# Patient Record
Sex: Female | Born: 1940
Health system: Southern US, Community
[De-identification: ages and names within clinical notes are randomized; demographics above are authoritative.]

## PROBLEM LIST (undated history)

## (undated) DIAGNOSIS — E785 Hyperlipidemia, unspecified: Secondary | ICD-10-CM

## (undated) DIAGNOSIS — I5189 Other ill-defined heart diseases: Secondary | ICD-10-CM

## (undated) DIAGNOSIS — I480 Paroxysmal atrial fibrillation: Secondary | ICD-10-CM

## (undated) DIAGNOSIS — K922 Gastrointestinal hemorrhage, unspecified: Secondary | ICD-10-CM

## (undated) DIAGNOSIS — D649 Anemia, unspecified: Secondary | ICD-10-CM

## (undated) DIAGNOSIS — I43 Cardiomyopathy in diseases classified elsewhere: Secondary | ICD-10-CM

## (undated) DIAGNOSIS — I809 Phlebitis and thrombophlebitis of unspecified site: Secondary | ICD-10-CM

## (undated) DIAGNOSIS — I350 Nonrheumatic aortic (valve) stenosis: Secondary | ICD-10-CM

## (undated) DIAGNOSIS — I639 Cerebral infarction, unspecified: Secondary | ICD-10-CM

## (undated) DIAGNOSIS — M159 Polyosteoarthritis, unspecified: Secondary | ICD-10-CM

## (undated) DIAGNOSIS — K219 Gastro-esophageal reflux disease without esophagitis: Secondary | ICD-10-CM

## (undated) DIAGNOSIS — H269 Unspecified cataract: Secondary | ICD-10-CM

## (undated) DIAGNOSIS — I10 Essential (primary) hypertension: Secondary | ICD-10-CM

## (undated) DIAGNOSIS — E039 Hypothyroidism, unspecified: Secondary | ICD-10-CM

## (undated) DIAGNOSIS — Z952 Presence of prosthetic heart valve: Secondary | ICD-10-CM

## (undated) DIAGNOSIS — I447 Left bundle-branch block, unspecified: Secondary | ICD-10-CM

## (undated) HISTORY — DX: Paroxysmal atrial fibrillation: I48.0

## (undated) HISTORY — PX: ABDOMINAL HYSTERECTOMY: SHX81

## (undated) HISTORY — PX: EYE SURGERY: SHX253

## (undated) HISTORY — DX: Other ill-defined heart diseases: I51.89

## (undated) HISTORY — PX: BREAST BIOPSY: SHX20

## (undated) HISTORY — DX: Nonrheumatic aortic (valve) stenosis: I35.0

## (undated) HISTORY — PX: COLONOSCOPY: SHX174

---
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
## 1999-03-16 ENCOUNTER — Ambulatory Visit (HOSPITAL_COMMUNITY): Admission: RE | Admit: 1999-03-16 | Discharge: 1999-03-16 | Payer: Self-pay | Admitting: Family Medicine

## 1999-04-12 ENCOUNTER — Ambulatory Visit (HOSPITAL_COMMUNITY): Admission: RE | Admit: 1999-04-12 | Discharge: 1999-04-12 | Payer: Self-pay | Admitting: Family Medicine

## 1999-04-12 ENCOUNTER — Encounter: Payer: Self-pay | Admitting: Family Medicine

## 2000-08-14 ENCOUNTER — Encounter: Payer: Self-pay | Admitting: Family Medicine

## 2000-08-14 ENCOUNTER — Ambulatory Visit (HOSPITAL_COMMUNITY): Admission: RE | Admit: 2000-08-14 | Discharge: 2000-08-14 | Payer: Self-pay | Admitting: Family Medicine

## 2002-01-05 ENCOUNTER — Other Ambulatory Visit: Admission: RE | Admit: 2002-01-05 | Discharge: 2002-01-05 | Payer: Self-pay | Admitting: Family Medicine

## 2002-01-15 ENCOUNTER — Ambulatory Visit (HOSPITAL_COMMUNITY): Admission: RE | Admit: 2002-01-15 | Discharge: 2002-01-15 | Payer: Self-pay | Admitting: Family Medicine

## 2002-01-15 ENCOUNTER — Encounter: Payer: Self-pay | Admitting: Family Medicine

## 2003-01-25 ENCOUNTER — Encounter: Payer: Self-pay | Admitting: Family Medicine

## 2003-01-25 ENCOUNTER — Ambulatory Visit (HOSPITAL_COMMUNITY): Admission: RE | Admit: 2003-01-25 | Discharge: 2003-01-25 | Payer: Self-pay | Admitting: Family Medicine

## 2006-10-08 DIAGNOSIS — I639 Cerebral infarction, unspecified: Secondary | ICD-10-CM

## 2006-10-08 HISTORY — DX: Cerebral infarction, unspecified: I63.9

## 2007-01-22 ENCOUNTER — Inpatient Hospital Stay (HOSPITAL_COMMUNITY): Admission: EM | Admit: 2007-01-22 | Discharge: 2007-01-27 | Payer: Self-pay | Admitting: Emergency Medicine

## 2007-01-23 ENCOUNTER — Encounter: Payer: Self-pay | Admitting: Cardiology

## 2007-02-13 ENCOUNTER — Other Ambulatory Visit: Admission: RE | Admit: 2007-02-13 | Discharge: 2007-02-13 | Payer: Self-pay | Admitting: Family Medicine

## 2007-02-19 ENCOUNTER — Ambulatory Visit (HOSPITAL_COMMUNITY): Admission: RE | Admit: 2007-02-19 | Discharge: 2007-02-19 | Payer: Self-pay | Admitting: Family Medicine

## 2007-04-27 ENCOUNTER — Emergency Department (HOSPITAL_COMMUNITY): Admission: EM | Admit: 2007-04-27 | Discharge: 2007-04-27 | Payer: Self-pay | Admitting: *Deleted

## 2007-09-09 ENCOUNTER — Emergency Department (HOSPITAL_COMMUNITY): Admission: EM | Admit: 2007-09-09 | Discharge: 2007-09-09 | Payer: Self-pay | Admitting: Emergency Medicine

## 2009-02-10 ENCOUNTER — Encounter (INDEPENDENT_AMBULATORY_CARE_PROVIDER_SITE_OTHER): Payer: Self-pay | Admitting: Cardiology

## 2009-02-10 ENCOUNTER — Ambulatory Visit (HOSPITAL_COMMUNITY): Admission: RE | Admit: 2009-02-10 | Discharge: 2009-02-10 | Payer: Self-pay | Admitting: Cardiology

## 2009-02-15 ENCOUNTER — Ambulatory Visit: Payer: Self-pay | Admitting: Surgery

## 2009-02-23 ENCOUNTER — Ambulatory Visit (HOSPITAL_COMMUNITY): Admission: RE | Admit: 2009-02-23 | Discharge: 2009-02-23 | Payer: Self-pay | Admitting: Surgery

## 2009-03-08 ENCOUNTER — Ambulatory Visit: Payer: Self-pay | Admitting: Surgery

## 2009-04-19 ENCOUNTER — Ambulatory Visit: Payer: Self-pay | Admitting: Surgery

## 2009-10-29 ENCOUNTER — Inpatient Hospital Stay (HOSPITAL_COMMUNITY): Admission: EM | Admit: 2009-10-29 | Discharge: 2009-10-31 | Payer: Self-pay | Admitting: Emergency Medicine

## 2009-10-30 ENCOUNTER — Encounter (INDEPENDENT_AMBULATORY_CARE_PROVIDER_SITE_OTHER): Payer: Self-pay | Admitting: Interventional Cardiology

## 2010-02-20 ENCOUNTER — Ambulatory Visit (HOSPITAL_COMMUNITY): Admission: RE | Admit: 2010-02-20 | Discharge: 2010-02-20 | Payer: Self-pay | Admitting: Family Medicine

## 2010-04-15 ENCOUNTER — Emergency Department (HOSPITAL_COMMUNITY): Admission: EM | Admit: 2010-04-15 | Discharge: 2010-04-16 | Payer: Self-pay | Admitting: Emergency Medicine

## 2010-10-29 ENCOUNTER — Encounter: Payer: Self-pay | Admitting: Neurology

## 2010-12-24 LAB — CBC
HCT: 36.7 % (ref 36.0–46.0)
HCT: 36.2 % (ref 36.0–46.0)
HCT: 36.2 % (ref 36.0–46.0)
Hemoglobin: 10.7 g/dL — ABNORMAL LOW (ref 12.0–15.0)
Hemoglobin: 12 g/dL (ref 12.0–15.0)
MCH: 25.7 pg — ABNORMAL LOW (ref 26.0–34.0)
MCHC: 33 g/dL (ref 30.0–36.0)
MCV: 77.8 fL — ABNORMAL LOW (ref 78.0–100.0)
Platelets: 310 10*3/uL (ref 150–400)
Platelets: 272 10*3/uL (ref 150–400)
Platelets: 276 10*3/uL (ref 150–400)
RBC: 4.59 MIL/uL (ref 3.87–5.11)
RBC: 4.66 MIL/uL (ref 3.87–5.11)
RDW: 15.9 % — ABNORMAL HIGH (ref 11.5–15.5)
RDW: 16 % — ABNORMAL HIGH (ref 11.5–15.5)
WBC: 9.6 10*3/uL (ref 4.0–10.5)
WBC: 6.8 10*3/uL (ref 4.0–10.5)
WBC: 9.4 10*3/uL (ref 4.0–10.5)

## 2010-12-24 LAB — POCT I-STAT 3, ART BLOOD GAS (G3+)
Acid-Base Excess: 3 mmol/L — ABNORMAL HIGH (ref 0.0–2.0)
Bicarbonate: 28.2 mEq/L — ABNORMAL HIGH (ref 20.0–24.0)
O2 Saturation: 94 %
TCO2: 30 mmol/L (ref 0–100)
pCO2 arterial: 45.3 mmHg — ABNORMAL HIGH (ref 35.0–45.0)
pH, Arterial: 7.402 — ABNORMAL HIGH (ref 7.350–7.400)
pO2, Arterial: 74 mmHg — ABNORMAL LOW (ref 80.0–100.0)

## 2010-12-24 LAB — CK TOTAL AND CKMB (NOT AT ARMC)
CK, MB: 2.9 ng/mL (ref 0.3–4.0)
CK, MB: 6.2 ng/mL (ref 0.3–4.0)
Relative Index: 5.9 — ABNORMAL HIGH (ref 0.0–2.5)
Total CK: 105 U/L (ref 7–177)
Total CK: 130 U/L (ref 7–177)
Total CK: 71 U/L (ref 7–177)

## 2010-12-24 LAB — LIPID PANEL
Cholesterol: 156 mg/dL (ref 0–200)
HDL: 50 mg/dL (ref >39)
LDL Cholesterol: 99 mg/dL (ref 0–99)
Total CHOL/HDL Ratio: 3.1 RATIO
Triglycerides: 37 mg/dL (ref <150)
VLDL: 7 mg/dL (ref 0–40)

## 2010-12-24 LAB — C-REACTIVE PROTEIN: CRP: 4.5 mg/dL — ABNORMAL HIGH (ref <0.6)

## 2010-12-24 LAB — POCT I-STAT, CHEM 8
BUN: 14 mg/dL (ref 6–23)
Calcium, Ion: 1.3 mmol/L (ref 1.12–1.32)
Sodium: 140 mEq/L (ref 135–145)
TCO2: 34 mmol/L (ref 0–100)

## 2010-12-24 LAB — DIFFERENTIAL
Basophils Absolute: 0 10*3/uL (ref 0.0–0.1)
Basophils Relative: 0 % (ref 0–1)
Basophils Relative: 0 % (ref 0–1)
Eosinophils Absolute: 0.2 10*3/uL (ref 0.0–0.7)
Eosinophils Absolute: 0.1 10*3/uL (ref 0.0–0.7)
Eosinophils Relative: 2 % (ref 0–5)
Lymphocytes Relative: 29 % (ref 12–46)
Monocytes Absolute: 0.4 10*3/uL (ref 0.1–1.0)
Monocytes Relative: 5 % (ref 3–12)
Neutrophils Relative %: 64 % (ref 43–77)

## 2010-12-24 LAB — URINALYSIS, ROUTINE W REFLEX MICROSCOPIC
Bilirubin Urine: NEGATIVE
Glucose, UA: NEGATIVE mg/dL
Glucose, UA: NEGATIVE mg/dL
Hgb urine dipstick: NEGATIVE
Hgb urine dipstick: NEGATIVE
Ketones, ur: NEGATIVE mg/dL
Nitrite: POSITIVE — AB
Protein, ur: NEGATIVE mg/dL
Protein, ur: NEGATIVE mg/dL
Specific Gravity, Urine: 1.016 (ref 1.005–1.030)
Urobilinogen, UA: 0.2 mg/dL (ref 0.0–1.0)
pH: 6 (ref 5.0–8.0)

## 2010-12-24 LAB — HEPARIN LEVEL (UNFRACTIONATED)
Heparin Unfractionated: 0.28 IU/mL — ABNORMAL LOW (ref 0.30–0.70)
Heparin Unfractionated: 0.37 IU/mL (ref 0.30–0.70)

## 2010-12-24 LAB — BASIC METABOLIC PANEL
CO2: 32 mEq/L (ref 19–32)
GFR calc Af Amer: >60 mL/min (ref >60)
GFR calc non Af Amer: >60 mL/min (ref >60)
Glucose, Bld: 112 mg/dL — ABNORMAL HIGH (ref 70–99)
Sodium: 141 mEq/L (ref 135–145)

## 2010-12-24 LAB — RPR: RPR Ser Ql: NONREACTIVE

## 2010-12-24 LAB — COMPREHENSIVE METABOLIC PANEL
Albumin: 3.5 g/dL (ref 3.5–5.2)
BUN: 7 mg/dL (ref 6–23)
Creatinine, Ser: 0.63 mg/dL (ref 0.4–1.2)
GFR calc Af Amer: >60 mL/min (ref >60)
Potassium: 3.3 mEq/L — ABNORMAL LOW (ref 3.5–5.1)
Sodium: 140 mEq/L (ref 135–145)
Total Bilirubin: 0.9 mg/dL (ref 0.3–1.2)

## 2010-12-24 LAB — TSH: TSH: 1.063 u[IU]/mL (ref 0.350–4.500)

## 2010-12-24 LAB — POCT I-STAT 3, VENOUS BLOOD GAS (G3P V)
Acid-Base Excess: 3 mmol/L — ABNORMAL HIGH (ref 0.0–2.0)
Bicarbonate: 27 mEq/L — ABNORMAL HIGH (ref 20.0–24.0)
O2 Saturation: 66 %
TCO2: 28 mmol/L (ref 0–100)
TCO2: 31 mmol/L (ref 0–100)

## 2010-12-24 LAB — APTT: aPTT: 137 seconds — ABNORMAL HIGH (ref 24–37)

## 2010-12-24 LAB — URINE MICROSCOPIC-ADD ON

## 2010-12-24 LAB — POCT CARDIAC MARKERS

## 2010-12-24 LAB — TROPONIN I: Troponin I: 1.2 ng/mL (ref 0.00–0.06)

## 2010-12-24 LAB — SEDIMENTATION RATE: Sed Rate: 52 mm/hr — ABNORMAL HIGH (ref 0–22)

## 2010-12-24 LAB — PROTIME-INR
INR: 1.2 (ref 0.00–1.49)
Prothrombin Time: 15.1 seconds (ref 11.6–15.2)

## 2010-12-24 LAB — D-DIMER, QUANTITATIVE: D-Dimer, Quant: 0.27 ug/mL-FEU (ref 0.00–0.48)

## 2011-02-20 NOTE — Consult Note (Signed)
NEW PATIENT CONSULTATION   Janet Williamson, Janet Williamson  DOB:  Nov 08, 1940                                        Feb 15, 2009  CHART #:  [id number]   REFERRING PHYSICIAN:  Veverly Fells. Skains, MD   REASON FOR CONSULTATION:  Mitral valve mass with history of stroke.   CLINICAL HISTORY:  I was asked by Dr. Anne Fu to evaluate the patient for  a mass that appeared to be on the ventricular surface of the anterior  mitral valve leaflets.  She is a 70 year old woman with a known history  of aortic stenosis and heart murmur, who suffered a stroke in 2008.  MRI  at that time showed an acute left thalamic stroke.  This manifested as  numbness and weakness in her right arm and leg.  He said that the  symptoms improved, but she still has some numbness in her right hand and  occasionally drags her right foot.  Workup at that time included a  transthoracic echocardiogram as well as a TEE.  There was no embolic  source found for her stroke.  She said that she recently presented to  her primary physician Dr. Maurice Small and was noted to have a change  in her heart murmur and was referred to Dr. Anne Fu for further workup.  She underwent a transthoracic echocardiogram on January 20, 2009, which  showed a calcified mass on the ventricular surface of the anterior  mitral leaflet and annulus adjacent to the left ventricular outflow  tract.  This measured about 1.5 x 1.5 cm and appeared to be fixed  without motion.  Ejection fraction was 65%-70%.  There were some mild  aortic insufficiency and mild-to-moderate aortic stenosis with a peak  gradient of 2.86 meters per second and a mean gradient of 90 mmHg.  Valve area was calculated as 1.2 cm2.  There is mild mitral  regurgitation.  She subsequently underwent a transesophageal  echocardiogram on Feb 10, 2009.  This again showed mild-to-moderate  aortic insufficiency with a mild-to-moderate aortic stenosis.  There is  a 1.3 x 1.3 cm calcified mass on  the ventricular surface of the anterior  mitral leaflet and mitral annulus encroaching on the left ventricular  outflow tract.  This appeared adjacent to the fibrous portion of the  heart between the mitral and aortic annulus.  The mitral valve leaflets  overall appeared of good motion and normal thickness.  There is mild  mitral regurgitation.   REVIEW OF SYSTEMS:  GENERAL:  She denies any fever or chills.  She has  had no weight loss.  She reports normal appetite.  She does report some  fatigue.  EYES:  Negative.  ENT:  Negative.  ENDOCRINE:  She denies diabetes and hypothyroidism.  CARDIOVASCULAR:  She denies any chest pain or pressure.  She does report  some palpitations.  She has had some orthopnea.  RESPIRATORY:  She  denies cough and sputum production.  GI:  She has had no nausea or vomiting.  She denies melena and bright  red blood per rectum.  GU:  She denies dysuria and hematuria.  MUSCULOSKELETAL:  She does have a history of arthritis.  NEUROLOGIC:  She denies any focal weakness, although she reports  dragging of her right leg at times.  She still has some numbness in her  right hand.  She denies any headaches or visual changes.  She has had no  dizziness or syncope.  VASCULAR:  She denies history of claudication or phlebitis.  HEMATOLOGIC:  Negative.  PSYCHIATRIC:  Negative.   ALLERGIES:  None.   MEDICATIONS:  1. Plavix 75 mg daily.  2. Aspirin 81 mg daily.  3. Lopressor 50 mg daily.  4. Hydrochlorothiazide 25 mg daily.  5. Altace 5 mg daily.  6. Zocor 40 mg daily.  7. Tylenol p.r.n.  8. Vitamin D 1000 units daily.  9. Calcium 600 mg daily.  10.Iron daily.   PAST MEDICAL HISTORY:  Significant for history of hypercholesterolemia.  She has history of stroke in 2008.  She has history of hypertension.  She has history of known aortic stenosis.  She is status post excision  of a left breast nodule in 1996.  She is status post excision of a  thyroglossal duct cyst  in 1996.   SOCIAL HISTORY:  She works as a Conservation officer, nature at [employer].  She does not smoke  and denies alcohol use.   FAMILY HISTORY:  Positive for stroke in her mother who also had diabetes  and hypertension.   PHYSICAL EXAMINATION:  VITAL SIGNS:  Blood pressure is 176/83, pulse is  62 and regular.  Respiratory rate is 18 and unlabored.  Oxygen  saturation on room air is 98%.  GENERAL:  She is a well-developed black female in no distress.  HEENT:  Normocephalic and atraumatic.  Pupils are equal and reactive to  light and accommodation.  Extraocular muscles are intact.  Throat is  clear.  NECK:  Normal carotid pulses bilaterally.  There are no bruits.  There  is no adenopathy or thyromegaly.  CARDIAC:  Regular rate and rhythm with a grade 3/6 systolic murmur heard  throughout the precordium.  There is no diastolic murmur.  LUNGS:  Clear.  ABDOMEN:  Active bowel sounds.  Abdomen is soft and nontender.  There  are no palpable masses or organomegaly.  EXTREMITIES:  No peripheral edema.  Pedal pulses are palpable  bilaterally.  SKIN:  Warm and dry.  NEUROLOGIC:  Alert and oriented x3.  Motor and sensory exams grossly  normal.   IMPRESSION:  The patient has a 1.3 x 1.3 cm ovoid calcified mass located  adjacent to the anterior mitral annulus and aortic annulus with  encroachment on the left ventricular outflow tract.  She has mild-to-  moderate aortic stenosis and insufficiency and mild mitral  regurgitation.  I have reviewed her transesophageal echocardiogram from  2008, and there were certainly no masses at that time.  There was slight  calcification of the same area, which is located in the fiber skeleton  between the mitral and aortic valves.  The mass is in an unusual  location for a myxoma.  It certainly could be a papilloma or fibroma.  Also, think it is unusual that a mass would develop to this size over  the past 2 years with calcification.  I recommended that we obtain a  cardiac  MRI to try to better define the relationship of this mass to the  surrounding structures.  This is a very difficult area to excise a  cardiac mass due to its proximity to the mitral annulus and anterior  mitral leaflet as well as the aortic annulus.  She has mild-to-moderate  aortic insufficiency, so that any surgical resection of this mass would  require aortic valve replacement.  Given the size of this mass, I think  it is likely that her mitral valve would also have to be replaced.  I  think most of the anterior mitral leaflet would have to be resected with  the mass.  I am not sure that this mass is the cause of her stroke in  2008, although I guess this is possible she had had a small tumor at  that time that did not show up on echocardiogram or may have had some  thrombus form over an area of irregularity in this region.  I do not  think there is any urgency to resect this mass and we are certainly like  to have as much information as possible before making that decision.  She will have a cardiac MRI scheduled next week or two, and I will see  her back in the office afterwards to discuss the results with her and  make further recommendations at that time.   Evelene Croon, M.D.  Electronically Signed   BB/MEDQ  D:  02/15/2009  T:  02/16/2009  Job:  [id number]   cc:   Jake Bathe, MD  Gretta Arab Valentina Lucks, M.D.

## 2011-02-20 NOTE — Assessment & Plan Note (Signed)
OFFICE VISIT   Janet Williamson, Janet Williamson  DOB:  [birth year]                                        April 19, 2009  CHART #:  [id number]   The patient returns today for followup of a calcified mass adjacent to  the anterior mitral leaflet and mitral annulus anteriorly seen on  previous echocardiogram from Feb 10, 2009.  Since I last saw her on March 08, 2009, she has not been feeling well.  She has no chest pain or  shortness of breath and no other complaints.   PHYSICAL EXAMINATION:  VITAL SIGNS:  Blood pressure 159/80, her pulse is  68 and regular.  Respiratory rate is 16 and unlabored.  Saturation is  97% on room air.  GENERAL:  She looks well.  CARDIAC:  Regular rate and rhythm with a grade 3/6 systolic murmur,  loudest over the aorta but heard throughout the precordium.  There is no  diastolic murmur.   IMPRESSION:  I have reviewed the patient's echocardiogram from Feb 10, 2009, as well as her cardiac MR.  I have reviewed these with some of my  partners and discussed it with them.  I think it was the consensus that  this may be a benign calcified mass that is not neoplastic.  Occasionally, we do see collections of calcified soft debris around the  mitral annulus.  The etiology of this is unclear, but we occasionally  see it when we are performing a mitral valve replacement or repair.  I  think this could also be a calcified benign neoplasm, although I think  would be unusual to see a calcified neoplasm develop for this size in a  2-year period since it was not visible in 2008.  Review of her previous  echocardiograms from 2008 did show slight amount of calcification in the  mitral annulus in the same location.  Since she is doing well clinically  and has no other indication for surgery at this time, I think it would  be best to continue following this for awhile to look for any changes.  I have recommended that we repeat TEE in about 6 months to follow up on  this abnormality.  I will discuss this further with Dr. Anne Fu and we  will plan on  doing a TEE 6 months after her initial scan.  I discussed this with the  patient and she is in full agreement.   Evelene Croon, M.D.  Electronically Signed   BB/MEDQ  D:  04/19/2009  T:  04/20/2009  Job:  [id number]

## 2011-02-20 NOTE — Assessment & Plan Note (Signed)
OFFICE VISIT   CHARLETTE, HENNINGS  DOB:  [birth year]                                        March 08, 2009  CHART #:  [id number]   The patient returns today for followup having undergone cardiac MR to  better characterize a mass adjacent to her mitral valve seen by  echocardiogram.  She has had no change in her physical conditions since  I last saw her on Feb 15, 2009.   PHYSICAL EXAMINATION:  VITAL SIGNS:  Her blood pressure is 130/68 and  her pulse is 68 and regular.  Respiratory rate is 18 and unlabored.  Oxygen saturation is 98% on room air.  GENERAL:  She looks well.  CARDIAC:  Regular rate and rhythm with a grade 3/6 systolic murmur heard  throughout the precordium.  There is no diastolic murmur.  LUNGS:  Clear.   Review of her cardiac MR shows a 1.6 x 1.2 x 1.7 cm nonenhancing mass  along the ventricular border of the anterior leaflet of mitral valve.  This does not appear to be an invasive mass.  Etiology is indeterminate.  There was some mild mitral valve regurgitation.  Ejection fraction was  69.5%.   IMPRESSION:  The patient has a calcified mass along the ventricular  border of the anterior leaflet of the mitral valve adjacent to the  aortic valve.  She has mild-to-moderate aortic insufficiency and mild-to-  moderate aortic stenosis by echocardiogram.  She is currently  asymptomatic.  The etiology of this calcified mass is unclear, but was  not present by echocardiogram in 2008.  This could be a calcified  fibroelastoma or papilloma.  It is an unusual location for a myxoma.  It  is also possible this could represent an organized vegetation or  thrombus.  Since she is asymptomatic and this is a very difficult area  to resect a mass, I recommended that we follow this for awhile.  I will  plan to see her back in 6 weeks to see how she is doing and at that time  we may decide to do a repeat transesophageal echocardiogram to  look for any increase in  size of this mass.  I will review her  echocardiogram with my colleagues to get any further opinions about  surgical treatment of this.   Evelene Croon, M.D.  Electronically Signed   BB/MEDQ  D:  03/08/2009  T:  03/09/2009  Job:  [id number]   cc:   Jake Bathe, MD  Gretta Arab Valentina Lucks, M.D.

## 2011-02-23 NOTE — Discharge Summary (Signed)
Janet Williamson, Janet Williamson             ACCOUNT NO.:  [id number]   MEDICAL RECORD NO.:  [id number]          PATIENT TYPE:  INP   LOCATION:  3030                         FACILITY:  [facility]   PHYSICIAN:  Pramod P. Pearlean Brownie, MD    DATE OF BIRTH:  1941/04/21   DATE OF ADMISSION:  01/23/2007  DATE OF DISCHARGE:  01/27/2007                               DISCHARGE SUMMARY   DIAGNOSIS AT TIME OF DISCHARGE:  1. Bicerebral infarct secondary to small vessel disease.  2. Hypertension.  3. Mild atrial stenosis with heart murmur.  4. Remote hysterectomy.  5. Dyslipidemia.   MEDICINE AT TIME OF DISCHARGE:  1. Hydrochlorothiazide 25 mg a day.  2. Aspirin 81 mg a day for 2 weeks then stop.  3. Aggrenox one q.h.s. x2 weeks then increase to b.i.d.  4. Zocor 20 mg at bedtime.  5. Tylenol two tablets 30 minutes before Aggrenox dose x1 week only.   STUDIES PERFORMED:  1. CT of the brain on admission showed no acute abnormality, air-fluid      level in the right sphenoid sinus.  2. MRI of the brain shows 1 cm area of acute ischemia involving the      lateral aspect of the left thalamus.  Locational nonspecific      subcortical white matter disease supratentorially probably related      to small vessel disease due to hypertension and her diabetes.      Moderate sinus disease involving the ethmoid air cells and frontal      sinuses.  3. MRA of the head showed significant signal drop off of the left      internal carotid artery __________  cavernous junction associated      with an irregular outpouching projecting medially.  Question small      focal aneurysm versus a dissection with associated small      pseudoaneurysm.  Significant signal drop off of the proximal      basilar artery suggestive of high-grade hemodynamically significant      stenosis.  4. A 2-D echocardiogram shows EF of 60-65% with no left ventricular      regional wall motion abnormalities.  Aortic valve thickness was      moderately  increased with moderate calcification.  There was      reduced leaflet excursion and findings consistent with mild aortic      valve stenosis and mild aortic valvular regurgitation.  There was a      mobile structure on the atrial side of the mitral valve noted in      the apical views only.  This is likely a reverberation from a      leaflet calcium but a vegetation cannot be excluded.  5. Transesophageal echocardiogram.  Transesophageal echocardiogram      shows no clot or PFO, no mitral valve abnormality, performed by Dr.      Corliss Marcus.  6. Carotid Doppler.  Carotid Doppler results are pending at time of      this dictation.   LABORATORY STUDIES:  CBC with MCV low 76.6, hemoglobin 12.0.  Chemistry  with  glucose 108, otherwise normal.  Coagulation studies normal.  Liver  function tests normal.  Albumin 3.1.  Cardiac enzymes negative.  Cholesterol 181, triglycerides 68, HDL 38 and LDL 129, calcium 9.3.  Urinalysis normal.  Homocystine 6.5.  Hemoglobin A1c 6.8.   HISTORY OF PRESENT ILLNESS:  Janet Williamson is a 70 year old right-handed  black female with a history of untreated hypertension.  She works at [employer] and was getting off work at  [employer].  She went to pick up some objects  and check out and noticed that she kept dropping things out of her right  hand and felt funny.  She also notes that her right face felt numb and  that her speech was slurred.  She kept dropping things in the right hand  and felt clumsy and was stumbling.  As she came out of the store, her  husband who was picking her up noticed she was stumbling and could walk  right and drove her directly over to the [facility] Emergency Room  where she was evaluated by Dr. Marcelino Freestone with a negative CT.  She was given full-dose intravenous t-PA and admitted to the ICU.   HOSPITAL COURSE:  The patient was admitted to the ICU at [facility]  then after 24 hours post t-PA when CT was stable and bleeding risk was   low was transferred to [facility].  As the patient had  bicerebral infarct, it was felt she may potentially have embolic source.  A 2-D echocardiogram was performed which showed a possible mass.  Transesophageal echocardiogram was performed that ruled out this mass  and no embolic source.  She was found to have hypertension and lipids  that were not treated and she was put on hydrochlorothiazide and Zocor  for these.  She was also placed on Aggrenox for secondary stroke  prevention.  She was evaluated by PT and OT and felt to have minimal  deficits but would benefit from short-term home health followup.  At  this point the patient is ready for discharge.   CONDITION ON DISCHARGE:  The patient alert and oriented x3.  No aphasia  or dysarthria.  Eye movements are full.  Face is symmetric.  She has no  drift.  She has symmetric strength and no focal deficit.   DISCHARGE/PLAN:  1. Discharge home with family.  2. Home health PT and OT with tub bench.  3. Aggrenox for secondary stroke prevention.  4. New Zocor for lipids.  Will need followup in 4-6 weeks.  Follow up      blood pressure since on new antihypertensive.  5. Follow up with Dr. Maurice Small within 1 month.  6. Follow up with Dr. Delia Heady in 2-3 months.      Janet Williamson, N.P.    ______________________________  Janet Williamson. Pearlean Brownie, MD    SB/MEDQ  D:  01/27/2007  T:  01/27/2007  Job:  [id number]   cc:   Gretta Arab. Valentina Lucks, M.D.

## 2011-02-23 NOTE — H&P (Signed)
NAMEMARKESIA, CRILLY             ACCOUNT NO.:  [id number]   MEDICAL RECORD NO.:  [id number]          PATIENT TYPE:  INP   LOCATION:  1222                         FACILITY:  [facility]   PHYSICIAN:  Gustavus Messing. Orlin Hilding, M.D.DATE OF BIRTH:  27-Nov-1940   DATE OF ADMISSION:  01/22/2007  DATE OF DISCHARGE:                              HISTORY & PHYSICAL   This was a code stroke called at 1540.  She was last seen normal at  1435.   CHIEF COMPLAINT:  Stroke, right-sided weakness.   HISTORY OF PRESENT ILLNESS:  Ms. Joffe is a 70 year old, right-  handed, black woman with a history of untreated hypertension.  She works  at [employer] and was getting off work at [employer].  She went to pick up some  objects and check out and noticed that she kept dropping things out of  her right hand and felt funny.  She also noticed that her right face  felt numb and that her speech was slurred.  She kept dropping things in  the right hand and felt clumsy, and was stumbling.  As she came out of  the store, her husband, who was picking her up, noticed that she was  stumbling and that she was not right and drove her directly over to the  [facility] Emergency Room.   REVIEW OF SYSTEMS:  Negative for chest pain, shortness of breath,  headache, dizziness, vision changes, GI or GU bleeding or pain.   PAST MEDICAL HISTORY:  1. Hypertension.  2. Heart murmur.  3. Remote hysterectomy.   She denies diabetes, hyperlipidemia, heart disease or previous stroke.   MEDICATIONS:  None at all currently.   ALLERGIES:  No known drug allergies.   SOCIAL HISTORY:  She is married.  She has eight living children, one  deceased.  No cigarette or alcohol use.  She works at [employer] as a  Conservation officer, nature.   FAMILY HISTORY:  Noncontributory.   PHYSICAL EXAMINATION:  VITAL SIGNS:  Temperature 98.2.  Respirations 16.  Heart rate 86.  Blood pressure has been varying in the 180 to 237 range  in the systolic and 78 to 131 diastolic.  She  has not had any sustained  elevated pressures above 185.  HEAD:  Normocephalic, atraumatic.  NECK:  Supple without bruits.  HEART:  Regular rate and rhythm with a murmur.  LUNGS:  Clear to auscultation.  EXTREMITIES:  Without edema.   The NIH Stroke Scale score is 4.  She gets one point for facial  numbness, one point for facial droop, one point for right lower  extremity drift, and one point for dysarthria.  Otherwise, she is awake,  alert, and appropriate with normal language and cognition.  Cranial  nerves- her pupils are equal and reactive.  Visual fields are full to  confrontation.  Extraocular movements are intact.  Facial sensation  shows some decreased sensation on the right.  Facial motor activity  shows she has a central right facial droop.  Hearing is intact.  Palate  is symmetric and tongue is midline.  On motor exam, she has a very  slight drift  of the right lower extremity only.  She clearly has some  clumsiness, however, of the right upper extremity and lower extremity,  although strength appears to be approximately 5/5.  No fasciculations,  atrophy, or tremor. Reflex exam:  Deep tendon reflexes diminished  diffusely.  She has downgoing toes to plantar stimulation.  Coordination:  Finger-to-nose and heel-to-shin are well performed.  She  is a little slower on the right and perhaps slightly more clumsy but no  true dysmetria is noted.  Sensory exam in the extremities is normal  without any asymmetry.  She does have the facial numbness.   CT of the brain was negative for acute abnormalities or indeed any other  clear stroke.  She has a small air fluid level in the right sphenoid  sinus.  Glucose is 107.  PT is 14.7 with an INR of 1.1.  Platelets of  316.   IMPRESSION:  Suspect left brain stroke, probably small vessel.  [employer] [facility] Stroke Score is minimal at 4 but it does affect her  speech with a dysarthria and her dominant right side.  Blood pressure  is  borderline for tPA with systolics ranging from 178 to 237 but it has not  been sustained.   PLAN:  Given the above, as she is in the time window, I will initiate  labetalol and then give her a full dose intravenous tPA.  We will admit  her to [facility] stroke service for workup to include MRI, carotid Doppler,  and 2D echo.      Catherine A. Orlin Hilding, M.D.  Electronically Signed     CAW/MEDQ  D:  01/22/2007  T:  01/22/2007  Job:  [id number]

## 2011-02-23 NOTE — Consult Note (Signed)
Janet Williamson, Janet Williamson             ACCOUNT NO.:  [id number]   MEDICAL RECORD NO.:  [id number]          PATIENT TYPE:  INP   LOCATION:  3030                         FACILITY:  [facility]   PHYSICIAN:  Francisca December, M.D.  DATE OF BIRTH:  1941-06-03   DATE OF CONSULTATION:  01/25/2007  DATE OF DISCHARGE:                                 CONSULTATION   CARDIAC CONSULTATION:   REASON FOR CONSULTATION:  Abnormal mitral valve, recent CVA.   FINDINGS:  1. Left brain CVA/TPA on presentation.  2. Abnormal mitral valve mobile lesion, atrial side posterior leaflet,      ? calcification with reverberation versus mass.  3. History of untreated hypertension.  4. History of heart murmur, mild AS by examination.   RECOMMENDATIONS:  1. We will arrange for TEE on April 21.  2. N.P.O. post midnight, April 20.  3. Usual post CVA treatment by stroke team.   HISTORY OF PRESENT ILLNESS:  Janet Williamson is a 70 year old right-  handed African American woman with a history of untreated hypertension.  On January 22, 2007 she had the spontaneous onset of right-hand clumsiness  and facial numbness and tingling.  Her speech was somewhat slurred.  She  was dropping things.  She was in the store with her husband, was driven  directly to the emergency room.  There, she was evaluated by the stroke  team and received TPA.  The initial CT scan of the brain was negative  for any acute abnormalities or clear stroke.  Because the stroke was  affecting her dominant right side and she had dysarthria, Dr. Orlin Hilding  elected to initiate TPA.   Since admission she has rapidly progressed and at the time of my  evaluation has little or nothing in the way of residual effect.  Her  2-D echocardiogram was as noted above, and I requested to evaluate for  considered TEE.  She has no recent fevers, chills or constitutional  symptoms suggestive of endocarditis.   PAST MEDICAL HISTORY:  1. Hypertension.  2. Heart murmur.  3. Remote hysterectomy.  4. She denies any history of diabetes, other heart disease,      hyperlipidemia or previous stroke.   MEDICATIONS:  None.   DRUG ALLERGIES:  NONE.   SOCIAL HISTORY:  She is married.  She has 8 living children, one of whom  is deceased.  She has no alcohol or tobacco use.  She works at [employer]  as a Conservation officer, nature.   FAMILY HISTORY:  Not significant for early coronary disease.   REVIEW OF SYSTEMS:  Negative except as mentioned above.   PHYSICAL EXAMINATION:  VITAL SIGNS:  Blood pressure is 170/78, pulse is  70 and regular, respiratory rate 18, temperature 98.4.  GENERAL:  In general this is a well-appearing 70 year old African  American woman in no distress.  HEENT:  Unremarkable.  NECK:  Her neck is supple without thyromegaly or masses.  The carotid  upstrokes are normal.  There is no bruit.  There is no JVD.  Carotid  impulses are full and non-delayed.  CHEST:  The chest is clear with  adequate excursion.  HEART:  The heart has regular rhythm.  Normal S1, soft S2.  There is a  loud grade 3/6 ejection systolic murmur mid to late peaking heard  throughout, loudest at the aortic area.  ABDOMEN:  Soft and nontender.  No midline pulsatile mass.  GENITALIA:  External genitalia atrophic.  RECTAL:  Not performed.  EXTREMITIES:  Show full range of motion.  No edema.  Intact distal  pulses.  NEUROLOGICAL:  I can detect no residual deficit.  SKIN:  Warm, dry and clear.   LABORATORY DATA:  Electrocardiogram:  Sinus rhythm.  Left atrial  abnormality.  LVH of old nonspecific T-wave abnormality.   Echocardiogram:  This showed overall intact LV systolic function with an  EF of 65%.  There was mild to moderate thickening of the left  ventricular walls in a concentric fashion.  The aortic valve was  trileaflet, thickened and calcified.  Reduced excursion.  Calculated  aortic valve area 1.5 cm2.  The mitral valve shows a mobile structure on  the atrial side of the mitral  valve noted in the apical views only.  Could not exclude vegetation/mass.   COMMENTS:  Certainly Janet Williamson needs initiation of appropriate  antihypertensive therapy.  I agree with your initiation of Aggrenox and  antilipemic therapy with simvastatin.  Hydrochlorothiazide has been  administered.  Based on her pressures so far in the hospital, it is  clear that she will likely require an additional medication.   I will arrange for a transesophageal echocardiogram as noted above, but  I suspect we will find no significant mitral valve lesion that may have  led to her CVA.   Thank you very much for allowing me to assist in the care of Mrs. Janet  Williamson.  It has been a pleasure to do so.  I will discuss her further  care with you.      Francisca December, M.D.  Electronically Signed    JHE/MEDQ  D:  01/25/2007  T:  01/26/2007  Job:  [id number]   cc:   Gretta Arab. Valentina Lucks, M.D.  Catherine A. Orlin Hilding, M.D.

## 2011-03-27 ENCOUNTER — Other Ambulatory Visit (HOSPITAL_COMMUNITY): Payer: Self-pay | Admitting: Family Medicine

## 2011-03-27 DIAGNOSIS — Z1231 Encounter for screening mammogram for malignant neoplasm of breast: Secondary | ICD-10-CM

## 2011-04-06 ENCOUNTER — Ambulatory Visit (HOSPITAL_COMMUNITY)
Admission: RE | Admit: 2011-04-06 | Discharge: 2011-04-06 | Disposition: A | Discharge status: Home / Self Care | Payer: PRIVATE HEALTH INSURANCE | Source: Ambulatory Visit | Attending: Family Medicine | Admitting: Family Medicine

## 2011-04-06 DIAGNOSIS — Z1231 Encounter for screening mammogram for malignant neoplasm of breast: Secondary | ICD-10-CM | POA: Insufficient documentation

## 2011-07-16 LAB — DIFFERENTIAL
Basophils Relative: 0
Eosinophils Absolute: 0.2
Eosinophils Relative: 2
Lymphs Abs: 2.8
Monocytes Absolute: 0.5
Monocytes Relative: 4

## 2011-07-16 LAB — CBC
HCT: 37.5
Hemoglobin: 12.3
MCHC: 32.7
MCV: 77.2 — ABNORMAL LOW
RBC: 4.85
WBC: 11.3 — ABNORMAL HIGH

## 2011-07-16 LAB — BASIC METABOLIC PANEL
BUN: 11
CO2: 31
Chloride: 99
Creatinine, Ser: 0.7
Glucose, Bld: 119 — ABNORMAL HIGH

## 2011-07-23 LAB — I-STAT 8, (EC8 V) (CONVERTED LAB)
Acid-Base Excess: 4 — ABNORMAL HIGH
Chloride: 103
HCT: 44
Operator id: 133351
Potassium: 3.1 — ABNORMAL LOW
TCO2: 29
pH, Ven: 7.469 — ABNORMAL HIGH

## 2011-07-23 LAB — POCT I-STAT CREATININE
Creatinine, Ser: 0.8
Operator id: 133351

## 2012-04-23 ENCOUNTER — Other Ambulatory Visit: Payer: Self-pay | Admitting: Endocrinology

## 2012-04-23 DIAGNOSIS — E049 Nontoxic goiter, unspecified: Secondary | ICD-10-CM

## 2012-05-05 ENCOUNTER — Other Ambulatory Visit: Payer: PRIVATE HEALTH INSURANCE

## 2012-06-03 ENCOUNTER — Ambulatory Visit
Admission: RE | Admit: 2012-06-03 | Discharge: 2012-06-03 | Disposition: A | Discharge status: Home / Self Care | Payer: Medicare Other | Source: Ambulatory Visit | Attending: Endocrinology | Admitting: Endocrinology

## 2012-06-03 DIAGNOSIS — E049 Nontoxic goiter, unspecified: Secondary | ICD-10-CM

## 2012-06-29 ENCOUNTER — Encounter (HOSPITAL_COMMUNITY): Payer: Self-pay | Admitting: *Deleted

## 2012-06-29 ENCOUNTER — Emergency Department (HOSPITAL_COMMUNITY)
Admission: EM | Admit: 2012-06-29 | Discharge: 2012-06-29 | Disposition: A | Discharge status: Home / Self Care | Payer: Medicare Other | Attending: Emergency Medicine | Admitting: Emergency Medicine

## 2012-06-29 DIAGNOSIS — M159 Polyosteoarthritis, unspecified: Secondary | ICD-10-CM | POA: Insufficient documentation

## 2012-06-29 DIAGNOSIS — S139XXA Sprain of joints and ligaments of unspecified parts of neck, initial encounter: Secondary | ICD-10-CM | POA: Insufficient documentation

## 2012-06-29 DIAGNOSIS — S161XXA Strain of muscle, fascia and tendon at neck level, initial encounter: Secondary | ICD-10-CM

## 2012-06-29 DIAGNOSIS — E119 Type 2 diabetes mellitus without complications: Secondary | ICD-10-CM | POA: Insufficient documentation

## 2012-06-29 DIAGNOSIS — I059 Rheumatic mitral valve disease, unspecified: Secondary | ICD-10-CM | POA: Insufficient documentation

## 2012-06-29 DIAGNOSIS — I10 Essential (primary) hypertension: Secondary | ICD-10-CM | POA: Insufficient documentation

## 2012-06-29 DIAGNOSIS — R51 Headache: Secondary | ICD-10-CM

## 2012-06-29 DIAGNOSIS — E7800 Pure hypercholesterolemia, unspecified: Secondary | ICD-10-CM | POA: Insufficient documentation

## 2012-06-29 DIAGNOSIS — Z8673 Personal history of transient ischemic attack (TIA), and cerebral infarction without residual deficits: Secondary | ICD-10-CM | POA: Insufficient documentation

## 2012-06-29 DIAGNOSIS — X58XXXA Exposure to other specified factors, initial encounter: Secondary | ICD-10-CM | POA: Insufficient documentation

## 2012-06-29 HISTORY — DX: Unspecified cataract: H26.9

## 2012-06-29 HISTORY — DX: Cardiomyopathy in diseases classified elsewhere: I43

## 2012-06-29 HISTORY — DX: Phlebitis and thrombophlebitis of unspecified site: I80.9

## 2012-06-29 HISTORY — DX: Cerebral infarction, unspecified: I63.9

## 2012-06-29 HISTORY — DX: Polyosteoarthritis, unspecified: M15.9

## 2012-06-29 HISTORY — DX: Essential (primary) hypertension: I10

## 2012-06-29 HISTORY — DX: Anemia, unspecified: D64.9

## 2012-06-29 MED ORDER — IBUPROFEN 200 MG PO TABS
400.0000 mg | ORAL_TABLET | Freq: Once | ORAL | Status: AC
  Administered 2012-06-29: 400 mg via ORAL
  Filled 2012-06-29: qty 2

## 2012-06-29 NOTE — ED Provider Notes (Signed)
History     CSN: [id number]  Arrival date & time 06/29/12  1022   First MD Initiated Contact with Patient 06/29/12 1049      Chief Complaint  Patient presents with  . Headache    (Consider location/radiation/quality/duration/timing/severity/associated sxs/prior treatment) HPI Comments: Pt with h/o prior stroke on right side s/p TPA,. Ischemic stroke a few years ago with resolution of symptoms, reports developed pain along posterior left side of neck and into back of head last night.  Woke severeal times over night, denies confusion, slurred speech, numbness or weakness.  Hurts to palpate on that side and also to rotate neck to left.  No N/V/D.  No changes in vision.  Denies fever, rash, head injury, neck injury.  Has not taken anything for meds.  Due to h/o stroke, she was concerned.    The history is provided by the patient and a relative.    Past Medical History  Diagnosis Date  . Diabetes mellitus   . Hypertension   . Elevated cholesterol   . Cardiomyopathy in other disease   . Mitral valve disorder   . Heart murmur   . Generalized osteoarthritis   . Anemia   . Phlebitis   . Thrombophlebitis   . Stroke   . Cataracts, bilateral     Past Surgical History  Procedure Date  . Abdominal hysterectomy   . Eye surgery     cataract removal    History reviewed. No pertinent family history.  History  Substance Use Topics  . Smoking status: Never Smoker   . Smokeless tobacco: Not on file  . Alcohol Use: No    OB History    Grav Para Term Preterm Abortions TAB SAB Ect Mult Living                  Review of Systems  Constitutional: Negative for fever and chills.  HENT: Positive for neck pain. Negative for congestion and neck stiffness.   Respiratory: Negative for shortness of breath.   Cardiovascular: Negative for chest pain.  Gastrointestinal: Negative for nausea and vomiting.  Musculoskeletal: Negative for back pain.  Skin: Negative for rash.  Neurological:  Positive for headaches. Negative for dizziness, syncope, speech difficulty, weakness, light-headedness and numbness.  All other systems reviewed and are negative.    Allergies  Review of patient's allergies indicates no known allergies.  Home Medications   Current Outpatient Rx  Name Route Sig Dispense Refill  . AMLODIPINE BESYLATE 5 MG PO TABS Oral Take 5 mg by mouth daily.    . ASPIRIN EC 81 MG PO TBEC Oral Take 81 mg by mouth daily.    Marland Kitchen CALCIUM 600 PO Oral Take 1 tablet by mouth daily.    Marland Kitchen VITAMIN D 1000 UNITS PO TABS Oral Take 1,000 Units by mouth daily.    Marland Kitchen CLOPIDOGREL BISULFATE 75 MG PO TABS Oral Take 75 mg by mouth daily.    Marland Kitchen HYDROCHLOROTHIAZIDE 25 MG PO TABS Oral Take 25 mg by mouth daily.    Marland Kitchen METOPROLOL SUCCINATE ER 50 MG PO TB24 Oral Take 50 mg by mouth daily. Take with or immediately following a meal.    . RAMIPRIL 10 MG PO TABS Oral Take 10 mg by mouth daily.    Marland Kitchen SIMVASTATIN 20 MG PO TABS Oral Take 20 mg by mouth every evening.      BP 145/66  Pulse 76  Temp 98.7 F (37.1 C) (Oral)  Resp 16  SpO2 98%  Physical  Exam  Nursing note and vitals reviewed. Constitutional: She is oriented to person, place, and time. She appears well-developed and well-nourished.  HENT:  Head: Normocephalic and atraumatic.  Eyes: Conjunctivae normal and EOM are normal. Pupils are equal, round, and reactive to light. Right eye exhibits no discharge. Left eye exhibits no discharge. No scleral icterus.  Neck: Normal range of motion, full passive range of motion without pain and phonation normal. Neck supple. No rigidity. No thyromegaly present.    Cardiovascular: Normal rate and regular rhythm.   Pulmonary/Chest: Effort normal.  Abdominal: Soft.  Musculoskeletal: She exhibits no edema.  Neurological: She is alert and oriented to person, place, and time. She has normal strength. No cranial nerve deficit. She exhibits normal muscle tone. Coordination and gait normal.  Skin: Skin is  warm and dry. No rash noted.  Psychiatric: She has a normal mood and affect.    ED Course  Procedures (including critical care time)  Labs Reviewed - No data to display No results found.   1. Headache   2. Neck strain       MDM  Pt with no focal deficits, no visual changes, no slurred speech.  No pain over temporal artery, no subj changes to vision, speech.  Good coordination here.  No stiff neck or meningieal signs.  Discussed at length with pt.  Old records shows she had MRI and CT's a few years ago.  Unlikely to be mass effect or new tumor . Pt offered head CT which I think would be low yield given reproducibility of muscular pain along posteior lateral neck.  Pt declines, feels comfortable taking NSAIDs, heat, and follow up with PCP.  instructions given to return if new symptoms or new concerns develop.        Gavin Pound. Oletta Lamas, MD 07/01/12 1921

## 2012-06-29 NOTE — ED Notes (Signed)
Pt had CVA in 2008 and was given TPA at [facility].  Pt with very slight weakness to right side.  No other deficits.

## 2012-06-29 NOTE — ED Notes (Signed)
Cerebrovascular disease - unknown origin.

## 2012-06-29 NOTE — ED Notes (Signed)
Lt. Sided h/a rad. Down to neck. Took nothing for the pain.

## 2012-06-29 NOTE — Discharge Instructions (Signed)

## 2013-03-25 ENCOUNTER — Other Ambulatory Visit (HOSPITAL_COMMUNITY): Payer: Self-pay | Admitting: Neurology

## 2013-03-25 ENCOUNTER — Ambulatory Visit (HOSPITAL_COMMUNITY)
Admission: RE | Admit: 2013-03-25 | Discharge: 2013-03-25 | Disposition: A | Discharge status: Home / Self Care | Payer: PRIVATE HEALTH INSURANCE | Source: Ambulatory Visit | Attending: Neurology | Admitting: Neurology

## 2013-03-25 DIAGNOSIS — I6529 Occlusion and stenosis of unspecified carotid artery: Secondary | ICD-10-CM | POA: Insufficient documentation

## 2013-03-25 DIAGNOSIS — Z8673 Personal history of transient ischemic attack (TIA), and cerebral infarction without residual deficits: Secondary | ICD-10-CM

## 2013-03-25 NOTE — Progress Notes (Signed)
*  PRELIMINARY RESULTS* Vascular Ultrasound Carotid Duplex (Doppler) has been completed.  Preliminary findings: Bilateral:  Less than 39% ICA stenosis.  Vertebral artery flow is antegrade.   Tortuous mid to distal ICA bilaterally with increased velocities, but no plaque morphology to suggest stenosis.   Farrel Demark, RDMS, RVT  03/25/2013, 10:02 AM

## 2013-03-26 ENCOUNTER — Other Ambulatory Visit: Payer: Self-pay | Admitting: Orthopedic Surgery

## 2013-03-27 ENCOUNTER — Other Ambulatory Visit: Payer: Self-pay | Admitting: Orthopedic Surgery

## 2013-03-27 DIAGNOSIS — M1710 Unilateral primary osteoarthritis, unspecified knee: Secondary | ICD-10-CM

## 2013-03-31 ENCOUNTER — Encounter (HOSPITAL_COMMUNITY): Payer: Self-pay | Admitting: Pharmacy Technician

## 2013-04-01 ENCOUNTER — Other Ambulatory Visit: Payer: Self-pay | Admitting: Radiology

## 2013-04-01 ENCOUNTER — Telehealth (HOSPITAL_COMMUNITY): Payer: Self-pay | Admitting: Orthopedic Surgery

## 2013-04-01 NOTE — Telephone Encounter (Signed)
Called pt to schedule IVC filter placement. Left VM for her to call me to set up. JMichaux

## 2013-04-02 ENCOUNTER — Encounter (HOSPITAL_COMMUNITY): Payer: Self-pay

## 2013-04-02 ENCOUNTER — Ambulatory Visit (HOSPITAL_COMMUNITY)
Admission: RE | Admit: 2013-04-02 | Discharge: 2013-04-02 | Disposition: A | Discharge status: Home / Self Care | Payer: PRIVATE HEALTH INSURANCE | Source: Ambulatory Visit | Attending: Orthopedic Surgery | Admitting: Orthopedic Surgery

## 2013-04-02 DIAGNOSIS — I10 Essential (primary) hypertension: Secondary | ICD-10-CM | POA: Insufficient documentation

## 2013-04-02 DIAGNOSIS — Z7982 Long term (current) use of aspirin: Secondary | ICD-10-CM | POA: Insufficient documentation

## 2013-04-02 DIAGNOSIS — I809 Phlebitis and thrombophlebitis of unspecified site: Secondary | ICD-10-CM | POA: Insufficient documentation

## 2013-04-02 DIAGNOSIS — E785 Hyperlipidemia, unspecified: Secondary | ICD-10-CM | POA: Insufficient documentation

## 2013-04-02 DIAGNOSIS — I428 Other cardiomyopathies: Secondary | ICD-10-CM | POA: Insufficient documentation

## 2013-04-02 DIAGNOSIS — M159 Polyosteoarthritis, unspecified: Secondary | ICD-10-CM | POA: Insufficient documentation

## 2013-04-02 DIAGNOSIS — Z7902 Long term (current) use of antithrombotics/antiplatelets: Secondary | ICD-10-CM | POA: Insufficient documentation

## 2013-04-02 DIAGNOSIS — M1710 Unilateral primary osteoarthritis, unspecified knee: Secondary | ICD-10-CM

## 2013-04-02 DIAGNOSIS — I059 Rheumatic mitral valve disease, unspecified: Secondary | ICD-10-CM | POA: Insufficient documentation

## 2013-04-02 DIAGNOSIS — D649 Anemia, unspecified: Secondary | ICD-10-CM | POA: Insufficient documentation

## 2013-04-02 DIAGNOSIS — Z8673 Personal history of transient ischemic attack (TIA), and cerebral infarction without residual deficits: Secondary | ICD-10-CM | POA: Insufficient documentation

## 2013-04-02 DIAGNOSIS — Z86718 Personal history of other venous thrombosis and embolism: Secondary | ICD-10-CM | POA: Insufficient documentation

## 2013-04-02 DIAGNOSIS — E119 Type 2 diabetes mellitus without complications: Secondary | ICD-10-CM | POA: Insufficient documentation

## 2013-04-02 DIAGNOSIS — IMO0002 Reserved for concepts with insufficient information to code with codable children: Secondary | ICD-10-CM | POA: Insufficient documentation

## 2013-04-02 LAB — CBC
HCT: 34 % — ABNORMAL LOW (ref 36.0–46.0)
MCV: 75.6 fL — ABNORMAL LOW (ref 78.0–100.0)
RBC: 4.5 MIL/uL (ref 3.87–5.11)
RDW: 16 % — ABNORMAL HIGH (ref 11.5–15.5)
WBC: 8.3 10*3/uL (ref 4.0–10.5)

## 2013-04-02 LAB — BASIC METABOLIC PANEL
CO2: 29 mEq/L (ref 19–32)
Chloride: 102 mEq/L (ref 96–112)
Creatinine, Ser: 0.76 mg/dL (ref 0.50–1.10)

## 2013-04-02 LAB — APTT: aPTT: 35 seconds (ref 24–37)

## 2013-04-02 MED ORDER — MIDAZOLAM HCL 2 MG/2ML IJ SOLN
INTRAMUSCULAR | Status: AC
  Filled 2013-04-02: qty 4

## 2013-04-02 MED ORDER — FENTANYL CITRATE 0.05 MG/ML IJ SOLN
INTRAMUSCULAR | Status: AC
  Filled 2013-04-02: qty 4

## 2013-04-02 MED ORDER — MIDAZOLAM HCL 2 MG/2ML IJ SOLN
INTRAMUSCULAR | Status: AC | PRN
  Administered 2013-04-02 (×2): 1 mg via INTRAVENOUS

## 2013-04-02 MED ORDER — FENTANYL CITRATE 0.05 MG/ML IJ SOLN
INTRAMUSCULAR | Status: AC | PRN
  Administered 2013-04-02: 50 ug via INTRAVENOUS

## 2013-04-02 MED ORDER — SODIUM CHLORIDE 0.9 % IV SOLN: INTRAVENOUS | Status: DC

## 2013-04-02 MED ORDER — IOHEXOL 300 MG/ML  SOLN
150.0000 mL | Freq: Once | INTRAMUSCULAR | Status: AC | PRN
  Administered 2013-04-02: 1 mL via INTRAVENOUS

## 2013-04-02 NOTE — Pre-Procedure Instructions (Signed)
Janet Williamson  04/02/2013   Your procedure is scheduled on:  Tuesday, July 8th  Report to [facility] at 5:30 AM.  Call this number if you have problems the morning of surgery: [phone]   Remember:   Do not eat food or drink liquids after midnight Monday.   Take these medicines the morning of surgery with A SIP OF WATER: Norvasc, Metoprolol   Do not wear jewelry, make-up or nail polish.  Do not wear lotions, powders, or perfumes. You may NOT wear deodorant.  Do not shave underarms & legs 48 hours prior to surgery.    Do not bring valuables to the hospital.  [facility] is not responsible for any belongings or valuables.  Contacts, dentures or bridgework may not be worn into surgery.   Leave suitcase in the car. After surgery it may be brought to your room.  For patients admitted to the hospital, checkout time is 11:00 AM the day of discharge.   Name and phone number of your driver:    Special Instructions: Shower using CHG 2 nights before surgery and the night before surgery.  If you shower the day of surgery use CHG.  Use special wash - you have one bottle of CHG for all showers.  You should use approximately 1/3 of the bottle for each shower.   Please read over the following fact sheets that you were given: Pain Booklet, Coughing and Deep Breathing, Blood Transfusion Information and Surgical Site Infection Prevention

## 2013-04-02 NOTE — H&P (Signed)
Janet Williamson is an 73 y.o. female.   Chief Complaint: pt has remote hx lower extremity DVT 2008 Was treated with ASA daily per her MD Suffered CVA since then and is now on Plavix daily She is scheduled for Rt knee replacement with Dr August Saucer 04/14/2013 Recovery may be extensive; possibly immobile for some time No plans for anticoagulation post surgery per pt Scheduled for retrievable Inferior Vena Cava filter placement HPI: DM; HTN; HLD; cardiomyopathy; Mitral Valve disorder/Murmur; CVA; phlebitis  Past Medical History  Diagnosis Date  . Diabetes mellitus   . Hypertension   . Elevated cholesterol   . Cardiomyopathy in other disease   . Mitral valve disorder   . Heart murmur   . Generalized osteoarthritis   . Anemia   . Phlebitis   . Thrombophlebitis   . Stroke   . Cataracts, bilateral     Past Surgical History  Procedure Laterality Date  . Abdominal hysterectomy    . Eye surgery      cataract removal    No family history on file. Social History:  reports that she has never smoked. She does not have any smokeless tobacco history on file. She reports that she does not drink alcohol or use illicit drugs.  Allergies: No Known Allergies   (Not in a hospital admission)  No results found for this or any previous visit (from the past 48 hour(s)). No results found.  Review of Systems  Constitutional: Negative for fever.  Respiratory: Negative for shortness of breath.   Cardiovascular: Negative for chest pain.  Gastrointestinal: Negative for nausea, vomiting and abdominal pain.  Musculoskeletal: Positive for joint pain.  Neurological: Negative for weakness and headaches.    There were no vitals taken for this visit. Physical Exam  Constitutional: She is oriented to person, place, and time.  Cardiovascular: Normal rate and regular rhythm.   Murmur heard. Respiratory: Effort normal and breath sounds normal. No respiratory distress.  GI: Soft. Bowel sounds are normal.  There is no tenderness.  Musculoskeletal: Normal range of motion.  Neurological: She is alert and oriented to person, place, and time.  Skin: Skin is warm and dry.  Psychiatric: She has a normal mood and affect. Her behavior is normal. Judgment and thought content normal.     Assessment/Plan Remote hx lower extr dvt 2008 ASA daily per MD On Plavix now post CVA- off now for surgery Scheduled for R total knee replacement 04/14/2013- Dr August Saucer Now for retrievable IVC filter Pt and dtr aware of procedure benefits and risks and agreeable to proceed Consent signed and in chart  Raney Koeppen A 04/02/2013, 10:47 AM

## 2013-04-02 NOTE — Procedures (Addendum)
Procedure:  IVC filter placement Access:  Right IJ vein IVC widely patent.  Bard Peerless IVC filter placed in infrarenal IVC.

## 2013-04-02 NOTE — H&P (Signed)
Agree.  OK for outpatient filter.

## 2013-04-03 ENCOUNTER — Ambulatory Visit (HOSPITAL_COMMUNITY)
Admission: RE | Admit: 2013-04-03 | Discharge: 2013-04-03 | Disposition: A | Discharge status: Home / Self Care | Payer: PRIVATE HEALTH INSURANCE | Source: Ambulatory Visit | Attending: Orthopedic Surgery | Admitting: Orthopedic Surgery

## 2013-04-03 ENCOUNTER — Encounter (HOSPITAL_COMMUNITY): Payer: Self-pay

## 2013-04-03 ENCOUNTER — Encounter (HOSPITAL_COMMUNITY)
Admission: RE | Admit: 2013-04-03 | Discharge: 2013-04-03 | Disposition: A | Discharge status: Home / Self Care | Payer: PRIVATE HEALTH INSURANCE | Source: Ambulatory Visit | Attending: Orthopedic Surgery | Admitting: Orthopedic Surgery

## 2013-04-03 DIAGNOSIS — Z01812 Encounter for preprocedural laboratory examination: Secondary | ICD-10-CM | POA: Insufficient documentation

## 2013-04-03 DIAGNOSIS — Z01818 Encounter for other preprocedural examination: Secondary | ICD-10-CM | POA: Insufficient documentation

## 2013-04-03 DIAGNOSIS — I10 Essential (primary) hypertension: Secondary | ICD-10-CM | POA: Insufficient documentation

## 2013-04-03 HISTORY — DX: Gastro-esophageal reflux disease without esophagitis: K21.9

## 2013-04-03 LAB — BASIC METABOLIC PANEL
CO2: 32 mEq/L (ref 19–32)
Chloride: 101 mEq/L (ref 96–112)
Glucose, Bld: 108 mg/dL — ABNORMAL HIGH (ref 70–99)
Potassium: 3.9 mEq/L (ref 3.5–5.1)
Sodium: 140 mEq/L (ref 135–145)

## 2013-04-03 LAB — URINALYSIS, ROUTINE W REFLEX MICROSCOPIC
Ketones, ur: NEGATIVE mg/dL
Nitrite: POSITIVE — AB
Protein, ur: NEGATIVE mg/dL
pH: 6.5 (ref 5.0–8.0)

## 2013-04-03 LAB — CBC
HCT: 36 % (ref 36.0–46.0)
Hemoglobin: 11.4 g/dL — ABNORMAL LOW (ref 12.0–15.0)
MCH: 24.1 pg — ABNORMAL LOW (ref 26.0–34.0)
MCV: 75.9 fL — ABNORMAL LOW (ref 78.0–100.0)
RBC: 4.74 MIL/uL (ref 3.87–5.11)

## 2013-04-03 LAB — URINE MICROSCOPIC-ADD ON

## 2013-04-03 LAB — SURGICAL PCR SCREEN: MRSA, PCR: NEGATIVE

## 2013-04-03 MED ORDER — CHLORHEXIDINE GLUCONATE 4 % EX LIQD: 60.0000 mL | Freq: Once | CUTANEOUS | Status: DC

## 2013-04-03 NOTE — Progress Notes (Signed)
Lab called, patient is positive antibodies , will need type and screen DOS. Stated they would notified MD to see how many units are needed

## 2013-04-05 LAB — URINE CULTURE

## 2013-04-06 ENCOUNTER — Encounter (HOSPITAL_COMMUNITY): Payer: Self-pay

## 2013-04-06 NOTE — Progress Notes (Signed)
Anesthesia chart review:  Patient is a 72 year old female scheduled for right TKA on 04/14/13 by Dr. August Saucer.  History includes obesity, LE DVT '08, CVA, non-smoker, HTN, DM2, hypercholesterolemia, anemia, cataracts, GERD, hysterectomy.  Cardiomyopathy is listed in her history; however more extensive review of her records indicate she had moderate AS by echo in 10/2012.  She also had an evaluation by CT surgeon Dr. Laneta Simmers in 2010 for calcified mass adjacent to the anterior mitral leaflet and mitral annulus. Cardiac MRI and TEE were done, and the etiology was indeterminate.  At that time, he recommended observation/follow-up echocardiograms since she was asymptomatic and finding was felt likely non-malignant and there was otherwise no indication for MVR or AVR.  She continues to have serial echos that are followed by her cardiologist Dr. Donato Schultz.  She had no flow limiting CAD by cath in 2011.  She is s/p a retrievable IVC filter on 04/02/13 in anticipation for surgery.  Per Dr. Anne Fu, "I'm comfortable allowing her to proceed with surgery, right knee replacement, with low to moderate cardiac risk given her prior history of myocardial infarction, stroke, moderate aortic stenosis.  I do not believe that she is high 2 high risk for the surgery.  I would like her to continue her aspirin, however stopping the Plavis 5 days prior to surgery is very reasonable from a cardiovascular perspective."   Carotid duplex on 03/25/13 showed < 39% ICA stenosis bilaterally.   EKG on 04/03/13 showed SB @ 59 bpm.  Cardiac cath on 10/31/09 showed no flow limiting CAD, normal right-sided heart pressures with no evidence of pulmonary HTN, normal cardiac output, calcified AV as well as mildly calcified MV annulus.   Echo on 10/15/2012 showed mild to moderate concentric LVH, EF 55-60%, no regional wall motion abnormalities, moderately thickened and calcified anterior mitral valve leaflet (no significant change from prior study), mild MR,  mild TR, mildly elevated RVSP, moderate calcification and thickening of AV with moderate AS.  Peak velocity 3.1 m/S, mean gradient 24 mmHg, mild AR, Doppler findings suggestive of grade 1a diastolic dysfunction with elevated La pressure.  Compared to prior study in 10/2011, AS has progressed.   CXR on 04/03/13 showed no acute cardiopulmonary abnormality seen.  Preoperative labs from 04/02/13 and 04/03/13 noted.  Urine culture showed > 100,000 E. Coli--result called to Selena Batten at Dr. Diamantina Providence office.  Defer treatment recommendations to Dr. August Saucer.  Patient has been cleared by her cardiologist as described above.  She has known moderate AS and indeterminate calcified mass along the ventricular border of the anterior MV leaflet that is felt stable since 2010.  She is s/p IVC filter last week due to her history of DVT with upcoming surgery.  She will be further evaluated by her assigned anesthesiologist on the day of surgery to discuss the definitive anesthesia plan.  Anesthesiologist Dr. Chaney Malling updated on history.  Velna Ochs [facility]/Anesthesiology Phone [phone] 04/06/2013 4:52 PM

## 2013-04-13 MED ORDER — CEFAZOLIN SODIUM-DEXTROSE 2-3 GM-% IV SOLR
2.0000 g | INTRAVENOUS | Status: AC
  Administered 2013-04-14: 2 g via INTRAVENOUS
  Filled 2013-04-13: qty 50

## 2013-04-13 MED ORDER — CHLORHEXIDINE GLUCONATE 4 % EX LIQD: 60.0000 mL | Freq: Once | CUTANEOUS | Status: DC

## 2013-04-14 ENCOUNTER — Inpatient Hospital Stay (HOSPITAL_COMMUNITY)
Admission: RE | Admit: 2013-04-14 | Discharge: 2013-04-17 | DRG: 470 | Disposition: A | Discharge status: Skilled Nursing Facility | Payer: PRIVATE HEALTH INSURANCE | Source: Ambulatory Visit | Attending: Orthopedic Surgery | Admitting: Orthopedic Surgery

## 2013-04-14 ENCOUNTER — Encounter (HOSPITAL_COMMUNITY)
Admission: RE | Disposition: A | Discharge status: Skilled Nursing Facility | Payer: Self-pay | Source: Ambulatory Visit | Attending: Orthopedic Surgery

## 2013-04-14 ENCOUNTER — Inpatient Hospital Stay (HOSPITAL_COMMUNITY): Payer: PRIVATE HEALTH INSURANCE | Admitting: Certified Registered"

## 2013-04-14 ENCOUNTER — Encounter (HOSPITAL_COMMUNITY): Payer: Self-pay | Admitting: Vascular Surgery

## 2013-04-14 ENCOUNTER — Encounter (HOSPITAL_COMMUNITY): Payer: Self-pay | Admitting: *Deleted

## 2013-04-14 DIAGNOSIS — Z7902 Long term (current) use of antithrombotics/antiplatelets: Secondary | ICD-10-CM

## 2013-04-14 DIAGNOSIS — Z79899 Other long term (current) drug therapy: Secondary | ICD-10-CM

## 2013-04-14 DIAGNOSIS — I428 Other cardiomyopathies: Secondary | ICD-10-CM | POA: Diagnosis present

## 2013-04-14 DIAGNOSIS — M1711 Unilateral primary osteoarthritis, right knee: Secondary | ICD-10-CM

## 2013-04-14 DIAGNOSIS — I10 Essential (primary) hypertension: Secondary | ICD-10-CM | POA: Diagnosis present

## 2013-04-14 DIAGNOSIS — I359 Nonrheumatic aortic valve disorder, unspecified: Secondary | ICD-10-CM | POA: Diagnosis present

## 2013-04-14 DIAGNOSIS — Z7901 Long term (current) use of anticoagulants: Secondary | ICD-10-CM

## 2013-04-14 DIAGNOSIS — M1710 Unilateral primary osteoarthritis, unspecified knee: Principal | ICD-10-CM | POA: Diagnosis present

## 2013-04-14 DIAGNOSIS — Z8673 Personal history of transient ischemic attack (TIA), and cerebral infarction without residual deficits: Secondary | ICD-10-CM

## 2013-04-14 DIAGNOSIS — Z86718 Personal history of other venous thrombosis and embolism: Secondary | ICD-10-CM

## 2013-04-14 DIAGNOSIS — E7800 Pure hypercholesterolemia, unspecified: Secondary | ICD-10-CM | POA: Diagnosis present

## 2013-04-14 DIAGNOSIS — K219 Gastro-esophageal reflux disease without esophagitis: Secondary | ICD-10-CM | POA: Diagnosis present

## 2013-04-14 DIAGNOSIS — E119 Type 2 diabetes mellitus without complications: Secondary | ICD-10-CM | POA: Diagnosis present

## 2013-04-14 HISTORY — PX: TOTAL KNEE ARTHROPLASTY: SHX125

## 2013-04-14 LAB — GLUCOSE, CAPILLARY: Glucose-Capillary: 125 mg/dL — ABNORMAL HIGH (ref 70–99)

## 2013-04-14 LAB — TYPE AND SCREEN: Antibody Screen: POSITIVE

## 2013-04-14 SURGERY — ARTHROPLASTY, KNEE, TOTAL
Anesthesia: General | Site: Knee | Laterality: Right | Wound class: Clean

## 2013-04-14 MED ORDER — ARTIFICIAL TEARS OP OINT
TOPICAL_OINTMENT | OPHTHALMIC | Status: DC | PRN
  Administered 2013-04-14: 1 via OPHTHALMIC

## 2013-04-14 MED ORDER — VITAMIN D3 25 MCG (1000 UNIT) PO TABS
1000.0000 [IU] | ORAL_TABLET | Freq: Every day | ORAL | Status: DC
  Administered 2013-04-14 – 2013-04-17 (×4): 1000 [IU] via ORAL
  Filled 2013-04-14 (×4): qty 1

## 2013-04-14 MED ORDER — SIMVASTATIN 20 MG PO TABS
20.0000 mg | ORAL_TABLET | Freq: Every evening | ORAL | Status: DC
  Administered 2013-04-14 – 2013-04-16 (×3): 20 mg via ORAL
  Filled 2013-04-14 (×4): qty 1

## 2013-04-14 MED ORDER — ONDANSETRON HCL 4 MG/2ML IJ SOLN: 4.0000 mg | Freq: Four times a day (QID) | INTRAMUSCULAR | Status: DC | PRN

## 2013-04-14 MED ORDER — FENTANYL CITRATE 0.05 MG/ML IJ SOLN
INTRAMUSCULAR | Status: DC | PRN
  Administered 2013-04-14: 50 ug via INTRAVENOUS
  Administered 2013-04-14: 100 ug via INTRAVENOUS
  Administered 2013-04-14 (×2): 50 ug via INTRAVENOUS

## 2013-04-14 MED ORDER — NEOSTIGMINE METHYLSULFATE 1 MG/ML IJ SOLN
INTRAMUSCULAR | Status: DC | PRN
  Administered 2013-04-14: 4 mg via INTRAVENOUS

## 2013-04-14 MED ORDER — ONDANSETRON HCL 4 MG PO TABS: 4.0000 mg | ORAL_TABLET | Freq: Four times a day (QID) | ORAL | Status: DC | PRN

## 2013-04-14 MED ORDER — MORPHINE SULFATE 2 MG/ML IJ SOLN
2.0000 mg | INTRAMUSCULAR | Status: DC | PRN
  Administered 2013-04-15: 2 mg via INTRAVENOUS
  Filled 2013-04-14: qty 1

## 2013-04-14 MED ORDER — METOPROLOL SUCCINATE ER 50 MG PO TB24
50.0000 mg | ORAL_TABLET | Freq: Once | ORAL | Status: AC
  Administered 2013-04-14: 50 mg via ORAL
  Filled 2013-04-14: qty 1

## 2013-04-14 MED ORDER — DIPHENHYDRAMINE HCL 12.5 MG/5ML PO ELIX
12.5000 mg | ORAL_SOLUTION | Freq: Four times a day (QID) | ORAL | Status: DC | PRN
  Administered 2013-04-14: 12.5 mg via ORAL
  Filled 2013-04-14: qty 10

## 2013-04-14 MED ORDER — OXYCODONE HCL 5 MG PO TABS
5.0000 mg | ORAL_TABLET | ORAL | Status: DC | PRN
  Administered 2013-04-15 – 2013-04-16 (×3): 10 mg via ORAL
  Administered 2013-04-17 (×2): 5 mg via ORAL
  Filled 2013-04-14: qty 2
  Filled 2013-04-14: qty 1
  Filled 2013-04-14 (×3): qty 2
  Filled 2013-04-14: qty 1

## 2013-04-14 MED ORDER — MENTHOL 3 MG MT LOZG: 1.0000 | LOZENGE | OROMUCOSAL | Status: DC | PRN

## 2013-04-14 MED ORDER — MORPHINE SULFATE (PF) 1 MG/ML IV SOLN
INTRAVENOUS | Status: DC
  Administered 2013-04-14: 4 mg via INTRAVENOUS
  Administered 2013-04-14: 1 mg via INTRAVENOUS
  Administered 2013-04-15: 8 mg via INTRAVENOUS
  Administered 2013-04-15: 9 mg via INTRAVENOUS
  Administered 2013-04-15: 01:00:00 via INTRAVENOUS
  Filled 2013-04-14: qty 25

## 2013-04-14 MED ORDER — WARFARIN - PHARMACIST DOSING INPATIENT: Freq: Every day | Status: DC

## 2013-04-14 MED ORDER — DIPHENHYDRAMINE HCL 50 MG/ML IJ SOLN: 12.5000 mg | Freq: Four times a day (QID) | INTRAMUSCULAR | Status: DC | PRN

## 2013-04-14 MED ORDER — ACETAMINOPHEN 10 MG/ML IV SOLN
INTRAVENOUS | Status: AC
  Filled 2013-04-14: qty 100

## 2013-04-14 MED ORDER — HYDROCHLOROTHIAZIDE 25 MG PO TABS
25.0000 mg | ORAL_TABLET | Freq: Every day | ORAL | Status: DC
  Administered 2013-04-14 – 2013-04-17 (×4): 25 mg via ORAL
  Filled 2013-04-14 (×4): qty 1

## 2013-04-14 MED ORDER — ASPIRIN EC 81 MG PO TBEC
81.0000 mg | DELAYED_RELEASE_TABLET | Freq: Every day | ORAL | Status: DC
  Administered 2013-04-15 – 2013-04-17 (×3): 81 mg via ORAL
  Filled 2013-04-14 (×3): qty 1

## 2013-04-14 MED ORDER — METOCLOPRAMIDE HCL 10 MG PO TABS: 5.0000 mg | ORAL_TABLET | Freq: Three times a day (TID) | ORAL | Status: DC | PRN

## 2013-04-14 MED ORDER — SODIUM CHLORIDE 0.9 % IJ SOLN
INTRAMUSCULAR | Status: DC | PRN
  Administered 2013-04-14: 09:00:00

## 2013-04-14 MED ORDER — PHENOL 1.4 % MT LIQD: 1.0000 | OROMUCOSAL | Status: DC | PRN

## 2013-04-14 MED ORDER — POTASSIUM CHLORIDE IN NACL 20-0.9 MEQ/L-% IV SOLN
INTRAVENOUS | Status: DC
  Administered 2013-04-14 – 2013-04-15 (×2): via INTRAVENOUS
  Filled 2013-04-14 (×2): qty 1000

## 2013-04-14 MED ORDER — METOPROLOL TARTRATE 1 MG/ML IV SOLN
INTRAVENOUS | Status: DC | PRN
  Administered 2013-04-14 (×2): 2.5 mg via INTRAVENOUS

## 2013-04-14 MED ORDER — MORPHINE SULFATE 4 MG/ML IJ SOLN
INTRAMUSCULAR | Status: AC
  Filled 2013-04-14: qty 2

## 2013-04-14 MED ORDER — HYDROMORPHONE HCL PF 1 MG/ML IJ SOLN: 0.2500 mg | INTRAMUSCULAR | Status: DC | PRN

## 2013-04-14 MED ORDER — SODIUM CHLORIDE 0.9 % IJ SOLN: 9.0000 mL | INTRAMUSCULAR | Status: DC | PRN

## 2013-04-14 MED ORDER — METFORMIN HCL ER 500 MG PO TB24
500.0000 mg | ORAL_TABLET | Freq: Every day | ORAL | Status: DC
  Administered 2013-04-15 – 2013-04-17 (×3): 500 mg via ORAL
  Filled 2013-04-14 (×4): qty 1

## 2013-04-14 MED ORDER — WARFARIN SODIUM 5 MG PO TABS
5.0000 mg | ORAL_TABLET | Freq: Once | ORAL | Status: AC
  Administered 2013-04-14: 5 mg via ORAL
  Filled 2013-04-14: qty 1

## 2013-04-14 MED ORDER — CALCIUM CARBONATE 600 MG PO TABS
1800.0000 mg | ORAL_TABLET | Freq: Every day | ORAL | Status: DC
  Filled 2013-04-14: qty 3

## 2013-04-14 MED ORDER — PHENYLEPHRINE HCL 10 MG/ML IJ SOLN
INTRAMUSCULAR | Status: DC | PRN
  Administered 2013-04-14 (×3): 40 ug via INTRAVENOUS

## 2013-04-14 MED ORDER — RAMIPRIL 10 MG PO TABS: 10.0000 mg | ORAL_TABLET | Freq: Every day | ORAL | Status: DC

## 2013-04-14 MED ORDER — PHENYLEPHRINE HCL 10 MG/ML IJ SOLN: INTRAMUSCULAR | Status: DC | PRN

## 2013-04-14 MED ORDER — LIDOCAINE HCL (CARDIAC) 20 MG/ML IV SOLN
INTRAVENOUS | Status: DC | PRN
  Administered 2013-04-14: 60 mg via INTRAVENOUS

## 2013-04-14 MED ORDER — GLYCOPYRROLATE 0.2 MG/ML IJ SOLN
INTRAMUSCULAR | Status: DC | PRN
  Administered 2013-04-14: 0.6 mg via INTRAVENOUS

## 2013-04-14 MED ORDER — ONDANSETRON HCL 4 MG/2ML IJ SOLN: 4.0000 mg | Freq: Once | INTRAMUSCULAR | Status: DC | PRN

## 2013-04-14 MED ORDER — COUMADIN BOOK
Freq: Once | Status: DC
  Filled 2013-04-14: qty 1

## 2013-04-14 MED ORDER — ACETAMINOPHEN 325 MG PO TABS
650.0000 mg | ORAL_TABLET | Freq: Four times a day (QID) | ORAL | Status: DC | PRN
  Administered 2013-04-15 – 2013-04-17 (×3): 650 mg via ORAL
  Filled 2013-04-14 (×3): qty 2

## 2013-04-14 MED ORDER — BUPIVACAINE HCL (PF) 0.25 % IJ SOLN
INTRAMUSCULAR | Status: AC
  Filled 2013-04-14: qty 30

## 2013-04-14 MED ORDER — CALCIUM CARBONATE 1250 (500 CA) MG PO TABS
3.0000 | ORAL_TABLET | Freq: Every day | ORAL | Status: DC
  Administered 2013-04-15 – 2013-04-17 (×3): 1500 mg via ORAL
  Filled 2013-04-14 (×4): qty 3

## 2013-04-14 MED ORDER — METHOCARBAMOL 500 MG PO TABS
500.0000 mg | ORAL_TABLET | Freq: Four times a day (QID) | ORAL | Status: DC | PRN
  Administered 2013-04-16 – 2013-04-17 (×2): 500 mg via ORAL
  Filled 2013-04-14 (×2): qty 1

## 2013-04-14 MED ORDER — METOPROLOL SUCCINATE ER 50 MG PO TB24
50.0000 mg | ORAL_TABLET | Freq: Every day | ORAL | Status: DC
  Administered 2013-04-14 – 2013-04-17 (×4): 50 mg via ORAL
  Filled 2013-04-14 (×4): qty 1

## 2013-04-14 MED ORDER — METHOCARBAMOL 100 MG/ML IJ SOLN
500.0000 mg | Freq: Four times a day (QID) | INTRAVENOUS | Status: DC | PRN
  Filled 2013-04-14: qty 5

## 2013-04-14 MED ORDER — DOCUSATE SODIUM 100 MG PO CAPS
100.0000 mg | ORAL_CAPSULE | Freq: Two times a day (BID) | ORAL | Status: DC
  Administered 2013-04-14 – 2013-04-17 (×7): 100 mg via ORAL
  Filled 2013-04-14 (×7): qty 1

## 2013-04-14 MED ORDER — CLOPIDOGREL BISULFATE 75 MG PO TABS
75.0000 mg | ORAL_TABLET | Freq: Every day | ORAL | Status: DC
  Administered 2013-04-15 – 2013-04-17 (×3): 75 mg via ORAL
  Filled 2013-04-14 (×3): qty 1

## 2013-04-14 MED ORDER — ONDANSETRON HCL 4 MG/2ML IJ SOLN
INTRAMUSCULAR | Status: DC | PRN
  Administered 2013-04-14: 4 mg via INTRAVENOUS

## 2013-04-14 MED ORDER — PROPOFOL 10 MG/ML IV BOLUS
INTRAVENOUS | Status: DC | PRN
  Administered 2013-04-14: 10 mg via INTRAVENOUS
  Administered 2013-04-14: 120 mg via INTRAVENOUS

## 2013-04-14 MED ORDER — CLOPIDOGREL BISULFATE 75 MG PO TABS
75.0000 mg | ORAL_TABLET | Freq: Every day | ORAL | Status: DC
  Filled 2013-04-14: qty 1

## 2013-04-14 MED ORDER — ACETAMINOPHEN 10 MG/ML IV SOLN
1000.0000 mg | Freq: Once | INTRAVENOUS | Status: AC | PRN
  Administered 2013-04-14: 1000 mg via INTRAVENOUS

## 2013-04-14 MED ORDER — MORPHINE SULFATE (PF) 1 MG/ML IV SOLN
INTRAVENOUS | Status: AC
  Filled 2013-04-14: qty 25

## 2013-04-14 MED ORDER — WARFARIN VIDEO: Freq: Once | Status: DC

## 2013-04-14 MED ORDER — ACETAMINOPHEN 650 MG RE SUPP: 650.0000 mg | Freq: Four times a day (QID) | RECTAL | Status: DC | PRN

## 2013-04-14 MED ORDER — METOCLOPRAMIDE HCL 5 MG/ML IJ SOLN: 5.0000 mg | Freq: Three times a day (TID) | INTRAMUSCULAR | Status: DC | PRN

## 2013-04-14 MED ORDER — NALOXONE HCL 0.4 MG/ML IJ SOLN: 0.4000 mg | INTRAMUSCULAR | Status: DC | PRN

## 2013-04-14 MED ORDER — LACTATED RINGERS IV SOLN
INTRAVENOUS | Status: DC | PRN
  Administered 2013-04-14 (×2): via INTRAVENOUS

## 2013-04-14 MED ORDER — RAMIPRIL 10 MG PO CAPS
10.0000 mg | ORAL_CAPSULE | Freq: Every day | ORAL | Status: DC
  Administered 2013-04-14 – 2013-04-17 (×4): 10 mg via ORAL
  Filled 2013-04-14 (×4): qty 1

## 2013-04-14 MED ORDER — 0.9 % SODIUM CHLORIDE (POUR BTL) OPTIME
TOPICAL | Status: DC | PRN
  Administered 2013-04-14: 2000 mL
  Administered 2013-04-14: 3000 mL
  Administered 2013-04-14: 1000 mL

## 2013-04-14 MED ORDER — MIDAZOLAM HCL 5 MG/5ML IJ SOLN
INTRAMUSCULAR | Status: DC | PRN
  Administered 2013-04-14 (×2): 1 mg via INTRAVENOUS

## 2013-04-14 MED ORDER — BUPIVACAINE LIPOSOME 1.3 % IJ SUSP
20.0000 mL | INTRAMUSCULAR | Status: DC
  Filled 2013-04-14: qty 20

## 2013-04-14 MED ORDER — INSULIN ASPART 100 UNIT/ML ~~LOC~~ SOLN
0.0000 [IU] | Freq: Three times a day (TID) | SUBCUTANEOUS | Status: DC
  Administered 2013-04-14 – 2013-04-17 (×7): 2 [IU] via SUBCUTANEOUS

## 2013-04-14 MED ORDER — AMLODIPINE BESYLATE 5 MG PO TABS
5.0000 mg | ORAL_TABLET | Freq: Every day | ORAL | Status: DC
  Administered 2013-04-15 – 2013-04-17 (×3): 5 mg via ORAL
  Filled 2013-04-14 (×3): qty 1

## 2013-04-14 MED ORDER — BUPIVACAINE-EPINEPHRINE (PF) 0.5% -1:200000 IJ SOLN
INTRAMUSCULAR | Status: AC
  Filled 2013-04-14: qty 10

## 2013-04-14 MED ORDER — CEFAZOLIN SODIUM-DEXTROSE 2-3 GM-% IV SOLR
2.0000 g | Freq: Four times a day (QID) | INTRAVENOUS | Status: AC
  Administered 2013-04-14 (×2): 2 g via INTRAVENOUS
  Filled 2013-04-14 (×2): qty 50

## 2013-04-14 MED ORDER — ROCURONIUM BROMIDE 100 MG/10ML IV SOLN
INTRAVENOUS | Status: DC | PRN
  Administered 2013-04-14: 10 mg via INTRAVENOUS
  Administered 2013-04-14: 40 mg via INTRAVENOUS

## 2013-04-14 SURGICAL SUPPLY — 79 items
BANDAGE ELASTIC 4 VELCRO ST LF (GAUZE/BANDAGES/DRESSINGS) ×2 IMPLANT
BANDAGE ELASTIC 6 VELCRO ST LF (GAUZE/BANDAGES/DRESSINGS) ×2 IMPLANT
BANDAGE ESMARK 6X9 LF (GAUZE/BANDAGES/DRESSINGS) ×1 IMPLANT
BLADE SAG 18X100X1.27 (BLADE) ×1 IMPLANT
BLADE SAW SGTL 13.0X1.19X90.0M (BLADE) ×2 IMPLANT
BNDG CMPR 9X6 STRL LF SNTH (GAUZE/BANDAGES/DRESSINGS) ×1
BNDG CMPR MED 10X6 ELC LF (GAUZE/BANDAGES/DRESSINGS) ×3
BNDG COHESIVE 6X5 TAN STRL LF (GAUZE/BANDAGES/DRESSINGS) ×2 IMPLANT
BNDG ELASTIC 6X10 VLCR STRL LF (GAUZE/BANDAGES/DRESSINGS) ×6 IMPLANT
BNDG ESMARK 6X9 LF (GAUZE/BANDAGES/DRESSINGS) ×2
BOWL SMART MIX CTS (DISPOSABLE) ×2 IMPLANT
CEMENT BONE SIMPLEX SPEEDSET (Cement) ×2 IMPLANT
CLOTH BEACON ORANGE TIMEOUT ST (SAFETY) ×2 IMPLANT
COVER SURGICAL LIGHT HANDLE (MISCELLANEOUS) ×2 IMPLANT
CUFF TOURNIQUET SINGLE 34IN LL (TOURNIQUET CUFF) ×1 IMPLANT
CUFF TOURNIQUET SINGLE 44IN (TOURNIQUET CUFF) IMPLANT
DRAPE INCISE IOBAN 66X45 STRL (DRAPES) ×1 IMPLANT
DRAPE ORTHO SPLIT 77X108 STRL (DRAPES) ×6
DRAPE SURG ORHT 6 SPLT 77X108 (DRAPES) ×3 IMPLANT
DRAPE U-SHAPE 47X51 STRL (DRAPES) ×2 IMPLANT
DRAPE X-RAY CASS 24X20 (DRAPES) IMPLANT
DRSG PAD ABDOMINAL 8X10 ST (GAUZE/BANDAGES/DRESSINGS) ×4 IMPLANT
DURAPREP 26ML APPLICATOR (WOUND CARE) ×2 IMPLANT
ELECT REM PT RETURN 9FT ADLT (ELECTROSURGICAL) ×2
ELECTRODE REM PT RTRN 9FT ADLT (ELECTROSURGICAL) ×1 IMPLANT
EVACUATOR 1/8 PVC DRAIN (DRAIN) ×2 IMPLANT
FACESHIELD LNG OPTICON STERILE (SAFETY) ×2 IMPLANT
GAUZE XEROFORM 5X9 LF (GAUZE/BANDAGES/DRESSINGS) ×2 IMPLANT
GLOVE BIOGEL PI IND STRL 7.5 (GLOVE) ×1 IMPLANT
GLOVE BIOGEL PI IND STRL 8 (GLOVE) ×1 IMPLANT
GLOVE BIOGEL PI INDICATOR 7.5 (GLOVE) ×1
GLOVE BIOGEL PI INDICATOR 8 (GLOVE) ×1
GLOVE ECLIPSE 7.0 STRL STRAW (GLOVE) ×4 IMPLANT
GLOVE SURG ORTHO 8.0 STRL STRW (GLOVE) ×2 IMPLANT
GOWN PREVENTION PLUS LG XLONG (DISPOSABLE) ×2 IMPLANT
GOWN PREVENTION PLUS XLARGE (GOWN DISPOSABLE) ×2 IMPLANT
GOWN STRL NON-REIN LRG LVL3 (GOWN DISPOSABLE) ×4 IMPLANT
HANDPIECE INTERPULSE COAX TIP (DISPOSABLE) ×2
HOOD PEEL AWAY FACE SHEILD DIS (HOOD) ×6 IMPLANT
IMMOBILIZER KNEE 20 (SOFTGOODS)
IMMOBILIZER KNEE 20 THIGH 36 (SOFTGOODS) IMPLANT
IMMOBILIZER KNEE 22 UNIV (SOFTGOODS) IMPLANT
IMMOBILIZER KNEE 24 THIGH 36 (MISCELLANEOUS) IMPLANT
IMMOBILIZER KNEE 24 UNIV (MISCELLANEOUS)
KIT BASIN OR (CUSTOM PROCEDURE TRAY) ×2 IMPLANT
KIT ROOM TURNOVER OR (KITS) ×2 IMPLANT
KNEE LEVEL 1 ×1 IMPLANT
MANIFOLD NEPTUNE II (INSTRUMENTS) ×2 IMPLANT
MARKER SPHERE PSV REFLC THRD 5 (MARKER) IMPLANT
NDL 18GX1X1/2 (RX/OR ONLY) (NEEDLE) ×1 IMPLANT
NDL SPNL 18GX3.5 QUINCKE PK (NEEDLE) ×1 IMPLANT
NEEDLE 18GX1X1/2 (RX/OR ONLY) (NEEDLE) ×2 IMPLANT
NEEDLE SPNL 18GX3.5 QUINCKE PK (NEEDLE) ×2 IMPLANT
NS IRRIG 1000ML POUR BTL (IV SOLUTION) ×2 IMPLANT
PACK TOTAL JOINT (CUSTOM PROCEDURE TRAY) ×2 IMPLANT
PAD ARMBOARD 7.5X6 YLW CONV (MISCELLANEOUS) ×4 IMPLANT
PAD CAST 4YDX4 CTTN HI CHSV (CAST SUPPLIES) ×1 IMPLANT
PADDING CAST COTTON 4X4 STRL (CAST SUPPLIES) ×2
PADDING CAST COTTON 6X4 STRL (CAST SUPPLIES) ×4 IMPLANT
PIN SCHANZ 4MM 130MM (PIN) IMPLANT
RUBBERBAND STERILE (MISCELLANEOUS) ×2 IMPLANT
SET HNDPC FAN SPRY TIP SCT (DISPOSABLE) ×1 IMPLANT
SPONGE GAUZE 4X4 12PLY (GAUZE/BANDAGES/DRESSINGS) ×2 IMPLANT
SPONGE LAP 18X18 X RAY DECT (DISPOSABLE) IMPLANT
STAPLER VISISTAT 35W (STAPLE) ×2 IMPLANT
SUCTION FRAZIER TIP 10 FR DISP (SUCTIONS) ×2 IMPLANT
SUT ETHILON 3 0 PS 1 (SUTURE) ×4 IMPLANT
SUT VIC AB 0 CTB1 27 (SUTURE) ×6 IMPLANT
SUT VIC AB 1 CT1 27 (SUTURE) ×10
SUT VIC AB 1 CT1 27XBRD ANBCTR (SUTURE) ×5 IMPLANT
SUT VIC AB 2-0 CT1 27 (SUTURE) ×4
SUT VIC AB 2-0 CT1 TAPERPNT 27 (SUTURE) ×2 IMPLANT
SYR 30ML SLIP (SYRINGE) ×2 IMPLANT
SYR 50ML LL SCALE MARK (SYRINGE) ×1 IMPLANT
SYR TB 1ML LUER SLIP (SYRINGE) ×2 IMPLANT
TOWEL OR 17X24 6PK STRL BLUE (TOWEL DISPOSABLE) ×2 IMPLANT
TOWEL OR 17X26 10 PK STRL BLUE (TOWEL DISPOSABLE) ×4 IMPLANT
TRAY FOLEY CATH 14FR (SET/KITS/TRAYS/PACK) ×2 IMPLANT
WATER STERILE IRR 1000ML POUR (IV SOLUTION) ×3 IMPLANT

## 2013-04-14 NOTE — Progress Notes (Signed)
Orthopedic Tech Progress Note Patient Details:  Janet Williamson 17-Mar-1941 [id number] Applied CPM to RLE.  Applied OHF with trapeze to bed. CPM Right Knee Right Knee Flexion (Degrees): 40 Right Knee Extension (Degrees): 0   [name] 04/14/2013, 11:41 AM

## 2013-04-14 NOTE — Preoperative (Signed)
Beta Blockers   Reason not to administer Beta Blockers:Not Applicable 

## 2013-04-14 NOTE — Anesthesia Postprocedure Evaluation (Signed)
  Anesthesia Post-op Note  Patient: Janet Williamson  Procedure(s) Performed: Procedure(s): TOTAL KNEE ARTHROPLASTY (Right)  Patient Location: PACU  Anesthesia Type:General  Level of Consciousness: awake, alert  and oriented  Airway and Oxygen Therapy: Patient Spontanous Breathing and Patient connected to nasal cannula oxygen  Post-op Pain: mild  Post-op Assessment: Post-op Vital signs reviewed, Patient's Cardiovascular Status Stable, Patent Airway and Pain level controlled  Post-op Vital Signs: stable  Complications: No apparent anesthesia complications

## 2013-04-14 NOTE — Anesthesia Procedure Notes (Signed)
Procedure Name: Intubation Date/Time: 04/14/2013 7:42 AM Performed by: Ellin Goodie Pre-anesthesia Checklist: Patient identified, Emergency Drugs available, Suction available, Patient being monitored and Timeout performed Patient Re-evaluated:Patient Re-evaluated prior to inductionOxygen Delivery Method: Circle system utilized Preoxygenation: Pre-oxygenation with 100% oxygen Intubation Type: IV induction Ventilation: Mask ventilation without difficulty and Oral airway inserted - appropriate to patient size Laryngoscope Size: Mac and 4 Grade View: Grade I Tube type: Oral Tube size: 7.5 mm Number of attempts: 1 Airway Equipment and Method: Stylet and Oral airway Placement Confirmation: ETT inserted through vocal cords under direct vision,  positive ETCO2 and breath sounds checked- equal and bilateral Secured at: 23 cm Tube secured with: Tape Dental Injury: Teeth and Oropharynx as per pre-operative assessment  Comments: Easy atraumatic induction and intubation.  Dr. Noreene Larsson verified placement of ETT.  Carlynn Herald, CRNA

## 2013-04-14 NOTE — Brief Op Note (Signed)
04/14/2013  10:27 AM  PATIENT:  Iran Ouch  72 y.o. female  PRE-OPERATIVE DIAGNOSIS:  Right Knee Osteoarthritis  POST-OPERATIVE DIAGNOSIS:  Right Knee Osteoarthritis  PROCEDURE:  Procedure(s): TOTAL KNEE ARTHROPLASTY  SURGEON:  Surgeon(s): Cammy Copa, MD  ASSISTANT: April Hall rnfa  ANESTHESIA:   general  EBL: 100 ml    Total I/O In: 1300 [I.V.:1300] Out: 500 [Urine:500]  BLOOD ADMINISTERED: none  DRAINS: none   LOCAL MEDICATIONS USED:  none  SPECIMEN:  No Specimen  COUNTS:  YES  TOURNIQUET:   Total Tourniquet Time Documented: Thigh (Right) - 96 minutes Total: Thigh (Right) - 96 minutes   DICTATION: .Other Dictation: Dictation Number [id number]  PLAN OF CARE: Admit to inpatient   PATIENT DISPOSITION:  PACU - hemodynamically stable

## 2013-04-14 NOTE — Anesthesia Preprocedure Evaluation (Addendum)
Anesthesia Evaluation  Patient identified by MRN, date of birth, ID band Patient awake    Reviewed: Allergy & Precautions, H&P , NPO status , Patient's Chart, lab work & pertinent test results, reviewed documented beta blocker date and time   Airway Mallampati: I TM Distance: >3 FB     Dental  (+) Edentulous Upper, Edentulous Lower and Dental Advisory Given   Pulmonary  breath sounds clear to auscultation        Cardiovascular hypertension, Pt. on medications and Pt. on home beta blockers + Valvular Problems/Murmurs AS and MR Rhythm:Regular Rate:Normal     Neuro/Psych CVA, No Residual Symptoms    GI/Hepatic GERD-  Medicated and Controlled,  Endo/Other  diabetes, Well Controlled, Oral Hypoglycemic Agents  Renal/GU      Musculoskeletal   Abdominal (+)  Abdomen: soft. Bowel sounds: normal.  Peds  Hematology   Anesthesia Other Findings   Reproductive/Obstetrics                        Anesthesia Physical Anesthesia Plan  ASA: III  Anesthesia Plan: General   Post-op Pain Management:    Induction: Intravenous  Airway Management Planned: Oral ETT  Additional Equipment:   Intra-op Plan:   Post-operative Plan: Extubation in OR  Informed Consent:   Dental advisory given  Plan Discussed with: CRNA and Anesthesiologist  Anesthesia Plan Comments: (Htn Type 2 DM glucose 118 DJD R. Knee Obesity)       Anesthesia Quick Evaluation

## 2013-04-14 NOTE — H&P (Signed)
TOTAL KNEE ADMISSION H&P  Patient is being admitted for right total knee arthroplasty.  Subjective:  Chief Complaint:right knee pain.  HPI: Janet Williamson, 72 y.o. female, has a history of pain and functional disability in the right knee due to arthritis and has failed non-surgical conservative treatments for greater than 12 weeks to includeNSAID's and/or analgesics, corticosteriod injections, flexibility and strengthening excercises, use of assistive devices and activity modification.  Onset of symptoms was gradual, starting >10 years ago with gradually worsening course since that time. The patient noted no past surgery on the right knee(s).  Patient currently rates pain in the right knee(s) at 9 out of 10 with activity. Patient has night pain, worsening of pain with activity and weight bearing, pain that interferes with activities of daily living, pain with passive range of motion, crepitus and joint swelling.  Patient has evidence of subchondral cysts, subchondral sclerosis, periarticular osteophytes and joint space narrowing by imaging studies. This patient has had significant history of worsening pain which is interfering with quality of life to the point she is willing to undergo the significant risks of surgery in her case for the potential of relief. There is no active infection.  There are no active problems to display for this patient.  Past Medical History  Diagnosis Date  . Diabetes mellitus   . Hypertension   . Elevated cholesterol   . Cardiomyopathy in other disease   . Mitral valve disorder   . Heart murmur   . Generalized osteoarthritis   . Anemia   . Phlebitis   . Thrombophlebitis   . Stroke   . Cataracts, bilateral   . GERD (gastroesophageal reflux disease)   . Aortic stenosis     moderate AS 10/2012 echo (Dr. Anne Fu)    Past Surgical History  Procedure Laterality Date  . Abdominal hysterectomy    . Eye surgery      cataract removal    Prescriptions prior to  admission  Medication Sig Dispense Refill  . amLODipine (NORVASC) 5 MG tablet Take 5 mg by mouth daily.      Marland Kitchen aspirin EC 81 MG tablet Take 81 mg by mouth daily.      . Calcium Carbonate (CALCIUM 600 PO) Take 1 tablet by mouth daily.      . cholecalciferol (VITAMIN D) 1000 UNITS tablet Take 1,000 Units by mouth daily.      . clopidogrel (PLAVIX) 75 MG tablet Take 75 mg by mouth daily.      . hydrochlorothiazide (HYDRODIURIL) 25 MG tablet Take 25 mg by mouth daily.      . metFORMIN (GLUCOPHAGE-XR) 500 MG 24 hr tablet Take 500 mg by mouth daily with breakfast.      . metoprolol succinate (TOPROL-XL) 50 MG 24 hr tablet Take 50 mg by mouth daily. Take with or immediately following a meal.      . ramipril (ALTACE) 10 MG tablet Take 10 mg by mouth daily.      . simvastatin (ZOCOR) 20 MG tablet Take 20 mg by mouth every evening.       No Known Allergies  History  Substance Use Topics  . Smoking status: Never Smoker   . Smokeless tobacco: Never Used  . Alcohol Use: No    History reviewed. No pertinent family history.   Review of Systems  Constitutional: Negative.   HENT: Negative.   Eyes: Negative.   Respiratory: Negative.   Cardiovascular: Negative.   Gastrointestinal: Negative.   Genitourinary: Negative.   Musculoskeletal:  Positive for joint pain.  Skin: Negative.   Neurological: Negative.   Endo/Heme/Allergies: Negative.   Psychiatric/Behavioral: Negative.     Objective:  Physical Exam  Constitutional: She appears well-developed.  HENT:  Head: Normocephalic.  Eyes: Pupils are equal, round, and reactive to light.  Neck: Normal range of motion.  Cardiovascular: Normal rate.   Respiratory: Effort normal.  GI: Soft.  Neurological: She is alert.  Skin: Skin is warm.  Psychiatric: She has a normal mood and affect.  right knee varus alignment - dp 1/4 - rom 5 - 100 - stable collateral and cruciate ligaments  Vital signs in last 24 hours: Temp:  [97.6 F (36.4 C)] 97.6 F  (36.4 C) (07/08 0554) Pulse Rate:  [64] 64 (07/08 0554) Resp:  [18] 18 (07/08 0554) BP: (142)/(76) 142/76 mmHg (07/08 0554) SpO2:  [98 %] 98 % (07/08 0554)  Labs:   There is no weight on file to calculate BMI.   Imaging Review Plain radiographs demonstrate severe degenerative joint disease of the right knee(s). The overall alignment ismild varus. The bone quality appears to be good for age and reported activity level.  Assessment/Plan:  End stage arthritis, right knee   The patient history, physical examination, clinical judgment of the provider and imaging studies are consistent with end stage degenerative joint disease of the right knee(s) and total knee arthroplasty is deemed medically necessary. The treatment options including medical management, injection therapy arthroscopy and arthroplasty were discussed at length. The risks and benefits of total knee arthroplasty were presented and reviewed. The risks due to aseptic loosening, infection, stiffness, patella tracking problems, thromboembolic complications and other imponderables were discussed. The patient acknowledged the explanation, agreed to proceed with the plan and consent was signed. Patient is being admitted for inpatient treatment for surgery, pain control, PT, OT, prophylactic antibiotics, VTE prophylaxis, progressive ambulation and ADL's and discharge planning. The patient is planning to be discharged to skilled nursing facility Filter placed for history of dvt in the right leg

## 2013-04-14 NOTE — Plan of Care (Signed)
Problem: Consults Goal: Diagnosis- Total Joint Replacement Primary Total Knee Right     

## 2013-04-14 NOTE — Op Note (Signed)
[name], SCINTO NO.:  [id number]  MEDICAL RECORD NO.:  [id number]  LOCATION:  5N08C                        FACILITY:  [facility]  PHYSICIAN:  Burnard Bunting, M.D.    DATE OF BIRTH:  1940/11/13  DATE OF PROCEDURE:  04/14/2013 DATE OF DISCHARGE:                              OPERATIVE REPORT   PREOPERATIVE DIAGNOSIS:  Right knee arthritis.  POSTOPERATIVE DIAGNOSIS:  Right knee arthritis.  PROCEDURE:  Right total knee replacement.  SURGEON:  Burnard Bunting, M.D.  ASSISTANT:  Vickii Penna, RNFA.  INDICATIONS:  Janet Williamson is a patient with right knee arthritis refractory to nonoperative management, presents for total knee arthroplasty after explanation of risks and benefits.  She is at moderate-to-high risk because of her other underlying medical problems.  PROCEDURE IN DETAIL:  The patient was brought to the operating room where general endotracheal anesthesia was induced.  Preoperative antibiotics administered.  Time-out was called.  Right leg was prescrubbed with alcohol and Betadine, which was allowed to air dry. Prepped with DuraPrep solution including foot and draped in a sterile manner.  Collier Flowers was used cover the operative field.  Leg was elevated, exsanguinated.  Esmarch tourniquet inflated to 300 mmHg at 96 minutes. Anterior approach to knee was made.  Skin and subcutaneous tissues were sharply divided.  Median parapatellar approach was made, marked with #1 Vicryl suture.  Soft tissue removed anterior distal femur.  Large loose body removed from the anterior compartment.  ACL and PCL released. Subperiosteal dissection performed on the medial aspect of the tibia in this varus knee.  Fat pad partially excised.  Lateral patellofemoral ligament released.  At this time, intramedullary alignment was then used on the tibia and cut perpendicular mechanical axis was made taking 9 off the tibia.  In a similar fashion, 10 mm cut was made in 5 degrees of valgus  off the distal femur.  Sizing guide was placed.  Chamfer and box cuts were made.  Stryker scorpion size 2 femur was used along with 3 tibial base plate, 11 mm polyethylene insert with 29 mm offset patella. Posterior cruciate stabilizing components were utilized.  Tibia was then keel punched.  Patella was prepared freehand from 24 to 14 and with 9 button replacing.  Trial components in position.  The patient had excellent stability, full extension, good stability.  Varus-valgus stress at 0, 30, and 90 degrees.  Trial components were removed.  3 L of irrigating solution utilized.  True components cemented into position with full extension.  Excellent flexion, achieved with excellent patellar tracking using no thumbs technique.  Cement was allowed to harden.  Excess cement removed.  Tourniquet was released.  Bleeding points were controlled with electrocautery.  Incision was then closed using interrupted inverted #1 Vicryl suture, 2-0 Vicryl suture, and skin staples.  The patient tolerated the procedure well without immediate complications.  It should be noted that Exparel was utilized after the cuts were made on the femur in terms of pain relief. Bulky dressing and knee immobilizer placed.     Burnard Bunting, M.D.     GSD/MEDQ  D:  04/14/2013  T:  04/14/2013  Job:  [id number]

## 2013-04-14 NOTE — Evaluation (Signed)
Physical Therapy Evaluation Patient Details Name: Janet Williamson MRN: [id number] DOB: 1940/12/08 Today's Date: 04/14/2013 Time: 4098-1191 PT Time Calculation (min): 18 min  PT Assessment / Plan / Recommendation History of Present Illness     Clinical Impression  Patient is s/p R TKR surgery resulting in functional limitations due to the deficits listed below (see PT Problem List).  Patient will benefit from skilled PT to increase their independence and safety with mobility to allow discharge to the venue listed below.  Pt declined standing and ambulation today due to fatigue and requested to start tomorrow.     PT Assessment  Patient needs continued PT services    Follow Up Recommendations  SNF    Does the patient have the potential to tolerate intense rehabilitation      Barriers to Discharge        Equipment Recommendations  Rolling walker with 5" wheels    Recommendations for Other Services     Frequency 7X/week    Precautions / Restrictions Precautions Precautions: Knee Required Braces or Orthoses: Knee Immobilizer - Right Restrictions Weight Bearing Restrictions: Yes Other Position/Activity Restrictions: R LE WBAT   Pertinent Vitals/Pain Minimal R knee pain at rest which increased with mobility, repositioned, PCA encouraged      Mobility  Bed Mobility Bed Mobility: Supine to Sit;Sit to Supine;Scooting to HOB Supine to Sit: 4: Min assist;With rails Sit to Supine: 4: Min assist;With rail Scooting to HOB: 3: Mod assist;With trapeze Details for Bed Mobility Assistance: verbal cues for technique, assist for R LE Transfers Transfers: Not assessed (pt declined today)    Exercises     PT Diagnosis: Acute pain;Difficulty walking  PT Problem List: Decreased strength;Decreased range of motion;Decreased mobility;Decreased knowledge of precautions;Decreased knowledge of use of DME;Pain PT Treatment Interventions: Functional mobility training;Gait training;DME  instruction;Patient/family education;Therapeutic exercise;Therapeutic activities     PT Goals(Current goals can be found in the care plan section) Acute Rehab PT Goals PT Goal Formulation: With patient Time For Goal Achievement: 04/21/13 Potential to Achieve Goals: Good  Visit Information  Last PT Received On: 04/14/13 Assistance Needed: +2       Prior Functioning  Home Living Family/patient expects to be discharged to:: Skilled nursing facility Prior Function Level of Independence: Independent Communication Communication: No difficulties    Cognition  Cognition Arousal/Alertness: Awake/alert Behavior During Therapy: WFL for tasks assessed/performed Overall Cognitive Status: Within Functional Limits for tasks assessed    Extremity/Trunk Assessment Lower Extremity Assessment Lower Extremity Assessment: RLE deficits/detail RLE Deficits / Details: good bilateral ankle pumps, poor quad contraction, assist required for bed mobility, maintained KI   Balance Balance Balance Assessed: Yes Static Sitting Balance Static Sitting - Balance Support: Bilateral upper extremity supported;Feet supported Static Sitting - Level of Assistance: 5: Stand by assistance Static Sitting - Comment/# of Minutes: 5 min  End of Session PT - End of Session Equipment Utilized During Treatment: Right knee immobilizer Activity Tolerance: Patient limited by pain;Patient limited by fatigue Patient left: in bed;with call bell/phone within reach;with family/visitor present CPM Right Knee CPM Right Knee: Off (removed by PT at 1540) Right Knee Flexion (Degrees): 40 Right Knee Extension (Degrees): 0  GP     Jaxxen Voong,KATHrine E 04/14/2013, 4:14 PM Zenovia Jarred, PT, DPT 04/14/2013 Pager: [phone]

## 2013-04-14 NOTE — Progress Notes (Signed)
UR COMPLETED  

## 2013-04-14 NOTE — Progress Notes (Signed)
ANTICOAGULATION CONSULT NOTE - Initial Consult  Pharmacy Consult for coumadin Indication: VTE prophylaxis  No Known Allergies  Patient Measurements:  Ht: 64" Wt: 88.1 kg  Vital Signs: Temp: 97.8 F (36.6 C) (07/08 1240) Temp src: Oral (07/08 0554) BP: 143/65 mmHg (07/08 1240) Pulse Rate: 50 (07/08 1240)  Labs: No results found for this basename: HGB, HCT, PLT, APTT, LABPROT, INR, HEPARINUNFRC, CREATININE, CKTOTAL, CKMB, TROPONINI,  in the last 72 hours  CrCl is unknown because there is no height on file for the current visit.   Medical History: Past Medical History  Diagnosis Date  . Diabetes mellitus   . Hypertension   . Elevated cholesterol   . Cardiomyopathy in other disease   . Mitral valve disorder   . Heart murmur   . Generalized osteoarthritis   . Anemia   . Phlebitis   . Thrombophlebitis   . Stroke   . Cataracts, bilateral   . GERD (gastroesophageal reflux disease)   . Aortic stenosis     moderate AS 10/2012 echo (Dr. Anne Fu)    Medications:  Prescriptions prior to admission  Medication Sig Dispense Refill  . amLODipine (NORVASC) 5 MG tablet Take 5 mg by mouth daily.      Marland Kitchen aspirin EC 81 MG tablet Take 81 mg by mouth daily.      . Calcium Carbonate (CALCIUM 600 PO) Take 1 tablet by mouth daily.      . cholecalciferol (VITAMIN D) 1000 UNITS tablet Take 1,000 Units by mouth daily.      . clopidogrel (PLAVIX) 75 MG tablet Take 75 mg by mouth daily.      . hydrochlorothiazide (HYDRODIURIL) 25 MG tablet Take 25 mg by mouth daily.      . metFORMIN (GLUCOPHAGE-XR) 500 MG 24 hr tablet Take 500 mg by mouth daily with breakfast.      . metoprolol succinate (TOPROL-XL) 50 MG 24 hr tablet Take 50 mg by mouth daily. Take with or immediately following a meal.      . ramipril (ALTACE) 10 MG tablet Take 10 mg by mouth daily.      . simvastatin (ZOCOR) 20 MG tablet Take 20 mg by mouth every evening.        Assessment: 72 yo F s/p R-TKA.  To start coumadin for VTE  prophylaxis.  Most recent INR 1.13 as of 6/26.  No coumadin PTA.  Goal of Therapy:  INR 2-3 Monitor platelets by anticoagulation protocol: Yes   Plan:  - Coumadin 5 mg po x 1 - Daily INR - Initiate coumadin education with book/video   Jill Side L. Illene Bolus, PharmD, BCPS Clinical Pharmacist Pager: [phone] Pharmacy: [phone] 04/14/2013 1:24 PM

## 2013-04-14 NOTE — Transfer of Care (Signed)
Immediate Anesthesia Transfer of Care Note  Patient: Janet Williamson  Procedure(s) Performed: Procedure(s): TOTAL KNEE ARTHROPLASTY (Right)  Patient Location: PACU  Anesthesia Type:General  Level of Consciousness: awake, alert  and sedated  Airway & Oxygen Therapy: Patient connected to face mask  Post-op Assessment: Report given to PACU RN  Post vital signs: stable  Complications: No apparent anesthesia complications

## 2013-04-15 ENCOUNTER — Encounter (HOSPITAL_COMMUNITY): Payer: Self-pay | Admitting: General Practice

## 2013-04-15 LAB — CBC
HCT: 29.6 % — ABNORMAL LOW (ref 36.0–46.0)
MCH: 23.9 pg — ABNORMAL LOW (ref 26.0–34.0)
MCHC: 31.8 g/dL (ref 30.0–36.0)
MCV: 75.1 fL — ABNORMAL LOW (ref 78.0–100.0)
RDW: 16.2 % — ABNORMAL HIGH (ref 11.5–15.5)

## 2013-04-15 LAB — BASIC METABOLIC PANEL
BUN: 8 mg/dL (ref 6–23)
Calcium: 8.9 mg/dL (ref 8.4–10.5)
Creatinine, Ser: 0.63 mg/dL (ref 0.50–1.10)
GFR calc Af Amer: >90 mL/min (ref >90)
GFR calc non Af Amer: 87 mL/min — ABNORMAL LOW (ref >90)
Glucose, Bld: 142 mg/dL — ABNORMAL HIGH (ref 70–99)
Potassium: 3.8 mEq/L (ref 3.5–5.1)

## 2013-04-15 LAB — PROTIME-INR: INR: 1.24 (ref 0.00–1.49)

## 2013-04-15 MED ORDER — WARFARIN SODIUM 5 MG PO TABS
5.0000 mg | ORAL_TABLET | Freq: Once | ORAL | Status: AC
  Administered 2013-04-15: 5 mg via ORAL
  Filled 2013-04-15 (×2): qty 1

## 2013-04-15 NOTE — Clinical Social Work Placement (Signed)
Clinical Social Work Department  CLINICAL SOCIAL WORK PLACEMENT NOTE  04/15/2013  Patient: Janet Williamson  Account Number: [id number] Admit date: 04/14/13 Clinical Social Worker: Sabino Niemann MSW Date/time: 04/15/2013 2:00 PM  Clinical Social Work is seeking post-discharge placement for this patient at the following level of care: SKILLED NURSING (*CSW will update this form in Epic as items are completed)  04/15/2013 Patient/family provided with [facility] Department of Clinical Social Work's list of facilities offering this level of care within the geographic area requested by the patient (or if unable, by the patient's family).  04/15/2013  Patient/family informed of their freedom to choose among providers that offer the needed level of care, that participate in Medicare, Medicaid or managed care program needed by the patient, have an available bed and are willing to accept the patient.  04/15/2013  Patient/family informed of [facility]' ownership interest in [facility], as well as of the fact that they are under no obligation to receive care at this facility.  PASARR submitted to EDS on  PASARR number received from EDS on  FL2 transmitted to all facilities in geographic area requested by pt/family on 04/15/2013  FL2 transmitted to all facilities within larger geographic area on  Patient informed that his/her managed care company has contracts with or will negotiate with certain facilities, including the following:  Patient/family informed of bed offers received:  Patient chooses bed at  Physician recommends and patient chooses bed at  Patient to be transferred to on  Patient to be transferred to facility by  The following physician request were entered in Epic:  Additional Comments:

## 2013-04-15 NOTE — Evaluation (Signed)
Occupational Therapy Evaluation Patient Details Name: Janet Williamson MRN: [id number] DOB: 1940/10/12 Today's Date: 04/15/2013 Time: 4259-5638 OT Time Calculation (min): 18 min  OT Assessment / Plan / Recommendation History of present illness  s/p R TKR    Clinical Impression   Pt s/p R TKR thus affecting PLOF. Pt will benefit from continued acute OT services to address below problem list including decreased strength and decreased activity tolerance. Recommending SNF at this time to further progress rehab before eventual return home.    OT Assessment  Patient needs continued OT Services    Follow Up Recommendations  SNF    Barriers to Discharge      Equipment Recommendations  3 in 1 bedside comode    Recommendations for Other Services    Frequency  Min 2X/week    Precautions / Restrictions Precautions Precautions: Knee Required Braces or Orthoses: Knee Immobilizer - Right Restrictions Weight Bearing Restrictions: Yes RLE Weight Bearing: Weight bearing as tolerated Other Position/Activity Restrictions: R LE WBAT   Pertinent Vitals/Pain See vitals    ADL  Eating/Feeding: Performed;Independent Where Assessed - Eating/Feeding: Chair Upper Body Bathing: Simulated;Set up Where Assessed - Upper Body Bathing: Unsupported sitting Lower Body Bathing: Simulated;Moderate assistance Where Assessed - Lower Body Bathing: Supported sit to stand Upper Body Dressing: Simulated;Set up Where Assessed - Upper Body Dressing: Unsupported sitting Lower Body Dressing: Performed;Maximal assistance Where Assessed - Lower Body Dressing: Supported sit to stand Toilet Transfer: Simulated;Minimal assistance Toilet Transfer Method: Sit to Barista:  (chair) Equipment Used: Gait belt;Rolling walker Transfers/Ambulation Related to ADLs: Min assist for sit<>stand from chair x2 trials.  Pt declining to ambulate due to fatigue from previous PT session. ADL Comments: limited by  pain/fatigue    OT Diagnosis: Generalized weakness;Acute pain  OT Problem List: Decreased strength;Decreased activity tolerance;Impaired balance (sitting and/or standing);Decreased knowledge of use of DME or AE;Pain OT Treatment Interventions: Self-care/ADL training;DME and/or AE instruction;Therapeutic activities;Patient/family education;Balance training   OT Goals(Current goals can be found in the care plan section) Acute Rehab OT Goals OT Goal Formulation: With patient Time For Goal Achievement: 04/22/13 Potential to Achieve Goals: Good  Visit Information  Last OT Received On: 04/15/13 Assistance Needed: +2 (for ambulating)       Prior Functioning     Home Living Family/patient expects to be discharged to:: Skilled nursing facility Prior Function Level of Independence: Independent         Vision/Perception     Cognition  Cognition Arousal/Alertness: Awake/alert Behavior During Therapy: WFL for tasks assessed/performed Overall Cognitive Status: Within Functional Limits for tasks assessed    Extremity/Trunk Assessment Upper Extremity Assessment Upper Extremity Assessment: Overall WFL for tasks assessed     Mobility Bed Mobility Bed Mobility: Not assessed Sitting - Scoot to Edge of Bed: 4: Min assist Sit to Supine: 4: Min assist;With rail;HOB flat Details for Bed Mobility Assistance: pt up in chair Transfers Transfers: Sit to Stand;Stand to Sit Sit to Stand: 4: Min assist;From chair/3-in-1;With upper extremity assist;With armrests Stand to Sit: 4: Min assist;To chair/3-in-1;With armrests;With upper extremity assist Details for Transfer Assistance: Assist for power up from chair. VCs for safe hand placement and sequencing.     Exercise     Balance     End of Session OT - End of Session Equipment Utilized During Treatment: Gait belt;Rolling walker;Right knee immobilizer Activity Tolerance: Patient limited by fatigue;Patient limited by pain Patient left: in  chair;with call bell/phone within reach CPM Right Knee CPM Right Knee: Off  GO   04/15/2013 Cipriano Mile OTR/L Pager [phone] Office [phone]   Cipriano Mile 04/15/2013, 3:48 PM

## 2013-04-15 NOTE — Progress Notes (Signed)
Orthopedic Tech Progress Note Patient Details:  Janet Williamson 29-Dec-1940 [id number] Put cpm on at 5:30 pm RLE 0-35 Patient ID: Janet Williamson, female   DOB: Apr 22, 1941, 72 y.o.   MRN: [id number]   Janet Williamson 04/15/2013, 5:24 PM

## 2013-04-15 NOTE — Clinical Social Work Psychosocial (Signed)
Clinical Social Work Department  BRIEF PSYCHOSOCIAL ASSESSMENT  Patient: Janet Williamson  Account Number: [id number]  Admit date: 04/14/13 Clinical Social Worker Sabino Niemann, MSW Date/Time: 04/15/2013 11:00 AM Referred by: Physician Date Referred: 04/15/2013 Referred for   SNF Placement   Other Referral:  Interview type: Patient and patient's family Other interview type: PSYCHOSOCIAL DATA  Living Status: Family Admitted from facility:  Level of care:  Primary support name: Alicia Amel Primary support relationship to patient: daughter Degree of support available:  Strong and vested  CURRENT CONCERNS  Current Concerns   Post-Acute Placement   Other Concerns:  SOCIAL WORK ASSESSMENT / PLAN  CSW met with pt re: PT recommendation for SNF.   Pt lives with family  CSW explained placement process and answered questions.   Pt reports [facility] as her preference    CSW completed FL2 and initiated SNF search.     Assessment/plan status: Information/Referral to [facility]  Other assessment/ plan:  Information/referral to community resources:  SNF   PTAR  PATIENT'S/FAMILY'S RESPONSE TO PLAN OF CARE:  Pt  reports she is agreeable to [facility] SN[facility] in order to increase strength and independence with mobility prior to returning home  Pt verbalized understanding of placement process and appreciation for CSW assist.   Sabino Niemann, MSW [phone]

## 2013-04-15 NOTE — Progress Notes (Signed)
ANTICOAGULATION CONSULT NOTE - Follow Up Consult  Pharmacy Consult for coumadin Indication: VTE prophylaxis  No Known Allergies  Patient Measurements:  Ht: 64" Wt: 88.1 kg  Vital Signs: Temp: 98.6 F (37 C) (07/09 0609) BP: 145/57 mmHg (07/09 0609) Pulse Rate: 67 (07/09 0609)  Labs:  Recent Labs  04/15/13 0545  HGB 9.4*  HCT 29.6*  PLT 276  LABPROT 15.3*  INR 1.24  CREATININE 0.63    CrCl is unknown because there is no height on file for the current visit.   Medical History: Past Medical History  Diagnosis Date  . Diabetes mellitus   . Hypertension   . Elevated cholesterol   . Cardiomyopathy in other disease   . Mitral valve disorder   . Heart murmur   . Generalized osteoarthritis   . Anemia   . Phlebitis   . Thrombophlebitis   . Stroke   . Cataracts, bilateral   . GERD (gastroesophageal reflux disease)   . Aortic stenosis     moderate AS 10/2012 echo (Dr. Anne Fu)    Medications:  Prescriptions prior to admission  Medication Sig Dispense Refill  . amLODipine (NORVASC) 5 MG tablet Take 5 mg by mouth daily.      Marland Kitchen aspirin EC 81 MG tablet Take 81 mg by mouth daily.      . Calcium Carbonate (CALCIUM 600 PO) Take 1 tablet by mouth daily.      . cholecalciferol (VITAMIN D) 1000 UNITS tablet Take 1,000 Units by mouth daily.      . clopidogrel (PLAVIX) 75 MG tablet Take 75 mg by mouth daily.      . hydrochlorothiazide (HYDRODIURIL) 25 MG tablet Take 25 mg by mouth daily.      . metFORMIN (GLUCOPHAGE-XR) 500 MG 24 hr tablet Take 500 mg by mouth daily with breakfast.      . metoprolol succinate (TOPROL-XL) 50 MG 24 hr tablet Take 50 mg by mouth daily. Take with or immediately following a meal.      . ramipril (ALTACE) 10 MG tablet Take 10 mg by mouth daily.      . simvastatin (ZOCOR) 20 MG tablet Take 20 mg by mouth every evening.        Assessment: 72 yo F s/p R-TKA.  To start coumadin for VTE prophylaxis.   No coumadin PTA.  INR today 1.24  Goal of  Therapy:  INR 2-3 Monitor platelets by anticoagulation protocol: Yes   Plan:  - Coumadin 5 mg po x 1 - Daily INR - f/u coumadin education   Talbert Cage, PharmD Clinical Pharmacist Pager: [phone] Pharmacy: [phone] 04/15/2013 11:30 AM

## 2013-04-15 NOTE — Progress Notes (Signed)
Physical medicine rehabilitation consult requested. Patient with elective right total knee replacement 04/14/2013. At this time patient does not meet medical necessity for inpatient rehabilitation services. Physical therapy evaluation completed 04/14/2013 with recommendations for skilled nursing facility

## 2013-04-15 NOTE — Progress Notes (Addendum)
Physical Therapy Treatment Patient Details Name: Janet Williamson MRN: [id number] DOB: 1940/12/20 Today's Date: 04/15/2013 Time: 4098-1191 PT Time Calculation (min): 36 min  PT Assessment / Plan / Recommendation  PT Comments   Pt is making progress toward PT goals since last treatment. Pt is willing to work, but is limited by fatigue & pain. Patient requires multimodal cueing in order to ambulate with proper sequencing and posture. Pt is very apprehensive about bearing weight in her RLE. She will benefit from ST-SNF at d/c in order to improve her strength, mobility, and functional independence.  Follow Up Recommendations  SNF     Does the patient have the potential to tolerate intense rehabilitation     Barriers to Discharge        Equipment Recommendations  Rolling walker with 5" wheels    Recommendations for Other Services    Frequency 7X/week   Progress towards PT Goals Progress towards PT goals: Progressing toward goals  Plan      Precautions / Restrictions Precautions Precautions: Knee Required Braces or Orthoses: Knee Immobilizer - Right Restrictions Weight Bearing Restrictions: Yes RLE Weight Bearing: Weight bearing as tolerated Other Position/Activity Restrictions: R LE WBAT   Pertinent Vitals/Pain Pt reported her R knee pain as 0/10 at rest but c/o knee pain with ambulation-- did not rate. Nursing had given her pain medication prior to Tx.    Mobility  Bed Mobility Bed Mobility: Supine to Sit;Sitting - Scoot to Edge of Bed Supine to Sit: 4: Min assist;With rails;HOB elevated Sitting - Scoot to [place] of Bed: 4: Min assist Details for Bed Mobility Assistance: min assist to bring right hip to EOB. VC for hand placement and sequencing. Transfers Transfers: Sit to Stand;Stand to Sit Sit to Stand: 4: Min assist;With upper extremity assist;With armrests;From chair/3-in-1 Stand to Sit: 4: Min assist;With upper extremity assist;With armrests;To chair/3-in-1 Details for  Transfer Assistance: min assist and VC to achieve an upright position when standing. Pt required min assist to slide Rt foot out with stand>sit Ambulation/Gait Ambulation/Gait Assistance: 4: Min assist Ambulation Distance (Feet): 9 Feet Assistive device: Rolling walker Ambulation/Gait Assistance Details: cues to stand upright, use of UE to help offload RLE, and weight shift to the left when advancing the RLE.  Min assist to lateral weight shift & advance LE's.   Gait Pattern: Step-to pattern;Decreased stride length;Antalgic;Trunk flexed Gait velocity: slow Stairs: No Wheelchair Mobility Wheelchair Mobility: No    Exercises General Exercises - Lower Extremity Ankle Circles/Pumps: AROM;Both;10 reps;Supine Quad Sets: AROM;Right;10 reps;Supine Heel Slides: AAROM;Right;10 reps;Seated Straight Leg Raises: AAROM;Right;10 reps;Supine      PT Goals (current goals can now be found in the care plan section) Acute Rehab PT Goals Time For Goal Achievement: 04/21/13 Potential to Achieve Goals: Good  Visit Information  Last PT Received On: 04/15/13 Assistance Needed: +2 (for safety)    Subjective Data      Cognition  Cognition Arousal/Alertness: Awake/alert Behavior During Therapy: [facility] for tasks assessed/performed Overall Cognitive Status: Within Functional Limits for tasks assessed    Balance     End of Session PT - End of Session Equipment Utilized During Treatment: Gait belt;Right knee immobilizer Activity Tolerance: Patient limited by pain;Patient limited by fatigue Patient left: in chair;with call bell/phone within reach;with family/visitor present   GP     Jolyn Nap, SPTA 04/15/2013, 9:48 AM  Verdell Face, PTA [phone] 04/15/2013

## 2013-04-15 NOTE — Progress Notes (Signed)
Physical Therapy Treatment Patient Details Name: Janet Williamson MRN: [id number] DOB: 12/09/1940 Today's Date: 04/15/2013 Time: 4098-1191 PT Time Calculation (min): 32 min  PT Assessment / Plan / Recommendation  PT Comments   Pt seen sitting EOB with family members present. Pt stated that she was in more pain this afternoon than during the morning Tx session. Pt was able to ambulate slightly further than the previous Tx. Pt continues to be very unsteady when standing. Pt consistently shifts weight in a posterior direction despite repeated cues. At this time, she will best benefit from SNF at d/c in order to increase her strength, balance, mobility, and functional independence.  Follow Up Recommendations  SNF     Does the patient have the potential to tolerate intense rehabilitation     Barriers to Discharge        Equipment Recommendations  Rolling walker with 5" wheels    Recommendations for Other Services    Frequency 7X/week   Progress towards PT Goals Progress towards PT goals: Progressing toward goals  Plan      Precautions / Restrictions Precautions Precautions: Knee Required Braces or Orthoses: Knee Immobilizer - Right Restrictions Weight Bearing Restrictions: Yes RLE Weight Bearing: Weight bearing as tolerated Other Position/Activity Restrictions: R LE WBAT   Pertinent Vitals/Pain Pt rated her R knee pain as 9/10. Nursing gave her some pain medication ~1min prior to PT seeing her.    Mobility  Bed Mobility Bed Mobility: Sitting - Scoot to Edge of Bed;Sit to Supine Sitting - Scoot to Edge of Bed: 4: Min assist Sit to Supine: 4: Min assist;With rail;HOB flat Details for Bed Mobility Assistance: VC for hand placement and sequencing. Min assist to bring both LE's onto bed. Transfers Transfers: Sit to Stand;Stand to Sit Sit to Stand: 4: Min assist;With upper extremity assist;With armrests;From chair/3-in-1 Stand to Sit: 4: Min assist;With upper extremity assist;With  armrests;To chair/3-in-1 Details for Transfer Assistance: min assist and VC to achieve an upright position when standing. Pt required min assist to slide Rt foot out with stand>sit Ambulation/Gait Ambulation/Gait Assistance: 4: Min assist Ambulation Distance (Feet): 15 Feet Assistive device: Rolling walker Ambulation/Gait Assistance Details: cues for posture, weight shifting to advance LE, and sequencing with RW.  Assist to lateral weight shift & advance LE's.   Pt c/o pain is worse this afternoon.  Pt remains with flexed posture & has difficulty tolerating weightbearing through RLE.   Gait Pattern: Step-to pattern;Decreased stride length;Antalgic;Trunk flexed Gait velocity: slow Stairs: No Wheelchair Mobility Wheelchair Mobility: No    Exercises      PT Goals (current goals can now be found in the care plan section) Acute Rehab PT Goals Time For Goal Achievement: 04/21/13 Potential to Achieve Goals: Good  Visit Information  Last PT Received On: 04/15/13 Assistance Needed: +2    Subjective Data      Cognition  Cognition Arousal/Alertness: Awake/alert Behavior During Therapy: WFL for tasks assessed/performed Overall Cognitive Status: Within Functional Limits for tasks assessed    Balance     End of Session PT - End of Session Equipment Utilized During Treatment: Gait belt;Right knee immobilizer Activity Tolerance: Patient limited by pain;Patient limited by fatigue Patient left: in chair;with call bell/phone within reach;with family/visitor present Nurse Communication: Mobility status CPM Right Knee CPM Right Knee: Off   GP     Jolyn Nap, SPTA 04/15/2013, 2:37 PM    Verdell Face, [place] [phone] 04/16/2013

## 2013-04-15 NOTE — Progress Notes (Signed)
Subjective: Pt stable - pain controlled - back on plavix   Objective: Vital signs in last 24 hours: Temp:  [97.5 F (36.4 C)-98.6 F (37 C)] 98.6 F (37 C) (07/09 0609) Pulse Rate:  [50-70] 67 (07/09 0609) Resp:  [8-16] 15 (07/09 0609) BP: (133-164)/(57-75) 145/57 mmHg (07/09 0609) SpO2:  [96 %-100 %] 98 % (07/09 0609)  Intake/Output from previous day: 07/08 0701 - 07/09 0700 In: 1855 [I.V.:1855] Out: 1825 [Urine:1825] Intake/Output this shift:    Exam:  Neurovascular intact Sensation intact distally Intact pulses distally Dorsiflexion/Plantar flexion intact  Labs:  Recent Labs  04/15/13 0545  HGB 9.4*    Recent Labs  04/15/13 0545  WBC 9.5  RBC 3.94  HCT 29.6*  PLT 276    Recent Labs  04/15/13 0545  NA 134*  K 3.8  CL 98  CO2 31  BUN 8  CREATININE 0.63  GLUCOSE 142*  CALCIUM 8.9    Recent Labs  04/15/13 0545  INR 1.24    Assessment/Plan: PT and CPM - dc pca - oral pain meds - oob to chair   DEAN,GREGORY SCOTT 04/15/2013, 7:20 AM

## 2013-04-16 ENCOUNTER — Encounter (HOSPITAL_COMMUNITY): Payer: Self-pay | Admitting: Orthopedic Surgery

## 2013-04-16 LAB — GLUCOSE, CAPILLARY
Glucose-Capillary: 148 mg/dL — ABNORMAL HIGH (ref 70–99)
Glucose-Capillary: 140 mg/dL — ABNORMAL HIGH (ref 70–99)
Glucose-Capillary: 111 mg/dL — ABNORMAL HIGH (ref 70–99)

## 2013-04-16 LAB — PROTIME-INR: INR: 1.84 — ABNORMAL HIGH (ref 0.00–1.49)

## 2013-04-16 LAB — CBC
HCT: 26.5 % — ABNORMAL LOW (ref 36.0–46.0)
Hemoglobin: 8.6 g/dL — ABNORMAL LOW (ref 12.0–15.0)
MCV: 74.6 fL — ABNORMAL LOW (ref 78.0–100.0)
RBC: 3.55 MIL/uL — ABNORMAL LOW (ref 3.87–5.11)
WBC: 13.3 10*3/uL — ABNORMAL HIGH (ref 4.0–10.5)

## 2013-04-16 MED ORDER — WARFARIN SODIUM 2.5 MG PO TABS
2.5000 mg | ORAL_TABLET | Freq: Once | ORAL | Status: AC
  Administered 2013-04-16: 2.5 mg via ORAL
  Filled 2013-04-16: qty 1

## 2013-04-16 NOTE — Progress Notes (Signed)
ANTICOAGULATION CONSULT NOTE - Follow Up Consult  Pharmacy Consult for coumadin Indication: VTE prophylaxis  No Known Allergies  Patient Measurements:  Ht: 64" Wt: 88.1 kg  Vital Signs: Temp: 98.9 F (37.2 C) (07/10 0632) BP: 106/47 mmHg (07/10 0632) Pulse Rate: 78 (07/10 0632)  Labs:  Recent Labs  04/15/13 0545 04/16/13 0520  HGB 9.4* 8.6*  HCT 29.6* 26.5*  PLT 276 248  LABPROT 15.3* 20.7*  INR 1.24 1.84*  CREATININE 0.63  --     CrCl is unknown because there is no height on file for the current visit.   Medical History: Past Medical History  Diagnosis Date  . Diabetes mellitus   . Hypertension   . Elevated cholesterol   . Cardiomyopathy in other disease   . Mitral valve disorder   . Heart murmur   . Generalized osteoarthritis   . Anemia   . Phlebitis   . Thrombophlebitis   . Stroke   . Cataracts, bilateral   . GERD (gastroesophageal reflux disease)   . Aortic stenosis     moderate AS 10/2012 echo (Dr. Anne Fu)    Medications:  Prescriptions prior to admission  Medication Sig Dispense Refill  . amLODipine (NORVASC) 5 MG tablet Take 5 mg by mouth daily.      Marland Kitchen aspirin EC 81 MG tablet Take 81 mg by mouth daily.      . Calcium Carbonate (CALCIUM 600 PO) Take 1 tablet by mouth daily.      . cholecalciferol (VITAMIN D) 1000 UNITS tablet Take 1,000 Units by mouth daily.      . clopidogrel (PLAVIX) 75 MG tablet Take 75 mg by mouth daily.      . hydrochlorothiazide (HYDRODIURIL) 25 MG tablet Take 25 mg by mouth daily.      . metFORMIN (GLUCOPHAGE-XR) 500 MG 24 hr tablet Take 500 mg by mouth daily with breakfast.      . metoprolol succinate (TOPROL-XL) 50 MG 24 hr tablet Take 50 mg by mouth daily. Take with or immediately following a meal.      . ramipril (ALTACE) 10 MG tablet Take 10 mg by mouth daily.      . simvastatin (ZOCOR) 20 MG tablet Take 20 mg by mouth every evening.        Assessment: 72 yo F s/p R-TKA.  On coumadin for VTE prophylaxis.   No  coumadin PTA.  INR today 1.84. Progressing towards goal INR, with large increase overnight. No complications noted.   Goal of Therapy:  INR 2-3 Monitor platelets by anticoagulation protocol: Yes   Plan:  - Coumadin 2.5 mg po x 1 - Daily INR - f/u coumadin education   Twania Bujak C. Eitan Doubleday, PharmD Clinical Pharmacist-Resident Pager: [phone] Pharmacy: [phone] 04/16/2013 1:29 PM

## 2013-04-16 NOTE — Progress Notes (Signed)
Subjective: Pt stable - pain controlled   Objective: Vital signs in last 24 hours: Temp:  [98.9 F (37.2 C)-101.2 F (38.4 C)] 98.9 F (37.2 C) (07/10 0981) Pulse Rate:  [77-89] 78 (07/10 0632) Resp:  [12-17] 16 (07/10 1914) BP: (106-165)/(47-56) 106/47 mmHg (07/10 0632) SpO2:  [94 %-97 %] 96 % (07/10 7829)  Intake/Output from previous day: 07/09 0701 - 07/10 0700 In: 840 [P.O.:840] Out: -  Intake/Output this shift:    Exam:  Neurovascular intact Sensation intact distally Intact pulses distally Dorsiflexion/Plantar flexion intact  Labs:  Recent Labs  04/15/13 0545 04/16/13 0520  HGB 9.4* 8.6*    Recent Labs  04/15/13 0545 04/16/13 0520  WBC 9.5 13.3*  RBC 3.94 3.55*  HCT 29.6* 26.5*  PLT 276 248    Recent Labs  04/15/13 0545  NA 134*  K 3.8  CL 98  CO2 31  BUN 8  CREATININE 0.63  GLUCOSE 142*  CALCIUM 8.9    Recent Labs  04/15/13 0545 04/16/13 0520  INR 1.24 1.84*    Assessment/Plan: Pt stable - mobilizing but needs more rom on cpm - plan dc to snf friday   Janet Williamson 04/16/2013, 8:07 AM

## 2013-04-16 NOTE — Progress Notes (Signed)
Physical Therapy Treatment Patient Details Name: Janet Williamson MRN: [id number] DOB: [birth year] Today's Date: 04/16/2013 Time: 8413-2440 PT Time Calculation (min): 24 min  PT Assessment / Plan / Recommendation  PT Comments   Pt progressing toward PT goals with increased ambulation distance and quality. Pt is requiring fewer cues for proper gait pattern with RW. Pt continues to be unsteady when sit-->stand. She stated that she is very nervous about using her RLE. Pt will continue to follow.  Follow Up Recommendations  SNF     Does the patient have the potential to tolerate intense rehabilitation     Barriers to Discharge        Equipment Recommendations  Rolling walker with 5" wheels    Recommendations for Other Services    Frequency 7X/week   Progress towards PT Goals Progress towards PT goals: Progressing toward goals  Plan      Precautions / Restrictions Precautions Precautions: Knee Required Braces or Orthoses: Knee Immobilizer - Right Restrictions Weight Bearing Restrictions: Yes RLE Weight Bearing: Weight bearing as tolerated Other Position/Activity Restrictions: R LE WBAT   Pertinent Vitals/Pain Pt reported that she was not feeling any pain this morning.   Mobility  Bed Mobility Bed Mobility: Supine to Sit;Sitting - Scoot to Edge of Bed Supine to Sit: 4: Min assist;With rails;HOB elevated Sitting - Scoot to Edge of Bed: 4: Min assist Details for Bed Mobility Assistance: increased time and effort. VC for handplacement and min assist to move RLE. Transfers Transfers: Sit to Stand;Stand to Sit Sit to Stand: 4: Min assist;With upper extremity assist;From bed Stand to Sit: 4: Min assist;With armrests;To chair/3-in-1 Details for Transfer Assistance: pt continues to be unsteady when standing. She is very apprehensive about initial weight bearing through RLE and stays flexed in the trunk. Ambulation/Gait Ambulation/Gait Assistance: 4: Min assist Ambulation Distance  (Feet): 25 Feet Assistive device: Rolling walker Ambulation/Gait Assistance Details: Pt needs occasional cues for proper sequencing with RW.  Pt needs encouragement to take weight through her RLE. Gait Pattern: Step-to pattern;Decreased stride length;Antalgic;Trunk flexed Gait velocity: slow Stairs: No Wheelchair Mobility Wheelchair Mobility: No    Exercises        PT Goals (current goals can now be found in the care plan section) Acute Rehab PT Goals Time For Goal Achievement: 04/21/13 Potential to Achieve Goals: Good  Visit Information  Last PT Received On: 04/16/13 Assistance Needed: +2 (for ambulation)    Subjective Data      Cognition  Cognition Arousal/Alertness: Awake/alert Behavior During Therapy: WFL for tasks assessed/performed Overall Cognitive Status: Within Functional Limits for tasks assessed    Balance     End of Session PT - End of Session Equipment Utilized During Treatment: Gait belt;Right knee immobilizer Activity Tolerance: Patient limited by fatigue Patient left: in chair;with call bell/phone within reach;with family/visitor present Nurse Communication: Mobility status   GP     Jolyn Nap, SPTA 04/16/2013, 8:11 AM \   Verdell Face, PTA [phone] 04/16/2013

## 2013-04-16 NOTE — Progress Notes (Signed)
Physical Therapy Treatment Patient Details Name: Janet Williamson MRN: [id number] DOB: [birth year] Today's Date: 04/16/2013 Time: 4098-1191 PT Time Calculation (min): 26 min  PT Assessment / Plan / Recommendation  PT Comments   Pt making better progress today with increased ambulation distance and quality of movement. She is requiring less cueing during ambulation and exhibiting less fear about bearing weight through her RLE. Pt continues to be unsteady during sit to stand, but is needing fewer cues.  Increased time due to assisting pt to bathroom.  PT will continue to follow.  Follow Up Recommendations  SNF     Does the patient have the potential to tolerate intense rehabilitation     Barriers to Discharge        Equipment Recommendations  Rolling walker with 5" wheels    Recommendations for Other Services    Frequency 7X/week   Progress towards PT Goals Progress towards PT goals: Progressing toward goals  Plan      Precautions / Restrictions Precautions Precautions: Knee Required Braces or Orthoses: Knee Immobilizer - Right Restrictions Weight Bearing Restrictions: Yes RLE Weight Bearing: Weight bearing as tolerated Other Position/Activity Restrictions: R LE WBAT   Pertinent Vitals/Pain Patient reported no pain in her RLE.    Mobility  Bed Mobility Bed Mobility: Supine to Sit;Sitting - Scoot to Edge of Bed Supine to Sit: 4: Min assist Sitting - Scoot to Edge of Bed: 4: Min assist Details for Bed Mobility Assistance: improving time and effort level. Still need min assist to bring RLE to EOB. Transfers Transfers: Sit to Stand;Stand to Sit Sit to Stand: 4: Min assist;With upper extremity assist;From bed Stand to Sit: 4: Min assist;With armrests;To chair/3-in-1 Details for Transfer Assistance: cues for hand placement and to bring RLE under her body. Ambulation/Gait Ambulation/Gait Assistance: 4: Min assist Ambulation Distance (Feet): 40 Feet Assistive device: Rolling  walker Ambulation/Gait Assistance Details: pt less apprehensive about bearing weight through her LE. Posture has also improved, pt now standing upright with improved balance. min cueing needed for walker/gait sequencing. Gait Pattern: Step-to pattern;Decreased stride length Gait velocity: slow Stairs: No Wheelchair Mobility Wheelchair Mobility: No    Exercises        PT Goals (current goals can now be found in the care plan section) Acute Rehab PT Goals Time For Goal Achievement: 04/21/13 Potential to Achieve Goals: Good  Visit Information  Last PT Received On: 04/16/13 Assistance Needed: +1    Subjective Data      Cognition  Cognition Arousal/Alertness: Awake/alert Behavior During Therapy: WFL for tasks assessed/performed Overall Cognitive Status: Within Functional Limits for tasks assessed    Balance     End of Session PT - End of Session Equipment Utilized During Treatment: Gait belt;Right knee immobilizer Activity Tolerance: Patient limited by fatigue Patient left: in chair;with call bell/phone within reach;with family/visitor present Nurse Communication: Mobility status CPM Right Knee CPM Right Knee: On Right Knee Flexion (Degrees): 40 Right Knee Extension (Degrees): 0   GP     Jolyn Nap, SPTA 04/16/2013, 11:54 AM   Verdell Face, PTA [phone] 04/16/2013

## 2013-04-17 LAB — CBC
HCT: 25.6 % — ABNORMAL LOW (ref 36.0–46.0)
MCH: 23.8 pg — ABNORMAL LOW (ref 26.0–34.0)
MCHC: 32 g/dL (ref 30.0–36.0)
RDW: 15.9 % — ABNORMAL HIGH (ref 11.5–15.5)

## 2013-04-17 LAB — PROTIME-INR
INR: 1.85 — ABNORMAL HIGH (ref 0.00–1.49)
Prothrombin Time: 20.8 seconds — ABNORMAL HIGH (ref 11.6–15.2)

## 2013-04-17 LAB — GLUCOSE, CAPILLARY: Glucose-Capillary: 126 mg/dL — ABNORMAL HIGH (ref 70–99)

## 2013-04-17 MED ORDER — WARFARIN SODIUM 5 MG PO TABS: 5.0000 mg | ORAL_TABLET | Freq: Every day | ORAL | Status: DC

## 2013-04-17 MED ORDER — OXYCODONE HCL 5 MG PO TABS: 5.0000 mg | ORAL_TABLET | ORAL | Status: DC | PRN

## 2013-04-17 MED ORDER — DSS 100 MG PO CAPS: 100.0000 mg | ORAL_CAPSULE | Freq: Two times a day (BID) | ORAL | Status: DC

## 2013-04-17 MED ORDER — METHOCARBAMOL 500 MG PO TABS: 500.0000 mg | ORAL_TABLET | Freq: Four times a day (QID) | ORAL | Status: DC | PRN

## 2013-04-17 NOTE — Progress Notes (Signed)
Physical Therapy Treatment Patient Details Name: Janet Williamson MRN: [id number] DOB: Jan 11, 1941 Today's Date: 04/17/2013 Time: 4098-1191 PT Time Calculation (min): 29 min  PT Assessment / Plan / Recommendation  PT Comments   Patient making progress with ambulation this morning. States she feels as if she is getting some of her energy back. Patient planning to DC to [facility] later today  Follow Up Recommendations  SNF     Does the patient have the potential to tolerate intense rehabilitation     Barriers to Discharge        Equipment Recommendations  Rolling walker with 5" wheels    Recommendations for Other Services    Frequency 7X/week   Progress towards PT Goals Progress towards PT goals: Progressing toward goals  Plan Current plan remains appropriate    Precautions / Restrictions Precautions Precautions: Knee Required Braces or Orthoses: Knee Immobilizer - Right Restrictions Weight Bearing Restrictions: Yes RLE Weight Bearing: Weight bearing as tolerated   Pertinent Vitals/Pain Denied pain. Premedicated    Mobility  Bed Mobility Supine to Sit: 4: Min assist Details for Bed Mobility Assistance: A for LE out of bed Transfers Sit to Stand: 4: Min guard;With upper extremity assist;From chair/3-in-1;From bed Stand to Sit: 4: Min guard;With upper extremity assist;To chair/3-in-1 Details for Transfer Assistance: cues for hand placement and to bring RLE under her body to correct forward lean  Ambulation/Gait Ambulation/Gait Assistance: 4: Min guard Ambulation Distance (Feet): 80 Feet Assistive device: Rolling walker Ambulation/Gait Assistance Details: Cues to increase step length and positoning of RW Gait Pattern: Step-to pattern;Decreased step length - right;Decreased step length - left Gait velocity: decreased    Exercises Total Joint Exercises Quad Sets: AROM;Right;10 reps Short Arc QuadBarbaraann Boys;Right;10 reps Heel Slides: AAROM;Right;10 reps Hip ABduction/ADduction:  AAROM;Right;10 reps Straight Leg Raises: AAROM;Right;10 reps   PT Diagnosis:    PT Problem List:   PT Treatment Interventions:     PT Goals (current goals can now be found in the care plan section)    Visit Information  Last PT Received On: 04/17/13 Assistance Needed: +1    Subjective Data      Cognition  Cognition Arousal/Alertness: Awake/alert Behavior During Therapy: WFL for tasks assessed/performed Overall Cognitive Status: Within Functional Limits for tasks assessed    Balance     End of Session PT - End of Session Equipment Utilized During Treatment: Gait belt;Right knee immobilizer Activity Tolerance: Patient tolerated treatment well Patient left: in chair;with call bell/phone within reach Nurse Communication: Mobility status CPM Right Knee CPM Right Knee: On   GP     Fredrich Birks 04/17/2013, 9:31 AM  04/17/2013 Fredrich Birks PTA [phone] pager [phone] office

## 2013-04-17 NOTE — Discharge Summary (Signed)
Physician Discharge Summary  Patient ID: Janet Williamson MRN: [id number] DOB/AGE: 10/14/1940 72 y.o.  Admit date: 04/14/2013 Discharge date: 04/17/2013  Admission Diagnoses:  Knee arthritis  Discharge Diagnoses:  Same  Surgeries: Procedure(s): TOTAL KNEE ARTHROPLASTY on 04/14/2013   Consultants:    Discharged Condition: Stable  Hospital Course: Janet Williamson is an 72 y.o. female who was admitted 04/14/2013 with a chief complaint of right knee pain, and found to have a diagnosis of knee arthritis.  They were brought to the operating room on 04/14/2013 and underwent the above named procedures. She began to mobilize with PT after surgery. Plavix resumed and coumadin started for dvt prophylaxis, filter also in place.   Antibiotics given:  Anti-infectives   Start     Dose/Rate Route Frequency Ordered Stop   04/14/13 1400  ceFAZolin (ANCEF) IVPB 2 g/50 mL premix     2 g 100 mL/hr over 30 Minutes Intravenous Every 6 hours 04/14/13 1253 04/14/13 2129   04/14/13 0600  ceFAZolin (ANCEF) IVPB 2 g/50 mL premix     2 g 100 mL/hr over 30 Minutes Intravenous On call to O.R. 04/13/13 1444 04/14/13 0747    .  Recent vital signs:  Filed Vitals:   04/17/13 0456  BP: 126/54  Pulse: 84  Temp: 98.4 F (36.9 C)  Resp: 14    Recent laboratory studies:  Results for orders placed during the hospital encounter of 04/14/13  GLUCOSE, CAPILLARY      Result Value Range   Glucose-Capillary 118 (*) 70 - 99 mg/dL  GLUCOSE, CAPILLARY      Result Value Range   Glucose-Capillary 132 (*) 70 - 99 mg/dL   Comment 1 Notify RN    GLUCOSE, CAPILLARY      Result Value Range   Glucose-Capillary 138 (*) 70 - 99 mg/dL   Comment 1 Notify RN     Comment 2 Documented in Chart    GLUCOSE, CAPILLARY      Result Value Range   Glucose-Capillary 125 (*) 70 - 99 mg/dL   Comment 1 Notify RN     Comment 2 Documented in Chart    PROTIME-INR      Result Value Range   Prothrombin Time 15.3 (*) 11.6 - 15.2 seconds   INR 1.24  0.00 - 1.49  CBC      Result Value Range   WBC 9.5  4.0 - 10.5 K/uL   RBC 3.94  3.87 - 5.11 MIL/uL   Hemoglobin 9.4 (*) 12.0 - 15.0 g/dL   HCT 13.0 (*) 86.5 - 78.4 %   MCV 75.1 (*) 78.0 - 100.0 fL   MCH 23.9 (*) 26.0 - 34.0 pg   MCHC 31.8  30.0 - 36.0 g/dL   RDW 69.6 (*) 29.5 - 28.4 %   Platelets 276  150 - 400 K/uL  BASIC METABOLIC PANEL      Result Value Range   Sodium 134 (*) 135 - 145 mEq/L   Potassium 3.8  3.5 - 5.1 mEq/L   Chloride 98  96 - 112 mEq/L   CO2 31  19 - 32 mEq/L   Glucose, Bld 142 (*) 70 - 99 mg/dL   BUN 8  6 - 23 mg/dL   Creatinine, Ser 1.32  0.50 - 1.10 mg/dL   Calcium 8.9  8.4 - 44.0 mg/dL   GFR calc non Af Amer 87 (*) >90 mL/min   GFR calc Af Amer >90  >90 mL/min  GLUCOSE, CAPILLARY  Result Value Range   Glucose-Capillary 122 (*) 70 - 99 mg/dL  GLUCOSE, CAPILLARY      Result Value Range   Glucose-Capillary 140 (*) 70 - 99 mg/dL  GLUCOSE, CAPILLARY      Result Value Range   Glucose-Capillary 137 (*) 70 - 99 mg/dL  GLUCOSE, CAPILLARY      Result Value Range   Glucose-Capillary 129 (*) 70 - 99 mg/dL  PROTIME-INR      Result Value Range   Prothrombin Time 20.7 (*) 11.6 - 15.2 seconds   INR 1.84 (*) 0.00 - 1.49  CBC      Result Value Range   WBC 13.3 (*) 4.0 - 10.5 K/uL   RBC 3.55 (*) 3.87 - 5.11 MIL/uL   Hemoglobin 8.6 (*) 12.0 - 15.0 g/dL   HCT 16.1 (*) 09.6 - 04.5 %   MCV 74.6 (*) 78.0 - 100.0 fL   MCH 24.2 (*) 26.0 - 34.0 pg   MCHC 32.5  30.0 - 36.0 g/dL   RDW 40.9 (*) 81.1 - 91.4 %   Platelets 248  150 - 400 K/uL  GLUCOSE, CAPILLARY      Result Value Range   Glucose-Capillary 120 (*) 70 - 99 mg/dL  GLUCOSE, CAPILLARY      Result Value Range   Glucose-Capillary 129 (*) 70 - 99 mg/dL  GLUCOSE, CAPILLARY      Result Value Range   Glucose-Capillary 134 (*) 70 - 99 mg/dL   Comment 1 Documented in Chart     Comment 2 Notify RN    GLUCOSE, CAPILLARY      Result Value Range   Glucose-Capillary 148 (*) 70 - 99 mg/dL  GLUCOSE,  CAPILLARY      Result Value Range   Glucose-Capillary 140 (*) 70 - 99 mg/dL   Comment 1 Documented in Chart     Comment 2 Notify RN    PROTIME-INR      Result Value Range   Prothrombin Time 20.8 (*) 11.6 - 15.2 seconds   INR 1.85 (*) 0.00 - 1.49  CBC      Result Value Range   WBC 12.7 (*) 4.0 - 10.5 K/uL   RBC 3.45 (*) 3.87 - 5.11 MIL/uL   Hemoglobin 8.2 (*) 12.0 - 15.0 g/dL   HCT 78.2 (*) 95.6 - 21.3 %   MCV 74.2 (*) 78.0 - 100.0 fL   MCH 23.8 (*) 26.0 - 34.0 pg   MCHC 32.0  30.0 - 36.0 g/dL   RDW 08.6 (*) 57.8 - 46.9 %   Platelets 243  150 - 400 K/uL  GLUCOSE, CAPILLARY      Result Value Range   Glucose-Capillary 111 (*) 70 - 99 mg/dL  GLUCOSE, CAPILLARY      Result Value Range   Glucose-Capillary 126 (*) 70 - 99 mg/dL  TYPE AND SCREEN      Result Value Range   ABO/RH(D) O POS     Antibody Screen POS     Sample Expiration 04/17/2013      Discharge Medications:     Medication List    STOP taking these medications       aspirin EC 81 MG tablet      TAKE these medications       amLODipine 5 MG tablet  Commonly known as:  NORVASC  Take 5 mg by mouth daily.     CALCIUM 600 PO  Take 1 tablet by mouth daily.     cholecalciferol 1000 UNITS tablet  Commonly known as:  VITAMIN D  Take 1,000 Units by mouth daily.     clopidogrel 75 MG tablet  Commonly known as:  PLAVIX  Take 75 mg by mouth daily.     DSS 100 MG Caps  Take 100 mg by mouth 2 (two) times daily.     hydrochlorothiazide 25 MG tablet  Commonly known as:  HYDRODIURIL  Take 25 mg by mouth daily.     metFORMIN 500 MG 24 hr tablet  Commonly known as:  GLUCOPHAGE-XR  Take 500 mg by mouth daily with breakfast.     methocarbamol 500 MG tablet  Commonly known as:  ROBAXIN  Take 1 tablet (500 mg total) by mouth every 6 (six) hours as needed.     metoprolol succinate 50 MG 24 hr tablet  Commonly known as:  TOPROL-XL  Take 50 mg by mouth daily. Take with or immediately following a meal.      oxyCODONE 5 MG immediate release tablet  Commonly known as:  Oxy IR/ROXICODONE  Take 1-2 tablets (5-10 mg total) by mouth every 3 (three) hours as needed.     ramipril 10 MG tablet  Commonly known as:  ALTACE  Take 10 mg by mouth daily.     simvastatin 20 MG tablet  Commonly known as:  ZOCOR  Take 20 mg by mouth every evening.     warfarin 5 MG tablet  Commonly known as:  COUMADIN  Take 1 tablet (5 mg total) by mouth daily.        Diagnostic Studies: Dg Chest 2 View  04/03/2013   *RADIOLOGY REPORT*  Clinical Data: Hypertension  CHEST - 2 VIEW  Comparison: April 15, 2010.  Findings: Cardiomediastinal silhouette appears normal.  No acute pulmonary disease is noted.  No pleural effusion or pneumothorax is noted.  Degenerative changes of lower thoracic spine are noted. Bony thorax is otherwise intact.  IMPRESSION: No acute cardiopulmonary abnormality seen.   Original Report Authenticated By: Lupita Raider.,  M.D.   Ir Ivc Filter Plmt / S&i /img Guid/mod Sed  04/02/2013   *RADIOLOGY REPORT*  Clinical Data:  History of right lower extremity DVT in 2008.  The patient is scheduled for elective right knee arthroplasty on 04/14/2013 and requires placement of a prophylactic IVC filter due to potential prolonged recovery and plans to not administer anticoagulation following the procedure.  1.  ULTRASOUND GUIDANCE FOR VASCULAR ACCESS OF THE RIGHT INTERNAL JUGULAR VEIN. 2.  IVC VENOGRAM 3.  PERCUTANEOUS IVC FILTER PLACEMENT  Sedation:  2.0 mg IV Versed; 75 mcg IV Fentanyl.  Total Moderate Sedation Time:  14 minutes.  Contrast:  34 ml Omnipaque-300  Fluoroscopy Time: 1 minute, 36 seconds.  Procedure:  The procedure, risks, benefits, and alternatives were explained to the patient.  Questions regarding the procedure were encouraged and answered.  The patient understands and consents to the procedure.  The right neck was prepped with Betadine in a sterile fashion, and a sterile drape was applied covering the  operative field.  A sterile gown and sterile gloves were used for the procedure.  Local anesthesia was provided with 1% Lidocaine.  Under direct ultrasound guidance, a 21 gauge needle was advanced into the right internal jugular vein with ultrasound image documentation performed.  After securing access with a micropuncture dilator, a guidewire was advanced into the inferior vena cava.  A deployment sheath was advanced over the guidewire. This was utilized to perform IVC venography.  The deployment sheath was further positioned  in an appropriate location for filter deployment.  A Bard Denali IVC filter was then advanced in the sheath.  This was then fully deployed in the infrarenal IVC.  Final filter position was confirmed with a fluoroscopic spot image.  Contrast injection was also performed through the sheath under fluoroscopy to confirm patency of the IVC at the level of the filter.  After the procedure the sheath was removed and hemostasis obtained with manual compression.  Complications: None  Findings:  IVC venography demonstrates a normal caliber IVC with no evidence of thrombus.  Renal veins are identified bilaterally.  The IVC filter was successfully positioned below the level of the renal veins and is appropriately oriented.  This IVC filter has both permanent and retrievable indications.  IMPRESSION: Placement of percutaneous IVC filter in infrarenal IVC.  IVC venogram shows no evidence of IVC thrombus and normal caliber of the inferior vena cava.  This filter does have both permanent and retrievable indications.   Original Report Authenticated By: Irish Lack, M.D.    Disposition: 01-Home or Self Care      Discharge Orders   Future Appointments Provider Department Dept Phone   06/09/2013 9:30 AM [facility] [place] [phone]   Future Orders Complete By Expires     Call MD / Call 911  As directed     Comments:      If you experience chest pain or shortness of  breath, CALL 911 and be transported to the hospital emergency room.  If you develope a fever above 101 F, pus (white drainage) or increased drainage or redness at the wound, or calf pain, call your surgeon's office.    Constipation Prevention  As directed     Comments:      Drink plenty of fluids.  Prune juice may be helpful.  You may use a stool softener, such as Colace (over the counter) 100 mg twice a day.  Use MiraLax (over the counter) for constipation as needed.    Diet - low sodium heart healthy  As directed     Discharge instructions  As directed     Comments:      Weight bearing as tolerated right leg CPM 6 hours per day Keep incision dry    Increase activity slowly as tolerated  As directed           Signed: DEAN,GREGORY SCOTT 04/17/2013, 7:54 AM

## 2013-04-17 NOTE — Progress Notes (Signed)
Called report to Wes at [facility].  All questions answered.

## 2013-04-17 NOTE — Progress Notes (Signed)
Subjective: Pt stable - pain controlled - incision ok dressing changed today   Objective: Vital signs in last 24 hours: Temp:  [98.4 F (36.9 C)-100.3 F (37.9 C)] 98.4 F (36.9 C) (07/11 0456) Pulse Rate:  [76-85] 84 (07/11 0456) Resp:  [14-18] 14 (07/11 0456) BP: (106-126)/(45-58) 126/54 mmHg (07/11 0456) SpO2:  [94 %-99 %] 97 % (07/11 0456)  Intake/Output from previous day: 07/10 0701 - 07/11 0700 In: 840 [P.O.:840] Out: -  Intake/Output this shift:    Exam:  Neurovascular intact Sensation intact distally Intact pulses distally Dorsiflexion/Plantar flexion intact  Labs:  Recent Labs  04/15/13 0545 04/16/13 0520 04/17/13 0545  HGB 9.4* 8.6* 8.2*    Recent Labs  04/16/13 0520 04/17/13 0545  WBC 13.3* 12.7*  RBC 3.55* 3.45*  HCT 26.5* 25.6*  PLT 248 243    Recent Labs  04/15/13 0545  NA 134*  K 3.8  CL 98  CO2 31  BUN 8  CREATININE 0.63  GLUCOSE 142*  CALCIUM 8.9    Recent Labs  04/16/13 0520 04/17/13 0545  INR 1.84* 1.85*    Assessment/Plan: Ready for dc to snf today if bed available   Rogenia Werntz SCOTT 04/17/2013, 7:48 AM

## 2013-04-18 LAB — TYPE AND SCREEN
Donor AG Type: NEGATIVE
PT AG Type: NEGATIVE
Unit division: 0

## 2013-05-06 ENCOUNTER — Inpatient Hospital Stay (HOSPITAL_COMMUNITY)
Admission: EM | Admit: 2013-05-06 | Discharge: 2013-05-07 | DRG: 916 | Disposition: A | Discharge status: Home / Self Care | Payer: PRIVATE HEALTH INSURANCE | Attending: Internal Medicine | Admitting: Internal Medicine

## 2013-05-06 ENCOUNTER — Encounter (HOSPITAL_COMMUNITY): Payer: Self-pay | Admitting: Emergency Medicine

## 2013-05-06 DIAGNOSIS — M159 Polyosteoarthritis, unspecified: Secondary | ICD-10-CM | POA: Diagnosis present

## 2013-05-06 DIAGNOSIS — Z7902 Long term (current) use of antithrombotics/antiplatelets: Secondary | ICD-10-CM

## 2013-05-06 DIAGNOSIS — I350 Nonrheumatic aortic (valve) stenosis: Secondary | ICD-10-CM | POA: Diagnosis present

## 2013-05-06 DIAGNOSIS — E789 Disorder of lipoprotein metabolism, unspecified: Secondary | ICD-10-CM

## 2013-05-06 DIAGNOSIS — T783XXA Angioneurotic edema, initial encounter: Principal | ICD-10-CM

## 2013-05-06 DIAGNOSIS — I059 Rheumatic mitral valve disease, unspecified: Secondary | ICD-10-CM | POA: Diagnosis present

## 2013-05-06 DIAGNOSIS — Z96659 Presence of unspecified artificial knee joint: Secondary | ICD-10-CM

## 2013-05-06 DIAGNOSIS — Z79899 Other long term (current) drug therapy: Secondary | ICD-10-CM

## 2013-05-06 DIAGNOSIS — E7800 Pure hypercholesterolemia, unspecified: Secondary | ICD-10-CM | POA: Diagnosis present

## 2013-05-06 DIAGNOSIS — I428 Other cardiomyopathies: Secondary | ICD-10-CM | POA: Diagnosis present

## 2013-05-06 DIAGNOSIS — E785 Hyperlipidemia, unspecified: Secondary | ICD-10-CM | POA: Diagnosis present

## 2013-05-06 DIAGNOSIS — K219 Gastro-esophageal reflux disease without esophagitis: Secondary | ICD-10-CM | POA: Diagnosis present

## 2013-05-06 DIAGNOSIS — Z8673 Personal history of transient ischemic attack (TIA), and cerebral infarction without residual deficits: Secondary | ICD-10-CM

## 2013-05-06 DIAGNOSIS — T46905A Adverse effect of unspecified agents primarily affecting the cardiovascular system, initial encounter: Secondary | ICD-10-CM | POA: Diagnosis present

## 2013-05-06 DIAGNOSIS — D649 Anemia, unspecified: Secondary | ICD-10-CM | POA: Diagnosis present

## 2013-05-06 DIAGNOSIS — E119 Type 2 diabetes mellitus without complications: Secondary | ICD-10-CM | POA: Diagnosis present

## 2013-05-06 DIAGNOSIS — I359 Nonrheumatic aortic valve disorder, unspecified: Secondary | ICD-10-CM | POA: Diagnosis present

## 2013-05-06 DIAGNOSIS — I10 Essential (primary) hypertension: Secondary | ICD-10-CM | POA: Diagnosis present

## 2013-05-06 LAB — CBC WITH DIFFERENTIAL/PLATELET
Basophils Absolute: 0 10*3/uL (ref 0.0–0.1)
Basophils Relative: 0 % (ref 0–1)
MCHC: 32.4 g/dL (ref 30.0–36.0)
Monocytes Absolute: 0.6 10*3/uL (ref 0.1–1.0)
Neutro Abs: 7.7 10*3/uL (ref 1.7–7.7)
Neutrophils Relative %: 70 % (ref 43–77)
Platelets: 551 10*3/uL — ABNORMAL HIGH (ref 150–400)
RDW: 16.6 % — ABNORMAL HIGH (ref 11.5–15.5)

## 2013-05-06 LAB — BASIC METABOLIC PANEL
Chloride: 96 mEq/L (ref 96–112)
Creatinine, Ser: 0.67 mg/dL (ref 0.50–1.10)
GFR calc Af Amer: >90 mL/min (ref >90)
Potassium: 3.7 mEq/L (ref 3.5–5.1)

## 2013-05-06 LAB — CBC
HCT: 30.5 % — ABNORMAL LOW (ref 36.0–46.0)
MCH: 24.4 pg — ABNORMAL LOW (ref 26.0–34.0)
MCHC: 32.8 g/dL (ref 30.0–36.0)
RDW: 16.4 % — ABNORMAL HIGH (ref 11.5–15.5)

## 2013-05-06 LAB — CREATININE, SERUM: GFR calc non Af Amer: 90 mL/min — ABNORMAL LOW (ref >90)

## 2013-05-06 LAB — MRSA PCR SCREENING: MRSA by PCR: NEGATIVE

## 2013-05-06 LAB — GLUCOSE, CAPILLARY: Glucose-Capillary: 139 mg/dL — ABNORMAL HIGH (ref 70–99)

## 2013-05-06 LAB — RETICULOCYTES: Retic Count, Absolute: 94.3 10*3/uL (ref 19.0–186.0)

## 2013-05-06 MED ORDER — VITAMIN D3 25 MCG (1000 UNIT) PO TABS
1000.0000 [IU] | ORAL_TABLET | Freq: Every day | ORAL | Status: DC
  Administered 2013-05-07: 1000 [IU] via ORAL
  Filled 2013-05-06: qty 1

## 2013-05-06 MED ORDER — ACETAMINOPHEN 650 MG RE SUPP: 650.0000 mg | Freq: Four times a day (QID) | RECTAL | Status: DC | PRN

## 2013-05-06 MED ORDER — OXYCODONE HCL 5 MG PO TABS
5.0000 mg | ORAL_TABLET | ORAL | Status: DC | PRN
  Administered 2013-05-07: 5 mg via ORAL
  Filled 2013-05-06: qty 1

## 2013-05-06 MED ORDER — CLOPIDOGREL BISULFATE 75 MG PO TABS
75.0000 mg | ORAL_TABLET | Freq: Every day | ORAL | Status: DC
Start: 2013-05-07 — End: 2013-05-07
  Administered 2013-05-07: 75 mg via ORAL
  Filled 2013-05-06: qty 1

## 2013-05-06 MED ORDER — AMLODIPINE BESYLATE 5 MG PO TABS
5.0000 mg | ORAL_TABLET | Freq: Every day | ORAL | Status: DC
  Administered 2013-05-07: 5 mg via ORAL
  Filled 2013-05-06: qty 1

## 2013-05-06 MED ORDER — ONDANSETRON HCL 4 MG PO TABS: 4.0000 mg | ORAL_TABLET | Freq: Four times a day (QID) | ORAL | Status: DC | PRN

## 2013-05-06 MED ORDER — ROCURONIUM BROMIDE 50 MG/5ML IV SOLN
INTRAVENOUS | Status: AC
  Filled 2013-05-06: qty 2

## 2013-05-06 MED ORDER — SODIUM CHLORIDE 0.9 % IV SOLN
INTRAVENOUS | Status: DC
  Administered 2013-05-07: 1000 mL via INTRAVENOUS

## 2013-05-06 MED ORDER — ONDANSETRON HCL 4 MG/2ML IJ SOLN: 4.0000 mg | Freq: Three times a day (TID) | INTRAMUSCULAR | Status: DC | PRN

## 2013-05-06 MED ORDER — METOPROLOL SUCCINATE ER 50 MG PO TB24
50.0000 mg | ORAL_TABLET | Freq: Every day | ORAL | Status: DC
  Administered 2013-05-07: 50 mg via ORAL
  Filled 2013-05-06: qty 1

## 2013-05-06 MED ORDER — ALBUTEROL SULFATE (5 MG/ML) 0.5% IN NEBU: 2.5000 mg | INHALATION_SOLUTION | RESPIRATORY_TRACT | Status: DC | PRN

## 2013-05-06 MED ORDER — ACETAMINOPHEN 325 MG PO TABS: 650.0000 mg | ORAL_TABLET | Freq: Four times a day (QID) | ORAL | Status: DC | PRN

## 2013-05-06 MED ORDER — HYDROCHLOROTHIAZIDE 25 MG PO TABS
25.0000 mg | ORAL_TABLET | Freq: Every day | ORAL | Status: DC
  Administered 2013-05-07: 25 mg via ORAL
  Filled 2013-05-06: qty 1

## 2013-05-06 MED ORDER — METHYLPREDNISOLONE SODIUM SUCC 125 MG IJ SOLR
INTRAMUSCULAR | Status: AC
  Filled 2013-05-06: qty 2

## 2013-05-06 MED ORDER — LIDOCAINE HCL (CARDIAC) 20 MG/ML IV SOLN
INTRAVENOUS | Status: AC
  Filled 2013-05-06: qty 5

## 2013-05-06 MED ORDER — ENOXAPARIN SODIUM 40 MG/0.4ML ~~LOC~~ SOLN
40.0000 mg | SUBCUTANEOUS | Status: DC
  Administered 2013-05-06: 40 mg via SUBCUTANEOUS
  Filled 2013-05-06 (×2): qty 0.4

## 2013-05-06 MED ORDER — INSULIN ASPART 100 UNIT/ML ~~LOC~~ SOLN: 0.0000 [IU] | Freq: Three times a day (TID) | SUBCUTANEOUS | Status: DC

## 2013-05-06 MED ORDER — DIPHENHYDRAMINE HCL 50 MG/ML IJ SOLN
25.0000 mg | Freq: Once | INTRAMUSCULAR | Status: AC
  Administered 2013-05-06: 25 mg via INTRAVENOUS
  Filled 2013-05-06: qty 1

## 2013-05-06 MED ORDER — ETOMIDATE 2 MG/ML IV SOLN
INTRAVENOUS | Status: AC
  Filled 2013-05-06: qty 20

## 2013-05-06 MED ORDER — SODIUM CHLORIDE 0.9 % IJ SOLN
3.0000 mL | Freq: Two times a day (BID) | INTRAMUSCULAR | Status: DC
  Administered 2013-05-07: 3 mL via INTRAVENOUS

## 2013-05-06 MED ORDER — METHYLPREDNISOLONE SODIUM SUCC 125 MG IJ SOLR
125.0000 mg | Freq: Once | INTRAMUSCULAR | Status: AC
  Administered 2013-05-06: 125 mg via INTRAVENOUS

## 2013-05-06 MED ORDER — SUCCINYLCHOLINE CHLORIDE 20 MG/ML IJ SOLN
INTRAMUSCULAR | Status: AC
  Filled 2013-05-06: qty 1

## 2013-05-06 MED ORDER — SIMVASTATIN 20 MG PO TABS
20.0000 mg | ORAL_TABLET | Freq: Every day | ORAL | Status: DC
  Filled 2013-05-06 (×3): qty 1

## 2013-05-06 MED ORDER — ONDANSETRON HCL 4 MG/2ML IJ SOLN: 4.0000 mg | Freq: Four times a day (QID) | INTRAMUSCULAR | Status: DC | PRN

## 2013-05-06 NOTE — ED Notes (Signed)
Pt reports worsening of symptoms ED MD made aware

## 2013-05-06 NOTE — H&P (Signed)
PATIENT DETAILS Name: Janet Williamson Age: 72 y.o. Sex: female Date of Birth: October 15, 1940 Admit Date: 05/06/2013 Janet Williamson,Janet COLLINS, MD   CHIEF COMPLAINT:  Tongue swelling-today Lower Lip swelling -yesterday  HPI: Janet Williamson is a 72 y.o. female with a Past Medical History of recent left knee replacement, HTN, DM who presents today with the above noted complaint.Per patient, yesterday, she noted mild lip swelling, which she thought would get better spontaneously. This morning she noted her tongue to be swollen as well. She had mild speech difficulty with dysarthia. She claims she is not sure when she started on Ramipril, but thinks this was within the past 2 weeks. She denies any  shortness of breath, no chest pain, no wheezing.She denies any dysphagia with medications. She was noted not to have any drooling. I was asked to admit this patient for further evaluation and treatment.   ALLERGIES:  No Known Allergies  PAST MEDICAL HISTORY: Past Medical History  Diagnosis Date  . Diabetes mellitus   . Hypertension   . Elevated cholesterol   . Cardiomyopathy in other disease   . Mitral valve disorder   . Heart murmur   . Generalized osteoarthritis   . Anemia   . Phlebitis   . Thrombophlebitis   . Stroke   . Cataracts, bilateral   . GERD (gastroesophageal reflux disease)   . Aortic stenosis     moderate AS 10/2012 echo (Dr. Anne Fu)    PAST SURGICAL HISTORY: Past Surgical History  Procedure Laterality Date  . Abdominal hysterectomy    . Eye surgery      cataract removal  . Total knee arthroplasty Right 04/14/2013    Dr August Saucer  . Total knee arthroplasty Right 04/14/2013    Procedure: TOTAL KNEE ARTHROPLASTY;  Surgeon: Cammy Copa, MD;  Location[facility] OR;  Service: Orthopedics;  Laterality: Right;    MEDICATIONS AT HOME: Prior to Admission medications   Medication Sig Start Date End Date Taking? Authorizing Provider  amLODipine (NORVASC) 5 MG tablet Take 5 mg by  mouth daily.   Yes Historical Provider, MD  Calcium Carbonate (CALCIUM 600 PO) Take 1 tablet by mouth daily.   Yes Historical Provider, MD  cephALEXin (KEFLEX) 500 MG capsule Take 500 mg by mouth 3 (three) times daily. Started 04/27/13, for 14 days, ending 05/11/13   Yes Historical Provider, MD  cholecalciferol (VITAMIN D) 1000 UNITS tablet Take 1,000 Units by mouth daily.   Yes Historical Provider, MD  clopidogrel (PLAVIX) 75 MG tablet Take 75 mg by mouth daily.   Yes Historical Provider, MD  docusate sodium 100 MG CAPS Take 100 mg by mouth 2 (two) times daily. 04/17/13  Yes Cammy Copa, MD  hydrochlorothiazide (HYDRODIURIL) 25 MG tablet Take 25 mg by mouth daily.   Yes Historical Provider, MD  metFORMIN (GLUCOPHAGE-XR) 500 MG 24 hr tablet Take 500 mg by mouth daily with breakfast.   Yes Historical Provider, MD  methocarbamol (ROBAXIN) 500 MG tablet Take 1 tablet (500 mg total) by mouth every 6 (six) hours as needed. 04/17/13  Yes Cammy Copa, MD  metoprolol succinate (TOPROL-XL) 50 MG 24 hr tablet Take 50 mg by mouth daily. Take with or immediately following a meal.   Yes Historical Provider, MD  oxyCODONE (OXY IR/ROXICODONE) 5 MG immediate release tablet Take 1-2 tablets (5-10 mg total) by mouth every 3 (three) hours as needed. 04/17/13  Yes Cammy Copa, MD  ramipril (ALTACE) 10 MG tablet Take 10 mg by mouth daily.  Yes Historical Provider, MD  rifampin (RIFADIN) 300 MG capsule Take 300 mg by mouth 2 (two) times daily. Started 04/27/13, for 10 days, ending 05/07/13.   Yes Historical Provider, MD  simvastatin (ZOCOR) 20 MG tablet Take 20 mg by mouth every evening.   Yes Historical Provider, MD    FAMILY HISTORY: No family hx of CAD  SOCIAL HISTORY:  reports that she has never smoked. She has never used smokeless tobacco. She reports that she does not drink alcohol or use illicit drugs.  REVIEW OF SYSTEMS:  Constitutional:   No  weight loss, night sweats,  Fevers, chills,  fatigue.  HEENT:    No headaches, Difficulty swallowing,Tooth/dental problems,Sore throat,  No sneezing, itching, ear ache, nasal congestion, post nasal drip,   Cardio-vascular: No chest pain,  Orthopnea, PND, swelling in lower extremities, anasarca,   dizziness, palpitations  GI:  No heartburn, indigestion, abdominal pain, nausea, vomiting, diarrhea, change in    bowel habits, loss of appetite  Resp: No shortness of breath with exertion or at rest.  No excess mucus, no productive cough, No non-productive cough,  No coughing up of blood.No change in color of mucus.No wheezing.No chest wall deformity  Skin:  no rash or lesions.  GU:  no dysuria, change in color of urine, no urgency or frequency.  No flank pain.  Musculoskeletal: No joint pain or swelling.  No decreased range of motion.  No back pain.  Psych: No change in mood or affect. No depression or anxiety.  No memory loss.   PHYSICAL EXAM: Blood pressure 134/101, pulse 97, temperature 98 F (36.7 C), temperature source Oral, resp. rate 18, SpO2 98.00%.  General appearance :Awake, alert, not in any distress. Speech slightly dysarthric. Not toxic Looking HEENT: Atraumatic and Normocephalic, pupils equally reactive to light and accomodation. Very minimal lower lip swelling, and mild tongue swelling. No stridor. Patient easily able to protrude tongue outside without major issues.Mild dysarthrua. Neck: supple, no JVD. No cervical lymphadenopathy.  Chest:Good air entry bilaterally, no added sounds  CVS: S1 S2 regular, no murmurs.  Abdomen: Bowel sounds present, Non tender and not distended with no gaurding, rigidity or rebound. Extremities: B/L Lower Ext shows no edema, both legs are warm to touch Neurology: Awake alert, and oriented X 3, CN II-XII intact, Non focal Skin:No Rash Wounds:N/A  LABS ON ADMISSION:  No results found for this basename: NA, K, CL, CO2, GLUCOSE, BUN, CREATININE, CALCIUM, MG, PHOS,  in the last 72  hours No results found for this basename: AST, ALT, ALKPHOS, BILITOT, PROT, ALBUMIN,  in the last 72 hours No results found for this basename: LIPASE, AMYLASE,  in the last 72 hours  Recent Labs  05/06/13 1726  WBC 11.0*  NEUTROABS 7.7  HGB 10.4*  HCT 32.1*  MCV 75.4*  PLT 551*   No results found for this basename: CKTOTAL, CKMB, CKMBINDEX, TROPONINI,  in the last 72 hours No results found for this basename: DDIMER,  in the last 72 hours No components found with this basename: POCBNP,    RADIOLOGIC STUDIES ON ADMISSION: No results found.   EKG: Independently reviewed. NSR  ASSESSMENT AND PLAN: Angioedema: -suspected from ACEI therapy-Bradykinin related process-not sure whether H2 Blockers and Steroids will help, patient has already received IV solumedrol and Benadryl.Monitor in SDU, currently patient feels lips swelling and tongue swelling have come down somewhat. At this time, airway does not seem to be a major issue-no stridor, tougue does appear to be significantly swollen as well.  Will hold of transfusing FFP for now, and see how she progresses  DM -hold Metformin -Start SSI  Anemia -suspect acute on chronic-mostly from perioperative blood loss from recent knee replacement -check anemia panel -monitor H/H  HTN -stop Ramipril-educated patient not to use ACEI or ARB's in the future -c/w Amlodipine and HCTZ  Dyslipidemia -c/w statins  Aortic Stenosis -outpatient monitoring  Further plan will depend as patient's clinical course evolves and further radiologic and laboratory data become available. Patient will be monitored closely.   DVT Prophylaxis: Prophylactic Lovenox   Code Status: Full Code Total time spent for admission equals 45 minutes.  [facility] Pager [phone]  If 7PM-7AM, please contact night-coverage [email or web address] Password [facility] 05/06/2013, 6:28 PM

## 2013-05-06 NOTE — ED Notes (Signed)
MD at bedside. 

## 2013-05-06 NOTE — ED Provider Notes (Signed)
I saw and evaluated the patient, reviewed the resident's note and I agree with the findings and plan.  4:15 PM 72 yo female who started an ACE-I last week now with angioedema.  Started yesterday, slowly worsening.  Has some tongue swelling, but no airway compromise at this point.  Will keep under close observation in ED.    6:56 PM Her symptoms initially seemed to be getting worse.  No distress on frequent rechecks.  Now it seems that her symptoms are starting to improve.  She will be admitted to internal medicine for close observation.    Candyce Churn, MD 05/08/13 1131

## 2013-05-06 NOTE — ED Notes (Signed)
Pt here with angioedema starting yesterday and worse today; no respiratory distress noted

## 2013-05-06 NOTE — ED Provider Notes (Signed)
CSN: [id number]     Arrival date & time 05/06/13  1537 History     First MD Initiated Contact with Patient 05/06/13 1555     Chief Complaint  Patient presents with  . Angioedema   (Consider location/radiation/quality/duration/timing/severity/associated sxs/prior Treatment) HPI Janet Williamson 72 y.o. who lives in assisted-living facility with a past medical history significant for diabetes, tension, stroke, and a heart murmur presents with swelling of her lips and tongue. This started last night and has been worsening throughout the course of today. No known exacerbating or relieving factors. She says it is localized to her lips and tongue. No dysphasia with her medications this morning. No drooling. She does note starting Ramipril one week ago for high blood pressure. She did take his medicine this morning as well. No shortness of breath, no chest pain, no wheezing.  Past Medical History  Diagnosis Date  . Diabetes mellitus   . Hypertension   . Elevated cholesterol   . Cardiomyopathy in other disease   . Mitral valve disorder   . Heart murmur   . Generalized osteoarthritis   . Anemia   . Phlebitis   . Thrombophlebitis   . Stroke   . Cataracts, bilateral   . GERD (gastroesophageal reflux disease)   . Aortic stenosis     moderate AS 10/2012 echo (Dr. Anne Fu)   Past Surgical History  Procedure Laterality Date  . Abdominal hysterectomy    . Eye surgery      cataract removal  . Total knee arthroplasty Right 04/14/2013    Dr August Saucer  . Total knee arthroplasty Right 04/14/2013    Procedure: TOTAL KNEE ARTHROPLASTY;  Surgeon: Cammy Copa, MD;  Location: [facility] OR;  Service: Orthopedics;  Laterality: Right;   History reviewed. No pertinent family history. History  Substance Use Topics  . Smoking status: Never Smoker   . Smokeless tobacco: Never Used  . Alcohol Use: No   OB History   Grav Para Term Preterm Abortions TAB SAB Ect Mult Living                 Review of Systems   Constitutional: Negative for fever, chills, diaphoresis, activity change and appetite change.  HENT: Positive for facial swelling (and tongue swelling). Negative for sore throat, rhinorrhea, sneezing, drooling and trouble swallowing.   Eyes: Negative for discharge and redness.  Respiratory: Negative for cough, chest tightness, shortness of breath, wheezing and stridor.   Cardiovascular: Negative for chest pain and leg swelling.  Gastrointestinal: Negative for nausea, vomiting, abdominal pain, diarrhea, constipation and blood in stool.  Genitourinary: Negative for difficulty urinating.  Musculoskeletal: Negative for myalgias and arthralgias.  Skin: Negative for pallor.  Neurological: Negative for dizziness, syncope, speech difficulty, weakness, light-headedness and headaches.  Hematological: Negative for adenopathy. Does not bruise/bleed easily.  Psychiatric/Behavioral: Negative for confusion and agitation.    Allergies  Review of patient's allergies indicates no known allergies.  Home Medications   No current outpatient prescriptions on file. BP 110/48  Pulse 88  Temp(Src) 99 F (37.2 C) (Oral)  Resp 22  Ht 5\' 4"  (1.626 m)  Wt 184 lb 8.4 oz (83.7 kg)  BMI 31.66 kg/m2  SpO2 100% Physical Exam  Constitutional: She is oriented to person, place, and time. She appears well-developed and well-nourished. No distress.  HENT:  Head: Normocephalic and atraumatic.  Right Ear: External ear normal.  Left Ear: External ear normal.  Upper and lower lip swelling, and tongue swelling. No appreciable drooling.  Eyes:  Conjunctivae and EOM are normal. Right eye exhibits no discharge. Left eye exhibits no discharge.  Neck: Normal range of motion. Neck supple. No JVD present.  Cardiovascular: Normal rate, regular rhythm and normal heart sounds.  Exam reveals no gallop and no friction rub.   No murmur heard. Pulmonary/Chest: Effort normal and breath sounds normal. No stridor. No respiratory  distress. She has no wheezes. She has no rales. She exhibits no tenderness.  Abdominal: Soft. Bowel sounds are normal. She exhibits no distension. There is no tenderness. There is no rebound and no guarding.  Musculoskeletal: Normal range of motion. She exhibits no edema.  Neurological: She is alert and oriented to person, place, and time.  Skin: Skin is warm. No rash noted. She is not diaphoretic.  Psychiatric: She has a normal mood and affect. Her behavior is normal.    ED Course   Procedures (including critical care time)  Labs Reviewed  CBC WITH DIFFERENTIAL - Abnormal; Notable for the following:    WBC 11.0 (*)    Hemoglobin 10.4 (*)    HCT 32.1 (*)    MCV 75.4 (*)    MCH 24.4 (*)    RDW 16.6 (*)    Platelets 551 (*)    All other components within normal limits  BASIC METABOLIC PANEL - Abnormal; Notable for the following:    Sodium 134 (*)    Glucose, Bld 114 (*)    GFR calc non Af Amer 86 (*)    All other components within normal limits  CBC - Abnormal; Notable for the following:    WBC 12.3 (*)    Hemoglobin 10.0 (*)    HCT 30.5 (*)    MCV 74.4 (*)    MCH 24.4 (*)    RDW 16.4 (*)    Platelets 498 (*)    All other components within normal limits  CREATININE, SERUM - Abnormal; Notable for the following:    GFR calc non Af Amer 90 (*)    All other components within normal limits  GLUCOSE, CAPILLARY - Abnormal; Notable for the following:    Glucose-Capillary 139 (*)    All other components within normal limits  MRSA PCR SCREENING  RETICULOCYTES  CBC  COMPREHENSIVE METABOLIC PANEL  VITAMIN B12  FOLATE  IRON AND TIBC  FERRITIN   No results found. No diagnosis found.   Date: 05/06/2013  Rate: 95  Rhythm: normal sinus rhythm  QRS Axis: normal  Intervals: normal  ST/T Wave abnormalities: nonspecific T wave changes  Conduction Disutrbances:none  Narrative Interpretation: NSR  Old EKG Reviewed: unchanged   MDM  Janet Williamson 72 y.o. presents for  angioedema. Patient started ACE inhibitor one week ago. Symptoms started yesterday and have been worsening. She is afebrile vital signs are stable. Oxygen saturation 99% on room air. No stridor or audible breath sounds. No drooling. No tracheal breath sounds. Patient was given steroids and antihistamines in the event that this was an allergic reaction.   12:27 AM swelling appeared to be worsening. ENT consulted via telephone, who indicated admission to the step down unit indicated.  Hospitalist consulted and will admit the patient.  Labs reviewed. I discussed this patient's care at my attending, Dr.Wofford.     Sena Hitch, MD 05/07/13 [phone]

## 2013-05-07 DIAGNOSIS — I359 Nonrheumatic aortic valve disorder, unspecified: Secondary | ICD-10-CM

## 2013-05-07 DIAGNOSIS — E119 Type 2 diabetes mellitus without complications: Secondary | ICD-10-CM

## 2013-05-07 LAB — GLUCOSE, CAPILLARY: Glucose-Capillary: 85 mg/dL (ref 70–99)

## 2013-05-07 LAB — COMPREHENSIVE METABOLIC PANEL
BUN: 14 mg/dL (ref 6–23)
Calcium: 9.8 mg/dL (ref 8.4–10.5)
Creatinine, Ser: 0.64 mg/dL (ref 0.50–1.10)
GFR calc Af Amer: >90 mL/min (ref >90)
Glucose, Bld: 93 mg/dL (ref 70–99)
Sodium: 137 mEq/L (ref 135–145)
Total Protein: 7 g/dL (ref 6.0–8.3)

## 2013-05-07 LAB — FOLATE: Folate: 12.3 ng/mL

## 2013-05-07 LAB — IRON AND TIBC
Iron: 17 ug/dL — ABNORMAL LOW (ref 42–135)
TIBC: 244 ug/dL — ABNORMAL LOW (ref 250–470)

## 2013-05-07 LAB — CBC
HCT: 27.7 % — ABNORMAL LOW (ref 36.0–46.0)
Hemoglobin: 8.9 g/dL — ABNORMAL LOW (ref 12.0–15.0)
MCH: 24 pg — ABNORMAL LOW (ref 26.0–34.0)
MCHC: 32.1 g/dL (ref 30.0–36.0)
MCV: 74.7 fL — ABNORMAL LOW (ref 78.0–100.0)

## 2013-05-07 LAB — VITAMIN B12: Vitamin B-12: 413 pg/mL (ref 211–911)

## 2013-05-07 NOTE — Progress Notes (Signed)
Utilization Review Completed.  

## 2013-05-07 NOTE — Progress Notes (Signed)
Nutrition Brief Note  Patient identified on the Malnutrition Screening Tool (MST) Report for recent weight loss without trying and eating poorly because of a decreased appetite.  Per readings below, patient has lost weight since June 2014; desirable given obesity.  Wt Readings from Last 10 Encounters:  05/06/13 184 lb 8.4 oz (83.7 kg)  04/03/13 194 lb 4.8 oz (88.134 kg)    Body mass index is 31.66 kg/(m^2). Patient meets criteria for Obesity Class I based on current BMI.   Current diet order is Heart Healthy (advanced 1119).  Patient NPO at time of RD visit; stated she was hungry.  Labs and medications reviewed.   No nutrition interventions warranted at this time. If nutrition issues arise, please consult RD.   Maureen Chatters, RD, LDN Pager #: [phone] After-Hours Pager #: [phone]

## 2013-05-07 NOTE — Discharge Summary (Signed)
Physician Discharge Summary  Janet Williamson AVW:[id number] DOB: 1941-03-05 DOA: 05/06/2013  PCP: Astrid Divine, MD  Admit date: 05/06/2013 Discharge date: 05/07/2013  Time spent: 45 minutes  Recommendations for Outpatient Follow-up:  1. Return to PCP in 2-3 days for BP check and further treatment of HTN since we have stopped Lisinopril  Discharge Diagnoses:  Principal Problem:   Angioedema Active Problems:   DM (diabetes mellitus)   HTN (hypertension)   Anemia   Aortic stenosis   Discharge Condition: stable  Diet recommendation: heart healthy, diabetic diet  Filed Weights   05/06/13 2000  Weight: 83.7 kg (184 lb 8.4 oz)    History of present illness:  Janet Williamson is a 72 y.o. female with a Past Medical History of recent left knee replacement, HTN, DM who presents today with the above noted complaint.Per patient, yesterday, she noted mild lip swelling, which she thought would get better spontaneously. This morning she noted her tongue to be swollen as well. She had mild speech difficulty with dysarthia. She claims she is not sure when she started on Ramipril, but thinks this was within the past 2 weeks. She denies any shortness of breath, no chest pain, no wheezing.She denies any dysphagia with medications. She was noted not to have any drooling.  I was asked to admit this patient for further evaluation and treatment.   Hospital Course:  Angioedema - due to Lisinopril- Pt given Solumedrol x 1 and Benadryl x 1 - all symptoms resolved -  tolerating a diet and ready for d/c  All other medical problems remained stable  Discharge Exam: Filed Vitals:   05/07/13 0713 05/07/13 0840 05/07/13 0943 05/07/13 1233  BP:  116/58 117/54 145/56  Pulse: 69 73 73 74  Temp:  98.2 F (36.8 C)  98.5 F (36.9 C)  TempSrc:  Oral  Oral  Resp: 12 16  17   Height:      Weight:      SpO2: 100% 99%  99%    General: AAO x 3 Cardiovascular: RRR, no murmurs Respiratory: CTA b/l    Discharge Instructions      Discharge Orders   Future Appointments Provider Department Dept Phone   06/09/2013 9:30 AM [facility] [place] [facility] [phone]   06/11/2013 9:15 AM Reather Littler, MD [place] [facility] [phone]   Future Orders Complete By Expires     Diet - low sodium heart healthy  As directed     Increase activity slowly  As directed         Medication List    STOP taking these medications       cephALEXin 500 MG capsule  Commonly known as:  KEFLEX     ramipril 10 MG tablet  Commonly known as:  ALTACE     rifampin 300 MG capsule  Commonly known as:  RIFADIN      TAKE these medications       amLODipine 5 MG tablet  Commonly known as:  NORVASC  Take 5 mg by mouth daily.     CALCIUM 600 PO  Take 1 tablet by mouth daily.     cholecalciferol 1000 UNITS tablet  Commonly known as:  VITAMIN D  Take 1,000 Units by mouth daily.     clopidogrel 75 MG tablet  Commonly known as:  PLAVIX  Take 75 mg by mouth daily.     DSS 100 MG Caps  Take 100 mg by mouth 2 (two) times daily.  hydrochlorothiazide 25 MG tablet  Commonly known as:  HYDRODIURIL  Take 25 mg by mouth daily.     metFORMIN 500 MG 24 hr tablet  Commonly known as:  GLUCOPHAGE-XR  Take 500 mg by mouth daily with breakfast.     methocarbamol 500 MG tablet  Commonly known as:  ROBAXIN  Take 1 tablet (500 mg total) by mouth every 6 (six) hours as needed.     metoprolol succinate 50 MG 24 hr tablet  Commonly known as:  TOPROL-XL  Take 50 mg by mouth daily. Take with or immediately following a meal.     oxyCODONE 5 MG immediate release tablet  Commonly known as:  Oxy IR/ROXICODONE  Take 1-2 tablets (5-10 mg total) by mouth every 3 (three) hours as needed.     simvastatin 20 MG tablet  Commonly known as:  ZOCOR  Take 20 mg by mouth every evening.       No Known Allergies    The results of significant diagnostics from this hospitalization  (including imaging, microbiology, ancillary and laboratory) are listed below for reference.    Significant Diagnostic Studies: No results found.  Microbiology: Recent Results (from the past 240 hour(s))  MRSA PCR SCREENING     Status: None   Collection Time    05/06/13  8:02 PM      Result Value Range Status   MRSA by PCR NEGATIVE  NEGATIVE Final   Comment:            The GeneXpert MRSA Assay (FDA     approved for NASAL specimens     only), is one component of a     comprehensive MRSA colonization     surveillance program. It is not     intended to diagnose MRSA     infection nor to guide or     monitor treatment for     MRSA infections.     Labs: Basic Metabolic Panel:  Recent Labs Lab 05/06/13 1726 05/06/13 2033 05/07/13 0655  NA 134*  --  137  K 3.7  --  3.6  CL 96  --  101  CO2 28  --  28  GLUCOSE 114*  --  93  BUN 16  --  14  CREATININE 0.67 0.58 0.64  CALCIUM 10.3  --  9.8   Liver Function Tests:  Recent Labs Lab 05/07/13 0655  AST 16  ALT 15  ALKPHOS 72  BILITOT 0.3  PROT 7.0  ALBUMIN 2.7*   No results found for this basename: LIPASE, AMYLASE,  in the last 168 hours No results found for this basename: AMMONIA,  in the last 168 hours CBC:  Recent Labs Lab 05/06/13 1726 05/06/13 2033 05/07/13 0655  WBC 11.0* 12.3* 9.4  NEUTROABS 7.7  --   --   HGB 10.4* 10.0* 8.9*  HCT 32.1* 30.5* 27.7*  MCV 75.4* 74.4* 74.7*  PLT 551* 498* 427*   Cardiac Enzymes: No results found for this basename: CKTOTAL, CKMB, CKMBINDEX, TROPONINI,  in the last 168 hours BNP: BNP (last 3 results) No results found for this basename: PROBNP,  in the last 8760 hours CBG:  Recent Labs Lab 05/06/13 2158 05/07/13 0803 05/07/13 1220  GLUCAP 139* 84 85       Signed:  Awa Bachicha  [facility] 05/07/2013, 4:12 PM

## 2013-05-07 NOTE — Progress Notes (Signed)
Pt d/c home via w/c.  IV and tele d/c'd.  D/c instructions given to pt, verbalizes understanding.  Salomon Mast, RN

## 2013-05-08 NOTE — ED Provider Notes (Signed)
I saw and evaluated the patient, reviewed the resident's note and I agree with the findings and plan. See my separate note.  Candyce Churn, MD 05/08/13 [phone]

## 2013-05-13 ENCOUNTER — Other Ambulatory Visit: Payer: Self-pay

## 2013-06-01 ENCOUNTER — Other Ambulatory Visit: Payer: Self-pay | Admitting: *Deleted

## 2013-06-01 DIAGNOSIS — E119 Type 2 diabetes mellitus without complications: Secondary | ICD-10-CM

## 2013-06-09 ENCOUNTER — Other Ambulatory Visit (INDEPENDENT_AMBULATORY_CARE_PROVIDER_SITE_OTHER): Payer: Medicare Other

## 2013-06-09 DIAGNOSIS — E119 Type 2 diabetes mellitus without complications: Secondary | ICD-10-CM

## 2013-06-09 LAB — URINALYSIS, ROUTINE W REFLEX MICROSCOPIC
Bilirubin Urine: NEGATIVE
Hgb urine dipstick: NEGATIVE
Nitrite: NEGATIVE
pH: 6 (ref 5.0–8.0)

## 2013-06-09 LAB — COMPREHENSIVE METABOLIC PANEL
AST: 13 U/L (ref 0–37)
Albumin: 3.4 g/dL — ABNORMAL LOW (ref 3.5–5.2)
BUN: 13 mg/dL (ref 6–23)
CO2: 29 mEq/L (ref 19–32)
Calcium: 9.7 mg/dL (ref 8.4–10.5)
Chloride: 102 mEq/L (ref 96–112)
GFR: 109.23 mL/min (ref >60.00)
Potassium: 3.1 mEq/L — ABNORMAL LOW (ref 3.5–5.1)

## 2013-06-09 LAB — MICROALBUMIN / CREATININE URINE RATIO: Creatinine,U: 213.7 mg/dL

## 2013-06-09 LAB — LIPID PANEL
HDL: 44.9 mg/dL (ref >39.00)
Total CHOL/HDL Ratio: 3
VLDL: 15.2 mg/dL (ref 0.0–40.0)

## 2013-06-10 ENCOUNTER — Other Ambulatory Visit (HOSPITAL_COMMUNITY): Payer: Self-pay | Admitting: Orthopedic Surgery

## 2013-06-10 ENCOUNTER — Ambulatory Visit (HOSPITAL_COMMUNITY)
Admission: RE | Admit: 2013-06-10 | Discharge: 2013-06-10 | Disposition: A | Discharge status: Home / Self Care | Payer: PRIVATE HEALTH INSURANCE | Source: Ambulatory Visit | Attending: Orthopedic Surgery | Admitting: Orthopedic Surgery

## 2013-06-10 DIAGNOSIS — M79609 Pain in unspecified limb: Secondary | ICD-10-CM | POA: Insufficient documentation

## 2013-06-10 DIAGNOSIS — M25561 Pain in right knee: Secondary | ICD-10-CM

## 2013-06-10 DIAGNOSIS — Z86718 Personal history of other venous thrombosis and embolism: Secondary | ICD-10-CM | POA: Insufficient documentation

## 2013-06-10 DIAGNOSIS — M25669 Stiffness of unspecified knee, not elsewhere classified: Secondary | ICD-10-CM | POA: Insufficient documentation

## 2013-06-10 DIAGNOSIS — M7989 Other specified soft tissue disorders: Secondary | ICD-10-CM

## 2013-06-10 DIAGNOSIS — Z96659 Presence of unspecified artificial knee joint: Secondary | ICD-10-CM | POA: Insufficient documentation

## 2013-06-10 NOTE — Progress Notes (Signed)
VASCULAR LAB PRELIMINARY  PRELIMINARY  PRELIMINARY  PRELIMINARY  Right lower extremity venous duplex  completed.    Preliminary report:  Right leg negative for deep and superficial vein thrombosis.  Baker's cyst noted in right popliteal fossa. Report called to Tiffany.  Siyana Erney, RVT 06/10/2013, 5:52 PM

## 2013-06-11 ENCOUNTER — Ambulatory Visit: Payer: Medicare Other | Admitting: Endocrinology

## 2013-06-18 ENCOUNTER — Encounter: Payer: Self-pay | Admitting: Endocrinology

## 2013-06-18 ENCOUNTER — Ambulatory Visit (INDEPENDENT_AMBULATORY_CARE_PROVIDER_SITE_OTHER): Payer: Medicare Other | Admitting: Endocrinology

## 2013-06-18 VITALS — BP 112/60 | HR 77 | Temp 98.1°F | Ht 63.5 in | Wt 182.4 lb

## 2013-06-18 DIAGNOSIS — E119 Type 2 diabetes mellitus without complications: Secondary | ICD-10-CM

## 2013-06-18 DIAGNOSIS — E876 Hypokalemia: Secondary | ICD-10-CM

## 2013-06-18 MED ORDER — POTASSIUM CHLORIDE CRYS ER 20 MEQ PO TBCR: 20.0000 meq | EXTENDED_RELEASE_TABLET | Freq: Every day | ORAL | Status: DC

## 2013-06-18 NOTE — Patient Instructions (Addendum)
Please check blood sugars at least half the time about 2 hours after any meal and as directed on waking up. Please bring blood sugar monitor to each visit  Reduce HCTZ to 1/2 daily  Start potassium Rx

## 2013-06-18 NOTE — Progress Notes (Signed)
Patient ID: Janet Williamson, female   DOB: [birth year], 72 y.o.   MRN: [id number]  Lilith Rosner is an 72 y.o. female.   Reason for Appointment: Diabetes follow-up   History of Present Illness   Diagnosis: Type 2 DIABETES MELITUS     She has had mild diabetes which has generally been well controlled with metformin alone. Has had no side effects from metformin is taking 1000 mg a day She also has had diabetes education and is checking her blood sugars periodically at home  Oral hypoglycemic drugs: Metformin alone        Side effects from medications: None Monitors blood glucose: Once a day.    Glucometer: One Touch.          Blood Glucose readings from meter review: readings pc breakfast: 167 157 pcl acs 82, pcl 164  Hypoglycemia frequency: Never.          Meals: 3 meals per day.  portions are smaller recently because of decreased appetite        Physical activity: exercise: walks 15 minutes, has gradually increased activity since her knee surgery in July            Her previous records are not available for baseline A1c No history of diabetic complications  Wt Readings from Last 3 Encounters:  06/18/13 182 lb 6.4 oz (82.736 kg)  05/06/13 184 lb 8.4 oz (83.7 kg)  04/03/13 194 lb 4.8 oz (88.134 kg)   Lab Results  Component Value Date   MICROALBUR 0.6 06/09/2013     No visits with results within 1 Week(s) from this visit. Latest known visit with results is:  Appointment on 06/09/2013  Component Date Value Range Status  . Hemoglobin A1C 06/09/2013 6.2  4.6 - 6.5 % Final   Glycemic Control Guidelines for People with Diabetes:Non Diabetic:  <6%Goal of Therapy: <7%Additional Action Suggested:  >8%   . Sodium 06/09/2013 135  135 - 145 mEq/L Final  . Potassium 06/09/2013 3.1* 3.5 - 5.1 mEq/L Final  . Chloride 06/09/2013 102  96 - 112 mEq/L Final  . CO2 06/09/2013 29  19 - 32 mEq/L Final  . Glucose, Bld 06/09/2013 112* 70 - 99 mg/dL Final  . BUN 78/29/5621 13  6 - 23 mg/dL Final  .  Creatinine, Ser 06/09/2013 0.7  0.4 - 1.2 mg/dL Final  . Total Bilirubin 06/09/2013 0.3  0.3 - 1.2 mg/dL Final  . Alkaline Phosphatase 06/09/2013 51  39 - 117 U/L Final  . AST 06/09/2013 13  0 - 37 U/L Final  . ALT 06/09/2013 10  0 - 35 U/L Final  . Total Protein 06/09/2013 7.8  6.0 - 8.3 g/dL Final  . Albumin 30/86/5784 3.4* 3.5 - 5.2 g/dL Final  . Calcium 69/62/9528 9.7  8.4 - 10.5 mg/dL Final  . GFR 41/32/4401 109.23  >60.00 mL/min Final  . Microalb, Ur 06/09/2013 0.6  0.0 - 1.9 mg/dL Final  . Creatinine,U 02/72/5366 213.7   Final  . Microalb Creat Ratio 06/09/2013 0.3  0.0 - 30.0 mg/g Final  . Cholesterol 06/09/2013 147  0 - 200 mg/dL Final   ATP III Classification       Desirable:  < 200 mg/dL               Borderline High:  200 - 239 mg/dL          High:  > = 440 mg/dL  . Triglycerides 06/09/2013 76.0  0.0 - 149.0 mg/dL Final  Normal:  <150 mg/dLBorderline High:  150 - 199 mg/dL  . HDL 06/09/2013 44.90  >39.00 mg/dL Final  . VLDL 16/07/9603 15.2  0.0 - 40.0 mg/dL Final  . LDL Cholesterol 06/09/2013 87  0 - 99 mg/dL Final  . Total CHOL/HDL Ratio 06/09/2013 3   Final                  Men          Women1/2 Average Risk     3.4          3.3Average Risk          5.0          4.42X Average Risk          9.6          7.13X Average Risk          15.0          11.0                      . Color, Urine 06/09/2013 LT. YELLOW  Yellow;Lt. Yellow Final  . APPearance 06/09/2013 Cloudy  Clear Final  . Specific Gravity, Urine 06/09/2013 1.025  1.000-1.030 Final  . pH 06/09/2013 6.0  5.0 - 8.0 Final  . Total Protein, Urine 06/09/2013 NEGATIVE  Negative Final  . Urine Glucose 06/09/2013 NEGATIVE  Negative Final  . Ketones, ur 06/09/2013 NEGATIVE  Negative Final  . Bilirubin Urine 06/09/2013 NEGATIVE  Negative Final  . Hgb urine dipstick 06/09/2013 NEGATIVE  Negative Final  . Urobilinogen, UA 06/09/2013 0.2  0.0 - 1.0 Final  . Leukocytes, UA 06/09/2013 SMALL  Negative Final  . Nitrite 06/09/2013  NEGATIVE  Negative Final  . WBC, UA 06/09/2013 11-20/hpf  0-2/hpf Final  . RBC / HPF 06/09/2013 none seen  0-2/hpf Final  . Squamous Epithelial / LPF 06/09/2013 Many(>10/hpf)  Rare(0-4/hpf) Final  . Bacteria, UA 06/09/2013 Many(>50/hpf)  None Final      Medication List       This list is accurate as of: 06/18/13 10:13 AM.  Always use your most recent med list.               amLODipine 5 MG tablet  Commonly known as:  NORVASC  Take 5 mg by mouth daily.     aspirin 81 MG tablet  Take 81 mg by mouth daily.     CALCIUM 600 PO  Take 1 tablet by mouth daily.     cholecalciferol 1000 UNITS tablet  Commonly known as:  VITAMIN D  Take 1,000 Units by mouth daily.     clopidogrel 75 MG tablet  Commonly known as:  PLAVIX  Take 75 mg by mouth daily.     DSS 100 MG Caps  Take 100 mg by mouth 2 (two) times daily.     hydrochlorothiazide 25 MG tablet  Commonly known as:  HYDRODIURIL  Take 25 mg by mouth daily.     metFORMIN 500 MG 24 hr tablet  Commonly known as:  GLUCOPHAGE-XR  Take 500 mg by mouth daily with breakfast.     methocarbamol 500 MG tablet  Commonly known as:  ROBAXIN  Take 1 tablet (500 mg total) by mouth every 6 (six) hours as needed.     metoprolol succinate 50 MG 24 hr tablet  Commonly known as:  TOPROL-XL  Take 50 mg by mouth daily. Take with or immediately following a meal.     oxyCODONE 5  MG immediate release tablet  Commonly known as:  Oxy IR/ROXICODONE  Take 1-2 tablets (5-10 mg total) by mouth every 3 (three) hours as needed.     simvastatin 20 MG tablet  Commonly known as:  ZOCOR  Take 20 mg by mouth every evening.        Allergies: No Known Allergies  Past Medical History  Diagnosis Date  . Diabetes mellitus   . Hypertension   . Elevated cholesterol   . Cardiomyopathy in other disease   . Mitral valve disorder   . Heart murmur   . Generalized osteoarthritis   . Anemia   . Phlebitis   . Thrombophlebitis   . Stroke   . Cataracts,  bilateral   . GERD (gastroesophageal reflux disease)   . Aortic stenosis     moderate AS 10/2012 echo (Dr. Anne Fu)    Past Surgical History  Procedure Laterality Date  . Abdominal hysterectomy    . Eye surgery      cataract removal  . Total knee arthroplasty Right 04/14/2013    Dr August Saucer  . Total knee arthroplasty Right 04/14/2013    Procedure: TOTAL KNEE ARTHROPLASTY;  Surgeon: Cammy Copa, MD;  Location: [facility] OR;  Service: Orthopedics;  Laterality: Right;    History reviewed. No pertinent family history.  Social History:  reports that she has never smoked. She has never used smokeless tobacco. She reports that she does not drink alcohol or use illicit drugs.  Review of Systems:  HYPERTENSION:  she has been managed with a 3 drug regimen, followed by PCP. No lightheadedness but her blood pressure is relatively low today She does not have a followup appointment with PCP in the next few days No pedal edema  HYPERLIPIDEMIA: The lipid abnormality consists of elevated LDL treated with simvastatin and LDL is 87.  Has history of anemia     Examination:   BP 112/60  Pulse 77  Temp(Src) 98.1 F (36.7 C) (Oral)  Ht 5' 3.5" (1.613 m)  Wt 182 lb 6.4 oz (82.736 kg)  BMI 31.8 kg/m2  SpO2 95%  Body mass index is 31.8 kg/(m^2).   ASSESSMENT/ PLAN::   Diabetes type 2   The patient's diabetes control appears to be excellent with A1c 6.2 and mostly good readings at home. Postprandial readings are upper normal but adequate for her overall medical conditions and age She will continue metformin and hopefully will be able to exercise more Discussed checking blood sugars at various times to help adjust her medication, including some fasting readings Today discussed balanced meals within a protein at each meal   HYPOKALEMIA: She has a potassium of 3.1, likely to be from HCTZ, since her blood pressure is low normal she can reduce the dose to half a tablet and followup with PCP May need  potassium supplementation if potassium persistently low, also advised her on dietary supplements   Hyperlipidemia: Well controlled with simvastatin and tolerating this well  Kindsey Eblin 06/18/2013, 10:13 AM

## 2013-07-06 ENCOUNTER — Other Ambulatory Visit (HOSPITAL_COMMUNITY): Payer: Self-pay | Admitting: Orthopedic Surgery

## 2013-07-06 DIAGNOSIS — I82409 Acute embolism and thrombosis of unspecified deep veins of unspecified lower extremity: Secondary | ICD-10-CM

## 2013-07-09 ENCOUNTER — Other Ambulatory Visit: Payer: Self-pay | Admitting: Radiology

## 2013-07-10 ENCOUNTER — Encounter (HOSPITAL_COMMUNITY): Payer: Self-pay | Admitting: Pharmacy Technician

## 2013-07-14 ENCOUNTER — Ambulatory Visit (HOSPITAL_COMMUNITY)
Admission: RE | Admit: 2013-07-14 | Discharge: 2013-07-14 | Disposition: A | Discharge status: Home / Self Care | Payer: Medicare Other | Source: Ambulatory Visit | Attending: Orthopedic Surgery | Admitting: Orthopedic Surgery

## 2013-07-14 ENCOUNTER — Encounter (HOSPITAL_COMMUNITY): Payer: Self-pay

## 2013-07-14 DIAGNOSIS — E119 Type 2 diabetes mellitus without complications: Secondary | ICD-10-CM | POA: Diagnosis not present

## 2013-07-14 DIAGNOSIS — E785 Hyperlipidemia, unspecified: Secondary | ICD-10-CM | POA: Insufficient documentation

## 2013-07-14 DIAGNOSIS — Z4689 Encounter for fitting and adjustment of other specified devices: Secondary | ICD-10-CM | POA: Insufficient documentation

## 2013-07-14 DIAGNOSIS — I10 Essential (primary) hypertension: Secondary | ICD-10-CM | POA: Insufficient documentation

## 2013-07-14 DIAGNOSIS — I82409 Acute embolism and thrombosis of unspecified deep veins of unspecified lower extremity: Secondary | ICD-10-CM

## 2013-07-14 DIAGNOSIS — Z8673 Personal history of transient ischemic attack (TIA), and cerebral infarction without residual deficits: Secondary | ICD-10-CM | POA: Diagnosis not present

## 2013-07-14 DIAGNOSIS — Z86718 Personal history of other venous thrombosis and embolism: Secondary | ICD-10-CM | POA: Diagnosis not present

## 2013-07-14 DIAGNOSIS — I359 Nonrheumatic aortic valve disorder, unspecified: Secondary | ICD-10-CM | POA: Diagnosis not present

## 2013-07-14 LAB — BASIC METABOLIC PANEL
BUN: 12 mg/dL (ref 6–23)
CO2: 28 mEq/L (ref 19–32)
Chloride: 100 mEq/L (ref 96–112)
Creatinine, Ser: 0.66 mg/dL (ref 0.50–1.10)
GFR calc non Af Amer: 86 mL/min — ABNORMAL LOW (ref >90)
Glucose, Bld: 110 mg/dL — ABNORMAL HIGH (ref 70–99)
Potassium: 3.6 mEq/L (ref 3.5–5.1)

## 2013-07-14 LAB — PROTIME-INR
INR: 1.1 (ref 0.00–1.49)
Prothrombin Time: 14 seconds (ref 11.6–15.2)

## 2013-07-14 LAB — CBC
HCT: 32.1 % — ABNORMAL LOW (ref 36.0–46.0)
Hemoglobin: 10 g/dL — ABNORMAL LOW (ref 12.0–15.0)
MCH: 22.4 pg — ABNORMAL LOW (ref 26.0–34.0)
MCHC: 31.2 g/dL (ref 30.0–36.0)
MCV: 71.8 fL — ABNORMAL LOW (ref 78.0–100.0)
Platelets: 357 10*3/uL (ref 150–400)
RDW: 17.4 % — ABNORMAL HIGH (ref 11.5–15.5)

## 2013-07-14 MED ORDER — FENTANYL CITRATE 0.05 MG/ML IJ SOLN
INTRAMUSCULAR | Status: AC
  Filled 2013-07-14: qty 4

## 2013-07-14 MED ORDER — MIDAZOLAM HCL 2 MG/2ML IJ SOLN
INTRAMUSCULAR | Status: AC | PRN
  Administered 2013-07-14 (×2): 1 mg via INTRAVENOUS

## 2013-07-14 MED ORDER — SODIUM CHLORIDE 0.9 % IV SOLN: Freq: Once | INTRAVENOUS | Status: DC

## 2013-07-14 MED ORDER — IOHEXOL 300 MG/ML  SOLN
100.0000 mL | Freq: Once | INTRAMUSCULAR | Status: AC | PRN
  Administered 2013-07-14: 30 mL via INTRAVENOUS

## 2013-07-14 MED ORDER — FENTANYL CITRATE 0.05 MG/ML IJ SOLN
INTRAMUSCULAR | Status: AC | PRN
  Administered 2013-07-14 (×4): 25 ug via INTRAVENOUS

## 2013-07-14 MED ORDER — MIDAZOLAM HCL 2 MG/2ML IJ SOLN
INTRAMUSCULAR | Status: AC
  Filled 2013-07-14: qty 4

## 2013-07-14 NOTE — H&P (Signed)
Agree 

## 2013-07-14 NOTE — H&P (Signed)
Janet Williamson is an 72 y.o. female.   Chief Complaint: Inferior vena cava filter placed 04/02/2013 Pt has had hx of RLE DVT 2008 Had Rt knee surgery 04/14/13. Due to prolonged recovery and No planned use of anticoagulation- IVC filter was placed prophylactically. [place] of lower extremities performed 06/10/13 neg for thrombus. Pt scheduled now for IVC filter retrieval HPI: DM; HTN; HLD; Ao stenosis; CVA  Past Medical History  Diagnosis Date  . Diabetes mellitus   . Hypertension   . Elevated cholesterol   . Cardiomyopathy in other disease   . Mitral valve disorder   . Heart murmur   . Generalized osteoarthritis   . Anemia   . Phlebitis   . Thrombophlebitis   . Stroke   . Cataracts, bilateral   . GERD (gastroesophageal reflux disease)   . Aortic stenosis     moderate AS 10/2012 echo (Dr. Anne Fu)    Past Surgical History  Procedure Laterality Date  . Abdominal hysterectomy    . Eye surgery      cataract removal  . Total knee arthroplasty Right 04/14/2013    Dr August Saucer  . Total knee arthroplasty Right 04/14/2013    Procedure: TOTAL KNEE ARTHROPLASTY;  Surgeon: Cammy Copa, MD;  Location: [facility] OR;  Service: Orthopedics;  Laterality: Right;    No family history on file. Social History:  reports that she has never smoked. She has never used smokeless tobacco. She reports that she does not drink alcohol or use illicit drugs.  Allergies:  Allergies  Allergen Reactions  . Ramipril Swelling     (Not in a hospital admission)  Results for orders placed during the hospital encounter of 07/14/13 (from the past 48 hour(s))  GLUCOSE, CAPILLARY     Status: Abnormal   Collection Time    07/14/13  9:10 AM      Result Value Range   Glucose-Capillary 111 (*) 70 - 99 mg/dL   No results found.  Review of Systems  Constitutional: Negative for fever and weight loss.  Respiratory: Negative for shortness of breath.   Cardiovascular: Negative for chest pain.  Gastrointestinal: Negative for  nausea, vomiting and abdominal pain.  Neurological: Negative for weakness and headaches.    Blood pressure 137/67, pulse 70, temperature 98.1 F (36.7 C), temperature source Oral, resp. rate 18, height 5\' 4"  (1.626 m), weight 182 lb (82.555 kg), SpO2 98.00%. Physical Exam  Constitutional: She is oriented to person, place, and time.  Cardiovascular: Normal rate and regular rhythm.   Murmur heard. Respiratory: Effort normal and breath sounds normal. She has no wheezes.  GI: Soft. Bowel sounds are normal. There is no tenderness.  Musculoskeletal: Normal range of motion.  Neurological: She is alert and oriented to person, place, and time. Coordination normal.  Skin: Skin is warm and dry.  Psychiatric: She has a normal mood and affect. Her behavior is normal. Judgment and thought content normal.     Assessment/Plan Pt scheduled for IVC filter removal Knee surgery 7/14; neg venous US B low extr 9/14- No longer needed per Dr August Saucer Pt aware of procedure benefits and risks and agreeable to proceed Consent signed and in chart  Cole Eastridge A 07/14/2013, 9:54 AM

## 2013-07-14 NOTE — Procedures (Signed)
Procedure:  IVC venography and filter removal Findings:  IVC normally patent.  [place] filter removed without difficulty.

## 2013-07-14 NOTE — ED Notes (Signed)
dsg to R neck, clean, dry, intact

## 2013-08-06 ENCOUNTER — Encounter: Payer: Self-pay | Admitting: Cardiology

## 2013-08-10 ENCOUNTER — Encounter: Payer: Self-pay | Admitting: Cardiology

## 2013-08-13 ENCOUNTER — Other Ambulatory Visit: Payer: Self-pay

## 2013-09-17 ENCOUNTER — Other Ambulatory Visit (HOSPITAL_COMMUNITY): Payer: Self-pay | Admitting: Family Medicine

## 2013-09-17 DIAGNOSIS — Z1231 Encounter for screening mammogram for malignant neoplasm of breast: Secondary | ICD-10-CM

## 2013-10-05 ENCOUNTER — Encounter: Payer: Self-pay | Admitting: Cardiology

## 2013-10-05 DIAGNOSIS — I43 Cardiomyopathy in diseases classified elsewhere: Secondary | ICD-10-CM | POA: Insufficient documentation

## 2013-10-05 DIAGNOSIS — E7800 Pure hypercholesterolemia, unspecified: Secondary | ICD-10-CM | POA: Insufficient documentation

## 2013-10-05 DIAGNOSIS — I059 Rheumatic mitral valve disease, unspecified: Secondary | ICD-10-CM | POA: Insufficient documentation

## 2013-10-05 DIAGNOSIS — R011 Cardiac murmur, unspecified: Secondary | ICD-10-CM | POA: Insufficient documentation

## 2013-10-05 DIAGNOSIS — I10 Essential (primary) hypertension: Secondary | ICD-10-CM | POA: Insufficient documentation

## 2013-10-09 ENCOUNTER — Ambulatory Visit (HOSPITAL_COMMUNITY)
Admission: RE | Admit: 2013-10-09 | Discharge: 2013-10-09 | Disposition: A | Discharge status: Home / Self Care | Payer: Medicare PPO | Source: Ambulatory Visit | Attending: Family Medicine | Admitting: Family Medicine

## 2013-10-09 DIAGNOSIS — Z1231 Encounter for screening mammogram for malignant neoplasm of breast: Secondary | ICD-10-CM | POA: Insufficient documentation

## 2013-10-14 ENCOUNTER — Ambulatory Visit: Payer: Medicare Other | Admitting: Cardiology

## 2013-10-15 ENCOUNTER — Other Ambulatory Visit (INDEPENDENT_AMBULATORY_CARE_PROVIDER_SITE_OTHER): Payer: Commercial Managed Care - HMO

## 2013-10-15 ENCOUNTER — Other Ambulatory Visit: Payer: Self-pay | Admitting: Family Medicine

## 2013-10-15 DIAGNOSIS — R928 Other abnormal and inconclusive findings on diagnostic imaging of breast: Secondary | ICD-10-CM

## 2013-10-15 DIAGNOSIS — E119 Type 2 diabetes mellitus without complications: Secondary | ICD-10-CM

## 2013-10-15 LAB — COMPREHENSIVE METABOLIC PANEL
ALT: 8 U/L (ref 0–35)
AST: 11 U/L (ref 0–37)
Albumin: 3.7 g/dL (ref 3.5–5.2)
Alkaline Phosphatase: 53 U/L (ref 39–117)
BILIRUBIN TOTAL: 0.3 mg/dL (ref 0.3–1.2)
BUN: 14 mg/dL (ref 6–23)
CO2: 32 mEq/L (ref 19–32)
Calcium: 9.8 mg/dL (ref 8.4–10.5)
Chloride: 102 mEq/L (ref 96–112)
Creatinine, Ser: 0.8 mg/dL (ref 0.4–1.2)
GFR: 94.54 mL/min (ref >60.00)
Glucose, Bld: 98 mg/dL (ref 70–99)
Potassium: 3.7 mEq/L (ref 3.5–5.1)
SODIUM: 140 meq/L (ref 135–145)
TOTAL PROTEIN: 7.9 g/dL (ref 6.0–8.3)

## 2013-10-15 LAB — HEMOGLOBIN A1C: HEMOGLOBIN A1C: 6.5 % (ref 4.6–6.5)

## 2013-10-16 ENCOUNTER — Ambulatory Visit (INDEPENDENT_AMBULATORY_CARE_PROVIDER_SITE_OTHER): Payer: Medicare PPO | Admitting: Cardiology

## 2013-10-16 ENCOUNTER — Encounter: Payer: Self-pay | Admitting: Cardiology

## 2013-10-16 VITALS — BP 156/82 | HR 72 | Ht 64.0 in | Wt 186.0 lb

## 2013-10-16 DIAGNOSIS — E785 Hyperlipidemia, unspecified: Secondary | ICD-10-CM

## 2013-10-16 DIAGNOSIS — E119 Type 2 diabetes mellitus without complications: Secondary | ICD-10-CM

## 2013-10-16 DIAGNOSIS — I059 Rheumatic mitral valve disease, unspecified: Secondary | ICD-10-CM

## 2013-10-16 DIAGNOSIS — I058 Other rheumatic mitral valve diseases: Secondary | ICD-10-CM

## 2013-10-16 DIAGNOSIS — I10 Essential (primary) hypertension: Secondary | ICD-10-CM

## 2013-10-16 DIAGNOSIS — I350 Nonrheumatic aortic (valve) stenosis: Secondary | ICD-10-CM

## 2013-10-16 DIAGNOSIS — I359 Nonrheumatic aortic valve disorder, unspecified: Secondary | ICD-10-CM

## 2013-10-16 NOTE — Progress Notes (Signed)
[address] [place], [place]  [address] Phone: [phone] Fax:  [phone]  Date:  10/16/2013   ID:  Janet Williamson, DOB [birth year], MRN [id number]  PCP:  Astrid Divine, MD   History of Present Illness: Janet Williamson is a 73 y.o. female with mitral annular calcification/mass of 1.3 cm prior history of embolic CVA in 2008, here for followup. Cardiac catheterization showed no flow-limiting coronary artery disease in January of 2011. We are monitoring her mass on mitral valve/anulus.  Overall, she is doing quite well without any worsening dyspnea on exertion, no chest pain, no fevers, no chills. No strokelike symptoms that are new.  Echocardiogram as below from 2014. Moderate aortic stenosis was noted. She has a loud musical murmur. She is ambulating well. Continue with her medications    Wt Readings from Last 3 Encounters:  10/16/13 186 lb (84.369 kg)  07/14/13 182 lb (82.555 kg)  06/18/13 182 lb 6.4 oz (82.736 kg)     Past Medical History  Diagnosis Date  . Diabetes mellitus   . Hypertension   . Elevated cholesterol   . Cardiomyopathy in other disease   . Mitral valve disorder   . Heart murmur   . Generalized osteoarthritis   . Anemia   . Phlebitis   . Thrombophlebitis   . Stroke   . Cataracts, bilateral   . GERD (gastroesophageal reflux disease)   . Aortic stenosis     moderate AS 10/2012 echo (Dr. Anne Fu)    Past Surgical History  Procedure Laterality Date  . Abdominal hysterectomy    . Eye surgery      cataract removal  . Total knee arthroplasty Right 04/14/2013    Dr August Saucer  . Total knee arthroplasty Right 04/14/2013    Procedure: TOTAL KNEE ARTHROPLASTY;  Surgeon: Cammy Copa, MD;  Location: [facility] OR;  Service: Orthopedics;  Laterality: Right;    Current Outpatient Prescriptions  Medication Sig Dispense Refill  . amLODipine (NORVASC) 5 MG tablet Take 5 mg by mouth daily.      Marland Kitchen aspirin 81 MG tablet Take 81 mg by mouth  daily.      . Calcium Carbonate (CALCIUM 600 PO) Take 1 tablet by mouth daily.      . carboxymethylcellulose (REFRESH PLUS) 0.5 % SOLN Apply 1 drop to eye 2 (two) times daily as needed (for dry eyes).      . cholecalciferol (VITAMIN D) 1000 UNITS tablet Take 1,000 Units by mouth daily.      . clopidogrel (PLAVIX) 75 MG tablet Take 75 mg by mouth daily.      . hydrochlorothiazide (HYDRODIURIL) 25 MG tablet Take 25 mg by mouth daily.      . metFORMIN (GLUCOPHAGE-XR) 500 MG 24 hr tablet Take 1,000 mg by mouth daily with breakfast.       . metoprolol succinate (TOPROL-XL) 50 MG 24 hr tablet Take 50 mg by mouth daily. Take with or immediately following a meal.      . simvastatin (ZOCOR) 20 MG tablet Take 20 mg by mouth every evening.       No current facility-administered medications for this visit.    Allergies:    Allergies  Allergen Reactions  . Ace Inhibitors     angioedema  . Keflex [Cephalexin]   . Ramipril Swelling  . Rifadin [Rifampin]     Social History:  The patient  reports that she has never smoked. She has never used smokeless tobacco.  She reports that she does not drink alcohol or use illicit drugs.   ROS:  Please see the history of present illness.     PHYSICAL EXAM: VS:  BP 156/82  Pulse 72  Ht 5\' 4"  (1.626 m)  Wt 186 lb (84.369 kg)  BMI 31.91 kg/m2 Well nourished, well developed, in no acute distress HEENT: normal Neck: no JVDMinimally delayed carotid upstroke Cardiac:  normal S1, S2; RRR; 3/6 harsh musical murmur heard best at right upper sternal border with radiation to the carotid arteries Lungs:  clear to auscultation bilaterally, no wheezing, rhonchi or rales Abd: soft, nontender, no hepatomegaly Ext: no edema Skin: warm and dry Neuro: no focal abnormalities noted  Labs: 12/14-LDL 82 EKG:  None today    ECHO: 10/15/12  1. There is mild to moderate concentric left ventricle hypertrophy.  2. Left ventricular ejection fraction estimated by 2D at 55-60  percent.  3. There were no regional wall motion abnormalities.  4. Moderately thickened and calcified anterior mitral valve leaflet . No significant change from prior study.  5. Mild mitral valve regurgitation.  6. There is mild tricuspid regurgitation.  7. Mildly elevated estimated right ventricular systolic pressure.  8. Moderate calcification and thickening of the aortic valve.  9. Moderate aortic valve stenosis. Peak velocity is 3.2362m/s, mean gradient 24mmHg.  10. Mild aortic valve regurgitation.  11. Analysis of mitral valve inflow, pulmonary vein Doppler and tissue Doppler suggests grade Ia diastolic dysfunction with elevated left atrial pressure.  12. Prior aortic stenosis moderate 1/13 - 2.3983m/s, 20mmHg. This has progressed.  ASSESSMENT AND PLAN:  1. Moderate aortic stenosis-we will check echocardiogram. It is been one year. Harsh musical murmur. Radiation to the carotids noted. 2. Mitral valve/annular mass. Monitoring on echocardiogram. Previously did not appear as prominent. 3. Hyperlipidemia-continue with current therapy. LDL excellent 4. Hypertension-currently mildly elevated. Continue to monitor closely. No changes made today. 5. Diabetes-per primary team. Dr. Valentina LucksGriffin. 6. We will see back in 6 months.  Signed, Donato SchultzMark Migel Hannis, MD [facility]  10/16/2013 10:48 AM

## 2013-10-16 NOTE — Patient Instructions (Signed)
Your physician recommends that you continue on your current medications as directed. Please refer to the Current Medication list given to you today.  Your physician has requested that you have an echocardiogram. Echocardiography is a painless test that uses sound waves to create images of your heart. It provides your doctor with information about the size and shape of your heart and how well your heart's chambers and valves are working. This procedure takes approximately one hour. There are no restrictions for this procedure.  Your physician wants you to follow-up in: 6 months with Dr. Skains. You will receive a reminder letter in the mail two months in advance. If you don't receive a letter, please call our office to schedule the follow-up appointment.  

## 2013-10-19 ENCOUNTER — Encounter: Payer: Self-pay | Admitting: Endocrinology

## 2013-10-19 ENCOUNTER — Ambulatory Visit (INDEPENDENT_AMBULATORY_CARE_PROVIDER_SITE_OTHER): Payer: Medicare PPO | Admitting: Endocrinology

## 2013-10-19 VITALS — BP 126/64 | HR 75 | Temp 98.3°F | Resp 14 | Ht 63.25 in | Wt 185.5 lb

## 2013-10-19 DIAGNOSIS — E042 Nontoxic multinodular goiter: Secondary | ICD-10-CM | POA: Insufficient documentation

## 2013-10-19 DIAGNOSIS — E119 Type 2 diabetes mellitus without complications: Secondary | ICD-10-CM

## 2013-10-19 DIAGNOSIS — E7800 Pure hypercholesterolemia, unspecified: Secondary | ICD-10-CM

## 2013-10-19 DIAGNOSIS — I10 Essential (primary) hypertension: Secondary | ICD-10-CM

## 2013-10-19 DIAGNOSIS — D649 Anemia, unspecified: Secondary | ICD-10-CM

## 2013-10-19 NOTE — Progress Notes (Signed)
Patient ID: Janet Williamson, female   DOB: 1941/03/02, 73 y.o.   MRN: [id number]  Janet Williamson is an 73 y.o. female.   Reason for Appointment: Diabetes follow-up   History of Present Illness   Type 2 DIABETES MELITUS, date of diagnosis 02/2012     She has had mild diabetes which has generally been well controlled with metformin ER alone. Has had no side effects from metformin is taking 1000 mg a day She also has had diabetes education in 2013 She is checking her blood sugars periodically at home but recently only in the morning Previous A1c levels have been ranging from 6.1-6.4  Oral hypoglycemic drugs: Metformin alone        Side effects from medications: None Monitors blood glucose: Once a day.    Glucometer: One Touch.          Blood Glucose readings from meter download: Fasting readings range from 104-123, median 114, no nonfasting readings  Hypoglycemia frequency: Never.          Meals: 3 meals per day.       Physical activity: exercise: walks a little, was doing better last year            Wt Readings from Last 3 Encounters:  10/19/13 185 lb 8 oz (84.142 kg)  10/16/13 186 lb (84.369 kg)  07/14/13 182 lb (82.555 kg)   No history of diabetic complications Last eye exam: 10/22/12, goes annually   Lab Results  Component Value Date   HGBA1C 6.5 10/15/2013   HGBA1C 6.2 06/09/2013   Lab Results  Component Value Date   MICROALBUR 0.6 06/09/2013   LDLCALC 87 06/09/2013   CREATININE 0.8 10/15/2013     Appointment on 10/15/2013  Component Date Value Range Status  . Hemoglobin A1C 10/15/2013 6.5  4.6 - 6.5 % Final   Glycemic Control Guidelines for People with Diabetes:Non Diabetic:  <6%Goal of Therapy: <7%Additional Action Suggested:  >8%   . Sodium 10/15/2013 140  135 - 145 mEq/L Final  . Potassium 10/15/2013 3.7  3.5 - 5.1 mEq/L Final  . Chloride 10/15/2013 102  96 - 112 mEq/L Final  . CO2 10/15/2013 32  19 - 32 mEq/L Final  . Glucose, Bld 10/15/2013 98  70 - 99 mg/dL Final  .  BUN 14/43/1540 14  6 - 23 mg/dL Final  . Creatinine, Ser 10/15/2013 0.8  0.4 - 1.2 mg/dL Final  . Total Bilirubin 10/15/2013 0.3  0.3 - 1.2 mg/dL Final  . Alkaline Phosphatase 10/15/2013 53  39 - 117 U/L Final  . AST 10/15/2013 11  0 - 37 U/L Final  . ALT 10/15/2013 8  0 - 35 U/L Final  . Total Protein 10/15/2013 7.9  6.0 - 8.3 g/dL Final  . Albumin 08/67/6195 3.7  3.5 - 5.2 g/dL Final  . Calcium 09/32/6712 9.8  8.4 - 10.5 mg/dL Final  . GFR 45/80/9983 94.54  >60.00 mL/min Final      Medication List       This list is accurate as of: 10/19/13 10:46 AM.  Always use your most recent med list.               amLODipine 5 MG tablet  Commonly known as:  NORVASC  Take 5 mg by mouth daily.     aspirin 81 MG tablet  Take 81 mg by mouth daily.     CALCIUM 600 PO  Take 1 tablet by mouth daily.     carboxymethylcellulose 0.5 %  Soln  Commonly known as:  REFRESH PLUS  Apply 1 drop to eye 2 (two) times daily as needed (for dry eyes).     cholecalciferol 1000 UNITS tablet  Commonly known as:  VITAMIN D  Take 1,000 Units by mouth daily.     clopidogrel 75 MG tablet  Commonly known as:  PLAVIX  Take 75 mg by mouth daily.     hydrochlorothiazide 25 MG tablet  Commonly known as:  HYDRODIURIL  Take 25 mg by mouth daily.     metFORMIN 500 MG 24 hr tablet  Commonly known as:  GLUCOPHAGE-XR  Take 1,000 mg by mouth daily with breakfast.     metoprolol succinate 50 MG 24 hr tablet  Commonly known as:  TOPROL-XL  Take 50 mg by mouth daily. Take with or immediately following a meal.     simvastatin 20 MG tablet  Commonly known as:  ZOCOR  Take 20 mg by mouth every evening.        Allergies:  Allergies  Allergen Reactions  . Ace Inhibitors     angioedema  . Keflex [Cephalexin]   . Ramipril Swelling  . Rifadin [Rifampin]     Past Medical History  Diagnosis Date  . Diabetes mellitus   . Hypertension   . Elevated cholesterol   . Cardiomyopathy in other disease   . Mitral  valve disorder   . Heart murmur   . Generalized osteoarthritis   . Anemia   . Phlebitis   . Thrombophlebitis   . Stroke   . Cataracts, bilateral   . GERD (gastroesophageal reflux disease)   . Aortic stenosis     moderate AS 10/2012 echo (Dr. Anne Fu)    Past Surgical History  Procedure Laterality Date  . Abdominal hysterectomy    . Eye surgery      cataract removal  . Total knee arthroplasty Right 04/14/2013    Dr August Saucer  . Total knee arthroplasty Right 04/14/2013    Procedure: TOTAL KNEE ARTHROPLASTY;  Surgeon: Cammy Copa, MD;  Location: [facility] OR;  Service: Orthopedics;  Laterality: Right;    No family history on file.  Social History:  reports that she has never smoked. She has never used smokeless tobacco. She reports that she does not drink alcohol or use illicit drugs.  Review of Systems:  HYPERTENSION:  she has been managed with a 3 drug regimen, followed by PCP. No lightheadedness but her blood pressure is relatively low today She does not have a followup appointment with PCP in the next few days No pedal edema  HYPERLIPIDEMIA: The lipid abnormality consists of elevated LDL treated with simvastatin and LDL is controlled  Lab Results  Component Value Date   CHOL 147 06/09/2013   HDL 44.90 06/09/2013   LDLCALC 87 06/09/2013   TRIG 76.0 06/09/2013   CHOLHDL 3 06/09/2013    Has history of anemia, last Hb 10: Probably iron deficient but has not taken supplements recently. Does not know if she had her hemoglobin checked with PCP last month  Complains of feeling cold more recently, not too tired.  Multinodular goiter: She has had a long-standing multinodular goiter, last ultrasound was done in 05/2012 Recently had symptoms of pressure and choking which was better with a trial of levothyroxine but she has not taken this for the last few months Does not have any local pressure symptoms. Has not had any hypothyroidism previously  No history of numbness or tingling in her feet  Examination:   BP 126/64  Pulse 75  Temp(Src) 98.3 F (36.8 C)  Resp 14  Ht 5' 3.25" (1.607 m)  Wt 185 lb 8 oz (84.142 kg)  BMI 32.58 kg/m2  SpO2 98%  Body mass index is 32.58 kg/(m^2).   She has a bilateral multinodular goiter, about 2.5 times normal, firm and smooth Biceps reflexes are normal  Foot exam done today  ASSESSMENT/ PLAN::   Diabetes type 2   The patient's diabetes control appears to be fairly good with A1c 8.5 She has however gained weight from inconsistent exercise and diet Fasting readings are upper normal but adequate Previously has had relatively higher postprandial readings She will continue low dose metformin and hopefully will be able to lose weight by the next visit Discussed checking blood sugars at various times to help adjust her medication, including some postprandial readings which he has not been doing Today discussed balanced meals with modest portions  HYPOKALEMIA: Resolved  Hypertension: Well controlled with metoprolol, Norvasc and HCTZ, followed by PCP  Multinodular goiter: Clinically appears stable. She does not have any local pressure symptoms now will not restart thyroid suppression. Will check TSH on her next visit  Cold intolerance: Likely to be from her anemia. She can start iron supplements again and followup with PCP  [facility] CampusKUMAR,Kennady Zimmerle 10/19/2013, 10:46 AM

## 2013-10-19 NOTE — Patient Instructions (Addendum)
Exercise 4 days a week  See Dr Valentina Lucks for anemia, meanwhile may start iron supplements  Please check blood sugars at least half the time about 2 hours after any meal and 2x per week on waking up. Please bring blood sugar monitor to each visit

## 2013-10-26 ENCOUNTER — Ambulatory Visit
Admission: RE | Admit: 2013-10-26 | Discharge: 2013-10-26 | Disposition: A | Discharge status: Home / Self Care | Payer: Commercial Managed Care - HMO | Source: Ambulatory Visit | Attending: Family Medicine | Admitting: Family Medicine

## 2013-10-26 DIAGNOSIS — R928 Other abnormal and inconclusive findings on diagnostic imaging of breast: Secondary | ICD-10-CM

## 2013-11-03 ENCOUNTER — Ambulatory Visit (HOSPITAL_COMMUNITY): Payer: Medicare PPO | Attending: Cardiology | Admitting: Radiology

## 2013-11-03 ENCOUNTER — Encounter: Payer: Self-pay | Admitting: Cardiology

## 2013-11-03 DIAGNOSIS — Z6831 Body mass index (BMI) 31.0-31.9, adult: Secondary | ICD-10-CM | POA: Insufficient documentation

## 2013-11-03 DIAGNOSIS — I079 Rheumatic tricuspid valve disease, unspecified: Secondary | ICD-10-CM | POA: Insufficient documentation

## 2013-11-03 DIAGNOSIS — I10 Essential (primary) hypertension: Secondary | ICD-10-CM | POA: Insufficient documentation

## 2013-11-03 DIAGNOSIS — E119 Type 2 diabetes mellitus without complications: Secondary | ICD-10-CM | POA: Insufficient documentation

## 2013-11-03 DIAGNOSIS — I359 Nonrheumatic aortic valve disorder, unspecified: Secondary | ICD-10-CM

## 2013-11-03 DIAGNOSIS — I059 Rheumatic mitral valve disease, unspecified: Secondary | ICD-10-CM | POA: Insufficient documentation

## 2013-11-03 DIAGNOSIS — I350 Nonrheumatic aortic (valve) stenosis: Secondary | ICD-10-CM

## 2013-11-03 DIAGNOSIS — E669 Obesity, unspecified: Secondary | ICD-10-CM | POA: Insufficient documentation

## 2013-11-03 DIAGNOSIS — E785 Hyperlipidemia, unspecified: Secondary | ICD-10-CM | POA: Insufficient documentation

## 2013-11-03 NOTE — Progress Notes (Signed)
Echocardiogram performed.  

## 2013-11-04 ENCOUNTER — Telehealth: Payer: Self-pay | Admitting: Cardiology

## 2013-11-04 NOTE — Telephone Encounter (Signed)
Echo results

## 2014-01-13 ENCOUNTER — Other Ambulatory Visit (INDEPENDENT_AMBULATORY_CARE_PROVIDER_SITE_OTHER): Payer: Medicare PPO

## 2014-01-13 DIAGNOSIS — E119 Type 2 diabetes mellitus without complications: Secondary | ICD-10-CM

## 2014-01-13 DIAGNOSIS — E042 Nontoxic multinodular goiter: Secondary | ICD-10-CM

## 2014-01-13 DIAGNOSIS — E7800 Pure hypercholesterolemia, unspecified: Secondary | ICD-10-CM

## 2014-01-13 DIAGNOSIS — D649 Anemia, unspecified: Secondary | ICD-10-CM

## 2014-01-13 LAB — BASIC METABOLIC PANEL
BUN: 10 mg/dL (ref 6–23)
CHLORIDE: 103 meq/L (ref 96–112)
CO2: 30 mEq/L (ref 19–32)
Calcium: 9.5 mg/dL (ref 8.4–10.5)
Creatinine, Ser: 0.7 mg/dL (ref 0.4–1.2)
GFR: 112.87 mL/min (ref >60.00)
Glucose, Bld: 114 mg/dL — ABNORMAL HIGH (ref 70–99)
POTASSIUM: 3.8 meq/L (ref 3.5–5.1)
Sodium: 139 mEq/L (ref 135–145)

## 2014-01-13 LAB — CBC
HEMATOCRIT: 35 % — AB (ref 36.0–46.0)
HEMOGLOBIN: 11.2 g/dL — AB (ref 12.0–15.0)
MCHC: 31.9 g/dL (ref 30.0–36.0)
MCV: 74.9 fl — ABNORMAL LOW (ref 78.0–100.0)
Platelets: 371 10*3/uL (ref 150.0–400.0)
RBC: 4.67 Mil/uL (ref 3.87–5.11)
RDW: 17.6 % — AB (ref 11.5–14.6)
WBC: 9.5 10*3/uL (ref 4.5–10.5)

## 2014-01-13 LAB — HEMOGLOBIN A1C: HEMOGLOBIN A1C: 6.7 % — AB (ref 4.6–6.5)

## 2014-01-13 LAB — TSH: TSH: 1.52 u[IU]/mL (ref 0.35–5.50)

## 2014-01-13 LAB — LIPID PANEL
CHOL/HDL RATIO: 3
Cholesterol: 133 mg/dL (ref 0–200)
HDL: 44.4 mg/dL (ref >39.00)
LDL CALC: 74 mg/dL (ref 0–99)
TRIGLYCERIDES: 73 mg/dL (ref 0.0–149.0)
VLDL: 14.6 mg/dL (ref 0.0–40.0)

## 2014-01-13 LAB — T4, FREE: Free T4: 0.94 ng/dL (ref 0.60–1.60)

## 2014-01-18 ENCOUNTER — Ambulatory Visit (INDEPENDENT_AMBULATORY_CARE_PROVIDER_SITE_OTHER): Payer: Commercial Managed Care - HMO | Admitting: Endocrinology

## 2014-01-18 ENCOUNTER — Encounter: Payer: Self-pay | Admitting: Endocrinology

## 2014-01-18 VITALS — BP 122/60 | HR 78 | Temp 98.3°F | Resp 16 | Ht 63.25 in | Wt 190.0 lb

## 2014-01-18 DIAGNOSIS — IMO0001 Reserved for inherently not codable concepts without codable children: Secondary | ICD-10-CM

## 2014-01-18 DIAGNOSIS — E1165 Type 2 diabetes mellitus with hyperglycemia: Principal | ICD-10-CM

## 2014-01-18 NOTE — Progress Notes (Signed)
Patient ID: Janet Williamson, female   DOB: 01/16/1941, 73 y.o.   MRN: [id number]   Reason for Appointment: Diabetes follow-up   History of Present Illness   Type 2 DIABETES MELITUS, date of diagnosis 02/2012     She has had mild diabetes which has been well controlled with metformin ER alone. Has had no side effects from metformin is taking 1000 mg a day  She also has had diabetes education in 2013 She is checking her blood sugars periodically at home and most of them are near-normal Previous A1c levels have been ranging from 6.1-6.4 A1c is relatively higher and she also has gained some weight probably from inconsistent diet and activity during the winter months  Oral hypoglycemic drugs: Metformin alone        Side effects from medications: None Monitors blood glucose: Once a day.    Glucometer: One Touch.          Blood Glucose readings from meter download: Fasting readings range from 98-102, nonfasting 96-158 Overall median 106  Hypoglycemia frequency: Never.          Meals: 3 meals per day. Dinner 5-6      Physical activity: exercise: walks a little now          Wt Readings from Last 3 Encounters:  01/18/14 190 lb (86.183 kg)  10/19/13 185 lb 8 oz (84.142 kg)  10/16/13 186 lb (84.369 kg)   No history of diabetic complications Last eye exam: 10/22/13, goes annually   Lab Results  Component Value Date   HGBA1C 6.7* 01/13/2014   HGBA1C 6.5 10/15/2013   HGBA1C 6.2 06/09/2013   Lab Results  Component Value Date   MICROALBUR 0.6 06/09/2013   LDLCALC 74 01/13/2014   CREATININE 0.7 01/13/2014     Appointment on 01/13/2014  Component Date Value Ref Range Status  . Hemoglobin A1C 01/13/2014 6.7* 4.6 - 6.5 % Final   Glycemic Control Guidelines for People with Diabetes:Non Diabetic:  <6%Goal of Therapy: <7%Additional Action Suggested:  >8%   . Sodium 01/13/2014 139  135 - 145 mEq/L Final  . Potassium 01/13/2014 3.8  3.5 - 5.1 mEq/L Final  . Chloride 01/13/2014 103  96 - 112 mEq/L Final   . CO2 01/13/2014 30  19 - 32 mEq/L Final  . Glucose, Bld 01/13/2014 114* 70 - 99 mg/dL Final  . BUN 40/98/119104/05/2014 10  6 - 23 mg/dL Final  . Creatinine, Ser 01/13/2014 0.7  0.4 - 1.2 mg/dL Final  . Calcium 47/82/956204/05/2014 9.5  8.4 - 10.5 mg/dL Final  . GFR 13/08/657804/05/2014 112.87  >60.00 mL/min Final  . Cholesterol 01/13/2014 133  0 - 200 mg/dL Final   ATP III Classification       Desirable:  < 200 mg/dL               Borderline High:  200 - 239 mg/dL          High:  > = 469240 mg/dL  . Triglycerides 01/13/2014 73.0  0.0 - 149.0 mg/dL Final   Normal:  <629<150 mg/dLBorderline High:  150 - 199 mg/dL  . HDL 01/13/2014 44.40  >39.00 mg/dL Final  . VLDL 52/84/132404/05/2014 14.6  0.0 - 40.0 mg/dL Final  . LDL Cholesterol 01/13/2014 74  0 - 99 mg/dL Final  . Total CHOL/HDL Ratio 01/13/2014 3   Final                  Men  Women1/2 Average Risk     3.4          3.3Average Risk          5.0          4.42X Average Risk          9.6          7.13X Average Risk          15.0          11.0                      . TSH 01/13/2014 1.52  0.35 - 5.50 uIU/mL Final  . Free T4 01/13/2014 0.94  0.60 - 1.60 ng/dL Final  . WBC 16/07/9603 9.5  4.5 - 10.5 K/uL Final  . RBC 01/13/2014 4.67  3.87 - 5.11 Mil/uL Final  . Platelets 01/13/2014 371.0  150.0 - 400.0 K/uL Final  . Hemoglobin 01/13/2014 11.2* 12.0 - 15.0 g/dL Final  . HCT 54/06/8118 35.0* 36.0 - 46.0 % Final  . MCV 01/13/2014 74.9* 78.0 - 100.0 fl Final  . MCHC 01/13/2014 31.9  30.0 - 36.0 g/dL Final  . RDW 14/78/2956 17.6* 11.5 - 14.6 % Final      Medication List       This list is accurate as of: 01/18/14 10:52 AM.  Always use your most recent med list.               amLODipine 5 MG tablet  Commonly known as:  NORVASC  Take 5 mg by mouth daily.     aspirin 81 MG tablet  Take 81 mg by mouth daily.     CALCIUM 600 PO  Take 1 tablet by mouth daily.     carboxymethylcellulose 0.5 % Soln  Commonly known as:  REFRESH PLUS  Apply 1 drop to eye 2 (two) times daily  as needed (for dry eyes).     cholecalciferol 1000 UNITS tablet  Commonly known as:  VITAMIN D  Take 1,000 Units by mouth daily.     clopidogrel 75 MG tablet  Commonly known as:  PLAVIX  Take 75 mg by mouth daily.     hydrochlorothiazide 25 MG tablet  Commonly known as:  HYDRODIURIL  Take 25 mg by mouth daily.     metFORMIN 500 MG 24 hr tablet  Commonly known as:  GLUCOPHAGE-XR  Take 1,000 mg by mouth daily with breakfast.     metoprolol succinate 50 MG 24 hr tablet  Commonly known as:  TOPROL-XL  Take 50 mg by mouth daily. Take with or immediately following a meal.     simvastatin 20 MG tablet  Commonly known as:  ZOCOR  Take 20 mg by mouth every evening.        Allergies:  Allergies  Allergen Reactions  . Ace Inhibitors     angioedema  . Keflex [Cephalexin]   . Ramipril Swelling  . Rifadin [Rifampin]     Past Medical History  Diagnosis Date  . Diabetes mellitus   . Hypertension   . Elevated cholesterol   . Cardiomyopathy in other disease   . Mitral valve disorder   . Heart murmur   . Generalized osteoarthritis   . Anemia   . Phlebitis   . Thrombophlebitis   . Stroke   . Cataracts, bilateral   . GERD (gastroesophageal reflux disease)   . Aortic stenosis     moderate AS 10/2012 echo (Dr. Anne Fu)  Past Surgical History  Procedure Laterality Date  . Abdominal hysterectomy    . Eye surgery      cataract removal  . Total knee arthroplasty Right 04/14/2013    Dr August Saucer  . Total knee arthroplasty Right 04/14/2013    Procedure: TOTAL KNEE ARTHROPLASTY;  Surgeon: Cammy Copa, MD;  Location: [facility] OR;  Service: Orthopedics;  Laterality: Right;    No family history on file.  Social History:  reports that she has never smoked. She has never used smokeless tobacco. She reports that she does not drink alcohol or use illicit drugs.  Review of Systems:  HYPERTENSION:  she has been managed with a 3 drug regimen, followed by PCP. No lightheadedness but her  blood pressure is relatively low today She does not have a followup appointment with PCP in the next few days No pedal edema  HYPERLIPIDEMIA: The lipid abnormality consists of elevated LDL treated with simvastatin and LDL is controlled  Lab Results  Component Value Date   CHOL 133 01/13/2014   HDL 44.40 01/13/2014   LDLCALC 74 01/13/2014   TRIG 73.0 01/13/2014   CHOLHDL 3 01/13/2014    Has history of anemia followed by PCP  Multinodular goiter: She has had a long-standing multinodular goiter, last ultrasound was done in 05/2012 Does not have any local pressure symptoms. Has not had any hypothyroidism previously  Lab Results  Component Value Date   TSH 1.52 01/13/2014    No history of numbness or tingling in her feet Foot exam done in 1/15   Examination:   BP 122/60  Pulse 78  Temp(Src) 98.3 F (36.8 C)  Resp 16  Ht 5' 3.25" (1.607 m)  Wt 190 lb (86.183 kg)  BMI 33.37 kg/m2  SpO2 98%  Body mass index is 33.37 kg/(m^2).   No edema  ASSESSMENT/ PLAN:   Diabetes type 2   The patient's diabetes control appears to be fairly good with A1c 6.7 She has however gained weight from inconsistent exercise and diet Home readings are upper normal but reasonably good including some nonfasting readings Previously has had relatively higher postprandial readings She will continue low dose metformin and hopefully will be able to lose weight by the next visit Consider increasing metformin or adding Januvia if blood sugars are higher   Hypertension: Well controlled with metoprolol, Norvasc and HCTZ, followed by PCP   Lipids: LDL is at target  Reather Littler 01/18/2014, 10:52 AM

## 2014-04-22 ENCOUNTER — Encounter: Payer: Self-pay | Admitting: Cardiology

## 2014-04-22 ENCOUNTER — Ambulatory Visit (INDEPENDENT_AMBULATORY_CARE_PROVIDER_SITE_OTHER): Payer: Commercial Managed Care - HMO | Admitting: Cardiology

## 2014-04-22 VITALS — BP 150/68 | HR 66 | Ht 61.0 in | Wt 188.0 lb

## 2014-04-22 DIAGNOSIS — I359 Nonrheumatic aortic valve disorder, unspecified: Secondary | ICD-10-CM | POA: Diagnosis not present

## 2014-04-22 DIAGNOSIS — I350 Nonrheumatic aortic (valve) stenosis: Secondary | ICD-10-CM

## 2014-04-22 NOTE — Progress Notes (Signed)
[address] [place], [place]  [address] Phone: [phone] Fax:  [phone]  Date:  04/22/2014   ID:  Janet Williamson, DOB Aug 19, 1941, MRN [id number]  PCP:  Astrid Divine, MD   History of Present Illness: Janet Williamson is a 73 y.o. female with mitral annular calcification/mass of 1.3 cm prior history of embolic CVA in 2008, here for followup. Cardiac catheterization showed no flow-limiting coronary artery disease in January of 2011. We are monitoring her mass on mitral valve/anulus.  Overall, she is doing quite well without any worsening dyspnea on exertion, no chest pain, no fevers, no chills. No strokelike symptoms that are new.  Echocardiogram as below from 2014. Moderate aortic stenosis was noted. She has a loud musical murmur. She is ambulating well. Continue with her medications    Wt Readings from Last 3 Encounters:  04/22/14 188 lb (85.276 kg)  01/18/14 190 lb (86.183 kg)  10/19/13 185 lb 8 oz (84.142 kg)     Past Medical History  Diagnosis Date  . Diabetes mellitus   . Hypertension   . Elevated cholesterol   . Cardiomyopathy in other disease   . Mitral valve disorder   . Heart murmur   . Generalized osteoarthritis   . Anemia   . Phlebitis   . Thrombophlebitis   . Stroke   . Cataracts, bilateral   . GERD (gastroesophageal reflux disease)   . Aortic stenosis     moderate AS 10/2012 echo (Dr. Anne Fu)    Past Surgical History  Procedure Laterality Date  . Abdominal hysterectomy    . Eye surgery      cataract removal  . Total knee arthroplasty Right 04/14/2013    Dr August Saucer  . Total knee arthroplasty Right 04/14/2013    Procedure: TOTAL KNEE ARTHROPLASTY;  Surgeon: Cammy Copa, MD;  Location: [facility] OR;  Service: Orthopedics;  Laterality: Right;    Current Outpatient Prescriptions  Medication Sig Dispense Refill  . amLODipine (NORVASC) 5 MG tablet Take 5 mg by mouth daily.      Marland Kitchen aspirin 81 MG tablet Take 81 mg by mouth  daily.      . Calcium Carbonate (CALCIUM 600 PO) Take 1 tablet by mouth daily.      . carboxymethylcellulose (REFRESH PLUS) 0.5 % SOLN Apply 1 drop to eye 2 (two) times daily as needed (for dry eyes).      . cholecalciferol (VITAMIN D) 1000 UNITS tablet Take 1,000 Units by mouth daily.      . clopidogrel (PLAVIX) 75 MG tablet Take 75 mg by mouth daily.      . hydrochlorothiazide (HYDRODIURIL) 25 MG tablet Take 25 mg by mouth daily.      . metFORMIN (GLUCOPHAGE-XR) 500 MG 24 hr tablet Take 1,000 mg by mouth daily with breakfast.       . metoprolol succinate (TOPROL-XL) 50 MG 24 hr tablet Take 50 mg by mouth daily. Take with or immediately following a meal.      . simvastatin (ZOCOR) 20 MG tablet Take 20 mg by mouth every evening.       No current facility-administered medications for this visit.    Allergies:    Allergies  Allergen Reactions  . Ace Inhibitors     angioedema  . Keflex [Cephalexin]   . Ramipril Swelling  . Rifadin [Rifampin]     Social History:  The patient  reports that she has never smoked. She has never used smokeless tobacco.  She reports that she does not drink alcohol or use illicit drugs.   ROS:  Please see the history of present illness.     PHYSICAL EXAM: VS:  BP 150/68  Pulse 66  Ht 5\' 1"  (1.549 m)  Wt 188 lb (85.276 kg)  BMI 35.54 kg/m2 Well nourished, well developed, in no acute distress HEENT: normal Neck: no JVDMinimally delayed carotid upstroke Cardiac:  normal S1, S2; RRR; 3/6 harsh musical murmur heard best at right upper sternal border with radiation to the carotid arteries Lungs:  clear to auscultation bilaterally, no wheezing, rhonchi or rales Abd: soft, nontender, no hepatomegaly Ext: no edema Skin: warm and dry Neuro: no focal abnormalities noted  Labs: 12/14-LDL 82 EKG:  04/22/14: NSR 66, NSSTW changes   ECHO: 11/03/13:  - Left ventricle: The cavity size was normal. There was moderate focal basal and mild concentric  hypertrophy. Systolic function was vigorous. The estimated ejection fraction was in the range of 65% to 70%. Wall motion was normal; there were no regional wall motion abnormalities. There was an increased relative contribution of atrial contraction to ventricular filling. Features are consistent with a pseudonormal left ventricular filling pattern, with concomitant abnormal relaxation and increased filling pressure (grade 2 diastolic dysfunction). - Aortic valve: Severe thickening and calcification, consistent with sclerosis. There was moderate stenosis. Mild regurgitation. Mean gradient: 33mm Hg (S). Peak gradient: 52mm Hg (S). - Mitral valve: Calcified annulus. Mildly thickened leaflets . Mild regurgitation. - Left atrium: The atrium was mildly dilated. - Atrial septum: There was increased thickness of the septum, consistent with lipomatous hypertrophy. - Pulmonary arteries: PA peak pressure: 51mm Hg (S). Impressions:  - The right ventricular systolic pressure was increased consistent with mild pulmonary hypertension.    ASSESSMENT AND PLAN:  1. Moderate aortic stenosis- Repeat yearly. Harsh musical murmur. Radiation to the carotids noted. When severe and associated with symptoms, aortic valve replacement. 2. Mitral valve/annular mass. Monitoring on echocardiogram. Previously did not appear as prominent. 3. Hyperlipidemia-continue with current therapy. LDL excellent 4. Hypertension-currently mildly elevated. Continue to monitor closely. No changes made today. 5. Diabetes-per primary team. Dr. Valentina LucksGriffin. 6. We will see back in 6 months.  Signed, Donato SchultzMark Marqueze Ramcharan, MD [facility]  04/22/2014 10:56 AM

## 2014-04-22 NOTE — Patient Instructions (Signed)
The current medical regimen is effective;  continue present plan and medications.  Your physician has requested that you have an echocardiogram in January. Echocardiography is a painless test that uses sound waves to create images of your heart. It provides your doctor with information about the size and shape of your heart and how well your heart's chambers and valves are working. This procedure takes approximately one hour. There are no restrictions for this procedure.  Follow up in 6 months with Dr Anne Fu.  You will receive a letter in the mail 2 months before you are due.  Please call us when you receive this letter to schedule your follow up appointment.

## 2014-04-27 ENCOUNTER — Other Ambulatory Visit: Payer: Self-pay | Admitting: Orthopedic Surgery

## 2014-04-27 DIAGNOSIS — M79604 Pain in right leg: Secondary | ICD-10-CM

## 2014-05-10 ENCOUNTER — Ambulatory Visit
Admission: RE | Admit: 2014-05-10 | Discharge: 2014-05-10 | Disposition: A | Discharge status: Home / Self Care | Payer: Commercial Managed Care - HMO | Source: Ambulatory Visit | Attending: Orthopedic Surgery | Admitting: Orthopedic Surgery

## 2014-05-10 DIAGNOSIS — M79604 Pain in right leg: Secondary | ICD-10-CM

## 2014-06-04 ENCOUNTER — Other Ambulatory Visit: Payer: Commercial Managed Care - HMO

## 2014-06-04 DIAGNOSIS — E1165 Type 2 diabetes mellitus with hyperglycemia: Principal | ICD-10-CM

## 2014-06-04 DIAGNOSIS — IMO0001 Reserved for inherently not codable concepts without codable children: Secondary | ICD-10-CM

## 2014-06-04 LAB — COMPREHENSIVE METABOLIC PANEL
ALBUMIN: 3.5 g/dL (ref 3.5–5.2)
ALK PHOS: 52 U/L (ref 39–117)
ALT: 9 U/L (ref 0–35)
AST: 11 U/L (ref 0–37)
BUN: 12 mg/dL (ref 6–23)
CO2: 33 mEq/L — ABNORMAL HIGH (ref 19–32)
Calcium: 9.6 mg/dL (ref 8.4–10.5)
Chloride: 101 mEq/L (ref 96–112)
Creatinine, Ser: 0.8 mg/dL (ref 0.4–1.2)
GFR: 91.62 mL/min (ref >60.00)
Glucose, Bld: 102 mg/dL — ABNORMAL HIGH (ref 70–99)
Potassium: 3.7 mEq/L (ref 3.5–5.1)
Sodium: 138 mEq/L (ref 135–145)
Total Bilirubin: 0.2 mg/dL (ref 0.2–1.2)
Total Protein: 8.1 g/dL (ref 6.0–8.3)

## 2014-06-04 LAB — HEMOGLOBIN A1C: HEMOGLOBIN A1C: 6.6 % — AB (ref 4.6–6.5)

## 2014-06-04 LAB — MICROALBUMIN / CREATININE URINE RATIO
Creatinine,U: 107.2 mg/dL
MICROALB UR: 0.1 mg/dL (ref 0.0–1.9)
MICROALB/CREAT RATIO: 0.1 mg/g (ref 0.0–30.0)

## 2014-06-07 ENCOUNTER — Other Ambulatory Visit: Payer: Self-pay | Admitting: *Deleted

## 2014-06-07 MED ORDER — METFORMIN HCL ER 500 MG PO TB24: ORAL_TABLET | ORAL | Status: DC

## 2014-06-09 ENCOUNTER — Ambulatory Visit: Payer: Commercial Managed Care - HMO | Admitting: Endocrinology

## 2014-07-06 ENCOUNTER — Ambulatory Visit (INDEPENDENT_AMBULATORY_CARE_PROVIDER_SITE_OTHER): Payer: Commercial Managed Care - HMO | Admitting: Endocrinology

## 2014-07-06 ENCOUNTER — Encounter: Payer: Self-pay | Admitting: Endocrinology

## 2014-07-06 VITALS — BP 130/76 | HR 77 | Temp 97.8°F | Resp 16 | Ht 61.0 in | Wt 192.0 lb

## 2014-07-06 DIAGNOSIS — E785 Hyperlipidemia, unspecified: Secondary | ICD-10-CM

## 2014-07-06 DIAGNOSIS — E119 Type 2 diabetes mellitus without complications: Secondary | ICD-10-CM

## 2014-07-06 NOTE — Progress Notes (Signed)
Patient ID: Janet Williamson, female   DOB: 07/27/1941, 73 y.o.   MRN: [id number]   Reason for Appointment: Diabetes follow-up   History of Present Illness   Type 2 DIABETES MELITUS, date of diagnosis 02/2012     She has had mild diabetes which has been well controlled with metformin ER alone. Has had no side effects from metformin is taking only 1000 mg a day  She also has had diabetes education in 2013 She is checking her blood sugars regularly at home including after meals and most of them are near-normal Previous A1c levels had been ranging from 6.1-6.4 A1c is relatively higher  She has difficulty losing weight even though she is trying to walk more regularly recently  Oral hypoglycemic drugs: Metformin 1g        Side effects from medications: None Monitors blood glucose:  0.4 times a day     Glucometer: One Touch.          Blood Glucose readings from meter download:   PREMEAL Breakfast Lunch Dinner  PCS  Overall  Glucose range:  98   137, 151   97, 133   99-152    Mean/median:     115   116   Hypoglycemia frequency: Never.          Meals: 3 meals per day. Dinner 5 pm      Physical activity: exercise: walks at times         Wt Readings from Last 3 Encounters:  07/06/14 192 lb (87.091 kg)  04/22/14 188 lb (85.276 kg)  01/18/14 190 lb (86.183 kg)   No history of diabetic complications Last eye exam: 10/22/13, goes annually   Lab Results  Component Value Date   HGBA1C 6.6* 06/04/2014   HGBA1C 6.7* 01/13/2014   HGBA1C 6.5 10/15/2013   Lab Results  Component Value Date   MICROALBUR 0.1 06/04/2014   LDLCALC 74 01/13/2014   CREATININE 0.8 06/04/2014     No visits with results within 1 Week(s) from this visit. Latest known visit with results is:  Appointment on 06/04/2014  Component Date Value Ref Range Status  . Microalb, Ur 06/04/2014 0.1  0.0 - 1.9 mg/dL Final  . Creatinine,U 40/98/1191 107.2   Final  . Microalb Creat Ratio 06/04/2014 0.1  0.0 - 30.0 mg/g Final  .  Hemoglobin A1C 06/04/2014 6.6* 4.6 - 6.5 % Final   Glycemic Control Guidelines for People with Diabetes:Non Diabetic:  <6%Goal of Therapy: <7%Additional Action Suggested:  >8%   . Sodium 06/04/2014 138  135 - 145 mEq/L Final  . Potassium 06/04/2014 3.7  3.5 - 5.1 mEq/L Final  . Chloride 06/04/2014 101  96 - 112 mEq/L Final  . CO2 06/04/2014 33* 19 - 32 mEq/L Final  . Glucose, Bld 06/04/2014 102* 70 - 99 mg/dL Final  . BUN 47/82/9562 12  6 - 23 mg/dL Final  . Creatinine, Ser 06/04/2014 0.8  0.4 - 1.2 mg/dL Final  . Total Bilirubin 06/04/2014 0.2  0.2 - 1.2 mg/dL Final  . Alkaline Phosphatase 06/04/2014 52  39 - 117 U/L Final  . AST 06/04/2014 11  0 - 37 U/L Final  . ALT 06/04/2014 9  0 - 35 U/L Final  . Total Protein 06/04/2014 8.1  6.0 - 8.3 g/dL Final  . Albumin 13/05/6577 3.5  3.5 - 5.2 g/dL Final  . Calcium 46/96/2952 9.6  8.4 - 10.5 mg/dL Final  . GFR 84/13/2440 91.62  >60.00 mL/min Final  Medication List       This list is accurate as of: 07/06/14 11:12 AM.  Always use your most recent med list.               amLODipine 5 MG tablet  Commonly known as:  NORVASC  Take 5 mg by mouth daily.     aspirin 81 MG tablet  Take 81 mg by mouth daily.     CALCIUM 600 PO  Take 1 tablet by mouth daily.     carboxymethylcellulose 0.5 % Soln  Commonly known as:  REFRESH PLUS  Apply 1 drop to eye 2 (two) times daily as needed (for dry eyes).     cholecalciferol 1000 UNITS tablet  Commonly known as:  VITAMIN D  Take 1,000 Units by mouth daily.     clopidogrel 75 MG tablet  Commonly known as:  PLAVIX  Take 75 mg by mouth daily.     hydrochlorothiazide 25 MG tablet  Commonly known as:  HYDRODIURIL  Take 25 mg by mouth daily.     metFORMIN 500 MG 24 hr tablet  Commonly known as:  GLUCOPHAGE-XR  Take 1 1/2 tablets daily     metoprolol succinate 50 MG 24 hr tablet  Commonly known as:  TOPROL-XL  Take 50 mg by mouth daily. Take with or immediately following a meal.      ONE TOUCH ULTRA TEST test strip  Generic drug:  glucose blood     simvastatin 20 MG tablet  Commonly known as:  ZOCOR  Take 20 mg by mouth every evening.        Allergies:  Allergies  Allergen Reactions  . Ace Inhibitors     angioedema  . Keflex [Cephalexin]   . Ramipril Swelling  . Rifadin [Rifampin]     Past Medical History  Diagnosis Date  . Diabetes mellitus   . Hypertension   . Elevated cholesterol   . Cardiomyopathy in other disease   . Mitral valve disorder   . Heart murmur   . Generalized osteoarthritis   . Anemia   . Phlebitis   . Thrombophlebitis   . Stroke   . Cataracts, bilateral   . GERD (gastroesophageal reflux disease)   . Aortic stenosis     moderate AS 10/2012 echo (Dr. Anne FuSkains)    Past Surgical History  Procedure Laterality Date  . Abdominal hysterectomy    . Eye surgery      cataract removal  . Total knee arthroplasty Right 04/14/2013    Dr August Saucerean  . Total knee arthroplasty Right 04/14/2013    Procedure: TOTAL KNEE ARTHROPLASTY;  Surgeon: Cammy CopaGregory Scott Dean, MD;  [facility];  Service: Orthopedics;  Laterality: Right;    No family history on file.  Social History:  reports that she has never smoked. She has never used smokeless tobacco. She reports that she does not drink alcohol or use illicit drugs.  Review of Systems:  HYPERTENSION:  she has been managed with a 3 drug regimen, followed by PCP.  No pedal edema  HYPERLIPIDEMIA: The lipid abnormality consists of elevated LDL treated with simvastatin and LDL is controlled  Lab Results  Component Value Date   CHOL 133 01/13/2014   HDL 44.40 01/13/2014   LDLCALC 74 01/13/2014   TRIG 73.0 01/13/2014   CHOLHDL 3 01/13/2014    Has history of anemia followed by PCP  Multinodular goiter: She has had a long-standing multinodular goiter, last ultrasound was done in 05/2012 Does not have  any local pressure symptoms. Has not had any hypothyroidism previously  Lab Results  Component Value Date    TSH 1.52 01/13/2014    No history of numbness or tingling in her feet Foot exam done in 1/15   Examination:   BP 130/76  Pulse 77  Temp(Src) 97.8 F (36.6 C)  Resp 16  Ht 5\' 1"  (1.549 m)  Wt 192 lb (87.091 kg)  BMI 36.30 kg/m2  SpO2 97%  Body mass index is 36.3 kg/(m^2).   No edema  ASSESSMENT/ PLAN:   Diabetes type 2   The patient's diabetes control appears to be fairly good with A1c  6.6 on metformin alone  Considering her age and other problems she has good control and her home readings are looking excellent including after meals  She has generally done better with trying to exercise and encouraged her to continue   Consider increasing metformin or adding Januvia if blood sugars are higher Followup in 6 months again   Hypertension: Well controlled with metoprolol, Norvasc and HCTZ, followed by PCP  Influenza vaccine given  [facility] 07/06/2014, 11:12 AM

## 2014-07-06 NOTE — Patient Instructions (Addendum)
Keep walking!  Metformin same dose  Please check blood sugars at least half the time about 2 hours after any meal and times 2 per week on waking up. Please bring blood sugar monitor to each visit

## 2014-07-08 ENCOUNTER — Other Ambulatory Visit: Payer: Self-pay | Admitting: Endocrinology

## 2014-10-26 ENCOUNTER — Other Ambulatory Visit (HOSPITAL_COMMUNITY): Payer: Commercial Managed Care - HMO

## 2014-10-26 ENCOUNTER — Ambulatory Visit: Payer: Commercial Managed Care - HMO | Admitting: Cardiology

## 2014-11-01 ENCOUNTER — Other Ambulatory Visit (HOSPITAL_COMMUNITY): Payer: Self-pay | Admitting: Cardiology

## 2014-11-01 DIAGNOSIS — I359 Nonrheumatic aortic valve disorder, unspecified: Secondary | ICD-10-CM

## 2014-11-03 ENCOUNTER — Ambulatory Visit (INDEPENDENT_AMBULATORY_CARE_PROVIDER_SITE_OTHER): Payer: Commercial Managed Care - HMO | Admitting: Cardiology

## 2014-11-03 ENCOUNTER — Other Ambulatory Visit: Payer: Self-pay | Admitting: *Deleted

## 2014-11-03 ENCOUNTER — Encounter: Payer: Self-pay | Admitting: Cardiology

## 2014-11-03 ENCOUNTER — Ambulatory Visit (HOSPITAL_COMMUNITY): Payer: Commercial Managed Care - HMO | Attending: Cardiovascular Disease

## 2014-11-03 VITALS — BP 140/82 | HR 66 | Ht 61.0 in | Wt 195.0 lb

## 2014-11-03 DIAGNOSIS — I058 Other rheumatic mitral valve diseases: Secondary | ICD-10-CM | POA: Insufficient documentation

## 2014-11-03 DIAGNOSIS — Z8673 Personal history of transient ischemic attack (TIA), and cerebral infarction without residual deficits: Secondary | ICD-10-CM | POA: Diagnosis not present

## 2014-11-03 DIAGNOSIS — I059 Rheumatic mitral valve disease, unspecified: Secondary | ICD-10-CM

## 2014-11-03 DIAGNOSIS — I10 Essential (primary) hypertension: Secondary | ICD-10-CM | POA: Diagnosis not present

## 2014-11-03 DIAGNOSIS — E119 Type 2 diabetes mellitus without complications: Secondary | ICD-10-CM | POA: Insufficient documentation

## 2014-11-03 DIAGNOSIS — I350 Nonrheumatic aortic (valve) stenosis: Secondary | ICD-10-CM

## 2014-11-03 DIAGNOSIS — I359 Nonrheumatic aortic valve disorder, unspecified: Secondary | ICD-10-CM | POA: Diagnosis not present

## 2014-11-03 DIAGNOSIS — E785 Hyperlipidemia, unspecified: Secondary | ICD-10-CM | POA: Diagnosis not present

## 2014-11-03 NOTE — Progress Notes (Signed)
2D Echo completed. 11/03/2014 

## 2014-11-03 NOTE — Patient Instructions (Signed)
Your physician recommends that you continue on your current medications as directed. Please refer to the Current Medication list given to you today.  Your physician wants you to follow-up in: 6 months with Dr. Skains. You will receive a reminder letter in the mail two months in advance. If you don't receive a letter, please call our office to schedule the follow-up appointment.  

## 2014-11-03 NOTE — Progress Notes (Signed)
[address], [address] [place], [place]  [address] Phone: [phone] Fax:  [phone]  Date:  11/03/2014   ID:  Janet Williamson, DOB January 07, 1941, MRN [id number]  PCP:  Astrid Divine, MD   History of Present Illness: Janet Williamson is a 74 y.o. female with mitral annular calcification/mass of 1.3 cm prior history of embolic CVA in 2008 (Plavix), moderate aortic stenosis here for followup. Cardiac catheterization showed no flow-limiting coronary artery disease in January of 2011. We are monitoring her mass on mitral valve/anulus/ aortic stenosis.  Overall, she is doing quite well without any worsening dyspnea on exertion, no chest pain, no fevers, no chills. No strokelike symptoms that are new.  Echocardiogram as below from 2015. Moderate aortic stenosis was noted. She has a loud musical murmur. She is ambulating well. Continue with her medications. Personally reviewed 2016 echocardiogram which still shows moderate range.    Wt Readings from Last 3 Encounters:  11/03/14 195 lb (88.451 kg)  07/06/14 192 lb (87.091 kg)  04/22/14 188 lb (85.276 kg)     Past Medical History  Diagnosis Date  . Diabetes mellitus   . Hypertension   . Elevated cholesterol   . Cardiomyopathy in other disease   . Mitral valve disorder   . Heart murmur   . Generalized osteoarthritis   . Anemia   . Phlebitis   . Thrombophlebitis   . Stroke   . Cataracts, bilateral   . GERD (gastroesophageal reflux disease)   . Aortic stenosis     moderate AS 10/2012 echo (Dr. Anne Fu)    Past Surgical History  Procedure Laterality Date  . Abdominal hysterectomy    . Eye surgery      cataract removal  . Total knee arthroplasty Right 04/14/2013    Dr August Saucer  . Total knee arthroplasty Right 04/14/2013    Procedure: TOTAL KNEE ARTHROPLASTY;  Surgeon: Cammy Copa, MD;  Location: [facility] OR;  Service: Orthopedics;  Laterality: Right;    Current Outpatient Prescriptions  Medication Sig Dispense  Refill  . amLODipine (NORVASC) 5 MG tablet Take 5 mg by mouth daily.    Marland Kitchen aspirin 81 MG tablet Take 81 mg by mouth daily.    . Calcium Carbonate (CALCIUM 600 PO) Take 1 tablet by mouth daily.    . carboxymethylcellulose (REFRESH PLUS) 0.5 % SOLN Apply 1 drop to eye 2 (two) times daily as needed (for dry eyes).    . cholecalciferol (VITAMIN D) 1000 UNITS tablet Take 1,000 Units by mouth daily.    . clopidogrel (PLAVIX) 75 MG tablet Take 75 mg by mouth daily.    . hydrochlorothiazide (HYDRODIURIL) 25 MG tablet Take 25 mg by mouth daily.    . metFORMIN (GLUCOPHAGE-XR) 750 MG 24 hr tablet TAKE ONE TABLET BY MOUTH ONCE DAILY 30 tablet 3  . metoprolol succinate (TOPROL-XL) 50 MG 24 hr tablet Take 50 mg by mouth daily. Take with or immediately following a meal.    . ONE TOUCH ULTRA TEST test strip 1 each by Other route every other day.     . simvastatin (ZOCOR) 20 MG tablet Take 20 mg by mouth every evening.     No current facility-administered medications for this visit.    Allergies:    Allergies  Allergen Reactions  . Ace Inhibitors     angioedema  . Keflex [Cephalexin]   . Ramipril Swelling  . Rifadin [Rifampin]     Social History:  The patient  reports  that she has never smoked. She has never used smokeless tobacco. She reports that she does not drink alcohol or use illicit drugs.   ROS:  Please see the history of present illness.   No CP, no SOB, no syncope  PHYSICAL EXAM: VS:  BP 140/82 mmHg  Pulse 66  Ht  (1.549 m)  Wt 195 lb (88.451 kg)  BMI 36.86 kg/m2  SpO2 97% Well nourished, well developed, in no acute distress HEENT: normal Neck: no JVDMinimally delayed carotid upstroke Cardiac:  normal S1, S2; RRR; 3/6 harsh musical murmur heard best at right upper sternal border with radiation to the carotid arteries Lungs:  clear to auscultation bilaterally, no wheezing, rhonchi or rales Abd: soft, nontender, no hepatomegaly Ext: no edema Skin: warm and dry Neuro: no focal  abnormalities noted  Labs: 12/14-LDL 82 EKG:  04/22/14: NSR 66, NSSTW changes   ECHO: 11/03/13:  - Left ventricle: The cavity size was normal. There was moderate focal basal and mild concentric hypertrophy. Systolic function was vigorous. The estimated ejection fraction was in the range of 65% to 70%. Wall motion was normal; there were no regional wall motion abnormalities. There was an increased relative contribution of atrial contraction to ventricular filling. Features are consistent with a pseudonormal left ventricular filling pattern, with concomitant abnormal relaxation and increased filling pressure (grade 2 diastolic dysfunction). - Aortic valve: Severe thickening and calcification, consistent with sclerosis. There was moderate stenosis. Mild regurgitation. Mean gradient: 33mm Hg (S). Peak gradient: 52mm Hg (S). - Mitral valve: Calcified annulus. Mildly thickened leaflets . Mild regurgitation. - Left atrium: The atrium was mildly dilated. - Atrial septum: There was increased thickness of the septum, consistent with lipomatous hypertrophy. - Pulmonary arteries: PA peak pressure: 51mm Hg (S). Impressions:  - The right ventricular systolic pressure was increased consistent with mild pulmonary hypertension.    ASSESSMENT AND PLAN:  1. Moderate aortic stenosis- Repeat yearly. Harsh musical murmur. Radiation to the carotids noted. When severe and associated with symptoms, aortic valve replacement. The results of echo will be called in. 2. Mitral valve/annular mass. Monitoring on echocardiogram. Previously did not appear as prominent. 3. Hyperlipidemia-continue with current therapy. LDL excellent 4. Hypertension-currently mildly elevated. Continue to monitor closely. No changes made today. Just at the upper limit of normal. 5. Diabetes-per primary team. Dr. Valentina Lucks. 6. We will see back in 6 months.  Signed, Donato Schultz, MD [facility]  11/03/2014 9:20 AM

## 2014-11-11 ENCOUNTER — Other Ambulatory Visit: Payer: Self-pay | Admitting: Endocrinology

## 2014-11-25 ENCOUNTER — Telehealth: Payer: Self-pay | Admitting: Cardiology

## 2014-11-25 NOTE — Telephone Encounter (Signed)
New message ° ° ° ° ° °Want echo results °

## 2014-11-25 NOTE — Telephone Encounter (Signed)
Reviewed results of echo with patient who stated understanding. 

## 2014-12-30 ENCOUNTER — Other Ambulatory Visit (INDEPENDENT_AMBULATORY_CARE_PROVIDER_SITE_OTHER): Payer: Commercial Managed Care - HMO

## 2014-12-30 DIAGNOSIS — E119 Type 2 diabetes mellitus without complications: Secondary | ICD-10-CM | POA: Diagnosis not present

## 2014-12-30 LAB — URINALYSIS, ROUTINE W REFLEX MICROSCOPIC
Bilirubin Urine: NEGATIVE
HGB URINE DIPSTICK: NEGATIVE
Ketones, ur: NEGATIVE
Nitrite: NEGATIVE
PH: 7.5 (ref 5.0–8.0)
Specific Gravity, Urine: 1.01 (ref 1.000–1.030)
Total Protein, Urine: NEGATIVE
UROBILINOGEN UA: 0.2 (ref 0.0–1.0)
Urine Glucose: NEGATIVE

## 2014-12-30 LAB — COMPREHENSIVE METABOLIC PANEL
ALBUMIN: 3.8 g/dL (ref 3.5–5.2)
ALT: 12 U/L (ref 0–35)
AST: 15 U/L (ref 0–37)
Alkaline Phosphatase: 52 U/L (ref 39–117)
BUN: 15 mg/dL (ref 6–23)
CALCIUM: 10 mg/dL (ref 8.4–10.5)
CHLORIDE: 103 meq/L (ref 96–112)
CO2: 30 meq/L (ref 19–32)
Creatinine, Ser: 0.75 mg/dL (ref 0.40–1.20)
GFR: 97.13 mL/min (ref >60.00)
Glucose, Bld: 114 mg/dL — ABNORMAL HIGH (ref 70–99)
POTASSIUM: 3.8 meq/L (ref 3.5–5.1)
SODIUM: 137 meq/L (ref 135–145)
TOTAL PROTEIN: 8.1 g/dL (ref 6.0–8.3)
Total Bilirubin: 0.4 mg/dL (ref 0.2–1.2)

## 2014-12-30 LAB — HEMOGLOBIN A1C: HEMOGLOBIN A1C: 6.8 % — AB (ref 4.6–6.5)

## 2015-01-04 ENCOUNTER — Ambulatory Visit (INDEPENDENT_AMBULATORY_CARE_PROVIDER_SITE_OTHER): Payer: Commercial Managed Care - HMO | Admitting: Endocrinology

## 2015-01-04 ENCOUNTER — Encounter: Payer: Self-pay | Admitting: Endocrinology

## 2015-01-04 VITALS — BP 136/84 | HR 74 | Temp 97.7°F | Resp 16 | Ht 61.0 in | Wt 200.0 lb

## 2015-01-04 DIAGNOSIS — I10 Essential (primary) hypertension: Secondary | ICD-10-CM

## 2015-01-04 DIAGNOSIS — E042 Nontoxic multinodular goiter: Secondary | ICD-10-CM

## 2015-01-04 DIAGNOSIS — E7800 Pure hypercholesterolemia, unspecified: Secondary | ICD-10-CM

## 2015-01-04 DIAGNOSIS — E119 Type 2 diabetes mellitus without complications: Secondary | ICD-10-CM | POA: Diagnosis not present

## 2015-01-04 NOTE — Progress Notes (Signed)
Patient ID: Janet Williamson, female   DOB: [birth year], 74 y.o.   MRN: [id number]   Reason for Appointment: Diabetes follow-up   History of Present Illness   Type 2 DIABETES MELITUS, date of diagnosis 02/2012     She has had mild diabetes which has been well controlled with metformin ER alone. Has had no side effects from metformin is taking only 1000 mg a day  She also has had diabetes education in 2013  She is checking her blood sugars periodically at home but mostly in the evenings, not clear which readings are after meals; has a couple of readings in the afternoon also Again most of her sugars are looking excellent but her A1c is slightly higher at 6.8 Previous A1c levels had been ranging from 6.1-6.4 She has again gained some more weight and not clear why she is progressively gaining weight Usually unable to exercise much  Oral hypoglycemic drugs: Metformin 1g        Side effects from medications: None Monitors blood glucose:  0.4 times a day     Glucometer: One Touch.          Blood Glucose readings from download as follows:  Midday: 119, 127, around 6-7 PM = 91-160 with overall average 112  Hypoglycemia frequency: Never.          Meals: 3 meals per day. Dinner 5 pm      Physical activity: exercise: none, back pain         Wt Readings from Last 3 Encounters:  01/04/15 200 lb (90.719 kg)  11/03/14 195 lb (88.451 kg)  07/06/14 192 lb (87.091 kg)   No history of diabetic complications Last eye exam: 10/22/13, goes annually   Lab Results  Component Value Date   HGBA1C 6.8* 12/30/2014   HGBA1C 6.6* 06/04/2014   HGBA1C 6.7* 01/13/2014   Lab Results  Component Value Date   MICROALBUR 0.1 06/04/2014   LDLCALC 74 01/13/2014   CREATININE 0.75 12/30/2014     Lab on 12/30/2014  Component Date Value Ref Range Status  . Hgb A1c MFr Bld 12/30/2014 6.8* 4.6 - 6.5 % Final   Glycemic Control Guidelines for People with Diabetes:Non Diabetic:  <6%Goal of Therapy: <7%Additional  Action Suggested:  >8%   . Sodium 12/30/2014 137  135 - 145 mEq/L Final  . Potassium 12/30/2014 3.8  3.5 - 5.1 mEq/L Final  . Chloride 12/30/2014 103  96 - 112 mEq/L Final  . CO2 12/30/2014 30  19 - 32 mEq/L Final  . Glucose, Bld 12/30/2014 114* 70 - 99 mg/dL Final  . BUN 40/98/1191 15  6 - 23 mg/dL Final  . Creatinine, Ser 12/30/2014 0.75  0.40 - 1.20 mg/dL Final  . Total Bilirubin 12/30/2014 0.4  0.2 - 1.2 mg/dL Final  . Alkaline Phosphatase 12/30/2014 52  39 - 117 U/L Final  . AST 12/30/2014 15  0 - 37 U/L Final  . ALT 12/30/2014 12  0 - 35 U/L Final  . Total Protein 12/30/2014 8.1  6.0 - 8.3 g/dL Final  . Albumin 47/82/9562 3.8  3.5 - 5.2 g/dL Final  . Calcium 13/05/6577 10.0  8.4 - 10.5 mg/dL Final  . GFR 46/96/2952 97.13  >60.00 mL/min Final  . Color, Urine 12/30/2014 YELLOW  Yellow;Lt. Yellow Final  . APPearance 12/30/2014 CLEAR  Clear Final  . Specific Gravity, Urine 12/30/2014 1.010  1.000-1.030 Final  . pH 12/30/2014 7.5  5.0 - 8.0 Final  . Total Protein, Urine 12/30/2014 NEGATIVE  Negative Final  . Urine Glucose 12/30/2014 NEGATIVE  Negative Final  . Ketones, ur 12/30/2014 NEGATIVE  Negative Final  . Bilirubin Urine 12/30/2014 NEGATIVE  Negative Final  . Hgb urine dipstick 12/30/2014 NEGATIVE  Negative Final  . Urobilinogen, UA 12/30/2014 0.2  0.0 - 1.0 Final  . Leukocytes, UA 12/30/2014 MODERATE* Negative Final  . Nitrite 12/30/2014 NEGATIVE  Negative Final  . WBC, UA 12/30/2014 3-6/hpf* 0-2/hpf Final   Results faxed to site/floor on 12/30/2014 11:09 AM by Luellen Pucker.  . RBC / HPF 12/30/2014 0-2/hpf  0-2/hpf Final  . Squamous Epithelial / LPF 12/30/2014 Few(5-10/hpf)* Rare(0-4/hpf) Final   transitional epith present  . Bacteria, UA 12/30/2014 Few(10-50/hpf)* None Final      Medication List       This list is accurate as of: 01/04/15 11:12 AM.  Always use your most recent med list.               amLODipine 5 MG tablet  Commonly known as:  NORVASC  Take 5  mg by mouth daily.     aspirin 81 MG tablet  Take 81 mg by mouth daily.     CALCIUM 600 PO  Take 1 tablet by mouth daily.     carboxymethylcellulose 0.5 % Soln  Commonly known as:  REFRESH PLUS  Apply 1 drop to eye 2 (two) times daily as needed (for dry eyes).     cholecalciferol 1000 UNITS tablet  Commonly known as:  VITAMIN D  Take 1,000 Units by mouth daily.     clopidogrel 75 MG tablet  Commonly known as:  PLAVIX  Take 75 mg by mouth daily.     hydrochlorothiazide 25 MG tablet  Commonly known as:  HYDRODIURIL  Take 25 mg by mouth daily.     metFORMIN 750 MG 24 hr tablet  Commonly known as:  GLUCOPHAGE-XR  TAKE ONE TABLET BY MOUTH ONCE DAILY     metoprolol succinate 50 MG 24 hr tablet  Commonly known as:  TOPROL-XL  Take 50 mg by mouth daily. Take with or immediately following a meal.     ONE TOUCH ULTRA TEST test strip  Generic drug:  glucose blood  1 each by Other route every other day.     simvastatin 20 MG tablet  Commonly known as:  ZOCOR  Take 20 mg by mouth every evening.        Allergies:  Allergies  Allergen Reactions  . Ace Inhibitors     angioedema  . Keflex [Cephalexin]   . Ramipril Swelling  . Rifadin [Rifampin]     Past Medical History  Diagnosis Date  . Diabetes mellitus   . Hypertension   . Elevated cholesterol   . Cardiomyopathy in other disease   . Mitral valve disorder   . Heart murmur   . Generalized osteoarthritis   . Anemia   . Phlebitis   . Thrombophlebitis   . Stroke   . Cataracts, bilateral   . GERD (gastroesophageal reflux disease)   . Aortic stenosis     moderate AS 10/2012 echo (Dr. Anne Fu)    Past Surgical History  Procedure Laterality Date  . Abdominal hysterectomy    . Eye surgery      cataract removal  . Total knee arthroplasty Right 04/14/2013    Dr August Saucer  . Total knee arthroplasty Right 04/14/2013    Procedure: TOTAL KNEE ARTHROPLASTY;  Surgeon: Cammy Copa, MD;  Location: [facility] OR;  Service:  Orthopedics;  Laterality:  Right;    Family History  Problem Relation Age of Onset  . Heart attack Neg Hx   . Stroke Mother   . Hypertension Mother   . Hypertension Father     Social History:  reports that she has never smoked. She has never used smokeless tobacco. She reports that she does not drink alcohol or use illicit drugs.  Review of Systems:  HYPERTENSION:  she has been managed with a 3 drug regimen, followed by PCP.  No pedal edema  HYPERLIPIDEMIA: The lipid abnormality consists of elevated LDL treated with simvastatin and LDL is controlled  Lab Results  Component Value Date   CHOL 133 01/13/2014   HDL 44.40 01/13/2014   LDLCALC 74 01/13/2014   TRIG 73.0 01/13/2014   CHOLHDL 3 01/13/2014    Has history of anemia followed by PCP  Multinodular goiter: She has had a long-standing multinodular goiter, last ultrasound was done in 05/2012 which showed a 4.1 cm nodule in the isthmus and other nodules Does not have any local pressure symptoms like choking or difficulty swallowing. Has not had any thyroid dysfunction previously  Lab Results  Component Value Date   TSH 1.52 01/13/2014    No history of numbness or tingling in her feet Foot exam done in 3/16  She thinks she is having a little more swelling in her feet  Also complaining about consistent back pain   Examination:   BP 136/84 mmHg  Pulse 74  Temp(Src) 97.7 F (36.5 C)  Resp 16  Ht  (1.549 m)  Wt 200 lb (90.719 kg)  BMI 37.81 kg/m2  SpO2 97%  Body mass index is 37.81 kg/(m^2).   Diabetic foot exam shows normal monofilament sensation in the toes and plantar surfaces, no skin lesions or ulcers on the feet and normal pedal pulses except dorsalis pedis which are less prominent  THYROID: Her thyroid is nodular, about 2-1/2-3 times normal on the right side and twice normal on the left.  She has about a 4 cm smooth and firm nodule in the midline.  Pemberton sign negative  1+ pedal edema  present  ASSESSMENT/ PLAN:   Diabetes type 2   The patient's diabetes control appears to be fairly good with A1c 6.8 on metformin alone This is reasonable considering her multiple medical problems and age Also her blood sugars are excellent at home and also glucose was fairly good in the lab Discussed that we need to have her work harder on weight loss with better diet and increasing activity as tolerated as her weight continues to creep up She will be referred to the dietitian for meal planning to help her with weight loss also as she has not had a dietitian consultation in quite some time  Consider increasing metformin or adding Januvia if blood sugars are higher Followup in 6 months again   Exam shows no neuropathy No evidence of nephropathy Discussed regular eye exams  Hypertension: Fairly well controlled with metoprolol, Norvasc and HCTZ, followed by PCP  MULTINODULAR goiter: Clinically she appears to be similar to previous exams and her larger nodule in the midline is about the same as indicated on her previous ultrasound.  Will not need any further ultrasound exams and will follow clinically Will check TSH on her next visit  Hyperlipidemia: Followed by PCP, on Zocor.  Advised her to follow-up with PCP for other problems and questions she has including recent difficulties with edema   Patient Instructions  Please check blood sugars  at least half the time about 2 hours after any meal and 3 times per week on waking up. Please bring blood sugar monitor to each visit. Recommended blood sugar levels about 2 hours after meal is 140-180 and on waking up 90-130  Walk daily  Counseling time over 50% of today's 25 minute visit  Aldene Hendon 01/04/2015, 11:12 AM

## 2015-01-04 NOTE — Patient Instructions (Signed)
Please check blood sugars at least half the time about 2 hours after any meal and 3 times per week on waking up. Please bring blood sugar monitor to each visit. Recommended blood sugar levels about 2 hours after meal is 140-180 and on waking up 90-130  Walk daily   

## 2015-02-16 ENCOUNTER — Other Ambulatory Visit: Payer: Self-pay | Admitting: Endocrinology

## 2015-04-04 ENCOUNTER — Other Ambulatory Visit: Payer: Self-pay

## 2015-04-04 DIAGNOSIS — Z0000 Encounter for general adult medical examination without abnormal findings: Secondary | ICD-10-CM | POA: Diagnosis not present

## 2015-04-04 DIAGNOSIS — I10 Essential (primary) hypertension: Secondary | ICD-10-CM | POA: Diagnosis not present

## 2015-04-04 DIAGNOSIS — E042 Nontoxic multinodular goiter: Secondary | ICD-10-CM | POA: Diagnosis not present

## 2015-04-04 DIAGNOSIS — E118 Type 2 diabetes mellitus with unspecified complications: Secondary | ICD-10-CM | POA: Diagnosis not present

## 2015-04-04 DIAGNOSIS — G4700 Insomnia, unspecified: Secondary | ICD-10-CM | POA: Diagnosis not present

## 2015-04-04 DIAGNOSIS — M1990 Unspecified osteoarthritis, unspecified site: Secondary | ICD-10-CM | POA: Diagnosis not present

## 2015-04-04 DIAGNOSIS — E785 Hyperlipidemia, unspecified: Secondary | ICD-10-CM | POA: Diagnosis not present

## 2015-04-04 DIAGNOSIS — M8580 Other specified disorders of bone density and structure, unspecified site: Secondary | ICD-10-CM | POA: Diagnosis not present

## 2015-04-08 DIAGNOSIS — Z1231 Encounter for screening mammogram for malignant neoplasm of breast: Secondary | ICD-10-CM | POA: Diagnosis not present

## 2015-04-15 DIAGNOSIS — N6002 Solitary cyst of left breast: Secondary | ICD-10-CM | POA: Diagnosis not present

## 2015-06-21 DIAGNOSIS — E119 Type 2 diabetes mellitus without complications: Secondary | ICD-10-CM | POA: Diagnosis not present

## 2015-06-21 DIAGNOSIS — H04123 Dry eye syndrome of bilateral lacrimal glands: Secondary | ICD-10-CM | POA: Diagnosis not present

## 2015-06-21 DIAGNOSIS — H35033 Hypertensive retinopathy, bilateral: Secondary | ICD-10-CM | POA: Diagnosis not present

## 2015-06-21 DIAGNOSIS — Z961 Presence of intraocular lens: Secondary | ICD-10-CM | POA: Diagnosis not present

## 2015-06-21 DIAGNOSIS — H524 Presbyopia: Secondary | ICD-10-CM | POA: Diagnosis not present

## 2015-07-04 ENCOUNTER — Other Ambulatory Visit (INDEPENDENT_AMBULATORY_CARE_PROVIDER_SITE_OTHER): Payer: Commercial Managed Care - HMO

## 2015-07-04 DIAGNOSIS — E119 Type 2 diabetes mellitus without complications: Secondary | ICD-10-CM | POA: Diagnosis not present

## 2015-07-04 LAB — BASIC METABOLIC PANEL
BUN: 15 mg/dL (ref 6–23)
CO2: 32 mEq/L (ref 19–32)
Calcium: 10.1 mg/dL (ref 8.4–10.5)
Chloride: 100 mEq/L (ref 96–112)
Creatinine, Ser: 0.76 mg/dL (ref 0.40–1.20)
GFR: 95.53 mL/min (ref >60.00)
GLUCOSE: 116 mg/dL — AB (ref 70–99)
Potassium: 3.7 mEq/L (ref 3.5–5.1)
Sodium: 138 mEq/L (ref 135–145)

## 2015-07-04 LAB — HEMOGLOBIN A1C: Hgb A1c MFr Bld: 6.7 % — ABNORMAL HIGH (ref 4.6–6.5)

## 2015-07-07 ENCOUNTER — Encounter: Payer: Self-pay | Admitting: Endocrinology

## 2015-07-07 ENCOUNTER — Ambulatory Visit (INDEPENDENT_AMBULATORY_CARE_PROVIDER_SITE_OTHER): Payer: Commercial Managed Care - HMO | Admitting: Endocrinology

## 2015-07-07 VITALS — BP 128/82 | HR 71 | Temp 98.2°F | Resp 16 | Ht 61.0 in | Wt 200.6 lb

## 2015-07-07 DIAGNOSIS — Z23 Encounter for immunization: Secondary | ICD-10-CM

## 2015-07-07 DIAGNOSIS — E042 Nontoxic multinodular goiter: Secondary | ICD-10-CM

## 2015-07-07 DIAGNOSIS — E119 Type 2 diabetes mellitus without complications: Secondary | ICD-10-CM | POA: Diagnosis not present

## 2015-07-07 NOTE — Progress Notes (Signed)
Patient ID: Janet Williamson, female   DOB: [birth year], 74 y.o.   MRN: [id number]   Reason for Appointment: Diabetes follow-up   History of Present Illness   Type 2 DIABETES MELITUS, date of diagnosis 02/2012     She has had mild diabetes which has been well controlled with metformin ER alone. Has had no side effects from metformin is taking only 1000 mg a day  She also has had diabetes education in 2013  She is checking her blood sugars periodically at home but again only in the evenings, mostly after meals Again most of her sugars are looking excellent but her A1c is slightly higher than expected at 6.7 She has tried to do a little walking now and her weight has leveled off  Oral hypoglycemic drugs: Metformin 1g        Side effects from medications: None Monitors blood glucose:  0.4 times a day     Glucometer: One Touch.          Blood Glucose readings from download as follows:  Range 89-148, checking only in the evenings usually, overall average 127  Hypoglycemia frequency: Never.          Meals: 3 meals per day. Dinner 5 pm      Physical activity: exercise: walking 30 min 2/7        Wt Readings from Last 3 Encounters:  07/07/15 200 lb 9.6 oz (90.992 kg)  01/04/15 200 lb (90.719 kg)  11/03/14 195 lb (88.451 kg)   No history of diabetic complications Last eye exam: 10/22/13, goes annually   Lab Results  Component Value Date   HGBA1C 6.7* 07/04/2015   HGBA1C 6.8* 12/30/2014   HGBA1C 6.6* 06/04/2014   Lab Results  Component Value Date   MICROALBUR 0.1 06/04/2014   LDLCALC 74 01/13/2014   CREATININE 0.76 07/04/2015   Microalbumin/creatinine ratio 6.5 done on 04/04/15  LABS:  Appointment on 07/04/2015  Component Date Value Ref Range Status  . Hgb A1c MFr Bld 07/04/2015 6.7* 4.6 - 6.5 % Final   Glycemic Control Guidelines for People with Diabetes:Non Diabetic:  <6%Goal of Therapy: <7%Additional Action Suggested:  >8%   . Sodium 07/04/2015 138  135 - 145 mEq/L Final  .  Potassium 07/04/2015 3.7  3.5 - 5.1 mEq/L Final  . Chloride 07/04/2015 100  96 - 112 mEq/L Final  . CO2 07/04/2015 32  19 - 32 mEq/L Final  . Glucose, Bld 07/04/2015 116* 70 - 99 mg/dL Final  . BUN 40/98/1191 15  6 - 23 mg/dL Final  . Creatinine, Ser 07/04/2015 0.76  0.40 - 1.20 mg/dL Final  . Calcium 47/82/9562 10.1  8.4 - 10.5 mg/dL Final  . GFR 13/05/6577 95.53  >60.00 mL/min Final      Medication List       This list is accurate as of: 07/07/15  1:12 PM.  Always use your most recent med list.               amLODipine 5 MG tablet  Commonly known as:  NORVASC  Take 5 mg by mouth daily.     aspirin 81 MG tablet  Take 81 mg by mouth daily.     CALCIUM 600 PO  Take 1 tablet by mouth daily.     carboxymethylcellulose 0.5 % Soln  Commonly known as:  REFRESH PLUS  Apply 1 drop to eye 2 (two) times daily as needed (for dry eyes).     cholecalciferol 1000 UNITS tablet  Commonly known as:  VITAMIN D  Take 1,000 Units by mouth daily.     clopidogrel 75 MG tablet  Commonly known as:  PLAVIX  Take 75 mg by mouth daily.     hydrochlorothiazide 25 MG tablet  Commonly known as:  HYDRODIURIL  Take 25 mg by mouth daily.     metFORMIN 750 MG 24 hr tablet  Commonly known as:  GLUCOPHAGE-XR  TAKE ONE TABLET BY MOUTH ONCE DAILY     metoprolol succinate 50 MG 24 hr tablet  Commonly known as:  TOPROL-XL  Take 50 mg by mouth daily. Take with or immediately following a meal.     ONE TOUCH ULTRA TEST test strip  Generic drug:  glucose blood  1 each by Other route every other day.     simvastatin 20 MG tablet  Commonly known as:  ZOCOR  Take 20 mg by mouth every evening.        Allergies:  Allergies  Allergen Reactions  . Ace Inhibitors     angioedema  . Keflex [Cephalexin]   . Ramipril Swelling  . Rifadin [Rifampin]     Past Medical History  Diagnosis Date  . Diabetes mellitus   . Hypertension   . Elevated cholesterol   . Cardiomyopathy in other disease   .  Mitral valve disorder   . Heart murmur   . Generalized osteoarthritis   . Anemia   . Phlebitis   . Thrombophlebitis   . Stroke   . Cataracts, bilateral   . GERD (gastroesophageal reflux disease)   . Aortic stenosis     moderate AS 10/2012 echo (Dr. Anne Fu)    Past Surgical History  Procedure Laterality Date  . Abdominal hysterectomy    . Eye surgery      cataract removal  . Total knee arthroplasty Right 04/14/2013    Dr August Saucer  . Total knee arthroplasty Right 04/14/2013    Procedure: TOTAL KNEE ARTHROPLASTY;  Surgeon: Cammy Copa, MD;  Location: [facility] OR;  Service: Orthopedics;  Laterality: Right;    Family History  Problem Relation Age of Onset  . Heart attack Neg Hx   . Stroke Mother   . Hypertension Mother   . Hypertension Father     Social History:  reports that she has never smoked. She has never used smokeless tobacco. She reports that she does not drink alcohol or use illicit drugs.  Review of Systems:  HYPERTENSION:  she has been managed with a 3 drug regimen, followed by PCP.   HYPERLIPIDEMIA: The lipid abnormality consists of elevated LDL treated with simvastatin and LDL is controlled  Lab Results  Component Value Date   CHOL 133 01/13/2014   HDL 44.40 01/13/2014   LDLCALC 74 01/13/2014   TRIG 73.0 01/13/2014   CHOLHDL 3 01/13/2014    Has history of anemia followed by PCP  Multinodular goiter: She has had a long-standing multinodular goiter, last ultrasound was done in 05/2012 which showed a 4.1 cm nodule in the isthmus and other nodules Does not have any local pressure symptoms like choking or difficulty swallowing. Has not had any thyroid dysfunction previously Her last exam was stable  Lab Results  Component Value Date   TSH 1.52 01/13/2014    No history of numbness or tingling in her feet Foot exam done in 3/16  Again asking about little swelling in her feet especially when she is sitting for long    Examination:   BP 128/82 mmHg  Pulse 71  Temp(Src) 98.2 F (36.8 C)  Resp 16  Ht  (1.549 m)  Wt 200 lb 9.6 oz (90.992 kg)  BMI 37.92 kg/m2  SpO2 97%  Body mass index is 37.92 kg/(m^2).    Trace pedal edema present  ASSESSMENT/ PLAN:   Diabetes type 2   The patient's diabetes control appears to be fairly good with A1c 6.7 on metformin alone This is reasonable considering her multiple medical problems and age Also her blood sugars are near-normal after eating her evening meal She has started walking lately and hopefully can start losing weight also No change in medication needed at this time  Hypertension: Fairly well controlled with metoprolol, Norvasc and HCTZ, followed by PCP  Advised elastic stockings for edema  Influenza vaccine given  [facility] 07/07/2015, 1:12 PM

## 2015-08-23 ENCOUNTER — Other Ambulatory Visit: Payer: Self-pay | Admitting: Endocrinology

## 2015-09-29 ENCOUNTER — Other Ambulatory Visit: Payer: Self-pay | Admitting: *Deleted

## 2015-09-29 MED ORDER — METFORMIN HCL ER 750 MG PO TB24: 750.0000 mg | ORAL_TABLET | Freq: Every day | ORAL | Status: DC

## 2015-09-30 DIAGNOSIS — I10 Essential (primary) hypertension: Secondary | ICD-10-CM | POA: Diagnosis not present

## 2015-09-30 DIAGNOSIS — E118 Type 2 diabetes mellitus with unspecified complications: Secondary | ICD-10-CM | POA: Diagnosis not present

## 2015-09-30 DIAGNOSIS — Z7984 Long term (current) use of oral hypoglycemic drugs: Secondary | ICD-10-CM | POA: Diagnosis not present

## 2015-09-30 DIAGNOSIS — E785 Hyperlipidemia, unspecified: Secondary | ICD-10-CM | POA: Diagnosis not present

## 2015-10-04 ENCOUNTER — Other Ambulatory Visit: Payer: Self-pay | Admitting: *Deleted

## 2015-10-04 MED ORDER — METFORMIN HCL ER 750 MG PO TB24: 750.0000 mg | ORAL_TABLET | Freq: Every day | ORAL | Status: DC

## 2015-12-29 ENCOUNTER — Other Ambulatory Visit (INDEPENDENT_AMBULATORY_CARE_PROVIDER_SITE_OTHER): Payer: Commercial Managed Care - HMO

## 2015-12-29 DIAGNOSIS — E119 Type 2 diabetes mellitus without complications: Secondary | ICD-10-CM | POA: Diagnosis not present

## 2015-12-29 LAB — COMPREHENSIVE METABOLIC PANEL
ALBUMIN: 3.9 g/dL (ref 3.5–5.2)
ALT: 11 U/L (ref 0–35)
AST: 14 U/L (ref 0–37)
Alkaline Phosphatase: 54 U/L (ref 39–117)
BUN: 22 mg/dL (ref 6–23)
CHLORIDE: 101 meq/L (ref 96–112)
CO2: 30 mEq/L (ref 19–32)
Calcium: 10.5 mg/dL (ref 8.4–10.5)
Creatinine, Ser: 0.7 mg/dL (ref 0.40–1.20)
GFR: 104.9 mL/min (ref >60.00)
Glucose, Bld: 115 mg/dL — ABNORMAL HIGH (ref 70–99)
POTASSIUM: 3.7 meq/L (ref 3.5–5.1)
SODIUM: 138 meq/L (ref 135–145)
Total Bilirubin: 0.3 mg/dL (ref 0.2–1.2)
Total Protein: 8.3 g/dL (ref 6.0–8.3)

## 2015-12-29 LAB — HEMOGLOBIN A1C: HEMOGLOBIN A1C: 7.1 % — AB (ref 4.6–6.5)

## 2016-01-03 ENCOUNTER — Ambulatory Visit (INDEPENDENT_AMBULATORY_CARE_PROVIDER_SITE_OTHER): Payer: Commercial Managed Care - HMO | Admitting: Endocrinology

## 2016-01-03 ENCOUNTER — Encounter: Payer: Self-pay | Admitting: Endocrinology

## 2016-01-03 VITALS — BP 132/70 | HR 72 | Temp 97.7°F | Resp 14 | Ht 61.0 in | Wt 201.4 lb

## 2016-01-03 DIAGNOSIS — E042 Nontoxic multinodular goiter: Secondary | ICD-10-CM

## 2016-01-03 DIAGNOSIS — E7800 Pure hypercholesterolemia, unspecified: Secondary | ICD-10-CM | POA: Diagnosis not present

## 2016-01-03 DIAGNOSIS — E1165 Type 2 diabetes mellitus with hyperglycemia: Secondary | ICD-10-CM

## 2016-01-03 MED ORDER — METFORMIN HCL ER 750 MG PO TB24: 1500.0000 mg | ORAL_TABLET | Freq: Every day | ORAL | Status: DC

## 2016-01-03 NOTE — Patient Instructions (Signed)
2 Metformin daily  Check blood sugars on waking up  1-2 times a week Also check blood sugars about 2 hours after a meal and do this after different meals by rotation  Recommended blood sugar levels on waking up is 90-130 and about 2 hours after meal is 130-160  Please bring your blood sugar monitor to each visit, thank you

## 2016-01-03 NOTE — Progress Notes (Signed)
Patient ID: Janet Williamson, female   DOB: 07-12-1941, 75 y.o.   MRN: [id number]   Reason for Appointment: Diabetes follow-up   History of Present Illness   Type 2 DIABETES MELITUS, date of diagnosis 02/2012     She has had mild diabetes which has been well controlled with metformin ER alone. Has had no side effects from metformin, previously was  taking only 1000 mg a day and since 2015 only 750 mg daily She also has had diabetes education in 2013  She is checking her blood sugars periodically at home Again most of her sugars are looking excellent with only one relatively high reading in the afternoon However her A1c is slightly higher than usual at 7.1 She has not been able to do any walking because of shortness of breath Weight is stable She thinks her meals and portions are fairly controlled  Oral hypoglycemic drugs: Metformin 0.75 g        Side effects from medications: None Monitors blood glucose:  0.4 times a day     Glucometer: One Touch.          Blood Glucose readings from download as follows:  Mean values apply above for all meters except median for One Touch  PRE-MEAL Fasting Lunch Dinner Bedtime Overall  Glucose range: ?   144      Mean/median:     110    POST-MEAL PC Breakfast PC Lunch PC Dinner  Glucose range:  107-172  93-118   Mean/median:  135              Meals: 3 meals per day. Dinner 5-6 pm       Physical activity: exercise: not walking recently       Wt Readings from Last 3 Encounters:  01/03/16 201 lb 6.4 oz (91.354 kg)  07/07/15 200 lb 9.6 oz (90.992 kg)  01/04/15 200 lb (90.719 kg)     Lab Results  Component Value Date   HGBA1C 7.1* 12/29/2015   HGBA1C 6.7* 07/04/2015   HGBA1C 6.8* 12/30/2014   Lab Results  Component Value Date   MICROALBUR 0.1 06/04/2014   LDLCALC 74 01/13/2014   CREATININE 0.70 12/29/2015   Microalbumin/creatinine ratio 6.5 done on 04/04/15  LABS:  Lab on 12/29/2015  Component Date Value Ref Range Status  . Hgb  A1c MFr Bld 12/29/2015 7.1* 4.6 - 6.5 % Final   Glycemic Control Guidelines for People with Diabetes:Non Diabetic:  <6%Goal of Therapy: <7%Additional Action Suggested:  >8%   . Sodium 12/29/2015 138  135 - 145 mEq/L Final  . Potassium 12/29/2015 3.7  3.5 - 5.1 mEq/L Final  . Chloride 12/29/2015 101  96 - 112 mEq/L Final  . CO2 12/29/2015 30  19 - 32 mEq/L Final  . Glucose, Bld 12/29/2015 115* 70 - 99 mg/dL Final  . BUN 12/29/2015 22  6 - 23 mg/dL Final  . Creatinine, Ser 12/29/2015 0.70  0.40 - 1.20 mg/dL Final  . Total Bilirubin 12/29/2015 0.3  0.2 - 1.2 mg/dL Final  . Alkaline Phosphatase 12/29/2015 54  39 - 117 U/L Final  . AST 12/29/2015 14  0 - 37 U/L Final  . ALT 12/29/2015 11  0 - 35 U/L Final  . Total Protein 12/29/2015 8.3  6.0 - 8.3 g/dL Final  . Albumin 12/29/2015 3.9  3.5 - 5.2 g/dL Final  . Calcium 12/29/2015 10.5  8.4 - 10.5 mg/dL Final  . GFR 12/29/2015 104.90  >60.00 mL/min Final  Medication List       This list is accurate as of: 01/03/16  8:55 PM.  Always use your most recent med list.               ACCU-CHEK AVIVA PLUS w/Device Kit     ACCU-CHEK SOFTCLIX LANCETS lancets     amLODipine 5 MG tablet  Commonly known as:  NORVASC  Take 5 mg by mouth daily.     aspirin 81 MG tablet  Take 81 mg by mouth daily.     CALCIUM 600 PO  Take 1 tablet by mouth daily.     carboxymethylcellulose 0.5 % Soln  Commonly known as:  REFRESH PLUS  Apply 1 drop to eye 2 (two) times daily as needed (for dry eyes).     cholecalciferol 1000 units tablet  Commonly known as:  VITAMIN D  Take 1,000 Units by mouth daily.     clopidogrel 75 MG tablet  Commonly known as:  PLAVIX  Take 75 mg by mouth daily.     GNP ALCOHOL SWABS 70 % Pads     hydrochlorothiazide 25 MG tablet  Commonly known as:  HYDRODIURIL  Take 25 mg by mouth daily.     metFORMIN 750 MG 24 hr tablet  Commonly known as:  GLUCOPHAGE-XR  Take 2 tablets (1,500 mg total) by mouth daily.     metoprolol  succinate 50 MG 24 hr tablet  Commonly known as:  TOPROL-XL  Take 50 mg by mouth daily. Take with or immediately following a meal.     ONE TOUCH ULTRA TEST test strip  Generic drug:  glucose blood  1 each by Other route every other day.     simvastatin 20 MG tablet  Commonly known as:  ZOCOR  Take 20 mg by mouth every evening.        Allergies:  Allergies  Allergen Reactions  . Ace Inhibitors     angioedema  . Keflex [Cephalexin]   . Ramipril Swelling  . Rifadin [Rifampin]     Past Medical History  Diagnosis Date  . Diabetes mellitus   . Hypertension   . Elevated cholesterol   . Cardiomyopathy in other disease   . Mitral valve disorder   . Heart murmur   . Generalized osteoarthritis   . Anemia   . Phlebitis   . Thrombophlebitis   . Stroke ([place])   . Cataracts, bilateral   . GERD (gastroesophageal reflux disease)   . Aortic stenosis     moderate AS 10/2012 echo (Dr. Marlou Porch)    Past Surgical History  Procedure Laterality Date  . Abdominal hysterectomy    . Eye surgery      cataract removal  . Total knee arthroplasty Right 04/14/2013    Dr Marlou Sa  . Total knee arthroplasty Right 04/14/2013    Procedure: TOTAL KNEE ARTHROPLASTY;  Surgeon: Meredith Pel, MD;  Location: [facility];  Service: Orthopedics;  Laterality: Right;    Family History  Problem Relation Age of Onset  . Heart attack Neg Hx   . Stroke Mother   . Hypertension Mother   . Hypertension Father     Social History:  reports that she has never smoked. She has never used smokeless tobacco. She reports that she does not drink alcohol or use illicit drugs.  Review of Systems:  HYPERTENSION:  she has been managed with a 3 drug regimen, followed by PCP.   HYPERLIPIDEMIA: The lipid abnormality consists of elevated LDL treated with  simvastatin by PCP and LDL is controlled Last LDL was 87 in 12/16  Lab Results  Component Value Date   CHOL 133 01/13/2014   HDL 44.40 01/13/2014   LDLCALC 74  01/13/2014   TRIG 73.0 01/13/2014   CHOLHDL 3 01/13/2014     Multinodular goiter: She has had a long-standing multinodular goiter, last ultrasound was done in 05/2012 which showed a 4.1 cm nodule in the isthmus and other nodules Has not had any thyroid dysfunction  Her last exam was stable  Lab Results  Component Value Date   TSH 1.52 01/13/2014    No history of numbness or tingling in her feet Foot exam done in 12 /16 by her PCP     Examination: BP 132/70 mmHg  Pulse 72  Temp(Src) 97.7 F (36.5 C)  Resp 14  Ht _0  (1.549 m)  Wt 201 lb 6.4 oz (91.354 kg)  BMI 38.07 kg/m2  SpO2 97%  Body mass index is 38.07 kg/(m^2).     ASSESSMENT/ PLAN:   Diabetes type 2   The patient's diabetes control appears to be somewhat worse with A1c 7.1, usually around 6.5-6.7 She has not been over to walk as usual which was helping her previously Diet is still consistent  Since she is taking only 750 mg daily of metformin ER will  increase it to 1500 mg daily, has normal renal function She will periodically monitor her blood sugar and let us know if unusually high She is going to be validated by cardiologist  for dyspnea and may be able to start walking again  Hypertension: Fairly well controlled with metoprolol, Norvasc and HCTZ, followed by PCP     [facility] 01/03/2016, 8:55 PM

## 2016-04-03 ENCOUNTER — Other Ambulatory Visit (INDEPENDENT_AMBULATORY_CARE_PROVIDER_SITE_OTHER): Payer: Commercial Managed Care - HMO

## 2016-04-03 DIAGNOSIS — E042 Nontoxic multinodular goiter: Secondary | ICD-10-CM | POA: Diagnosis not present

## 2016-04-03 DIAGNOSIS — E1165 Type 2 diabetes mellitus with hyperglycemia: Secondary | ICD-10-CM | POA: Diagnosis not present

## 2016-04-03 DIAGNOSIS — E7800 Pure hypercholesterolemia, unspecified: Secondary | ICD-10-CM

## 2016-04-03 LAB — COMPREHENSIVE METABOLIC PANEL
ALBUMIN: 3.9 g/dL (ref 3.5–5.2)
ALT: 6 U/L (ref 0–35)
AST: 9 U/L (ref 0–37)
Alkaline Phosphatase: 57 U/L (ref 39–117)
BILIRUBIN TOTAL: 0.4 mg/dL (ref 0.2–1.2)
BUN: 14 mg/dL (ref 6–23)
CALCIUM: 9.9 mg/dL (ref 8.4–10.5)
CO2: 35 meq/L — AB (ref 19–32)
CREATININE: 0.73 mg/dL (ref 0.40–1.20)
Chloride: 100 mEq/L (ref 96–112)
GFR: 99.87 mL/min (ref >60.00)
Glucose, Bld: 103 mg/dL — ABNORMAL HIGH (ref 70–99)
Potassium: 3.4 mEq/L — ABNORMAL LOW (ref 3.5–5.1)
Sodium: 137 mEq/L (ref 135–145)
Total Protein: 8.3 g/dL (ref 6.0–8.3)

## 2016-04-03 LAB — T4, FREE: FREE T4: 0.93 ng/dL (ref 0.60–1.60)

## 2016-04-03 LAB — MICROALBUMIN / CREATININE URINE RATIO
CREATININE, U: 178.5 mg/dL
MICROALB/CREAT RATIO: 0.5 mg/g (ref 0.0–30.0)
Microalb, Ur: 0.9 mg/dL (ref 0.0–1.9)

## 2016-04-03 LAB — TSH: TSH: 1.61 u[IU]/mL (ref 0.35–4.50)

## 2016-04-03 LAB — LIPID PANEL
CHOL/HDL RATIO: 3
Cholesterol: 130 mg/dL (ref 0–200)
HDL: 37.4 mg/dL — AB (ref >39.00)
LDL CALC: 75 mg/dL (ref 0–99)
NONHDL: 92.1
Triglycerides: 87 mg/dL (ref 0.0–149.0)
VLDL: 17.4 mg/dL (ref 0.0–40.0)

## 2016-04-03 LAB — HEMOGLOBIN A1C: Hgb A1c MFr Bld: 6.8 % — ABNORMAL HIGH (ref 4.6–6.5)

## 2016-04-06 ENCOUNTER — Other Ambulatory Visit: Payer: Self-pay

## 2016-04-06 ENCOUNTER — Ambulatory Visit (INDEPENDENT_AMBULATORY_CARE_PROVIDER_SITE_OTHER): Payer: Commercial Managed Care - HMO | Admitting: Endocrinology

## 2016-04-06 ENCOUNTER — Encounter: Payer: Self-pay | Admitting: Endocrinology

## 2016-04-06 VITALS — BP 136/70 | HR 72 | Ht 61.0 in | Wt 199.0 lb

## 2016-04-06 DIAGNOSIS — E119 Type 2 diabetes mellitus without complications: Secondary | ICD-10-CM

## 2016-04-06 DIAGNOSIS — E042 Nontoxic multinodular goiter: Secondary | ICD-10-CM

## 2016-04-06 MED ORDER — POTASSIUM CHLORIDE ER 10 MEQ PO TBCR: 10.0000 meq | EXTENDED_RELEASE_TABLET | Freq: Every day | ORAL | Status: DC

## 2016-04-06 MED ORDER — POTASSIUM CHLORIDE ER 10 MEQ PO TBCR
10.0000 meq | EXTENDED_RELEASE_TABLET | Freq: Every day | ORAL | Status: DC
Start: 2016-04-06 — End: 2016-04-06

## 2016-04-06 NOTE — Progress Notes (Signed)
Patient ID: Janet Williamson, female   DOB: 04-Feb-1941, 75 y.o.   MRN: [id number]   Reason for Appointment: Diabetes follow-up   History of Present Illness   Type 2 DIABETES MELITUS, date of diagnosis 02/2012     She has had mild diabetes which has been well controlled with metformin ER alone. Has had no side effects from metformin, previously was  taking only 1000 mg a day and since 2015 only 750 mg daily She also has had diabetes education in 2013   Current management, blood sugar patterns and problems identified:  On her last visit her blood sugars are relatively higher with A1c 7.1 and she was told to increase her metformin to 1500 mg.  She is tolerating this well and her A1c down to 6.8  She is checking her glucose only after supper lately and these are excellent.  She is still not able to do any exercise because of dyspnea on exertion but her weight is stable  Oral hypoglycemic drugs: Metformin 0.75 g        Side effects from medications: None Monitors blood glucose:  0.4 times a day     Glucometer: One Touch.          Blood Glucose readings from download as follows:  Mean values apply above for all meters except median for One Touch  PRE-MEAL Fasting Lunch Dinner Bedtime Overall  Glucose range:    86-167    Mean/median:     122            Meals: 3 meals per day. Dinner 5-6 pm       Physical activity: exercise: not walking recently       Wt Readings from Last 3 Encounters:  04/06/16 199 lb (90.266 kg)  01/03/16 201 lb 6.4 oz (91.354 kg)  07/07/15 200 lb 9.6 oz (90.992 kg)     Lab Results  Component Value Date   HGBA1C 6.8* 04/03/2016   HGBA1C 7.1* 12/29/2015   HGBA1C 6.7* 07/04/2015   Lab Results  Component Value Date   MICROALBUR 0.9 04/03/2016   LDLCALC 75 04/03/2016   CREATININE 0.73 04/03/2016   Microalbumin/creatinine ratio 6.5 done on 04/04/15  LABS:  Appointment on 04/03/2016  Component Date Value Ref Range Status  . Free T4  04/03/2016 0.93  0.60 - 1.60 ng/dL Final  . TSH 04/03/2016 1.61  0.35 - 4.50 uIU/mL Final  . Cholesterol 04/03/2016 130  0 - 200 mg/dL Final   ATP III Classification       Desirable:  < 200 mg/dL               Borderline High:  200 - 239 mg/dL          High:  > = 240 mg/dL  . Triglycerides 04/03/2016 87.0  0.0 - 149.0 mg/dL Final   Normal:  <150 mg/dLBorderline High:  150 - 199 mg/dL  . HDL 04/03/2016 37.40* >39.00 mg/dL Final  . VLDL 04/03/2016 17.4  0.0 - 40.0 mg/dL Final  . LDL Cholesterol 04/03/2016 75  0 - 99 mg/dL Final  . Total CHOL/HDL Ratio 04/03/2016 3   Final                  Men          Women1/2 Average Risk     3.4          3.3Average Risk          5.0  4.42X Average Risk          9.6          7.13X Average Risk          15.0          11.0                      . NonHDL 04/03/2016 92.10   Final   NOTE:  Non-HDL goal should be 30 mg/dL higher than patient's LDL goal (i.e. LDL goal of < 70 mg/dL, would have non-HDL goal of < 100 mg/dL)  . Microalb, Ur 04/03/2016 0.9  0.0 - 1.9 mg/dL Final  . Creatinine,U 04/03/2016 178.5   Final  . Microalb Creat Ratio 04/03/2016 0.5  0.0 - 30.0 mg/g Final  . Hgb A1c MFr Bld 04/03/2016 6.8* 4.6 - 6.5 % Final   Glycemic Control Guidelines for People with Diabetes:Non Diabetic:  <6%Goal of Therapy: <7%Additional Action Suggested:  >8%   . Sodium 04/03/2016 137  135 - 145 mEq/L Final  . Potassium 04/03/2016 3.4* 3.5 - 5.1 mEq/L Final  . Chloride 04/03/2016 100  96 - 112 mEq/L Final  . CO2 04/03/2016 35* 19 - 32 mEq/L Final  . Glucose, Bld 04/03/2016 103* 70 - 99 mg/dL Final  . BUN 04/03/2016 14  6 - 23 mg/dL Final  . Creatinine, Ser 04/03/2016 0.73  0.40 - 1.20 mg/dL Final  . Total Bilirubin 04/03/2016 0.4  0.2 - 1.2 mg/dL Final  . Alkaline Phosphatase 04/03/2016 57  39 - 117 U/L Final  . AST 04/03/2016 9  0 - 37 U/L Final  . ALT 04/03/2016 6  0 - 35 U/L Final  . Total Protein 04/03/2016 8.3  6.0 - 8.3 g/dL Final  . Albumin 04/03/2016  3.9  3.5 - 5.2 g/dL Final  . Calcium 04/03/2016 9.9  8.4 - 10.5 mg/dL Final  . GFR 04/03/2016 99.87  >60.00 mL/min Final      Medication List       This list is accurate as of: 04/06/16  5:01 PM.  Always use your most recent med list.               ACCU-CHEK AVIVA PLUS w/Device Kit     ACCU-CHEK SOFTCLIX LANCETS lancets     amLODipine 5 MG tablet  Commonly known as:  NORVASC  Take 5 mg by mouth daily.     aspirin 81 MG tablet  Take 81 mg by mouth daily.     CALCIUM 600 PO  Take 1 tablet by mouth daily.     carboxymethylcellulose 0.5 % Soln  Commonly known as:  REFRESH PLUS  Apply 1 drop to eye 2 (two) times daily as needed (for dry eyes).     cholecalciferol 1000 units tablet  Commonly known as:  VITAMIN D  Take 1,000 Units by mouth daily.     clopidogrel 75 MG tablet  Commonly known as:  PLAVIX  Take 75 mg by mouth daily.     GNP ALCOHOL SWABS 70 % Pads     hydrochlorothiazide 25 MG tablet  Commonly known as:  HYDRODIURIL  Take 25 mg by mouth daily.     metFORMIN 750 MG 24 hr tablet  Commonly known as:  GLUCOPHAGE-XR  Take 2 tablets (1,500 mg total) by mouth daily.     metoprolol succinate 50 MG 24 hr tablet  Commonly known as:  TOPROL-XL  Take 50 mg by mouth daily. Take with or  immediately following a meal.     ONE TOUCH ULTRA TEST test strip  Generic drug:  glucose blood  1 each by Other route every other day.     potassium chloride 10 MEQ tablet  Commonly known as:  KLOR-CON 10  Take 1 tablet (10 mEq total) by mouth daily.     simvastatin 20 MG tablet  Commonly known as:  ZOCOR  Take 20 mg by mouth every evening.        Allergies:  Allergies  Allergen Reactions  . Ace Inhibitors     angioedema  . Keflex [Cephalexin]   . Ramipril Swelling  . Rifadin [Rifampin]     Past Medical History  Diagnosis Date  . Diabetes mellitus   . Hypertension   . Elevated cholesterol   . Cardiomyopathy in other disease   . Mitral valve disorder   .  Heart murmur   . Generalized osteoarthritis   . Anemia   . Phlebitis   . Thrombophlebitis   . Stroke ([place])   . Cataracts, bilateral   . GERD (gastroesophageal reflux disease)   . Aortic stenosis     moderate AS 10/2012 echo (Dr. Marlou Porch)    Past Surgical History  Procedure Laterality Date  . Abdominal hysterectomy    . Eye surgery      cataract removal  . Total knee arthroplasty Right 04/14/2013    Dr Marlou Sa  . Total knee arthroplasty Right 04/14/2013    Procedure: TOTAL KNEE ARTHROPLASTY;  Surgeon: Meredith Pel, MD;  Location: [facility];  Service: Orthopedics;  Laterality: Right;    Family History  Problem Relation Age of Onset  . Heart attack Neg Hx   . Stroke Mother   . Hypertension Mother   . Hypertension Father     Social History:  reports that she has never smoked. She has never used smokeless tobacco. She reports that she does not drink alcohol or use illicit drugs.  Review of Systems:  HYPERTENSION:  she has been managed with a 3 drug regimen, followed by PCP.   HYPERLIPIDEMIA: The lipid abnormality consists of elevated LDL treated with simvastatin by PCP and LDL is controlled Last LDL was 87 in 12/16  Lab Results  Component Value Date   CHOL 130 04/03/2016   HDL 37.40* 04/03/2016   LDLCALC 75 04/03/2016   TRIG 87.0 04/03/2016   CHOLHDL 3 04/03/2016    Multinodular goiter: She has had a long-standing multinodular goiter, last ultrasound was done in 05/2012 which showed a 4.1 cm nodule in the isthmus and other nodules Has not had any thyroid dysfunction  Her last exam was stable  Lab Results  Component Value Date   TSH 1.61 04/03/2016    No history of numbness or tingling in her feet Foot exam done in 12 /16 by her PCP     Examination: BP 136/70 mmHg  Pulse 72  Ht _0  (1.549 m)  Wt 199 lb (90.266 kg)  BMI 37.62 kg/m2  SpO2 98%  Body mass index is 37.62 kg/(m^2).    Right lobe enlarged twice normal, isthmus shows a relatively soft smooth  nodule about 3 cm across  ASSESSMENT/ PLAN:   Diabetes type 2   The patient's diabetes control appears to be better with going up to 1500 mg metformin Her weight is stable Glucose readings at home are excellent although she is checking only after supper She is trying to be compliant with diet also but not able to exercise  She will continue 1500 mg of metformin ER  Multinodular goiter: Stable, still euthyroid      Ivyana Locey 04/06/2016, 5:01 PM

## 2016-04-06 NOTE — Patient Instructions (Signed)
Check blood sugars on waking up 1-2   times a week Also check blood sugars about 2 hours after a meal and do this after different meals by rotation  Recommended blood sugar levels on waking up is 90-130 and about 2 hours after meal is 130-160  Please bring your blood sugar monitor to each visit, thank you  

## 2016-04-09 DIAGNOSIS — Z0000 Encounter for general adult medical examination without abnormal findings: Secondary | ICD-10-CM | POA: Diagnosis not present

## 2016-04-09 DIAGNOSIS — I639 Cerebral infarction, unspecified: Secondary | ICD-10-CM | POA: Diagnosis not present

## 2016-04-09 DIAGNOSIS — E042 Nontoxic multinodular goiter: Secondary | ICD-10-CM | POA: Diagnosis not present

## 2016-04-09 DIAGNOSIS — I10 Essential (primary) hypertension: Secondary | ICD-10-CM | POA: Diagnosis not present

## 2016-04-09 DIAGNOSIS — Z124 Encounter for screening for malignant neoplasm of cervix: Secondary | ICD-10-CM | POA: Diagnosis not present

## 2016-04-09 DIAGNOSIS — M1990 Unspecified osteoarthritis, unspecified site: Secondary | ICD-10-CM | POA: Diagnosis not present

## 2016-04-09 DIAGNOSIS — E118 Type 2 diabetes mellitus with unspecified complications: Secondary | ICD-10-CM | POA: Diagnosis not present

## 2016-04-09 DIAGNOSIS — E785 Hyperlipidemia, unspecified: Secondary | ICD-10-CM | POA: Diagnosis not present

## 2016-04-11 DIAGNOSIS — M8589 Other specified disorders of bone density and structure, multiple sites: Secondary | ICD-10-CM | POA: Diagnosis not present

## 2016-04-11 DIAGNOSIS — M8588 Other specified disorders of bone density and structure, other site: Secondary | ICD-10-CM | POA: Diagnosis not present

## 2016-04-12 DIAGNOSIS — N63 Unspecified lump in breast: Secondary | ICD-10-CM | POA: Diagnosis not present

## 2016-04-12 DIAGNOSIS — N6002 Solitary cyst of left breast: Secondary | ICD-10-CM | POA: Diagnosis not present

## 2016-04-17 ENCOUNTER — Other Ambulatory Visit: Payer: Self-pay | Admitting: Radiology

## 2016-04-17 DIAGNOSIS — N6002 Solitary cyst of left breast: Secondary | ICD-10-CM | POA: Diagnosis not present

## 2016-04-17 DIAGNOSIS — N6012 Diffuse cystic mastopathy of left breast: Secondary | ICD-10-CM | POA: Diagnosis not present

## 2016-04-17 DIAGNOSIS — R938 Abnormal findings on diagnostic imaging of other specified body structures: Secondary | ICD-10-CM | POA: Diagnosis not present

## 2016-04-17 DIAGNOSIS — N6489 Other specified disorders of breast: Secondary | ICD-10-CM | POA: Diagnosis not present

## 2016-04-17 DIAGNOSIS — R590 Localized enlarged lymph nodes: Secondary | ICD-10-CM | POA: Diagnosis not present

## 2016-05-01 DIAGNOSIS — N6002 Solitary cyst of left breast: Secondary | ICD-10-CM | POA: Diagnosis not present

## 2016-05-09 DIAGNOSIS — Z96651 Presence of right artificial knee joint: Secondary | ICD-10-CM | POA: Diagnosis not present

## 2016-05-30 ENCOUNTER — Ambulatory Visit (INDEPENDENT_AMBULATORY_CARE_PROVIDER_SITE_OTHER): Payer: Commercial Managed Care - HMO | Admitting: Cardiology

## 2016-05-30 ENCOUNTER — Encounter: Payer: Self-pay | Admitting: Cardiology

## 2016-05-30 VITALS — BP 138/60 | HR 78 | Ht 65.0 in | Wt 198.0 lb

## 2016-05-30 DIAGNOSIS — I058 Other rheumatic mitral valve diseases: Secondary | ICD-10-CM

## 2016-05-30 DIAGNOSIS — I10 Essential (primary) hypertension: Secondary | ICD-10-CM | POA: Diagnosis not present

## 2016-05-30 DIAGNOSIS — I059 Rheumatic mitral valve disease, unspecified: Secondary | ICD-10-CM

## 2016-05-30 DIAGNOSIS — I350 Nonrheumatic aortic (valve) stenosis: Secondary | ICD-10-CM | POA: Diagnosis not present

## 2016-05-30 NOTE — Progress Notes (Signed)
[facility] [address] [place],   [address] Phone: [phone] Fax:  [phone]  Date:  05/30/2016   ID:  Janet Williamson, DOB 05-08-1941, MRN [id number]  PCP:  Osborne Casco, MD   History of Present Illness: Janet Williamson is a 75 y.o. female with mitral annular calcification/mass of 1.3 cm prior history of embolic CVA in 9323 (Plavix), moderate aortic stenosis here for followup. Cardiac catheterization showed no flow-limiting coronary artery disease in January of 2011. We are monitoring her mass on mitral valve/anulus/ aortic stenosis.  Overall, she is doing quite except for dyspnea on exertion may be worsening, no chest pain, no fevers, no chills. No strokelike symptoms that are new.  Echocardiogram as below from 2015. Moderate aortic stenosis was noted. She has a loud musical murmur. She is ambulating well. Continue with her medications. Personally reviewed 2016 echocardiogram which still shows moderate range.      Wt Readings from Last 3 Encounters:  05/30/16 198 lb (89.8 kg)  04/06/16 199 lb (90.3 kg)  01/03/16 201 lb 6.4 oz (91.4 kg)     Past Medical History:  Diagnosis Date  . Anemia   . Aortic stenosis    moderate AS 10/2012 echo (Dr. Marlou Porch)  . Cardiomyopathy in other disease   . Cataracts, bilateral   . Diabetes mellitus   . Elevated cholesterol   . Generalized osteoarthritis   . GERD (gastroesophageal reflux disease)   . Heart murmur   . Hypertension   . Mitral valve disorder   . Phlebitis   . Stroke ([place])   . Thrombophlebitis     Past Surgical History:  Procedure Laterality Date  . ABDOMINAL HYSTERECTOMY    . EYE SURGERY     cataract removal  . TOTAL KNEE ARTHROPLASTY Right 04/14/2013   Dr Marlou Sa  . TOTAL KNEE ARTHROPLASTY Right 04/14/2013   Procedure: TOTAL KNEE ARTHROPLASTY;  Surgeon: Meredith Pel, MD;  Location: [facility];  Service: Orthopedics;  Laterality: Right;    Current Outpatient Prescriptions  Medication  Sig Dispense Refill  . ACCU-CHEK SOFTCLIX LANCETS lancets     . amLODipine (NORVASC) 5 MG tablet Take 5 mg by mouth daily.    Marland Kitchen aspirin 81 MG tablet Take 81 mg by mouth daily.    . Blood Glucose Monitoring Suppl (ACCU-CHEK AVIVA PLUS) w/Device KIT     . Calcium Carbonate (CALCIUM 600 PO) Take 1 tablet by mouth daily.    . carboxymethylcellulose (REFRESH PLUS) 0.5 % SOLN Apply 1 drop to eye 2 (two) times daily as needed (for dry eyes).    . cholecalciferol (VITAMIN D) 1000 UNITS tablet Take 1,000 Units by mouth daily.    . clopidogrel (PLAVIX) 75 MG tablet Take 75 mg by mouth daily.    . GNP ALCOHOL SWABS 70 % PADS     . hydrochlorothiazide (HYDRODIURIL) 25 MG tablet Take 25 mg by mouth daily.    . metFORMIN (GLUCOPHAGE-XR) 750 MG 24 hr tablet Take 2 tablets (1,500 mg total) by mouth daily. 180 tablet 2  . metoprolol succinate (TOPROL-XL) 50 MG 24 hr tablet Take 50 mg by mouth daily. Take with or immediately following a meal.    . ONE TOUCH ULTRA TEST test strip 1 each by Other route every other day.     . potassium chloride (KLOR-CON 10) 10 MEQ tablet Take 1 tablet (10 mEq total) by mouth daily. 30 tablet 0  . simvastatin (ZOCOR) 20 MG tablet Take  20 mg by mouth every evening.     No current facility-administered medications for this visit.     Allergies:    Allergies  Allergen Reactions  . Ace Inhibitors     angioedema  . Keflex [Cephalexin]   . Ramipril Swelling  . Rifadin [Rifampin]     Social History:  The patient  reports that she has never smoked. She has never used smokeless tobacco. She reports that she does not drink alcohol or use drugs.   ROS:  Please see the history of present illness.   No CP, no SOB, no syncope  PHYSICAL EXAM: VS:  BP 138/60   Pulse 78   Ht 5' 5"  (1.651 m)   Wt 198 lb (89.8 kg)   BMI 32.95 kg/m  Well nourished, well developed, in no acute distress  HEENT: normal  Neck: no JVD Minimally delayed carotid upstroke Cardiac:  normal S1, S2; RRR;  3/6 harsh musical murmur heard best at right upper sternal border with radiation to the carotid arteries  Lungs:  clear to auscultation bilaterally, no wheezing, rhonchi or rales  Abd: soft, nontender, no hepatomegaly  Ext: no edema  Skin: warm and dry  Neuro: no focal abnormalities noted  Labs: 12/14-LDL 82  EKG:  EKG ordered today 05/30/16-sinus rhythm, 78, left bundle branch block. 04/22/14: NSR 66, NSSTW changes left bundle-branch-like morphology is new when compared to prior EKG with narrow QRS. Personally viewed.   ECHO: 11/03/13:  - Left ventricle: The cavity size was normal. There was moderate focal basal and mild concentric hypertrophy. Systolic function was vigorous. The estimated ejection fraction was in the range of 65% to 70%. Wall motion was normal; there were no regional wall motion abnormalities. There was an increased relative contribution of atrial contraction to ventricular filling. Features are consistent with a pseudonormal left ventricular filling pattern, with concomitant abnormal relaxation and increased filling pressure (grade 2 diastolic dysfunction). - Aortic valve: Severe thickening and calcification, consistent with sclerosis. There was moderate stenosis. Mild regurgitation. Mean gradient: 42m Hg (S). Peak gradient: 55mHg (S). - Mitral valve: Calcified annulus. Mildly thickened leaflets . Mild regurgitation. - Left atrium: The atrium was mildly dilated. - Atrial septum: There was increased thickness of the septum, consistent with lipomatous hypertrophy. - Pulmonary arteries: PA peak pressure: 5117mg (S). Impressions:  - The right ventricular systolic pressure was increased consistent with mild pulmonary hypertension.  ECHO 11/03/14:  AS - mean 71m14m Peak 3.66m/s62m change    ASSESSMENT AND PLAN:  1. Moderate aortic stenosis- Repeat yearly. Harsh musical murmur. Radiation to the carotids noted. When severe and associated with symptoms,  aortic valve replacement. The results of echo will be called in. She is having some shortness of breath with activity which seems to be somewhat progressive. 2. Mitral valve/annular mass. Monitoring on echocardiogram. Previously did not appear as prominent. 3. Hyperlipidemia-continue with current therapy. LDL excellent 4. Hypertension-currently mildly elevated. Continue to monitor closely. No changes made today. Just at the upper limit of normal. 5. Diabetes-per primary team. Dr. GriffLaurann MontanaWe will see back in 6 months.  Signed, Yostin Malacara Candee FurbishFACC [facility] A3/2017 10:00 AM

## 2016-05-30 NOTE — Patient Instructions (Signed)
Medication Instructions:  Your physician recommends that you continue on your current medications as directed. Please refer to the Current Medication list given to you today.   Labwork: NONE  Testing/Procedures: Your physician has requested that you have an echocardiogram. Echocardiography is a painless test that uses sound waves to create images of your heart. It provides your doctor with information about the size and shape of your heart and how well your heart's chambers and valves are working. This procedure takes approximately one hour. There are no restrictions for this procedure.    Follow-Up: Your physician wants you to follow-up in: 6 MONTHS WITH DR. Anne Fu You will receive a reminder letter in the mail two months in advance. If you don't receive a letter, please call our office to schedule the follow-up appointment.   Any Other Special Instructions Will Be Listed Below (If Applicable).     If you need a refill on your cardiac medications before your next appointment, please call your pharmacy.

## 2016-06-07 ENCOUNTER — Ambulatory Visit (HOSPITAL_COMMUNITY): Payer: Commercial Managed Care - HMO | Attending: Cardiology

## 2016-06-07 ENCOUNTER — Other Ambulatory Visit: Payer: Self-pay

## 2016-06-07 DIAGNOSIS — I350 Nonrheumatic aortic (valve) stenosis: Secondary | ICD-10-CM | POA: Diagnosis not present

## 2016-06-07 DIAGNOSIS — D649 Anemia, unspecified: Secondary | ICD-10-CM | POA: Diagnosis not present

## 2016-06-07 DIAGNOSIS — I119 Hypertensive heart disease without heart failure: Secondary | ICD-10-CM | POA: Diagnosis not present

## 2016-06-07 DIAGNOSIS — E119 Type 2 diabetes mellitus without complications: Secondary | ICD-10-CM | POA: Insufficient documentation

## 2016-06-07 DIAGNOSIS — I352 Nonrheumatic aortic (valve) stenosis with insufficiency: Secondary | ICD-10-CM | POA: Insufficient documentation

## 2016-06-07 DIAGNOSIS — I429 Cardiomyopathy, unspecified: Secondary | ICD-10-CM | POA: Insufficient documentation

## 2016-06-07 DIAGNOSIS — I052 Rheumatic mitral stenosis with insufficiency: Secondary | ICD-10-CM | POA: Diagnosis not present

## 2016-06-07 DIAGNOSIS — I272 Other secondary pulmonary hypertension: Secondary | ICD-10-CM | POA: Insufficient documentation

## 2016-06-19 ENCOUNTER — Telehealth: Payer: Self-pay | Admitting: Cardiology

## 2016-06-19 NOTE — Telephone Encounter (Signed)
Reviewed results of echo with pt who states understanding.  She will repeat in 1 yr as ordered.

## 2016-06-19 NOTE — Telephone Encounter (Signed)
New message ° ° ° ° ° ° °Pt returning nurse call  °

## 2016-06-21 DIAGNOSIS — H04123 Dry eye syndrome of bilateral lacrimal glands: Secondary | ICD-10-CM | POA: Diagnosis not present

## 2016-06-21 DIAGNOSIS — H5211 Myopia, right eye: Secondary | ICD-10-CM | POA: Diagnosis not present

## 2016-06-21 DIAGNOSIS — H5319 Other subjective visual disturbances: Secondary | ICD-10-CM | POA: Diagnosis not present

## 2016-06-21 DIAGNOSIS — H524 Presbyopia: Secondary | ICD-10-CM | POA: Diagnosis not present

## 2016-06-21 LAB — HM DIABETES EYE EXAM

## 2016-07-03 ENCOUNTER — Encounter (INDEPENDENT_AMBULATORY_CARE_PROVIDER_SITE_OTHER): Payer: Commercial Managed Care - HMO | Admitting: Ophthalmology

## 2016-07-11 ENCOUNTER — Encounter (INDEPENDENT_AMBULATORY_CARE_PROVIDER_SITE_OTHER): Payer: Commercial Managed Care - HMO | Admitting: Ophthalmology

## 2016-07-11 DIAGNOSIS — H35033 Hypertensive retinopathy, bilateral: Secondary | ICD-10-CM | POA: Diagnosis not present

## 2016-07-11 DIAGNOSIS — H34231 Retinal artery branch occlusion, right eye: Secondary | ICD-10-CM

## 2016-07-11 DIAGNOSIS — H5340 Unspecified visual field defects: Secondary | ICD-10-CM

## 2016-07-11 DIAGNOSIS — I10 Essential (primary) hypertension: Secondary | ICD-10-CM | POA: Diagnosis not present

## 2016-07-11 DIAGNOSIS — H43813 Vitreous degeneration, bilateral: Secondary | ICD-10-CM

## 2016-07-23 ENCOUNTER — Other Ambulatory Visit: Payer: Self-pay | Admitting: Endocrinology

## 2016-08-01 ENCOUNTER — Ambulatory Visit: Payer: Commercial Managed Care - HMO

## 2016-08-01 ENCOUNTER — Other Ambulatory Visit (INDEPENDENT_AMBULATORY_CARE_PROVIDER_SITE_OTHER): Payer: Commercial Managed Care - HMO

## 2016-08-01 DIAGNOSIS — E119 Type 2 diabetes mellitus without complications: Secondary | ICD-10-CM

## 2016-08-01 LAB — COMPREHENSIVE METABOLIC PANEL
ALBUMIN: 3.9 g/dL (ref 3.5–5.2)
ALK PHOS: 61 U/L (ref 39–117)
ALT: 9 U/L (ref 0–35)
AST: 15 U/L (ref 0–37)
BILIRUBIN TOTAL: 0.3 mg/dL (ref 0.2–1.2)
BUN: 13 mg/dL (ref 6–23)
CALCIUM: 10.2 mg/dL (ref 8.4–10.5)
CO2: 31 mEq/L (ref 19–32)
Chloride: 101 mEq/L (ref 96–112)
Creatinine, Ser: 0.83 mg/dL (ref 0.40–1.20)
GFR: 86.04 mL/min (ref >60.00)
Glucose, Bld: 145 mg/dL — ABNORMAL HIGH (ref 70–99)
Potassium: 3.7 mEq/L (ref 3.5–5.1)
Sodium: 140 mEq/L (ref 135–145)
Total Protein: 8.2 g/dL (ref 6.0–8.3)

## 2016-08-01 LAB — HEMOGLOBIN A1C: Hgb A1c MFr Bld: 6.9 % — ABNORMAL HIGH (ref 4.6–6.5)

## 2016-08-06 ENCOUNTER — Ambulatory Visit (INDEPENDENT_AMBULATORY_CARE_PROVIDER_SITE_OTHER): Payer: Commercial Managed Care - HMO | Admitting: Endocrinology

## 2016-08-06 ENCOUNTER — Encounter: Payer: Self-pay | Admitting: Endocrinology

## 2016-08-06 VITALS — BP 130/80 | HR 71 | Ht 65.0 in | Wt 202.0 lb

## 2016-08-06 DIAGNOSIS — E1165 Type 2 diabetes mellitus with hyperglycemia: Secondary | ICD-10-CM | POA: Diagnosis not present

## 2016-08-06 NOTE — Progress Notes (Signed)
Patient ID: Janet Williamson, female   DOB: 1941-01-18, 75 y.o.   MRN: [id number]   Reason for Appointment: Diabetes follow-up   History of Present Illness   Type 2 DIABETES MELITUS, date of diagnosis 02/2012     She has had mild diabetes which has been well controlled with metformin ER alone. Has had no side effects from metformin  She also has had diabetes education in 2013   Current management, blood sugar patterns and problems identified:  In 3/17 when her A1c was 7.1  she was told to increase her metformin to 1500 mg.  She is tolerating this well and her A1c is now below 7 consistently, recently 6.9  She tries to be a little more active but has gained a couple of pounds  She thinks her diet is usually good  She is checking her glucose only after supper again and these are not high  Her fasting glucose was 145 and she has no morning readings at home  Oral hypoglycemic drugs: Metformin 1.5 g        Side effects from medications: None Monitors blood glucose:  less than once a day     Glucometer:  Accu-Chek  Blood Glucose readings from download as follows:  Mean values apply above for all meters except median for One Touch  PRE-MEAL Fasting Lunch Dinner Bedtime Overall  Glucose range:   117  98-152    Mean/median:    121  120            Meals: 3 meals per day. Dinner At 5-6 pm       Physical activity: exercise: She is trying to walk some       Wt Readings from Last 3 Encounters:  08/06/16 202 lb (91.6 kg)  05/30/16 198 lb (89.8 kg)  04/06/16 199 lb (90.3 kg)     Lab Results  Component Value Date   HGBA1C 6.9 (H) 08/01/2016   HGBA1C 6.8 (H) 04/03/2016   HGBA1C 7.1 (H) 12/29/2015   Lab Results  Component Value Date   MICROALBUR 0.9 04/03/2016   LDLCALC 75 04/03/2016   CREATININE 0.83 08/01/2016   Microalbumin/creatinine ratio 6.5 done on 04/04/15  LABS:  Lab on 08/01/2016  Component Date Value Ref Range Status  . Hgb A1c MFr Bld 08/01/2016  6.9* 4.6 - 6.5 % Final  . Sodium 08/01/2016 140  135 - 145 mEq/L Final  . Potassium 08/01/2016 3.7  3.5 - 5.1 mEq/L Final  . Chloride 08/01/2016 101  96 - 112 mEq/L Final  . CO2 08/01/2016 31  19 - 32 mEq/L Final  . Glucose, Bld 08/01/2016 145* 70 - 99 mg/dL Final  . BUN 08/01/2016 13  6 - 23 mg/dL Final  . Creatinine, Ser 08/01/2016 0.83  0.40 - 1.20 mg/dL Final  . Total Bilirubin 08/01/2016 0.3  0.2 - 1.2 mg/dL Final  . Alkaline Phosphatase 08/01/2016 61  39 - 117 U/L Final  . AST 08/01/2016 15  0 - 37 U/L Final  . ALT 08/01/2016 9  0 - 35 U/L Final  . Total Protein 08/01/2016 8.2  6.0 - 8.3 g/dL Final  . Albumin 08/01/2016 3.9  3.5 - 5.2 g/dL Final  . Calcium 08/01/2016 10.2  8.4 - 10.5 mg/dL Final  . GFR 08/01/2016 86.04  >60.00 mL/min Final      Medication List       Accurate as of 08/06/16 11:26 AM. Always use your most recent med list.  ACCU-CHEK AVIVA PLUS w/Device Kit   ACCU-CHEK SOFTCLIX LANCETS lancets   amLODipine 5 MG tablet Commonly known as:  NORVASC Take 5 mg by mouth daily.   aspirin 81 MG tablet Take 81 mg by mouth daily.   CALCIUM 600 PO Take 1 tablet by mouth daily.   carboxymethylcellulose 0.5 % Soln Commonly known as:  REFRESH PLUS Apply 1 drop to eye 2 (two) times daily as needed (for dry eyes).   cholecalciferol 1000 units tablet Commonly known as:  VITAMIN D Take 1,000 Units by mouth daily.   clopidogrel 75 MG tablet Commonly known as:  PLAVIX Take 75 mg by mouth daily.   GNP ALCOHOL SWABS 70 % Pads   hydrochlorothiazide 25 MG tablet Commonly known as:  HYDRODIURIL Take 25 mg by mouth daily.   metFORMIN 750 MG 24 hr tablet Commonly known as:  GLUCOPHAGE-XR TAKE 2 TABLETS (1,500 MG TOTAL) BY MOUTH DAILY.   metoprolol succinate 50 MG 24 hr tablet Commonly known as:  TOPROL-XL Take 50 mg by mouth daily. Take with or immediately following a meal.   ONE TOUCH ULTRA TEST test strip Generic drug:  glucose blood 1 each by  Other route every other day.   potassium chloride 10 MEQ tablet Commonly known as:  KLOR-CON 10 Take 1 tablet (10 mEq total) by mouth daily.   simvastatin 20 MG tablet Commonly known as:  ZOCOR Take 20 mg by mouth every evening.       Allergies:  Allergies  Allergen Reactions  . Ace Inhibitors     angioedema  . Keflex [Cephalexin]   . Ramipril Swelling  . Rifadin [Rifampin]     Past Medical History:  Diagnosis Date  . Anemia   . Aortic stenosis    moderate AS 10/2012 echo (Dr. Marlou Porch)  . Cardiomyopathy in other disease   . Cataracts, bilateral   . Diabetes mellitus   . Elevated cholesterol   . Generalized osteoarthritis   . GERD (gastroesophageal reflux disease)   . Heart murmur   . Hypertension   . Mitral valve disorder   . Phlebitis   . Stroke ([place])   . Thrombophlebitis     Past Surgical History:  Procedure Laterality Date  . ABDOMINAL HYSTERECTOMY    . EYE SURGERY     cataract removal  . TOTAL KNEE ARTHROPLASTY Right 04/14/2013   Dr Marlou Sa  . TOTAL KNEE ARTHROPLASTY Right 04/14/2013   Procedure: TOTAL KNEE ARTHROPLASTY;  Surgeon: Meredith Pel, MD;  Location: [facility];  Service: Orthopedics;  Laterality: Right;    Family History  Problem Relation Age of Onset  . Heart attack Neg Hx   . Stroke Mother   . Hypertension Mother   . Hypertension Father     Social History:  reports that she has never smoked. She has never used smokeless tobacco. She reports that she does not drink alcohol or use drugs.  Review of Systems:  HYPERTENSION:  she has been managed with a 3 drug regimen Including hydrochlorothiazide, followed by PCP.   HYPERLIPIDEMIA: The lipid abnormality consists of elevated LDL treated with simvastatin by PCP and LDL is controlled   Lab Results  Component Value Date   CHOL 130 04/03/2016   HDL 37.40 (L) 04/03/2016   LDLCALC 75 04/03/2016   TRIG 87.0 04/03/2016   CHOLHDL 3 04/03/2016    Multinodular goiter: She has had a  long-standing multinodular goiter, last ultrasound was done in 05/2012 which showed a 4.1 cm nodule in the  isthmus and other nodules Her last exam was stable  TSH has been consistently normal  Lab Results  Component Value Date   TSH 1.61 04/03/2016    No history of numbness or tingling in her feet Foot exam done in 12 /16 by her PCP     Examination: BP 130/80   Pulse 71   Ht _0  (1.651 m)   Wt 202 lb (91.6 kg)   SpO2 97%   BMI 33.61 kg/m   Body mass index is 33.61 kg/m.    ASSESSMENT/ PLAN:   Diabetes type 2  See history of present illness for detailed discussion of current diabetes management, blood sugar patterns and problems identified  Her A1c is again below 70 However fasting reading was 145 at home She is checking blood sugars only in the evenings at home Discussed that she can periodically check readings in the mornings She has limited ability to exercise but advised her to try to improve diet for weight loss  She will continue 1500 mg of metformin ER  HYPERTENSION: Appears to be well controlled      Ronya Gilcrest 08/06/2016, 11:26 AM

## 2016-08-06 NOTE — Patient Instructions (Signed)
Check blood sugars on waking up    Also check blood sugars about 2 hours after a meal and do this after different meals by rotation  Recommended blood sugar levels on waking up is 90-140 and about 2 hours after meal is 130-160  Please bring your blood sugar monitor to each visit, thank you

## 2016-09-24 ENCOUNTER — Other Ambulatory Visit: Payer: Self-pay | Admitting: Endocrinology

## 2016-11-28 ENCOUNTER — Other Ambulatory Visit: Payer: Self-pay | Admitting: Endocrinology

## 2016-11-29 ENCOUNTER — Other Ambulatory Visit: Payer: Commercial Managed Care - HMO

## 2016-12-04 ENCOUNTER — Ambulatory Visit: Payer: Commercial Managed Care - HMO | Admitting: Endocrinology

## 2016-12-11 ENCOUNTER — Ambulatory Visit: Payer: Commercial Managed Care - HMO | Admitting: Endocrinology

## 2016-12-17 ENCOUNTER — Other Ambulatory Visit: Payer: Commercial Managed Care - HMO

## 2016-12-18 ENCOUNTER — Other Ambulatory Visit (INDEPENDENT_AMBULATORY_CARE_PROVIDER_SITE_OTHER): Payer: Commercial Managed Care - HMO

## 2016-12-18 DIAGNOSIS — E1165 Type 2 diabetes mellitus with hyperglycemia: Secondary | ICD-10-CM | POA: Diagnosis not present

## 2016-12-18 LAB — BASIC METABOLIC PANEL
BUN: 14 mg/dL (ref 6–23)
CALCIUM: 10.7 mg/dL — AB (ref 8.4–10.5)
CO2: 32 meq/L (ref 19–32)
Chloride: 99 mEq/L (ref 96–112)
Creatinine, Ser: 0.74 mg/dL (ref 0.40–1.20)
GFR: 98.12 mL/min (ref >60.00)
GLUCOSE: 116 mg/dL — AB (ref 70–99)
POTASSIUM: 3.9 meq/L (ref 3.5–5.1)
SODIUM: 137 meq/L (ref 135–145)

## 2016-12-18 LAB — HEMOGLOBIN A1C: HEMOGLOBIN A1C: 6.9 % — AB (ref 4.6–6.5)

## 2016-12-25 ENCOUNTER — Ambulatory Visit (INDEPENDENT_AMBULATORY_CARE_PROVIDER_SITE_OTHER): Payer: Commercial Managed Care - HMO | Admitting: Endocrinology

## 2016-12-25 ENCOUNTER — Encounter: Payer: Self-pay | Admitting: Endocrinology

## 2016-12-25 VITALS — BP 164/82 | HR 76 | Ht 65.0 in | Wt 201.0 lb

## 2016-12-25 DIAGNOSIS — E119 Type 2 diabetes mellitus without complications: Secondary | ICD-10-CM | POA: Diagnosis not present

## 2016-12-25 DIAGNOSIS — I10 Essential (primary) hypertension: Secondary | ICD-10-CM

## 2016-12-25 NOTE — Patient Instructions (Addendum)
Take 2 amlodipine daily  Walk daily  Stop calcium

## 2016-12-25 NOTE — Progress Notes (Signed)
Patient ID: Janet Williamson, female   DOB: Feb 04, 1941, 76 y.o.   MRN: [id number]   Reason for Appointment: Diabetes follow-up   History of Present Illness   Type 2 DIABETES MELITUS, date of diagnosis 02/2012     She has had mild diabetes which has been well controlled with metformin ER alone. Has had no side effects from metformin  She also has had diabetes education in 2013 . Oral hypoglycemic drugs: Metformin 1.5 g        Side effects from medications: None  Her A1c tends to be higher than expected for her blood sugars and is again 6.9%   Current management, blood sugar patterns and problems identified:  She has not brought her monitor for download and not clear how often she is checking readings, by recall they are as below  She thinks her blood sugars are generally not over 150 and highest reading of 165 was when she was stressed  Still has difficulty losing weight  She thinks her diet is usually good  Her fasting glucose was 116 in the lab   Monitors blood glucose:  less than once a day     Glucometer:  Accu-Chek  Blood Glucose readings from recall as follows:  Home sugars 95-165           Meals: 3 meals per day. Dinner At 5-6 pm       Physical activity: exercise: She is trying to walk when she is able to get out       Wt Readings from Last 3 Encounters:  12/25/16 201 lb (91.2 kg)  08/06/16 202 lb (91.6 kg)  05/30/16 198 lb (89.8 kg)     Lab Results  Component Value Date   HGBA1C 6.9 (H) 12/18/2016   HGBA1C 6.9 (H) 08/01/2016   HGBA1C 6.8 (H) 04/03/2016   Lab Results  Component Value Date   MICROALBUR 0.9 04/03/2016   LDLCALC 75 04/03/2016   CREATININE 0.74 12/18/2016      LABS:  No visits with results within 1 Week(s) from this visit.  Latest known visit with results is:  Lab on 12/18/2016  Component Date Value Ref Range Status  . Hgb A1c MFr Bld 12/18/2016 6.9* 4.6 - 6.5 % Final  . Sodium 12/18/2016 137  135 - 145 mEq/L Final  .  Potassium 12/18/2016 3.9  3.5 - 5.1 mEq/L Final  . Chloride 12/18/2016 99  96 - 112 mEq/L Final  . CO2 12/18/2016 32  19 - 32 mEq/L Final  . Glucose, Bld 12/18/2016 116* 70 - 99 mg/dL Final  . BUN 12/18/2016 14  6 - 23 mg/dL Final  . Creatinine, Ser 12/18/2016 0.74  0.40 - 1.20 mg/dL Final  . Calcium 12/18/2016 10.7* 8.4 - 10.5 mg/dL Final  . GFR 12/18/2016 98.12  >60.00 mL/min Final    Allergies as of 12/25/2016      Reactions   Ace Inhibitors    angioedema   Keflex [cephalexin]    Ramipril Swelling   Rifadin [rifampin]       Medication List       Accurate as of 12/25/16 11:13 AM. Always use your most recent med list.          ACCU-CHEK AVIVA PLUS w/Device Kit   ACCU-CHEK SOFTCLIX LANCETS lancets   amLODipine 5 MG tablet Commonly known as:  NORVASC Take 5 mg by mouth daily.   aspirin 81 MG tablet Take 81 mg by mouth daily.   CALCIUM 600  PO Take 1 tablet by mouth daily.   carboxymethylcellulose 0.5 % Soln Commonly known as:  REFRESH PLUS Apply 1 drop to eye 2 (two) times daily as needed (for dry eyes).   cholecalciferol 1000 units tablet Commonly known as:  VITAMIN D Take 1,000 Units by mouth daily.   clopidogrel 75 MG tablet Commonly known as:  PLAVIX Take 75 mg by mouth daily.   GNP ALCOHOL SWABS 70 % Pads   hydrochlorothiazide 25 MG tablet Commonly known as:  HYDRODIURIL Take 25 mg by mouth daily.   metFORMIN 750 MG 24 hr tablet Commonly known as:  GLUCOPHAGE-XR TAKE 2 TABLETS EVERY DAY   metoprolol succinate 50 MG 24 hr tablet Commonly known as:  TOPROL-XL Take 50 mg by mouth daily. Take with or immediately following a meal.   ONE TOUCH ULTRA TEST test strip Generic drug:  glucose blood 1 each by Other route every other day.   potassium chloride 10 MEQ tablet Commonly known as:  KLOR-CON 10 Take 1 tablet (10 mEq total) by mouth daily.   simvastatin 20 MG tablet Commonly known as:  ZOCOR Take 20 mg by mouth every evening.        Allergies:  Allergies  Allergen Reactions  . Ace Inhibitors     angioedema  . Keflex [Cephalexin]   . Ramipril Swelling  . Rifadin [Rifampin]     Past Medical History:  Diagnosis Date  . Anemia   . Aortic stenosis    moderate AS 10/2012 echo (Dr. Marlou Porch)  . Cardiomyopathy in other disease   . Cataracts, bilateral   . Diabetes mellitus   . Elevated cholesterol   . Generalized osteoarthritis   . GERD (gastroesophageal reflux disease)   . Heart murmur   . Hypertension   . Mitral valve disorder   . Phlebitis   . Stroke ([place])   . Thrombophlebitis     Past Surgical History:  Procedure Laterality Date  . ABDOMINAL HYSTERECTOMY    . EYE SURGERY     cataract removal  . TOTAL KNEE ARTHROPLASTY Right 04/14/2013   Dr Marlou Sa  . TOTAL KNEE ARTHROPLASTY Right 04/14/2013   Procedure: TOTAL KNEE ARTHROPLASTY;  Surgeon: Meredith Pel, MD;  Location: [facility];  Service: Orthopedics;  Laterality: Right;    Family History  Problem Relation Age of Onset  . Heart attack Neg Hx   . Stroke Mother   . Hypertension Mother   . Hypertension Father     Social History:  reports that she has never smoked. She has never used smokeless tobacco. She reports that she does not drink alcohol or use drugs.  Review of Systems:  HYPERTENSION:  she has been managed with 5 mg amlodipine, 50 mg metoprolol and hydrochlorothiazide, followed by PCP.  She had swelling of the lips with ramipril and has not been prescribed an ARB previously  HYPERLIPIDEMIA: The lipid abnormality consists of elevated LDL treated with simvastatin by PCP and LDL is controlled   Lab Results  Component Value Date   CHOL 130 04/03/2016   HDL 37.40 (L) 04/03/2016   LDLCALC 75 04/03/2016   TRIG 87.0 04/03/2016   CHOLHDL 3 04/03/2016    Multinodular goiter: She has had a long-standing multinodular goiter, last ultrasound was done in 05/2012 which showed a 4.1 cm nodule in the isthmus and other nodules  TSH has been  consistently normal  Lab Results  Component Value Date   TSH 1.61 04/03/2016    No history of numbness or  tingling in her feet Foot exam done in 12 /16 by her PCP Last eye exam was in 9/17  HYPERCALCEMIA: Her potassium is higher than usual and this has not been present previously although it has been high normal Currently taking calcium and vitamin D supplements also  Lab Results  Component Value Date   CALCIUM 10.7 (H) 12/18/2016   CALCIUM 10.2 08/01/2016   CALCIUM 9.9 04/03/2016   CALCIUM 10.5 12/29/2015   CALCIUM 10.1 07/04/2015     Examination: BP (!) 164/82   Pulse 76   Ht _0  (1.651 m)   Wt 201 lb (91.2 kg)   SpO2 95%   BMI 33.45 kg/m   Body mass index is 33.45 kg/m.   Repeat blood pressure 160/80 No pedal edema present  ASSESSMENT/ PLAN:   Diabetes type 2  See history of present illness for detailed discussion of current diabetes management, blood sugar patterns and problems identified  Her A1c is again below 7.0 although home readings are relatively good and lab glucose is 116 Most likely she has falsely high A1c Weight is stable She generally watching her diet She has limited ability to exercise but advised her to try to improve diet for weight loss  She will continue 1500 mg of metformin ER Recommended that she check blood sugars at different times of the day and bring her monitor for download  HYPERTENSION: Appears to be not as well controlled now Although she may be a better candidate for adding an ARB drug there may be a small chance of cross reaction with allergy with ramipril Will increase her amlodipine to 2 tablets until seen by PCP and defer management to her   HYPERCALCEMIA: This may not be related to hyperparathyroidism since she is on large dose HCTZ and takes calcium supplements For now she can stop her calcium supplement and consider reducing HCTZ If calcium still high will consider checking PTH  Total visit time to evaluate multiple  problems and counseling = 25 minutes  Dontavia Brand 12/25/2016, 11:13 AM   Note: This office note was prepared with Estate agent. Any transcriptional errors that result from this process are unintentional.

## 2017-01-01 ENCOUNTER — Ambulatory Visit: Payer: Commercial Managed Care - HMO | Admitting: Endocrinology

## 2017-01-03 ENCOUNTER — Encounter: Payer: Self-pay | Admitting: Cardiology

## 2017-01-03 ENCOUNTER — Ambulatory Visit (INDEPENDENT_AMBULATORY_CARE_PROVIDER_SITE_OTHER): Payer: Medicare HMO | Admitting: Cardiology

## 2017-01-03 VITALS — BP 140/68 | HR 78 | Ht 62.0 in | Wt 202.8 lb

## 2017-01-03 DIAGNOSIS — Z8673 Personal history of transient ischemic attack (TIA), and cerebral infarction without residual deficits: Secondary | ICD-10-CM

## 2017-01-03 DIAGNOSIS — I350 Nonrheumatic aortic (valve) stenosis: Secondary | ICD-10-CM

## 2017-01-03 DIAGNOSIS — I10 Essential (primary) hypertension: Secondary | ICD-10-CM | POA: Diagnosis not present

## 2017-01-03 DIAGNOSIS — I059 Rheumatic mitral valve disease, unspecified: Secondary | ICD-10-CM | POA: Diagnosis not present

## 2017-01-03 DIAGNOSIS — I058 Other rheumatic mitral valve diseases: Secondary | ICD-10-CM

## 2017-01-03 DIAGNOSIS — E119 Type 2 diabetes mellitus without complications: Secondary | ICD-10-CM | POA: Diagnosis not present

## 2017-01-03 NOTE — Patient Instructions (Signed)
Medication Instructions:  The current medical regimen is effective;  continue present plan and medications.  Testing/Procedures: Your physician has requested that you have an echocardiogram. Echocardiography is a painless test that uses sound waves to create images of your heart. It provides your doctor with information about the size and shape of your heart and how well your heart's chambers and valves are working. This procedure takes approximately one hour. There are no restrictions for this procedure.  Follow-Up: Follow up in 6 months with Dr. Skains.  You will receive a letter in the mail 2 months before you are due.  Please call us when you receive this letter to schedule your follow up appointment.  If you need a refill on your cardiac medications before your next appointment, please call your pharmacy.  Thank you for choosing [facility]!!       

## 2017-01-03 NOTE — Progress Notes (Signed)
 Cardiology Office Note    Date:  01/03/2017   ID:  Janet Williamson, DOB 10/30/1940, MRN [id number]  PCP:  [name],[name] COLLINS, MD  Cardiologist:   Mark Skains, MD     History of Present Illness:  Janet Williamson is a 76 y.o. female with moderate aortic stenosis, mitral annular calcification, prior "mass "on mitral valve of 1.3 cm with previous embolic stroke in 2008 currently on Plavix here for follow-up.  Cardiac catheterization in January 2011 showed no flow-limiting coronary artery disease. We have been monitoring the mass on her mitral valve/annulus. This could be indicative of mitral annular calcification.  Overall she's been feeling quite well. Her only main complaint is lower extremity edema which is mild. No significant shortness of breath, may be mild. No chest pain. No syncope.    Past Medical History:  Diagnosis Date  . Anemia   . Aortic stenosis    moderate AS 10/2012 echo (Dr. Skains)  . Cardiomyopathy in other disease   . Cataracts, bilateral   . Diabetes mellitus   . Elevated cholesterol   . Generalized osteoarthritis   . GERD (gastroesophageal reflux disease)   . Heart murmur   . Hypertension   . Mitral valve disorder   . Phlebitis   . Stroke ([facility])   . Thrombophlebitis     Past Surgical History:  Procedure Laterality Date  . ABDOMINAL HYSTERECTOMY    . EYE SURGERY     cataract removal  . TOTAL KNEE ARTHROPLASTY Right 04/14/2013   Dr Dean  . TOTAL KNEE ARTHROPLASTY Right 04/14/2013   Procedure: TOTAL KNEE ARTHROPLASTY;  Surgeon: Gregory Scott Dean, MD;  Location: [facility] OR;  Service: Orthopedics;  Laterality: Right;    Current Medications: Outpatient Medications Prior to Visit  Medication Sig Dispense Refill  . ACCU-CHEK SOFTCLIX LANCETS lancets     . amLODipine (NORVASC) 5 MG tablet Take 5 mg by mouth daily.    . aspirin 81 MG tablet Take 81 mg by mouth daily.    . Blood Glucose Monitoring Suppl (ACCU-CHEK AVIVA PLUS) w/Device KIT 1 each by  Does not apply route as directed.     . carboxymethylcellulose (REFRESH PLUS) 0.5 % SOLN Apply 1 drop to eye 2 (two) times daily as needed (for dry eyes).    . cholecalciferol (VITAMIN D) 1000 UNITS tablet Take 1,000 Units by mouth daily.    . clopidogrel (PLAVIX) 75 MG tablet Take 75 mg by mouth daily.    . GNP ALCOHOL SWABS 70 % PADS 1 each by Does not apply route as directed.     . hydrochlorothiazide (HYDRODIURIL) 25 MG tablet Take 25 mg by mouth daily.    . metoprolol succinate (TOPROL-XL) 50 MG 24 hr tablet Take 50 mg by mouth daily. Take with or immediately following a meal.    . ONE TOUCH ULTRA TEST test strip 1 each by Other route every other day.     . potassium chloride (KLOR-CON 10) 10 MEQ tablet Take 1 tablet (10 mEq total) by mouth daily. 30 tablet 0  . simvastatin (ZOCOR) 20 MG tablet Take 20 mg by mouth every evening.    . Calcium Carbonate (CALCIUM 600 PO) Take 1 tablet by mouth daily.    . metFORMIN (GLUCOPHAGE-XR) 750 MG 24 hr tablet TAKE 2 TABLETS EVERY DAY (Patient not taking: Reported on 01/03/2017) 180 tablet 0   No facility-administered medications prior to visit.      Allergies:   Ace inhibitors; Keflex [cephalexin]; Ramipril;   and Rifadin [rifampin]   Social History   Social History  . Marital status: Widowed    Spouse name: N/A  . Number of children: N/A  . Years of education: N/A   Social History Main Topics  . Smoking status: Never Smoker  . Smokeless tobacco: Never Used  . Alcohol use No  . Drug use: No  . Sexual activity: Not Currently   Other Topics Concern  . None   Social History Narrative  . None     Family History:  The patient's family history includes Hypertension in her father and mother; Stroke in her mother.   ROS:   Please see the history of present illness.    ROS All other systems reviewed and are negative.   PHYSICAL EXAM:   VS:  BP 140/68   Pulse 78   Ht 5' 2" (1.575 m)   Wt 202 lb 12.8 oz (92 kg)   BMI 37.09 kg/m      GEN: Well nourished, well developed, in no acute distress  HEENT: normal  Neck: no JVD, carotid bruits, or masses Cardiac: RRR; 3/6 SEM RUSB, no rubs, or gallops, 1+ BLE edema  Respiratory:  clear to auscultation bilaterally, normal work of breathing GI: soft, nontender, nondistended, + BS MS: no deformity or atrophy  Skin: warm and dry, no rash Neuro:  Alert and Oriented x 3, Strength and sensation are intact Psych: euthymic mood, full affect  Wt Readings from Last 3 Encounters:  01/03/17 202 lb 12.8 oz (92 kg)  12/25/16 201 lb (91.2 kg)  08/06/16 202 lb (91.6 kg)      Studies/Labs Reviewed:   EKG: .  05/30/16 EKG shows left bundle branch block, sinus rhythm. Personally viewed  Recent Labs: 04/03/2016: TSH 1.61 08/01/2016: ALT 9 12/18/2016: BUN 14; Creatinine, Ser 0.74; Potassium 3.9; Sodium 137   Lipid Panel    Component Value Date/Time   CHOL 130 04/03/2016 1053   TRIG 87.0 04/03/2016 1053   HDL 37.40 (L) 04/03/2016 1053   CHOLHDL 3 04/03/2016 1053   VLDL 17.4 04/03/2016 1053   LDLCALC 75 04/03/2016 1053    Additional studies/ records that were reviewed today include:   ECHO 06/07/16  - Left ventricle: The cavity size was normal. Wall thickness was   increased in a pattern of moderate LVH. Systolic function was   normal. The estimated ejection fraction was in the range of 60%   to 65%. Wall motion was normal; there were no regional wall   motion abnormalities. Doppler parameters are consistent with   abnormal left ventricular relaxation (grade 1 diastolic   dysfunction). - Aortic valve: There was moderate to severe stenosis. There was   mild regurgitation. Mean gradient (S): 32 mm Hg. Peak gradient   (S): 52 mm Hg. Valve area (VTI): 0.92 cm^2. - Mitral valve: Moderately calcified annulus. Mildly calcified   leaflets . The findings are consistent with mild stenosis. There   was trivial regurgitation. Mean gradient (D): 5 mm Hg. Valve area   by pressure  half-time: 1.79 cm^2. - Left atrium: The atrium was mildly dilated. - Right ventricle: The cavity size was normal. Systolic function   was normal. - Tricuspid valve: Peak RV-RA gradient (S): 36 mm Hg. - Pulmonary arteries: PA peak pressure: 39 mm Hg (S). - Inferior vena cava: The vessel was normal in size. The   respirophasic diameter changes were in the normal range (= 50%),   consistent with normal central venous pressure.  Impressions:  -   Normal LV size with moderate LV hypertrophy. EF 60-65%. Normal RV   size and systolic function. Severely calcified, trileaflet aortic   valve with mild regurgitation and moderate to severe stenosis   (moderate by mean gradient, severe by calculated valve area).   Mild mitral stenosis. Mild pulmonary hypertension.    ASSESSMENT:    1. Nonrheumatic aortic valve stenosis   2. Mitral valve mass   3. Diabetes mellitus with coincident hypertension ([facility])   4. History of stroke      PLAN:  In order of problems listed above:  Moderate aortic stenosis  - Repeat echocardiogram, prior mean gradient 32 mmHg  -Minimal shortness of breath  Mitral annular calcification/mass  - Previously described as mild mitral stenosis.  Hyperlipidemia  - LDL has been great. Continue with current therapy.  Diabetes with hypertension  - Prior hemoglobin A1c 6.9. Dr. Laurann Montana has been managing. Kelton Pillar.  History of stroke  - On Plavix.  Left bundle branch block  - Chronic, stable, no syncope.  Lower extremity edema  - Decrease salt, decrease fluids. Could be exacerbated as well by amlodipine. Encourage walking, weight loss  Medication Adjustments/Labs and Tests Ordered: Current medicines are reviewed at length with the patient today.  Concerns regarding medicines are outlined above.  Medication changes, Labs and Tests ordered today are listed in the Patient Instructions below. Patient Instructions  Medication Instructions:  The current medical  regimen is effective;  continue present plan and medications.  Testing/Procedures: Your physician has requested that you have an echocardiogram. Echocardiography is a painless test that uses sound waves to create images of your heart. It provides your doctor with information about the size and shape of your heart and how well your heart's chambers and valves are working. This procedure takes approximately one hour. There are no restrictions for this procedure.  Follow-Up: Follow up in 6 months with Dr. Marlou Porch.  You will receive a letter in the mail 2 months before you are due.  Please call us when you receive this letter to schedule your follow up appointment.  If you need a refill on your cardiac medications before your next appointment, please call your pharmacy.  Thank you for choosing [facility]!!        Signed, [name], MD  01/03/2017 9:19 AM    [facility] [place], [place], [place]  [address] Phone: [phone]; Fax: [phone]

## 2017-01-22 ENCOUNTER — Ambulatory Visit (HOSPITAL_COMMUNITY): Payer: Medicare Other | Attending: Cardiovascular Disease

## 2017-01-22 ENCOUNTER — Other Ambulatory Visit: Payer: Self-pay

## 2017-01-22 DIAGNOSIS — I352 Nonrheumatic aortic (valve) stenosis with insufficiency: Secondary | ICD-10-CM | POA: Diagnosis not present

## 2017-01-22 DIAGNOSIS — I517 Cardiomegaly: Secondary | ICD-10-CM | POA: Insufficient documentation

## 2017-01-22 DIAGNOSIS — I350 Nonrheumatic aortic (valve) stenosis: Secondary | ICD-10-CM

## 2017-01-22 DIAGNOSIS — I340 Nonrheumatic mitral (valve) insufficiency: Secondary | ICD-10-CM | POA: Insufficient documentation

## 2017-03-11 ENCOUNTER — Telehealth: Payer: Self-pay | Admitting: Endocrinology

## 2017-03-11 ENCOUNTER — Other Ambulatory Visit: Payer: Self-pay

## 2017-03-11 MED ORDER — ACCU-CHEK AVIVA PLUS W/DEVICE KIT: 1.0000 | PACK | 1 refills | Status: DC

## 2017-03-11 NOTE — Telephone Encounter (Signed)
Called patient to let her know that I sent in a new script for her meter to the [facility] on [address]

## 2017-03-11 NOTE — Telephone Encounter (Signed)
Can we please call in a rx for a new accucheck aviva plus meter hers is not working please call into [facility] on [address]

## 2017-04-16 DIAGNOSIS — M8580 Other specified disorders of bone density and structure, unspecified site: Secondary | ICD-10-CM | POA: Diagnosis not present

## 2017-04-16 DIAGNOSIS — Z23 Encounter for immunization: Secondary | ICD-10-CM | POA: Diagnosis not present

## 2017-04-16 DIAGNOSIS — E785 Hyperlipidemia, unspecified: Secondary | ICD-10-CM | POA: Diagnosis not present

## 2017-04-16 DIAGNOSIS — I639 Cerebral infarction, unspecified: Secondary | ICD-10-CM | POA: Diagnosis not present

## 2017-04-16 DIAGNOSIS — I350 Nonrheumatic aortic (valve) stenosis: Secondary | ICD-10-CM | POA: Diagnosis not present

## 2017-04-16 DIAGNOSIS — E1165 Type 2 diabetes mellitus with hyperglycemia: Secondary | ICD-10-CM | POA: Diagnosis not present

## 2017-04-16 DIAGNOSIS — Z1389 Encounter for screening for other disorder: Secondary | ICD-10-CM | POA: Diagnosis not present

## 2017-04-16 DIAGNOSIS — Z0000 Encounter for general adult medical examination without abnormal findings: Secondary | ICD-10-CM | POA: Diagnosis not present

## 2017-04-16 DIAGNOSIS — E042 Nontoxic multinodular goiter: Secondary | ICD-10-CM | POA: Diagnosis not present

## 2017-04-16 DIAGNOSIS — M1990 Unspecified osteoarthritis, unspecified site: Secondary | ICD-10-CM | POA: Diagnosis not present

## 2017-04-16 DIAGNOSIS — Z7984 Long term (current) use of oral hypoglycemic drugs: Secondary | ICD-10-CM | POA: Diagnosis not present

## 2017-04-16 DIAGNOSIS — I10 Essential (primary) hypertension: Secondary | ICD-10-CM | POA: Diagnosis not present

## 2017-04-23 ENCOUNTER — Other Ambulatory Visit (INDEPENDENT_AMBULATORY_CARE_PROVIDER_SITE_OTHER): Payer: Medicare Other

## 2017-04-23 DIAGNOSIS — E119 Type 2 diabetes mellitus without complications: Secondary | ICD-10-CM

## 2017-04-23 LAB — MICROALBUMIN / CREATININE URINE RATIO
Creatinine,U: 119.3 mg/dL
Microalb Creat Ratio: 0.6 mg/g (ref 0.0–30.0)
Microalb, Ur: <0.7 mg/dL (ref 0.0–1.9)

## 2017-04-23 LAB — BASIC METABOLIC PANEL
BUN: 13 mg/dL (ref 6–23)
CHLORIDE: 100 meq/L (ref 96–112)
CO2: 31 mEq/L (ref 19–32)
Calcium: 10.3 mg/dL (ref 8.4–10.5)
Creatinine, Ser: 0.76 mg/dL (ref 0.40–1.20)
GFR: 95.06 mL/min (ref >60.00)
Glucose, Bld: 114 mg/dL — ABNORMAL HIGH (ref 70–99)
POTASSIUM: 4.1 meq/L (ref 3.5–5.1)
SODIUM: 138 meq/L (ref 135–145)

## 2017-04-23 LAB — LIPID PANEL
CHOLESTEROL: 121 mg/dL (ref 0–200)
HDL: 38.1 mg/dL — ABNORMAL LOW (ref >39.00)
LDL CALC: 66 mg/dL (ref 0–99)
NonHDL: 82.86
TRIGLYCERIDES: 84 mg/dL (ref 0.0–149.0)
Total CHOL/HDL Ratio: 3
VLDL: 16.8 mg/dL (ref 0.0–40.0)

## 2017-04-23 LAB — HEMOGLOBIN A1C: HEMOGLOBIN A1C: 7.1 % — AB (ref 4.6–6.5)

## 2017-04-26 ENCOUNTER — Ambulatory Visit (INDEPENDENT_AMBULATORY_CARE_PROVIDER_SITE_OTHER): Payer: Medicare Other | Admitting: Endocrinology

## 2017-04-26 ENCOUNTER — Encounter: Payer: Self-pay | Admitting: Endocrinology

## 2017-04-26 VITALS — BP 136/74 | HR 69 | Ht 62.5 in | Wt 196.8 lb

## 2017-04-26 DIAGNOSIS — E119 Type 2 diabetes mellitus without complications: Secondary | ICD-10-CM

## 2017-04-26 DIAGNOSIS — E042 Nontoxic multinodular goiter: Secondary | ICD-10-CM | POA: Diagnosis not present

## 2017-04-26 DIAGNOSIS — I10 Essential (primary) hypertension: Secondary | ICD-10-CM | POA: Diagnosis not present

## 2017-04-26 DIAGNOSIS — E7800 Pure hypercholesterolemia, unspecified: Secondary | ICD-10-CM

## 2017-04-26 NOTE — Progress Notes (Signed)
Patient ID: Janet Williamson, female   DOB: 1941-05-03, 76 y.o.   MRN: [id number]   Reason for Appointment: Diabetes follow-up   History of Present Illness   Type 2 DIABETES MELITUS, date of diagnosis 02/2012     She has had mild diabetes which has been well controlled with metformin ER alone. Has had no side effects from metformin  She also has had diabetes education in 2013 . Oral hypoglycemic drugs: Metformin 1.5 g        Side effects from medications: None  Her A1c tends to be higher than expected for her blood sugars and is 7.1, previously 6.9%   Current management, blood sugar patterns and problems identified:  She has  brought her monitor for download   Currently checking sugars after supper and these are AVERAGING 126 with a range of 91-1 72  This morning it was 149, after lunch 109  She is not able to do much activity because of leg pains but her weight is down slightly  Overall trying to watch her portions and carbohydrates  Lab glucose was 114 fasting   Monitors blood glucose:  less than once a day     Glucometer:  Accu-Chek  Blood Glucose readings from DOWNLOAD as above AVERAGE 86     Previous visits with dietitian: 7 years ago        Meals: 3 meals per day. Dinner At 5-6 pm       Physical activity: exercise: none    Wt Readings from Last 3 Encounters:  04/26/17 196 lb 12.8 oz (89.3 kg)  01/03/17 202 lb 12.8 oz (92 kg)  12/25/16 201 lb (91.2 kg)     Lab Results  Component Value Date   HGBA1C 7.1 (H) 04/23/2017   HGBA1C 6.9 (H) 12/18/2016   HGBA1C 6.9 (H) 08/01/2016   Lab Results  Component Value Date   MICROALBUR <0.7 04/23/2017   Hemet 66 04/23/2017   CREATININE 0.76 04/23/2017     Other active problems evaluated today: See review of systems   LABS:  Appointment on 04/23/2017  Component Date Value Ref Range Status  . Hgb A1c MFr Bld 04/23/2017 7.1* 4.6 - 6.5 % Final   Glycemic Control Guidelines for People with Diabetes:Non  Diabetic:  <6%Goal of Therapy: <7%Additional Action Suggested:  >8%   . Sodium 04/23/2017 138  135 - 145 mEq/L Final  . Potassium 04/23/2017 4.1  3.5 - 5.1 mEq/L Final  . Chloride 04/23/2017 100  96 - 112 mEq/L Final  . CO2 04/23/2017 31  19 - 32 mEq/L Final  . Glucose, Bld 04/23/2017 114* 70 - 99 mg/dL Final  . BUN 04/23/2017 13  6 - 23 mg/dL Final  . Creatinine, Ser 04/23/2017 0.76  0.40 - 1.20 mg/dL Final  . Calcium 04/23/2017 10.3  8.4 - 10.5 mg/dL Final  . GFR 04/23/2017 95.06  >60.00 mL/min Final  . Cholesterol 04/23/2017 121  0 - 200 mg/dL Final   ATP III Classification       Desirable:  < 200 mg/dL               Borderline High:  200 - 239 mg/dL          High:  > = 240 mg/dL  . Triglycerides 04/23/2017 84.0  0.0 - 149.0 mg/dL Final   Normal:  <150 mg/dLBorderline High:  150 - 199 mg/dL  . HDL 04/23/2017 38.10* >39.00 mg/dL Final  . VLDL 04/23/2017 16.8  0.0 -  40.0 mg/dL Final  . LDL Cholesterol 04/23/2017 66  0 - 99 mg/dL Final  . Total CHOL/HDL Ratio 04/23/2017 3   Final                  Men          Women1/2 Average Risk     3.4          3.3Average Risk          5.0          4.42X Average Risk          9.6          7.13X Average Risk          15.0          11.0                      . NonHDL 04/23/2017 82.86   Final   NOTE:  Non-HDL goal should be 30 mg/dL higher than patient's LDL goal (i.e. LDL goal of < 70 mg/dL, would have non-HDL goal of < 100 mg/dL)  . Microalb, Ur 04/23/2017 <0.7  0.0 - 1.9 mg/dL Final  . Creatinine,U 04/23/2017 119.3  mg/dL Final  . Microalb Creat Ratio 04/23/2017 0.6  0.0 - 30.0 mg/g Final    Allergies as of 04/26/2017      Reactions   Ace Inhibitors    angioedema   Keflex [cephalexin] Swelling   lips   Ramipril Swelling   Rifadin [rifampin] Swelling   lips      Medication List       Accurate as of 04/26/17 11:00 AM. Always use your most recent med list.          ACCU-CHEK AVIVA PLUS w/Device Kit 1 each by Does not apply route as  directed.   ACCU-CHEK SOFTCLIX LANCETS lancets   amLODipine 5 MG tablet Commonly known as:  NORVASC Take 5 mg by mouth daily.   aspirin 81 MG tablet Take 81 mg by mouth daily.   carboxymethylcellulose 0.5 % Soln Commonly known as:  REFRESH PLUS Apply 1 drop to eye 2 (two) times daily as needed (for dry eyes).   cholecalciferol 1000 units tablet Commonly known as:  VITAMIN D Take 1,000 Units by mouth daily.   clopidogrel 75 MG tablet Commonly known as:  PLAVIX Take 75 mg by mouth daily.   GNP ALCOHOL SWABS 70 % Pads 1 each by Does not apply route as directed.   hydrochlorothiazide 25 MG tablet Commonly known as:  HYDRODIURIL Take 25 mg by mouth daily.   metFORMIN 750 MG 24 hr tablet Commonly known as:  GLUCOPHAGE-XR Take 1,500 mg by mouth daily with breakfast.   metoprolol succinate 50 MG 24 hr tablet Commonly known as:  TOPROL-XL Take 50 mg by mouth daily. Take with or immediately following a meal.   potassium chloride 10 MEQ tablet Commonly known as:  KLOR-CON 10 Take 1 tablet (10 mEq total) by mouth daily.   simvastatin 20 MG tablet Commonly known as:  ZOCOR Take 20 mg by mouth every evening.       Allergies:  Allergies  Allergen Reactions  . Ace Inhibitors     angioedema  . Keflex [Cephalexin] Swelling    lips  . Ramipril Swelling  . Rifadin [Rifampin] Swelling    lips    Past Medical History:  Diagnosis Date  . Anemia   . Aortic stenosis    moderate AS  10/2012 echo (Dr. Marlou Porch)  . Cardiomyopathy in other disease   . Cataracts, bilateral   . Diabetes mellitus   . Elevated cholesterol   . Generalized osteoarthritis   . GERD (gastroesophageal reflux disease)   . Heart murmur   . Hypertension   . Mitral valve disorder   . Phlebitis   . Stroke ([place])   . Thrombophlebitis     Past Surgical History:  Procedure Laterality Date  . ABDOMINAL HYSTERECTOMY    . EYE SURGERY     cataract removal  . TOTAL KNEE ARTHROPLASTY Right 04/14/2013   Dr  Marlou Sa  . TOTAL KNEE ARTHROPLASTY Right 04/14/2013   Procedure: TOTAL KNEE ARTHROPLASTY;  Surgeon: Meredith Pel, MD;  Location: [facility];  Service: Orthopedics;  Laterality: Right;    Family History  Problem Relation Age of Onset  . Stroke Mother   . Hypertension Mother   . Hypertension Father   . Heart attack Neg Hx     Social History:  reports that she has never smoked. She has never used smokeless tobacco. She reports that she does not drink alcohol or use drugs.  Review of Systems:  HYPERTENSION:  she has been managed with 5 mg amlodipine, 50 mg metoprolol and hydrochlorothiazide, followed by PCP. She was recommended 10 mg amlodipine on her last visit but is still taking 5 mg blood pressure is better today   She had swelling of the lips with ramipril and has not been prescribed an ARB   HYPERLIPIDEMIA: The lipid abnormality consists of elevated LDL treated with simvastatin 20 mg by PCP and LDL is controlled   Lab Results  Component Value Date   CHOL 121 04/23/2017   HDL 38.10 (L) 04/23/2017   LDLCALC 66 04/23/2017   TRIG 84.0 04/23/2017   CHOLHDL 3 04/23/2017    Multinodular goiter: She has had a long-standing multinodular goiter, last ultrasound was done in 05/2012 which showed a 4.1 cm nodule in the isthmus and other nodules She does not complain of any difficulty swallowing, local pressure or choking  TSH has been normal  Lab Results  Component Value Date   TSH 1.61 04/03/2016    No history of numbness or Paresthesia in her feet Foot exam done in 12 /16 by her PCP  Last eye exam was in 07/2016  HYPERCALCEMIA: This has been upper normal but in March it was 10.7 She was told to cut back on her calcium supplements Still taking HCTZ 25 mg No history of osteoporosis   Lab Results  Component Value Date   CALCIUM 10.3 04/23/2017   CALCIUM 10.7 (H) 12/18/2016   CALCIUM 10.2 08/01/2016   CALCIUM 9.9 04/03/2016   CALCIUM 10.5 12/29/2015    Lab Results    Component Value Date   CALCIUM 10.3 04/23/2017   CAION 1.30 04/15/2010     Examination: BP 136/74   Pulse 69   Ht 5' 2.5" (1.588 m)   Wt 196 lb 12.8 oz (89.3 kg)   SpO2 97%   BMI 35.42 kg/m   Body mass index is 35.42 kg/m.   She has a significantly large right thyroid lobe, about 3 times normal, relatively smooth and fleshy and extending laterally Trachea not deviated Her isthmus shows a large firm nodule, smooth and mobile No left lobe enlargement  No pedal edema present  ASSESSMENT/ PLAN:   Diabetes type 2  See history of present illness for detailed discussion of current diabetes management, blood sugar patterns and problems identified  Her A1c is again higher than expected for her home readings which are excellent usually with only sporadic high readings Considering her age and multiple medical problems her control is adequate on metformin alone She can continue this unchanged especially since her renal function is good and she is tolerating this well She is not able to be active with exercise because of musculoskeletal problems with has been able to lose weight recently consistent diet Discussed blood sugar targets at various times and to rotate when she checks her sugars  HYPERTENSION: Appears to be well controlled now Since she does not have microalbuminuria will not add an ARB drug as yet  GOITER: She has a multinodular goiter, long-standing and on exam appears to be about the same Right lobe does not show any clinical nodules, also no local pressure symptoms Will recheck TSH on next visit  HYPERCALCEMIA: This may not be related to hyperparathyroidism since calcium appears to be persistently high normal Only once calcium was above normal, may be better with stopping calcium supplements Since she is asymptomatic and no history of osteoporosis will continue monitoring Consider reducing HCTZ of calcium significantly higher   LIPIDS: Excellent control with current  dose of simvastatin  Total visit time for evaluation and management of multiple problems, review of records, labs, medications and counseling = 25 minutes   Janet Williamson 04/26/2017, 11:00 AM   Note: This office note was prepared with Estate agent. Any transcriptional errors that result from this process are unintentional.

## 2017-05-28 ENCOUNTER — Telehealth: Payer: Self-pay | Admitting: Endocrinology

## 2017-05-28 ENCOUNTER — Other Ambulatory Visit: Payer: Self-pay

## 2017-05-28 DIAGNOSIS — E119 Type 2 diabetes mellitus without complications: Secondary | ICD-10-CM | POA: Diagnosis not present

## 2017-05-28 MED ORDER — METFORMIN HCL ER 750 MG PO TB24: 1500.0000 mg | ORAL_TABLET | Freq: Every day | ORAL | 3 refills | Status: DC

## 2017-05-28 NOTE — Telephone Encounter (Signed)
2 tabs daily

## 2017-05-28 NOTE — Telephone Encounter (Signed)
Called patient and let her know that I have sent a prescription to be signed by Dr. Lucianne Muss and should go to pharmacy today and be filled for her.

## 2017-05-28 NOTE — Telephone Encounter (Signed)
MEDICATION: metFORMIN (GLUCOPHAGE-XR) 750 MG 24 hr tablet  PHARMACY: [id number] - [place], [place] - [address]    IS THIS A 90 DAY SUPPLY : yes  IS PATIENT OUT OF MEDICTAION: yes  IF NOT; HOW MUCH IS LEFT: n/a  LAST APPOINTMENT DATE:04/26/17  NEXT APPOINTMENT DATE:08/27/17  OTHER COMMENTS:    **Let patient know to contact pharmacy at the end of the day to make sure medication is ready. **  ** Please notify patient to allow 48-72 hours to process**  **Encourage patient to contact the pharmacy for refills or they can request refills through [facility]**

## 2017-05-28 NOTE — Telephone Encounter (Signed)
Please advise on dosage of Metformin. Previously getting from historical provider.

## 2017-07-06 ENCOUNTER — Emergency Department (HOSPITAL_COMMUNITY)
Admission: EM | Admit: 2017-07-06 | Discharge: 2017-07-06 | Disposition: A | Discharge status: Home / Self Care | Payer: Medicare Other | Attending: Emergency Medicine | Admitting: Emergency Medicine

## 2017-07-06 ENCOUNTER — Other Ambulatory Visit: Payer: Self-pay

## 2017-07-06 ENCOUNTER — Emergency Department (HOSPITAL_COMMUNITY): Payer: Medicare Other

## 2017-07-06 ENCOUNTER — Encounter (HOSPITAL_COMMUNITY): Payer: Self-pay | Admitting: Emergency Medicine

## 2017-07-06 DIAGNOSIS — R002 Palpitations: Secondary | ICD-10-CM | POA: Diagnosis not present

## 2017-07-06 DIAGNOSIS — Z7982 Long term (current) use of aspirin: Secondary | ICD-10-CM | POA: Insufficient documentation

## 2017-07-06 DIAGNOSIS — Z96651 Presence of right artificial knee joint: Secondary | ICD-10-CM | POA: Diagnosis not present

## 2017-07-06 DIAGNOSIS — E119 Type 2 diabetes mellitus without complications: Secondary | ICD-10-CM | POA: Diagnosis not present

## 2017-07-06 DIAGNOSIS — I10 Essential (primary) hypertension: Secondary | ICD-10-CM | POA: Diagnosis not present

## 2017-07-06 DIAGNOSIS — Z8673 Personal history of transient ischemic attack (TIA), and cerebral infarction without residual deficits: Secondary | ICD-10-CM | POA: Insufficient documentation

## 2017-07-06 DIAGNOSIS — Z79899 Other long term (current) drug therapy: Secondary | ICD-10-CM | POA: Diagnosis not present

## 2017-07-06 DIAGNOSIS — I480 Paroxysmal atrial fibrillation: Secondary | ICD-10-CM | POA: Diagnosis not present

## 2017-07-06 DIAGNOSIS — Z7984 Long term (current) use of oral hypoglycemic drugs: Secondary | ICD-10-CM | POA: Insufficient documentation

## 2017-07-06 DIAGNOSIS — I4891 Unspecified atrial fibrillation: Secondary | ICD-10-CM | POA: Diagnosis not present

## 2017-07-06 LAB — I-STAT TROPONIN, ED: TROPONIN I, POC: 0.01 ng/mL (ref 0.00–0.08)

## 2017-07-06 LAB — BASIC METABOLIC PANEL
ANION GAP: 11 (ref 5–15)
BUN: 11 mg/dL (ref 6–20)
CALCIUM: 10 mg/dL (ref 8.9–10.3)
CO2: 25 mmol/L (ref 22–32)
Chloride: 101 mmol/L (ref 101–111)
Creatinine, Ser: 0.78 mg/dL (ref 0.44–1.00)
GFR calc Af Amer: >60 mL/min (ref >60)
Glucose, Bld: 167 mg/dL — ABNORMAL HIGH (ref 65–99)
POTASSIUM: 3.2 mmol/L — AB (ref 3.5–5.1)
SODIUM: 137 mmol/L (ref 135–145)

## 2017-07-06 LAB — CBC
HEMATOCRIT: 35.7 % — AB (ref 36.0–46.0)
Hemoglobin: 11.2 g/dL — ABNORMAL LOW (ref 12.0–15.0)
MCH: 22.5 pg — ABNORMAL LOW (ref 26.0–34.0)
MCHC: 31.4 g/dL (ref 30.0–36.0)
MCV: 71.8 fL — ABNORMAL LOW (ref 78.0–100.0)
Platelets: 444 10*3/uL — ABNORMAL HIGH (ref 150–400)
RBC: 4.97 MIL/uL (ref 3.87–5.11)
RDW: 17.5 % — AB (ref 11.5–15.5)
WBC: 10.7 10*3/uL — AB (ref 4.0–10.5)

## 2017-07-06 MED ORDER — DILTIAZEM HCL 25 MG/5ML IV SOLN
15.0000 mg | Freq: Once | INTRAVENOUS | Status: AC
  Administered 2017-07-06: 15 mg via INTRAVENOUS

## 2017-07-06 MED ORDER — DILTIAZEM HCL 100 MG IV SOLR
5.0000 mg/h | INTRAVENOUS | Status: DC
  Administered 2017-07-06: 5 mg/h via INTRAVENOUS
  Filled 2017-07-06: qty 100

## 2017-07-06 MED ORDER — RIVAROXABAN 20 MG PO TABS: 20.0000 mg | ORAL_TABLET | Freq: Every day | ORAL | 0 refills | Status: DC

## 2017-07-06 MED ORDER — DILTIAZEM LOAD VIA INFUSION
15.0000 mg | Freq: Once | INTRAVENOUS | Status: AC
  Administered 2017-07-06: 15 mg via INTRAVENOUS
  Filled 2017-07-06: qty 15

## 2017-07-06 MED ORDER — METOPROLOL SUCCINATE ER 50 MG PO TB24
50.0000 mg | ORAL_TABLET | Freq: Every day | ORAL | Status: DC
  Administered 2017-07-06: 50 mg via ORAL
  Filled 2017-07-06: qty 1

## 2017-07-06 MED ORDER — ACETAMINOPHEN 500 MG PO TABS
1000.0000 mg | ORAL_TABLET | Freq: Once | ORAL | Status: AC
  Administered 2017-07-06: 1000 mg via ORAL
  Filled 2017-07-06: qty 2

## 2017-07-06 NOTE — ED Notes (Signed)
Pharmacy notified x 2 for cardizem, edp aware of waiting for it

## 2017-07-06 NOTE — ED Notes (Signed)
Pt complains of frontal lobe headache that is throbbing and aching, denies any other symptoms hr remains 146 and cardizem titrated up to 15mg /hr, edp made aware

## 2017-07-06 NOTE — ED Notes (Signed)
Patient placed on PureWick female external catheter. 

## 2017-07-06 NOTE — ED Notes (Signed)
EKG repeated, copy given to Dr. Patria Mane

## 2017-07-06 NOTE — ED Provider Notes (Signed)
[facility]-EMERGENCY DEPT Provider Note   CSN: [id number] Arrival date & time: 07/06/17  1031     History   Chief Complaint Chief Complaint  Patient presents with  . Palpitations  . Weakness    HPI Janet Williamson is a 76 y.o. female.  HPI Patient is a 76 year old female followed by Dr. Anne Fu of cardiology who presents to emergency department with acute onset palpitations this morning.  She's had them intermittently before in the past but this has been the most prolonged episode.  She also reports feeling generally weak with these.  No syncopal episode or presyncopal symptoms.  Denies chest pain or chest tightness at this time.  She can continue to feel the palpitations.  She presents in atrial fibrillation with a rate in the 140s.  She is on Plavix for history of embolic stroke.  She has known aortic stenosis and is being followed with annual echocardiograms.    Past Medical History:  Diagnosis Date  . Anemia   . Aortic stenosis    moderate AS 10/2012 echo (Dr. Anne Fu)  . Cardiomyopathy in other disease   . Cataracts, bilateral   . Diabetes mellitus   . Elevated cholesterol   . Generalized osteoarthritis   . GERD (gastroesophageal reflux disease)   . Heart murmur   . Hypertension   . Mitral valve disorder   . Phlebitis   . Stroke (HCC)   . Thrombophlebitis     Patient Active Problem List   Diagnosis Date Noted  . Mitral valve mass 11/03/2014  . History of stroke 11/03/2014  . Multinodular goiter 10/19/2013  . Hypertension   . Elevated cholesterol   . Cardiomyopathy in other disease   . Mitral valve disorder   . Heart murmur   . Angioedema 05/06/2013  . DM (diabetes mellitus) (HCC) 05/06/2013  . HTN (hypertension) 05/06/2013  . Anemia 05/06/2013  . Aortic stenosis 05/06/2013    Past Surgical History:  Procedure Laterality Date  . ABDOMINAL HYSTERECTOMY    . EYE SURGERY     cataract removal  . TOTAL KNEE ARTHROPLASTY Right 04/14/2013   Dr August Saucer  . TOTAL  KNEE ARTHROPLASTY Right 04/14/2013   Procedure: TOTAL KNEE ARTHROPLASTY;  Surgeon: Cammy Copa, MD;  Location: [facility] OR;  Service: Orthopedics;  Laterality: Right;    OB History    No data available       Home Medications    Prior to Admission medications   Medication Sig Start Date End Date Taking? Authorizing Provider  acetaminophen (TYLENOL) 500 MG tablet Take 500 mg by mouth every 6 (six) hours as needed for moderate pain.   Yes [provider]  amLODipine (NORVASC) 5 MG tablet Take 5 mg by mouth daily.   Yes [provider]  aspirin 81 MG tablet Take 81 mg by mouth daily.   Yes [provider]  carboxymethylcellulose (REFRESH PLUS) 0.5 % SOLN Place 1 drop into both eyes 2 (two) times daily as needed (for dry eyes).    Yes [provider]  cholecalciferol (VITAMIN D) 1000 UNITS tablet Take 1,000 Units by mouth daily.   Yes [provider]  clopidogrel (PLAVIX) 75 MG tablet Take 75 mg by mouth daily.   Yes [provider]  hydrochlorothiazide (HYDRODIURIL) 25 MG tablet Take 25 mg by mouth daily.   Yes [provider]  metFORMIN (GLUCOPHAGE-XR) 750 MG 24 hr tablet Take 2 tablets (1,500 mg total) by mouth daily with breakfast. 05/28/17  Yes Reather Littler,  MD  metoprolol succinate (TOPROL-XL) 50 MG 24 hr tablet Take 50 mg by mouth daily. Take with or immediately following a meal.   Yes [provider]  potassium chloride (KLOR-CON 10) 10 MEQ tablet Take 1 tablet (10 mEq total) by mouth daily. 04/06/16  Yes Reather Littler, MD  simvastatin (ZOCOR) 20 MG tablet Take 20 mg by mouth every evening.   Yes [provider]    Family History Family History  Problem Relation Age of Onset  . Stroke Mother   . Hypertension Mother   . Hypertension Father   . Heart attack Neg Hx     Social History Social History  Substance Use Topics  . Smoking status: Never Smoker  . Smokeless tobacco: Never Used  . Alcohol use No       Allergies   Ace inhibitors; Keflex [cephalexin]; Ramipril; and Rifadin [rifampin]   Review of Systems Review of Systems  All other systems reviewed and are negative.    Physical Exam Updated Vital Signs BP (!) 124/57   Pulse 81   Temp 98.5 F (36.9 C) (Oral)   Resp 17   Ht 5\' 3"  (1.6 m)   Wt 89.4 kg (197 lb)   SpO2 98%   BMI 34.90 kg/m   Physical Exam  Constitutional: She is oriented to person, place, and time. She appears well-developed and well-nourished. No distress.  HENT:  Head: Normocephalic and atraumatic.  Eyes: EOM are normal.  Neck: Normal range of motion.  Cardiovascular:  Tachycardia.  Irregularly irregular.  Systolic ejection murmur loudly heard  Pulmonary/Chest: Effort normal and breath sounds normal.  Abdominal: Soft. She exhibits no distension. There is no tenderness.  Musculoskeletal: Normal range of motion.  Neurological: She is alert and oriented to person, place, and time.  Skin: Skin is warm and dry.  Psychiatric: She has a normal mood and affect. Judgment normal.  Nursing note and vitals reviewed.    ED Treatments / Results  Labs (all labs ordered are listed, but only abnormal results are displayed) Labs Reviewed  BASIC METABOLIC PANEL - Abnormal; Notable for the following:       Result Value   Potassium 3.2 (*)    Glucose, Bld 167 (*)    All other components within normal limits  CBC - Abnormal; Notable for the following:    WBC 10.7 (*)    Hemoglobin 11.2 (*)    HCT 35.7 (*)    MCV 71.8 (*)    MCH 22.5 (*)    RDW 17.5 (*)    Platelets 444 (*)    All other components within normal limits  I-STAT TROPONIN, ED    EKG  EKG Interpretation  Date/Time:  Saturday July 06 2017 11:46:37 EDT Ventricular Rate:  140 PR Interval:    QRS Duration: 144 QT Interval:  345 QTC Calculation: 527 R Axis:   -28 Text Interpretation:  atrial fibrillation with rapid ventricular response Left bundle branch block changed from prior  Confirmed by Azalia Bilis ([id number]) on 07/06/2017 12:01:01 PM       ECG interpretation #2  Date: 07/06/2017  Rate: 86  Rhythm: normal sinus rhythm  QRS Axis: normal  Intervals: normal  ST/T Wave abnormalities: normal  Conduction Disutrbances: LBBB  Narrative Interpretation:   Old EKG Reviewed: afib resolved       Radiology Dg Chest 2 View  Result Date: 07/06/2017 CLINICAL DATA:  Acute heart palpitations today. EXAM: CHEST  2 VIEW COMPARISON:  07/04/2013 chest radiographs FINDINGS: Mild  cardiomegaly and prominent transverse aorta again noted. There is no evidence of focal airspace disease, pulmonary edema, suspicious pulmonary nodule/mass, pleural effusion, or pneumothorax. No acute bony abnormalities are identified. IMPRESSION: Mild cardiomegaly without evidence of acute cardiopulmonary disease. Electronically Signed   By: Harmon Pier M.D.   On: 07/06/2017 11:05    Procedures Procedures (including critical care time)  Medications Ordered in ED Medications  diltiazem (CARDIZEM) 1 mg/mL load via infusion 15 mg (15 mg Intravenous Bolus from Bag 07/06/17 1303)    And  diltiazem (CARDIZEM) 100 mg in dextrose 5 % 100 mL (1 mg/mL) infusion (0 mg/hr Intravenous Stopped 07/06/17 1546)  metoprolol succinate (TOPROL-XL) 24 hr tablet 50 mg (50 mg Oral Given 07/06/17 1618)  diltiazem (CARDIZEM) injection 15 mg (15 mg Intravenous Given 07/06/17 1356)  acetaminophen (TYLENOL) tablet 1,000 mg (1,000 mg Oral Given 07/06/17 1413)     Initial Impression / Assessment and Plan / ED Course  I have reviewed the triage vital signs and the nursing notes.  Pertinent labs & imaging results that were available during my care of the patient were reviewed by me and considered in my medical decision making (see chart for details).     4:26 PM Patient converted to normal sinus rhythm.  Patient will be discharged home to follow-up in the A. fib clinic in follow-up with her cardiologist.  Monitored off  Cardizem drip.  Given a dose of metoprolol in the emergency department.  She failed to take her dose this morning. Pt is on plavix.   CHA2DS2/VAS Stroke Risk Points  = 6  Patient likely will be taken off of her Plavix and started on Alec's.  Case discussed briefly with cardiology.  Plan for follow-up with cardiology in follow-up in A. fib clinic.  Patient understands return to the ER for new or worsening symptoms.        Final Clinical Impressions(s) / ED Diagnoses   Final diagnoses:  None    New Prescriptions New Prescriptions   No medications on file     Azalia Bilis, MD 07/06/17 [phone]

## 2017-07-06 NOTE — ED Triage Notes (Signed)
Pt. Stated, I started having palpitations this morning tha took longer to go away. I also had some weakness. No pain., and no other symptoms.

## 2017-07-06 NOTE — ED Provider Notes (Addendum)
  Physical Exam  BP (!) 124/57   Pulse 81   Temp 98.5 F (36.9 C) (Oral)   Resp 17   Ht 5\' 3"  (1.6 m)   Wt 89.4 kg (197 lb)   SpO2 98%   BMI 34.90 kg/m   Physical Exam  ED Course  Procedures  MDM  Assuming care of patient from Dr. Patria Mane   Patient in the ED for new onset AF, that converted to sinus rhythm. Pt on metoprolol. Pt has valvular disorder and she has hx of embolic strokes. Workup thus far shows no concerning lab results.  Important pending results are Cardiology recs  According to Dr. Patria Mane, plan is to d/c with appropriate anticoagulation plan.   Patient had no complains, no concerns from the nursing side. Will continue to monitor.    Derwood Kaplan, MD 07/06/17 1715   6:38 PM Dr. Virgina Organ recommends stopping plavix and starting xarelto for now and letting Afib clinic manage the meds at the next visit.   Derwood Kaplan, MD 07/06/17 [phone]

## 2017-07-06 NOTE — ED Notes (Signed)
Waiting cardizem from pharmacy

## 2017-07-06 NOTE — ED Notes (Signed)
ED Provider at bedside. 

## 2017-07-06 NOTE — Discharge Instructions (Signed)
Please stop the plavix and start xarelto. Please see the Afib clinic.

## 2017-07-06 NOTE — ED Notes (Signed)
Pt now in NSR repeat ekg completed and given to edp

## 2017-07-15 ENCOUNTER — Encounter (HOSPITAL_COMMUNITY): Payer: Self-pay | Admitting: Nurse Practitioner

## 2017-07-15 ENCOUNTER — Other Ambulatory Visit: Payer: Self-pay

## 2017-07-15 ENCOUNTER — Ambulatory Visit (HOSPITAL_COMMUNITY)
Admission: RE | Admit: 2017-07-15 | Discharge: 2017-07-15 | Disposition: A | Discharge status: Home / Self Care | Payer: Medicare Other | Source: Ambulatory Visit | Attending: Nurse Practitioner | Admitting: Nurse Practitioner

## 2017-07-15 VITALS — BP 132/64 | HR 72 | Ht 63.0 in | Wt 192.4 lb

## 2017-07-15 DIAGNOSIS — Z96651 Presence of right artificial knee joint: Secondary | ICD-10-CM | POA: Insufficient documentation

## 2017-07-15 DIAGNOSIS — I4891 Unspecified atrial fibrillation: Secondary | ICD-10-CM | POA: Diagnosis present

## 2017-07-15 DIAGNOSIS — I10 Essential (primary) hypertension: Secondary | ICD-10-CM | POA: Insufficient documentation

## 2017-07-15 DIAGNOSIS — I429 Cardiomyopathy, unspecified: Secondary | ICD-10-CM | POA: Insufficient documentation

## 2017-07-15 DIAGNOSIS — I480 Paroxysmal atrial fibrillation: Secondary | ICD-10-CM | POA: Diagnosis not present

## 2017-07-15 DIAGNOSIS — K219 Gastro-esophageal reflux disease without esophagitis: Secondary | ICD-10-CM | POA: Diagnosis not present

## 2017-07-15 DIAGNOSIS — Z79899 Other long term (current) drug therapy: Secondary | ICD-10-CM | POA: Diagnosis not present

## 2017-07-15 DIAGNOSIS — E119 Type 2 diabetes mellitus without complications: Secondary | ICD-10-CM | POA: Insufficient documentation

## 2017-07-15 DIAGNOSIS — E7800 Pure hypercholesterolemia, unspecified: Secondary | ICD-10-CM | POA: Insufficient documentation

## 2017-07-15 DIAGNOSIS — Z7982 Long term (current) use of aspirin: Secondary | ICD-10-CM | POA: Diagnosis not present

## 2017-07-15 DIAGNOSIS — M159 Polyosteoarthritis, unspecified: Secondary | ICD-10-CM | POA: Insufficient documentation

## 2017-07-15 DIAGNOSIS — Z7984 Long term (current) use of oral hypoglycemic drugs: Secondary | ICD-10-CM | POA: Diagnosis not present

## 2017-07-15 DIAGNOSIS — Z8673 Personal history of transient ischemic attack (TIA), and cerebral infarction without residual deficits: Secondary | ICD-10-CM | POA: Diagnosis not present

## 2017-07-15 DIAGNOSIS — Z7901 Long term (current) use of anticoagulants: Secondary | ICD-10-CM | POA: Insufficient documentation

## 2017-07-15 LAB — BASIC METABOLIC PANEL
Anion gap: 10 (ref 5–15)
BUN: 18 mg/dL (ref 6–20)
CO2: 28 mmol/L (ref 22–32)
CREATININE: 0.74 mg/dL (ref 0.44–1.00)
Calcium: 10.1 mg/dL (ref 8.9–10.3)
Chloride: 98 mmol/L — ABNORMAL LOW (ref 101–111)
GFR calc Af Amer: >60 mL/min (ref >60)
GLUCOSE: 89 mg/dL (ref 65–99)
POTASSIUM: 3.8 mmol/L (ref 3.5–5.1)
SODIUM: 136 mmol/L (ref 135–145)

## 2017-07-15 NOTE — Progress Notes (Addendum)
Primary Care Physician: Maurice Small, MD Referring Physician: ER f/u   Janet Williamson is a 76 y.o. female with a h/o AS,  prior stroke , HTN, DM, that presented to [facility] ER with palpitations and was found to be in Afib and converted to SR while there. Plavix was stopped and she was placed on xarelto 20 mg,crcl cal at 84.36, appropriately dosed.. She has not noted any further afib. K+ was low at 3.2 and I do not see it was replaced. She is on hctz and K+ daily which she says she takes. She is also on asa and I will check with Dr. Anne Fu to see if this can be stopped as well, now that she is on xarelto. She states that she may have has some afib in past.Echo was recently checked and AS was though to be stable. Her weight is down 5 lbs form ER visit. Some chronic swelling of the ankles, stable. No recent change in meds/health. She is not currently drinking large amounts of caffeine, alcohol or tobacco use.  Today, she denies symptoms of palpitations, chest pain, shortness of breath, orthopnea, PND, lower extremity edema, dizziness, presyncope, syncope, or neurologic sequela. The patient is tolerating medications without difficulties and is otherwise without complaint today.   Past Medical History:  Diagnosis Date  . Anemia   . Aortic stenosis    moderate AS 10/2012 echo (Dr. Anne Fu)  . Cardiomyopathy in other disease   . Cataracts, bilateral   . Diabetes mellitus   . Elevated cholesterol   . Generalized osteoarthritis   . GERD (gastroesophageal reflux disease)   . Heart murmur   . Hypertension   . Mitral valve disorder   . Phlebitis   . Stroke (HCC)   . Thrombophlebitis    Past Surgical History:  Procedure Laterality Date  . ABDOMINAL HYSTERECTOMY    . EYE SURGERY     cataract removal  . TOTAL KNEE ARTHROPLASTY Right 04/14/2013   Dr August Saucer  . TOTAL KNEE ARTHROPLASTY Right 04/14/2013   Procedure: TOTAL KNEE ARTHROPLASTY;  Surgeon: Cammy Copa, MD;  Location: [facility] OR;  Service:  Orthopedics;  Laterality: Right;    Current Outpatient Prescriptions  Medication Sig Dispense Refill  . acetaminophen (TYLENOL) 500 MG tablet Take 500 mg by mouth every 6 (six) hours as needed for moderate pain.    Marland Kitchen amLODipine (NORVASC) 5 MG tablet Take 5 mg by mouth daily.    Marland Kitchen aspirin 81 MG tablet Take 81 mg by mouth daily.    . carboxymethylcellulose (REFRESH PLUS) 0.5 % SOLN Place 1 drop into both eyes 2 (two) times daily as needed (for dry eyes).     . cholecalciferol (VITAMIN D) 1000 UNITS tablet Take 1,000 Units by mouth daily.    . hydrochlorothiazide (HYDRODIURIL) 25 MG tablet Take 25 mg by mouth daily.    . metFORMIN (GLUCOPHAGE-XR) 750 MG 24 hr tablet Take 2 tablets (1,500 mg total) by mouth daily with breakfast. 60 tablet 3  . metoprolol succinate (TOPROL-XL) 50 MG 24 hr tablet Take 50 mg by mouth daily. Take with or immediately following a meal.    . potassium chloride (KLOR-CON 10) 10 MEQ tablet Take 1 tablet (10 mEq total) by mouth daily. 30 tablet 0  . rivaroxaban (XARELTO) 20 MG TABS tablet Take 1 tablet (20 mg total) by mouth daily with supper. 30 tablet 0  . simvastatin (ZOCOR) 20 MG tablet Take 20 mg by mouth every evening.     No  current facility-administered medications for this encounter.     Allergies  Allergen Reactions  . Ace Inhibitors     angioedema  . Keflex [Cephalexin] Swelling    lips  . Ramipril Swelling  . Rifadin [Rifampin] Swelling    lips    Social History   Social History  . Marital status: Widowed    Spouse name: N/A  . Number of children: N/A  . Years of education: N/A   Occupational History  . Not on file.   Social History Main Topics  . Smoking status: Never Smoker  . Smokeless tobacco: Never Used  . Alcohol use No  . Drug use: No  . Sexual activity: Not Currently   Other Topics Concern  . Not on file   Social History Narrative  . No narrative on file    Family History  Problem Relation Age of Onset  . Stroke Mother    . Hypertension Mother   . Hypertension Father   . Heart attack Neg Hx     ROS- All systems are reviewed and negative except as per the HPI above  Physical Exam: Vitals:   07/15/17 1529  BP: 132/64  Pulse: 72  Weight: 192 lb 6.4 oz (87.3 kg)  Height: 5\' 3"  (1.6 m)   Wt Readings from Last 3 Encounters:  07/15/17 192 lb 6.4 oz (87.3 kg)  07/06/17 197 lb (89.4 kg)  04/26/17 196 lb 12.8 oz (89.3 kg)    Labs: Lab Results  Component Value Date   NA 137 07/06/2017   K 3.2 (L) 07/06/2017   CL 101 07/06/2017   CO2 25 07/06/2017   GLUCOSE 167 (H) 07/06/2017   BUN 11 07/06/2017   CREATININE 0.78 07/06/2017   CALCIUM 10.0 07/06/2017   Lab Results  Component Value Date   INR 1.10 07/14/2013   Lab Results  Component Value Date   CHOL 121 04/23/2017   HDL 38.10 (L) 04/23/2017   LDLCALC 66 04/23/2017   TRIG 84.0 04/23/2017     GEN- The patient is well appearing, alert and oriented x 3 today.   Head- normocephalic, atraumatic Eyes-  Sclera clear, conjunctiva pink Ears- hearing intact Oropharynx- clear Neck- supple, no JVP Lymph- no cervical lymphadenopathy Lungs- Clear to ausculation bilaterally, normal work of breathing Heart- Regular rate and rhythm, no murmurs, rubs or gallops, PMI not laterally displaced GI- soft, NT, ND, + BS Extremities- no clubbing, cyanosis, or edema MS- no significant deformity or atrophy Skin- no rash or lesion Psych- euthymic mood, full affect Neuro- strength and sensation are intact  EKG- NSR at 72 bpm, pr int 170 ms, qrs int 146 ms, qtc 486 ms,  Epic records reviewed Echo -Left ventricle: The cavity size was normal. There was mild   concentric hypertrophy. Systolic function was normal. The   estimated ejection fraction was in the range of 55% to 60%. Wall   motion was normal; there were no regional wall motion   abnormalities. Doppler parameters are consistent with abnormal   left ventricular relaxation (grade 1 diastolic  dysfunction).   Doppler parameters are consistent with high ventricular filling   pressure. - Aortic valve: Valve mobility was restricted. There was moderate   stenosis. There was moderate regurgitation. Peak velocity (S):   347 cm/s. Mean gradient (S): 30 mm Hg. Valve area (VTI): 0.91   cm^2. Valve area (Vmax): 0.9 cm^2. Valve area (Vmean): 0.89 cm^2.   Regurgitation pressure half-time: 217 ms. - Mitral valve: Calcified annulus. The findings are  consistent with   mild stenosis. There was trivial regurgitation. Valve area by   continuity equation (using LVOT flow): 1.92 cm^2. - Left atrium: The atrium was moderately dilated. - Right ventricle: The cavity size was normal. Wall thickness was   normal. Systolic function was normal. - Atrial septum: No defect or patent foramen ovale was identified   by color flow Doppler. - Tricuspid valve: There was mild regurgitation. - Pulmonary arteries: PA peak pressure: 36 mm Hg (S).   Assessment and Plan: 1. Paroxysmal afib General education of afib Off Plavix in ER Xarelto started in the ER with a chadsvasc score of at least 7 I will message Dr. Anne Fu and see if we can also stop asa to reduce her bleeding risk. Bleeding precautions discussed Continue metoprolol for encourage of SR/rate control  Repeat BMET to see if K+ still needs replacement  afib clinic as needed  10/10- I heard back from Dr, Anne Fu that ASA can be stopped  and pt was informed .  Elvina Sidle Matthew Folks [facility] [address] [place], [place] [address] [phone]   F/u with Dr. Anne Fu in one month

## 2017-07-17 NOTE — Addendum Note (Signed)
Encounter addended by: Newman Nip, NP on: 07/17/2017 12:38 PM<BR>    Actions taken: Sign clinical note

## 2017-08-20 ENCOUNTER — Ambulatory Visit (INDEPENDENT_AMBULATORY_CARE_PROVIDER_SITE_OTHER): Payer: Medicare Other | Admitting: Cardiology

## 2017-08-20 ENCOUNTER — Encounter: Payer: Self-pay | Admitting: Cardiology

## 2017-08-20 VITALS — BP 138/60 | HR 71 | Ht 64.0 in | Wt 193.4 lb

## 2017-08-20 DIAGNOSIS — I10 Essential (primary) hypertension: Secondary | ICD-10-CM

## 2017-08-20 DIAGNOSIS — I480 Paroxysmal atrial fibrillation: Secondary | ICD-10-CM

## 2017-08-20 DIAGNOSIS — I350 Nonrheumatic aortic (valve) stenosis: Secondary | ICD-10-CM

## 2017-08-20 DIAGNOSIS — Z8673 Personal history of transient ischemic attack (TIA), and cerebral infarction without residual deficits: Secondary | ICD-10-CM | POA: Diagnosis not present

## 2017-08-20 NOTE — Patient Instructions (Signed)
Medication Instructions:  The current medical regimen is effective;  continue present plan and medications.  Follow-Up: Follow up in 6 months with Lori Gerhardt, NP.  You will receive a letter in the mail 2 months before you are due.  Please call us when you receive this letter to schedule your follow up appointment.  Follow up in 1 year with Dr. Skains.  You will receive a letter in the mail 2 months before you are due.  Please call us when you receive this letter to schedule your follow up appointment.  If you need a refill on your cardiac medications before your next appointment, please call your pharmacy.  Thank you for choosing [facility]!!     

## 2017-08-20 NOTE — Progress Notes (Signed)
Cardiology Office Note    Date:  08/20/2017   ID:  Janet Williamson, DOB 12/03/1940, MRN [id number]  PCP:  Maurice SmallGriffin, [name], MD  Cardiologist:   Donato SchultzMark Monque Haggar, MD     History of Present Illness:  Janet Williamson is a 76 y.o. female with moderate aortic stenosis, mitral annular calcification, prior "mass "on mitral valve of 1.3 cm with previous embolic stroke in 2008 currently on Plavix with recent paroxysmal atrial fibrillation 06/2017 status post cardioversion in ER and Xarelto here for follow-up.  Cardiac catheterization in January 2011 showed no flow-limiting coronary artery disease. We have been monitoring the mass on her mitral valve/annulus. This could be indicative of mitral annular calcification.  Overall feeling well post cardioversion.  No changes.  No bleeding.  No syncope.   Past Medical History:  Diagnosis Date  . Anemia   . Aortic stenosis    moderate AS 10/2012 echo (Dr. Anne FuSkains)  . Cardiomyopathy in other disease   . Cataracts, bilateral   . Diabetes mellitus   . Elevated cholesterol   . Generalized osteoarthritis   . GERD (gastroesophageal reflux disease)   . Heart murmur   . Hypertension   . Mitral valve disorder   . Phlebitis   . Stroke (HCC)   . Thrombophlebitis     Past Surgical History:  Procedure Laterality Date  . ABDOMINAL HYSTERECTOMY    . EYE SURGERY     cataract removal  . TOTAL KNEE ARTHROPLASTY Right 04/14/2013   Dr August Saucerean    Current Medications: Outpatient Medications Prior to Visit  Medication Sig Dispense Refill  . acetaminophen (TYLENOL) 500 MG tablet Take 500 mg by mouth every 6 (six) hours as needed for moderate pain.    Marland Kitchen. amLODipine (NORVASC) 5 MG tablet Take 5 mg by mouth daily.    . carboxymethylcellulose (REFRESH PLUS) 0.5 % SOLN Place 1 drop into both eyes 2 (two) times daily as needed (for dry eyes).     . cholecalciferol (VITAMIN D) 1000 UNITS tablet Take 1,000 Units by mouth daily.    . hydrochlorothiazide (HYDRODIURIL) 25  MG tablet Take 25 mg by mouth daily.    . metFORMIN (GLUCOPHAGE-XR) 750 MG 24 hr tablet Take 2 tablets (1,500 mg total) by mouth daily with breakfast. 60 tablet 3  . metoprolol succinate (TOPROL-XL) 50 MG 24 hr tablet Take 50 mg by mouth daily. Take with or immediately following a meal.    . potassium chloride (KLOR-CON 10) 10 MEQ tablet Take 1 tablet (10 mEq total) by mouth daily. 30 tablet 0  . rivaroxaban (XARELTO) 20 MG TABS tablet Take 1 tablet (20 mg total) by mouth daily with supper. 30 tablet 0  . simvastatin (ZOCOR) 20 MG tablet Take 20 mg by mouth every evening.    Marland Kitchen. aspirin 81 MG tablet Take 81 mg by mouth daily.     No facility-administered medications prior to visit.      Allergies:   Ace inhibitors; Keflex [cephalexin]; Ramipril; and Rifadin [rifampin]   Social History   Socioeconomic History  . Marital status: Widowed    Spouse name: None  . Number of children: None  . Years of education: None  . Highest education level: None  Social Needs  . Financial resource strain: None  . Food insecurity - worry: None  . Food insecurity - inability: None  . Transportation needs - medical: None  . Transportation needs - non-medical: None  Occupational History  . None  Tobacco Use  .  Smoking status: Never Smoker  . Smokeless tobacco: Never Used  Substance and Sexual Activity  . Alcohol use: No  . Drug use: No  . Sexual activity: Not Currently  Other Topics Concern  . None  Social History Narrative  . None     Family History:  The patient's family history includes Hypertension in her father and mother; Stroke in her mother.   ROS:   Please see the history of present illness.    Review of Systems  All other systems reviewed and are negative.    PHYSICAL EXAM:   VS:  BP 138/60   Pulse 71   Ht 5\' 4"  (1.626 m)   Wt 193 lb 6.4 oz (87.7 kg)   BMI 33.20 kg/m    GEN: Well nourished, well developed, in no acute distress  HEENT: normal  Neck: no JVD, carotid bruits,  or masses Cardiac: RRR; 3/6 SM, no rubs, or gallops,no edema  Respiratory:  clear to auscultation bilaterally, normal work of breathing GI: soft, nontender, nondistended, + BS MS: no deformity or atrophy  Skin: warm and dry, no rash Neuro:  Alert and Oriented x 3, Strength and sensation are intact Psych: euthymic mood, full affect   Wt Readings from Last 3 Encounters:  08/20/17 193 lb 6.4 oz (87.7 kg)  07/15/17 192 lb 6.4 oz (87.3 kg)  07/06/17 197 lb (89.4 kg)      Studies/Labs Reviewed:   EKG: .  06-06-2016 EKG shows left bundle branch block, sinus rhythm. Personally viewed  Recent Labs: 07/06/2017: Hemoglobin 11.2; Platelets 444 07/15/2017: BUN 18; Creatinine, Ser 0.74; Potassium 3.8; Sodium 136   Lipid Panel    Component Value Date/Time   CHOL 121 04/23/2017 1022   TRIG 84.0 04/23/2017 1022   HDL 38.10 (L) 04/23/2017 1022   CHOLHDL 3 04/23/2017 1022   VLDL 16.8 04/23/2017 1022   LDLCALC 66 04/23/2017 1022    Additional studies/ records that were reviewed today include:   ECHO 01/22/17  - Left ventricle: The cavity size was normal. There was mild   concentric hypertrophy. Systolic function was normal. The   estimated ejection fraction was in the range of 55% to 60%. Wall   motion was normal; there were no regional wall motion   abnormalities. Doppler parameters are consistent with abnormal   left ventricular relaxation (grade 1 diastolic dysfunction).   Doppler parameters are consistent with high ventricular filling   pressure. - Aortic valve: Valve mobility was restricted. There was moderate   stenosis. There was moderate regurgitation. Peak velocity (S):   347 cm/s. Mean gradient (S): 30 mm Hg. Valve area (VTI): 0.91   cm^2. Valve area (Vmax): 0.9 cm^2. Valve area (Vmean): 0.89 cm^2.   Regurgitation pressure half-time: 217 ms. - Mitral valve: Calcified annulus. The findings are consistent with   mild stenosis. There was trivial regurgitation. Valve area by    continuity equation (using LVOT flow): 1.92 cm^2. - Left atrium: The atrium was moderately dilated. - Right ventricle: The cavity size was normal. Wall thickness was   normal. Systolic function was normal. - Atrial septum: No defect or patent foramen ovale was identified   by color flow Doppler. - Tricuspid valve: There was mild regurgitation. - Pulmonary arteries: PA peak pressure: 36 mm Hg (S).   ASSESSMENT:    1. Paroxysmal atrial fibrillation (HCC)   2. Nonrheumatic aortic valve stenosis   3. History of stroke   4. Essential hypertension      PLAN:  In  order of problems listed above:  Paroxysmal atrial fibrillation  -Successfully cardioverted in the emergency department on 07/06/17 with Dr. Patria Mane.  Xarelto started.  I agree that we should go with Xarelto only.  This will help to reduce bleeding risk.  Moderate aortic stenosis  - Repeat echocardiogram, prior mean gradient 32 mmHg  -Minimal shortness of breath, not severe as of yet.  Mitral annular calcification/mass  - Previously described as mild mitral stenosis.  No changes  Hyperlipidemia  - LDL has been great. Continue with current therapy.  No changes made.  LDL 66.  Diabetes with hypertension  - Prior hemoglobin A1c 7.1. Dr. Maurice Small has been managing.   History of stroke  - changing to Xarelto only.   Left bundle branch block  - Chronic, stable, no syncope.  Lower extremity edema  - Decrease salt, decrease fluids. Could be exacerbated as well by amlodipine. Encourage walking, weight loss. Stable.  Medication Adjustments/Labs and Tests Ordered: Current medicines are reviewed at length with the patient today.  Concerns regarding medicines are outlined above.  Medication changes, Labs and Tests ordered today are listed in the Patient Instructions below. Patient Instructions  Medication Instructions:  The current medical regimen is effective;  continue present plan and medications.  Follow-Up: Follow  up in 6 months with Norma Fredrickson, NP.  You will receive a letter in the mail 2 months before you are due.  Please call us when you receive this letter to schedule your follow up appointment.  Follow up in 1 year with Dr. Anne Fu.  You will receive a letter in the mail 2 months before you are due.  Please call us when you receive this letter to schedule your follow up appointment.  If you need a refill on your cardiac medications before your next appointment, please call your pharmacy.  Thank you for choosing [facility]!!        Signed, Donato Schultz, MD  08/20/2017 12:34 PM    [facility] [address] [place], [place], [place]  [address] Phone: [phone]; Fax: [phone]

## 2017-08-22 ENCOUNTER — Other Ambulatory Visit (INDEPENDENT_AMBULATORY_CARE_PROVIDER_SITE_OTHER): Payer: Medicare Other

## 2017-08-22 DIAGNOSIS — E119 Type 2 diabetes mellitus without complications: Secondary | ICD-10-CM

## 2017-08-22 LAB — HEMOGLOBIN A1C: Hgb A1c MFr Bld: 6.8 % — ABNORMAL HIGH (ref 4.6–6.5)

## 2017-08-22 LAB — BASIC METABOLIC PANEL
BUN: 13 mg/dL (ref 6–23)
CO2: 34 meq/L — AB (ref 19–32)
Calcium: 10.2 mg/dL (ref 8.4–10.5)
Chloride: 98 mEq/L (ref 96–112)
Creatinine, Ser: 0.77 mg/dL (ref 0.40–1.20)
GFR: 93.56 mL/min (ref >60.00)
GLUCOSE: 108 mg/dL — AB (ref 70–99)
POTASSIUM: 4.1 meq/L (ref 3.5–5.1)
SODIUM: 137 meq/L (ref 135–145)

## 2017-08-23 LAB — PARATHYROID HORMONE, INTACT (NO CA): PTH: 23 pg/mL (ref 15–65)

## 2017-08-26 ENCOUNTER — Other Ambulatory Visit: Payer: Self-pay | Admitting: *Deleted

## 2017-08-26 MED ORDER — RIVAROXABAN 20 MG PO TABS: 20.0000 mg | ORAL_TABLET | Freq: Every day | ORAL | 11 refills | Status: DC

## 2017-08-27 ENCOUNTER — Ambulatory Visit (INDEPENDENT_AMBULATORY_CARE_PROVIDER_SITE_OTHER): Payer: Medicare Other | Admitting: Endocrinology

## 2017-08-27 ENCOUNTER — Encounter: Payer: Self-pay | Admitting: Endocrinology

## 2017-08-27 VITALS — BP 134/80 | HR 65 | Ht 62.5 in | Wt 193.6 lb

## 2017-08-27 DIAGNOSIS — E119 Type 2 diabetes mellitus without complications: Secondary | ICD-10-CM

## 2017-08-27 DIAGNOSIS — Z23 Encounter for immunization: Secondary | ICD-10-CM | POA: Diagnosis not present

## 2017-08-27 NOTE — Progress Notes (Signed)
Patient ID: Janet Williamson, female   DOB: 08/22/1941, 76 y.o.   MRN: [id number]   Reason for Appointment: Diabetes follow-up   History of Present Illness   Type 2 DIABETES MELITUS, date of diagnosis 02/2012     She has had mild diabetes which has been well controlled with metformin ER alone. Has had no side effects from metformin  She also has had diabetes education in 2013 . Oral hypoglycemic drugs: Metformin 1.5 g        Side effects from medications: None  Her A1c tends to be higher than expected for her blood sugars and is 6.8 and similar to previous values this year   Current management, blood sugar patterns and problems identified:  She has  brought her monitor for download   Her blood sugars are excellent but she is checking them mostly about 1 or 2 hours after evening meal with highest reading only 128  Her lab glucose was 100 and 8 in the morning  She still has difficulty losing weight and has limited mobility  Usually trying to watch her portions and carbohydrates No side effects with her metformin regimen  Monitors blood glucose:  less than once a day     Glucometer:  Accu-Chek  Blood Glucose readings from DOWNLOAD as follows: AVERAGE 107 with a range of 96-1 28     Previous visits with dietitian: Several years ago Meals: 3 meals per day. Dinner At 5-6 pm       Physical activity: exercise: some   walking  Wt Readings from Last 3 Encounters:  08/27/17 193 lb 9.6 oz (87.8 kg)  08/20/17 193 lb 6.4 oz (87.7 kg)  07/15/17 192 lb 6.4 oz (87.3 kg)     Lab Results  Component Value Date   HGBA1C 6.8 (H) 08/22/2017   HGBA1C 7.1 (H) 04/23/2017   HGBA1C 6.9 (H) 12/18/2016   Lab Results  Component Value Date   MICROALBUR <0.7 04/23/2017   LDLCALC 66 04/23/2017   CREATININE 0.77 08/22/2017     Other active problems evaluated today: See review of systems   LABS:  Lab on 08/22/2017  Component Date Value Ref Range Status  . PTH 08/22/2017 23  15  - 65 pg/mL Final  . Sodium 08/22/2017 137  135 - 145 mEq/L Final  . Potassium 08/22/2017 4.1  3.5 - 5.1 mEq/L Final  . Chloride 08/22/2017 98  96 - 112 mEq/L Final  . CO2 08/22/2017 34* 19 - 32 mEq/L Final  . Glucose, Bld 08/22/2017 108* 70 - 99 mg/dL Final  . BUN 65/99/3570 13  6 - 23 mg/dL Final  . Creatinine, Ser 08/22/2017 0.77  0.40 - 1.20 mg/dL Final  . Calcium 17/79/3903 10.2  8.4 - 10.5 mg/dL Final  . GFR 00/92/3300 93.56  >60.00 mL/min Final  . Hgb A1c MFr Bld 08/22/2017 6.8* 4.6 - 6.5 % Final   Glycemic Control Guidelines for People with Diabetes:Non Diabetic:  <6%Goal of Therapy: <7%Additional Action Suggested:  >8%     Allergies as of 08/27/2017      Reactions   Ace Inhibitors    angioedema   Keflex [cephalexin] Swelling   lips   Ramipril Swelling   Rifadin [rifampin] Swelling   lips      Medication List        Accurate as of 08/27/17 11:04 AM. Always use your most recent med list.          acetaminophen 500 MG tablet Commonly known  as:  TYLENOL Take 500 mg by mouth every 6 (six) hours as needed for moderate pain.   amLODipine 5 MG tablet Commonly known as:  NORVASC Take 5 mg by mouth daily.   aspirin 81 MG tablet Take 81 mg by mouth daily.   carboxymethylcellulose 0.5 % Soln Commonly known as:  REFRESH PLUS Place 1 drop into both eyes 2 (two) times daily as needed (for dry eyes).   cholecalciferol 1000 units tablet Commonly known as:  VITAMIN D Take 1,000 Units by mouth daily.   hydrochlorothiazide 25 MG tablet Commonly known as:  HYDRODIURIL Take 25 mg by mouth daily.   metFORMIN 750 MG 24 hr tablet Commonly known as:  GLUCOPHAGE-XR Take 2 tablets (1,500 mg total) by mouth daily with breakfast.   metoprolol succinate 50 MG 24 hr tablet Commonly known as:  TOPROL-XL Take 50 mg by mouth daily. Take with or immediately following a meal.   potassium chloride 10 MEQ tablet Commonly known as:  KLOR-CON 10 Take 1 tablet (10 mEq total) by mouth  daily.   rivaroxaban 20 MG Tabs tablet Commonly known as:  XARELTO Take 1 tablet (20 mg total) daily with supper by mouth.   simvastatin 20 MG tablet Commonly known as:  ZOCOR Take 20 mg by mouth every evening.       Allergies:  Allergies  Allergen Reactions  . Ace Inhibitors     angioedema  . Keflex [Cephalexin] Swelling    lips  . Ramipril Swelling  . Rifadin [Rifampin] Swelling    lips    Past Medical History:  Diagnosis Date  . Anemia   . Aortic stenosis    moderate AS 10/2012 echo (Dr. Anne Fu)  . Cardiomyopathy in other disease   . Cataracts, bilateral   . Diabetes mellitus   . Elevated cholesterol   . Generalized osteoarthritis   . GERD (gastroesophageal reflux disease)   . Heart murmur   . Hypertension   . Mitral valve disorder   . Phlebitis   . Stroke (HCC)   . Thrombophlebitis     Past Surgical History:  Procedure Laterality Date  . ABDOMINAL HYSTERECTOMY    . EYE SURGERY     cataract removal  . TOTAL KNEE ARTHROPLASTY Right 04/14/2013   Dr August Saucer  . TOTAL KNEE ARTHROPLASTY Right 04/14/2013   Performed by Cammy Copa, MD at [facility] OR    Family History  Problem Relation Age of Onset  . Stroke Mother   . Hypertension Mother   . Hypertension Father   . Heart attack Neg Hx     Social History:  reports that  has never smoked. she has never used smokeless tobacco. She reports that she does not drink alcohol or use drugs.  Review of Systems:  HYPERTENSION:  she has been managed with 5 mg amlodipine, 50 mg metoprolol and hydrochlorothiazide, followed by PCP. Has good control   She had swelling of the lips with ramipril and has not been prescribed an ARB   HYPERLIPIDEMIA: The lipid abnormality consists of elevated LDL treated with simvastatin 20 mg by PCP and LDL is controlled as follows   Lab Results  Component Value Date   CHOL 121 04/23/2017   HDL 38.10 (L) 04/23/2017   LDLCALC 66 04/23/2017   TRIG 84.0 04/23/2017   CHOLHDL 3  04/23/2017    Multinodular goiter: She has had a long-standing multinodular goiter, last ultrasound was done in 05/2012 which showed a 4.1 cm nodule in the isthmus and  other nodules She does not complain of any difficulty swallowing, local pressure symptoms and her exam has been stable  TSH has been normal  Lab Results  Component Value Date   TSH 1.61 04/03/2016    No history of numbness or Paresthesia in her feet Foot exam done in 04/2017 by her PCP  Last eye exam was in 07/2016  HYPERCALCEMIA: This has been upper normal fairly consistently but in March it was 10.7 She was told to stop her calcium supplements Still taking HCTZ 25 mg for hypertension No history of osteoporosis   Lab Results  Component Value Date   CALCIUM 10.2 08/22/2017   CALCIUM 10.1 07/15/2017   CALCIUM 10.0 07/06/2017   CALCIUM 10.3 04/23/2017   CALCIUM 10.7 (H) 12/18/2016    Lab Results  Component Value Date   PTH 23 08/22/2017   CALCIUM 10.2 08/22/2017   CAION 1.30 04/15/2010     Examination: BP 134/80   Pulse 65   Ht 5' 2.5" (1.588 m)   Wt 193 lb 9.6 oz (87.8 kg)   SpO2 98%   BMI 34.85 kg/m   Body mass index is 34.85 kg/m.     ASSESSMENT/ PLAN:   Diabetes type 2  See history of present illness for detailed discussion of current diabetes management, blood sugar patterns and problems identified  Her A1c is again higher than expected for her home readings She checks readings after meals and fasting reading is normal in the morning Has had consistent control with metformin only Usually trying to maintain her weight with good diet although limited in her ability to exercise She will continue same regimen  HYPERTENSION: Consistently well controlled   GOITER, large multinodular goiter, chronic Will recheck TSH on next visit  HYPERCALCEMIA: May be related to HCTZ since PTH level was normal and more recently her levels are upper normal consistently   Influenza vaccine  given   [facility] HospitalKUMAR,Janet Williamson 08/27/2017, 11:04 AM   Note: This office note was prepared with Insurance underwriterDragon voice recognition system technology. Any transcriptional errors that result from this process are unintentional.

## 2017-11-05 ENCOUNTER — Other Ambulatory Visit: Payer: Self-pay | Admitting: Endocrinology

## 2017-12-26 ENCOUNTER — Ambulatory Visit (INDEPENDENT_AMBULATORY_CARE_PROVIDER_SITE_OTHER): Payer: Medicare Other | Admitting: Endocrinology

## 2017-12-26 ENCOUNTER — Encounter: Payer: Self-pay | Admitting: Endocrinology

## 2017-12-26 VITALS — BP 140/68 | HR 70 | Wt 197.0 lb

## 2017-12-26 DIAGNOSIS — E119 Type 2 diabetes mellitus without complications: Secondary | ICD-10-CM

## 2017-12-26 DIAGNOSIS — E042 Nontoxic multinodular goiter: Secondary | ICD-10-CM

## 2017-12-26 NOTE — Progress Notes (Signed)
Patient ID: Janet Williamson, female   DOB: 1941-09-11, 77 y.o.   MRN: [id number]   Reason for Appointment: Diabetes follow-up   History of Present Illness   Type 2 DIABETES MELITUS, date of diagnosis 02/2012     She has had mild diabetes which has been well controlled with metformin ER alone. Has had no side effects from metformin  She also has had diabetes education in 2013 . Oral hypoglycemic drugs: Metformin 1.5 g        Side effects from medications: None  Her A1c tends to be higher than expected for her blood sugars and is again 6.8 1/19    Current management, blood sugar patterns and problems identified:  She has  brought her monitor for download   Her blood sugars are being checked mostly in the evening before after dinner and these are fairly close to normal usually with highest reading only 151  Her lab glucose was 112  She still has difficulty losing weight and has limited mobility  Usually trying to watch her diet although not consistently over the last 3 months  Has gained some weight  No side effects with her metformin 1500 mg dosage  Monitors blood glucose:  less than once a day     Glucometer:  Accu-Chek  Blood Glucose readings from DOWNLOAD as follows: AVERAGE 108, range 86-151     Previous visits with dietitian: Several years ago  Dinner At 5-6 pm       Physical activity: exercise: Very little walking recently  Wt Readings from Last 3 Encounters:  12/26/17 197 lb (89.4 kg)  08/27/17 193 lb 9.6 oz (87.8 kg)  08/20/17 193 lb 6.4 oz (87.7 kg)     Lab Results  Component Value Date   HGBA1C 6.8 (H) 08/22/2017   HGBA1C 7.1 (H) 04/23/2017   HGBA1C 6.9 (H) 12/18/2016   Lab Results  Component Value Date   MICROALBUR <0.7 04/23/2017   LDLCALC 66 04/23/2017   CREATININE 0.77 08/22/2017     Other active problems evaluated today: See review of systems   LABS:  No visits with results within 1 Week(s) from this visit.  Latest known  visit with results is:  Lab on 08/22/2017  Component Date Value Ref Range Status  . PTH 08/22/2017 23  15 - 65 pg/mL Final  . Sodium 08/22/2017 137  135 - 145 mEq/L Final  . Potassium 08/22/2017 4.1  3.5 - 5.1 mEq/L Final  . Chloride 08/22/2017 98  96 - 112 mEq/L Final  . CO2 08/22/2017 34* 19 - 32 mEq/L Final  . Glucose, Bld 08/22/2017 108* 70 - 99 mg/dL Final  . BUN 40/98/1191 13  6 - 23 mg/dL Final  . Creatinine, Ser 08/22/2017 0.77  0.40 - 1.20 mg/dL Final  . Calcium 47/82/9562 10.2  8.4 - 10.5 mg/dL Final  . GFR 13/05/6577 93.56  >60.00 mL/min Final  . Hgb A1c MFr Bld 08/22/2017 6.8* 4.6 - 6.5 % Final   Glycemic Control Guidelines for People with Diabetes:Non Diabetic:  <6%Goal of Therapy: <7%Additional Action Suggested:  >8%     Allergies as of 12/26/2017      Reactions   Ace Inhibitors    angioedema   Keflex [cephalexin] Swelling   lips   Ramipril Swelling   Rifadin [rifampin] Swelling   lips      Medication List        Accurate as of 12/26/17  9:37 AM. Always use your most recent med  list.          acetaminophen 500 MG tablet Commonly known as:  TYLENOL Take 500 mg by mouth every 6 (six) hours as needed for moderate pain.   amLODipine 5 MG tablet Commonly known as:  NORVASC Take 5 mg by mouth daily.   carboxymethylcellulose 0.5 % Soln Commonly known as:  REFRESH PLUS Place 1 drop into both eyes 2 (two) times daily as needed (for dry eyes).   cholecalciferol 1000 units tablet Commonly known as:  VITAMIN D Take 1,000 Units by mouth daily.   hydrochlorothiazide 25 MG tablet Commonly known as:  HYDRODIURIL Take 25 mg by mouth daily.   metFORMIN 750 MG 24 hr tablet Commonly known as:  GLUCOPHAGE-XR TAKE 2 TABLETS BY MOUTH ONCE DAILY WITH BREAKFAST   metoprolol succinate 50 MG 24 hr tablet Commonly known as:  TOPROL-XL Take 50 mg by mouth daily. Take with or immediately following a meal.   potassium chloride 10 MEQ tablet Commonly known as:  KLOR-CON  10 Take 1 tablet (10 mEq total) by mouth daily.   rivaroxaban 20 MG Tabs tablet Commonly known as:  XARELTO Take 1 tablet (20 mg total) daily with supper by mouth.   simvastatin 20 MG tablet Commonly known as:  ZOCOR Take 20 mg by mouth every evening.       Allergies:  Allergies  Allergen Reactions  . Ace Inhibitors     angioedema  . Keflex [Cephalexin] Swelling    lips  . Ramipril Swelling  . Rifadin [Rifampin] Swelling    lips    Past Medical History:  Diagnosis Date  . Anemia   . Aortic stenosis    moderate AS 10/2012 echo (Dr. Anne Fu)  . Cardiomyopathy in other disease   . Cataracts, bilateral   . Diabetes mellitus   . Elevated cholesterol   . Generalized osteoarthritis   . GERD (gastroesophageal reflux disease)   . Heart murmur   . Hypertension   . Mitral valve disorder   . Phlebitis   . Stroke (HCC)   . Thrombophlebitis     Past Surgical History:  Procedure Laterality Date  . ABDOMINAL HYSTERECTOMY    . EYE SURGERY     cataract removal  . TOTAL KNEE ARTHROPLASTY Right 04/14/2013   Dr August Saucer  . TOTAL KNEE ARTHROPLASTY Right 04/14/2013   Procedure: TOTAL KNEE ARTHROPLASTY;  Surgeon: Cammy Copa, MD;  Location: [facility] OR;  Service: Orthopedics;  Laterality: Right;    Family History  Problem Relation Age of Onset  . Stroke Mother   . Hypertension Mother   . Hypertension Father   . Heart attack Neg Hx     Social History:  reports that she has never smoked. She has never used smokeless tobacco. She reports that she does not drink alcohol or use drugs.  Review of Systems:  HYPERTENSION:  she has been managed with 5 mg amlodipine, 50 mg metoprolol and hydrochlorothiazide, followed by PCP. Has good control   She had swelling of the lips with ramipril and has not been prescribed an ARB   HYPERLIPIDEMIA: The lipid abnormality consists of elevated LDL treated with simvastatin 20 mg by PCP and LDL is controlled as follows   Lab Results  Component  Value Date   CHOL 121 04/23/2017   HDL 38.10 (L) 04/23/2017   LDLCALC 66 04/23/2017   TRIG 84.0 04/23/2017   CHOLHDL 3 04/23/2017    Multinodular goiter:  She has had a long-standing multinodular goiter, last ultrasound  was done in 05/2012 which showed the largest nodule to be 4.1 cm nodule in the isthmus  She does not complain of any difficulty swallowing, local pressure symptoms  TSH has been normal previously  Lab Results  Component Value Date   TSH 1.61 04/03/2016    No history of numbness or Paresthesia in her feet Foot exam done in 1/19 by her PCP  Last eye exam was in 07/2016  HYPERCALCEMIA: This has been upper normal fairly consistently Recent labs from PCP not available She was told to stop her calcium supplements Still taking HCTZ 25 mg for hypertension  No history of osteoporosis   Lab Results  Component Value Date   CALCIUM 10.2 08/22/2017   CALCIUM 10.1 07/15/2017   CALCIUM 10.0 07/06/2017   CALCIUM 10.3 04/23/2017   CALCIUM 10.7 (H) 12/18/2016    Lab Results  Component Value Date   PTH 23 08/22/2017   CALCIUM 10.2 08/22/2017   CAION 1.30 04/15/2010     Examination: BP 140/68 (BP Location: Left Arm, Patient Position: Sitting, Cuff Size: Large)   Pulse 70   Wt 197 lb (89.4 kg)   SpO2 96%   BMI 35.46 kg/m   Body mass index is 35.46 kg/m.   She has about a 3 cm nodule on the left of the isthmus which is smooth and slightly firm The right side has a nodular enlargement about 2-1/2 times normal and the left side is relatively smaller   ASSESSMENT/ PLAN:   Diabetes type 2 with mild obesity See history of present illness for discussion of current diabetes management, blood sugar patterns and problems identified  Her A1c is again 6.8 and excellent for her age Her home blood sugars are generally fairly good and averaging only 108 She is doing well with metformin only  Although her level of control is unchanged she has gained weight Discussed  that she needs to try to work on cutting back on higher carbohydrate and high fat meals and snacks Also try to be more active with some walking also No change in metformin to be done  HYPERTENSION: Well controlled   GOITER, large multinodular goiter, stable on exam Will recheck TSH on next visit       Reather Littler 12/26/2017, 9:37 AM   Note: This office note was prepared with Dragon voice recognition system technology. Any transcriptional errors that result from this process are unintentional.

## 2018-01-30 ENCOUNTER — Encounter: Payer: Self-pay | Admitting: Nurse Practitioner

## 2018-02-17 ENCOUNTER — Encounter: Payer: Self-pay | Admitting: Nurse Practitioner

## 2018-02-17 ENCOUNTER — Ambulatory Visit (INDEPENDENT_AMBULATORY_CARE_PROVIDER_SITE_OTHER): Payer: Medicare Other | Admitting: Nurse Practitioner

## 2018-02-17 VITALS — BP 160/70 | HR 69 | Wt 193.8 lb

## 2018-02-17 DIAGNOSIS — I350 Nonrheumatic aortic (valve) stenosis: Secondary | ICD-10-CM | POA: Diagnosis not present

## 2018-02-17 DIAGNOSIS — I10 Essential (primary) hypertension: Secondary | ICD-10-CM

## 2018-02-17 DIAGNOSIS — Z8673 Personal history of transient ischemic attack (TIA), and cerebral infarction without residual deficits: Secondary | ICD-10-CM | POA: Diagnosis not present

## 2018-02-17 DIAGNOSIS — Z79899 Other long term (current) drug therapy: Secondary | ICD-10-CM

## 2018-02-17 DIAGNOSIS — I480 Paroxysmal atrial fibrillation: Secondary | ICD-10-CM

## 2018-02-17 LAB — CBC
Hematocrit: 30.1 % — ABNORMAL LOW (ref 34.0–46.6)
Hemoglobin: 9.5 g/dL — ABNORMAL LOW (ref 11.1–15.9)
MCH: 22 pg — ABNORMAL LOW (ref 26.6–33.0)
MCHC: 31.6 g/dL (ref 31.5–35.7)
MCV: 70 fL — ABNORMAL LOW (ref 79–97)
Platelets: 486 10*3/uL — ABNORMAL HIGH (ref 150–379)
RBC: 4.31 x10E6/uL (ref 3.77–5.28)
RDW: 17.8 % — ABNORMAL HIGH (ref 12.3–15.4)
WBC: 9.8 10*3/uL (ref 3.4–10.8)

## 2018-02-17 LAB — BASIC METABOLIC PANEL
BUN/Creatinine Ratio: 17 (ref 12–28)
BUN: 14 mg/dL (ref 8–27)
CO2: 26 mmol/L (ref 20–29)
Calcium: 9.9 mg/dL (ref 8.7–10.3)
Chloride: 99 mmol/L (ref 96–106)
Creatinine, Ser: 0.81 mg/dL (ref 0.57–1.00)
GFR calc Af Amer: 81 mL/min/{1.73_m2} (ref >59)
GFR calc non Af Amer: 70 mL/min/{1.73_m2} (ref >59)
Glucose: 111 mg/dL — ABNORMAL HIGH (ref 65–99)
Potassium: 3.8 mmol/L (ref 3.5–5.2)
Sodium: 140 mmol/L (ref 134–144)

## 2018-02-17 NOTE — Patient Instructions (Addendum)
We will be checking the following labs today - CBC & BMET   Medication Instructions:    Continue with your current medicines.     Testing/Procedures To Be Arranged:  Echocardiogram  Follow-Up:   See Dr. Anne Fu in 3 months    Other Special Instructions:   N/A    If you need a refill on your cardiac medications before your next appointment, please call your pharmacy.   Call the [facility] office at [phone] if you have any questions, problems or concerns.

## 2018-02-17 NOTE — Progress Notes (Addendum)
CARDIOLOGY OFFICE NOTE  Date:  02/17/2018    [name] Date of Birth: Feb 09, 1941 Medical Record #[id number]  PCP:  Kelton Pillar, MD  Cardiologist:  [facility]  Chief Complaint  Patient presents with  . Aortic Stenosis  . Cardiac Valve Problem    Follow up visit - seen for Dr. Marlou Porch    History of Present Illness: Janet Williamson is a 77 y.o. female who presents today for a 6 month check. Seen for Dr. Marlou Porch.   She has a history of moderate AS, mitral annular calcification, prior "mass "on mitral valve of 1.3 cm with previous embolic stroke in 2423 currently on Plavix, new onset of AF in 06/2017 treated with cardioversion in the ER and placed on Xarelto.   Cardiac catheterization in January 2011 showed no flow-limiting coronary artery disease. She has had continued monitoring of the mass on her mitral valve/annulus. This has been noted to be indicative of mitral annular calcification.  Last seen in November following her cardioversion - she was doing well.   Comes in today. Here alone. She notes more shortness of breath with less exertion - she notes this with changing her bed linens, with light housekeeping and with walking very short distances. She feels this has gotten worse since last seen here. No chest pain/pressure, not dizzy or lightheaded. No syncope. She does not feel like she has had any AF. She remains on Xarelto - no problems noted - no recent CBC. Some mild anemia noted on past lab.   Past Medical History:  Diagnosis Date  . Anemia   . Aortic stenosis    moderate AS 10/2012 echo (Dr. Marlou Porch)  . Cardiomyopathy in other disease   . Cataracts, bilateral   . Diabetes mellitus   . Elevated cholesterol   . Generalized osteoarthritis   . GERD (gastroesophageal reflux disease)   . Heart murmur   . Hypertension   . Mitral valve disorder   . Phlebitis   . Stroke ([place])   . Thrombophlebitis     Past Surgical History:  Procedure Laterality Date  .  ABDOMINAL HYSTERECTOMY    . EYE SURGERY     cataract removal  . TOTAL KNEE ARTHROPLASTY Right 04/14/2013   Dr Marlou Sa  . TOTAL KNEE ARTHROPLASTY Right 04/14/2013   Procedure: TOTAL KNEE ARTHROPLASTY;  Surgeon: Meredith Pel, MD;  Location: [facility];  Service: Orthopedics;  Laterality: Right;     Medications: Current Meds  Medication Sig  . acetaminophen (TYLENOL) 500 MG tablet Take 500 mg by mouth every 6 (six) hours as needed for moderate pain.  Marland Kitchen amLODipine (NORVASC) 5 MG tablet Take 5 mg by mouth daily.  . carboxymethylcellulose (REFRESH PLUS) 0.5 % SOLN Place 1 drop into both eyes 2 (two) times daily as needed (for dry eyes).   . cholecalciferol (VITAMIN D) 1000 UNITS tablet Take 1,000 Units by mouth daily.  . hydrochlorothiazide (HYDRODIURIL) 25 MG tablet Take 25 mg by mouth daily.  . metFORMIN (GLUCOPHAGE-XR) 750 MG 24 hr tablet TAKE 2 TABLETS BY MOUTH ONCE DAILY WITH BREAKFAST  . metoprolol succinate (TOPROL-XL) 50 MG 24 hr tablet Take 50 mg by mouth daily. Take with or immediately following a meal.  . potassium chloride (KLOR-CON 10) 10 MEQ tablet Take 1 tablet (10 mEq total) by mouth daily.  . rivaroxaban (XARELTO) 20 MG TABS tablet Take 1 tablet (20 mg total) daily with supper by mouth.  . simvastatin (ZOCOR) 20 MG tablet Take 20 mg  by mouth every evening.     Allergies: Allergies  Allergen Reactions  . Ace Inhibitors     angioedema  . Keflex [Cephalexin] Swelling    lips  . Ramipril Swelling  . Rifadin [Rifampin] Swelling    lips    Social History: The patient  reports that she has never smoked. She has never used smokeless tobacco. She reports that she does not drink alcohol or use drugs.   Family History: The patient's family history includes Hypertension in her father and mother; Stroke in her mother.   Review of Systems: Please see the history of present illness.   Otherwise, the review of systems is positive for none.   All other systems are reviewed and  negative.   Physical Exam: VS:  BP (!) 160/70 (BP Location: Left Arm, Patient Position: Sitting, Cuff Size: Large)   Pulse 69   Wt 193 lb 12.8 oz (87.9 kg)   SpO2 96% Comment: at rest  BMI 34.88 kg/m  .  BMI Body mass index is 34.88 kg/m.  Wt Readings from Last 3 Encounters:  02/17/18 193 lb 12.8 oz (87.9 kg)  12/26/17 197 lb (89.4 kg)  08/27/17 193 lb 9.6 oz (87.8 kg)   BP recheck by me is 130/70  General: Pleasant. Obese. Alert and in no acute distress.   HEENT: Normal.  Neck: Supple, no JVD, carotid bruits, or masses noted.  Cardiac: Regular rate and rhythm. Harsh outflow murmur.  No edema.  Respiratory:  Lungs are clear to auscultation bilaterally with normal work of breathing.  GI: Soft and nontender.  MS: No deformity or atrophy. Gait and ROM intact.  Skin: Warm and dry. Color is normal.  Neuro:  Strength and sensation are intact and no gross focal deficits noted.  Psych: Alert, appropriate and with normal affect.   LABORATORY DATA:  EKG:  EKG is not ordered today.   Lab Results  Component Value Date   WBC 10.7 (H) 07/06/2017   HGB 11.2 (L) 07/06/2017   HCT 35.7 (L) 07/06/2017   PLT 444 (H) 07/06/2017   GLUCOSE 108 (H) 08/22/2017   CHOL 121 04/23/2017   TRIG 84.0 04/23/2017   HDL 38.10 (L) 04/23/2017   LDLCALC 66 04/23/2017   ALT 9 08/01/2016   AST 15 08/01/2016   NA 137 08/22/2017   K 4.1 08/22/2017   CL 98 08/22/2017   CREATININE 0.77 08/22/2017   BUN 13 08/22/2017   CO2 34 (H) 08/22/2017   TSH 1.61 04/03/2016   INR 1.10 07/14/2013   HGBA1C 6.8 (H) 08/22/2017   MICROALBUR <0.7 04/23/2017       BNP (last 3 results) No results for input(s): BNP in the last 8760 hours.  ProBNP (last 3 results) No results for input(s): PROBNP in the last 8760 hours.   Other Studies Reviewed Today:  ECHO 01/22/17  - Left ventricle: The cavity size was normal. There was mild concentric hypertrophy. Systolic function was normal. The estimated ejection  fraction was in the range of 55% to 60%. Wall motion was normal; there were no regional wall motion abnormalities. Doppler parameters are consistent with abnormal left ventricular relaxation (grade 1 diastolic dysfunction). Doppler parameters are consistent with high ventricular filling pressure. - Aortic valve: Valve mobility was restricted. There was moderate stenosis. There was moderate regurgitation. Peak velocity (S): 347 cm/s. Mean gradient (S): 30 mm Hg. Valve area (VTI): 0.91 cm^2. Valve area (Vmax): 0.9 cm^2. Valve area (Vmean): 0.89 cm^2. Regurgitation pressure half-time: 217 ms. -  Mitral valve: Calcified annulus. The findings are consistent with mild stenosis. There was trivial regurgitation. Valve area by continuity equation (using LVOT flow): 1.92 cm^2. - Left atrium: The atrium was moderately dilated. - Right ventricle: The cavity size was normal. Wall thickness was normal. Systolic function was normal. - Atrial septum: No defect or patent foramen ovale was identified by color flow Doppler. - Tricuspid valve: There was mild regurgitation. - Pulmonary arteries: PA peak pressure: 36 mm Hg (S).   Assessment/Plan:  1. PAF - cardioverted in 06/2017 - now on Xarelto - remains in NSR by exam today.   2. Nonrheumatic AS - needs echo updated. She endorses more DOE.   3. Mitral valve mass/MAC - getting echo updated.   4. Prior stroke - she is on Xarelto - needs surveillance lab today  5. HTN - recheck of her BP by me is ok - no changes made today.   6. Chronic LBBB  7. HLD - on statin - labs from her PCP from January noted.   Current medicines are reviewed with the patient today.  The patient does not have concerns regarding medicines other than what has been noted above.  The following changes have been made:  See above.  Labs/ tests ordered today include:    Orders Placed This Encounter  Procedures  . Basic metabolic panel  . CBC  .  ECHOCARDIOGRAM COMPLETE     Disposition:   FU with Dr. Marlou Porch in 3 months.   Patient is agreeable to this plan and will call if any problems develop in the interim.   SignedTruitt Merle, NP  02/17/2018 10:04 AM  [facility] [address] [place], [place]  [address] Phone: [phone] Fax: [phone]      Addendum: 02/27/18  Discussed echo results with Dr. Marlou Porch = will arrange for precath visit = will need L/R heart cath.   Burtis Junes, RN, [place] [address] [place], [place]  [address] [phone]

## 2018-02-26 ENCOUNTER — Ambulatory Visit (HOSPITAL_COMMUNITY): Payer: Medicare Other | Attending: Cardiology

## 2018-02-26 ENCOUNTER — Other Ambulatory Visit: Payer: Self-pay

## 2018-02-26 DIAGNOSIS — I083 Combined rheumatic disorders of mitral, aortic and tricuspid valves: Secondary | ICD-10-CM | POA: Insufficient documentation

## 2018-02-26 DIAGNOSIS — I429 Cardiomyopathy, unspecified: Secondary | ICD-10-CM | POA: Diagnosis not present

## 2018-02-26 DIAGNOSIS — I350 Nonrheumatic aortic (valve) stenosis: Secondary | ICD-10-CM | POA: Diagnosis present

## 2018-02-26 DIAGNOSIS — Z8673 Personal history of transient ischemic attack (TIA), and cerebral infarction without residual deficits: Secondary | ICD-10-CM | POA: Diagnosis not present

## 2018-02-26 DIAGNOSIS — I480 Paroxysmal atrial fibrillation: Secondary | ICD-10-CM | POA: Diagnosis not present

## 2018-02-26 DIAGNOSIS — I10 Essential (primary) hypertension: Secondary | ICD-10-CM | POA: Diagnosis not present

## 2018-02-26 DIAGNOSIS — E119 Type 2 diabetes mellitus without complications: Secondary | ICD-10-CM | POA: Insufficient documentation

## 2018-02-26 DIAGNOSIS — D649 Anemia, unspecified: Secondary | ICD-10-CM | POA: Insufficient documentation

## 2018-02-26 DIAGNOSIS — R011 Cardiac murmur, unspecified: Secondary | ICD-10-CM | POA: Diagnosis not present

## 2018-02-26 DIAGNOSIS — I272 Pulmonary hypertension, unspecified: Secondary | ICD-10-CM | POA: Diagnosis not present

## 2018-03-04 ENCOUNTER — Encounter: Payer: Self-pay | Admitting: Physician Assistant

## 2018-03-04 ENCOUNTER — Ambulatory Visit (INDEPENDENT_AMBULATORY_CARE_PROVIDER_SITE_OTHER): Payer: Medicare Other | Admitting: Physician Assistant

## 2018-03-04 VITALS — BP 124/64 | HR 73 | Ht 62.5 in | Wt 194.0 lb

## 2018-03-04 DIAGNOSIS — Z8673 Personal history of transient ischemic attack (TIA), and cerebral infarction without residual deficits: Secondary | ICD-10-CM | POA: Diagnosis not present

## 2018-03-04 DIAGNOSIS — I480 Paroxysmal atrial fibrillation: Secondary | ICD-10-CM

## 2018-03-04 DIAGNOSIS — D649 Anemia, unspecified: Secondary | ICD-10-CM

## 2018-03-04 DIAGNOSIS — M7989 Other specified soft tissue disorders: Secondary | ICD-10-CM | POA: Diagnosis not present

## 2018-03-04 DIAGNOSIS — I350 Nonrheumatic aortic (valve) stenosis: Secondary | ICD-10-CM

## 2018-03-04 DIAGNOSIS — I10 Essential (primary) hypertension: Secondary | ICD-10-CM

## 2018-03-04 DIAGNOSIS — I059 Rheumatic mitral valve disease, unspecified: Secondary | ICD-10-CM

## 2018-03-04 DIAGNOSIS — I058 Other rheumatic mitral valve diseases: Secondary | ICD-10-CM

## 2018-03-04 LAB — CBC WITH DIFFERENTIAL/PLATELET
BASOS ABS: 0 10*3/uL (ref 0.0–0.2)
Basos: 1 %
EOS (ABSOLUTE): 0.1 10*3/uL (ref 0.0–0.4)
EOS: 1 %
HEMATOCRIT: 29.7 % — AB (ref 34.0–46.6)
Hemoglobin: 9 g/dL — ABNORMAL LOW (ref 11.1–15.9)
Immature Grans (Abs): 0 10*3/uL (ref 0.0–0.1)
Immature Granulocytes: 0 %
LYMPHS ABS: 2.4 10*3/uL (ref 0.7–3.1)
Lymphs: 29 %
MCH: 21.4 pg — ABNORMAL LOW (ref 26.6–33.0)
MCHC: 30.3 g/dL — AB (ref 31.5–35.7)
MCV: 71 fL — ABNORMAL LOW (ref 79–97)
MONOS ABS: 0.5 10*3/uL (ref 0.1–0.9)
Monocytes: 6 %
Neutrophils Absolute: 5.2 10*3/uL (ref 1.4–7.0)
Neutrophils: 63 %
PLATELETS: 502 10*3/uL — AB (ref 150–450)
RBC: 4.21 x10E6/uL (ref 3.77–5.28)
RDW: 18.1 % — AB (ref 12.3–15.4)
WBC: 8.3 10*3/uL (ref 3.4–10.8)

## 2018-03-04 LAB — BASIC METABOLIC PANEL
BUN / CREAT RATIO: 10 — AB (ref 12–28)
BUN: 8 mg/dL (ref 8–27)
CHLORIDE: 99 mmol/L (ref 96–106)
CO2: 26 mmol/L (ref 20–29)
Calcium: 9.7 mg/dL (ref 8.7–10.3)
Creatinine, Ser: 0.77 mg/dL (ref 0.57–1.00)
GFR calc Af Amer: 86 mL/min/{1.73_m2} (ref >59)
GFR calc non Af Amer: 75 mL/min/{1.73_m2} (ref >59)
Glucose: 116 mg/dL — ABNORMAL HIGH (ref 65–99)
POTASSIUM: 3.9 mmol/L (ref 3.5–5.2)
SODIUM: 141 mmol/L (ref 134–144)

## 2018-03-04 LAB — PROTIME-INR
INR: 1.2 (ref 0.8–1.2)
PROTHROMBIN TIME: 12.3 s — AB (ref 9.1–12.0)

## 2018-03-04 NOTE — H&P (View-Only) (Signed)
Cardiology Office Note    Date:  03/04/2018   ID:  Janet Williamson, Janet Williamson 04-21-1941, MRN [id number]  PCP:  Maurice Small, MD  Cardiologist: Donato Schultz, MD  Chief Complaint  Patient presents with  . Follow-up    History of Present Illness:  Janet Williamson is a 77 y.o. female with history of moderate AS, prior mass on mitral valve 1.3 cm with previous embolic stroke in 2008 treated with Plavix, new onset atrial fibrillation 06/2017 treated with cardioversion in the ER and placed on Xarelto.  Cardiac cath in 2011 non-flow-limiting CAD.  Patient last saw Norma Fredrickson, NP 02/17/2018 at which time she was complaining of worsening shortness of breath with less exertion.  It occurred with changing bed linens and light housekeeping.  No chest pain or syncope.  2D echo 02/26/2018 showed normal LV size with moderate LVH EF 55 to 60% with severe aortic stenosis and mild aortic insufficiency, normal RV size and systolic function but moderate pulmonary hypertension.  Mean gradient 47 mmHg peak gradient 71 mmHg.  Dr. Anne Fu reviewed and recommend admission for right and left heart catheterization.  Patient comes in to be set up for cardiac catheterization.  She has a little bit more swelling today.  She said her  [place] they had a lot of outside food brought in for the family.  She did get extra salt.  She denies any increase in shortness of breath, chest pain or presyncope.  Past Medical History:  Diagnosis Date  . Anemia   . Aortic stenosis    moderate AS 10/2012 echo (Dr. Anne Fu)  . Cardiomyopathy in other disease   . Cataracts, bilateral   . Diabetes mellitus   . Elevated cholesterol   . Generalized osteoarthritis   . GERD (gastroesophageal reflux disease)   . Heart murmur   . Hypertension   . Mitral valve disorder   . Phlebitis   . Stroke (HCC)   . Thrombophlebitis     Past Surgical History:  Procedure Laterality Date  .  ABDOMINAL HYSTERECTOMY    . EYE SURGERY     cataract removal  . TOTAL KNEE ARTHROPLASTY Right 04/14/2013   Dr August Saucer  . TOTAL KNEE ARTHROPLASTY Right 04/14/2013   Procedure: TOTAL KNEE ARTHROPLASTY;  Surgeon: Cammy Copa, MD;  Location: [facility] OR;  Service: Orthopedics;  Laterality: Right;    Current Medications: Current Meds  Medication Sig  . acetaminophen (TYLENOL) 500 MG tablet Take 500 mg by mouth every 6 (six) hours as needed for moderate pain.  Marland Kitchen amLODipine (NORVASC) 5 MG tablet Take 5 mg by mouth daily.  . carboxymethylcellulose (REFRESH PLUS) 0.5 % SOLN Place 1 drop into both eyes 2 (two) times daily as needed (for dry eyes).   . cholecalciferol (VITAMIN D) 1000 UNITS tablet Take 1,000 Units by mouth daily.  . hydrochlorothiazide (HYDRODIURIL) 25 MG tablet Take 25 mg by mouth daily.  . metFORMIN (GLUCOPHAGE-XR) 750 MG 24 hr tablet TAKE 2 TABLETS BY MOUTH ONCE DAILY WITH BREAKFAST  . metoprolol succinate (TOPROL-XL) 50 MG 24 hr tablet Take 50 mg by mouth daily. Take with or immediately following a meal.  . potassium chloride (KLOR-CON 10) 10 MEQ tablet Take 1 tablet (10 mEq total) by mouth daily.  . rivaroxaban (XARELTO) 20 MG TABS tablet Take 1 tablet (20 mg total) daily with supper by mouth.  . simvastatin (ZOCOR) 20 MG tablet Take 20 mg by  mouth every evening.     Allergies:   Ace inhibitors; Keflex [cephalexin]; Ramipril; and Rifadin [rifampin]   Social History   Socioeconomic History  . Marital status: Widowed    Spouse name: Not on file  . Number of children: Not on file  . Years of education: Not on file  . Highest education level: Not on file  Occupational History  . Not on file  Social Needs  . Financial resource strain: Not on file  . Food insecurity:    Worry: Not on file    Inability: Not on file  . Transportation needs:    Medical: Not on file    Non-medical: Not on file  Tobacco Use  . Smoking status: Never Smoker  . Smokeless tobacco: Never Used    Substance and Sexual Activity  . Alcohol use: No  . Drug use: No  . Sexual activity: Not Currently  Lifestyle  . Physical activity:    Days per week: Not on file    Minutes per session: Not on file  . Stress: Not on file  Relationships  . Social connections:    Talks on phone: Not on file    Gets together: Not on file    Attends religious service: Not on file    Active member of club or organization: Not on file    Attends meetings of clubs or organizations: Not on file    Relationship status: Not on file  Other Topics Concern  . Not on file  Social History Narrative  . Not on file     Family History:  The patient's family history includes Hypertension in her father and mother; Stroke in her mother.   ROS:   Please see the history of present illness.    Review of Systems  Constitution: Negative.  HENT: Negative.   Eyes: Negative.   Cardiovascular: Positive for dyspnea on exertion and leg swelling.  Respiratory: Negative.   Hematologic/Lymphatic: Negative.   Musculoskeletal: Negative.  Negative for joint pain.  Gastrointestinal: Negative.   Genitourinary: Negative.   Neurological: Negative.    All other systems reviewed and are negative.   PHYSICAL EXAM:   VS:  BP 124/64   Pulse 73   Ht 5' 2.5" (1.588 m)   Wt 194 lb (88 kg)   BMI 34.92 kg/m   Physical Exam  GEN: Well nourished, well developed, in no acute distress  Neck: no JVD, carotid bruits, or masses Cardiac:RRR; loud 4/6 harsh systolic murmur at the right sternal border, decrease S2 Respiratory:  clear to auscultation bilaterally, normal work of breathing GI: soft, nontender, nondistended, + BS Ext: +1 edema bilaterally without cyanosis, clubbing, or Good distal pulses bilaterally Neuro:  Alert and Oriented x 3 Psych: euthymic mood, full affect  Wt Readings from Last 3 Encounters:  03/04/18 194 lb (88 kg)  02/17/18 193 lb 12.8 oz (87.9 kg)  12/26/17 197 lb (89.4 kg)      Studies/Labs Reviewed:    EKG:  EKG is ordered today.  The ekg ordered today demonstrates normal sinus rhythm with left bundle branch block, unchanged from prior EKG  Recent Labs: 02/17/2018: BUN 14; Creatinine, Ser 0.81; Hemoglobin 9.5; Platelets 486; Potassium 3.8; Sodium 140   Lipid Panel    Component Value Date/Time   CHOL 121 04/23/2017 1022   TRIG 84.0 04/23/2017 1022   HDL 38.10 (L) 04/23/2017 1022   CHOLHDL 3 04/23/2017 1022   VLDL 16.8 04/23/2017 1022   LDLCALC 66 04/23/2017 1022  Additional studies/ records that were reviewed today include:   2D echo 5/22/2019Study Conclusions   - Left ventricle: The cavity size was normal. Wall thickness was   increased in a pattern of moderate LVH. Systolic function was   normal. The estimated ejection fraction was in the range of 55%   to 60%. Wall motion was normal; there were no regional wall   motion abnormalities. Doppler parameters are consistent with   abnormal left ventricular relaxation (grade 1 diastolic   dysfunction). - Aortic valve: Trileaflet; severely calcified leaflets. There was   severe stenosis. There was mild regurgitation. Mean gradient (S):   47 mm Hg. Peak gradient (S): 71 mm Hg. - Mitral valve: There was mild regurgitation. - Left atrium: The atrium was mildly dilated. - Right ventricle: The cavity size was normal. Systolic function   was normal. - Tricuspid valve: Peak RV-RA gradient (S): 45 mm Hg. - Pulmonary arteries: PA peak pressure: 53 mm Hg (S). - Systemic veins: IVC measured 2.2 cm with > 50% respirophasic   variation, suggesting RA pressure 8 mmHg.   Impressions:   - Normal LV size with moderate LV hypertrophy. EF 55-60%. Severe   aortic stenosis with mild aortic insufficiency. Normal RV size   and systolic function. Moderate pulmonary hypertension.   Aortic valve:   Trileaflet; severely calcified leaflets.  Doppler:  There was severe stenosis.   There was mild regurgitation.    VTI ratio of LVOT to aortic valve:  0.36. Valve area (VTI): 1.09 cm^2. Indexed valve area (VTI): 0.54 cm^2/m^2. Peak velocity ratio of LVOT to aortic valve: 0.34. Valve area (Vmax): 1.03 cm^2. Indexed valve area (Vmax): 0.51 cm^2/m^2. Mean velocity ratio of LVOT to aortic valve: 0.36. Valve area (Vmean): 1.08 cm^2. Indexed valve area (Vmean): 0.54 cm^2/m^2.    Mean gradient (S): 47 mm Hg. Peak gradient (S): 71 mm Hg.    ASSESSMENT:    1. Nonrheumatic aortic valve stenosis   2. Essential hypertension   3. Mitral valve disorder   4. Mitral valve mass   5. History of stroke   6. PAF (paroxysmal atrial fibrillation) (HCC)   7. Leg swelling   8. Anemia, unspecified type      PLAN:  In order of problems listed above:  Nonrheumatic aortic stenosis now severe on 2D echo with worsening dyspnea on exertion with little activity.  Dr. Anne Fu reviewed and recommends right and left heart catheterization this Thursday with Dr. Katrinka Blazing.  Check labs today. I have reviewed the risks, indications, and alternatives to angioplasty and stenting with the patient. Risks include but are not limited to bleeding, infection, vascular injury, stroke, myocardial infection, arrhythmia, kidney injury, radiation-related injury in the case of prolonged fluoroscopy use, emergency cardiac surgery, and death. The patient understands the risks of serious complication is low (<1%) and patient agrees to proceed.    Essential hypertension blood pressure controlled  Mitral valve disorder with previous mass leading to stroke on Plavix-continue  PAF status post cardioversion 06/2017 on Xarelto and normal sinus rhythm today.  Will hold Xarelto 48 hours prior to cath  Leg swelling probably from excessive sodium in the past week.  I told her to take an extra hydrochlorothiazide today when she gets home.  Lungs are clear.  No JVD.  Checking labs today for precath.  Anemia with hemoglobin of 9.5.  Denies any bleeding black stools blood in the stool.  Told her to  call PCP today for work-up.   Medication Adjustments/Labs and Tests Ordered:  Current medicines are reviewed at length with the patient today.  Concerns regarding medicines are outlined above.  Medication changes, Labs and Tests ordered today are listed in the Patient Instructions below. Patient Instructions  Medication Instructions:  Your physician recommends that you continue on your current medications as directed. Please refer to the Current Medication list given to you today. 1. Take extra HCTZ today only.  Labwork: Your physician recommends that you have lab work today: bmet/cbc/pt/inr   Testing/Procedures: -None  Follow-Up: Your physician recommends that you keep your scheduled  follow-up appointment with Dr. Anne Fu.   Any Other Special Instructions Will Be Listed Below (If Applicable).   Your provider has recommended a cardiac catherization  You are scheduled for a cardiac catheterization on Thursday, May 30 @ 12:00 pm   with Dr. Katrinka Blazing.  or associate.  Please arrive at the [facility] (Main Entrance) at [facility] at [address] [place] -  2nd Floor Short Stay on Thursday, May 30 @ 10:000 am.    Special note: Every effort is made to have your procedure done on time.   Please understand that emergencies sometimes delay a scheduled   procedure.  No solid feeds after midnight on Wednesday, May 29.  You may have clear liquids until 5 am on the day of your procedure.  On the morning of your procedure, take your medications except what is listed below.  You may take your morning medications with a sip of water on the day of your procedure.  Please take a baby aspirin (81 mg) on the morning of your procedure.   Medications to HOLD - Xarelto two days prior to procedure.   HOLD - Metformin the day of and 48 hours after procedure.   HOLD HCTZ the day of.  Plan for a one night stay -- bring personal belongings.  Bring a current list of your medications and  current insurance cards.  You MUST have a responsible person to drive you home. Someone MUST be with you the first 24 hours after you arrive home or your discharge will be delayed. Wear clothes that are easy to get on and off and wear slip on shoes.    Coronary Angiogram A coronary angiogram, also called coronary angiography, is an X-ray procedure used to look at the arteries in the heart. In this procedure, a dye (contrast dye) is injected through a long, hollow tube (catheter). The catheter is about the size of a piece of cooked spaghetti and is inserted through your groin, wrist, or arm. The dye is injected into each artery, and X-rays are then taken to show if there is a blockage in the arteries of your heart.  LET [facility] CARE PROVIDER KNOW ABOUT:  Any allergies you have, including allergies to shellfish or contrast dye.    All medicines you are taking, including vitamins, herbs, eye drops, creams, and over-the-counter medicines.    Previous problems you or members of your family have had with the use of anesthetics.    Any blood disorders you have.    Previous surgeries you have had.  History of kidney problems or failure.    Other medical conditions you have.  RISKS AND COMPLICATIONS  Generally, a coronary angiogram is a safe procedure. However, about 1 person out of 1000 can have problems that may include:  Allergic reaction to the dye.  Bleeding/bruising from the access site or other locations.  Kidney injury, especially in people with impaired kidney function.  Stroke (rare).  Heart attack (rare).  Irregular rhythms (rare)  Death (rare)  BEFORE THE PROCEDURE   Do not eat or drink anything after midnight the night before the procedure or as directed by your health care provider.    Ask your health care provider about changing or stopping your regular medicines. This is especially important if you are taking diabetes medicines or blood  thinners.  PROCEDURE  You may be given a medicine to help you relax (sedative) before the procedure. This medicine is given through an intravenous (IV) access tube that is inserted into one of your veins.    The area where the catheter will be inserted will be washed and shaved. This is usually done in the groin but may be done in the fold of your arm (near your elbow) or in the wrist.     A medicine will be given to numb the area where the catheter will be inserted (local anesthetic).    The health care provider will insert the catheter into an artery. The catheter will be guided by using a special type of X-ray (fluoroscopy) of the blood vessel being examined.    A special dye will then be injected into the catheter, and X-rays will be taken. The dye will help to show where any narrowing or blockages are located in the heart arteries.     AFTER THE PROCEDURE   If the procedure is done through the leg, you will be kept in bed lying flat for several hours. You will be instructed to not bend or cross your legs.  The insertion site will be checked frequently.    The pulse in your feet or wrist will be checked frequently.    Additional blood tests, X-rays, and an electrocardiogram may be done.      If you need a refill on your cardiac medications before your next appointment, please call your pharmacy.      Elson Clan, PA-C  03/04/2018 12:13 PM    [facility] [address] [place], [place], [place]  [address] Phone: [phone]; Fax: [phone]

## 2018-03-04 NOTE — Progress Notes (Signed)
Cardiology Office Note    Date:  03/04/2018   ID:  Janet Williamson, Janet Williamson 04-21-1941, MRN [id number]  PCP:  Maurice Small, MD  Cardiologist: Donato Schultz, MD  Chief Complaint  Patient presents with  . Follow-up    History of Present Illness:  Janet Williamson is a 77 y.o. female with history of moderate AS, prior mass on mitral valve 1.3 cm with previous embolic stroke in 2008 treated with Plavix, new onset atrial fibrillation 06/2017 treated with cardioversion in the ER and placed on Xarelto.  Cardiac cath in 2011 non-flow-limiting CAD.  Patient last saw Norma Fredrickson, NP 02/17/2018 at which time she was complaining of worsening shortness of breath with less exertion.  It occurred with changing bed linens and light housekeeping.  No chest pain or syncope.  2D echo 02/26/2018 showed normal LV size with moderate LVH EF 55 to 60% with severe aortic stenosis and mild aortic insufficiency, normal RV size and systolic function but moderate pulmonary hypertension.  Mean gradient 47 mmHg peak gradient 71 mmHg.  Dr. Anne Fu reviewed and recommend admission for right and left heart catheterization.  Patient comes in to be set up for cardiac catheterization.  She has a little bit more swelling today.  She said her  [place] they had a lot of outside food brought in for the family.  She did get extra salt.  She denies any increase in shortness of breath, chest pain or presyncope.  Past Medical History:  Diagnosis Date  . Anemia   . Aortic stenosis    moderate AS 10/2012 echo (Dr. Anne Fu)  . Cardiomyopathy in other disease   . Cataracts, bilateral   . Diabetes mellitus   . Elevated cholesterol   . Generalized osteoarthritis   . GERD (gastroesophageal reflux disease)   . Heart murmur   . Hypertension   . Mitral valve disorder   . Phlebitis   . Stroke (HCC)   . Thrombophlebitis     Past Surgical History:  Procedure Laterality Date  .  ABDOMINAL HYSTERECTOMY    . EYE SURGERY     cataract removal  . TOTAL KNEE ARTHROPLASTY Right 04/14/2013   Dr August Saucer  . TOTAL KNEE ARTHROPLASTY Right 04/14/2013   Procedure: TOTAL KNEE ARTHROPLASTY;  Surgeon: Cammy Copa, MD;  Location: [facility] OR;  Service: Orthopedics;  Laterality: Right;    Current Medications: Current Meds  Medication Sig  . acetaminophen (TYLENOL) 500 MG tablet Take 500 mg by mouth every 6 (six) hours as needed for moderate pain.  Marland Kitchen amLODipine (NORVASC) 5 MG tablet Take 5 mg by mouth daily.  . carboxymethylcellulose (REFRESH PLUS) 0.5 % SOLN Place 1 drop into both eyes 2 (two) times daily as needed (for dry eyes).   . cholecalciferol (VITAMIN D) 1000 UNITS tablet Take 1,000 Units by mouth daily.  . hydrochlorothiazide (HYDRODIURIL) 25 MG tablet Take 25 mg by mouth daily.  . metFORMIN (GLUCOPHAGE-XR) 750 MG 24 hr tablet TAKE 2 TABLETS BY MOUTH ONCE DAILY WITH BREAKFAST  . metoprolol succinate (TOPROL-XL) 50 MG 24 hr tablet Take 50 mg by mouth daily. Take with or immediately following a meal.  . potassium chloride (KLOR-CON 10) 10 MEQ tablet Take 1 tablet (10 mEq total) by mouth daily.  . rivaroxaban (XARELTO) 20 MG TABS tablet Take 1 tablet (20 mg total) daily with supper by mouth.  . simvastatin (ZOCOR) 20 MG tablet Take 20 mg by  mouth every evening.     Allergies:   Ace inhibitors; Keflex [cephalexin]; Ramipril; and Rifadin [rifampin]   Social History   Socioeconomic History  . Marital status: Widowed    Spouse name: Not on file  . Number of children: Not on file  . Years of education: Not on file  . Highest education level: Not on file  Occupational History  . Not on file  Social Needs  . Financial resource strain: Not on file  . Food insecurity:    Worry: Not on file    Inability: Not on file  . Transportation needs:    Medical: Not on file    Non-medical: Not on file  Tobacco Use  . Smoking status: Never Smoker  . Smokeless tobacco: Never Used    Substance and Sexual Activity  . Alcohol use: No  . Drug use: No  . Sexual activity: Not Currently  Lifestyle  . Physical activity:    Days per week: Not on file    Minutes per session: Not on file  . Stress: Not on file  Relationships  . Social connections:    Talks on phone: Not on file    Gets together: Not on file    Attends religious service: Not on file    Active member of club or organization: Not on file    Attends meetings of clubs or organizations: Not on file    Relationship status: Not on file  Other Topics Concern  . Not on file  Social History Narrative  . Not on file     Family History:  The patient's family history includes Hypertension in her father and mother; Stroke in her mother.   ROS:   Please see the history of present illness.    Review of Systems  Constitution: Negative.  HENT: Negative.   Eyes: Negative.   Cardiovascular: Positive for dyspnea on exertion and leg swelling.  Respiratory: Negative.   Hematologic/Lymphatic: Negative.   Musculoskeletal: Negative.  Negative for joint pain.  Gastrointestinal: Negative.   Genitourinary: Negative.   Neurological: Negative.    All other systems reviewed and are negative.   PHYSICAL EXAM:   VS:  BP 124/64   Pulse 73   Ht 5' 2.5" (1.588 m)   Wt 194 lb (88 kg)   BMI 34.92 kg/m   Physical Exam  GEN: Well nourished, well developed, in no acute distress  Neck: no JVD, carotid bruits, or masses Cardiac:RRR; loud 4/6 harsh systolic murmur at the right sternal border, decrease S2 Respiratory:  clear to auscultation bilaterally, normal work of breathing GI: soft, nontender, nondistended, + BS Ext: +1 edema bilaterally without cyanosis, clubbing, or Good distal pulses bilaterally Neuro:  Alert and Oriented x 3 Psych: euthymic mood, full affect  Wt Readings from Last 3 Encounters:  03/04/18 194 lb (88 kg)  02/17/18 193 lb 12.8 oz (87.9 kg)  12/26/17 197 lb (89.4 kg)      Studies/Labs Reviewed:    EKG:  EKG is ordered today.  The ekg ordered today demonstrates normal sinus rhythm with left bundle branch block, unchanged from prior EKG  Recent Labs: 02/17/2018: BUN 14; Creatinine, Ser 0.81; Hemoglobin 9.5; Platelets 486; Potassium 3.8; Sodium 140   Lipid Panel    Component Value Date/Time   CHOL 121 04/23/2017 1022   TRIG 84.0 04/23/2017 1022   HDL 38.10 (L) 04/23/2017 1022   CHOLHDL 3 04/23/2017 1022   VLDL 16.8 04/23/2017 1022   LDLCALC 66 04/23/2017 1022  Additional studies/ records that were reviewed today include:   2D echo 5/22/2019Study Conclusions   - Left ventricle: The cavity size was normal. Wall thickness was   increased in a pattern of moderate LVH. Systolic function was   normal. The estimated ejection fraction was in the range of 55%   to 60%. Wall motion was normal; there were no regional wall   motion abnormalities. Doppler parameters are consistent with   abnormal left ventricular relaxation (grade 1 diastolic   dysfunction). - Aortic valve: Trileaflet; severely calcified leaflets. There was   severe stenosis. There was mild regurgitation. Mean gradient (S):   47 mm Hg. Peak gradient (S): 71 mm Hg. - Mitral valve: There was mild regurgitation. - Left atrium: The atrium was mildly dilated. - Right ventricle: The cavity size was normal. Systolic function   was normal. - Tricuspid valve: Peak RV-RA gradient (S): 45 mm Hg. - Pulmonary arteries: PA peak pressure: 53 mm Hg (S). - Systemic veins: IVC measured 2.2 cm with > 50% respirophasic   variation, suggesting RA pressure 8 mmHg.   Impressions:   - Normal LV size with moderate LV hypertrophy. EF 55-60%. Severe   aortic stenosis with mild aortic insufficiency. Normal RV size   and systolic function. Moderate pulmonary hypertension.   Aortic valve:   Trileaflet; severely calcified leaflets.  Doppler:  There was severe stenosis.   There was mild regurgitation.    VTI ratio of LVOT to aortic valve:  0.36. Valve area (VTI): 1.09 cm^2. Indexed valve area (VTI): 0.54 cm^2/m^2. Peak velocity ratio of LVOT to aortic valve: 0.34. Valve area (Vmax): 1.03 cm^2. Indexed valve area (Vmax): 0.51 cm^2/m^2. Mean velocity ratio of LVOT to aortic valve: 0.36. Valve area (Vmean): 1.08 cm^2. Indexed valve area (Vmean): 0.54 cm^2/m^2.    Mean gradient (S): 47 mm Hg. Peak gradient (S): 71 mm Hg.    ASSESSMENT:    1. Nonrheumatic aortic valve stenosis   2. Essential hypertension   3. Mitral valve disorder   4. Mitral valve mass   5. History of stroke   6. PAF (paroxysmal atrial fibrillation) (HCC)   7. Leg swelling   8. Anemia, unspecified type      PLAN:  In order of problems listed above:  Nonrheumatic aortic stenosis now severe on 2D echo with worsening dyspnea on exertion with little activity.  Dr. Anne Fu reviewed and recommends right and left heart catheterization this Thursday with Dr. Katrinka Blazing.  Check labs today. I have reviewed the risks, indications, and alternatives to angioplasty and stenting with the patient. Risks include but are not limited to bleeding, infection, vascular injury, stroke, myocardial infection, arrhythmia, kidney injury, radiation-related injury in the case of prolonged fluoroscopy use, emergency cardiac surgery, and death. The patient understands the risks of serious complication is low (<1%) and patient agrees to proceed.    Essential hypertension blood pressure controlled  Mitral valve disorder with previous mass leading to stroke on Plavix-continue  PAF status post cardioversion 06/2017 on Xarelto and normal sinus rhythm today.  Will hold Xarelto 48 hours prior to cath  Leg swelling probably from excessive sodium in the past week.  I told her to take an extra hydrochlorothiazide today when she gets home.  Lungs are clear.  No JVD.  Checking labs today for precath.  Anemia with hemoglobin of 9.5.  Denies any bleeding black stools blood in the stool.  Told her to  call PCP today for work-up.   Medication Adjustments/Labs and Tests Ordered:  Current medicines are reviewed at length with the patient today.  Concerns regarding medicines are outlined above.  Medication changes, Labs and Tests ordered today are listed in the Patient Instructions below. Patient Instructions  Medication Instructions:  Your physician recommends that you continue on your current medications as directed. Please refer to the Current Medication list given to you today. 1. Take extra HCTZ today only.  Labwork: Your physician recommends that you have lab work today: bmet/cbc/pt/inr   Testing/Procedures: -None  Follow-Up: Your physician recommends that you keep your scheduled  follow-up appointment with Dr. Anne Fu.   Any Other Special Instructions Will Be Listed Below (If Applicable).   Your provider has recommended a cardiac catherization  You are scheduled for a cardiac catheterization on Thursday, May 30 @ 12:00 pm   with Dr. Katrinka Blazing.  or associate.  Please arrive at the [facility] (Main Entrance) at [facility] at [address] [place] -  2nd Floor Short Stay on Thursday, May 30 @ 10:000 am.    Special note: Every effort is made to have your procedure done on time.   Please understand that emergencies sometimes delay a scheduled   procedure.  No solid feeds after midnight on Wednesday, May 29.  You may have clear liquids until 5 am on the day of your procedure.  On the morning of your procedure, take your medications except what is listed below.  You may take your morning medications with a sip of water on the day of your procedure.  Please take a baby aspirin (81 mg) on the morning of your procedure.   Medications to HOLD - Xarelto two days prior to procedure.   HOLD - Metformin the day of and 48 hours after procedure.   HOLD HCTZ the day of.  Plan for a one night stay -- bring personal belongings.  Bring a current list of your medications and  current insurance cards.  You MUST have a responsible person to drive you home. Someone MUST be with you the first 24 hours after you arrive home or your discharge will be delayed. Wear clothes that are easy to get on and off and wear slip on shoes.    Coronary Angiogram A coronary angiogram, also called coronary angiography, is an X-ray procedure used to look at the arteries in the heart. In this procedure, a dye (contrast dye) is injected through a long, hollow tube (catheter). The catheter is about the size of a piece of cooked spaghetti and is inserted through your groin, wrist, or arm. The dye is injected into each artery, and X-rays are then taken to show if there is a blockage in the arteries of your heart.  LET [facility] CARE PROVIDER KNOW ABOUT:  Any allergies you have, including allergies to shellfish or contrast dye.    All medicines you are taking, including vitamins, herbs, eye drops, creams, and over-the-counter medicines.    Previous problems you or members of your family have had with the use of anesthetics.    Any blood disorders you have.    Previous surgeries you have had.  History of kidney problems or failure.    Other medical conditions you have.  RISKS AND COMPLICATIONS  Generally, a coronary angiogram is a safe procedure. However, about 1 person out of 1000 can have problems that may include:  Allergic reaction to the dye.  Bleeding/bruising from the access site or other locations.  Kidney injury, especially in people with impaired kidney function.  Stroke (rare).  Heart attack (rare).  Irregular rhythms (rare)  Death (rare)  BEFORE THE PROCEDURE   Do not eat or drink anything after midnight the night before the procedure or as directed by your health care provider.    Ask your health care provider about changing or stopping your regular medicines. This is especially important if you are taking diabetes medicines or blood  thinners.  PROCEDURE  You may be given a medicine to help you relax (sedative) before the procedure. This medicine is given through an intravenous (IV) access tube that is inserted into one of your veins.    The area where the catheter will be inserted will be washed and shaved. This is usually done in the groin but may be done in the fold of your arm (near your elbow) or in the wrist.     A medicine will be given to numb the area where the catheter will be inserted (local anesthetic).    The health care provider will insert the catheter into an artery. The catheter will be guided by using a special type of X-ray (fluoroscopy) of the blood vessel being examined.    A special dye will then be injected into the catheter, and X-rays will be taken. The dye will help to show where any narrowing or blockages are located in the heart arteries.     AFTER THE PROCEDURE   If the procedure is done through the leg, you will be kept in bed lying flat for several hours. You will be instructed to not bend or cross your legs.  The insertion site will be checked frequently.    The pulse in your feet or wrist will be checked frequently.    Additional blood tests, X-rays, and an electrocardiogram may be done.      If you need a refill on your cardiac medications before your next appointment, please call your pharmacy.      Elson Clan, PA-C  03/04/2018 12:13 PM    [facility] [address] [place], [place], [place]  [address] Phone: [phone]; Fax: [phone]

## 2018-03-04 NOTE — Patient Instructions (Addendum)
Medication Instructions:  Your physician recommends that you continue on your current medications as directed. Please refer to the Current Medication list given to you today. 1. Take extra HCTZ today only.  Labwork: Your physician recommends that you have lab work today: bmet/cbc/pt/inr   Testing/Procedures: -None  Follow-Up: Your physician recommends that you keep your scheduled  follow-up appointment with Dr. Anne Fu.   Any Other Special Instructions Will Be Listed Below (If Applicable).   Your provider has recommended a cardiac catherization  You are scheduled for a cardiac catheterization on Thursday, May 30 @ 12:00 pm   with Dr. Katrinka Blazing.  or associate.  Please arrive at the [facility] (Main Entrance) at [facility] at [address] [place] -  2nd Floor Short Stay on Thursday, May 30 @ 10:000 am.    Special note: Every effort is made to have your procedure done on time.   Please understand that emergencies sometimes delay a scheduled   procedure.  No solid feeds after midnight on Wednesday, May 29.  You may have clear liquids until 5 am on the day of your procedure.  On the morning of your procedure, take your medications except what is listed below.  You may take your morning medications with a sip of water on the day of your procedure.  Please take a baby aspirin (81 mg) on the morning of your procedure.   Medications to HOLD - Xarelto two days prior to procedure.   HOLD - Metformin the day of and 48 hours after procedure.   HOLD HCTZ the day of.  Plan for a one night stay -- bring personal belongings.  Bring a current list of your medications and current insurance cards.  You MUST have a responsible person to drive you home. Someone MUST be with you the first 24 hours after you arrive home or your discharge will be delayed. Wear clothes that are easy to get on and off and wear slip on shoes.    Coronary Angiogram A coronary angiogram, also called coronary  angiography, is an X-ray procedure used to look at the arteries in the heart. In this procedure, a dye (contrast dye) is injected through a long, hollow tube (catheter). The catheter is about the size of a piece of cooked spaghetti and is inserted through your groin, wrist, or arm. The dye is injected into each artery, and X-rays are then taken to show if there is a blockage in the arteries of your heart.  LET [facility] CARE PROVIDER KNOW ABOUT:  Any allergies you have, including allergies to shellfish or contrast dye.    All medicines you are taking, including vitamins, herbs, eye drops, creams, and over-the-counter medicines.    Previous problems you or members of your family have had with the use of anesthetics.    Any blood disorders you have.    Previous surgeries you have had.  History of kidney problems or failure.    Other medical conditions you have.  RISKS AND COMPLICATIONS  Generally, a coronary angiogram is a safe procedure. However, about 1 person out of 1000 can have problems that may include:  Allergic reaction to the dye.  Bleeding/bruising from the access site or other locations.  Kidney injury, especially in people with impaired kidney function.   Stroke (rare).  Heart attack (rare).  Irregular rhythms (rare)  Death (rare)  BEFORE THE PROCEDURE   Do not eat or drink anything after midnight the night before the procedure or as  directed by your health care provider.    Ask your health care provider about changing or stopping your regular medicines. This is especially important if you are taking diabetes medicines or blood thinners.  PROCEDURE  You may be given a medicine to help you relax (sedative) before the procedure. This medicine is given through an intravenous (IV) access tube that is inserted into one of your veins.    The area where the catheter will be inserted will be washed and shaved. This is usually done in the groin but may be done in the  fold of your arm (near your elbow) or in the wrist.     A medicine will be given to numb the area where the catheter will be inserted (local anesthetic).    The health care provider will insert the catheter into an artery. The catheter will be guided by using a special type of X-ray (fluoroscopy) of the blood vessel being examined.    A special dye will then be injected into the catheter, and X-rays will be taken. The dye will help to show where any narrowing or blockages are located in the heart arteries.     AFTER THE PROCEDURE   If the procedure is done through the leg, you will be kept in bed lying flat for several hours. You will be instructed to not bend or cross your legs.  The insertion site will be checked frequently.    The pulse in your feet or wrist will be checked frequently.    Additional blood tests, X-rays, and an electrocardiogram may be done.      If you need a refill on your cardiac medications before your next appointment, please call your pharmacy.

## 2018-03-06 ENCOUNTER — Encounter (HOSPITAL_COMMUNITY)
Admission: RE | Disposition: A | Discharge status: Home / Self Care | Payer: Self-pay | Source: Ambulatory Visit | Attending: Interventional Cardiology

## 2018-03-06 ENCOUNTER — Ambulatory Visit (HOSPITAL_COMMUNITY)
Admission: RE | Admit: 2018-03-06 | Discharge: 2018-03-06 | Disposition: A | Discharge status: Home / Self Care | Payer: Medicare Other | Source: Ambulatory Visit | Attending: Interventional Cardiology | Admitting: Interventional Cardiology

## 2018-03-06 DIAGNOSIS — Z888 Allergy status to other drugs, medicaments and biological substances status: Secondary | ICD-10-CM | POA: Insufficient documentation

## 2018-03-06 DIAGNOSIS — M159 Polyosteoarthritis, unspecified: Secondary | ICD-10-CM | POA: Insufficient documentation

## 2018-03-06 DIAGNOSIS — E119 Type 2 diabetes mellitus without complications: Secondary | ICD-10-CM

## 2018-03-06 DIAGNOSIS — E7800 Pure hypercholesterolemia, unspecified: Secondary | ICD-10-CM | POA: Diagnosis not present

## 2018-03-06 DIAGNOSIS — Z823 Family history of stroke: Secondary | ICD-10-CM | POA: Diagnosis not present

## 2018-03-06 DIAGNOSIS — Z79899 Other long term (current) drug therapy: Secondary | ICD-10-CM | POA: Insufficient documentation

## 2018-03-06 DIAGNOSIS — I5032 Chronic diastolic (congestive) heart failure: Secondary | ICD-10-CM | POA: Insufficient documentation

## 2018-03-06 DIAGNOSIS — I110 Hypertensive heart disease with heart failure: Secondary | ICD-10-CM | POA: Insufficient documentation

## 2018-03-06 DIAGNOSIS — R0609 Other forms of dyspnea: Secondary | ICD-10-CM | POA: Diagnosis not present

## 2018-03-06 DIAGNOSIS — Z90710 Acquired absence of both cervix and uterus: Secondary | ICD-10-CM | POA: Insufficient documentation

## 2018-03-06 DIAGNOSIS — Z7902 Long term (current) use of antithrombotics/antiplatelets: Secondary | ICD-10-CM | POA: Diagnosis not present

## 2018-03-06 DIAGNOSIS — D649 Anemia, unspecified: Secondary | ICD-10-CM | POA: Diagnosis not present

## 2018-03-06 DIAGNOSIS — Z96651 Presence of right artificial knee joint: Secondary | ICD-10-CM | POA: Insufficient documentation

## 2018-03-06 DIAGNOSIS — Z7901 Long term (current) use of anticoagulants: Secondary | ICD-10-CM | POA: Diagnosis not present

## 2018-03-06 DIAGNOSIS — H269 Unspecified cataract: Secondary | ICD-10-CM | POA: Insufficient documentation

## 2018-03-06 DIAGNOSIS — I272 Pulmonary hypertension, unspecified: Secondary | ICD-10-CM | POA: Diagnosis not present

## 2018-03-06 DIAGNOSIS — Z7984 Long term (current) use of oral hypoglycemic drugs: Secondary | ICD-10-CM | POA: Diagnosis not present

## 2018-03-06 DIAGNOSIS — Z8249 Family history of ischemic heart disease and other diseases of the circulatory system: Secondary | ICD-10-CM | POA: Insufficient documentation

## 2018-03-06 DIAGNOSIS — Z8673 Personal history of transient ischemic attack (TIA), and cerebral infarction without residual deficits: Secondary | ICD-10-CM | POA: Insufficient documentation

## 2018-03-06 DIAGNOSIS — K219 Gastro-esophageal reflux disease without esophagitis: Secondary | ICD-10-CM | POA: Diagnosis not present

## 2018-03-06 DIAGNOSIS — I480 Paroxysmal atrial fibrillation: Secondary | ICD-10-CM | POA: Diagnosis not present

## 2018-03-06 DIAGNOSIS — Z881 Allergy status to other antibiotic agents status: Secondary | ICD-10-CM | POA: Insufficient documentation

## 2018-03-06 DIAGNOSIS — Z9889 Other specified postprocedural states: Secondary | ICD-10-CM | POA: Insufficient documentation

## 2018-03-06 DIAGNOSIS — I429 Cardiomyopathy, unspecified: Secondary | ICD-10-CM | POA: Insufficient documentation

## 2018-03-06 DIAGNOSIS — I350 Nonrheumatic aortic (valve) stenosis: Secondary | ICD-10-CM

## 2018-03-06 DIAGNOSIS — I10 Essential (primary) hypertension: Secondary | ICD-10-CM | POA: Diagnosis present

## 2018-03-06 HISTORY — PX: RIGHT/LEFT HEART CATH AND CORONARY ANGIOGRAPHY: CATH118266

## 2018-03-06 LAB — POCT I-STAT 3, VENOUS BLOOD GAS (G3P V)
Acid-Base Excess: 2 mmol/L (ref 0.0–2.0)
Acid-Base Excess: 2 mmol/L (ref 0.0–2.0)
Bicarbonate: 27.2 mmol/L (ref 20.0–28.0)
Bicarbonate: 27.3 mmol/L (ref 20.0–28.0)
O2 Saturation: 68 %
O2 Saturation: 95 %
PCO2 VEN: 42.9 mmHg — AB (ref 44.0–60.0)
PCO2 VEN: 45.5 mmHg (ref 44.0–60.0)
PH VEN: 7.384 (ref 7.250–7.430)
PH VEN: 7.411 (ref 7.250–7.430)
PO2 VEN: 36 mmHg (ref 32.0–45.0)
PO2 VEN: 74 mmHg — AB (ref 32.0–45.0)
TCO2: 29 mmol/L (ref 22–32)
TCO2: 29 mmol/L (ref 22–32)

## 2018-03-06 LAB — GLUCOSE, CAPILLARY: GLUCOSE-CAPILLARY: 109 mg/dL — AB (ref 65–99)

## 2018-03-06 SURGERY — RIGHT/LEFT HEART CATH AND CORONARY ANGIOGRAPHY
Anesthesia: LOCAL

## 2018-03-06 MED ORDER — ACETAMINOPHEN 325 MG PO TABS: 650.0000 mg | ORAL_TABLET | ORAL | Status: DC | PRN

## 2018-03-06 MED ORDER — HEPARIN (PORCINE) IN NACL 2-0.9 UNITS/ML
INTRAMUSCULAR | Status: AC | PRN
  Administered 2018-03-06 (×2): 500 mL

## 2018-03-06 MED ORDER — HEPARIN SODIUM (PORCINE) 1000 UNIT/ML IJ SOLN
INTRAMUSCULAR | Status: DC | PRN
  Administered 2018-03-06: 4000 [IU] via INTRAVENOUS

## 2018-03-06 MED ORDER — SODIUM CHLORIDE 0.9 % IV SOLN: 250.0000 mL | INTRAVENOUS | Status: DC | PRN

## 2018-03-06 MED ORDER — ASPIRIN 81 MG PO CHEW: 81.0000 mg | CHEWABLE_TABLET | ORAL | Status: DC

## 2018-03-06 MED ORDER — MIDAZOLAM HCL 2 MG/2ML IJ SOLN
INTRAMUSCULAR | Status: DC | PRN
  Administered 2018-03-06: 1 mg via INTRAVENOUS

## 2018-03-06 MED ORDER — MIDAZOLAM HCL 2 MG/2ML IJ SOLN
INTRAMUSCULAR | Status: AC
  Filled 2018-03-06: qty 2

## 2018-03-06 MED ORDER — SODIUM CHLORIDE 0.9% FLUSH: 3.0000 mL | Freq: Two times a day (BID) | INTRAVENOUS | Status: DC

## 2018-03-06 MED ORDER — SODIUM CHLORIDE 0.9% FLUSH: 3.0000 mL | INTRAVENOUS | Status: DC | PRN

## 2018-03-06 MED ORDER — LIDOCAINE HCL (PF) 1 % IJ SOLN
INTRAMUSCULAR | Status: DC | PRN
  Administered 2018-03-06 (×2): 2 mL

## 2018-03-06 MED ORDER — HEPARIN SODIUM (PORCINE) 1000 UNIT/ML IJ SOLN
INTRAMUSCULAR | Status: AC
  Filled 2018-03-06: qty 1

## 2018-03-06 MED ORDER — LIDOCAINE HCL (PF) 1 % IJ SOLN
INTRAMUSCULAR | Status: AC
  Filled 2018-03-06: qty 30

## 2018-03-06 MED ORDER — SODIUM CHLORIDE 0.9 % WEIGHT BASED INFUSION: 1.0000 mL/kg/h | INTRAVENOUS | Status: DC

## 2018-03-06 MED ORDER — SODIUM CHLORIDE 0.9 % WEIGHT BASED INFUSION
3.0000 mL/kg/h | INTRAVENOUS | Status: AC
  Administered 2018-03-06: 3 mL/kg/h via INTRAVENOUS

## 2018-03-06 MED ORDER — FENTANYL CITRATE (PF) 100 MCG/2ML IJ SOLN
INTRAMUSCULAR | Status: DC | PRN
  Administered 2018-03-06: 25 ug via INTRAVENOUS

## 2018-03-06 MED ORDER — VERAPAMIL HCL 2.5 MG/ML IV SOLN
INTRAVENOUS | Status: AC
  Filled 2018-03-06: qty 2

## 2018-03-06 MED ORDER — ONDANSETRON HCL 4 MG/2ML IJ SOLN: 4.0000 mg | Freq: Four times a day (QID) | INTRAMUSCULAR | Status: DC | PRN

## 2018-03-06 MED ORDER — VERAPAMIL HCL 2.5 MG/ML IV SOLN
INTRAVENOUS | Status: DC | PRN
  Administered 2018-03-06: 10 mL via INTRA_ARTERIAL

## 2018-03-06 MED ORDER — HEPARIN (PORCINE) IN NACL 1000-0.9 UT/500ML-% IV SOLN
INTRAVENOUS | Status: AC
  Filled 2018-03-06: qty 1000

## 2018-03-06 MED ORDER — SODIUM CHLORIDE 0.9 % IV SOLN: INTRAVENOUS | Status: DC

## 2018-03-06 MED ORDER — IOHEXOL 350 MG/ML SOLN
INTRAVENOUS | Status: DC | PRN
  Administered 2018-03-06: 115 mL

## 2018-03-06 MED ORDER — FENTANYL CITRATE (PF) 100 MCG/2ML IJ SOLN
INTRAMUSCULAR | Status: AC
  Filled 2018-03-06: qty 2

## 2018-03-06 SURGICAL SUPPLY — 19 items
CATH BALLN WEDGE 5F 110CM (CATHETERS) ×1 IMPLANT
CATH INFINITI 5 FR JL3.5 (CATHETERS) ×1 IMPLANT
CATH INFINITI JR4 5F (CATHETERS) ×1 IMPLANT
CATH LAUNCHER 5F JR4 (CATHETERS) ×1 IMPLANT
CATH LAUNCHER 5F RADR (CATHETERS) IMPLANT
CATHETER LAUNCHER 5F RADR (CATHETERS) ×2
COVER PRB 48X5XTLSCP FOLD TPE (BAG) IMPLANT
COVER PROBE 5X48 (BAG) ×2
DEVICE RAD COMP TR BAND LRG (VASCULAR PRODUCTS) ×1 IMPLANT
GLIDESHEATH SLEND A-KIT 6F 22G (SHEATH) ×1 IMPLANT
GUIDEWIRE .025 260CM (WIRE) ×1 IMPLANT
GUIDEWIRE INQWIRE 1.5J.035X260 (WIRE) IMPLANT
INQWIRE 1.5J .035X260CM (WIRE) ×2
KIT HEART LEFT (KITS) ×2 IMPLANT
PACK CARDIAC CATHETERIZATION (CUSTOM PROCEDURE TRAY) ×2 IMPLANT
SHEATH RAIN 4/5FR (SHEATH) ×1 IMPLANT
TRANSDUCER W/STOPCOCK (MISCELLANEOUS) ×2 IMPLANT
TUBING CIL FLEX 10 FLL-RA (TUBING) ×2 IMPLANT
WIRE EMERALD ST .035X150CM (WIRE) ×1 IMPLANT

## 2018-03-06 NOTE — Discharge Instructions (Signed)
**Note Janet Williamson-identified via Obfuscation** Radial Site Care °Refer to this sheet in the next few weeks. These instructions provide you with information about caring for yourself after your procedure. Your health care provider may also give you more specific instructions. Your treatment has been planned according to current medical practices, but problems sometimes occur. Call your health care provider if you have any problems or questions after your procedure. °What can I expect after the procedure? °After your procedure, it is typical to have the following: °· Bruising at the radial site that usually fades within 1-2 weeks. °· Blood collecting in the tissue (hematoma) that may be painful to the touch. It should usually decrease in size and tenderness within 1-2 weeks. ° °Follow these instructions at home: °· Take medicines only as directed by your health care provider. °· You may shower 24-48 hours after the procedure or as directed by your health care provider. Remove the bandage (dressing) and gently wash the site with plain soap and water. Pat the area dry with a clean towel. Do not rub the site, because this may cause bleeding. °· Do not take baths, swim, or use a hot tub until your health care provider approves. °· Check your insertion site every day for redness, swelling, or drainage. °· Do not apply powder or lotion to the site. °· Do not flex or bend the affected arm for 24 hours or as directed by your health care provider. °· Do not push or pull heavy objects with the affected arm for 24 hours or as directed by your health care provider. °· Do not lift over 10 lb (4.5 kg) for 5 days after your procedure or as directed by your health care provider. °· Ask your health care provider when it is okay to: °? Return to work or school. °? Resume usual physical activities or sports. °? Resume sexual activity. °· Do not drive home if you are discharged the same day as the procedure. Have someone else drive you. °· You may drive 24 hours after the procedure  unless otherwise instructed by your health care provider. °· Do not operate machinery or power tools for 24 hours after the procedure. °· If your procedure was done as an outpatient procedure, which means that you went home the same day as your procedure, a responsible adult should be with you for the first 24 hours after you arrive home. °· Keep all follow-up visits as directed by your health care provider. This is important. °Contact a health care provider if: °· You have a fever. °· You have chills. °· You have increased bleeding from the radial site. Hold pressure on the site. °Get help right away if: °· You have unusual pain at the radial site. °· You have redness, warmth, or swelling at the radial site. °· You have drainage (other than a small amount of blood on the dressing) from the radial site. °· The radial site is bleeding, and the bleeding does not stop after 30 minutes of holding steady pressure on the site. °· Your arm or hand becomes pale, cool, tingly, or numb. °This information is not intended to replace advice given to you by your health care provider. Make sure you discuss any questions you have with your health care provider. °Document Released: 10/27/2010 Document Revised: 03/01/2016 Document Reviewed: 04/12/2014 °[employer]vier Interactive Patient Education © 2018 [employer] ° °

## 2018-03-06 NOTE — Interval H&P Note (Signed)
Cath Lab Visit (complete for each Cath Lab visit)  Clinical Evaluation Leading to the Procedure:   ACS: No.  Non-ACS:    Anginal Classification: CCS III  Anti-ischemic medical therapy: Minimal Therapy (1 class of medications)  Non-Invasive Test Results: No non-invasive testing performed  Prior CABG: No previous CABG      History and Physical Interval Note:  03/06/2018 1:11 PM  Janet Williamson  has presented today for surgery, with the diagnosis of as  The various methods of treatment have been discussed with the patient and family. After consideration of risks, benefits and other options for treatment, the patient has consented to  Procedure(s): RIGHT/LEFT HEART CATH AND CORONARY ANGIOGRAPHY (N/A) as a surgical intervention .  The patient's history has been reviewed, patient examined, no change in status, stable for surgery.  I have reviewed the patient's chart and labs.  Questions were answered to the patient's satisfaction.     Lyn Records III

## 2018-03-07 ENCOUNTER — Encounter (HOSPITAL_COMMUNITY): Payer: Self-pay | Admitting: Interventional Cardiology

## 2018-03-07 MED FILL — Heparin Sod (Porcine)-NaCl IV Soln 1000 Unit/500ML-0.9%: INTRAVENOUS | Qty: 1000 | Status: AC

## 2018-03-10 ENCOUNTER — Other Ambulatory Visit: Payer: Self-pay | Admitting: Endocrinology

## 2018-03-10 ENCOUNTER — Telehealth: Payer: Self-pay | Admitting: Cardiology

## 2018-03-10 NOTE — Telephone Encounter (Signed)
Pt aware of recommendation by Dr Katrinka Blazing for her to be seen in the Aortic Valve Clinic.  Advised I will review with Dr Anne Fu and contact her with further instructions.

## 2018-03-10 NOTE — Telephone Encounter (Signed)
  RECOMMENDATIONS:   There is discordance between echo and hemodynamic data with reference to severity of aortic valve disease.  There is no other explanation for the patient's progressive dyspnea.  I would recommend referring the patient to the aortic valve clinic to determine if proceeding with valve therapy is reasonable. Per Dr Michaelle Copas cardiac cath note from 03/06/2018.

## 2018-03-10 NOTE — Telephone Encounter (Signed)
New Message   Pt states that after her procedure Dr. Katrinka Blazing told her she would need to be referred to another doctor due to he having a clogged valve, and she wants to know more information about who she is being referred to. Notes in epic. Please call

## 2018-03-13 NOTE — Telephone Encounter (Signed)
appt scheduled for 6/11 - Pt aware.

## 2018-03-13 NOTE — Telephone Encounter (Signed)
Refer to aortic valve clinic Thanks  Donato Schultz, MD

## 2018-03-18 ENCOUNTER — Encounter: Payer: Self-pay | Admitting: Cardiovascular Disease

## 2018-03-18 ENCOUNTER — Ambulatory Visit (INDEPENDENT_AMBULATORY_CARE_PROVIDER_SITE_OTHER): Payer: Medicare Other | Admitting: Cardiovascular Disease

## 2018-03-18 VITALS — BP 136/68 | HR 71 | Ht 63.5 in | Wt 192.6 lb

## 2018-03-18 DIAGNOSIS — I350 Nonrheumatic aortic (valve) stenosis: Secondary | ICD-10-CM | POA: Diagnosis not present

## 2018-03-18 NOTE — Progress Notes (Signed)
Cardiology Office Note Date:  03/19/2018   ID:  Janet, Williamson 1941-02-09, MRN [id number]  PCP:  Maurice Small, MD  Cardiologist:  Donato Schultz, MD  Chief Complaint  Patient presents with  . Shortness of Breath     History of Present Illness: Janet Williamson is a 77 y.o. female who presents for evaluation of severe aortic stenosis.   The patient is here alone today.  She has a history of aortic stenosis and has been followed closely by Dr. Anne Fu with serial echo studies and clinical exams.  Her history dates back to approximately 2008 when she suffered an embolic stroke.  She subsequently was noted to have a "mass" on her mitral valve.  She was evaluated by Dr. Laneta Simmers and further imaging with cardiac MRI was performed.  After review with colleagues and extensive discussion with the patient, observation and medical therapy was recommended.  She was felt to likely have a benign calcified mass that could represent a calcified fibroblastoma or papilloma.  She has done well from a clinical perspective since that time.  However, over the past year she admits to progressive shortness of breath with activity.  She denies orthopnea, PND, or leg swelling.  She has not had any heart palpitations, exertional chest pain or pressure, lightheadedness, or syncope.  She is short of breath with walking a short distance on level ground and her symptoms have progressed over the last several months.  She has not had resting symptoms.  The patient has undergone a surface echocardiogram demonstrating normal LV systolic function, severe aortic stenosis with a mean transvalvular gradient of 47 mmHg, mild aortic insufficiency, and mild mitral regurgitation.  The mitral valve leaflets are calcified but a mass is not visualized on this recent follow-up echo.  The patient also underwent cardiac catheterization demonstrating widely patent coronary arteries without obstructive disease.  The aortic valve is calcified  and there is also mitral annular calcification noted.  By cardiac catheterization, peak and mean transaortic valve gradients are 32 and 22 mmHg, respectively.  Past Medical History:  Diagnosis Date  . Anemia   . Aortic stenosis    moderate AS 10/2012 echo (Dr. Anne Fu)  . Cardiomyopathy in other disease   . Cataracts, bilateral   . Diabetes mellitus   . Elevated cholesterol   . Generalized osteoarthritis   . GERD (gastroesophageal reflux disease)   . Heart murmur   . Hypertension   . Mitral valve disorder   . Phlebitis   . Stroke (HCC)   . Thrombophlebitis     Past Surgical History:  Procedure Laterality Date  . ABDOMINAL HYSTERECTOMY    . EYE SURGERY     cataract removal  . RIGHT/LEFT HEART CATH AND CORONARY ANGIOGRAPHY N/A 03/06/2018   Procedure: RIGHT/LEFT HEART CATH AND CORONARY ANGIOGRAPHY;  Surgeon: Lyn Records, MD;  Location: [facility];  Service: Cardiovascular;  Laterality: N/A;  . TOTAL KNEE ARTHROPLASTY Right 04/14/2013   Dr August Saucer  . TOTAL KNEE ARTHROPLASTY Right 04/14/2013   Procedure: TOTAL KNEE ARTHROPLASTY;  Surgeon: Cammy Copa, MD;  Location: [facility] OR;  Service: Orthopedics;  Laterality: Right;    Current Outpatient Medications  Medication Sig Dispense Refill  . amLODipine (NORVASC) 5 MG tablet Take 5 mg by mouth daily.    . cholecalciferol (VITAMIN D) 1000 UNITS tablet Take 1,000 Units by mouth daily.    . hydrochlorothiazide (HYDRODIURIL) 25 MG tablet Take 25 mg by mouth daily.    Marland Kitchen  metFORMIN (GLUCOPHAGE-XR) 750 MG 24 hr tablet TAKE 2 TABLETS BY MOUTH ONCE DAILY WITH BREAKFAST 60 tablet 3  . metoprolol succinate (TOPROL-XL) 50 MG 24 hr tablet Take 50 mg by mouth daily.     Bertram Gala Glycol-Propyl Glycol (SYSTANE OP) Apply 1 drop to eye 2 (two) times daily.    . potassium chloride (KLOR-CON 10) 10 MEQ tablet Take 1 tablet (10 mEq total) by mouth daily. 30 tablet 0  . rivaroxaban (XARELTO) 20 MG TABS tablet Take 1 tablet (20 mg total) daily with  supper by mouth. 30 tablet 11  . simvastatin (ZOCOR) 20 MG tablet Take 20 mg by mouth every evening.     No current facility-administered medications for this visit.     Allergies:   Ace inhibitors; Keflex [cephalexin]; and Rifadin [rifampin]   Social History:  The patient  reports that she has never smoked. She has never used smokeless tobacco. She reports that she does not drink alcohol or use drugs.   Family History:  The patient's  family history includes Hypertension in her father and mother; Stroke in her mother.    ROS:  Please see the history of present illness.  Otherwise, review of systems is positive for knee pain.  All other systems are reviewed and negative.    PHYSICAL EXAM: VS:  BP 136/68   Pulse 71   Ht 5' 3.5" (1.613 m)   Wt 192 lb 9.6 oz (87.4 kg)   SpO2 96%   BMI 33.58 kg/m  , BMI Body mass index is 33.58 kg/m. GEN: Well nourished, well developed, in no acute distress  HEENT: normal  Neck: no JVD, no masses.  Bilateral carotid bruits Cardiac: RRR with 4/6 harsh late peaking systolic murmur at the right upper sternal border, no diastolic murmur              Respiratory:  clear to auscultation bilaterally, normal work of breathing GI: soft, nontender, nondistended, + BS MS: no deformity or atrophy  Ext: Trace bilateral pretibial edema, pedal pulses 2+= bilaterally Skin: warm and dry, no rash Neuro:  Strength and sensation are intact Psych: euthymic mood, full affect  EKG:  EKG is not ordered today.  Recent Labs: 03/04/2018: BUN 8; Creatinine, Ser 0.77; Hemoglobin 9.0; Platelets 502; Potassium 3.9; Sodium 141   Lipid Panel     Component Value Date/Time   CHOL 121 04/23/2017 1022   TRIG 84.0 04/23/2017 1022   HDL 38.10 (L) 04/23/2017 1022   CHOLHDL 3 04/23/2017 1022   VLDL 16.8 04/23/2017 1022   LDLCALC 66 04/23/2017 1022      Wt Readings from Last 3 Encounters:  03/18/18 192 lb 9.6 oz (87.4 kg)  03/06/18 195 lb (88.5 kg)  03/04/18 194 lb (88 kg)      Cardiac Studies Reviewed: Echo 02-26-2018: Left ventricle:  The cavity size was normal. Wall thickness was increased in a pattern of moderate LVH. Systolic function was normal. The estimated ejection fraction was in the range of 55% to 60%. Wall motion was normal; there were no regional wall motion abnormalities. Doppler parameters are consistent with abnormal left ventricular relaxation (grade 1 diastolic dysfunction).  ------------------------------------------------------------------- Aortic valve:   Trileaflet; severely calcified leaflets.  Doppler:  There was severe stenosis.   There was mild regurgitation.    VTI ratio of LVOT to aortic valve: 0.36. Valve area (VTI): 1.09 cm^2. Indexed valve area (VTI): 0.54 cm^2/m^2. Peak velocity ratio of LVOT to aortic valve: 0.34. Valve area (Vmax): 1.03  cm^2. Indexed valve area (Vmax): 0.51 cm^2/m^2. Mean velocity ratio of LVOT to aortic valve: 0.36. Valve area (Vmean): 1.08 cm^2. Indexed valve area (Vmean): 0.54 cm^2/m^2.    Mean gradient (S): 47 mm Hg. Peak gradient (S): 71 mm Hg.  ------------------------------------------------------------------- Aorta:  Aortic root: The aortic root was normal in size. Ascending aorta: The ascending aorta was normal in size.  ------------------------------------------------------------------- Mitral valve:   Moderately calcified leaflets .  Doppler:   There was no evidence for stenosis.   There was mild regurgitation. Peak gradient (D): 6 mm Hg.  ------------------------------------------------------------------- Left atrium:  The atrium was mildly dilated.  ------------------------------------------------------------------- Right ventricle:  The cavity size was normal. Systolic function was normal.  ------------------------------------------------------------------- Pulmonic valve:    Structurally normal valve.   Cusp separation was normal.  Doppler:  Transvalvular velocity was within  the normal range. There was trivial regurgitation.  ------------------------------------------------------------------- Tricuspid valve:   Doppler:  There was mild regurgitation.  ------------------------------------------------------------------- Right atrium:  The atrium was normal in size.  ------------------------------------------------------------------- Pericardium:  There was no pericardial effusion.  ------------------------------------------------------------------- Systemic veins:  IVC measured 2.2 cm with > 50% respirophasic variation, suggesting RA pressure 8 mmHg.  ------------------------------------------------------------------- Measurements   Left ventricle                           Value           Reference  LV ID, ED, PLAX chordal          (L)     34.6   mm       43 - 52  LV ID, ES, PLAX chordal                  23     mm       23 - 38  LV fx shortening, PLAX chordal           34     %        >=29  LV PW thickness, ED                      12.3   mm       ---------  IVS/LV PW ratio, ED              (H)     1.41            <=1.3  Stroke volume, 2D                        121    ml       ---------  Stroke volume/bsa, 2D                    60     ml/m^2   ---------  LV e&', lateral                           7.35   cm/s     ---------  LV E/e&', lateral                         16.6            ---------  LV e&', medial  5.44   cm/s     ---------  LV E/e&', medial                          22.43           ---------  LV e&', average                           6.4    cm/s     ---------  LV E/e&', average                         19.08           ---------  LV ejection time                         340    ms       ---------    Ventricular septum                       Value           Reference  IVS thickness, ED                        17.4   mm       ---------    LVOT                                     Value           Reference  LVOT ID, S                                19.6   mm       ---------  LVOT area                                3.02   cm^2     ---------  LVOT ID                                  20     mm       ---------  LVOT peak velocity, S                    142    cm/s     ---------  LVOT mean velocity, S                    113    cm/s     ---------  LVOT VTI, S                              38.4   cm       ---------  LVOT peak gradient, S                    8      mm Hg    ---------  Stroke volume (SV), LVOT DP  115.9  ml       ---------  Stroke index (SV/bsa), LVOT DP           57.7   ml/m^2   ---------    Aortic valve                             Value           Reference  Aortic valve peak velocity, S            421.26 cm/s     ---------  Aortic valve mean velocity, S            314.41 cm/s     ---------  Aortic valve VTI, S                      106.28 cm       ---------  Aortic mean gradient, S                  44     mm Hg    ---------  Aortic peak gradient, S                  71     mm Hg    ---------  VTI ratio, LVOT/AV                       0.36            ---------  Aortic valve area, VTI                   1.09   cm^2     ---------  Aortic valve area/bsa, VTI               0.54   cm^2/m^2 ---------  Velocity ratio, peak, LVOT/AV            0.34            ---------  Aortic valve area, peak velocity         1.03   cm^2     ---------  Aortic valve area/bsa, peak              0.51   cm^2/m^2 ---------  velocity  Velocity ratio, mean, LVOT/AV            0.36            ---------  Aortic valve area, mean velocity         1.08   cm^2     ---------  Aortic valve area/bsa, mean              0.54   cm^2/m^2 ---------  velocity  Aortic regurg peak velocity              158.91 cm/s     ---------  Aortic regurg pressure half-time         339    ms       ---------  Aortic regurg peak gradient              10     mm Hg    ---------    Aorta                                    Value  Reference   Aortic root ID, ED                       29     mm       ---------  Ascending aorta ID, A-P, S               35     mm       ---------    Left atrium                              Value           Reference  LA ID, A-P, ES                           44     mm       ---------  LA ID/bsa, A-P                           2.19   cm/m^2   <=2.2  LA volume, S                             68     ml       ---------  LA volume/bsa, S                         33.9   ml/m^2   ---------  LA volume, ES, 1-p A4C                   51     ml       ---------  LA volume/bsa, ES, 1-p A4C               25.4   ml/m^2   ---------  LA volume, ES, 1-p A2C                   79     ml       ---------  LA volume/bsa, ES, 1-p A2C               39.3   ml/m^2   ---------    Mitral valve                             Value           Reference  Mitral E-wave peak velocity              122    cm/s     ---------  Mitral A-wave peak velocity              149    cm/s     ---------  Mitral deceleration time         (H)     356    ms       150 - 230  Mitral peak gradient, D                  6      mm Hg    ---------  Mitral E/A ratio, peak                   0.8             ---------  Pulmonary arteries                       Value           Reference  PA pressure, S, DP               (H)     53     mm Hg    <=30    Tricuspid valve                          Value           Reference  Tricuspid regurg peak velocity           335    cm/s     ---------  Tricuspid peak RV-RA gradient            45     mm Hg    ---------    Right ventricle                          Value           Reference  RV s&', lateral, S                        7.35   cm/s     ---------  Legend: (L)  and  (H)  mark values outside specified reference range.  ------------------------------------------------------------------- Prepared and Electronically Authenticated by  Marca Ancona, M.D. 2019-05-22T14:57:03  Cardiac Cath 03/06/2018: Conclusion     Calcific aortic stenosis with peak to peak gradient of 32 mmHg.  Valve is heavily calcified by cine fluoroscopy.  Calculated aortic valve area 1.62 cm related to a calculated/estimated abnormally high cardiac output of 7.8 L/min  Technically difficult coronary angiography from the right radial due to heavy calcification in the iliac/subclavian preventing accurate catheter control.  Normal left main  Large widely patent LAD without obstructive disease  Large widely patent circumflex without obstructive disease  RCA never selectively engaged but visualized in the proximal mid and distal vessel is widely patent.  No evidence of pulmonary hypertension   RECOMMENDATIONS:   There is discordance between echo and hemodynamic data with reference to severity of aortic valve disease.  There is no other explanation for the patient's progressive dyspnea.  I would recommend referring the patient to the aortic valve clinic to determine if proceeding with valve therapy is reasonable.    Indications   Aortic stenosis, severe [I35.0 (ICD-10-CM)]  DOE (dyspnea on exertion) [R06.09 (ICD-10-CM)]  Chronic diastolic HF (heart failure), NYHA class 3 (HCC) [I50.32 (ICD-10-CM)]  Procedural Details/Technique   Technical Details The right radial area was sterilely prepped and draped. Intravenous sedation with Versed and fentanyl was administered. 1% Xylocaine was infiltrated to achieve local analgesia. Using real-time vascular ultrasound, a double wall stick with an angiocath was utilized to obtain intra-arterial access. A VUS image was saved for the permanent record.The modified Seldinger technique was used to place a 33F " Slender" sheath in the right radial artery. Weight based heparin was administered. Coronary angiography was done using 5 F catheters. Right coronary angiography was performed with a JR4. Left ventricular hemodymic recordings and angiography was done using the JR 4 catheter and hand  injection. Left coronary angiography was performed with a JL 3.5 cm.  Right heart catheterization was performed by exchanging a previously placed antecubital IV angio-cath for  a 5 [place] Slender sheath. 1% Xylocaine was used to locally nesthetize the area around the IV site. The IV catheter was wired using an .018 guidewire. The modified Seldinger technique was used to place the 5 [place] sheath. Double glove technique was used to enhance sterility. After sheath insertion, right heart cath was performed using a 5 French balloon tipped catheter and fluoroscopic guidance. Pressures were recorded in each chamber and in the pulmonary capillary wedge position.. The main pulmonary artery O2 saturation was sampled.   Hemostasis was achieved using a pneumatic band.  During this procedure the patient is administered a total of Versed 1 mg and Fentanyl 25 mg to achieve and maintain moderate conscious sedation. The patient's heart rate, blood pressure, and oxygen saturation are monitored continuously during the procedure. The period of conscious sedation is 52 minutes, of which I was present face-to-face 100% of this time.   Estimated blood loss <50 mL.  During this procedure the patient was administered the following to achieve and maintain moderate conscious sedation: Versed 1 mg, Fentanyl 25 mcg, while the patient's heart rate, blood pressure, and oxygen saturation were continuously monitored. The period of conscious sedation was 52 minutes, of which I was present face-to-face 100% of this time.  Coronary Findings   Diagnostic  Dominance: Right  No diagnostic findings have been documented.  Intervention   No interventions have been documented.  Right Heart   Right Heart Pressures Hemodynamic findings consistent with pulmonary hypertension. LV EDP is normal.  Left Heart   Left Ventricle LV end diastolic pressure is normal.  Coronary Diagrams   Diagnostic Diagram       Implants    No implant  documentation for this case.  MERGE Images   Show images for CARDIAC CATHETERIZATION   Link to Procedure Log   Procedure Log    Hemo Data    Most Recent Value  Fick Cardiac Output 7.81 L/min  Fick Cardiac Output Index 4.03 (L/min)/BSA  Aortic Mean Gradient 22.1 mmHg  Aortic Peak Gradient 32 mmHg  Aortic Valve Area 1.62  Aortic Value Area Index 0.83 cm2/BSA  RA A Wave 7 mmHg  RA V Wave 1 mmHg  RA Mean 1 mmHg  RV Systolic Pressure 36 mmHg  RV Diastolic Pressure 3 mmHg  RV EDP 7 mmHg  PA Systolic Pressure 31 mmHg  PA Diastolic Pressure 16 mmHg  PA Mean 23 mmHg  PW A Wave 11 mmHg  PW V Wave 8 mmHg  PW Mean 9 mmHg  AO Systolic Pressure 139 mmHg  AO Diastolic Pressure 65 mmHg  AO Mean 95 mmHg  LV Systolic Pressure 175 mmHg  LV Diastolic Pressure 7 mmHg  LV EDP 12 mmHg  Arterial Occlusion Pressure Extended Systolic Pressure 140 mmHg  Arterial Occlusion Pressure Extended Diastolic Pressure 65 mmHg  Arterial Occlusion Pressure Extended Mean Pressure 96 mmHg  Left Ventricular Apex Extended Systolic Pressure 172 mmHg  Left Ventricular Apex Extended Diastolic Pressure 6 mmHg  Left Ventricular Apex Extended EDP Pressure 10 mmHg  QP/QS 1  TPVR Index 5.72 HRUI  TSVR Index 23.61 HRUI  PVR SVR Ratio 0.15  TPVR/TSVR Ratio 0.24   STS Risk Calculator: Risk of Mortality: 3.003% Renal Failure: 4.070% Permanent Stroke: 2.209% Prolonged Ventilation: 10.818% DSW Infection: 0.207% Reoperation: 3.626% Morbidity or Mortality: 15.740% Short Length of Stay: 14.421% Long Length of Stay:  ASSESSMENT AND PLAN: 77 year old woman with severe, stage D1 aortic stenosis, with progressive now New York Heart Association functional class III symptoms of  chronic diastolic heart failure. Her exam, echo findings, and progressive symptoms of shortness of breath are all suggestive of severe aortic stenosis. I have reviewed the natural history of aortic stenosis with the patient today. We have  discussed the limitations of medical therapy and the poor prognosis associated with symptomatic aortic stenosis. We have reviewed potential treatment options, including palliative medical therapy, conventional surgical aortic valve replacement, and transcatheter aortic valve replacement. We discussed treatment options in the context of the patient's specific comorbid medical conditions.   The patient also has a history of heavy mitral annular and valvular calcification with a calcified mass on the mitral valve. This was managed conservatively and she has not had any recurrent stroke or other apparent complication. I have recommended an updated TEE to evaluate her mitral valve anatomy, a CTA of the heart and chest, abdomen, pelvis, for anatomic decision-making regarding her aortic stenosis treatment options, and a cardiac surgical evaluation with Dr Laneta Simmers who has seen her previously. She understands that a multidisciplinary team will discuss her treatment options and likely move forward with either surgical AVR or TAVR in the near future.   Current medicines are reviewed with the patient today.  The patient does not have concerns regarding medicines.  Labs/ tests ordered today include:   Orders Placed This Encounter  Procedures  . CT CORONARY MORPH W/CTA COR W/SCORE W/CA W/CM &/OR WO/CM  . CT ANGIO ABDOMEN PELVIS  W &/OR WO CONTRAST  . CT ANGIO CHEST AORTA W &/OR WO CONTRAST    Disposition:   As outlined above  Signed, Tonny Bollman, MD  03/19/2018 6:37 AM    [facility] [address] [place], [place], [place]  [address] Phone: [phone]; Fax: [phone]

## 2018-03-18 NOTE — Patient Instructions (Addendum)
Medication Instructions:  Your provider recommends that you continue on your current medications as directed. Please refer to the Current Medication list given to you today.    Labwork: None  Testing/Procedures: Dr. Excell Seltzer recommends you have TAVR SCANS.  Your physician has requested that you have a TEE. During a TEE, sound waves are used to create images of your heart. It provides your doctor with information about the size and shape of your heart and how well your heart's chambers and valves are working. In this test, a transducer is attached to the end of a flexible tube that's guided down your throat and into your esophagus (the tube leading from you mouth to your stomach) to get a more detailed image of your heart. You are not awake for the procedure. Please see the instruction sheet given to you today. For further information please visit [email or web address]  Follow-Up: You will be called to arrange an appointment with Dr. Laneta Simmers.   Any Other Special Instructions Will Be Listed Below (If Applicable).  TEE INSTRUCTIONS: You are scheduled for a TEE on April 01, 2018 with Dr. Anne Fu.  Please arrive at the [facility] TOWER MAIN ENTRANCE of [facility] at 8:00 AM the day of your procedure.  1.) Diet:  A.) Nothing to eat or drink after midnight except your medications with a sip of water.  2.) Must have a responsible person to drive you home.  3.) Bring your current insurance cards and current list of all your medications.   *Special Note:  Every effort is made to have your procedure done on time.  Occasionally there are emergencies that present themselves at the hospital that may cause delays.  Please be patient if a delay does occur.   CT INSTRUCTIONS: Your CT scans will be completed at:  [facility] main entrance of [facility] [address] [place], [place] [address] [phone]  You will be called to confirm date and time. Proceed  to the [facility] Radiology Department (First Floor).  Please follow these instructions carefully (unless otherwise directed):  On the Night Before the Test: . Drink plenty of water. . Do not consume any caffeinated/decaffeinated beverages or chocolate 12 hours prior to your test. . Do not take any antihistamines 12 hours prior to your test. . If you take Metformin do not take 24 hours prior to test.  On the Day of the Test: . Drink plenty of water. Do not drink any water within one hour of the test. . Do not eat any food 4 hours prior to the test. . You may take your regular medications prior to the test. . MAKE SURE TO  TAKE YOUR TOPROL AS DIRECTED.  After the Test: . Drink plenty of water. . After receiving IV contrast, you may experience a mild flushed feeling. This is normal. . On occasion, you may experience a mild rash up to 24 hours after the test. This is not dangerous. If this occurs, you can take Benadryl 25 mg and increase your fluid intake. . If you experience trouble breathing, this can be serious. If it is severe call 911 IMMEDIATELY. If it is mild, please call our office. . Do not take your Metformin for 48 hours after completing test.

## 2018-03-18 NOTE — H&P (View-Only) (Signed)
Cardiology Office Note Date:  03/19/2018   ID:  Janet Williamson, Janet Williamson 1941-02-09, MRN [id number]  PCP:  Maurice Small, MD  Cardiologist:  Donato Schultz, MD  Chief Complaint  Patient presents with  . Shortness of Breath     History of Present Illness: Janet Williamson is a 77 y.o. female who presents for evaluation of severe aortic stenosis.   The patient is here alone today.  She has a history of aortic stenosis and has been followed closely by Dr. Anne Fu with serial echo studies and clinical exams.  Her history dates back to approximately 2008 when she suffered an embolic stroke.  She subsequently was noted to have a "mass" on her mitral valve.  She was evaluated by Dr. Laneta Simmers and further imaging with cardiac MRI was performed.  After review with colleagues and extensive discussion with the patient, observation and medical therapy was recommended.  She was felt to likely have a benign calcified mass that could represent a calcified fibroblastoma or papilloma.  She has done well from a clinical perspective since that time.  However, over the past year she admits to progressive shortness of breath with activity.  She denies orthopnea, PND, or leg swelling.  She has not had any heart palpitations, exertional chest pain or pressure, lightheadedness, or syncope.  She is short of breath with walking a short distance on level ground and her symptoms have progressed over the last several months.  She has not had resting symptoms.  The patient has undergone a surface echocardiogram demonstrating normal LV systolic function, severe aortic stenosis with a mean transvalvular gradient of 47 mmHg, mild aortic insufficiency, and mild mitral regurgitation.  The mitral valve leaflets are calcified but a mass is not visualized on this recent follow-up echo.  The patient also underwent cardiac catheterization demonstrating widely patent coronary arteries without obstructive disease.  The aortic valve is calcified  and there is also mitral annular calcification noted.  By cardiac catheterization, peak and mean transaortic valve gradients are 32 and 22 mmHg, respectively.  Past Medical History:  Diagnosis Date  . Anemia   . Aortic stenosis    moderate AS 10/2012 echo (Dr. Anne Fu)  . Cardiomyopathy in other disease   . Cataracts, bilateral   . Diabetes mellitus   . Elevated cholesterol   . Generalized osteoarthritis   . GERD (gastroesophageal reflux disease)   . Heart murmur   . Hypertension   . Mitral valve disorder   . Phlebitis   . Stroke (HCC)   . Thrombophlebitis     Past Surgical History:  Procedure Laterality Date  . ABDOMINAL HYSTERECTOMY    . EYE SURGERY     cataract removal  . RIGHT/LEFT HEART CATH AND CORONARY ANGIOGRAPHY N/A 03/06/2018   Procedure: RIGHT/LEFT HEART CATH AND CORONARY ANGIOGRAPHY;  Surgeon: Lyn Records, MD;  Location: [facility];  Service: Cardiovascular;  Laterality: N/A;  . TOTAL KNEE ARTHROPLASTY Right 04/14/2013   Dr August Saucer  . TOTAL KNEE ARTHROPLASTY Right 04/14/2013   Procedure: TOTAL KNEE ARTHROPLASTY;  Surgeon: Cammy Copa, MD;  Location: [facility] OR;  Service: Orthopedics;  Laterality: Right;    Current Outpatient Medications  Medication Sig Dispense Refill  . amLODipine (NORVASC) 5 MG tablet Take 5 mg by mouth daily.    . cholecalciferol (VITAMIN D) 1000 UNITS tablet Take 1,000 Units by mouth daily.    . hydrochlorothiazide (HYDRODIURIL) 25 MG tablet Take 25 mg by mouth daily.    Marland Kitchen  metFORMIN (GLUCOPHAGE-XR) 750 MG 24 hr tablet TAKE 2 TABLETS BY MOUTH ONCE DAILY WITH BREAKFAST 60 tablet 3  . metoprolol succinate (TOPROL-XL) 50 MG 24 hr tablet Take 50 mg by mouth daily.     Bertram Gala Glycol-Propyl Glycol (SYSTANE OP) Apply 1 drop to eye 2 (two) times daily.    . potassium chloride (KLOR-CON 10) 10 MEQ tablet Take 1 tablet (10 mEq total) by mouth daily. 30 tablet 0  . rivaroxaban (XARELTO) 20 MG TABS tablet Take 1 tablet (20 mg total) daily with  supper by mouth. 30 tablet 11  . simvastatin (ZOCOR) 20 MG tablet Take 20 mg by mouth every evening.     No current facility-administered medications for this visit.     Allergies:   Ace inhibitors; Keflex [cephalexin]; and Rifadin [rifampin]   Social History:  The patient  reports that she has never smoked. She has never used smokeless tobacco. She reports that she does not drink alcohol or use drugs.   Family History:  The patient's  family history includes Hypertension in her father and mother; Stroke in her mother.    ROS:  Please see the history of present illness.  Otherwise, review of systems is positive for knee pain.  All other systems are reviewed and negative.    PHYSICAL EXAM: VS:  BP 136/68   Pulse 71   Ht 5' 3.5" (1.613 m)   Wt 192 lb 9.6 oz (87.4 kg)   SpO2 96%   BMI 33.58 kg/m  , BMI Body mass index is 33.58 kg/m. GEN: Well nourished, well developed, in no acute distress  HEENT: normal  Neck: no JVD, no masses.  Bilateral carotid bruits Cardiac: RRR with 4/6 harsh late peaking systolic murmur at the right upper sternal border, no diastolic murmur              Respiratory:  clear to auscultation bilaterally, normal work of breathing GI: soft, nontender, nondistended, + BS MS: no deformity or atrophy  Ext: Trace bilateral pretibial edema, pedal pulses 2+= bilaterally Skin: warm and dry, no rash Neuro:  Strength and sensation are intact Psych: euthymic mood, full affect  EKG:  EKG is not ordered today.  Recent Labs: 03/04/2018: BUN 8; Creatinine, Ser 0.77; Hemoglobin 9.0; Platelets 502; Potassium 3.9; Sodium 141   Lipid Panel     Component Value Date/Time   CHOL 121 04/23/2017 1022   TRIG 84.0 04/23/2017 1022   HDL 38.10 (L) 04/23/2017 1022   CHOLHDL 3 04/23/2017 1022   VLDL 16.8 04/23/2017 1022   LDLCALC 66 04/23/2017 1022      Wt Readings from Last 3 Encounters:  03/18/18 192 lb 9.6 oz (87.4 kg)  03/06/18 195 lb (88.5 kg)  03/04/18 194 lb (88 kg)      Cardiac Studies Reviewed: Echo 02-26-2018: Left ventricle:  The cavity size was normal. Wall thickness was increased in a pattern of moderate LVH. Systolic function was normal. The estimated ejection fraction was in the range of 55% to 60%. Wall motion was normal; there were no regional wall motion abnormalities. Doppler parameters are consistent with abnormal left ventricular relaxation (grade 1 diastolic dysfunction).  ------------------------------------------------------------------- Aortic valve:   Trileaflet; severely calcified leaflets.  Doppler:  There was severe stenosis.   There was mild regurgitation.    VTI ratio of LVOT to aortic valve: 0.36. Valve area (VTI): 1.09 cm^2. Indexed valve area (VTI): 0.54 cm^2/m^2. Peak velocity ratio of LVOT to aortic valve: 0.34. Valve area (Vmax): 1.03  cm^2. Indexed valve area (Vmax): 0.51 cm^2/m^2. Mean velocity ratio of LVOT to aortic valve: 0.36. Valve area (Vmean): 1.08 cm^2. Indexed valve area (Vmean): 0.54 cm^2/m^2.    Mean gradient (S): 47 mm Hg. Peak gradient (S): 71 mm Hg.  ------------------------------------------------------------------- Aorta:  Aortic root: The aortic root was normal in size. Ascending aorta: The ascending aorta was normal in size.  ------------------------------------------------------------------- Mitral valve:   Moderately calcified leaflets .  Doppler:   There was no evidence for stenosis.   There was mild regurgitation. Peak gradient (D): 6 mm Hg.  ------------------------------------------------------------------- Left atrium:  The atrium was mildly dilated.  ------------------------------------------------------------------- Right ventricle:  The cavity size was normal. Systolic function was normal.  ------------------------------------------------------------------- Pulmonic valve:    Structurally normal valve.   Cusp separation was normal.  Doppler:  Transvalvular velocity was within  the normal range. There was trivial regurgitation.  ------------------------------------------------------------------- Tricuspid valve:   Doppler:  There was mild regurgitation.  ------------------------------------------------------------------- Right atrium:  The atrium was normal in size.  ------------------------------------------------------------------- Pericardium:  There was no pericardial effusion.  ------------------------------------------------------------------- Systemic veins:  IVC measured 2.2 cm with > 50% respirophasic variation, suggesting RA pressure 8 mmHg.  ------------------------------------------------------------------- Measurements   Left ventricle                           Value           Reference  LV ID, ED, PLAX chordal          (L)     34.6   mm       43 - 52  LV ID, ES, PLAX chordal                  23     mm       23 - 38  LV fx shortening, PLAX chordal           34     %        >=29  LV PW thickness, ED                      12.3   mm       ---------  IVS/LV PW ratio, ED              (H)     1.41            <=1.3  Stroke volume, 2D                        121    ml       ---------  Stroke volume/bsa, 2D                    60     ml/m^2   ---------  LV e&', lateral                           7.35   cm/s     ---------  LV E/e&', lateral                         16.6            ---------  LV e&', medial  5.44   cm/s     ---------  LV E/e&', medial                          22.43           ---------  LV e&', average                           6.4    cm/s     ---------  LV E/e&', average                         19.08           ---------  LV ejection time                         340    ms       ---------    Ventricular septum                       Value           Reference  IVS thickness, ED                        17.4   mm       ---------    LVOT                                     Value           Reference  LVOT ID, S                                19.6   mm       ---------  LVOT area                                3.02   cm^2     ---------  LVOT ID                                  20     mm       ---------  LVOT peak velocity, S                    142    cm/s     ---------  LVOT mean velocity, S                    113    cm/s     ---------  LVOT VTI, S                              38.4   cm       ---------  LVOT peak gradient, S                    8      mm Hg    ---------  Stroke volume (SV), LVOT DP  115.9  ml       ---------  Stroke index (SV/bsa), LVOT DP           57.7   ml/m^2   ---------    Aortic valve                             Value           Reference  Aortic valve peak velocity, S            421.26 cm/s     ---------  Aortic valve mean velocity, S            314.41 cm/s     ---------  Aortic valve VTI, S                      106.28 cm       ---------  Aortic mean gradient, S                  44     mm Hg    ---------  Aortic peak gradient, S                  71     mm Hg    ---------  VTI ratio, LVOT/AV                       0.36            ---------  Aortic valve area, VTI                   1.09   cm^2     ---------  Aortic valve area/bsa, VTI               0.54   cm^2/m^2 ---------  Velocity ratio, peak, LVOT/AV            0.34            ---------  Aortic valve area, peak velocity         1.03   cm^2     ---------  Aortic valve area/bsa, peak              0.51   cm^2/m^2 ---------  velocity  Velocity ratio, mean, LVOT/AV            0.36            ---------  Aortic valve area, mean velocity         1.08   cm^2     ---------  Aortic valve area/bsa, mean              0.54   cm^2/m^2 ---------  velocity  Aortic regurg peak velocity              158.91 cm/s     ---------  Aortic regurg pressure half-time         339    ms       ---------  Aortic regurg peak gradient              10     mm Hg    ---------    Aorta                                    Value  Reference   Aortic root ID, ED                       29     mm       ---------  Ascending aorta ID, A-P, S               35     mm       ---------    Left atrium                              Value           Reference  LA ID, A-P, ES                           44     mm       ---------  LA ID/bsa, A-P                           2.19   cm/m^2   <=2.2  LA volume, S                             68     ml       ---------  LA volume/bsa, S                         33.9   ml/m^2   ---------  LA volume, ES, 1-p A4C                   51     ml       ---------  LA volume/bsa, ES, 1-p A4C               25.4   ml/m^2   ---------  LA volume, ES, 1-p A2C                   79     ml       ---------  LA volume/bsa, ES, 1-p A2C               39.3   ml/m^2   ---------    Mitral valve                             Value           Reference  Mitral E-wave peak velocity              122    cm/s     ---------  Mitral A-wave peak velocity              149    cm/s     ---------  Mitral deceleration time         (H)     356    ms       150 - 230  Mitral peak gradient, D                  6      mm Hg    ---------  Mitral E/A ratio, peak                   0.8             ---------  Pulmonary arteries                       Value           Reference  PA pressure, S, DP               (H)     53     mm Hg    <=30    Tricuspid valve                          Value           Reference  Tricuspid regurg peak velocity           335    cm/s     ---------  Tricuspid peak RV-RA gradient            45     mm Hg    ---------    Right ventricle                          Value           Reference  RV s&', lateral, S                        7.35   cm/s     ---------  Legend: (L)  and  (H)  mark values outside specified reference range.  ------------------------------------------------------------------- Prepared and Electronically Authenticated by  Marca Ancona, M.D. 2019-05-22T14:57:03  Cardiac Cath 03/06/2018: Conclusion     Calcific aortic stenosis with peak to peak gradient of 32 mmHg.  Valve is heavily calcified by cine fluoroscopy.  Calculated aortic valve area 1.62 cm related to a calculated/estimated abnormally high cardiac output of 7.8 L/min  Technically difficult coronary angiography from the right radial due to heavy calcification in the iliac/subclavian preventing accurate catheter control.  Normal left main  Large widely patent LAD without obstructive disease  Large widely patent circumflex without obstructive disease  RCA never selectively engaged but visualized in the proximal mid and distal vessel is widely patent.  No evidence of pulmonary hypertension   RECOMMENDATIONS:   There is discordance between echo and hemodynamic data with reference to severity of aortic valve disease.  There is no other explanation for the patient's progressive dyspnea.  I would recommend referring the patient to the aortic valve clinic to determine if proceeding with valve therapy is reasonable.    Indications   Aortic stenosis, severe [I35.0 (ICD-10-CM)]  DOE (dyspnea on exertion) [R06.09 (ICD-10-CM)]  Chronic diastolic HF (heart failure), NYHA class 3 (HCC) [I50.32 (ICD-10-CM)]  Procedural Details/Technique   Technical Details The right radial area was sterilely prepped and draped. Intravenous sedation with Versed and fentanyl was administered. 1% Xylocaine was infiltrated to achieve local analgesia. Using real-time vascular ultrasound, a double wall stick with an angiocath was utilized to obtain intra-arterial access. A VUS image was saved for the permanent record.The modified Seldinger technique was used to place a 33F " Slender" sheath in the right radial artery. Weight based heparin was administered. Coronary angiography was done using 5 F catheters. Right coronary angiography was performed with a JR4. Left ventricular hemodymic recordings and angiography was done using the JR 4 catheter and hand  injection. Left coronary angiography was performed with a JL 3.5 cm.  Right heart catheterization was performed by exchanging a previously placed antecubital IV angio-cath for  a 5 [place] Slender sheath. 1% Xylocaine was used to locally nesthetize the area around the IV site. The IV catheter was wired using an .018 guidewire. The modified Seldinger technique was used to place the 5 [place] sheath. Double glove technique was used to enhance sterility. After sheath insertion, right heart cath was performed using a 5 French balloon tipped catheter and fluoroscopic guidance. Pressures were recorded in each chamber and in the pulmonary capillary wedge position.. The main pulmonary artery O2 saturation was sampled.   Hemostasis was achieved using a pneumatic band.  During this procedure the patient is administered a total of Versed 1 mg and Fentanyl 25 mg to achieve and maintain moderate conscious sedation. The patient's heart rate, blood pressure, and oxygen saturation are monitored continuously during the procedure. The period of conscious sedation is 52 minutes, of which I was present face-to-face 100% of this time.   Estimated blood loss <50 mL.  During this procedure the patient was administered the following to achieve and maintain moderate conscious sedation: Versed 1 mg, Fentanyl 25 mcg, while the patient's heart rate, blood pressure, and oxygen saturation were continuously monitored. The period of conscious sedation was 52 minutes, of which I was present face-to-face 100% of this time.  Coronary Findings   Diagnostic  Dominance: Right  No diagnostic findings have been documented.  Intervention   No interventions have been documented.  Right Heart   Right Heart Pressures Hemodynamic findings consistent with pulmonary hypertension. LV EDP is normal.  Left Heart   Left Ventricle LV end diastolic pressure is normal.  Coronary Diagrams   Diagnostic Diagram       Implants    No implant  documentation for this case.  MERGE Images   Show images for CARDIAC CATHETERIZATION   Link to Procedure Log   Procedure Log    Hemo Data    Most Recent Value  Fick Cardiac Output 7.81 L/min  Fick Cardiac Output Index 4.03 (L/min)/BSA  Aortic Mean Gradient 22.1 mmHg  Aortic Peak Gradient 32 mmHg  Aortic Valve Area 1.62  Aortic Value Area Index 0.83 cm2/BSA  RA A Wave 7 mmHg  RA V Wave 1 mmHg  RA Mean 1 mmHg  RV Systolic Pressure 36 mmHg  RV Diastolic Pressure 3 mmHg  RV EDP 7 mmHg  PA Systolic Pressure 31 mmHg  PA Diastolic Pressure 16 mmHg  PA Mean 23 mmHg  PW A Wave 11 mmHg  PW V Wave 8 mmHg  PW Mean 9 mmHg  AO Systolic Pressure 139 mmHg  AO Diastolic Pressure 65 mmHg  AO Mean 95 mmHg  LV Systolic Pressure 175 mmHg  LV Diastolic Pressure 7 mmHg  LV EDP 12 mmHg  Arterial Occlusion Pressure Extended Systolic Pressure 140 mmHg  Arterial Occlusion Pressure Extended Diastolic Pressure 65 mmHg  Arterial Occlusion Pressure Extended Mean Pressure 96 mmHg  Left Ventricular Apex Extended Systolic Pressure 172 mmHg  Left Ventricular Apex Extended Diastolic Pressure 6 mmHg  Left Ventricular Apex Extended EDP Pressure 10 mmHg  QP/QS 1  TPVR Index 5.72 HRUI  TSVR Index 23.61 HRUI  PVR SVR Ratio 0.15  TPVR/TSVR Ratio 0.24   STS Risk Calculator: Risk of Mortality: 3.003% Renal Failure: 4.070% Permanent Stroke: 2.209% Prolonged Ventilation: 10.818% DSW Infection: 0.207% Reoperation: 3.626% Morbidity or Mortality: 15.740% Short Length of Stay: 14.421% Long Length of Stay:  ASSESSMENT AND PLAN: 77 year old woman with severe, stage D1 aortic stenosis, with progressive now New York Heart Association functional class III symptoms of  chronic diastolic heart failure. Her exam, echo findings, and progressive symptoms of shortness of breath are all suggestive of severe aortic stenosis. I have reviewed the natural history of aortic stenosis with the patient today. We have  discussed the limitations of medical therapy and the poor prognosis associated with symptomatic aortic stenosis. We have reviewed potential treatment options, including palliative medical therapy, conventional surgical aortic valve replacement, and transcatheter aortic valve replacement. We discussed treatment options in the context of the patient's specific comorbid medical conditions.   The patient also has a history of heavy mitral annular and valvular calcification with a calcified mass on the mitral valve. This was managed conservatively and she has not had any recurrent stroke or other apparent complication. I have recommended an updated TEE to evaluate her mitral valve anatomy, a CTA of the heart and chest, abdomen, pelvis, for anatomic decision-making regarding her aortic stenosis treatment options, and a cardiac surgical evaluation with Dr Laneta Simmers who has seen her previously. She understands that a multidisciplinary team will discuss her treatment options and likely move forward with either surgical AVR or TAVR in the near future.   Current medicines are reviewed with the patient today.  The patient does not have concerns regarding medicines.  Labs/ tests ordered today include:   Orders Placed This Encounter  Procedures  . CT CORONARY MORPH W/CTA COR W/SCORE W/CA W/CM &/OR WO/CM  . CT ANGIO ABDOMEN PELVIS  W &/OR WO CONTRAST  . CT ANGIO CHEST AORTA W &/OR WO CONTRAST    Disposition:   As outlined above  Signed, Tonny Bollman, MD  03/19/2018 6:37 AM    [facility] [address] [place], [place], [place]  [address] Phone: [phone]; Fax: [phone]

## 2018-03-19 ENCOUNTER — Telehealth: Payer: Self-pay

## 2018-03-19 NOTE — Telephone Encounter (Signed)
Scheduled patient for TAVR scans 7/3. She understands to arrive by 1000. Reviewed instructions with patient. She was grateful for call and agrees with treatment plan.

## 2018-04-01 ENCOUNTER — Encounter (HOSPITAL_COMMUNITY)
Admission: RE | Disposition: A | Discharge status: Home / Self Care | Payer: Self-pay | Source: Ambulatory Visit | Attending: Cardiology

## 2018-04-01 ENCOUNTER — Ambulatory Visit (HOSPITAL_COMMUNITY)
Admission: RE | Admit: 2018-04-01 | Discharge: 2018-04-01 | Disposition: A | Discharge status: Home / Self Care | Payer: Medicare Other | Source: Ambulatory Visit | Attending: Cardiology | Admitting: Cardiology

## 2018-04-01 ENCOUNTER — Ambulatory Visit (HOSPITAL_BASED_OUTPATIENT_CLINIC_OR_DEPARTMENT_OTHER)
Admission: RE | Admit: 2018-04-01 | Discharge: 2018-04-01 | Disposition: A | Discharge status: Home / Self Care | Payer: Medicare Other | Source: Ambulatory Visit | Attending: Cardiology | Admitting: Cardiology

## 2018-04-01 ENCOUNTER — Other Ambulatory Visit: Payer: Self-pay

## 2018-04-01 ENCOUNTER — Encounter (HOSPITAL_COMMUNITY): Payer: Self-pay

## 2018-04-01 DIAGNOSIS — I350 Nonrheumatic aortic (valve) stenosis: Secondary | ICD-10-CM | POA: Insufficient documentation

## 2018-04-01 DIAGNOSIS — I5032 Chronic diastolic (congestive) heart failure: Secondary | ICD-10-CM | POA: Insufficient documentation

## 2018-04-01 DIAGNOSIS — I110 Hypertensive heart disease with heart failure: Secondary | ICD-10-CM | POA: Diagnosis not present

## 2018-04-01 DIAGNOSIS — Z7984 Long term (current) use of oral hypoglycemic drugs: Secondary | ICD-10-CM | POA: Insufficient documentation

## 2018-04-01 DIAGNOSIS — Z8249 Family history of ischemic heart disease and other diseases of the circulatory system: Secondary | ICD-10-CM | POA: Insufficient documentation

## 2018-04-01 DIAGNOSIS — E7800 Pure hypercholesterolemia, unspecified: Secondary | ICD-10-CM | POA: Insufficient documentation

## 2018-04-01 DIAGNOSIS — E119 Type 2 diabetes mellitus without complications: Secondary | ICD-10-CM | POA: Diagnosis not present

## 2018-04-01 DIAGNOSIS — I429 Cardiomyopathy, unspecified: Secondary | ICD-10-CM | POA: Insufficient documentation

## 2018-04-01 DIAGNOSIS — I351 Nonrheumatic aortic (valve) insufficiency: Secondary | ICD-10-CM

## 2018-04-01 DIAGNOSIS — M159 Polyosteoarthritis, unspecified: Secondary | ICD-10-CM | POA: Diagnosis not present

## 2018-04-01 DIAGNOSIS — Z8673 Personal history of transient ischemic attack (TIA), and cerebral infarction without residual deficits: Secondary | ICD-10-CM | POA: Insufficient documentation

## 2018-04-01 DIAGNOSIS — K219 Gastro-esophageal reflux disease without esophagitis: Secondary | ICD-10-CM | POA: Diagnosis not present

## 2018-04-01 DIAGNOSIS — R0602 Shortness of breath: Secondary | ICD-10-CM

## 2018-04-01 DIAGNOSIS — Z7901 Long term (current) use of anticoagulants: Secondary | ICD-10-CM | POA: Diagnosis not present

## 2018-04-01 HISTORY — PX: TEE WITHOUT CARDIOVERSION: SHX5443

## 2018-04-01 LAB — GLUCOSE, CAPILLARY: GLUCOSE-CAPILLARY: 136 mg/dL — AB (ref 70–99)

## 2018-04-01 SURGERY — ECHOCARDIOGRAM, TRANSESOPHAGEAL
Anesthesia: Moderate Sedation

## 2018-04-01 MED ORDER — MIDAZOLAM HCL 5 MG/ML IJ SOLN
INTRAMUSCULAR | Status: AC
  Filled 2018-04-01: qty 2

## 2018-04-01 MED ORDER — BUTAMBEN-TETRACAINE-BENZOCAINE 2-2-14 % EX AERO
INHALATION_SPRAY | CUTANEOUS | Status: DC | PRN
  Administered 2018-04-01: 2 via TOPICAL

## 2018-04-01 MED ORDER — MIDAZOLAM HCL 10 MG/2ML IJ SOLN
INTRAMUSCULAR | Status: DC | PRN
  Administered 2018-04-01: 2 mg via INTRAVENOUS

## 2018-04-01 MED ORDER — SODIUM CHLORIDE 0.9 % IV SOLN
750.0000 mL | Freq: Once | INTRAVENOUS | Status: AC
  Administered 2018-04-01: 500 mL via INTRAVENOUS

## 2018-04-01 MED ORDER — FENTANYL CITRATE (PF) 100 MCG/2ML IJ SOLN
INTRAMUSCULAR | Status: AC
  Filled 2018-04-01: qty 2

## 2018-04-01 MED ORDER — FENTANYL CITRATE (PF) 100 MCG/2ML IJ SOLN
INTRAMUSCULAR | Status: DC | PRN
  Administered 2018-04-01: 25 ug via INTRAVENOUS

## 2018-04-01 NOTE — CV Procedure (Signed)
   Transesophageal Echocardiogram  Indications:aortic stenosis, evaluate mitral valve possible mass  Time out performed  During this procedure the patient is administered a total of Versed 2 mg and Fentanyl 50 mcg to achieve and maintain moderate conscious sedation.  The patient's heart rate, blood pressure, and oxygen saturation are monitored continuously during the procedure. The period of conscious sedation is 25 minutes, of which I was present face-to-face 100% of this time.  Findings:  Left Ventricle: Normal EF 65%   Mitral Valve: No mass is seen.  Mild mitral regurgitation.  Aortic Valve: Severely calcified, severely restricted, severe aortic stenosis  Tricuspid Valve: Mild to moderate tricuspid regurgitation  Left Atrium: No left atrial appendage thrombus  Bubble Contrast Study: Not performed  Donato Schultz, MD

## 2018-04-01 NOTE — Interval H&P Note (Signed)
History and Physical Interval Note:  04/01/2018 8:58 AM  Janet Williamson  has presented today for surgery, with the diagnosis of AORTIC STENOSIS,MITRAL VALVE MASS  The various methods of treatment have been discussed with the patient and family. After consideration of risks, benefits and other options for treatment, the patient has consented to  Procedure(s): TRANSESOPHAGEAL ECHOCARDIOGRAM (TEE) (N/A) as a surgical intervention .  The patient's history has been reviewed, patient examined, no change in status, stable for surgery.  I have reviewed the patient's chart and labs.  Questions were answered to the patient's satisfaction.     [employer]

## 2018-04-01 NOTE — Progress Notes (Signed)
  Echocardiogram Echocardiogram Transesophageal has been performed.  Roosvelt Maser F 04/01/2018, 10:23 AM

## 2018-04-01 NOTE — Discharge Instructions (Signed)

## 2018-04-02 ENCOUNTER — Encounter (HOSPITAL_COMMUNITY): Payer: Self-pay | Admitting: Cardiology

## 2018-04-07 ENCOUNTER — Encounter: Payer: Self-pay | Admitting: Physician Assistant

## 2018-04-08 ENCOUNTER — Other Ambulatory Visit: Payer: Self-pay

## 2018-04-08 ENCOUNTER — Ambulatory Visit (HOSPITAL_COMMUNITY)
Admission: RE | Admit: 2018-04-08 | Discharge: 2018-04-08 | Disposition: A | Discharge status: Home / Self Care | Payer: Medicare Other | Source: Ambulatory Visit | Attending: Cardiovascular Disease | Admitting: Cardiovascular Disease

## 2018-04-08 DIAGNOSIS — I6523 Occlusion and stenosis of bilateral carotid arteries: Secondary | ICD-10-CM | POA: Insufficient documentation

## 2018-04-08 DIAGNOSIS — R0602 Shortness of breath: Secondary | ICD-10-CM

## 2018-04-08 DIAGNOSIS — I350 Nonrheumatic aortic (valve) stenosis: Secondary | ICD-10-CM

## 2018-04-08 LAB — PULMONARY FUNCTION TEST
DL/VA % pred: 102 %
DL/VA: 4.79 ml/min/mmHg/L
DLCO UNC % PRED: 69 %
DLCO UNC: 15.84 ml/min/mmHg
FEF 25-75 PRE: 2.15 L/s
FEF 25-75 Post: 2.15 L/sec
FEF2575-%CHANGE-POST: 0 %
FEF2575-%PRED-POST: 160 %
FEF2575-%Pred-Pre: 160 %
FEV1-%CHANGE-POST: 0 %
FEV1-%PRED-POST: 108 %
FEV1-%Pred-Pre: 107 %
FEV1-POST: 1.67 L
FEV1-Pre: 1.67 L
FEV1FVC-%Change-Post: 3 %
FEV1FVC-%PRED-PRE: 110 %
FEV6-%CHANGE-POST: -3 %
FEV6-%PRED-POST: 100 %
FEV6-%Pred-Pre: 103 %
FEV6-Post: 1.92 L
FEV6-Pre: 1.99 L
FEV6FVC-%PRED-POST: 104 %
FEV6FVC-%PRED-PRE: 104 %
FVC-%Change-Post: -3 %
FVC-%Pred-Post: 95 %
FVC-%Pred-Pre: 99 %
FVC-Post: 1.92 L
FVC-Pre: 1.99 L
POST FEV1/FVC RATIO: 87 %
PRE FEV6/FVC RATIO: 100 %
Post FEV6/FVC ratio: 100 %
Pre FEV1/FVC ratio: 84 %
RV % PRED: 81 %
RV: 1.87 L
TLC % pred: 83 %
TLC: 4.1 L

## 2018-04-08 MED ORDER — ALBUTEROL SULFATE (2.5 MG/3ML) 0.083% IN NEBU
2.5000 mg | INHALATION_SOLUTION | Freq: Once | RESPIRATORY_TRACT | Status: AC
  Administered 2018-04-08: 2.5 mg via RESPIRATORY_TRACT

## 2018-04-08 NOTE — Progress Notes (Signed)
Bilateral carotid duplex completed. 1% to 39%. Vertebral artery flow is antegrade. [place] Patrina Andreas,RVS 04/08/2018 12:22 PM

## 2018-04-09 ENCOUNTER — Ambulatory Visit: Payer: Medicare Other | Attending: Cardiovascular Disease | Admitting: Physical Therapy

## 2018-04-09 ENCOUNTER — Ambulatory Visit (HOSPITAL_COMMUNITY): Payer: Medicare Other

## 2018-04-09 ENCOUNTER — Institutional Professional Consult (permissible substitution) (INDEPENDENT_AMBULATORY_CARE_PROVIDER_SITE_OTHER): Payer: Medicare Other | Admitting: Surgery

## 2018-04-09 ENCOUNTER — Encounter: Payer: Self-pay | Admitting: Surgery

## 2018-04-09 ENCOUNTER — Ambulatory Visit (HOSPITAL_COMMUNITY)
Admission: RE | Admit: 2018-04-09 | Discharge: 2018-04-09 | Disposition: A | Discharge status: Home / Self Care | Payer: Medicare Other | Source: Ambulatory Visit | Attending: Cardiovascular Disease | Admitting: Cardiovascular Disease

## 2018-04-09 ENCOUNTER — Encounter: Payer: Self-pay | Admitting: Physical Therapy

## 2018-04-09 ENCOUNTER — Other Ambulatory Visit: Payer: Self-pay

## 2018-04-09 VITALS — BP 159/76 | HR 76 | Resp 18 | Ht 63.5 in | Wt 190.4 lb

## 2018-04-09 DIAGNOSIS — I350 Nonrheumatic aortic (valve) stenosis: Secondary | ICD-10-CM

## 2018-04-09 DIAGNOSIS — I700 Atherosclerosis of aorta: Secondary | ICD-10-CM | POA: Diagnosis not present

## 2018-04-09 DIAGNOSIS — I281 Aneurysm of pulmonary artery: Secondary | ICD-10-CM | POA: Diagnosis not present

## 2018-04-09 DIAGNOSIS — R2689 Other abnormalities of gait and mobility: Secondary | ICD-10-CM

## 2018-04-09 DIAGNOSIS — I517 Cardiomegaly: Secondary | ICD-10-CM | POA: Insufficient documentation

## 2018-04-09 MED ORDER — IOPAMIDOL (ISOVUE-370) INJECTION 76%
INTRAVENOUS | Status: AC
  Administered 2018-04-09: 95 mL
  Filled 2018-04-09: qty 100

## 2018-04-09 NOTE — Pre-Procedure Instructions (Signed)
Donnesha CHRYSTIE HAGWOOD  04/09/2018    Your procedure is scheduled on Tuesday, April 15, 2018 at 10:15 AM.   Report to [facility] Entrance "A" Admitting Office at 8:15 AM.   Call this number if you have problems the morning of surgery: [phone]   Questions prior to day of surgery, please call [phone] between 8 & 4 PM.   Remember:  Do not eat or drink after midnight Monday, 04/14/18.  Do not take any of your medications the morning of surgery.  Hold Metformin and Xarelto as instructed by Dr. Laneta Simmers or Dr. Excell Seltzer.    How to Manage Your Diabetes Before Surgery   Why is it important to control my blood sugar before and after surgery?   Improving blood sugar levels before and after surgery helps healing and can limit problems.  A way of improving blood sugar control is eating a healthy diet by:  - Eating less sugar and carbohydrates  - Increasing activity/exercise  - Talk with your doctor about reaching your blood sugar goals  High blood sugars (greater than 180 mg/dL) can raise your risk of infections and slow down your recovery so you will need to focus on controlling your diabetes during the weeks before surgery.  Make sure that the doctor who takes care of your diabetes knows about your planned surgery including the date and location.  How do I manage my blood sugars before surgery?   Check your blood sugar at least 4 times a day, 2 days before surgery to make sure that they are not too high or low.  Check your blood sugar the morning of your surgery when you wake up and every 2 hours until you get to the Short-Stay unit.  Treat a low blood sugar (less than 70 mg/dL) with 1/2 cup of clear juice (cranberry or apple), 4 glucose tablets, OR glucose gel.  Recheck blood sugar in 15 minutes after treatment (to make sure it is greater than 70 mg/dL).  If blood sugar is not greater than 70 mg/dL on re-check, call [phone] for further instructions.   Report your blood  sugar to the Short-Stay nurse when you get to Short-Stay.  References:  [facility], 2007 "How to Manage your Diabetes Before and After Surgery".    Do not wear jewelry, make-up or nail polish.  Do not wear lotions, powders, perfumes or deodorant.  Do not shave 48 hours prior to surgery.    Do not bring valuables to the hospital.  [facility] is not responsible for any belongings or valuables.  Contacts, dentures or bridgework may not be worn into surgery.  Leave your suitcase in the car.  After surgery it may be brought to your room.  For patients admitted to the hospital, discharge time will be determined by your treatment team.  [facility] - Preparing for Surgery  Before surgery, you can play an important role.  Because skin is not sterile, your skin needs to be as free of germs as possible.  You can reduce the number of germs on you skin by washing with CHG (chlorahexidine gluconate) soap before surgery.  CHG is an antiseptic cleaner which kills germs and bonds with the skin to continue killing germs even after washing.  Oral Hygiene is also important in reducing the risk of infection.  Remember to brush your teeth with your regular toothpaste the morning of surgery.  Please DO NOT use if you have an allergy to CHG or  antibacterial soaps.  If your skin becomes reddened/irritated stop using the CHG and inform your nurse when you arrive at Short Stay.  Do not shave (including legs and underarms) for at least 48 hours prior to the first CHG shower.  You may shave your face.  Please follow these instructions carefully:   1.  Shower with CHG Soap the night before surgery and the morning of Surgery.  2.  If you choose to wash your hair, wash your hair first as usual with your normal shampoo.  3.  After you shampoo, rinse your hair and body thoroughly to remove the shampoo. 4.  Use CHG as you would any other liquid soap.  You can apply chg directly to the skin and  wash gently with a      scrungie or washcloth.           5.  Apply the CHG Soap to your body ONLY FROM THE NECK DOWN.   Do not use on open wounds or open sores. Avoid contact with your eyes, ears, mouth and genitals (private parts).  Wash genitals (private parts) with your normal soap.  6.  Wash thoroughly, paying special attention to the area where your surgery will be performed.  7.  Thoroughly rinse your body with warm water from the neck down.  8.  DO NOT shower/wash with your normal soap after using and rinsing off the CHG Soap.  9.  Pat yourself dry with a clean towel.            10.  Wear clean pajamas.            11.  Place clean sheets on your bed the night of your first shower and do not sleep with pets.  Day of Surgery  Shower as above. Do not apply any lotions/deodorants the morning of surgery.   Please wear clean clothes to the hospital. Remember to brush your teeth with toothpaste.   Please read over the fact sheets that you were given.

## 2018-04-09 NOTE — Therapy (Signed)
[facility] [address] [place], [place], [address] Phone: [phone]   Fax:  [phone]  Physical Therapy Evaluation  Patient Details  Name: Janet Williamson MRN: [id number] Date of Birth: [birth year] Referring Provider: Tonny Bollman MD   Encounter Date: 04/09/2018  PT End of Session - 04/09/18 1521    Visit Number  1    Number of Visits  1    Date for PT Re-Evaluation  04/09/18    PT Start Time  1445    PT Stop Time  1514    PT Time Calculation (min)  29 min    Equipment Utilized During Treatment  Gait belt    Activity Tolerance  Patient tolerated treatment well    Behavior During Therapy  [facility] for tasks assessed/performed       Past Medical History:  Diagnosis Date  . Anemia   . Cardiac mass    a. 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 Cardiomyopathy in other disease   . Cataracts, bilateral   . Diabetes mellitus   . Elevated cholesterol   . Generalized osteoarthritis   . GERD (gastroesophageal reflux disease)   . Hypertension   . PAF (paroxysmal atrial fibrillation) (HCC)    a. s/p DCCV 06/2017, on Xarelto  . Phlebitis   . Severe aortic stenosis   . Stroke (HCC)   . Thrombophlebitis     Past Surgical History:  Procedure Laterality Date  . ABDOMINAL HYSTERECTOMY    . EYE SURGERY     cataract removal  . RIGHT/LEFT HEART CATH AND CORONARY ANGIOGRAPHY N/A 03/06/2018   Procedure: RIGHT/LEFT HEART CATH AND CORONARY ANGIOGRAPHY;  Surgeon: Lyn Records, MD;  Location: [facility] INVASIVE CV LAB;  Service: Cardiovascular;  Laterality: N/A;  . TEE WITHOUT CARDIOVERSION N/A 04/01/2018   Procedure: TRANSESOPHAGEAL ECHOCARDIOGRAM (TEE);  Surgeon: Jake Bathe, MD;  Location: [facility] ENDOSCOPY;  Service: Cardiovascular;  Laterality: N/A;  . TOTAL KNEE ARTHROPLASTY Right 04/14/2013   Dr August Saucer  . TOTAL KNEE ARTHROPLASTY Right 04/14/2013   Procedure: TOTAL KNEE ARTHROPLASTY;  Surgeon: Cammy Copa, MD;  Location: [facility] OR;   Service: Orthopedics;  Laterality: Right;    There were no vitals filed for this visit.   Subjective Assessment - 04/09/18 1448    Subjective  pt is a 77 y.o F with CC of SOB that has been progressively been worsening over the course of the last 6 months and report she has a hx of aortic stenosis.  pt currently reports no joint pain or issues that limit her from walking.     How long can you sit comfortably?  unlimited    How long can you stand comfortably?  5-10 min    How long can you walk comfortably?  10  min    Patient Stated Goals  to get heart better    Currently in Pain?  No/denies         [facility] PT Assessment - 04/09/18 1450      Assessment   Medical Diagnosis  Severe Aortic Stenosis    Referring Provider  Tonny Bollman MD    Hand Dominance  Right      Precautions   Precautions  None      Restrictions   Weight Bearing Restrictions  No      Balance Screen   Has the patient fallen in the past 6 months  No    Has the patient had a decrease in activity level because  of a fear of falling?   No    Is the patient reluctant to leave their home because of a fear of falling?   No      Home Environment   Living Environment  Private residence    Living Arrangements  Children    Available Help at Discharge  Family    Type of Home  House    Home Access  Level entry    Home Layout  One level    Home Equipment  None      Prior Function   Level of Independence  Independent    Vocation  Retired      IT consultant   Overall Cognitive Status  Within Functional Limits for tasks assessed      ROM / Strength   AROM / PROM / Strength  AROM;Strength      AROM   Overall AROM Comments  functional mobility in bil UE/LE      Strength   Overall Strength Comments  functional strength overall 4/5    Strength Assessment Site  Hand    Right Hand Grip (lbs)  50    Left Hand Grip (lbs)  42      Ambulation/Gait   Gait Pattern  Step-through pattern;Decreased stride length;Lateral trunk  lean to left;Trendelenburg    Gait Comments  pt stopped and rested at 4:17 lasting 57 sec with HR at 119 and O2 93%       [facility] Pre-Surgical Assessment - 04/09/18 0001    5 Meter Walk Test- trial 1  5 sec    5 Meter Walk Test- trial 2  5 sec.     5 Meter Walk Test- trial 3  4 sec.    5 meter walk test average  4.67 sec    4 Stage Balance Test tolerated for:   10 sec.    4 Stage Balance Test Position  4    comment  mild difficulty getting into stage 3 but able to maintain position    ADL/IADL Independent with:  Bathing;Dressing;Meal prep;Finances    ADL/IADL Needs Assistance with:  Pincus Badder work    ADL/IADL Fraility Index  Midly frail    6 Minute Walk- Baseline  yes    BP (mmHg)  141/69    HR (bpm)  67    02 Sat (%RA)  96 %    Modified Borg Scale for Dyspnea  0.5- Very, very slight shortness of breath    Perceived Rate of Exertion (Borg)  6-    6 Minute Walk Post Test  yes    BP (mmHg)  149/72    HR (bpm)  119    02 Sat (%RA)  93 %    Modified Borg Scale for Dyspnea  1- Very mild shortness of breath    Perceived Rate of Exertion (Borg)  13- Somewhat hard    Aerobic Endurance Distance Walked  853    Endurance additional comments  pt demonstrated 44.79% disability compared to age related norms              Objective measurements completed on examination: See above findings.                           Plan - 04/09/18 1522    Clinical Impression Statement  See assessment in note    Clinical Presentation  Stable    Clinical Decision Making  Low    PT Frequency  One time  visit    PT Next Visit Plan  TAVR evaluation    Consulted and Agree with Plan of Care  Patient      Clinical Impression Statement: Pt is a 77 yo female presenting to OP PT for evaluation prior to possible TAVR surgery due to severe aortic stenosis. Pt reports onset of SOB approximately 6 months ago. Symptoms are limiting endurnace. Pt presents with functional ROM and strength, good  balance and is low  fall risk 4 stage balance test, good walking speed and limited aerobic endurance per 6 minute walk test. Pt ambulated 740 feet in 4:17 before requesting a seated rest beak lasting 57 seconds. At time of rest, patient's HR was 119 bpm and O2 was 93% on room air. Pt reported 1/10 shortness of breath on modified scale for dyspnea. Pt able to resume after rest and ambulate an additional 113 feet. Pt ambulated a total of 853 feet in 6 minute walk. SOB and fatigue increased significantly with 6 minute walk test. Based on the Short Physical Performance Battery, patient has a frailty rating of 8/12 with </= 5/12 considered frail.    Patient demonstrated the following deficits and impairments:     Visit Diagnosis: Other abnormalities of gait and mobility     Problem List Patient Active Problem List   Diagnosis Date Noted  . PAF (paroxysmal atrial fibrillation) (HCC) 03/04/2018  . Leg swelling 03/04/2018  . Mitral valve mass 11/03/2014  . History of stroke 11/03/2014  . Multinodular goiter 10/19/2013  . Hypertension   . Elevated cholesterol   . Cardiomyopathy in other disease   . Mitral valve disorder   . Heart murmur   . Angioedema 05/06/2013  . DM (diabetes mellitus) (HCC) 05/06/2013  . HTN (hypertension) 05/06/2013  . Anemia 05/06/2013  . Aortic stenosis 05/06/2013   Lulu Riding PT, DPT, LAT, ATC  04/09/18  3:28 PM      [facility] [address] [place], [place], [address] Phone: [phone]   Fax:  [phone]  Name: HONOKA BYARD MRN: [id number] Date of Birth: July 04, 1941

## 2018-04-10 ENCOUNTER — Encounter: Payer: Self-pay | Admitting: Surgery

## 2018-04-10 NOTE — Progress Notes (Signed)
Patient ID: Janet Williamson, female   DOB: July 09, 1941, 77 y.o.   MRN: [id number]   [facility]  MULTIDISCIPLINARY HEART VALVE CLINIC  CARDIOTHORACIC SURGERY CONSULTATION REPORT  Referring Provider is Jake Bathe, MD PCP is Maurice Small, MD  Reason for consultation: Severe aortic stenosis  HPI:  The patient is a 77 year old woman with history of diabetes, hyperlipidemia, hypertension, paroxysmal atrial fibrillation on Xarelto status post cardioversion in September 2018, prior embolic stroke in 2008, and aortic stenosis who was referred for consideration of transcatheter aortic valve replacement.  I had seen her in 2008 when she presented with an embolic stroke and was found to have a mass on her mitral valve.  It was unclear if this was a fibroblastoma or papilloma or possibly dystrophic calcification and we decided to follow it.  She has been followed by Dr. Anne Fu since that and has done well but over the past year has developed progressive shortness of breath and fatigue with activity. She had an echo on 02/26/2018 showing progression to severe aortic stenosis with a mean gradient of 47 mmHg and a peak gradient of 71 mmHg.  The dimensionless index was 0.34.  Left ventricular ejection fraction was 55 to 60%.  She underwent cardiac catheterization on 03/06/2018 which showed no significant coronary disease.  The peak to peak gradient across the aortic valve was 32 mmHg.  She was evaluated by Dr. Excell Seltzer and a repeat TEE was performed to evaluate the mitral valve given her history of a mitral valve mass.  This showed no evidence of mitral valve mass or vegetation.  There is mild regurgitation.  The leaflets were mildly thickened with mild mitral annular calcification.  The aortic valve was severely calcified and restricted with a mean gradient of 36 mmHg.   She denies any chest pain or pressure.  She has had no dizziness or syncope.  She denies orthopnea, PND, and peripheral edema.   Her exertional shortness of breath and fatigue have progressed over the past few months so that any activity in her house causes her to have to rest.  Past Medical History:  Diagnosis Date  . Anemia   . Cardiac mass    a. 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 Cardiomyopathy in other disease   . Cataracts, bilateral   . Diabetes mellitus   . Elevated cholesterol   . Generalized osteoarthritis   . GERD (gastroesophageal reflux disease)   . Hypertension   . PAF (paroxysmal atrial fibrillation) (HCC)    a. s/p DCCV 06/2017, on Xarelto  . Phlebitis   . Severe aortic stenosis   . Stroke (HCC)   . Thrombophlebitis     Past Surgical History:  Procedure Laterality Date  . ABDOMINAL HYSTERECTOMY    . EYE SURGERY     cataract removal  . RIGHT/LEFT HEART CATH AND CORONARY ANGIOGRAPHY N/A 03/06/2018   Procedure: RIGHT/LEFT HEART CATH AND CORONARY ANGIOGRAPHY;  Surgeon: Lyn Records, MD;  Location: [facility];  Service: Cardiovascular;  Laterality: N/A;  . TEE WITHOUT CARDIOVERSION N/A 04/01/2018   Procedure: TRANSESOPHAGEAL ECHOCARDIOGRAM (TEE);  Surgeon: Jake Bathe, MD;  Location: [facility] ENDOSCOPY;  Service: Cardiovascular;  Laterality: N/A;  . TOTAL KNEE ARTHROPLASTY Right 04/14/2013   Dr August Saucer  . TOTAL KNEE ARTHROPLASTY Right 04/14/2013   Procedure: TOTAL KNEE ARTHROPLASTY;  Surgeon: Cammy Copa, MD;  Location: [facility] OR;  Service: Orthopedics;  Laterality: Right;    Family History  Problem Relation Age  of Onset  . Stroke Mother   . Hypertension Mother   . Hypertension Father   . Heart attack Neg Hx     Social History   Socioeconomic History  . Marital status: Widowed    Spouse name: Not on file  . Number of children: Not on file  . Years of education: Not on file  . Highest education level: Not on file  Occupational History  . Not on file  Social Needs  . Financial resource strain: Not on file  . Food insecurity:    Worry: Not on file    Inability: Not  on file  . Transportation needs:    Medical: Not on file    Non-medical: Not on file  Tobacco Use  . Smoking status: Never Smoker  . Smokeless tobacco: Never Used  Substance and Sexual Activity  . Alcohol use: No  . Drug use: No  . Sexual activity: Not Currently  Lifestyle  . Physical activity:    Days per week: Not on file    Minutes per session: Not on file  . Stress: Not on file  Relationships  . Social connections:    Talks on phone: Not on file    Gets together: Not on file    Attends religious service: Not on file    Active member of club or organization: Not on file    Attends meetings of clubs or organizations: Not on file    Relationship status: Not on file  . Intimate partner violence:    Fear of current or ex partner: Not on file    Emotionally abused: Not on file    Physically abused: Not on file    Forced sexual activity: Not on file  Other Topics Concern  . Not on file  Social History Narrative  . Not on file    Current Outpatient Medications  Medication Sig Dispense Refill  . amLODipine (NORVASC) 5 MG tablet Take 5 mg by mouth daily.    . Cholecalciferol (VITAMIN D3) 5000 units CAPS Take 5,000 Units by mouth daily.    . hydrochlorothiazide (HYDRODIURIL) 25 MG tablet Take 25 mg by mouth daily.    . metFORMIN (GLUCOPHAGE-XR) 750 MG 24 hr tablet TAKE 2 TABLETS BY MOUTH ONCE DAILY WITH BREAKFAST 60 tablet 3  . metoprolol succinate (TOPROL-XL) 50 MG 24 hr tablet Take 50 mg by mouth daily.     Bertram Gala Glycol-Propyl Glycol (SYSTANE OP) Place 1 drop into both eyes 2 (two) times daily.     . rivaroxaban (XARELTO) 20 MG TABS tablet Take 1 tablet (20 mg total) daily with supper by mouth. 30 tablet 11  . simvastatin (ZOCOR) 20 MG tablet Take 20 mg by mouth every evening.     No current facility-administered medications for this visit.     Allergies  Allergen Reactions  . Ace Inhibitors Other (See Comments)    angioedema  . Keflex [Cephalexin] Swelling and  Other (See Comments)    lips  . Rifadin [Rifampin] Swelling and Other (See Comments)    lips      Review of Systems:   General:  normal appetite, decreased energy, no weight gain, no weight loss, no fever  Cardiac:  no chest pain with exertion, no chest pain at rest, +SOB with mild exertion, no resting SOB, no PND, no orthopnea, no palpitations, + arrhythmia, + atrial fibrillation, no LE edema, no dizzy spells, no syncope  Respiratory:  + exertional shortness of breath, no home oxygen,  no productive cough, no dry cough, no bronchitis, no wheezing, no hemoptysis, no asthma, no pain with inspiration or cough, no sleep apnea, no CPAP at night  GI:   no difficulty swallowing, no reflux, no frequent heartburn, no hiatal hernia, no abdominal pain, no constipation, no diarrhea, no hematochezia, no hematemesis, no melena  GU:   no dysuria,  no frequency, no urinary tract infection, no hematuria, no kidney stones, no kidney disease  Vascular:  no pain suggestive of claudication, no pain in feet, no leg cramps, no varicose veins, no DVT, no non-healing foot ulcer  Neuro:   + stroke, no TIA's, no seizures, no headaches, no temporary blindness one eye,  no slurred speech, no peripheral neuropathy, no chronic pain, no instability of gait, no memory/cognitive dysfunction  Musculoskeletal: + arthritis, no joint swelling, no myalgias, no difficulty walking, normal mobility   Skin:   no rash, no itching, no skin infections, no pressure sores or ulcerations  Psych:   no anxiety, no depression, no nervousness, no unusual recent stress  Eyes:   no blurry vision, no floaters, no recent vision changes, does not wears glasses or contacts  ENT:   no hearing loss, no loose or painful teeth, full dentures  Hematologic:  no easy bruising, no abnormal bleeding, no clotting disorder, no frequent epistaxis  Endocrine:  + diabetes, does check CBG's at home           Physical Exam:   BP (!) 159/76 (BP Location: Left  Arm, Patient Position: Sitting, Cuff Size: Normal)   Pulse 76   Resp 18   Ht 5' 3.5" (1.613 m)   Wt 190 lb 6.4 oz (86.4 kg)   SpO2 97% Comment: RA  BMI 33.20 kg/m   General:  Elderly but  well-appearing  HEENT:  Unremarkable, NCAT, PERLA, EOMI, oropharynx clear  Neck:   no JVD, no bruits, no adenopathy or thyromegaly  Chest:   clear to auscultation, symmetrical breath sounds, no wheezes, no rhonchi   CV:   RRR, grade IV/VI crescendo/decrescendo murmur heard best at RUSB,  no diastolic murmur  Abdomen:  soft, non-tender, no masses or organomegaly  Extremities:  warm, well-perfused, pulses palpable bilaterally, no LE edema  Rectal/GU  Deferred  Neuro:   Grossly non-focal and symmetrical throughout  Skin:   Clean and dry, no rashes, no breakdown   Diagnostic Tests:        [facility]                        [address]                        [place], [place] [address]                            [phone]  ------------------------------------------------------------------- Transthoracic Echocardiography  Patient:    Irlanda, Croghan MR #:       [id number] Study Date: 02/26/2018 Gender:     F Age:        56 Height:     158.8 cm Weight:     87.9 kg BSA:        2.01 m^2 Pt. Status: Room:   Stephannie Li [id number]  ATTENDING    Marca Ancona, M.D.  SONOGRAPHER  Aida Raider, RDCS  ORDERING     Rosalio Macadamia  REFERRING  Rosalio Macadamia  PERFORMING   [facility], Outpatient  cc:  ------------------------------------------------------------------- LV EF: 55% -   60%  ------------------------------------------------------------------- Indications:      I35.9 Aortic Valve Disorder.  ------------------------------------------------------------------- History:   PMH:  Acquired from the patient and from the patient&'s chart.  PMH:  Cardiomyopathy. Anemia. Murmur. Stroke.  Risk factors:  Hypertension. Diabetes  mellitus.  ------------------------------------------------------------------- Study Conclusions  - Left ventricle: The cavity size was normal. Wall thickness was   increased in a pattern of moderate LVH. Systolic function was   normal. The estimated ejection fraction was in the range of 55%   to 60%. Wall motion was normal; there were no regional wall   motion abnormalities. Doppler parameters are consistent with   abnormal left ventricular relaxation (grade 1 diastolic   dysfunction). - Aortic valve: Trileaflet; severely calcified leaflets. There was   severe stenosis. There was mild regurgitation. Mean gradient (S):   47 mm Hg. Peak gradient (S): 71 mm Hg. - Mitral valve: There was mild regurgitation. - Left atrium: The atrium was mildly dilated. - Right ventricle: The cavity size was normal. Systolic function   was normal. - Tricuspid valve: Peak RV-RA gradient (S): 45 mm Hg. - Pulmonary arteries: PA peak pressure: 53 mm Hg (S). - Systemic veins: IVC measured 2.2 cm with > 50% respirophasic   variation, suggesting RA pressure 8 mmHg.  Impressions:  - Normal LV size with moderate LV hypertrophy. EF 55-60%. Severe   aortic stenosis with mild aortic insufficiency. Normal RV size   and systolic function. Moderate pulmonary hypertension.  ------------------------------------------------------------------- Study data:   Study status:  Routine.  Procedure:  The patient reported no pain pre or post test. Transthoracic echocardiography for left ventricular function evaluation, for right ventricular function evaluation, and for assessment of valvular function. Image quality was adequate.  Study completion:  There were no complications.          Transthoracic echocardiography.  M-mode, complete 2D, spectral Doppler, and color Doppler.  Birthdate: Patient birthdate: 06-15-1941.  Age:  Patient is 77 yr old.  Sex: Gender: female.    BMI: 34.9 kg/m^2.  Blood pressure:      160/70 Patient status:  Outpatient.  Study date:  Study date: 02/26/2018. Study time: 09:46 AM.  Location:  [facility]  -------------------------------------------------------------------  ------------------------------------------------------------------- Left ventricle:  The cavity size was normal. Wall thickness was increased in a pattern of moderate LVH. Systolic function was normal. The estimated ejection fraction was in the range of 55% to 60%. Wall motion was normal; there were no regional wall motion abnormalities. Doppler parameters are consistent with abnormal left ventricular relaxation (grade 1 diastolic dysfunction).  ------------------------------------------------------------------- Aortic valve:   Trileaflet; severely calcified leaflets.  Doppler:  There was severe stenosis.   There was mild regurgitation.    VTI ratio of LVOT to aortic valve: 0.36. Valve area (VTI): 1.09 cm^2. Indexed valve area (VTI): 0.54 cm^2/m^2. Peak velocity ratio of LVOT to aortic valve: 0.34. Valve area (Vmax): 1.03 cm^2. Indexed valve area (Vmax): 0.51 cm^2/m^2. Mean velocity ratio of LVOT to aortic valve: 0.36. Valve area (Vmean): 1.08 cm^2. Indexed valve area (Vmean): 0.54 cm^2/m^2.    Mean gradient (S): 47 mm Hg. Peak gradient (S): 71 mm Hg.  ------------------------------------------------------------------- Aorta:  Aortic root: The aortic root was normal in size. Ascending aorta: The ascending aorta was normal in size.  ------------------------------------------------------------------- Mitral valve:   Moderately calcified leaflets .  Doppler:   There was no evidence for stenosis.  There was mild regurgitation. Peak gradient (D): 6 mm Hg.  ------------------------------------------------------------------- Left atrium:  The atrium was mildly dilated.  ------------------------------------------------------------------- Right ventricle:  The cavity size was normal.  Systolic function was normal.  ------------------------------------------------------------------- Pulmonic valve:    Structurally normal valve.   Cusp separation was normal.  Doppler:  Transvalvular velocity was within the normal range. There was trivial regurgitation.  ------------------------------------------------------------------- Tricuspid valve:   Doppler:  There was mild regurgitation.  ------------------------------------------------------------------- Right atrium:  The atrium was normal in size.  ------------------------------------------------------------------- Pericardium:  There was no pericardial effusion.  ------------------------------------------------------------------- Systemic veins:  IVC measured 2.2 cm with > 50% respirophasic variation, suggesting RA pressure 8 mmHg.  ------------------------------------------------------------------- Measurements   Left ventricle                           Value           Reference  LV ID, ED, PLAX chordal          (L)     34.6   mm       43 - 52  LV ID, ES, PLAX chordal                  23     mm       23 - 38  LV fx shortening, PLAX chordal           34     %        >=29  LV PW thickness, ED                      12.3   mm       ---------  IVS/LV PW ratio, ED              (H)     1.41            <=1.3  Stroke volume, 2D                        121    ml       ---------  Stroke volume/bsa, 2D                    60     ml/m^2   ---------  LV e&', lateral                           7.35   cm/s     ---------  LV E/e&', lateral                         16.6            ---------  LV e&', medial                            5.44   cm/s     ---------  LV E/e&', medial                          22.43           ---------  LV e&', average                           6.4  cm/s     ---------  LV E/e&', average                         19.08           ---------  LV ejection time                         340    ms       ---------     Ventricular septum                       Value           Reference  IVS thickness, ED                        17.4   mm       ---------    LVOT                                     Value           Reference  LVOT ID, S                               19.6   mm       ---------  LVOT area                                3.02   cm^2     ---------  LVOT ID                                  20     mm       ---------  LVOT peak velocity, S                    142    cm/s     ---------  LVOT mean velocity, S                    113    cm/s     ---------  LVOT VTI, S                              38.4   cm       ---------  LVOT peak gradient, S                    8      mm Hg    ---------  Stroke volume (SV), LVOT DP              115.9  ml       ---------  Stroke index (SV/bsa), LVOT DP           57.7   ml/m^2   ---------    Aortic valve                             Value           Reference  Aortic  valve peak velocity, S            421.26 cm/s     ---------  Aortic valve mean velocity, S            314.41 cm/s     ---------  Aortic valve VTI, S                      106.28 cm       ---------  Aortic mean gradient, S                  44     mm Hg    ---------  Aortic peak gradient, S                  71     mm Hg    ---------  VTI ratio, LVOT/AV                       0.36            ---------  Aortic valve area, VTI                   1.09   cm^2     ---------  Aortic valve area/bsa, VTI               0.54   cm^2/m^2 ---------  Velocity ratio, peak, LVOT/AV            0.34            ---------  Aortic valve area, peak velocity         1.03   cm^2     ---------  Aortic valve area/bsa, peak              0.51   cm^2/m^2 ---------  velocity  Velocity ratio, mean, LVOT/AV            0.36            ---------  Aortic valve area, mean velocity         1.08   cm^2     ---------  Aortic valve area/bsa, mean              0.54   cm^2/m^2 ---------  velocity  Aortic regurg peak velocity              158.91 cm/s      ---------  Aortic regurg pressure half-time         339    ms       ---------  Aortic regurg peak gradient              10     mm Hg    ---------    Aorta                                    Value           Reference  Aortic root ID, ED                       29     mm       ---------  Ascending aorta ID, A-P, S               35     mm       ---------  Left atrium                              Value           Reference  LA ID, A-P, ES                           44     mm       ---------  LA ID/bsa, A-P                           2.19   cm/m^2   <=2.2  LA volume, S                             68     ml       ---------  LA volume/bsa, S                         33.9   ml/m^2   ---------  LA volume, ES, 1-p A4C                   51     ml       ---------  LA volume/bsa, ES, 1-p A4C               25.4   ml/m^2   ---------  LA volume, ES, 1-p A2C                   79     ml       ---------  LA volume/bsa, ES, 1-p A2C               39.3   ml/m^2   ---------    Mitral valve                             Value           Reference  Mitral E-wave peak velocity              122    cm/s     ---------  Mitral A-wave peak velocity              149    cm/s     ---------  Mitral deceleration time         (H)     356    ms       150 - 230  Mitral peak gradient, D                  6      mm Hg    ---------  Mitral E/A ratio, peak                   0.8             ---------    Pulmonary arteries                       Value           Reference  PA pressure, S, DP               (H)     53  mm Hg    <=30    Tricuspid valve                          Value           Reference  Tricuspid regurg peak velocity           335    cm/s     ---------  Tricuspid peak RV-RA gradient            45     mm Hg    ---------    Right ventricle                          Value           Reference  RV s&', lateral, S                        7.35   cm/s     ---------  Legend: (L)  and  (H)  mark values outside specified  reference range.  ------------------------------------------------------------------- Prepared and Electronically Authenticated by  Marca Ancona, M.D. 2019-05-22T14:57:03   Physicians   Panel Physicians Referring Physician Case Authorizing Physician  Lyn Records, MD (Primary)    Procedures   RIGHT/LEFT HEART CATH AND CORONARY ANGIOGRAPHY  Conclusion    Calcific aortic stenosis with peak to peak gradient of 32 mmHg.  Valve is heavily calcified by cine fluoroscopy.  Calculated aortic valve area 1.62 cm related to a calculated/estimated abnormally high cardiac output of 7.8 L/min  Technically difficult coronary angiography from the right radial due to heavy calcification in the iliac/subclavian preventing accurate catheter control.  Normal left main  Large widely patent LAD without obstructive disease  Large widely patent circumflex without obstructive disease  RCA never selectively engaged but visualized in the proximal mid and distal vessel is widely patent.  No evidence of pulmonary hypertension   RECOMMENDATIONS:   There is discordance between echo and hemodynamic data with reference to severity of aortic valve disease.  There is no other explanation for the patient's progressive dyspnea.  I would recommend referring the patient to the aortic valve clinic to determine if proceeding with valve therapy is reasonable.    Indications   Aortic stenosis, severe [I35.0 (ICD-10-CM)]  DOE (dyspnea on exertion) [R06.09 (ICD-10-CM)]  Chronic diastolic HF (heart failure), NYHA class 3 (HCC) [I50.32 (ICD-10-CM)]  Procedural Details/Technique   Technical Details The right radial area was sterilely prepped and draped. Intravenous sedation with Versed and fentanyl was administered. 1% Xylocaine was infiltrated to achieve local analgesia. Using real-time vascular ultrasound, a double wall stick with an angiocath was utilized to obtain intra-arterial access. A VUS image was  saved for the permanent record.The modified Seldinger technique was used to place a 66F " Slender" sheath in the right radial artery. Weight based heparin was administered. Coronary angiography was done using 5 F catheters. Right coronary angiography was performed with a JR4. Left ventricular hemodymic recordings and angiography was done using the JR 4 catheter and hand injection. Left coronary angiography was performed with a JL 3.5 cm.  Right heart catheterization was performed by exchanging a previously placed antecubital IV angio-cath for a 5 French Slender sheath. 1% Xylocaine was used to locally nesthetize the area around the IV site. The IV catheter was wired using an .018 guidewire. The modified Seldinger technique was used to place the 5 [place]  sheath. Double glove technique was used to enhance sterility. After sheath insertion, right heart cath was performed using a 5 French balloon tipped catheter and fluoroscopic guidance. Pressures were recorded in each chamber and in the pulmonary capillary wedge position.. The main pulmonary artery O2 saturation was sampled.   Hemostasis was achieved using a pneumatic band.  During this procedure the patient is administered a total of Versed 1 mg and Fentanyl 25 mg to achieve and maintain moderate conscious sedation. The patient's heart rate, blood pressure, and oxygen saturation are monitored continuously during the procedure. The period of conscious sedation is 52 minutes, of which I was present face-to-face 100% of this time.   Estimated blood loss <50 mL.  During this procedure the patient was administered the following to achieve and maintain moderate conscious sedation: Versed 1 mg, Fentanyl 25 mcg, while the patient's heart rate, blood pressure, and oxygen saturation were continuously monitored. The period of conscious sedation was 52 minutes, of which I was present face-to-face 100% of this time.  Coronary Findings   Diagnostic  Dominance: Right   No diagnostic findings have been documented.  Intervention   No interventions have been documented.  Right Heart   Right Heart Pressures Hemodynamic findings consistent with pulmonary hypertension. LV EDP is normal.  Left Heart   Left Ventricle LV end diastolic pressure is normal.  Coronary Diagrams   Diagnostic Diagram       Implants    No implant documentation for this case.  MERGE Images   Show images for CARDIAC CATHETERIZATION   Link to Procedure Log   Procedure Log    Hemo Data    Most Recent Value  Fick Cardiac Output 7.81 L/min  Fick Cardiac Output Index 4.03 (L/min)/BSA  Aortic Mean Gradient 22.1 mmHg  Aortic Peak Gradient 32 mmHg  Aortic Valve Area 1.62  Aortic Value Area Index 0.83 cm2/BSA  RA A Wave 7 mmHg  RA V Wave 1 mmHg  RA Mean 1 mmHg  RV Systolic Pressure 36 mmHg  RV Diastolic Pressure 3 mmHg  RV EDP 7 mmHg  PA Systolic Pressure 31 mmHg  PA Diastolic Pressure 16 mmHg  PA Mean 23 mmHg  PW A Wave 11 mmHg  PW V Wave 8 mmHg  PW Mean 9 mmHg  AO Systolic Pressure 139 mmHg  AO Diastolic Pressure 65 mmHg  AO Mean 95 mmHg  LV Systolic Pressure 175 mmHg  LV Diastolic Pressure 7 mmHg  LV EDP 12 mmHg  Arterial Occlusion Pressure Extended Systolic Pressure 140 mmHg  Arterial Occlusion Pressure Extended Diastolic Pressure 65 mmHg  Arterial Occlusion Pressure Extended Mean Pressure 96 mmHg  Left Ventricular Apex Extended Systolic Pressure 172 mmHg  Left Ventricular Apex Extended Diastolic Pressure 6 mmHg  Left Ventricular Apex Extended EDP Pressure 10 mmHg  QP/QS 1  TPVR Index 5.72 HRUI  TSVR Index 23.61 HRUI  PVR SVR Ratio 0.15  TPVR/TSVR Ratio 0.24    ADDENDUM REPORT: 04/10/2018 12:08  CLINICAL DATA:  77 year old female with severe aortic stenosis being evaluated for a TAVR procedure.  EXAM: Cardiac TAVR CT  TECHNIQUE: The patient was scanned on a [employer]. A 120 kV retrospective scan was triggered in the descending  thoracic aorta at 111 HU's. Gantry rotation speed was 250 msecs and collimation was .6 mm. No beta blockade or nitro were given. The 3D data set was reconstructed in 5% intervals of the R-R cycle. Systolic and diastolic phases were analyzed on a dedicated work station using  MPR, MIP and VRT modes. The patient received 80 cc of contrast.  FINDINGS: Aortic Valve: Trileaflet aortic valve with severely thickened and calcified leaflets and severely restricted leaflets opening, calculated AVA 1.3 cm2. Only trivial calcifications are extending into the LVOT.  Aorta: Normal size, mild diffuse atheroma and calcifications.  Sinotubular Junction: 30 x 29 mm  Ascending Thoracic Aorta: 37 x 35 mm  Aortic Arch: 28 x 24 mm  Descending Thoracic Aorta: 25 x 23 mm  Sinus of Valsalva Measurements:  Non-coronary: 27 mm  Right -coronary: 30 mm  Left -coronary: 28 mm  Sinus of Valsalva Height:  Right -coronary: 22 mm  Left -coronary: 18 mm  Coronary Artery Height above Annulus:  Left Main: 11 mm  Right Coronary: 15 mm  Virtual Basal Annulus Measurements:  Maximum/Minimum Diameter: 23.8 x 21.0 mm  Mean Diameter: 21.5 mm  Perimeter: 69.3 mm  Area:  362 mm2  Optimum Fluoroscopic Angle for Delivery: RAO 5 CRA 5  IMPRESSION: 1. Trileaflet aortic valve with severely thickened and calcified leaflets and severely restricted leaflets opening, calculated AVA 1.3 cm 2. Only trivial calcifications are extending into the LVOT. Annular measurements suitable for delivery of a 23 mm Edwards-SAPIEN 3 valve or a 26 mm Evolut R Medtronic valve.  2. Sufficient coronary to annulus distance.  3. Optimum Fluoroscopic Angle for Delivery:  RAO 5 CRA 5  4. No thrombus in the left atrial appendage.  5. Dilated pulmonary artery measuring 34 mm suggestive of pulmonary hypertension.   Electronically Signed   By: Tobias Alexander   On: 04/10/2018 12:08   Addended  by Lars Masson, MD on 04/10/2018 12:11 PM    Study Result   EXAM: OVER-READ INTERPRETATION  CT CHEST  The following report is an over-read performed by radiologist Dr. Trudie Reed of [facility] on 04/09/2018. This over-read does not include interpretation of cardiac or coronary anatomy or pathology. The coronary calcium score/coronary CTA interpretation by the cardiologist is attached.  COMPARISON:  Chest CT 10/29/2009.  FINDINGS: Extracardiac findings will be described separately under dictation for contemporaneously obtained CTA chest, abdomen pelvis.  IMPRESSION: Please see separate dictation for contemporaneously obtained CTA chest, abdomen and pelvis dated 04/09/2018 for full description of relevant extracardiac findings.  Electronically Signed: By: Trudie Reed M.D. On: 04/09/2018 13:50       CLINICAL DATA:  77 year old female with history of severe aortic valve stenosis. Preprocedural study prior to potential transcatheter aortic valve replacement (TAVR) procedure.  EXAM: CT ANGIOGRAPHY CHEST, ABDOMEN AND PELVIS  TECHNIQUE: Multidetector CT imaging through the chest, abdomen and pelvis was performed using the standard protocol during bolus administration of intravenous contrast. Multiplanar reconstructed images and MIPs were obtained and reviewed to evaluate the vascular anatomy.  CONTRAST:  95mL ISOVUE-370 IOPAMIDOL (ISOVUE-370) INJECTION 76%  COMPARISON:  None.  FINDINGS: CTA CHEST FINDINGS  Cardiovascular: Mild cardiomegaly. There is no significant pericardial fluid, thickening or pericardial calcification. Aortic atherosclerosis. No definite coronary artery calcifications. Severe thickening calcification of the aortic valve. Mild calcification of the mitral annulus.  Mediastinum/Lymph Nodes: No pathologically enlarged mediastinal or hilar lymph nodes. Esophagus is unremarkable in appearance. Heterogeneous  appearance of the thyroid gland, presumably reflective of a multinodular goiter. No axillary lymphadenopathy.  Lungs/Pleura: Patchy multifocal ground-glass attenuation in the lungs bilaterally, favored to reflect a background of very mild interstitial pulmonary edema. No consolidative airspace disease. No pleural effusions. No suspicious appearing pulmonary nodules or masses.  Musculoskeletal/Soft Tissues: There are no aggressive appearing lytic  or blastic lesions noted in the visualized portions of the skeleton.  CTA ABDOMEN AND PELVIS FINDINGS  Hepatobiliary: No suspicious cystic or solid hepatic lesions. No intra or extrahepatic biliary ductal dilatation. Gallbladder is normal in appearance.  Pancreas: No pancreatic mass. No pancreatic ductal dilatation. No pancreatic or peripancreatic fluid or inflammatory changes.  Spleen: Unremarkable.  Adrenals/Urinary Tract: Mild multifocal cortical thinning in the kidneys bilaterally. No suspicious renal lesions. No hydroureteronephrosis. Bilateral adrenal glands are normal in appearance. Urinary bladder is normal in appearance.  Stomach/Bowel: Normal appearance of the stomach. No pathologic dilatation of small bowel or colon. Normal appendix.  Vascular/Lymphatic: Aortic atherosclerosis, without evidence of aneurysm or dissection in the abdominal or pelvic vasculature. Vascular findings and measurements pertinent to potential TAVR procedure, as detailed below. Celiac axis, superior mesenteric artery and inferior mesenteric artery are all widely patent without hemodynamically significant stenosis. Single renal arteries bilaterally are widely patent without hemodynamically significant stenosis. No lymphadenopathy noted in the abdomen or pelvis.  Reproductive: Status post hysterectomy. Ovaries are not confidently identified may be surgically absent or atrophic.  Other: Trace volume of ascites in the low anatomic pelvis.  No pneumoperitoneum.  Musculoskeletal: There are no aggressive appearing lytic or blastic lesions noted in the visualized portions of the skeleton.  VASCULAR MEASUREMENTS PERTINENT TO TAVR:  AORTA:  Minimal Aortic Diameter-15 x 15 mm  Severity of Aortic Calcification-mild  RIGHT PELVIS:  Right Common Iliac Artery -  Minimal Diameter-10.2 x 9.8 mm  Tortuosity-mild  Calcification-mild  Right External Iliac Artery -  Minimal Diameter-8.1 x 7.3 mm  Tortuosity-mild  Calcification-none  Right Common Femoral Artery -  Minimal Diameter-7.6 x 7.9 mm  Tortuosity-mild  Calcification - none  LEFT PELVIS:  Left Common Iliac Artery -  Minimal Diameter-10.1 x 9.6 mm  Tortuosity-mild  Calcification - none  Left External Iliac Artery -  Minimal Diameter-6.6 x 6.9 mm  Tortuosity-mild  Calcification - none  Left Common Femoral Artery -  Minimal Diameter-7.1 x 7.3 mm  Tortuosity-mild  Calcification - none  Review of the MIP images confirms the above findings.  IMPRESSION: 1. Aortic atherosclerosis with vascular findings and measurements pertinent to potential TAVR procedure, as detailed above. 2. Severe thickening calcification of the aortic valve, compatible with the reported clinical history of severe aortic stenosis. 3. Cardiomegaly with what appears to be mild interstitial pulmonary edema, which may suggest mild congestive heart failure. 4. Additional incidental findings, as above.   Electronically Signed   By: Trudie Reed M.D.   On: 04/09/2018 16:52   Impression:  This 77 year old woman has stage D, severe, symptomatic aortic stenosis with New York Heart Association class III symptoms of exertional fatigue and shortness of breath consistent with chronic diastolic congestive heart failure.  I have personally reviewed her 2D echocardiogram, TEE, and cardiac catheterization.  Her 2D echocardiogram shows a  trileaflet severely calcified aortic valve with a mean gradient of 47 mmHg consistent with severe aortic stenosis.  Left ventricular ejection fraction is 55 to 60%.  TEE does not show any mass on the mitral valve.  Cardiac catheterization shows no significant coronary disease.  I agree that aortic valve replacement is the best treatment for this patient to improve her symptoms and quality of life and to prevent progressive left ventricular deterioration.  I think she would be at moderate risk for open surgical aortic valve replacement and therefore transcatheter aortic valve replacement would be a reasonable alternative for her.  Her gated cardiac CTA shows anatomy that is  favorable for TAVR using a Sapien 3 valve with no complicating features.  Her abdominal and pelvic CTA shows adequate pelvic arterial anatomy to allow transfemoral insertion.  The patient was counseled at length regarding treatment alternatives for management of severe symptomatic aortic stenosis. The risks and benefits of surgical intervention has been discussed in detail. Long-term prognosis with medical therapy was discussed. Alternative approaches such as conventional surgical aortic valve replacement, transcatheter aortic valve replacement, and palliative medical therapy were compared and contrasted at length. This discussion was placed in the context of the patient's own specific clinical presentation and past medical history. All of her questions have been addressed.   Following the decision to proceed with transcatheter aortic valve replacement, a discussion was held regarding what types of management strategies would be attempted intraoperatively in the event of life-threatening complications, including whether or not the patient would be considered a candidate for the use of cardiopulmonary bypass and/or conversion to open sternotomy for attempted surgical intervention.    The patient has been advised of a variety of complications  that might develop including but not limited to risks of death, stroke, paravalvular leak, aortic dissection or other major vascular complications, aortic annulus rupture, device embolization, cardiac rupture or perforation, mitral regurgitation, acute myocardial infarction, arrhythmia, heart block or bradycardia requiring permanent pacemaker placement, congestive heart failure, respiratory failure, renal failure, pneumonia, infection, other late complications related to structural valve deterioration or migration, or other complications that might ultimately cause a temporary or permanent loss of functional independence or other long term morbidity. The patient provides full informed consent for the procedure as described and all questions were answered.     Plan:  Transfemoral transcatheter aortic valve replacement will be scheduled for Tuesday, 04/15/2018.  She has been instructed to hold her Xarelto 4 days preoperatively and to hold the metformin 48 hours preoperatively.  I spent 60 minutes performing this consultation and > 50% of this time was spent face to face counseling and coordinating the care of this patient's severe symptomatic aortic stenosis.    Alleen Borne, MD 04/09/2018

## 2018-04-10 NOTE — H&P (View-Only) (Signed)
Patient ID: Janet Williamson, female   DOB: 09/04/1941, 77 y.o.   MRN: [id number]   [facility]  MULTIDISCIPLINARY HEART VALVE CLINIC  CARDIOTHORACIC SURGERY CONSULTATION REPORT  Referring Provider is Skains, Mark C, MD PCP is [name], [name], MD  Reason for consultation: Severe aortic stenosis  HPI:  The patient is a 77-year-old woman with history of diabetes, hyperlipidemia, hypertension, paroxysmal atrial fibrillation on Xarelto status post cardioversion in September 2018, prior embolic stroke in 2008, and aortic stenosis who was referred for consideration of transcatheter aortic valve replacement.  I had seen her in 2008 when she presented with an embolic stroke and was found to have a mass on her mitral valve.  It was unclear if this was a fibroblastoma or papilloma or possibly dystrophic calcification and we decided to follow it.  She has been followed by Dr. Skains since that and has done well but over the past year has developed progressive shortness of breath and fatigue with activity. She had an echo on 02/26/2018 showing progression to severe aortic stenosis with a mean gradient of 47 mmHg and a peak gradient of 71 mmHg.  The dimensionless index was 0.34.  Left ventricular ejection fraction was 55 to 60%.  She underwent cardiac catheterization on 03/06/2018 which showed no significant coronary disease.  The peak to peak gradient across the aortic valve was 32 mmHg.  She was evaluated by Dr. Cooper and a repeat TEE was performed to evaluate the mitral valve given her history of a mitral valve mass.  This showed no evidence of mitral valve mass or vegetation.  There is mild regurgitation.  The leaflets were mildly thickened with mild mitral annular calcification.  The aortic valve was severely calcified and restricted with a mean gradient of 36 mmHg.   She denies any chest pain or pressure.  She has had no dizziness or syncope.  She denies orthopnea, PND, and peripheral edema.   Her exertional shortness of breath and fatigue have progressed over the past few months so that any activity in her house causes her to have to rest.  Past Medical History:  Diagnosis Date  . Anemia   . Cardiac mass    a. 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 Cardiomyopathy in other disease   . Cataracts, bilateral   . Diabetes mellitus   . Elevated cholesterol   . Generalized osteoarthritis   . GERD (gastroesophageal reflux disease)   . Hypertension   . PAF (paroxysmal atrial fibrillation) (HCC)    a. s/Williamson DCCV 06/2017, on Xarelto  . Phlebitis   . Severe aortic stenosis   . Stroke (HCC)   . Thrombophlebitis     Past Surgical History:  Procedure Laterality Date  . ABDOMINAL HYSTERECTOMY    . EYE SURGERY     cataract removal  . RIGHT/LEFT HEART CATH AND CORONARY ANGIOGRAPHY N/A 03/06/2018   Procedure: RIGHT/LEFT HEART CATH AND CORONARY ANGIOGRAPHY;  Surgeon: Smith, Henry W, MD;  Location: [facility];  Service: Cardiovascular;  Laterality: N/A;  . TEE WITHOUT CARDIOVERSION N/A 04/01/2018   Procedure: TRANSESOPHAGEAL ECHOCARDIOGRAM (TEE);  Surgeon: Skains, Mark C, MD;  Location: [facility];  Service: Cardiovascular;  Laterality: N/A;  . TOTAL KNEE ARTHROPLASTY Right 04/14/2013   Dr Dean  . TOTAL KNEE ARTHROPLASTY Right 04/14/2013   Procedure: TOTAL KNEE ARTHROPLASTY;  Surgeon: Gregory Scott Dean, MD;  Location: [facility] OR;  Service: Orthopedics;  Laterality: Right;    Family History  Problem Relation Age   of Onset  . Stroke Mother   . Hypertension Mother   . Hypertension Father   . Heart attack Neg Hx     Social History   Socioeconomic History  . Marital status: Widowed    Spouse name: Not on file  . Number of children: Not on file  . Years of education: Not on file  . Highest education level: Not on file  Occupational History  . Not on file  Social Needs  . Financial resource strain: Not on file  . Food insecurity:    Worry: Not on file    Inability: Not  on file  . Transportation needs:    Medical: Not on file    Non-medical: Not on file  Tobacco Use  . Smoking status: Never Smoker  . Smokeless tobacco: Never Used  Substance and Sexual Activity  . Alcohol use: No  . Drug use: No  . Sexual activity: Not Currently  Lifestyle  . Physical activity:    Days per week: Not on file    Minutes per session: Not on file  . Stress: Not on file  Relationships  . Social connections:    Talks on phone: Not on file    Gets together: Not on file    Attends religious service: Not on file    Active member of club or organization: Not on file    Attends meetings of clubs or organizations: Not on file    Relationship status: Not on file  . Intimate partner violence:    Fear of current or ex partner: Not on file    Emotionally abused: Not on file    Physically abused: Not on file    Forced sexual activity: Not on file  Other Topics Concern  . Not on file  Social History Narrative  . Not on file    Current Outpatient Medications  Medication Sig Dispense Refill  . amLODipine (NORVASC) 5 MG tablet Take 5 mg by mouth daily.    . Cholecalciferol (VITAMIN D3) 5000 units CAPS Take 5,000 Units by mouth daily.    . hydrochlorothiazide (HYDRODIURIL) 25 MG tablet Take 25 mg by mouth daily.    . metFORMIN (GLUCOPHAGE-XR) 750 MG 24 hr tablet TAKE 2 TABLETS BY MOUTH ONCE DAILY WITH BREAKFAST 60 tablet 3  . metoprolol succinate (TOPROL-XL) 50 MG 24 hr tablet Take 50 mg by mouth daily.     . Polyethyl Glycol-Propyl Glycol (SYSTANE OP) Place 1 drop into both eyes 2 (two) times daily.     . rivaroxaban (XARELTO) 20 MG TABS tablet Take 1 tablet (20 mg total) daily with supper by mouth. 30 tablet 11  . simvastatin (ZOCOR) 20 MG tablet Take 20 mg by mouth every evening.     No current facility-administered medications for this visit.     Allergies  Allergen Reactions  . Ace Inhibitors Other (See Comments)    angioedema  . Keflex [Cephalexin] Swelling and  Other (See Comments)    lips  . Rifadin [Rifampin] Swelling and Other (See Comments)    lips      Review of Systems:   General:  normal appetite, decreased energy, no weight gain, no weight loss, no fever  Cardiac:  no chest pain with exertion, no chest pain at rest, +SOB with mild exertion, no resting SOB, no PND, no orthopnea, no palpitations, + arrhythmia, + atrial fibrillation, no LE edema, no dizzy spells, no syncope  Respiratory:  + exertional shortness of breath, no home oxygen,   no productive cough, no dry cough, no bronchitis, no wheezing, no hemoptysis, no asthma, no pain with inspiration or cough, no sleep apnea, no CPAP at night  GI:   no difficulty swallowing, no reflux, no frequent heartburn, no hiatal hernia, no abdominal pain, no constipation, no diarrhea, no hematochezia, no hematemesis, no melena  GU:   no dysuria,  no frequency, no urinary tract infection, no hematuria, no kidney stones, no kidney disease  Vascular:  no pain suggestive of claudication, no pain in feet, no leg cramps, no varicose veins, no DVT, no non-healing foot ulcer  Neuro:   + stroke, no TIA's, no seizures, no headaches, no temporary blindness one eye,  no slurred speech, no peripheral neuropathy, no chronic pain, no instability of gait, no memory/cognitive dysfunction  Musculoskeletal: + arthritis, no joint swelling, no myalgias, no difficulty walking, normal mobility   Skin:   no rash, no itching, no skin infections, no pressure sores or ulcerations  Psych:   no anxiety, no depression, no nervousness, no unusual recent stress  Eyes:   no blurry vision, no floaters, no recent vision changes, does not wears glasses or contacts  ENT:   no hearing loss, no loose or painful teeth, full dentures  Hematologic:  no easy bruising, no abnormal bleeding, no clotting disorder, no frequent epistaxis  Endocrine:  + diabetes, does check CBG's at home           Physical Exam:   BP (!) 159/76 (BP Location: Left  Arm, Patient Position: Sitting, Cuff Size: Normal)   Pulse 76   Resp 18   Ht 5' 3.5" (1.613 m)   Wt 190 lb 6.4 oz (86.4 kg)   SpO2 97% Comment: RA  BMI 33.20 kg/m   General:  Elderly but  well-appearing  HEENT:  Unremarkable, NCAT, PERLA, EOMI, oropharynx clear  Neck:   no JVD, no bruits, no adenopathy or thyromegaly  Chest:   clear to auscultation, symmetrical breath sounds, no wheezes, no rhonchi   CV:   RRR, grade IV/VI crescendo/decrescendo murmur heard best at RUSB,  no diastolic murmur  Abdomen:  soft, non-tender, no masses or organomegaly  Extremities:  warm, well-perfused, pulses palpable bilaterally, no LE edema  Rectal/GU  Deferred  Neuro:   Grossly non-focal and symmetrical throughout  Skin:   Clean and dry, no rashes, no breakdown   Diagnostic Tests:        *[facility]*                        [address]                        [place], [place] [address]                            [phone]  ------------------------------------------------------------------- Transthoracic Echocardiography  Patient:    Janet Williamson, Janet Williamson MR #:       [id number] Study Date: 02/26/2018 Gender:     F Age:        77 Height:     158.8 cm Weight:     87.9 kg BSA:        2.01 m^2 Pt. Status: Room:   REFERRING    [name], [name] [id number]  ATTENDING    Dalton McLean, M.D.  SONOGRAPHER  NaTashia Rodgers, RDCS  ORDERING     Gerhardt, Lori C  REFERRING      Gerhardt, Lori C  PERFORMING   [facility], Outpatient  cc:  ------------------------------------------------------------------- LV EF: 55% -   60%  ------------------------------------------------------------------- Indications:      I35.9 Aortic Valve Disorder.  ------------------------------------------------------------------- History:   PMH:  Acquired from the patient and from the patient&'s chart.  PMH:  Cardiomyopathy. Anemia. Murmur. Stroke.  Risk factors:  Hypertension. Diabetes  mellitus.  ------------------------------------------------------------------- Study Conclusions  - Left ventricle: The cavity size was normal. Wall thickness was   increased in a pattern of moderate LVH. Systolic function was   normal. The estimated ejection fraction was in the range of 55%   to 60%. Wall motion was normal; there were no regional wall   motion abnormalities. Doppler parameters are consistent with   abnormal left ventricular relaxation (grade 1 diastolic   dysfunction). - Aortic valve: Trileaflet; severely calcified leaflets. There was   severe stenosis. There was mild regurgitation. Mean gradient (S):   47 mm Hg. Peak gradient (S): 71 mm Hg. - Mitral valve: There was mild regurgitation. - Left atrium: The atrium was mildly dilated. - Right ventricle: The cavity size was normal. Systolic function   was normal. - Tricuspid valve: Peak RV-RA gradient (S): 45 mm Hg. - Pulmonary arteries: PA peak pressure: 53 mm Hg (S). - Systemic veins: IVC measured 2.2 cm with > 50% respirophasic   variation, suggesting RA pressure 8 mmHg.  Impressions:  - Normal LV size with moderate LV hypertrophy. EF 55-60%. Severe   aortic stenosis with mild aortic insufficiency. Normal RV size   and systolic function. Moderate pulmonary hypertension.  ------------------------------------------------------------------- Study data:   Study status:  Routine.  Procedure:  The patient reported no pain pre or post test. Transthoracic echocardiography for left ventricular function evaluation, for right ventricular function evaluation, and for assessment of valvular function. Image quality was adequate.  Study completion:  There were no complications.          Transthoracic echocardiography.  M-mode, complete 2D, spectral Doppler, and color Doppler.  Birthdate: Patient birthdate: 11/21/1940.  Age:  Patient is 77 yr old.  Sex: Gender: female.    BMI: 34.9 kg/m^2.  Blood pressure:      160/70 Patient status:  Outpatient.  Study date:  Study date: 02/26/2018. Study time: 09:46 AM.  Location:  [facility]  -------------------------------------------------------------------  ------------------------------------------------------------------- Left ventricle:  The cavity size was normal. Wall thickness was increased in a pattern of moderate LVH. Systolic function was normal. The estimated ejection fraction was in the range of 55% to 60%. Wall motion was normal; there were no regional wall motion abnormalities. Doppler parameters are consistent with abnormal left ventricular relaxation (grade 1 diastolic dysfunction).  ------------------------------------------------------------------- Aortic valve:   Trileaflet; severely calcified leaflets.  Doppler:  There was severe stenosis.   There was mild regurgitation.    VTI ratio of LVOT to aortic valve: 0.36. Valve area (VTI): 1.09 cm^2. Indexed valve area (VTI): 0.54 cm^2/m^2. Peak velocity ratio of LVOT to aortic valve: 0.34. Valve area (Vmax): 1.03 cm^2. Indexed valve area (Vmax): 0.51 cm^2/m^2. Mean velocity ratio of LVOT to aortic valve: 0.36. Valve area (Vmean): 1.08 cm^2. Indexed valve area (Vmean): 0.54 cm^2/m^2.    Mean gradient (S): 47 mm Hg. Peak gradient (S): 71 mm Hg.  ------------------------------------------------------------------- Aorta:  Aortic root: The aortic root was normal in size. Ascending aorta: The ascending aorta was normal in size.  ------------------------------------------------------------------- Mitral valve:   Moderately calcified leaflets .  Doppler:   There was no evidence for stenosis.     There was mild regurgitation. Peak gradient (D): 6 mm Hg.  ------------------------------------------------------------------- Left atrium:  The atrium was mildly dilated.  ------------------------------------------------------------------- Right ventricle:  The cavity size was normal.  Systolic function was normal.  ------------------------------------------------------------------- Pulmonic valve:    Structurally normal valve.   Cusp separation was normal.  Doppler:  Transvalvular velocity was within the normal range. There was trivial regurgitation.  ------------------------------------------------------------------- Tricuspid valve:   Doppler:  There was mild regurgitation.  ------------------------------------------------------------------- Right atrium:  The atrium was normal in size.  ------------------------------------------------------------------- Pericardium:  There was no pericardial effusion.  ------------------------------------------------------------------- Systemic veins:  IVC measured 2.2 cm with > 50% respirophasic variation, suggesting RA pressure 8 mmHg.  ------------------------------------------------------------------- Measurements   Left ventricle                           Value           Reference  LV ID, ED, PLAX chordal          (L)     34.6   mm       43 - 52  LV ID, ES, PLAX chordal                  23     mm       23 - 38  LV fx shortening, PLAX chordal           34     %        >=29  LV PW thickness, ED                      12.3   mm       ---------  IVS/LV PW ratio, ED              (H)     1.41            <=1.3  Stroke volume, 2D                        121    ml       ---------  Stroke volume/bsa, 2D                    60     ml/m^2   ---------  LV e&', lateral                           7.35   cm/s     ---------  LV E/e&', lateral                         16.6            ---------  LV e&', medial                            5.44   cm/s     ---------  LV E/e&', medial                          22.43           ---------  LV e&', average                           6.4      cm/s     ---------  LV E/e&', average                         19.08           ---------  LV ejection time                         340    ms       ---------     Ventricular septum                       Value           Reference  IVS thickness, ED                        17.4   mm       ---------    LVOT                                     Value           Reference  LVOT ID, S                               19.6   mm       ---------  LVOT area                                3.02   cm^2     ---------  LVOT ID                                  20     mm       ---------  LVOT peak velocity, S                    142    cm/s     ---------  LVOT mean velocity, S                    113    cm/s     ---------  LVOT VTI, S                              38.4   cm       ---------  LVOT peak gradient, S                    8      mm Hg    ---------  Stroke volume (SV), LVOT DP              115.9  ml       ---------  Stroke index (SV/bsa), LVOT DP           57.7   ml/m^2   ---------    Aortic valve                             Value           Reference  Aortic   valve peak velocity, S            421.26 cm/s     ---------  Aortic valve mean velocity, S            314.41 cm/s     ---------  Aortic valve VTI, S                      106.28 cm       ---------  Aortic mean gradient, S                  44     mm Hg    ---------  Aortic peak gradient, S                  71     mm Hg    ---------  VTI ratio, LVOT/AV                       0.36            ---------  Aortic valve area, VTI                   1.09   cm^2     ---------  Aortic valve area/bsa, VTI               0.54   cm^2/m^2 ---------  Velocity ratio, peak, LVOT/AV            0.34            ---------  Aortic valve area, peak velocity         1.03   cm^2     ---------  Aortic valve area/bsa, peak              0.51   cm^2/m^2 ---------  velocity  Velocity ratio, mean, LVOT/AV            0.36            ---------  Aortic valve area, mean velocity         1.08   cm^2     ---------  Aortic valve area/bsa, mean              0.54   cm^2/m^2 ---------  velocity  Aortic regurg peak velocity              158.91 cm/s      ---------  Aortic regurg pressure half-time         339    ms       ---------  Aortic regurg peak gradient              10     mm Hg    ---------    Aorta                                    Value           Reference  Aortic root ID, ED                       29     mm       ---------  Ascending aorta ID, A-Williamson, S               35     mm       ---------      Left atrium                              Value           Reference  LA ID, A-Williamson, ES                           44     mm       ---------  LA ID/bsa, A-Williamson                           2.19   cm/m^2   <=2.2  LA volume, S                             68     ml       ---------  LA volume/bsa, S                         33.9   ml/m^2   ---------  LA volume, ES, 1-Williamson A4C                   51     ml       ---------  LA volume/bsa, ES, 1-Williamson A4C               25.4   ml/m^2   ---------  LA volume, ES, 1-Williamson A2C                   79     ml       ---------  LA volume/bsa, ES, 1-Williamson A2C               39.3   ml/m^2   ---------    Mitral valve                             Value           Reference  Mitral E-wave peak velocity              122    cm/s     ---------  Mitral A-wave peak velocity              149    cm/s     ---------  Mitral deceleration time         (H)     356    ms       150 - 230  Mitral peak gradient, D                  6      mm Hg    ---------  Mitral E/A ratio, peak                   0.8             ---------    Pulmonary arteries                       Value           Reference  PA pressure, S, DP               (H)     53       mm Hg    <=30    Tricuspid valve                          Value           Reference  Tricuspid regurg peak velocity           335    cm/s     ---------  Tricuspid peak RV-RA gradient            45     mm Hg    ---------    Right ventricle                          Value           Reference  RV s&', lateral, S                        7.35   cm/s     ---------  Legend: (L)  and  (H)  mark values outside specified  reference range.  ------------------------------------------------------------------- Prepared and Electronically Authenticated by  Dalton McLean, M.D. 2019-05-22T14:57:03   Physicians   Panel Physicians Referring Physician Case Authorizing Physician  Smith, Henry W, MD (Primary)    Procedures   RIGHT/LEFT HEART CATH AND CORONARY ANGIOGRAPHY  Conclusion    Calcific aortic stenosis with peak to peak gradient of 32 mmHg.  Valve is heavily calcified by cine fluoroscopy.  Calculated aortic valve area 1.62 cm related to a calculated/estimated abnormally high cardiac output of 7.8 L/min  Technically difficult coronary angiography from the right radial due to heavy calcification in the iliac/subclavian preventing accurate catheter control.  Normal left main  Large widely patent LAD without obstructive disease  Large widely patent circumflex without obstructive disease  RCA never selectively engaged but visualized in the proximal mid and distal vessel is widely patent.  No evidence of pulmonary hypertension   RECOMMENDATIONS:   There is discordance between echo and hemodynamic data with reference to severity of aortic valve disease.  There is no other explanation for the patient's progressive dyspnea.  I would recommend referring the patient to the aortic valve clinic to determine if proceeding with valve therapy is reasonable.    Indications   Aortic stenosis, severe [I35.0 (ICD-10-CM)]  DOE (dyspnea on exertion) [R06.09 (ICD-10-CM)]  Chronic diastolic HF (heart failure), NYHA class 3 (HCC) [I50.32 (ICD-10-CM)]  Procedural Details/Technique   Technical Details The right radial area was sterilely prepped and draped. Intravenous sedation with Versed and fentanyl was administered. 1% Xylocaine was infiltrated to achieve local analgesia. Using real-time vascular ultrasound, a double wall stick with an angiocath was utilized to obtain intra-arterial access. A VUS image was  saved for the permanent record.The modified Seldinger technique was used to place a 5F " Slender" sheath in the right radial artery. Weight based heparin was administered. Coronary angiography was done using 5 F catheters. Right coronary angiography was performed with a JR4. Left ventricular hemodymic recordings and angiography was done using the JR 4 catheter and hand injection. Left coronary angiography was performed with a JL 3.5 cm.  Right heart catheterization was performed by exchanging a previously placed antecubital IV angio-cath for a 5 French Slender sheath. 1% Xylocaine was used to locally nesthetize the area around the IV site. The IV catheter was wired using an .018 guidewire. The modified Seldinger technique was used to place the 5 French   sheath. Double glove technique was used to enhance sterility. After sheath insertion, right heart cath was performed using a 5 French balloon tipped catheter and fluoroscopic guidance. Pressures were recorded in each chamber and in the pulmonary capillary wedge position.. The main pulmonary artery O2 saturation was sampled.   Hemostasis was achieved using a pneumatic band.  During this procedure the patient is administered a total of Versed 1 mg and Fentanyl 25 mg to achieve and maintain moderate conscious sedation. The patient's heart rate, blood pressure, and oxygen saturation are monitored continuously during the procedure. The period of conscious sedation is 52 minutes, of which I was present face-to-face 100% of this time.   Estimated blood loss <50 mL.  During this procedure the patient was administered the following to achieve and maintain moderate conscious sedation: Versed 1 mg, Fentanyl 25 mcg, while the patient's heart rate, blood pressure, and oxygen saturation were continuously monitored. The period of conscious sedation was 52 minutes, of which I was present face-to-face 100% of this time.  Coronary Findings   Diagnostic  Dominance: Right   No diagnostic findings have been documented.  Intervention   No interventions have been documented.  Right Heart   Right Heart Pressures Hemodynamic findings consistent with pulmonary hypertension. LV EDP is normal.  Left Heart   Left Ventricle LV end diastolic pressure is normal.  Coronary Diagrams   Diagnostic Diagram       Implants    No implant documentation for this case.  MERGE Images   Show images for CARDIAC CATHETERIZATION   Link to Procedure Log   Procedure Log    Hemo Data    Most Recent Value  Fick Cardiac Output 7.81 L/min  Fick Cardiac Output Index 4.03 (L/min)/BSA  Aortic Mean Gradient 22.1 mmHg  Aortic Peak Gradient 32 mmHg  Aortic Valve Area 1.62  Aortic Value Area Index 0.83 cm2/BSA  RA A Wave 7 mmHg  RA V Wave 1 mmHg  RA Mean 1 mmHg  RV Systolic Pressure 36 mmHg  RV Diastolic Pressure 3 mmHg  RV EDP 7 mmHg  PA Systolic Pressure 31 mmHg  PA Diastolic Pressure 16 mmHg  PA Mean 23 mmHg  PW A Wave 11 mmHg  PW V Wave 8 mmHg  PW Mean 9 mmHg  AO Systolic Pressure 139 mmHg  AO Diastolic Pressure 65 mmHg  AO Mean 95 mmHg  LV Systolic Pressure 175 mmHg  LV Diastolic Pressure 7 mmHg  LV EDP 12 mmHg  Arterial Occlusion Pressure Extended Systolic Pressure 140 mmHg  Arterial Occlusion Pressure Extended Diastolic Pressure 65 mmHg  Arterial Occlusion Pressure Extended Mean Pressure 96 mmHg  Left Ventricular Apex Extended Systolic Pressure 172 mmHg  Left Ventricular Apex Extended Diastolic Pressure 6 mmHg  Left Ventricular Apex Extended EDP Pressure 10 mmHg  QP/QS 1  TPVR Index 5.72 HRUI  TSVR Index 23.61 HRUI  PVR SVR Ratio 0.15  TPVR/TSVR Ratio 0.24    ADDENDUM REPORT: 04/10/2018 12:08  CLINICAL DATA:  77-year-old female with severe aortic stenosis being evaluated for a TAVR procedure.  EXAM: Cardiac TAVR CT  TECHNIQUE: The patient was scanned on a Phillips Force scanner. A 120 kV retrospective scan was triggered in the descending  thoracic aorta at 111 HU's. Gantry rotation speed was 250 msecs and collimation was .6 mm. No beta blockade or nitro were given. The 3D data set was reconstructed in 5% intervals of the R-R cycle. Systolic and diastolic phases were analyzed on a dedicated work station using   MPR, MIP and VRT modes. The patient received 80 cc of contrast.  FINDINGS: Aortic Valve: Trileaflet aortic valve with severely thickened and calcified leaflets and severely restricted leaflets opening, calculated AVA 1.3 cm2. Only trivial calcifications are extending into the LVOT.  Aorta: Normal size, mild diffuse atheroma and calcifications.  Sinotubular Junction: 30 x 29 mm  Ascending Thoracic Aorta: 37 x 35 mm  Aortic Arch: 28 x 24 mm  Descending Thoracic Aorta: 25 x 23 mm  Sinus of Valsalva Measurements:  Non-coronary: 27 mm  Right -coronary: 30 mm  Left -coronary: 28 mm  Sinus of Valsalva Height:  Right -coronary: 22 mm  Left -coronary: 18 mm  Coronary Artery Height above Annulus:  Left Main: 11 mm  Right Coronary: 15 mm  Virtual Basal Annulus Measurements:  Maximum/Minimum Diameter: 23.8 x 21.0 mm  Mean Diameter: 21.5 mm  Perimeter: 69.3 mm  Area:  362 mm2  Optimum Fluoroscopic Angle for Delivery: RAO 5 CRA 5  IMPRESSION: 1. Trileaflet aortic valve with severely thickened and calcified leaflets and severely restricted leaflets opening, calculated AVA 1.3 cm 2. Only trivial calcifications are extending into the LVOT. Annular measurements suitable for delivery of a 23 mm Edwards-SAPIEN 3 valve or a 26 mm Evolut R Medtronic valve.  2. Sufficient coronary to annulus distance.  3. Optimum Fluoroscopic Angle for Delivery:  RAO 5 CRA 5  4. No thrombus in the left atrial appendage.  5. Dilated pulmonary artery measuring 34 mm suggestive of pulmonary hypertension.   Electronically Signed   By: Katarina  Nelson   On: 04/10/2018 12:08   Addended  by Nelson, Katarina H, MD on 04/10/2018 12:11 PM    Study Result   EXAM: OVER-READ INTERPRETATION  CT CHEST  The following report is an over-read performed by radiologist Dr. Daniel Entrikin of [facility] on 04/09/2018. This over-read does not include interpretation of cardiac or coronary anatomy or pathology. The coronary calcium score/coronary CTA interpretation by the cardiologist is attached.  COMPARISON:  Chest CT 10/29/2009.  FINDINGS: Extracardiac findings will be described separately under dictation for contemporaneously obtained CTA chest, abdomen pelvis.  IMPRESSION: Please see separate dictation for contemporaneously obtained CTA chest, abdomen and pelvis dated 04/09/2018 for full description of relevant extracardiac findings.  Electronically Signed: By: Daniel  Entrikin M.D. On: 04/09/2018 13:50       CLINICAL DATA:  77-year-old female with history of severe aortic valve stenosis. Preprocedural study prior to potential transcatheter aortic valve replacement (TAVR) procedure.  EXAM: CT ANGIOGRAPHY CHEST, ABDOMEN AND PELVIS  TECHNIQUE: Multidetector CT imaging through the chest, abdomen and pelvis was performed using the standard protocol during bolus administration of intravenous contrast. Multiplanar reconstructed images and MIPs were obtained and reviewed to evaluate the vascular anatomy.  CONTRAST:  95mL ISOVUE-370 IOPAMIDOL (ISOVUE-370) INJECTION 76%  COMPARISON:  None.  FINDINGS: CTA CHEST FINDINGS  Cardiovascular: Mild cardiomegaly. There is no significant pericardial fluid, thickening or pericardial calcification. Aortic atherosclerosis. No definite coronary artery calcifications. Severe thickening calcification of the aortic valve. Mild calcification of the mitral annulus.  Mediastinum/Lymph Nodes: No pathologically enlarged mediastinal or hilar lymph nodes. Esophagus is unremarkable in appearance. Heterogeneous  appearance of the thyroid gland, presumably reflective of a multinodular goiter. No axillary lymphadenopathy.  Lungs/Pleura: Patchy multifocal ground-glass attenuation in the lungs bilaterally, favored to reflect a background of very mild interstitial pulmonary edema. No consolidative airspace disease. No pleural effusions. No suspicious appearing pulmonary nodules or masses.  Musculoskeletal/Soft Tissues: There are no aggressive appearing lytic   or blastic lesions noted in the visualized portions of the skeleton.  CTA ABDOMEN AND PELVIS FINDINGS  Hepatobiliary: No suspicious cystic or solid hepatic lesions. No intra or extrahepatic biliary ductal dilatation. Gallbladder is normal in appearance.  Pancreas: No pancreatic mass. No pancreatic ductal dilatation. No pancreatic or peripancreatic fluid or inflammatory changes.  Spleen: Unremarkable.  Adrenals/Urinary Tract: Mild multifocal cortical thinning in the kidneys bilaterally. No suspicious renal lesions. No hydroureteronephrosis. Bilateral adrenal glands are normal in appearance. Urinary bladder is normal in appearance.  Stomach/Bowel: Normal appearance of the stomach. No pathologic dilatation of small bowel or colon. Normal appendix.  Vascular/Lymphatic: Aortic atherosclerosis, without evidence of aneurysm or dissection in the abdominal or pelvic vasculature. Vascular findings and measurements pertinent to potential TAVR procedure, as detailed below. Celiac axis, superior mesenteric artery and inferior mesenteric artery are all widely patent without hemodynamically significant stenosis. Single renal arteries bilaterally are widely patent without hemodynamically significant stenosis. No lymphadenopathy noted in the abdomen or pelvis.  Reproductive: Status post hysterectomy. Ovaries are not confidently identified may be surgically absent or atrophic.  Other: Trace volume of ascites in the low anatomic pelvis.  No pneumoperitoneum.  Musculoskeletal: There are no aggressive appearing lytic or blastic lesions noted in the visualized portions of the skeleton.  VASCULAR MEASUREMENTS PERTINENT TO TAVR:  AORTA:  Minimal Aortic Diameter-15 x 15 mm  Severity of Aortic Calcification-mild  RIGHT PELVIS:  Right Common Iliac Artery -  Minimal Diameter-10.2 x 9.8 mm  Tortuosity-mild  Calcification-mild  Right External Iliac Artery -  Minimal Diameter-8.1 x 7.3 mm  Tortuosity-mild  Calcification-none  Right Common Femoral Artery -  Minimal Diameter-7.6 x 7.9 mm  Tortuosity-mild  Calcification - none  LEFT PELVIS:  Left Common Iliac Artery -  Minimal Diameter-10.1 x 9.6 mm  Tortuosity-mild  Calcification - none  Left External Iliac Artery -  Minimal Diameter-6.6 x 6.9 mm  Tortuosity-mild  Calcification - none  Left Common Femoral Artery -  Minimal Diameter-7.1 x 7.3 mm  Tortuosity-mild  Calcification - none  Review of the MIP images confirms the above findings.  IMPRESSION: 1. Aortic atherosclerosis with vascular findings and measurements pertinent to potential TAVR procedure, as detailed above. 2. Severe thickening calcification of the aortic valve, compatible with the reported clinical history of severe aortic stenosis. 3. Cardiomegaly with what appears to be mild interstitial pulmonary edema, which may suggest mild congestive heart failure. 4. Additional incidental findings, as above.   Electronically Signed   By: Daniel  Entrikin M.D.   On: 04/09/2018 16:52   Impression:  This 77-year-old woman has stage D, severe, symptomatic aortic stenosis with New York Heart Association class III symptoms of exertional fatigue and shortness of breath consistent with chronic diastolic congestive heart failure.  I have personally reviewed her 2D echocardiogram, TEE, and cardiac catheterization.  Her 2D echocardiogram shows a  trileaflet severely calcified aortic valve with a mean gradient of 47 mmHg consistent with severe aortic stenosis.  Left ventricular ejection fraction is 55 to 60%.  TEE does not show any mass on the mitral valve.  Cardiac catheterization shows no significant coronary disease.  I agree that aortic valve replacement is the best treatment for this patient to improve her symptoms and quality of life and to prevent progressive left ventricular deterioration.  I think she would be at moderate risk for open surgical aortic valve replacement and therefore transcatheter aortic valve replacement would be a reasonable alternative for her.  Her gated cardiac CTA shows anatomy that is   favorable for TAVR using a Sapien 3 valve with no complicating features.  Her abdominal and pelvic CTA shows adequate pelvic arterial anatomy to allow transfemoral insertion.  The patient was counseled at length regarding treatment alternatives for management of severe symptomatic aortic stenosis. The risks and benefits of surgical intervention has been discussed in detail. Long-term prognosis with medical therapy was discussed. Alternative approaches such as conventional surgical aortic valve replacement, transcatheter aortic valve replacement, and palliative medical therapy were compared and contrasted at length. This discussion was placed in the context of the patient's own specific clinical presentation and past medical history. All of her questions have been addressed.   Following the decision to proceed with transcatheter aortic valve replacement, a discussion was held regarding what types of management strategies would be attempted intraoperatively in the event of life-threatening complications, including whether or not the patient would be considered a candidate for the use of cardiopulmonary bypass and/or conversion to open sternotomy for attempted surgical intervention.    The patient has been advised of a variety of complications  that might develop including but not limited to risks of death, stroke, paravalvular leak, aortic dissection or other major vascular complications, aortic annulus rupture, device embolization, cardiac rupture or perforation, mitral regurgitation, acute myocardial infarction, arrhythmia, heart block or bradycardia requiring permanent pacemaker placement, congestive heart failure, respiratory failure, renal failure, pneumonia, infection, other late complications related to structural valve deterioration or migration, or other complications that might ultimately cause a temporary or permanent loss of functional independence or other long term morbidity. The patient provides full informed consent for the procedure as described and all questions were answered.     Plan:  Transfemoral transcatheter aortic valve replacement will be scheduled for Tuesday, 04/15/2018.  She has been instructed to hold her Xarelto 4 days preoperatively and to hold the metformin 48 hours preoperatively.  I spent 60 minutes performing this consultation and > 50% of this time was spent face to face counseling and coordinating the care of this patient's severe symptomatic aortic stenosis.    Bryan K. Bartle, MD 04/09/2018   

## 2018-04-11 ENCOUNTER — Other Ambulatory Visit: Payer: Self-pay

## 2018-04-11 ENCOUNTER — Encounter (HOSPITAL_COMMUNITY): Payer: Self-pay

## 2018-04-11 ENCOUNTER — Encounter (HOSPITAL_COMMUNITY)
Admission: RE | Admit: 2018-04-11 | Discharge: 2018-04-11 | Disposition: A | Discharge status: Home / Self Care | Payer: Medicare Other | Source: Ambulatory Visit | Attending: Cardiovascular Disease | Admitting: Cardiovascular Disease

## 2018-04-11 ENCOUNTER — Ambulatory Visit (HOSPITAL_COMMUNITY)
Admission: RE | Admit: 2018-04-11 | Discharge: 2018-04-11 | Disposition: A | Discharge status: Home / Self Care | Payer: Medicare Other | Source: Ambulatory Visit | Attending: Cardiovascular Disease | Admitting: Cardiovascular Disease

## 2018-04-11 DIAGNOSIS — J9811 Atelectasis: Secondary | ICD-10-CM | POA: Insufficient documentation

## 2018-04-11 DIAGNOSIS — Z01810 Encounter for preprocedural cardiovascular examination: Secondary | ICD-10-CM | POA: Insufficient documentation

## 2018-04-11 DIAGNOSIS — I350 Nonrheumatic aortic (valve) stenosis: Secondary | ICD-10-CM | POA: Insufficient documentation

## 2018-04-11 DIAGNOSIS — R9431 Abnormal electrocardiogram [ECG] [EKG]: Secondary | ICD-10-CM | POA: Insufficient documentation

## 2018-04-11 DIAGNOSIS — I447 Left bundle-branch block, unspecified: Secondary | ICD-10-CM | POA: Insufficient documentation

## 2018-04-11 DIAGNOSIS — Z01812 Encounter for preprocedural laboratory examination: Secondary | ICD-10-CM | POA: Diagnosis present

## 2018-04-11 DIAGNOSIS — Z01818 Encounter for other preprocedural examination: Secondary | ICD-10-CM | POA: Diagnosis not present

## 2018-04-11 DIAGNOSIS — I517 Cardiomegaly: Secondary | ICD-10-CM | POA: Insufficient documentation

## 2018-04-11 HISTORY — DX: Hypothyroidism, unspecified: E03.9

## 2018-04-11 LAB — URINALYSIS, ROUTINE W REFLEX MICROSCOPIC
BILIRUBIN URINE: NEGATIVE
GLUCOSE, UA: NEGATIVE mg/dL
Hgb urine dipstick: NEGATIVE
KETONES UR: NEGATIVE mg/dL
Nitrite: NEGATIVE
PH: 5 (ref 5.0–8.0)
PROTEIN: NEGATIVE mg/dL
Specific Gravity, Urine: 1.016 (ref 1.005–1.030)

## 2018-04-11 LAB — CBC
HEMATOCRIT: 33.7 % — AB (ref 36.0–46.0)
Hemoglobin: 9.5 g/dL — ABNORMAL LOW (ref 12.0–15.0)
MCH: 20.4 pg — ABNORMAL LOW (ref 26.0–34.0)
MCHC: 28.2 g/dL — ABNORMAL LOW (ref 30.0–36.0)
MCV: 72.3 fL — ABNORMAL LOW (ref 78.0–100.0)
PLATELETS: 469 10*3/uL — AB (ref 150–400)
RBC: 4.66 MIL/uL (ref 3.87–5.11)
RDW: 19.4 % — AB (ref 11.5–15.5)
WBC: 8.8 10*3/uL (ref 4.0–10.5)

## 2018-04-11 LAB — BLOOD GAS, ARTERIAL
Acid-Base Excess: 3.6 mmol/L — ABNORMAL HIGH (ref 0.0–2.0)
Bicarbonate: 27 mmol/L (ref 20.0–28.0)
DRAWN BY: 449841
FIO2: 21
O2 SAT: 91.2 %
PATIENT TEMPERATURE: 98.6
PH ART: 7.482 — AB (ref 7.350–7.450)
pCO2 arterial: 36.5 mmHg (ref 32.0–48.0)
pO2, Arterial: 57.9 mmHg — ABNORMAL LOW (ref 83.0–108.0)

## 2018-04-11 LAB — COMPREHENSIVE METABOLIC PANEL
ALBUMIN: 3.5 g/dL (ref 3.5–5.0)
ALT: 8 U/L (ref 0–44)
AST: 14 U/L — AB (ref 15–41)
Alkaline Phosphatase: 57 U/L (ref 38–126)
Anion gap: 10 (ref 5–15)
BUN: 13 mg/dL (ref 8–23)
CHLORIDE: 101 mmol/L (ref 98–111)
CO2: 27 mmol/L (ref 22–32)
Calcium: 9.7 mg/dL (ref 8.9–10.3)
Creatinine, Ser: 0.78 mg/dL (ref 0.44–1.00)
GFR calc Af Amer: >60 mL/min (ref >60)
GFR calc non Af Amer: >60 mL/min (ref >60)
GLUCOSE: 112 mg/dL — AB (ref 70–99)
POTASSIUM: 3.5 mmol/L (ref 3.5–5.1)
SODIUM: 138 mmol/L (ref 135–145)
Total Bilirubin: 0.3 mg/dL (ref 0.3–1.2)
Total Protein: 7.7 g/dL (ref 6.5–8.1)

## 2018-04-11 LAB — APTT: APTT: 49 s — AB (ref 24–36)

## 2018-04-11 LAB — BRAIN NATRIURETIC PEPTIDE: B NATRIURETIC PEPTIDE 5: 74.6 pg/mL (ref 0.0–100.0)

## 2018-04-11 LAB — PROTIME-INR
INR: 1.88
Prothrombin Time: 21.5 seconds — ABNORMAL HIGH (ref 11.4–15.2)

## 2018-04-11 LAB — SURGICAL PCR SCREEN
MRSA, PCR: NEGATIVE
Staphylococcus aureus: NEGATIVE

## 2018-04-11 LAB — GLUCOSE, CAPILLARY: Glucose-Capillary: 144 mg/dL — ABNORMAL HIGH (ref 70–99)

## 2018-04-11 LAB — HEMOGLOBIN A1C
Hgb A1c MFr Bld: 6.5 % — ABNORMAL HIGH (ref 4.8–5.6)
Mean Plasma Glucose: 139.85 mg/dL

## 2018-04-11 NOTE — Progress Notes (Signed)
Pt has hx of A-fib, cardiomyopathy and Aortic stenosis. Cardiologists are Dr. Anne Fu and Dr. Excell Seltzer. Pt denies any chest pain or sob. Pt is a type 2 diabetic. Pt not sure when last A1C was. Pt states her fasting blood sugar is usually between 117-120. Pt has been instructed to stop Metformin and Xarelto and her last dose of both was yesterday, 04/10/18.

## 2018-04-11 NOTE — Progress Notes (Signed)
Notified Katie, RN at Dr. Earmon [place] office of abnormal UA.

## 2018-04-14 ENCOUNTER — Other Ambulatory Visit: Payer: Self-pay

## 2018-04-14 ENCOUNTER — Telehealth: Payer: Self-pay | Admitting: Cardiovascular Disease

## 2018-04-14 DIAGNOSIS — I350 Nonrheumatic aortic (valve) stenosis: Secondary | ICD-10-CM

## 2018-04-14 MED ORDER — SODIUM CHLORIDE 0.9 % IV SOLN
30.0000 ug/min | INTRAVENOUS | Status: DC
  Filled 2018-04-14: qty 2

## 2018-04-14 MED ORDER — MAGNESIUM SULFATE 50 % IJ SOLN
40.0000 meq | INTRAMUSCULAR | Status: DC
  Filled 2018-04-14: qty 9.85

## 2018-04-14 MED ORDER — SODIUM CHLORIDE 0.9 % IV SOLN
INTRAVENOUS | Status: DC
  Filled 2018-04-14: qty 1

## 2018-04-14 MED ORDER — NITROGLYCERIN IN D5W 200-5 MCG/ML-% IV SOLN
2.0000 ug/min | INTRAVENOUS | Status: DC
  Filled 2018-04-14: qty 250

## 2018-04-14 MED ORDER — NOREPINEPHRINE 4 MG/250ML-% IV SOLN
0.0000 ug/min | INTRAVENOUS | Status: AC
  Administered 2018-04-15: 4 ug/min via INTRAVENOUS
  Filled 2018-04-14: qty 250

## 2018-04-14 MED ORDER — LEVOFLOXACIN IN D5W 500 MG/100ML IV SOLN
500.0000 mg | INTRAVENOUS | Status: AC
  Administered 2018-04-15: 500 mg via INTRAVENOUS
  Filled 2018-04-14: qty 100

## 2018-04-14 MED ORDER — DOPAMINE-DEXTROSE 3.2-5 MG/ML-% IV SOLN
0.0000 ug/kg/min | INTRAVENOUS | Status: DC
  Filled 2018-04-14: qty 250

## 2018-04-14 MED ORDER — DEXMEDETOMIDINE HCL IN NACL 400 MCG/100ML IV SOLN
0.1000 ug/kg/h | INTRAVENOUS | Status: AC
  Administered 2018-04-15: 1.2 ug/kg/h via INTRAVENOUS
  Filled 2018-04-14: qty 100

## 2018-04-14 MED ORDER — SODIUM CHLORIDE 0.9 % IV SOLN
INTRAVENOUS | Status: DC
  Filled 2018-04-14: qty 30

## 2018-04-14 MED ORDER — VANCOMYCIN HCL 10 G IV SOLR
1250.0000 mg | INTRAVENOUS | Status: AC
  Administered 2018-04-15: 1250 mg via INTRAVENOUS
  Filled 2018-04-14: qty 1250

## 2018-04-14 MED ORDER — EPINEPHRINE PF 1 MG/ML IJ SOLN
0.0000 ug/min | INTRAVENOUS | Status: DC
  Filled 2018-04-14: qty 4

## 2018-04-14 MED ORDER — POTASSIUM CHLORIDE 2 MEQ/ML IV SOLN
80.0000 meq | INTRAVENOUS | Status: DC
  Filled 2018-04-14: qty 40

## 2018-04-14 NOTE — Telephone Encounter (Signed)
New Message  Victorino Dike with the Blood bank is calling with questions about the pt's surgery for tomorrow

## 2018-04-14 NOTE — Telephone Encounter (Signed)
Gave caller number for TAVR nurse to address blood bank concerns. She was grateful for assistance.

## 2018-04-15 ENCOUNTER — Inpatient Hospital Stay (HOSPITAL_COMMUNITY): Payer: Medicare Other

## 2018-04-15 ENCOUNTER — Other Ambulatory Visit: Payer: Self-pay

## 2018-04-15 ENCOUNTER — Encounter (HOSPITAL_COMMUNITY)
Admission: RE | Disposition: A | Discharge status: Home / Self Care | Payer: Self-pay | Source: Ambulatory Visit | Attending: Cardiovascular Disease

## 2018-04-15 ENCOUNTER — Inpatient Hospital Stay (HOSPITAL_COMMUNITY): Payer: Medicare Other | Admitting: Anesthesiology

## 2018-04-15 ENCOUNTER — Inpatient Hospital Stay (HOSPITAL_COMMUNITY)
Admission: RE | Admit: 2018-04-15 | Discharge: 2018-04-16 | DRG: 267 | Disposition: A | Discharge status: Home / Self Care | Payer: Medicare Other | Source: Ambulatory Visit | Attending: Cardiovascular Disease | Admitting: Cardiovascular Disease

## 2018-04-15 ENCOUNTER — Encounter (HOSPITAL_COMMUNITY): Payer: Self-pay

## 2018-04-15 DIAGNOSIS — I480 Paroxysmal atrial fibrillation: Secondary | ICD-10-CM | POA: Diagnosis not present

## 2018-04-15 DIAGNOSIS — Z006 Encounter for examination for normal comparison and control in clinical research program: Secondary | ICD-10-CM

## 2018-04-15 DIAGNOSIS — R0609 Other forms of dyspnea: Secondary | ICD-10-CM | POA: Diagnosis not present

## 2018-04-15 DIAGNOSIS — E7800 Pure hypercholesterolemia, unspecified: Secondary | ICD-10-CM | POA: Diagnosis not present

## 2018-04-15 DIAGNOSIS — E119 Type 2 diabetes mellitus without complications: Secondary | ICD-10-CM

## 2018-04-15 DIAGNOSIS — Z96651 Presence of right artificial knee joint: Secondary | ICD-10-CM | POA: Diagnosis present

## 2018-04-15 DIAGNOSIS — I110 Hypertensive heart disease with heart failure: Secondary | ICD-10-CM | POA: Diagnosis not present

## 2018-04-15 DIAGNOSIS — K219 Gastro-esophageal reflux disease without esophagitis: Secondary | ICD-10-CM | POA: Diagnosis not present

## 2018-04-15 DIAGNOSIS — Z881 Allergy status to other antibiotic agents status: Secondary | ICD-10-CM | POA: Diagnosis not present

## 2018-04-15 DIAGNOSIS — I700 Atherosclerosis of aorta: Secondary | ICD-10-CM | POA: Diagnosis present

## 2018-04-15 DIAGNOSIS — I447 Left bundle-branch block, unspecified: Secondary | ICD-10-CM | POA: Diagnosis present

## 2018-04-15 DIAGNOSIS — Z823 Family history of stroke: Secondary | ICD-10-CM | POA: Diagnosis not present

## 2018-04-15 DIAGNOSIS — I5032 Chronic diastolic (congestive) heart failure: Secondary | ICD-10-CM | POA: Diagnosis not present

## 2018-04-15 DIAGNOSIS — I10 Essential (primary) hypertension: Secondary | ICD-10-CM | POA: Diagnosis present

## 2018-04-15 DIAGNOSIS — Z8249 Family history of ischemic heart disease and other diseases of the circulatory system: Secondary | ICD-10-CM | POA: Diagnosis not present

## 2018-04-15 DIAGNOSIS — Z90710 Acquired absence of both cervix and uterus: Secondary | ICD-10-CM

## 2018-04-15 DIAGNOSIS — Z9841 Cataract extraction status, right eye: Secondary | ICD-10-CM | POA: Diagnosis not present

## 2018-04-15 DIAGNOSIS — Z7901 Long term (current) use of anticoagulants: Secondary | ICD-10-CM | POA: Diagnosis not present

## 2018-04-15 DIAGNOSIS — Z9842 Cataract extraction status, left eye: Secondary | ICD-10-CM

## 2018-04-15 DIAGNOSIS — E785 Hyperlipidemia, unspecified: Secondary | ICD-10-CM | POA: Diagnosis not present

## 2018-04-15 DIAGNOSIS — Z8672 Personal history of thrombophlebitis: Secondary | ICD-10-CM | POA: Diagnosis not present

## 2018-04-15 DIAGNOSIS — I350 Nonrheumatic aortic (valve) stenosis: Principal | ICD-10-CM

## 2018-04-15 DIAGNOSIS — I428 Other cardiomyopathies: Secondary | ICD-10-CM | POA: Diagnosis not present

## 2018-04-15 DIAGNOSIS — I340 Nonrheumatic mitral (valve) insufficiency: Secondary | ICD-10-CM | POA: Diagnosis not present

## 2018-04-15 DIAGNOSIS — Z7984 Long term (current) use of oral hypoglycemic drugs: Secondary | ICD-10-CM

## 2018-04-15 DIAGNOSIS — Z952 Presence of prosthetic heart valve: Secondary | ICD-10-CM | POA: Diagnosis not present

## 2018-04-15 DIAGNOSIS — Z8673 Personal history of transient ischemic attack (TIA), and cerebral infarction without residual deficits: Secondary | ICD-10-CM

## 2018-04-15 DIAGNOSIS — D649 Anemia, unspecified: Secondary | ICD-10-CM | POA: Diagnosis present

## 2018-04-15 DIAGNOSIS — I5030 Unspecified diastolic (congestive) heart failure: Secondary | ICD-10-CM | POA: Diagnosis not present

## 2018-04-15 DIAGNOSIS — Z79899 Other long term (current) drug therapy: Secondary | ICD-10-CM

## 2018-04-15 DIAGNOSIS — Z888 Allergy status to other drugs, medicaments and biological substances status: Secondary | ICD-10-CM

## 2018-04-15 HISTORY — PX: TEE WITHOUT CARDIOVERSION: SHX5443

## 2018-04-15 HISTORY — DX: Presence of prosthetic heart valve: Z95.2

## 2018-04-15 HISTORY — PX: TRANSCATHETER AORTIC VALVE REPLACEMENT, TRANSFEMORAL: SHX6400

## 2018-04-15 HISTORY — DX: Hyperlipidemia, unspecified: E78.5

## 2018-04-15 LAB — POCT I-STAT, CHEM 8
BUN: 12 mg/dL (ref 8–23)
BUN: 11 mg/dL (ref 8–23)
BUN: 12 mg/dL (ref 8–23)
BUN: 8 mg/dL (ref 8–23)
BUN: 8 mg/dL (ref 8–23)
BUN: 7 mg/dL — ABNORMAL LOW (ref 8–23)
CALCIUM ION: 1.31 mmol/L (ref 1.15–1.40)
CALCIUM ION: 1.27 mmol/L (ref 1.15–1.40)
CALCIUM ION: 1.27 mmol/L (ref 1.15–1.40)
CHLORIDE: 101 mmol/L (ref 98–111)
CHLORIDE: 102 mmol/L (ref 98–111)
CREATININE: 0.6 mg/dL (ref 0.44–1.00)
CREATININE: 0.6 mg/dL (ref 0.44–1.00)
CREATININE: 0.7 mg/dL (ref 0.44–1.00)
CREATININE: 0.6 mg/dL (ref 0.44–1.00)
CREATININE: 0.6 mg/dL (ref 0.44–1.00)
CREATININE: 0.6 mg/dL (ref 0.44–1.00)
Calcium, Ion: 1.16 mmol/L (ref 1.15–1.40)
Calcium, Ion: 1.11 mmol/L — ABNORMAL LOW (ref 1.15–1.40)
Calcium, Ion: 1.14 mmol/L — ABNORMAL LOW (ref 1.15–1.40)
Chloride: 101 mmol/L (ref 98–111)
Chloride: 101 mmol/L (ref 98–111)
Chloride: 100 mmol/L (ref 98–111)
Chloride: 100 mmol/L (ref 98–111)
GLUCOSE: 128 mg/dL — AB (ref 70–99)
GLUCOSE: 176 mg/dL — AB (ref 70–99)
GLUCOSE: 137 mg/dL — AB (ref 70–99)
GLUCOSE: 148 mg/dL — AB (ref 70–99)
Glucose, Bld: 202 mg/dL — ABNORMAL HIGH (ref 70–99)
Glucose, Bld: 180 mg/dL — ABNORMAL HIGH (ref 70–99)
HCT: 28 % — ABNORMAL LOW (ref 36.0–46.0)
HCT: 27 % — ABNORMAL LOW (ref 36.0–46.0)
HCT: 29 % — ABNORMAL LOW (ref 36.0–46.0)
HCT: 25 % — ABNORMAL LOW (ref 36.0–46.0)
HEMATOCRIT: 29 % — AB (ref 36.0–46.0)
HEMATOCRIT: 24 % — AB (ref 36.0–46.0)
HEMOGLOBIN: 9.2 g/dL — AB (ref 12.0–15.0)
HEMOGLOBIN: 8.5 g/dL — AB (ref 12.0–15.0)
HEMOGLOBIN: 8.2 g/dL — AB (ref 12.0–15.0)
Hemoglobin: 9.5 g/dL — ABNORMAL LOW (ref 12.0–15.0)
Hemoglobin: 9.9 g/dL — ABNORMAL LOW (ref 12.0–15.0)
Hemoglobin: 9.9 g/dL — ABNORMAL LOW (ref 12.0–15.0)
POTASSIUM: 3.5 mmol/L (ref 3.5–5.1)
POTASSIUM: 4 mmol/L (ref 3.5–5.1)
Potassium: 3.5 mmol/L (ref 3.5–5.1)
Potassium: 3.2 mmol/L — ABNORMAL LOW (ref 3.5–5.1)
Potassium: 4.2 mmol/L (ref 3.5–5.1)
Potassium: 3.9 mmol/L (ref 3.5–5.1)
SODIUM: 139 mmol/L (ref 135–145)
Sodium: 142 mmol/L (ref 135–145)
Sodium: 142 mmol/L (ref 135–145)
Sodium: 141 mmol/L (ref 135–145)
Sodium: 140 mmol/L (ref 135–145)
Sodium: 140 mmol/L (ref 135–145)
TCO2: 29 mmol/L (ref 22–32)
TCO2: 29 mmol/L (ref 22–32)
TCO2: 26 mmol/L (ref 22–32)
TCO2: 31 mmol/L (ref 22–32)
TCO2: 30 mmol/L (ref 22–32)
TCO2: 29 mmol/L (ref 22–32)

## 2018-04-15 LAB — POCT ACTIVATED CLOTTING TIME
Activated Clotting Time: 147 seconds
Activated Clotting Time: 131 seconds
Activated Clotting Time: 296 seconds

## 2018-04-15 LAB — PROTIME-INR
INR: 1.14
Prothrombin Time: 14.6 seconds (ref 11.4–15.2)

## 2018-04-15 LAB — POCT I-STAT 3, ART BLOOD GAS (G3+)
ACID-BASE EXCESS: 5 mmol/L — AB (ref 0.0–2.0)
Bicarbonate: 30.6 mmol/L — ABNORMAL HIGH (ref 20.0–28.0)
O2 SAT: 100 %
TCO2: 32 mmol/L (ref 22–32)
pCO2 arterial: 50.6 mmHg — ABNORMAL HIGH (ref 32.0–48.0)
pH, Arterial: 7.389 (ref 7.350–7.450)
pO2, Arterial: 216 mmHg — ABNORMAL HIGH (ref 83.0–108.0)

## 2018-04-15 LAB — TYPE AND SCREEN
ABO/RH(D): O POS
ANTIBODY SCREEN: POSITIVE

## 2018-04-15 LAB — GLUCOSE, CAPILLARY
GLUCOSE-CAPILLARY: 125 mg/dL — AB (ref 70–99)
GLUCOSE-CAPILLARY: 105 mg/dL — AB (ref 70–99)
GLUCOSE-CAPILLARY: 165 mg/dL — AB (ref 70–99)
Glucose-Capillary: 107 mg/dL — ABNORMAL HIGH (ref 70–99)
Glucose-Capillary: 109 mg/dL — ABNORMAL HIGH (ref 70–99)

## 2018-04-15 SURGERY — IMPLANTATION, AORTIC VALVE, TRANSCATHETER, FEMORAL APPROACH
Anesthesia: Monitor Anesthesia Care | Site: Groin

## 2018-04-15 MED ORDER — MORPHINE SULFATE (PF) 2 MG/ML IV SOLN: 2.0000 mg | INTRAVENOUS | Status: DC | PRN

## 2018-04-15 MED ORDER — CHLORHEXIDINE GLUCONATE 0.12 % MT SOLN
15.0000 mL | Freq: Once | OROMUCOSAL | Status: AC
  Administered 2018-04-15: 15 mL via OROMUCOSAL
  Filled 2018-04-15: qty 15

## 2018-04-15 MED ORDER — ACETAMINOPHEN 325 MG PO TABS: 650.0000 mg | ORAL_TABLET | Freq: Four times a day (QID) | ORAL | Status: DC | PRN

## 2018-04-15 MED ORDER — SODIUM CHLORIDE 0.9% FLUSH
3.0000 mL | Freq: Two times a day (BID) | INTRAVENOUS | Status: DC
  Administered 2018-04-15 – 2018-04-16 (×2): 3 mL via INTRAVENOUS

## 2018-04-15 MED ORDER — LACTATED RINGERS IV SOLN
INTRAVENOUS | Status: DC | PRN
  Administered 2018-04-15: 11:00:00 via INTRAVENOUS

## 2018-04-15 MED ORDER — HEPARIN SODIUM (PORCINE) 1000 UNIT/ML IJ SOLN
INTRAMUSCULAR | Status: AC
  Filled 2018-04-15: qty 2

## 2018-04-15 MED ORDER — ROCURONIUM BROMIDE 10 MG/ML (PF) SYRINGE
PREFILLED_SYRINGE | INTRAVENOUS | Status: AC
  Filled 2018-04-15: qty 10

## 2018-04-15 MED ORDER — ONDANSETRON HCL 4 MG/2ML IJ SOLN: 4.0000 mg | Freq: Once | INTRAMUSCULAR | Status: DC | PRN

## 2018-04-15 MED ORDER — VANCOMYCIN HCL IN DEXTROSE 1-5 GM/200ML-% IV SOLN
1000.0000 mg | Freq: Once | INTRAVENOUS | Status: AC
  Administered 2018-04-15: 1000 mg via INTRAVENOUS
  Filled 2018-04-15: qty 200

## 2018-04-15 MED ORDER — ONDANSETRON HCL 4 MG/2ML IJ SOLN: 4.0000 mg | Freq: Four times a day (QID) | INTRAMUSCULAR | Status: DC | PRN

## 2018-04-15 MED ORDER — PHENYLEPHRINE HCL-NACL 20-0.9 MG/250ML-% IV SOLN
0.0000 ug/min | INTRAVENOUS | Status: DC
  Filled 2018-04-15: qty 250

## 2018-04-15 MED ORDER — CHLORHEXIDINE GLUCONATE 4 % EX LIQD: 60.0000 mL | Freq: Once | CUTANEOUS | Status: DC

## 2018-04-15 MED ORDER — POTASSIUM CHLORIDE 10 MEQ/50ML IV SOLN
10.0000 meq | Freq: Once | INTRAVENOUS | Status: AC
  Administered 2018-04-15: 10 meq via INTRAVENOUS
  Filled 2018-04-15: qty 50

## 2018-04-15 MED ORDER — MIDAZOLAM HCL 2 MG/2ML IJ SOLN
INTRAMUSCULAR | Status: AC
  Filled 2018-04-15: qty 2

## 2018-04-15 MED ORDER — SUGAMMADEX SODIUM 200 MG/2ML IV SOLN
INTRAVENOUS | Status: AC
  Filled 2018-04-15: qty 4

## 2018-04-15 MED ORDER — ACETAMINOPHEN 650 MG RE SUPP: 650.0000 mg | Freq: Four times a day (QID) | RECTAL | Status: DC | PRN

## 2018-04-15 MED ORDER — ASPIRIN 81 MG PO CHEW
81.0000 mg | CHEWABLE_TABLET | Freq: Every day | ORAL | Status: DC
  Filled 2018-04-15: qty 1

## 2018-04-15 MED ORDER — SODIUM CHLORIDE 0.9% FLUSH: 3.0000 mL | INTRAVENOUS | Status: DC | PRN

## 2018-04-15 MED ORDER — SODIUM CHLORIDE 0.9 % IV SOLN
INTRAVENOUS | Status: AC
  Administered 2018-04-15: 15:00:00 via INTRAVENOUS

## 2018-04-15 MED ORDER — IODIXANOL 320 MG/ML IV SOLN
INTRAVENOUS | Status: DC | PRN
  Administered 2018-04-15: 50 mL via INTRA_ARTERIAL

## 2018-04-15 MED ORDER — SODIUM CHLORIDE 0.9 % IV SOLN
250.0000 mL | INTRAVENOUS | Status: DC | PRN
Start: 2018-04-15 — End: 2018-04-16

## 2018-04-15 MED ORDER — CHLORHEXIDINE GLUCONATE 4 % EX LIQD: 30.0000 mL | CUTANEOUS | Status: DC

## 2018-04-15 MED ORDER — HEPARIN SODIUM (PORCINE) 1000 UNIT/ML IJ SOLN
INTRAMUSCULAR | Status: DC | PRN
  Administered 2018-04-15: 13000 [IU] via INTRAVENOUS

## 2018-04-15 MED ORDER — SODIUM CHLORIDE 0.9 % IV SOLN
INTRAVENOUS | Status: DC
  Administered 2018-04-15: 09:00:00 via INTRAVENOUS

## 2018-04-15 MED ORDER — ONDANSETRON HCL 4 MG/2ML IJ SOLN
INTRAMUSCULAR | Status: AC
  Filled 2018-04-15: qty 2

## 2018-04-15 MED ORDER — FENTANYL CITRATE (PF) 100 MCG/2ML IJ SOLN: 50.0000 ug | Freq: Once | INTRAMUSCULAR | Status: AC

## 2018-04-15 MED ORDER — OXYCODONE HCL 5 MG PO TABS
5.0000 mg | ORAL_TABLET | ORAL | Status: DC | PRN
Start: 2018-04-15 — End: 2018-04-16
  Administered 2018-04-16: 5 mg via ORAL
  Filled 2018-04-15: qty 1

## 2018-04-15 MED ORDER — TRAMADOL HCL 50 MG PO TABS: 50.0000 mg | ORAL_TABLET | ORAL | Status: DC | PRN

## 2018-04-15 MED ORDER — PROTAMINE SULFATE 10 MG/ML IV SOLN
INTRAVENOUS | Status: AC
  Filled 2018-04-15: qty 5

## 2018-04-15 MED ORDER — PROTAMINE SULFATE 10 MG/ML IV SOLN
INTRAVENOUS | Status: AC
  Filled 2018-04-15: qty 25

## 2018-04-15 MED ORDER — FENTANYL CITRATE (PF) 100 MCG/2ML IJ SOLN
INTRAMUSCULAR | Status: AC
  Administered 2018-04-15: 100 ug
  Filled 2018-04-15: qty 2

## 2018-04-15 MED ORDER — FENTANYL CITRATE (PF) 100 MCG/2ML IJ SOLN: 25.0000 ug | INTRAMUSCULAR | Status: DC | PRN

## 2018-04-15 MED ORDER — LEVOFLOXACIN IN D5W 750 MG/150ML IV SOLN
750.0000 mg | INTRAVENOUS | Status: AC
  Administered 2018-04-16: 750 mg via INTRAVENOUS
  Filled 2018-04-15: qty 150

## 2018-04-15 MED ORDER — 0.9 % SODIUM CHLORIDE (POUR BTL) OPTIME
TOPICAL | Status: DC | PRN
  Administered 2018-04-15: 2000 mL

## 2018-04-15 MED ORDER — MIDAZOLAM HCL 2 MG/2ML IJ SOLN
INTRAMUSCULAR | Status: AC
Start: 2018-04-15 — End: ?
  Filled 2018-04-15: qty 2

## 2018-04-15 MED ORDER — INSULIN ASPART 100 UNIT/ML ~~LOC~~ SOLN
0.0000 [IU] | Freq: Three times a day (TID) | SUBCUTANEOUS | Status: DC
  Administered 2018-04-15: 4 [IU] via SUBCUTANEOUS
  Administered 2018-04-16 (×2): 2 [IU] via SUBCUTANEOUS

## 2018-04-15 MED ORDER — PROTAMINE SULFATE 10 MG/ML IV SOLN
INTRAVENOUS | Status: DC | PRN
  Administered 2018-04-15: 120 mg via INTRAVENOUS
  Administered 2018-04-15: 10 mg via INTRAVENOUS

## 2018-04-15 MED ORDER — SODIUM CHLORIDE 0.9 % IV SOLN
INTRAVENOUS | Status: DC | PRN
  Administered 2018-04-15: 11:00:00 via INTRAVENOUS

## 2018-04-15 MED ORDER — POTASSIUM CHLORIDE 10 MEQ/100ML IV SOLN
INTRAVENOUS | Status: AC
  Filled 2018-04-15: qty 100

## 2018-04-15 MED ORDER — LIDOCAINE HCL 1 % IJ SOLN
INTRAMUSCULAR | Status: DC | PRN
  Administered 2018-04-15: 13.5 mL

## 2018-04-15 MED ORDER — METOPROLOL TARTRATE 5 MG/5ML IV SOLN: 2.5000 mg | INTRAVENOUS | Status: DC | PRN

## 2018-04-15 MED ORDER — LIDOCAINE HCL (PF) 1 % IJ SOLN
INTRAMUSCULAR | Status: AC
  Filled 2018-04-15: qty 30

## 2018-04-15 MED ORDER — DEXMEDETOMIDINE HCL 200 MCG/2ML IV SOLN
INTRAVENOUS | Status: DC | PRN
  Administered 2018-04-15: 86.6 ug via INTRAVENOUS

## 2018-04-15 MED ORDER — SODIUM CHLORIDE 0.9 % IV SOLN
INTRAVENOUS | Status: DC | PRN
  Administered 2018-04-15: 1500 mL

## 2018-04-15 MED ORDER — PHENYLEPHRINE HCL 10 MG/ML IJ SOLN
INTRAMUSCULAR | Status: DC | PRN
  Administered 2018-04-15 (×4): 120 ug via INTRAVENOUS

## 2018-04-15 MED ORDER — NITROGLYCERIN IN D5W 200-5 MCG/ML-% IV SOLN: 0.0000 ug/min | INTRAVENOUS | Status: DC

## 2018-04-15 MED ORDER — SUCCINYLCHOLINE CHLORIDE 200 MG/10ML IV SOSY
PREFILLED_SYRINGE | INTRAVENOUS | Status: AC
  Filled 2018-04-15: qty 10

## 2018-04-15 MED ORDER — SODIUM CHLORIDE 0.9 % IV SOLN
INTRAVENOUS | Status: AC
  Filled 2018-04-15 (×3): qty 1.2

## 2018-04-15 SURGICAL SUPPLY — 96 items
ADH SKN CLS APL DERMABOND .7 (GAUZE/BANDAGES/DRESSINGS) ×2
ADH SKN CLS LQ APL DERMABOND (GAUZE/BANDAGES/DRESSINGS) ×2
BAG DECANTER FOR FLEXI CONT (MISCELLANEOUS) IMPLANT
BAG SNAP BAND KOVER 36X36 (MISCELLANEOUS) ×3 IMPLANT
BLADE CLIPPER SURG (BLADE) IMPLANT
BLADE STERNUM SYSTEM 6 (BLADE) IMPLANT
CABLE ADAPT CONN TEMP 6FT (ADAPTER) ×3 IMPLANT
CANISTER SUCT 3000ML PPV (MISCELLANEOUS) IMPLANT
CANNULA FEM VENOUS REMOTE 22FR (CANNULA) IMPLANT
CANNULA OPTISITE PERFUSION 16F (CANNULA) IMPLANT
CANNULA OPTISITE PERFUSION 18F (CANNULA) IMPLANT
CATH DIAG EXPO 6F VENT PIG 145 (CATHETERS) ×6 IMPLANT
CATH EXPO 5FR AL1 (CATHETERS) IMPLANT
CATH INFINITI 6F AL2 (CATHETERS) IMPLANT
CATH S G BIP PACING (SET/KITS/TRAYS/PACK) ×3 IMPLANT
CLIP VESOCCLUDE MED 24/CT (CLIP) ×3 IMPLANT
CLIP VESOCCLUDE SM WIDE 24/CT (CLIP) ×3 IMPLANT
CONT SPEC 4OZ CLIKSEAL STRL BL (MISCELLANEOUS) ×6 IMPLANT
COVER BACK TABLE 80X110 HD (DRAPES) ×6 IMPLANT
COVER DOME SNAP 22 D (MISCELLANEOUS) IMPLANT
CRADLE DONUT ADULT HEAD (MISCELLANEOUS) ×3 IMPLANT
DERMABOND ADHESIVE PROPEN (GAUZE/BANDAGES/DRESSINGS) ×1
DERMABOND ADVANCED (GAUZE/BANDAGES/DRESSINGS) ×1
DERMABOND ADVANCED .7 DNX12 (GAUZE/BANDAGES/DRESSINGS) ×2 IMPLANT
DERMABOND ADVANCED .7 DNX6 (GAUZE/BANDAGES/DRESSINGS) IMPLANT
DEVICE CLOSURE PERCLS PRGLD 6F (VASCULAR PRODUCTS) ×4 IMPLANT
DRAPE INCISE IOBAN 66X45 STRL (DRAPES) IMPLANT
DRSG TEGADERM 2-3/8X2-3/4 SM (GAUZE/BANDAGES/DRESSINGS) ×1 IMPLANT
DRSG TEGADERM 4X4.75 (GAUZE/BANDAGES/DRESSINGS) ×6 IMPLANT
ELECT CAUTERY BLADE 6.4 (BLADE) IMPLANT
ELECT REM PT RETURN 9FT ADLT (ELECTROSURGICAL) ×6
ELECTRODE REM PT RTRN 9FT ADLT (ELECTROSURGICAL) ×4 IMPLANT
FELT TEFLON 6X6 (MISCELLANEOUS) ×3 IMPLANT
FEMORAL VENOUS CANN RAP (CANNULA) IMPLANT
GAUZE SPONGE 2X2 8PLY STRL LF (GAUZE/BANDAGES/DRESSINGS) IMPLANT
GAUZE SPONGE 4X4 12PLY STRL (GAUZE/BANDAGES/DRESSINGS) ×3 IMPLANT
GAUZE SPONGE 4X4 12PLY STRL LF (GAUZE/BANDAGES/DRESSINGS) ×1 IMPLANT
GLOVE BIO SURGEON STRL SZ7.5 (GLOVE) IMPLANT
GLOVE BIO SURGEON STRL SZ8 (GLOVE) IMPLANT
GLOVE EUDERMIC 7 POWDERFREE (GLOVE) IMPLANT
GLOVE ORTHO TXT STRL SZ7.5 (GLOVE) IMPLANT
GOWN STRL REUS W/ TWL LRG LVL3 (GOWN DISPOSABLE) IMPLANT
GOWN STRL REUS W/ TWL XL LVL3 (GOWN DISPOSABLE) ×2 IMPLANT
GOWN STRL REUS W/TWL LRG LVL3 (GOWN DISPOSABLE)
GOWN STRL REUS W/TWL XL LVL3 (GOWN DISPOSABLE) ×3
GUIDEWIRE SAF TJ AMPL .035X180 (WIRE) ×3 IMPLANT
GUIDEWIRE SAFE TJ AMPLATZ EXST (WIRE) ×3 IMPLANT
GUIDEWIRE STRAIGHT .035 260CM (WIRE) ×3 IMPLANT
INSERT FOGARTY SM (MISCELLANEOUS) IMPLANT
KIT BASIN OR (CUSTOM PROCEDURE TRAY) ×3 IMPLANT
KIT DILATOR VASC 18G NDL (KITS) IMPLANT
KIT HEART LEFT (KITS) ×3 IMPLANT
KIT SUCTION CATH 14FR (SUCTIONS) ×6 IMPLANT
KIT TURNOVER KIT B (KITS) ×3 IMPLANT
LOOP VESSEL MAXI BLUE (MISCELLANEOUS) IMPLANT
LOOP VESSEL MINI RED (MISCELLANEOUS) IMPLANT
NDL PERC 18GX7CM (NEEDLE) ×2 IMPLANT
NEEDLE PERC 18GX7CM (NEEDLE) ×3 IMPLANT
NS IRRIG 1000ML POUR BTL (IV SOLUTION) ×9 IMPLANT
PACK ENDOVASCULAR (PACKS) ×3 IMPLANT
PAD ARMBOARD 7.5X6 YLW CONV (MISCELLANEOUS) ×6 IMPLANT
PAD ELECT DEFIB RADIOL ZOLL (MISCELLANEOUS) ×3 IMPLANT
PENCIL BUTTON HOLSTER BLD 10FT (ELECTRODE) ×3 IMPLANT
PERCLOSE PROGLIDE 6F (VASCULAR PRODUCTS) ×6
SET MICROPUNCTURE 5F STIFF (MISCELLANEOUS) ×3 IMPLANT
SHEATH BRITE TIP 6FR 35CM (SHEATH) ×3 IMPLANT
SHEATH PINNACLE 6F 10CM (SHEATH) IMPLANT
SHEATH PINNACLE 8F 10CM (SHEATH) ×3 IMPLANT
SLEEVE REPOSITIONING LENGTH 30 (MISCELLANEOUS) ×3 IMPLANT
SPONGE GAUZE 2X2 STER 10/PKG (GAUZE/BANDAGES/DRESSINGS) ×1
SPONGE LAP 4X18 RFD (DISPOSABLE) ×3 IMPLANT
STOPCOCK MORSE 400PSI 3WAY (MISCELLANEOUS) ×6 IMPLANT
SUT ETHIBOND X763 2 0 SH 1 (SUTURE) IMPLANT
SUT GORETEX CV 4 TH 22 36 (SUTURE) IMPLANT
SUT GORETEX CV4 TH-18 (SUTURE) IMPLANT
SUT MNCRL AB 3-0 PS2 18 (SUTURE) IMPLANT
SUT PROLENE 5 0 C 1 36 (SUTURE) IMPLANT
SUT PROLENE 6 0 C 1 30 (SUTURE) IMPLANT
SUT SILK  1 MH (SUTURE) ×1
SUT SILK 1 MH (SUTURE) ×2 IMPLANT
SUT VIC AB 2-0 CT1 27 (SUTURE)
SUT VIC AB 2-0 CT1 TAPERPNT 27 (SUTURE) IMPLANT
SUT VIC AB 2-0 CTX 36 (SUTURE) IMPLANT
SUT VIC AB 3-0 SH 8-18 (SUTURE) IMPLANT
SYR 50ML LL SCALE MARK (SYRINGE) ×3 IMPLANT
SYR BULB IRRIGATION 50ML (SYRINGE) IMPLANT
SYR CONTROL 10ML LL (SYRINGE) IMPLANT
SYR MEDRAD MARK V 150ML (SYRINGE) ×1 IMPLANT
TAPE CLOTH SURG 6X10 WHT LF (GAUZE/BANDAGES/DRESSINGS) ×1 IMPLANT
TOWEL GREEN STERILE (TOWEL DISPOSABLE) ×6 IMPLANT
TRANSDUCER W/STOPCOCK (MISCELLANEOUS) ×6 IMPLANT
TRAY FOLEY SLVR 14FR TEMP STAT (SET/KITS/TRAYS/PACK) IMPLANT
TUBE SUCT INTRACARD DLP 20F (MISCELLANEOUS) IMPLANT
VALVE HEART TRANSCATH SZ3 23MM (Prosthesis & Implant Heart) ×1 IMPLANT
WIRE .035 3MM-J 145CM (WIRE) ×3 IMPLANT
WIRE BENTSON .035X145CM (WIRE) ×3 IMPLANT

## 2018-04-15 NOTE — Progress Notes (Signed)
Site:  Left Femoral sheath pulled by Cam Hai, [facility] Sheath Size: 6Fr venous & 75fr arterial Condition prior to removal:  Level 0 Type of pressure held: manual Time pressure held:  20 min. Status of patient during pull:  stable Condition of site post pull:  Level 0 Type of dressing applied: gauze Pulses verified: bilat DPs - moderate Patient's condition post pull: stable Bedrest begins at: 1315 Post instructions given to patient: yes

## 2018-04-15 NOTE — Interval H&P Note (Signed)
History and Physical Interval Note:  04/15/2018 1:17 PM  Janet Williamson  has presented today for surgery, with the diagnosis of Severe Aortic Stenosis  The various methods of treatment have been discussed with the patient and family. After consideration of risks, benefits and other options for treatment, the patient has consented to  Procedure(s): TRANSCATHETER AORTIC VALVE REPLACEMENT, TRANSFEMORAL (Bilateral) TRANSESOPHAGEAL ECHOCARDIOGRAM (TEE) (N/A) as a surgical intervention .  The patient's history has been reviewed, patient examined, no change in status, stable for surgery.  I have reviewed the patient's chart and labs.  Questions were answered to the patient's satisfaction.     Tonny Bollman

## 2018-04-15 NOTE — Anesthesia Preprocedure Evaluation (Signed)
Anesthesia Evaluation  Patient identified by MRN, date of birth, ID band Patient awake    Reviewed: Allergy & Precautions, NPO status , Patient's Chart, lab work & pertinent test results  Airway Mallampati: II  TM Distance: >3 FB Neck ROM: Full    Dental  (+) Teeth Intact, Dental Advisory Given   Pulmonary    breath sounds clear to auscultation       Cardiovascular hypertension,  Rhythm:Regular Rate:Normal + Systolic murmurs    Neuro/Psych    GI/Hepatic   Endo/Other  diabetes  Renal/GU      Musculoskeletal   Abdominal   Peds  Hematology   Anesthesia Other Findings   Reproductive/Obstetrics                             Anesthesia Physical Anesthesia Plan  ASA: III  Anesthesia Plan: MAC   Post-op Pain Management:    Induction: Intravenous  PONV Risk Score and Plan: Ondansetron and Dexamethasone  Airway Management Planned: Natural Airway and Simple Face Mask  Additional Equipment: Arterial line and CVP  Intra-op Plan:   Post-operative Plan:   Informed Consent: I have reviewed the patients History and Physical, chart, labs and discussed the procedure including the risks, benefits and alternatives for the proposed anesthesia with the patient or authorized representative who has indicated his/her understanding and acceptance.     Plan Discussed with: CRNA and Anesthesiologist  Anesthesia Plan Comments:         Anesthesia Quick Evaluation

## 2018-04-15 NOTE — Transfer of Care (Signed)
Immediate Anesthesia Transfer of Care Note  Patient: Janet Williamson  Procedure(s) Performed: TRANSCATHETER AORTIC VALVE REPLACEMENT, TRANSFEMORAL (Bilateral Groin) TRANSESOPHAGEAL ECHOCARDIOGRAM (TEE) (N/A )  Patient Location: Cath Lab  Anesthesia Type:MAC  Level of Consciousness: awake, alert  and oriented  Airway & Oxygen Therapy: Patient Spontanous Breathing and Patient connected to nasal cannula oxygen  Post-op Assessment: Report given to RN, Post -op Vital signs reviewed and stable and Patient moving all extremities  Post vital signs: Reviewed and stable  Last Vitals:  Vitals Value Taken Time  BP 113/49 04/15/2018 12:51 PM  Temp    Pulse 55 04/15/2018 12:55 PM  Resp 14 04/15/2018 12:55 PM  SpO2 97 % 04/15/2018 12:55 PM  Vitals shown include unvalidated device data.  Last Pain:  Vitals:   04/15/18 0831  TempSrc:   PainSc: 0-No pain      Patients Stated Pain Goal: 3 (48/25/00 3704)  Complications: No apparent anesthesia complications

## 2018-04-15 NOTE — Progress Notes (Addendum)
  [facility]   MULTIDISCIPLINARY HEART VALVE TEAM  Patient doing well s/p TAVR. She is recovering in cath lab holding area. She is hemodynamically stable. Groin sites stable. ECG with old LBBB and new 1st degree AV block. K noted to be 3.2 and given a run of K.   Plan to DC arterial line and transfer to 4E stepdown unit. Plan for early ambulation after bedrest.   Cline Crock PA-C  [facility]  Pager [phone]

## 2018-04-15 NOTE — Progress Notes (Signed)
Pt has remained stable during post-op recovery.  She has not required an support for BP.  Her bedrest began at 1315 and is 4 hours.  Bilat. Femoral sites have remained soft and at a Level 0.  Pt is drowsy, however, rouses easily and is oriented X4.  Her Chem8 was WNL, with the exception of a 3.2 potassium level.  Dr. Noreene Larsson ordered IVPB and this is infusing in her central line.

## 2018-04-15 NOTE — Anesthesia Procedure Notes (Signed)
Procedure Name: MAC Date/Time: 04/15/2018 10:35 AM Performed by: Leonor Liv, CRNA Pre-anesthesia Checklist: Patient identified, Emergency Drugs available, Suction available, Patient being monitored and Timeout performed Oxygen Delivery Method: Simple face mask Placement Confirmation: positive ETCO2

## 2018-04-15 NOTE — Anesthesia Postprocedure Evaluation (Signed)
Anesthesia Post Note  Patient: Janet Williamson  Procedure(s) Performed: TRANSCATHETER AORTIC VALVE REPLACEMENT, TRANSFEMORAL (Bilateral Groin) TRANSESOPHAGEAL ECHOCARDIOGRAM (TEE) (N/A )     Patient location during evaluation: Cath Lab Anesthesia Type: MAC Level of consciousness: awake and alert Pain management: pain level controlled Vital Signs Assessment: post-procedure vital signs reviewed and stable Respiratory status: spontaneous breathing, nonlabored ventilation, respiratory function stable and patient connected to nasal cannula oxygen Cardiovascular status: stable and blood pressure returned to baseline Postop Assessment: no apparent nausea or vomiting Anesthetic complications: no    Last Vitals:  Vitals:   04/15/18 1700 04/15/18 1806  BP: 133/68 (!) 142/65  Pulse:    Resp: 12 16  Temp:  36.4 C  SpO2: 93% 94%    Last Pain:  Vitals:   04/15/18 1806  TempSrc: Oral  PainSc:                  Gavriela Cashin COKER

## 2018-04-15 NOTE — Op Note (Signed)
[facility]   MULTIDISCIPLINARY HEART VALVE TEAM   TAVR OPERATIVE NOTE   Date of Procedure:  04/15/2018  Preoperative Diagnosis: Severe Aortic Stenosis   Postoperative Diagnosis: Same   Procedure:    Transcatheter Aortic Valve Replacement - Percutaneous Right Transfemoral Approach  Edwards Sapien 3 THV (size 23 mm, model # 9600TFX, serial # [id number])   Co-Surgeons:  Salvatore Decent. Cornelius Moras, MD and Tonny Bollman, MD  Anesthesiologist:  Kipp Brood, MD  Echocardiographer:  Tobias Alexander, MD  Pre-operative Echo Findings:  Severe aortic stenosis  Normal left ventricular systolic function  Post-operative Echo Findings:  No paravalvular leak  Normal left ventricular systolic function   BRIEF CLINICAL NOTE AND INDICATIONS FOR SURGERY  The patient is a 77 year old woman with history of diabetes, hyperlipidemia, hypertension, paroxysmal atrial fibrillation on Xarelto status post cardioversion in September 2018, prior embolic stroke in 2008, and aortic stenosis who was referred for consideration of transcatheter aortic valve replacement.  I had seen her in 2008 when she presented with an embolic stroke and was found to have a mass on her mitral valve.  It was unclear if this was a fibroblastoma or papilloma or possibly dystrophic calcification and we decided to follow it.  She has been followed by Dr. Anne Fu since that and has done well but over the past year has developed progressive shortness of breath and fatigue with activity. She had an echo on 02/26/2018 showing progression to severe aortic stenosis with a mean gradient of 47 mmHg and a peak gradient of 71 mmHg.  The dimensionless index was 0.34.  Left ventricular ejection fraction was 55 to 60%.  She underwent cardiac catheterization on 03/06/2018 which showed no significant coronary disease.  The peak to peak gradient across the aortic valve was 32 mmHg.  She was evaluated by Dr. Excell Seltzer and a repeat TEE was performed to  evaluate the mitral valve given her history of a mitral valve mass.  This showed no evidence of mitral valve mass or vegetation.  There is mild regurgitation.  The leaflets were mildly thickened with mild mitral annular calcification.  The aortic valve was severely calcified and restricted with a mean gradient of 36 mmHg.   During the course of the patient's preoperative work up they have been evaluated comprehensively by a multidisciplinary team of specialists coordinated through the Multidisciplinary Heart Valve Clinic in the [facility].  They have been demonstrated to suffer from symptomatic severe aortic stenosis as noted above. The patient has been counseled extensively as to the relative risks and benefits of all options for the treatment of severe aortic stenosis including long term medical therapy, conventional surgery for aortic valve replacement, and transcatheter aortic valve replacement.  All questions have been answered, and the patient provides full informed consent for the operation as described.   DETAILS OF THE OPERATIVE PROCEDURE  PREPARATION:    The patient is brought to the operating room on the above mentioned date and central monitoring was established by the anesthesia team including placement of a central venous line and radial arterial line. The patient is placed in the supine position on the operating table.  Intravenous antibiotics are administered. The patient is monitored closely throughout the procedure under conscious sedation.  Baseline transthoracic echocardiogram was performed. The patient's chest, abdomen, both groins, and both lower extremities are prepared and draped in a sterile manner. A time out procedure is performed.   PERIPHERAL ACCESS:    Using the modified Seldinger  technique, femoral arterial and venous access was obtained with placement of 6 Fr sheaths on the left side.  A pigtail diagnostic catheter was passed through the left  arterial sheath under fluoroscopic guidance into the aortic root.  A temporary transvenous pacemaker catheter was passed through the left femoral venous sheath under fluoroscopic guidance into the right ventricle.  The pacemaker was tested to ensure stable lead placement and pacemaker capture. Aortic root angiography was performed in order to determine the optimal angiographic angle for valve deployment.   TRANSFEMORAL ACCESS:   Percutaneous transfemoral access and sheath placement was performed by Dr. Excell Seltzer using ultrasound guidance.  The right common femoral artery was cannulated using a micropuncture needle and appropriate location was verified using hand injection angiogram.  A pair of Abbott Perclose percutaneous closure devices were placed and a 6 French sheath replaced into the femoral artery.  The patient was heparinized systemically and ACT verified > 250 seconds.    A 14 Fr transfemoral E-sheath was introduced into the right common femoral artery after progressively dilating over an Amplatz superstiff wire. An AL-1 catheter was used to direct a straight-tip exchange length wire across the native aortic valve into the left ventricle. This was exchanged out for a pigtail catheter and position was confirmed in the LV apex. Simultaneous LV and Ao pressures were recorded.  The pigtail catheter was exchanged for an Amplatz Extra-stiff wire in the LV apex.  Echocardiography was utilized to confirm appropriate wire position and no sign of entanglement in the mitral subvalvular apparatus.   TRANSCATHETER HEART VALVE DEPLOYMENT:   An Edwards Sapien 3 transcatheter heart valve (size 23 mm, model #9600TFX, serial #[id number]) was prepared and crimped per manufacturer's guidelines, and the proper orientation of the valve is confirmed on the [employer] delivery system. The valve was advanced through the introducer sheath using normal technique until in an appropriate position in the abdominal aorta  beyond the sheath tip. The balloon was then retracted and using the fine-tuning wheel was centered on the valve. The valve was then advanced across the aortic arch using appropriate flexion of the catheter. The valve was carefully positioned across the aortic valve annulus. The Commander catheter was retracted using normal technique. Once final position of the valve has been confirmed by angiographic assessment, the valve is deployed while temporarily holding ventilation and during rapid ventricular pacing to maintain systolic blood pressure < 50 mmHg and pulse pressure < 10 mmHg. The balloon inflation is held for >3 seconds after reaching full deployment volume. Once the balloon has fully deflated the balloon is retracted into the ascending aorta and valve function is assessed using echocardiography. There is felt to be no paravalvular leak and no central aortic insufficiency.  The patient's hemodynamic recovery following valve deployment is good.  The deployment balloon and guidewire are both removed.    PROCEDURE COMPLETION:   The sheath was removed and femoral artery closure performed by Dr Excell Seltzer.  Protamine was administered once femoral arterial repair was complete. The temporary pacemaker, pigtail catheters and femoral sheaths were removed with manual pressure used for hemostasis.   The patient tolerated the procedure well and is transported to the surgical intensive care in stable condition. There were no immediate intraoperative complications. All sponge instrument and needle counts are verified correct at completion of the operation.   No blood products were administered during the operation.  The patient received a total of 40.3 mL of intravenous contrast during the procedure.   Purcell Nails,  MD 04/15/2018 12:18 PM

## 2018-04-15 NOTE — Plan of Care (Signed)
  Problem: Education: Goal: Knowledge of General Education information will improve Outcome: Progressing   Problem: Health Behavior/Discharge Planning: Goal: Ability to manage health-related needs will improve Outcome: Progressing   Problem: Clinical Measurements: Goal: Ability to maintain clinical measurements within normal limits will improve Outcome: Progressing Goal: Will remain free from infection Outcome: Progressing Goal: Diagnostic test results will improve Outcome: Progressing Goal: Cardiovascular complication will be avoided Outcome: Progressing   Problem: Activity: Goal: Risk for activity intolerance will decrease Outcome: Progressing   Problem: Nutrition: Goal: Adequate nutrition will be maintained Outcome: Progressing   Problem: Elimination: Goal: Will not experience complications related to bowel motility Outcome: Progressing Goal: Will not experience complications related to urinary retention Outcome: Progressing   Problem: Pain Managment: Goal: General experience of comfort will improve Outcome: Progressing   Problem: Safety: Goal: Ability to remain free from injury will improve Outcome: Progressing   Problem: Skin Integrity: Goal: Risk for impaired skin integrity will decrease Outcome: Progressing   

## 2018-04-15 NOTE — Op Note (Signed)
[facility]   MULTIDISCIPLINARY HEART VALVE TEAM   TAVR OPERATIVE NOTE   Date of Procedure:  04/15/2018  Preoperative Diagnosis: Severe Aortic Stenosis   Postoperative Diagnosis: Same   Procedure:    Transcatheter Aortic Valve Replacement - Percutaneous  Transfemoral Approach  Edwards Sapien 3 THV (size 23 mm, model # 9600TFX, serial # [id number])   Co-Surgeons:  Salvatore Decent. Cornelius Moras, MD and Tonny Bollman, MD  Anesthesiologist:  Dr Noreene Larsson  Echocardiographer:  Dr Delton See  Pre-operative Echo Findings:  Severe aortic stenosis  Normal left ventricular systolic function  Post-operative Echo Findings:  No paravalvular leak  Normal/unchanged left ventricular systolic function  BRIEF CLINICAL NOTE AND INDICATIONS FOR SURGERY  77 yo woman with DM, HTN, PAF on Xarelto, who has developed progressive and now severe aortic stenosis with associated symptoms of NYHA II chronic diastolic heart failure. After multidisciplinary heart team evaluation she is referred for TAVR for treatment of Severe, Stage D1, aortic stenosis.   During the course of the patient's preoperative work up they have been evaluated comprehensively by a multidisciplinary team of specialists coordinated through the Multidisciplinary Heart Valve Clinic in the [facility].  They have been demonstrated to suffer from symptomatic severe aortic stenosis as noted above. The patient has been counseled extensively as to the relative risks and benefits of all options for the treatment of severe aortic stenosis including long term medical therapy, conventional surgery for aortic valve replacement, and transcatheter aortic valve replacement.  The patient has been independently evaluated byDr Laneta Simmers with cardiac surgery, and they are felt to be at moderate risk for conventional surgical aortic valve replacement. Based upon review of all of the patient's preoperative diagnostic tests they are felt to  be candidate for transcatheter aortic valve replacement using the transfemoral approach as an alternative to high risk conventional surgery.    Following the decision to proceed with transcatheter aortic valve replacement, a discussion has been held regarding what types of management strategies would be attempted intraoperatively in the event of life-threatening complications, including whether or not the patient would be considered a candidate for the use of cardiopulmonary bypass and/or conversion to open sternotomy for attempted surgical intervention.  The patient has been advised of a variety of complications that might develop peculiar to this approach including but not limited to risks of death, stroke, paravalvular leak, aortic dissection or other major vascular complications, aortic annulus rupture, device embolization, cardiac rupture or perforation, acute myocardial infarction, arrhythmia, heart block or bradycardia requiring permanent pacemaker placement, congestive heart failure, respiratory failure, renal failure, pneumonia, infection, other late complications related to structural valve deterioration or migration, or other complications that might ultimately cause a temporary or permanent loss of functional independence or other long term morbidity.  The patient provides full informed consent for the procedure as described and all questions were answered preoperatively.  DETAILS OF THE OPERATIVE PROCEDURE  PREPARATION:   The patient is brought to the operating room on the above mentioned date and central monitoring was established by the anesthesia team including placement of a central venous catheter and radial arterial line. The patient is placed in the supine position on the operating table.  Intravenous antibiotics are administered. The patient is monitored closely throughout the procedure under conscious sedation. Baseline transthoracic echocardiogram is performed. The patient's chest,  abdomen, both groins, and both lower extremities are prepared and draped in a sterile manner. A time out procedure is performed.   PERIPHERAL ACCESS:  Using ultrasound guidance, femoral arterial and venous access is obtained with placement of 6 Fr sheaths on the left side.  A pigtail diagnostic catheter was passed through the femoral arterial sheath under fluoroscopic guidance into the aortic root.  A temporary transvenous pacemaker catheter was passed through the femoral venous sheath under fluoroscopic guidance into the right ventricle.  The pacemaker was tested to ensure stable lead placement and pacemaker capture. Aortic root angiography was performed in order to determine the optimal angiographic angle for valve deployment.  TRANSFEMORAL ACCESS:  A micropuncture technique is used to access the right femoral artery under fluoroscopic and ultrasound guidance.  [place] images are captured and stored in the patient's chart. 2 Perclose devices are deployed at 10' and 2' positions to 'PreClose' the femoral artery. An 8 French sheath is placed and then an Amplatz Superstiff wire is advanced through the sheath. This is changed out for a 14 French transfemoral E-Sheath after progressively dilating over the Superstiff wire.  An AL-1 catheter was used to direct a straight-tip exchange length wire across the native aortic valve into the left ventricle. This was exchanged out for a pigtail catheter and position was confirmed in the LV apex. Simultaneous LV and Ao pressures were recorded.  The pigtail catheter was exchanged for an Amplatz Extra-stiff wire in the LV apex.  Echocardiography was utilized to confirm appropriate wire position and no sign of entanglement in the mitral subvalvular apparatus.  BALLOON AORTIC VALVULOPLASTY:  Not performed.   TRANSCATHETER HEART VALVE DEPLOYMENT:  An Edwards Sapien 3 transcatheter heart valve (size 23 mm, model #9600TFX, serial #[id number]) was prepared and crimped per  manufacturer's guidelines, and the proper orientation of the valve is confirmed on the [employer] delivery system. The valve was advanced through the introducer sheath using normal technique until in an appropriate position in the abdominal aorta beyond the sheath tip. The balloon was then retracted and using the fine-tuning wheel was centered on the valve. The valve was then advanced across the aortic arch using appropriate flexion of the catheter. The valve was carefully positioned across the aortic valve annulus. The Commander catheter was retracted using normal technique. Once final position of the valve has been confirmed by angiographic assessment, the valve is deployed while temporarily holding ventilation and during rapid ventricular pacing to maintain systolic blood pressure < 50 mmHg and pulse pressure < 10 mmHg. The balloon inflation is held for >3 seconds after reaching full deployment volume. Once the balloon has fully deflated the balloon is retracted into the ascending aorta and valve function is assessed using echocardiography. There is felt to be no paravalvular leak and no central aortic insufficiency.  The patient's hemodynamic recovery following valve deployment is good.  The deployment balloon and guidewire are both removed. Echo demostrated acceptable post-procedural gradients, stable mitral valve function, and no aortic insufficiency.   PROCEDURE COMPLETION:  The sheath was removed and femoral artery closure is performed using the 2 previously deployed Perclose devices.  Protamine is administered once femoral arterial repair was complete. The site is clear with no evidence of bleeding or hematoma after the sutures are tightened. The temporary pacemaker, pigtail catheters and femoral sheaths were removed with manual pressure used for hemostasis.   The patient tolerated the procedure well and is transported to the surgical intensive care in stable condition. There were no immediate  intraoperative complications. All sponge instrument and needle counts are verified correct at completion of the operation.   The patient received a total of  40 mL of intravenous contrast during the procedure.   Tonny Bollman, MD 04/15/2018 12:30 PM

## 2018-04-15 NOTE — Progress Notes (Signed)
PT received from cath lab. VSS. Telemetry applied. CHG complete. Bilateral groin sites level 0. Pt oriented to room and unit. Will continue to monitor.  Versie Starks, RN

## 2018-04-15 NOTE — Anesthesia Procedure Notes (Signed)
Arterial Line Insertion Start/End7/06/2018 9:45 AM, 04/15/2018 10:00 AM Performed by: White, Cordella Register, Scientist, clinical (histocompatibility and immunogenetics), CRNA  Patient location: Pre-op. Preanesthetic checklist: patient identified, IV checked, site marked, risks and benefits discussed, surgical consent, monitors and equipment checked, pre-op evaluation, timeout performed and anesthesia consent Lidocaine 1% used for infiltration and patient sedated Right, radial was placed Catheter size: 20 G Hand hygiene performed  and maximum sterile barriers used  Allen's test indicative of satisfactory collateral circulation Attempts: 1 Procedure performed without using ultrasound guided technique. Following insertion, dressing applied and Biopatch. Post procedure assessment: normal  Patient tolerated the procedure well with no immediate complications.

## 2018-04-15 NOTE — Anesthesia Procedure Notes (Addendum)
Central Venous Catheter Insertion Performed by: Kipp Brood, MD, anesthesiologist Start/End7/06/2018 10:05 AM, 04/15/2018 10:15 AM Patient location: Pre-op. Preanesthetic checklist: patient identified, IV checked, site marked, risks and benefits discussed, surgical consent, monitors and equipment checked, pre-op evaluation, timeout performed and anesthesia consent Lidocaine 1% used for infiltration and patient sedated Hand hygiene performed  and maximum sterile barriers used  Catheter size: 8 Fr Total catheter length 16. Central line was placed.Double lumen Procedure performed using ultrasound guided technique. Ultrasound Notes:image(s) printed for medical record Attempts: 1 Following insertion, dressing applied and line sutured. Post procedure assessment: blood return through all ports  Patient tolerated the procedure well with no immediate complications.

## 2018-04-16 ENCOUNTER — Inpatient Hospital Stay (HOSPITAL_COMMUNITY): Payer: Medicare Other

## 2018-04-16 ENCOUNTER — Encounter (HOSPITAL_COMMUNITY): Payer: Self-pay | Admitting: Cardiovascular Disease

## 2018-04-16 ENCOUNTER — Other Ambulatory Visit: Payer: Self-pay | Admitting: Physician Assistant

## 2018-04-16 DIAGNOSIS — Z952 Presence of prosthetic heart valve: Secondary | ICD-10-CM

## 2018-04-16 DIAGNOSIS — I350 Nonrheumatic aortic (valve) stenosis: Principal | ICD-10-CM

## 2018-04-16 DIAGNOSIS — I5030 Unspecified diastolic (congestive) heart failure: Secondary | ICD-10-CM

## 2018-04-16 LAB — BASIC METABOLIC PANEL
ANION GAP: 11 (ref 5–15)
BUN: 10 mg/dL (ref 8–23)
CO2: 27 mmol/L (ref 22–32)
Calcium: 9.4 mg/dL (ref 8.9–10.3)
Chloride: 98 mmol/L (ref 98–111)
Creatinine, Ser: 0.74 mg/dL (ref 0.44–1.00)
GFR calc Af Amer: >60 mL/min (ref >60)
GLUCOSE: 129 mg/dL — AB (ref 70–99)
POTASSIUM: 3.4 mmol/L — AB (ref 3.5–5.1)
Sodium: 136 mmol/L (ref 135–145)

## 2018-04-16 LAB — CBC
HCT: 30.6 % — ABNORMAL LOW (ref 36.0–46.0)
Hemoglobin: 8.9 g/dL — ABNORMAL LOW (ref 12.0–15.0)
MCH: 20.5 pg — ABNORMAL LOW (ref 26.0–34.0)
MCHC: 29.1 g/dL — ABNORMAL LOW (ref 30.0–36.0)
MCV: 70.5 fL — ABNORMAL LOW (ref 78.0–100.0)
PLATELETS: 397 10*3/uL (ref 150–400)
RBC: 4.34 MIL/uL (ref 3.87–5.11)
RDW: 19.1 % — ABNORMAL HIGH (ref 11.5–15.5)
WBC: 13 10*3/uL — AB (ref 4.0–10.5)

## 2018-04-16 LAB — ECHOCARDIOGRAM LIMITED
HEIGHTINCHES: 63 in
WEIGHTICAEL: 3061.75 [oz_av]

## 2018-04-16 LAB — MAGNESIUM: Magnesium: 1.7 mg/dL (ref 1.7–2.4)

## 2018-04-16 LAB — GLUCOSE, CAPILLARY
GLUCOSE-CAPILLARY: 147 mg/dL — AB (ref 70–99)
Glucose-Capillary: 131 mg/dL — ABNORMAL HIGH (ref 70–99)

## 2018-04-16 MED ORDER — AMLODIPINE BESYLATE 5 MG PO TABS
5.0000 mg | ORAL_TABLET | Freq: Every day | ORAL | Status: DC
  Administered 2018-04-16: 5 mg via ORAL
  Filled 2018-04-16: qty 1

## 2018-04-16 MED ORDER — METOPROLOL SUCCINATE ER 50 MG PO TB24
50.0000 mg | ORAL_TABLET | Freq: Every day | ORAL | Status: DC
  Administered 2018-04-16: 50 mg via ORAL
  Filled 2018-04-16: qty 1

## 2018-04-16 MED ORDER — HYDROCHLOROTHIAZIDE 25 MG PO TABS
25.0000 mg | ORAL_TABLET | Freq: Every day | ORAL | Status: DC
  Administered 2018-04-16: 25 mg via ORAL
  Filled 2018-04-16: qty 1

## 2018-04-16 MED ORDER — SIMVASTATIN 20 MG PO TABS: 20.0000 mg | ORAL_TABLET | Freq: Every evening | ORAL | Status: DC

## 2018-04-16 MED ORDER — POTASSIUM CHLORIDE CRYS ER 20 MEQ PO TBCR
20.0000 meq | EXTENDED_RELEASE_TABLET | Freq: Once | ORAL | Status: AC
  Administered 2018-04-16: 20 meq via ORAL
  Filled 2018-04-16: qty 1

## 2018-04-16 MED FILL — Heparin Sodium (Porcine) Inj 1000 Unit/ML: INTRAMUSCULAR | Qty: 30 | Status: AC

## 2018-04-16 MED FILL — Magnesium Sulfate Inj 50%: INTRAMUSCULAR | Qty: 10 | Status: AC

## 2018-04-16 MED FILL — Potassium Chloride Inj 2 mEq/ML: INTRAVENOUS | Qty: 40 | Status: AC

## 2018-04-16 NOTE — Progress Notes (Addendum)
[facility] VALVE TEAM  Patient Name: Janet Williamson Date of Encounter: 04/16/2018  Primary Cardiologist: Dr. Marlou Porch / Dr. Burt Knack & Dr. Roxy Manns (TAVR)  Hospital Problem List     Principal Problem:   S/P TAVR (transcatheter aortic valve replacement) Active Problems:   DM (diabetes mellitus) ([place])   HTN (hypertension)   Anemia   Hypertension   History of stroke   PAF (paroxysmal atrial fibrillation) ([facility])   Severe aortic stenosis  Subjective   No complaints. Has been up walking around and can already tell a difference in her breathing. Feels ready to go home .  Inpatient Medications    Scheduled Meds: . amLODipine  5 mg Oral Daily  . aspirin  81 mg Oral Daily  . hydrochlorothiazide  25 mg Oral Daily  . insulin aspart  0-24 Units Subcutaneous TID AC & HS  . metoprolol succinate  50 mg Oral Daily  . simvastatin  20 mg Oral QPM  . sodium chloride flush  3 mL Intravenous Q12H   Continuous Infusions: . sodium chloride    . levofloxacin (LEVAQUIN) IV 750 mg (04/16/18 1005)  . nitroGLYCERIN    . phenylephrine (NEO-SYNEPHRINE) Adult infusion     PRN Meds: sodium chloride, acetaminophen **OR** acetaminophen, fentaNYL (SUBLIMAZE) injection, metoprolol tartrate, morphine injection, ondansetron (ZOFRAN) IV, ondansetron (ZOFRAN) IV, oxyCODONE, sodium chloride flush, traMADol   Vital Signs    Vitals:   04/16/18 0425 04/16/18 0437 04/16/18 0834 04/16/18 1020  BP: (!) 149/57  (!) 147/60   Pulse:      Resp: 18  18 14   Temp: 99 F (37.2 C)  99.2 F (37.3 C)   TempSrc: Oral  Oral   SpO2: 96%   92%  Weight:  191 lb 5.8 oz (86.8 kg)    Height:        Intake/Output Summary (Last 24 hours) at 04/16/2018 1106 Last data filed at 04/16/2018 0425 Gross per 24 hour  Intake 1618.82 ml  Output 1950 ml  Net -331.18 ml   Filed Weights   04/15/18 0808 04/16/18 0437  Weight: 191 lb (86.6 kg) 191 lb 5.8 oz (86.8 kg)    Physical Exam   GEN:  Well nourished, well developed, in no acute distress.  HEENT: Grossly normal.  Neck: Supple, no JVD, carotid bruits, or masses. Cardiac: RRR. 2/6 SEM @RUSB . rubs, or gallops. No clubbing, cyanosis, edema.  Radials/DP/PT 2+ and equal bilaterally.  Respiratory:  Respirations regular and unlabored, clear to auscultation bilaterally. GI: Soft, nontender, nondistended, BS + x 4. MS: no deformity or atrophy. Skin: warm and dry, no rash. Groin sites soft and non tender with no hematoma Neuro:  Strength and sensation are intact. Psych: AAOx3.  Normal affect.  Labs    CBC Recent Labs    04/15/18 1549 04/16/18 0441  WBC  --  13.0*  HGB 8.2* 8.9*  HCT 24.0* 30.6*  MCV  --  70.5*  PLT  --  062   Basic Metabolic Panel Recent Labs    04/15/18 1549 04/16/18 0441  NA 139 136  K 3.9 3.4*  CL 100 98  CO2  --  27  GLUCOSE 180* 129*  BUN 7* 10  CREATININE 0.60 0.74  CALCIUM  --  9.4  MG  --  1.7   Liver Function Tests No results for input(s): AST, ALT, ALKPHOS, BILITOT, PROT, ALBUMIN in the last 72 hours. No results for input(s): LIPASE, AMYLASE in the last 72 hours.  Cardiac Enzymes No results for input(s): CKTOTAL, CKMB, CKMBINDEX, TROPONINI in the last 72 hours. BNP Invalid input(s): POCBNP D-Dimer No results for input(s): DDIMER in the last 72 hours. Hemoglobin A1C No results for input(s): HGBA1C in the last 72 hours. Fasting Lipid Panel No results for input(s): CHOL, HDL, LDLCALC, TRIG, CHOLHDL, LDLDIRECT in the last 72 hours. Thyroid Function Tests No results for input(s): TSH, T4TOTAL, T3FREE, THYROIDAB in the last 72 hours.  Invalid input(s): FREET3  Telemetry    sinus- Personally Reviewed  ECG    Sinus with LBBB- Personally Reviewed  Radiology    Dg Chest Port 1 View  Result Date: 04/15/2018 CLINICAL DATA:  77 year old female postoperative day zero status post TAVR for severe aortic stenosis. EXAM: PORTABLE CHEST 1 VIEW COMPARISON:  Chest radiograph 04/11/2018  and earlier. FINDINGS: Portable AP semi upright view at 1439 hours. Right IJ central line in place with tip at the lower SVC level. No pneumothorax or pulmonary edema. Mildly lower lung volumes. No confluent pulmonary opacity. Prosthetic aortic valve in place. Stable cardiomegaly and mediastinal contours. Visualized tracheal air column is within normal limits. Negative visible bowel gas pattern. IMPRESSION: 1. Status post TAVR. Right IJ central line in place, tip at the lower SVC level. 2.  No acute cardiopulmonary abnormality. Electronically Signed   By: Genevie Ann M.D.   On: 04/15/2018 14:58    Cardiac Studies   TAVR OPERATIVE NOTE   Date of Procedure:                04/15/2018  Preoperative Diagnosis:      Severe Aortic Stenosis   Procedure:        Transcatheter Aortic Valve Replacement - Percutaneous  Transfemoral Approach             Edwards Sapien 3 THV (size 23 mm, model # 9600TFX, serial # [id number])              Co-Surgeons:                        Valentina Gu. Roxy Manns, MD and Sherren Mocha, MD  Pre-operative Echo Findings: ? Severe aortic stenosis ? Normal left ventricular systolic function  Post-operative Echo Findings: ? No paravalvular leak ? Normal/unchanged left ventricular systolic function  ___________________  2D ECHO 04/16/18: pending   Patient Profile     Janet Williamson is a 77 y.o. female with a history of embolic CVA, MAC, HTN, HLD, DMT2, PAF on Xarelto and severe AS who presented to [facility] on [facility]/15[facility]18 for TAVR.    Assessment & Plan    Severe AS: s/p successful TAVR with a 23 mm Edwards Sapien 3 THV via the TF approach on 04/15/18. Post operative echo pending. Groin sites are stable. ECG with NSR, LBBB (old) and no high grade heart block. Continue ASA 23m daily. We will resume home Xarelto at discharge. Remove central line. Possible discharge home today.   HTN: BP elevated, will resume home medications including BB.  DMT2: continue SSI while admitted.   HLD:  continue statin   Signed, KAngelena Form PA-C  04/16/2018, 11:06 AM  Pager [phone] Patient seen, examined. Available data reviewed. Agree with findings, assessment, and plan as outlined by KNell Range PA-C.  On my exam today the patient is alert, oriented, in no distress.  Lungs are clear, heart is regular rate and rhythm with a 2/6 systolic murmur at the right upper sternal border, no diastolic murmur, abdomen is  soft and nontender, extremities have no edema, bilateral groin sites are clear.  The patient's EKG shows normal sinus rhythm with a left bundle branch block pattern, unchanged from her preoperative study.  We reviewed post-TAVR restrictions and follow-up plan.  She appears stable for discharge today.  I did review her echocardiogram which demonstrates normal/vigorous LV systolic function, higher than expected transvalvular aortic gradients likely related to patient-prosthesis mismatch with a mean gradient of 22 mmHg.  There is no paravalvular regurgitation.  The patient is clinically improved and felt well with walking today without any shortness of breath.  She is advised to start back on Xarelto tonight.  Sherren Mocha, M.D. 04/16/2018 11:35 AM

## 2018-04-16 NOTE — Discharge Summary (Addendum)
[facility] TE[facility]   Discharge Summary    Patient ID: Janet Williamson,  MRN: [id number], DOB/AGE: 1940-10-21 77 y.o.  Admit date: 04/15/2018 Discharge date: 04/16/2018  Primary Care Provider: Kelton Pillar Primary Cardiologist: Dr. Marlou Porch / Dr. Burt Knack & Dr. Roxy Manns (TAVR)   Discharge Diagnoses    Principal Problem:   S/P TAVR (transcatheter aortic valve replacement) Active Problems:   DM (diabetes mellitus) ([place])   HTN (hypertension)   Anemia   Hypertension   History of stroke   PAF (paroxysmal atrial fibrillation) (H[facility])   Severe aortic stenosis   Allergies Allergies  Allergen Reactions  . Ace Inhibitors Other (See Comments)    angioedema  . Keflex [Cephalexin] Swelling and Other (See Comments)    lips  . Rifadin [Rifampin] Swelling and Other (See Comments)    lips     History of Present Illness     Janet Williamson is a 77 y.o. female with a history of embolic CVA, MAC, HTN, HLD, DMT2, PAF on Xarelto and severe AS who presented to [facility] on 04/15/18 for TAVR.   She was seen by Dr. 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 It was unclear if this was a fibroblastoma or papilloma or possibly dystrophic calcification and we decided to follow it.  She has been followed by Dr. Marlou Porch since that and has done well but over the past year has developed progressive shortness of breath and fatigue with activity. She had an echo on 02/26/2018 showing progression to severe aortic stenosis with a mean gradient of 47 mmHg and a peak gradient of 71 mmHg.  The dimensionless index was 0.34.  Left ventricular ejection fraction was 55 to 60%. She underwent cardiac catheterization on 03/06/2018 which showed no significant coronary disease. The peak to peak gradient across the aortic valve was 32 mmHg.  She was evaluated by Dr. Burt Knack and a repeat TEE was performed to evaluate the mitral valve  given her history of a mitral valve mass.  This showed no evidence of mitral valve mass or vegetation.  There is mild regurgitation. The leaflets were mildly thickened with mild mitral annular calcification.  The aortic valve was severely calcified and restricted with a mean gradient of 36 mmHg.   She was evaluated by the multidisciplinary valve team and deemed to be a good candidate for TAVR, which was set up for 04/15/2018.  Hospital Course     Consultants: none  Severe AS:s/p successful TAVR with a 23 mm Edwards Sapien 3 THV via the TF approach on 04/15/18. Post operative echo completed but not yet formally read.  Dr. Burt Knack has reviewed her echocardiogram which showed higher than expected transvalvular aortic gradients likely related to patient-prosthesis mismatch with a mean gradient of 22 mmHg.  There is no paravalvular regurgitation. Groin sites are stable. ECG with NSR, LBBB (old) and no high grade heart block. Continue ASA 69m daily. We will resume home Xarelto at tonight.  She will be discharged home today with TOC follow-up on 04/23/2018.   HTN: BP mildly elevated postoperatively.  Home antihypertensive regimen including amlodipine, HCTZ and Toprol has been resumed.  DMT2: She was treated with sliding scale insulin while admitted.  She will resume her home metformin XR 48 hours after contrast dye exposure which is 04/18/18 AM  HLD: continue statin   The patient has had an uncomplicated hospital course and  is recovering well. The femoral catheter sites are stable. She has been seen by Dr. Burt Knack today and deemed ready for discharge home. All follow-up appointments have been scheduled. Discharge medications are listed below.  _____________  Discharge Vitals Blood pressure (!) 147/60, pulse 61, temperature 99.2 F (37.3 C), temperature source Oral, resp. rate 14, height 5' 3"  (1.6 m), weight 191 lb 5.8 oz (86.8 kg), SpO2 92 %.  Filed Weights   04/15/18 0808 04/16/18 0437  Weight: 191  lb (86.6 kg) 191 lb 5.8 oz (86.8 kg)    Labs & Radiologic Studies     CBC Recent Labs    04/15/18 1549 04/16/18 0441  WBC  --  13.0*  HGB 8.2* 8.9*  HCT 24.0* 30.6*  MCV  --  70.5*  PLT  --  619   Basic Metabolic Panel Recent Labs    04/15/18 1549 04/16/18 0441  NA 139 136  K 3.9 3.4*  CL 100 98  CO2  --  27  GLUCOSE 180* 129*  BUN 7* 10  CREATININE 0.60 0.74  CALCIUM  --  9.4  MG  --  1.7   Liver Function Tests No results for input(s): AST, ALT, ALKPHOS, BILITOT, PROT, ALBUMIN in the last 72 hours. No results for input(s): LIPASE, AMYLASE in the last 72 hours. Cardiac Enzymes No results for input(s): CKTOTAL, CKMB, CKMBINDEX, TROPONINI in the last 72 hours. BNP Invalid input(s): POCBNP D-Dimer No results for input(s): DDIMER in the last 72 hours. Hemoglobin A1C No results for input(s): HGBA1C in the last 72 hours. Fasting Lipid Panel No results for input(s): CHOL, HDL, LDLCALC, TRIG, CHOLHDL, LDLDIRECT in the last 72 hours. Thyroid Function Tests No results for input(s): TSH, T4TOTAL, T3FREE, THYROIDAB in the last 72 hours.  Invalid input(s): FREET3  Dg Chest 2 View  Result Date: 04/11/2018 CLINICAL DATA:  Aortic valve replacement. EXAM: CHEST - 2 VIEW COMPARISON:  CT 04/09/2018. FINDINGS: Mediastinum and hilar structures normal. Cardiomegaly with normal pulmonary vascularity. No focal infiltrate. Mild left mid lung field subsegmental atelectasis. No pleural effusion or pneumothorax. Degenerative changes thoracic spine. IMPRESSION: 1. Mild left mid lung field subsegmental atelectasis. No acute infiltrate. 2.  Cardiomegaly with normal pulmonary vascularity Electronically Signed   By: Marcello Moores  Register   On: 04/11/2018 12:06   Ct Coronary Morph W/cta Cor Nancy Fetter W/ca W/cm &/or Wo/cm  Addendum Date: 04/10/2018   ADDENDUM REPORT: 04/10/2018 12:08 CLINICAL DATA:  77 year old female with severe aortic stenosis being evaluated for a TAVR procedure. EXAM: Cardiac TAVR CT  TECHNIQUE: The patient was scanned on a [employer]. A 120 kV retrospective scan was triggered in the descending thoracic aorta at 111 HU's. Gantry rotation speed was 250 msecs and collimation was .6 mm. No beta blockade or nitro were given. The 3D data set was reconstructed in 5% intervals of the R-R cycle. Systolic and diastolic phases were analyzed on a dedicated work station using MPR, MIP and VRT modes. The patient received 80 cc of contrast. FINDINGS: Aortic Valve: Trileaflet aortic valve with severely thickened and calcified leaflets and severely restricted leaflets opening, calculated AVA 1.3 cm2. Only trivial calcifications are extending into the LVOT. Aorta: Normal size, mild diffuse atheroma and calcifications. Sinotubular Junction: 30 x 29 mm Ascending Thoracic Aorta: 37 x 35 mm Aortic Arch: 28 x 24 mm Descending Thoracic Aorta: 25 x 23 mm Sinus of Valsalva Measurements: Non-coronary: 27 mm Right -coronary: 30 mm Left -coronary: 28 mm Sinus of Valsalva Height: Right -coronary: 22  mm Left -coronary: 18 mm Coronary Artery Height above Annulus: Left Main: 11 mm Right Coronary: 15 mm Virtual Basal Annulus Measurements: Maximum/Minimum Diameter: 23.8 x 21.0 mm Mean Diameter: 21.5 mm Perimeter: 69.3 mm Area:  362 mm2 Optimum Fluoroscopic Angle for Delivery: RAO 5 CRA 5 IMPRESSION: 1. Trileaflet aortic valve with severely thickened and calcified leaflets and severely restricted leaflets opening, calculated AVA 1.3 cm 2. Only trivial calcifications are extending into the LVOT. Annular measurements suitable for delivery of a 23 mm Edwards-SAPIEN 3 valve or a 26 mm Evolut R Medtronic valve. 2. Sufficient coronary to annulus distance. 3. Optimum Fluoroscopic Angle for Delivery:  RAO 5 CRA 5 4. No thrombus in the left atrial appendage. 5. Dilated pulmonary artery measuring 34 mm suggestive of pulmonary hypertension. Electronically Signed   By: Ena Dawley   On: 04/10/2018 12:08   Result Date:  04/10/2018 EXAM: OVER-READ INTERPRETATION  CT CHEST The following report is an over-read performed by radiologist Dr. Vinnie Langton of [facility], [place] on 04/09/2018. This over-read does not include interpretation of cardiac or coronary anatomy or pathology. The coronary calcium score/coronary CTA interpretation by the cardiologist is attached. COMPARISON:  Chest CT 10/29/2009. FINDINGS: Extracardiac findings will be described separately under dictation for contemporaneously obtained CTA chest, abdomen pelvis. IMPRESSION: Please see separate dictation for contemporaneously obtained CTA chest, abdomen and pelvis dated 04/09/2018 for full description of relevant extracardiac findings. Electronically Signed: By: Vinnie Langton M.D. On: 04/09/2018 13:50   Dg Chest Port 1 View  Result Date: 04/15/2018 CLINICAL DATA:  77 year old female postoperative day zero status post TAVR for severe aortic stenosis. EXAM: PORTABLE CHEST 1 VIEW COMPARISON:  Chest radiograph 04/11/2018 and earlier. FINDINGS: Portable AP semi upright view at 1439 hours. Right IJ central line in place with tip at the lower SVC level. No pneumothorax or pulmonary edema. Mildly lower lung volumes. No confluent pulmonary opacity. Prosthetic aortic valve in place. Stable cardiomegaly and mediastinal contours. Visualized tracheal air column is within normal limits. Negative visible bowel gas pattern. IMPRESSION: 1. Status post TAVR. Right IJ central line in place, tip at the lower SVC level. 2.  No acute cardiopulmonary abnormality. Electronically Signed   By: Genevie Ann M.D.   On: 04/15/2018 14:58   Ct Angio Chest Aorta W &/or Wo Contrast  Result Date: 04/09/2018 CLINICAL DATA:  77 year old female with history of severe aortic valve stenosis. Preprocedural study prior to potential transcatheter aortic valve replacement (TAVR) procedure. EXAM: CT ANGIOGRAPHY CHEST, ABDOMEN AND PELVIS TECHNIQUE: Multidetector CT imaging through the chest, abdomen  and pelvis was performed using the standard protocol during bolus administration of intravenous contrast. Multiplanar reconstructed images and MIPs were obtained and reviewed to evaluate the vascular anatomy. CONTRAST:  60m ISOVUE-370 IOPAMIDOL (ISOVUE-370) INJECTION 76% COMPARISON:  None. FINDINGS: CTA CHEST FINDINGS Cardiovascular: Mild cardiomegaly. There is no significant pericardial fluid, thickening or pericardial calcification. Aortic atherosclerosis. No definite coronary artery calcifications. Severe thickening calcification of the aortic valve. Mild calcification of the mitral annulus. Mediastinum/Lymph Nodes: No pathologically enlarged mediastinal or hilar lymph nodes. Esophagus is unremarkable in appearance. Heterogeneous appearance of the thyroid gland, presumably reflective of a multinodular goiter. No axillary lymphadenopathy. Lungs/Pleura: Patchy multifocal ground-glass attenuation in the lungs bilaterally, favored to reflect a background of very mild interstitial pulmonary edema. No consolidative airspace disease. No pleural effusions. No suspicious appearing pulmonary nodules or masses. Musculoskeletal/Soft Tissues: There are no aggressive appearing lytic or blastic lesions noted in the visualized portions of the skeleton. CTA ABDOMEN  AND PELVIS FINDINGS Hepatobiliary: No suspicious cystic or solid hepatic lesions. No intra or extrahepatic biliary ductal dilatation. Gallbladder is normal in appearance. Pancreas: No pancreatic mass. No pancreatic ductal dilatation. No pancreatic or peripancreatic fluid or inflammatory changes. Spleen: Unremarkable. Adrenals/Urinary Tract: Mild multifocal cortical thinning in the kidneys bilaterally. No suspicious renal lesions. No hydroureteronephrosis. Bilateral adrenal glands are normal in appearance. Urinary bladder is normal in appearance. Stomach/Bowel: Normal appearance of the stomach. No pathologic dilatation of small bowel or colon. Normal appendix.  Vascular/Lymphatic: Aortic atherosclerosis, without evidence of aneurysm or dissection in the abdominal or pelvic vasculature. Vascular findings and measurements pertinent to potential TAVR procedure, as detailed below. Celiac axis, superior mesenteric artery and inferior mesenteric artery are all widely patent without hemodynamically significant stenosis. Single renal arteries bilaterally are widely patent without hemodynamically significant stenosis. No lymphadenopathy noted in the abdomen or pelvis. Reproductive: Status post hysterectomy. Ovaries are not confidently identified may be surgically absent or atrophic. Other: Trace volume of ascites in the low anatomic pelvis. No pneumoperitoneum. Musculoskeletal: There are no aggressive appearing lytic or blastic lesions noted in the visualized portions of the skeleton. VASCULAR MEASUREMENTS PERTINENT TO TAVR: AORTA: Minimal Aortic Diameter-15 x 15 mm Severity of Aortic Calcification-mild RIGHT PELVIS: Right Common Iliac Artery - Minimal Diameter-10.2 x 9.8 mm Tortuosity-mild Calcification-mild Right External Iliac Artery - Minimal Diameter-8.1 x 7.3 mm Tortuosity-mild Calcification-none Right Common Femoral Artery - Minimal Diameter-7.6 x 7.9 mm Tortuosity-mild Calcification - none LEFT PELVIS: Left Common Iliac Artery - Minimal Diameter-10.1 x 9.6 mm Tortuosity-mild Calcification - none Left External Iliac Artery - Minimal Diameter-6.6 x 6.9 mm Tortuosity-mild Calcification - none Left Common Femoral Artery - Minimal Diameter-7.1 x 7.3 mm Tortuosity-mild Calcification - none Review of the MIP images confirms the above findings. IMPRESSION: 1. Aortic atherosclerosis with vascular findings and measurements pertinent to potential TAVR procedure, as detailed above. 2. Severe thickening calcification of the aortic valve, compatible with the reported clinical history of severe aortic stenosis. 3. Cardiomegaly with what appears to be mild interstitial pulmonary edema,  which may suggest mild congestive heart failure. 4. Additional incidental findings, as above. Electronically Signed   By: Vinnie Langton M.D.   On: 04/09/2018 16:52   Ct Angio Abdomen Pelvis  W &/or Wo Contrast  Result Date: 04/09/2018 CLINICAL DATA:  77 year old female with history of severe aortic valve stenosis. Preprocedural study prior to potential transcatheter aortic valve replacement (TAVR) procedure. EXAM: CT ANGIOGRAPHY CHEST, ABDOMEN AND PELVIS TECHNIQUE: Multidetector CT imaging through the chest, abdomen and pelvis was performed using the standard protocol during bolus administration of intravenous contrast. Multiplanar reconstructed images and MIPs were obtained and reviewed to evaluate the vascular anatomy. CONTRAST:  83m ISOVUE-370 IOPAMIDOL (ISOVUE-370) INJECTION 76% COMPARISON:  None. FINDINGS: CTA CHEST FINDINGS Cardiovascular: Mild cardiomegaly. There is no significant pericardial fluid, thickening or pericardial calcification. Aortic atherosclerosis. No definite coronary artery calcifications. Severe thickening calcification of the aortic valve. Mild calcification of the mitral annulus. Mediastinum/Lymph Nodes: No pathologically enlarged mediastinal or hilar lymph nodes. Esophagus is unremarkable in appearance. Heterogeneous appearance of the thyroid gland, presumably reflective of a multinodular goiter. No axillary lymphadenopathy. Lungs/Pleura: Patchy multifocal ground-glass attenuation in the lungs bilaterally, favored to reflect a background of very mild interstitial pulmonary edema. No consolidative airspace disease. No pleural effusions. No suspicious appearing pulmonary nodules or masses. Musculoskeletal/Soft Tissues: There are no aggressive appearing lytic or blastic lesions noted in the visualized portions of the skeleton. CTA ABDOMEN AND PELVIS FINDINGS Hepatobiliary: No suspicious cystic or  solid hepatic lesions. No intra or extrahepatic biliary ductal dilatation. Gallbladder is  normal in appearance. Pancreas: No pancreatic mass. No pancreatic ductal dilatation. No pancreatic or peripancreatic fluid or inflammatory changes. Spleen: Unremarkable. Adrenals/Urinary Tract: Mild multifocal cortical thinning in the kidneys bilaterally. No suspicious renal lesions. No hydroureteronephrosis. Bilateral adrenal glands are normal in appearance. Urinary bladder is normal in appearance. Stomach/Bowel: Normal appearance of the stomach. No pathologic dilatation of small bowel or colon. Normal appendix. Vascular/Lymphatic: Aortic atherosclerosis, without evidence of aneurysm or dissection in the abdominal or pelvic vasculature. Vascular findings and measurements pertinent to potential TAVR procedure, as detailed below. Celiac axis, superior mesenteric artery and inferior mesenteric artery are all widely patent without hemodynamically significant stenosis. Single renal arteries bilaterally are widely patent without hemodynamically significant stenosis. No lymphadenopathy noted in the abdomen or pelvis. Reproductive: Status post hysterectomy. Ovaries are not confidently identified may be surgically absent or atrophic. Other: Trace volume of ascites in the low anatomic pelvis. No pneumoperitoneum. Musculoskeletal: There are no aggressive appearing lytic or blastic lesions noted in the visualized portions of the skeleton. VASCULAR MEASUREMENTS PERTINENT TO TAVR: AORTA: Minimal Aortic Diameter-15 x 15 mm Severity of Aortic Calcification-mild RIGHT PELVIS: Right Common Iliac Artery - Minimal Diameter-10.2 x 9.8 mm Tortuosity-mild Calcification-mild Right External Iliac Artery - Minimal Diameter-8.1 x 7.3 mm Tortuosity-mild Calcification-none Right Common Femoral Artery - Minimal Diameter-7.6 x 7.9 mm Tortuosity-mild Calcification - none LEFT PELVIS: Left Common Iliac Artery - Minimal Diameter-10.1 x 9.6 mm Tortuosity-mild Calcification - none Left External Iliac Artery - Minimal Diameter-6.6 x 6.9 mm  Tortuosity-mild Calcification - none Left Common Femoral Artery - Minimal Diameter-7.1 x 7.3 mm Tortuosity-mild Calcification - none Review of the MIP images confirms the above findings. IMPRESSION: 1. Aortic atherosclerosis with vascular findings and measurements pertinent to potential TAVR procedure, as detailed above. 2. Severe thickening calcification of the aortic valve, compatible with the reported clinical history of severe aortic stenosis. 3. Cardiomegaly with what appears to be mild interstitial pulmonary edema, which may suggest mild congestive heart failure. 4. Additional incidental findings, as above. Electronically Signed   By: Vinnie Langton M.D.   On: 04/09/2018 16:52     Diagnostic Studies/Procedures    TAVR OPERATIVE NOTE   Date of Procedure:04/15/2018  Preoperative Diagnosis:Severe Aortic Stenosis   Procedure:   Transcatheter Aortic Valve Replacement - Percutaneous Transfemoral Approach Edwards Sapien 3 THV (size 89m, model # 9600TFX, serial # [id number]  Co-Surgeons:Clarence H. ORoxy Manns MD and MSherren Mocha MD  Pre-operative Echo Findings: ? Severe aortic stenosis ? Normalleft ventricular systolic function  Post-operative Echo Findings: ? Noparavalvular leak ? Normal/unchangedleft ventricular systolic function  ___________________   2D ECHO 04/16/18: formal read pending     Disposition   Pt is being discharged home today in good condition.  Follow-up Plans & Appointments    Follow-up Information    TEileen Stanford PA-C. Go on 04/23/2018.   Specialties:  Cardiology, Radiology Why:  @ 1:30pm Contact information: [id number] [phone]           Discharge Medications     Medication List    TAKE these medications   amLODipine 5 MG tablet Commonly known as:  NORVASC Take 5 mg by mouth daily.   hydrochlorothiazide 25  MG tablet Commonly known as:  HYDRODIURIL Take 25 mg by mouth daily.   metFORMIN 750 MG 24 hr tablet Commonly known as:  GLUCOPHAGE-XR TAKE 2 TABLETS BY MOUTH ONCE DAILY WITH  BREAKFAST   metoprolol succinate 50 MG 24 hr tablet Commonly known as:  TOPROL-XL Take 50 mg by mouth daily.   rivaroxaban 20 MG Tabs tablet Commonly known as:  XARELTO Take 1 tablet (20 mg total) daily with supper by mouth.   simvastatin 20 MG tablet Commonly known as:  ZOCOR Take 20 mg by mouth every evening.   SYSTANE OP Place 1 drop into both eyes 2 (two) times daily.   Vitamin D3 5000 units Caps Take 5,000 Units by mouth daily.      ADDENDUM: ASA 62m daily should also be on this list. Patient is aware she will be taking ASA 836mdaily with her Xarelto     Outstanding Labs/Studies   none  Duration of Discharge Encounter   Greater than 30 minutes including physician time.  Signed, KaAngelena FormA-C 04/16/2018, 11:51 AM

## 2018-04-16 NOTE — Progress Notes (Signed)
  Echocardiogram 2D Echocardiogram has been performed.  Janet Williamson 04/16/2018, 9:24 AM

## 2018-04-16 NOTE — Discharge Instructions (Signed)
°  You can resume your Xarelto tonight (04/16/2018) and metformin on 04/18/2018 starting with your p.m. Dose.   ACTIVITY AND EXERCISE  Daily activity and exercise are an important part of your recovery. People recover at different rates depending on their general health and type of valve procedure.  Most people recovering from TAVR feel better relatively quickly   No lifting, pushing, pulling more than 10 pounds (examples to avoid: groceries, vacuuming, gardening, golfing):             - For one week with a procedure through the groin.             - For six weeks for procedures through the chest wall.             - For three months for procedures through the breast-bone. NOTE: You will typically see one of our providers 7-10 days after your procedure to discuss WHEN TO RESUME the above activities.    DRIVING  Do not drive for until you are seen for follow up and cleared by a provider.  If you have been told by your doctor in the past that you may not drive, you must talk with him/her before you begin driving again.   DRESSING  Groin site: you may leave the clear dressing over the site for up to one week or until it falls off.   HYGIENE  If you had a femoral (leg) procedure, you may take a shower when you return home. After the shower, pat the site dry. Do NOT use powder, oils or lotions in your groin area until the site has completely healed.  If you had a chest procedure, you may shower when you return home unless specifically instructed not to by your discharging practitioner.             - DO NOT scrub incision; pat dry with a towel             - DO NOT apply any lotions, oils, powders to the incision             - No tub baths / swimming for at least 2 weeks.  If you notice any fevers, chills, increased pain, swelling, bleeding or pus, please contact your doctor.  ADDITIONAL INFORMATION  If you are going to have an upcoming dental procedure, please contact our office  as you will require antibiotics ahead of time to prevent infection on your heart valve.

## 2018-04-16 NOTE — Progress Notes (Signed)
IJ removed per order. Pressure dressing applied. Catheter intact. Lacy Duverney, RN

## 2018-04-16 NOTE — Progress Notes (Signed)
Called and spoke with Mitzi Davenport RN who was aware that the patient has a DC central line order

## 2018-04-16 NOTE — Progress Notes (Signed)
CARDIAC REHAB PHASE I   PRE:  Rate/Rhythm: 79 SR   BP:  Sitting: 147/60      SaO2: 96 RA  MODE:  Ambulation: 240 ft 96 peak HR  POST:  Rate/Rhythm: 82 SR  BP:  Sitting: 162/60    SaO2: 97 RA   Pt ambulated 237ft in hallway standby assist with slow steady gait. Pt denies CP or SOB. States she can already tell a difference from pre-op activity level. Pt going for echo, will continue to follow and educate.  [id number] Reynold Bowen, RN BSN 04/16/2018 8:45 AM

## 2018-04-16 NOTE — Progress Notes (Signed)
Reviewed restrictions with pt. Pt understands needs for showering daily and monitoring incisions. Pt instructed to keep areas clean and dry. Offered exercise guidelines and CRP II, pt declining at this time.   Reynold Bowen, RN BSN 04/16/2018 10:17 AM

## 2018-04-16 NOTE — Progress Notes (Signed)
Potassium level is 3.4 today, Cooper MD notified, verbal order received for potasium PO one time, will continue to monitor.

## 2018-04-16 NOTE — Progress Notes (Signed)
Patient in a stable condition, discharge education reviewed with patient and her family at bedside, they verbalized understanding, iv removed, tele dc ccmd  notified, patient belongings at bedside, patient's family to transport patient home.

## 2018-04-16 NOTE — Progress Notes (Deleted)
  Entered in error. Please see DC summary.   Cline Crock PA-C  [facility]

## 2018-04-16 NOTE — Care Management Note (Signed)
Case Management Note Donn Pierini RN, BSN Unit 4E-Case Manager [id number]  Patient Details  Name: Janet Williamson MRN: [id number] Date of Birth: 1941-09-15  Subjective/Objective:  Pt admitted s/p TAVR                  Action/Plan: PTA pt lived at home with daughter- plan to return home- no CM needs noted for transition home.   Expected Discharge Date:  04/16/18               Expected Discharge Plan:  Home/Self Care  In-House Referral:  NA  Discharge planning Services  CM Consult  Post Acute Care Choice:  NA Choice offered to:  NA  DME Arranged:    DME Agency:     HH Arranged:    HH Agency:     Status of Service:  Completed, signed off  If discussed at Long Length of Stay Meetings, dates discussed:    Discharge Disposition: home/self care   Additional Comments:  Darrold Span, RN 04/16/2018, 12:03 PM

## 2018-04-17 ENCOUNTER — Telehealth: Payer: Self-pay | Admitting: Physician Assistant

## 2018-04-17 ENCOUNTER — Encounter: Payer: Medicare Other | Admitting: Surgery

## 2018-04-17 LAB — TYPE AND SCREEN
ABO/RH(D): O POS
Antibody Screen: POSITIVE
DAT, IgG: NEGATIVE
Donor AG Type: NEGATIVE
Donor AG Type: NEGATIVE
Unit division: 0
Unit division: 0

## 2018-04-17 LAB — BPAM RBC
BLOOD PRODUCT EXPIRATION DATE: 201908092359
Blood Product Expiration Date: 201908092359
UNIT TYPE AND RH: 5100
Unit Type and Rh: 5100

## 2018-04-17 MED ORDER — ASPIRIN EC 81 MG PO TBEC: 81.0000 mg | DELAYED_RELEASE_TABLET | Freq: Every day | ORAL | Status: DC

## 2018-04-17 NOTE — Telephone Encounter (Signed)
  [facility]   MULTIDISCIPLINARY HEART VALVE TEAM   Patient contacted regarding discharge from [facility] on 04/16/18  Patient understands to follow up with provider Carlean Jews on 7/17 @ 1:30pm at [address].  Patient understands discharge instructions? yes Patient understands medications and regiment? yes Patient understands to bring all medications to this visit? yes  Cline Crock PA-C  [facility]

## 2018-04-22 NOTE — Progress Notes (Addendum)
[facility] [place]                                       Cardiology Office Note    Date:  04/23/2018   ID:  Janet Williamson, DOB [birth year], MRN [id number]  PCP:  Kelton Pillar, MD  Cardiologist:  Dr. Marlou Porch / Dr. Burt Knack & Dr. Roxy Manns (TAVR)  CC: TOC s/p TAVR  History of Present Illness:  Janet Williamson is a 77 y.o. female with a history of embolic CVA, MAC, LBBB, HTN, HLD, DMT2, PAF on Xarelto and severe AS s/p TAVR (04/15/18) who presents to clinic for follow up.    She was seen by Dr. Cyndia Bent in 2008 when she presented with an embolic stroke and was found to have a mass on her mitral valve.It was unclear if this was a fibroblastoma or papilloma or possibly dystrophic calcification and we decided to follow it. She has been followed by Dr. Marlou Porch since that and has done well but over the past year has developed progressive shortness of breath and fatigue with activity.She had an echo on 02/26/2018 showing progression to severe aortic stenosis with a mean gradient of 47 mmHg and a peak gradient of 71 mmHg. The dimensionless index was 0.34. Left ventricular ejection fraction was 55 to 60%.She underwent cardiac catheterization on 03/06/2018 which showed no significant coronary disease. The peak to peak gradient across the aortic valve was 32 mmHg. She was evaluated by Dr. Burt Knack and a repeat TEE was performed to evaluate the mitral valve given her history of a mitral valve mass. This showed no evidence of mitral valve mass or vegetation. There is mild regurgitation. The leaflets were mildly thickened with mild mitral annular calcification. The aortic valve was severely calcified and restricted with a mean gradient of 36 mmHg.   She underwent successful TAVR with a9m Edwards Sapien 3 THV via the TF approach on 04/15/18. Post operative echo showed higher than expected transvalvular aortic gradients likely related to patient-prosthesis mismatch  with a mean gradient of 22 mmHg with no paravalvular regurgitation. She was discharged the following day on Asprin 81 mg daily and home Xarelto .   Today she presents to clinic for follow up.No CP or SOB. No LE edema, orthopnea or PND. No dizziness or syncope. No blood in stool or urine. No palpitations. She has been walking around the house for exercise and can tell a big difference in her breathing.     Past Medical History:  Diagnosis Date  . Anemia   . Cardiac mass    a. 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 Not seen on most recent TEE  . Cardiomyopathy in other disease   . Cataracts, bilateral   . Diabetes mellitus    Type 2  . Generalized osteoarthritis   . GERD (gastroesophageal reflux disease)   . HLD (hyperlipidemia)   . Hypertension   . Hypothyroidism   . PAF ([place]oxysmal atrial fibrillation) ([place]    a. s/p DCCV 06/2017, on Xarelto  . Phlebitis   . S/P TAVR (transcatheter aortic valve replacement) 04/15/2018   Edwards Sapien 3 THV (size 23 mm, model # 9600TFX, serial # [id number] via the TF approach  . Severe aortic stenosis    a. s/p TAVR  . Stroke ([facility] 2008    Past Surgical History:  Procedure Laterality Date  . ABDOMINAL HYSTERECTOMY    .  COLONOSCOPY    . EYE SURGERY Bilateral    cataract removal  . RIGHT/LEFT HEART CATH AND CORONARY ANGIOGRAPHY N/A 03/06/2018   Procedure: RIGHT/LEFT HEART CATH AND CORONARY ANGIOGRAPHY;  Surgeon: Belva Crome, MD;  Location: [place] CV [facility];  Service: Cardiovascular;  Laterality: N/A;  . TEE WITHOUT CARDIOVERSION N/A 04/01/2018   Procedure: TRANSESOPHAGEAL ECHOCARDIOGRAM (TEE);  Surgeon: Jerline Pain, MD;  Location: [facility] ENDOSCOPY;  Service: Cardiovascular;  Laterality: N/A;  . TEE WITHOUT CARDIOVERSION N/A 04/15/2018   Procedure: TRANSESOPHAGEAL ECHOCARDIOGRAM (TEE);  Surgeon: Sherren Mocha, MD;  Location: [place];  Service: Open Heart Surgery;  Laterality: N/A;  . TOTAL KNEE ARTHROPLASTY Right 04/14/2013   Dr Marlou Sa  . TOTAL  KNEE ARTHROPLASTY Right 04/14/2013   Procedure: TOTAL KNEE ARTHROPLASTY;  Surgeon: Meredith Pel, MD;  Location: [place];  Service: Orthopedics;  Laterality: Right;  . TRANSCATHETER AORTIC VALVE REPLACEMENT, TRANSFEMORAL  04/15/2018  . TRANSCATHETER AORTIC VALVE REPLACEMENT, TRANSFEMORAL Bilateral 04/15/2018   Procedure: TRANSCATHETER AORTIC VALVE REPLACEMENT, TRANSFEMORAL;  Surgeon: Sherren Mocha, MD;  Location: [place];  Service: Open Heart Surgery;  Laterality: Bilateral;    Current Medications: Outpatient Medications Prior to Visit  Medication Sig Dispense Refill  . amLODipine (NORVASC) 5 MG tablet Take 5 mg by mouth daily.    Marland Kitchen aspirin EC 81 MG tablet Take 1 tablet (81 mg total) by mouth daily.    . Cholecalciferol (VITAMIN D3) 5000 units CAPS Take 5,000 Units by mouth daily.    . hydrochlorothiazide (HYDRODIURIL) 25 MG tablet Take 25 mg by mouth daily.    . metFORMIN (GLUCOPHAGE-XR) 750 MG 24 hr tablet TAKE 2 TABLETS BY MOUTH ONCE DAILY WITH BREAKFAST 60 tablet 3  . metoprolol succinate (TOPROL-XL) 50 MG 24 hr tablet Take 50 mg by mouth daily.     Vladimir Faster Glycol-Propyl Glycol (SYSTANE OP) Place 1 drop into both eyes 2 (two) times daily.     . rivaroxaban (XARELTO) 20 MG TABS tablet Take 1 tablet (20 mg total) daily with supper by mouth. 30 tablet 11  . simvastatin (ZOCOR) 20 MG tablet Take 20 mg by mouth every evening.     No facility-administered medications prior to visit.      Allergies:   Ace inhibitors; Keflex [cephalexin]; and Rifadin [rifampin]   Social History   Socioeconomic History  . Marital status: Widowed    Spouse name: Not on file  . Number of children: Not on file  . Years of education: Not on file  . Highest education level: Not on file  Occupational History  . Not on file  Social Needs  . Financial resource strain: Not on file  . Food insecurity:    Worry: Not on file    Inability: Not on file  . Transportation needs:    Medical: Not on file     Non-medical: Not on file  Tobacco Use  . Smoking status: Never Smoker  . Smokeless tobacco: Never Used  Substance and Sexual Activity  . Alcohol use: No  . Drug use: No  . Sexual activity: Not Currently  Lifestyle  . Physical activity:    Days per week: Not on file    Minutes per session: Not on file  . Stress: Not on file  Relationships  . Social connections:    Talks on phone: Not on file    Gets together: Not on file    Attends religious service: Not on file    Active member of club or  organization: Not on file    Attends meetings of clubs or organizations: Not on file    Relationship status: Not on file  Other Topics Concern  . Not on file  Social History Narrative  . Not on file     Family History:  The patient's family history includes Hypertension in her father and mother; Stroke in her mother.      ROS:   Please see the history of present illness.    ROS All other systems reviewed and are negative.   PHYSICAL EXAM:   VS:  BP 110/60   Pulse 76   Ht _0  (1.6 m)   Wt 189 lb 12.8 oz (86.1 kg)   SpO2 98%   BMI 33.62 kg/m    GEN: Well nourished, well developed, in no acute distress  HEENT: normal  Neck: no JVD, carotid bruits, or masses Cardiac: RRR; 2/6 SEM @ RUSB. No rubs, or gallops,no edema  Respiratory:  clear to auscultation bilaterally, normal work of breathing GI: soft, nontender, nondistended, + BS MS: no deformity or atrophy  Skin: warm and dry, no rash.  Groin sites healing well with no hematoma or ecchymosis Neuro:  Alert and Oriented x 3, Strength and sensation are intact Psych: euthymic mood, full affect    Wt Readings from Last 3 Encounters:  04/23/18 189 lb 12.8 oz (86.1 kg)  04/16/18 191 lb 5.8 oz (86.8 kg)  04/11/18 191 lb 2 oz (86.7 kg)      Studies/Labs Reviewed:   EKG:  EKG is ordered today.  The ekg ordered today demonstrates NSR with old LBBB HR 76  Recent Labs: 04/11/2018: ALT 8; B Natriuretic Peptide 74.6 04/16/2018: BUN  10; Creatinine, Ser 0.74; Hemoglobin 8.9; Magnesium 1.7; Platelets 397; Potassium 3.4; Sodium 136   Lipid Panel    Component Value Date/Time   CHOL 121 04/23/2017 1022   TRIG 84.0 04/23/2017 1022   HDL 38.10 (L) 04/23/2017 1022   CHOLHDL 3 04/23/2017 1022   VLDL 16.8 04/23/2017 1022   LDLCALC 66 04/23/2017 1022    Additional studies/ records that were reviewed today include:   TAVR OPERATIVE NOTE   Date of Procedure:04/15/2018  Preoperative Diagnosis:Severe Aortic Stenosis   Procedure:   Transcatheter Aortic Valve Replacement - Percutaneous Transfemoral Approach Edwards Sapien 3 THV (size 48m, model # 9600TFX, serial # [id number]  Co-Surgeons:Clarence H. ORoxy Manns MD and MSherren Mocha MD  Pre-operative Echo Findings: ? Severe aortic stenosis ? Normalleft ventricular systolic function  Post-operative Echo Findings: ? Noparavalvular leak ? Normal/unchangedleft ventricular systolic function  ___________________   2D ECHO 04/16/18 Study Conclusions - Left ventricle: The cavity size was normal. Wall thickness was   increased in a pattern of moderate LVH. Systolic function was   normal. The estimated ejection fraction was in the range of 60%   to 65%. Wall motion was normal; there were no regional wall   motion abnormalities. Features are consistent with a pseudonormal   left ventricular filling pattern, with concomitant abnormal   relaxation and increased filling pressure (grade 2 diastolic   dysfunction). - Aortic valve: A prosthesis was present and functioning normally.   The prosthesis had a normal range of motion. The sewing ring   appeared normal, had no rocking motion, and showed no evidence of   dehiscence. There was trivial regurgitation. Mean gradient (S):   23 mm Hg. Peak gradient (S): 35 mm Hg. Valve area (VTI): 2.15   cm^2. Valve area (Vmax): 2.08 cm^2. Valve area (Vmean):  1.88    cm^2. - Mitral valve: Calcified annulus. Mildly thickened leaflets . - Left atrium: The atrium was moderately dilated. - Pulmonary arteries: PA peak pressure: 40 mm Hg (S). Impressions: - Post TAVR with well seated bioprosthesis.     ASSESSMENT & PLAN:   Severe AS s/p TAVR: doing excellent after TAVR. Groin sites are stable. ECG with NSR and old LBBB. SBE prophylaxis discussed; she has full dentures. Continue ASA and Xarelto.  I will see her back at 1 month for echo and follow up    HTN: BP well controlled today. Continue current regimen   HLD: continue statin   PAF: maintaining NSR by ECG. Continue Xarelto   Medication Adjustments/Labs and Tests Ordered: Current medicines are reviewed at length with the patient today.  Concerns regarding medicines are outlined above.  Medication changes, Labs and Tests ordered today are listed in the Patient Instructions below. Patient Instructions  Medication Instructions:   Your physician recommends that you continue on your current medications as directed. Please refer to the Current Medication list given to you today.   If you need a refill on your cardiac medications before your next appointment, please call your pharmacy.  Labwork: NONE ORDERED  TODAY    Testing/Procedures: NONE ORDERED  TODAY    Follow-Up:  AS  SCHEDULED    Any Other Special Instructions Will Be Listed Below (If Applicable).                                                                                                                                                    Signed, Angelena Form, PA-C  04/23/2018 1:57 PM    [facility] [address], [place], [place]  [address] Phone: [phone]; Fax: [phone]

## 2018-04-23 ENCOUNTER — Ambulatory Visit (INDEPENDENT_AMBULATORY_CARE_PROVIDER_SITE_OTHER): Payer: Medicare Other | Admitting: Physician Assistant

## 2018-04-23 ENCOUNTER — Encounter: Payer: Self-pay | Admitting: Physician Assistant

## 2018-04-23 VITALS — BP 110/60 | HR 76 | Ht 63.0 in | Wt 189.8 lb

## 2018-04-23 DIAGNOSIS — E785 Hyperlipidemia, unspecified: Secondary | ICD-10-CM

## 2018-04-23 DIAGNOSIS — Z952 Presence of prosthetic heart valve: Secondary | ICD-10-CM

## 2018-04-23 DIAGNOSIS — I480 Paroxysmal atrial fibrillation: Secondary | ICD-10-CM

## 2018-04-23 DIAGNOSIS — I10 Essential (primary) hypertension: Secondary | ICD-10-CM

## 2018-04-23 NOTE — Patient Instructions (Signed)
Medication Instructions:   Your physician recommends that you continue on your current medications as directed. Please refer to the Current Medication list given to you today.   If you need a refill on your cardiac medications before your next appointment, please call your pharmacy.  Labwork: NONE ORDERED  TODAY    Testing/Procedures: NONE ORDERED  TODAY   Follow-Up:   AS SCHEDULED   Any Other Special Instructions Will Be Listed Below (If Applicable).                                                                                                                                                   

## 2018-04-24 ENCOUNTER — Other Ambulatory Visit (INDEPENDENT_AMBULATORY_CARE_PROVIDER_SITE_OTHER): Payer: Medicare Other

## 2018-04-24 DIAGNOSIS — E042 Nontoxic multinodular goiter: Secondary | ICD-10-CM

## 2018-04-24 DIAGNOSIS — E119 Type 2 diabetes mellitus without complications: Secondary | ICD-10-CM

## 2018-04-24 LAB — COMPREHENSIVE METABOLIC PANEL
ALBUMIN: 3.5 g/dL (ref 3.5–5.2)
ALK PHOS: 54 U/L (ref 39–117)
ALT: 8 U/L (ref 0–35)
AST: 11 U/L (ref 0–37)
BILIRUBIN TOTAL: 0.3 mg/dL (ref 0.2–1.2)
BUN: 12 mg/dL (ref 6–23)
CO2: 32 mEq/L (ref 19–32)
CREATININE: 0.77 mg/dL (ref 0.40–1.20)
Calcium: 9.6 mg/dL (ref 8.4–10.5)
Chloride: 98 mEq/L (ref 96–112)
GFR: 93.39 mL/min (ref >60.00)
Glucose, Bld: 119 mg/dL — ABNORMAL HIGH (ref 70–99)
Potassium: 3.5 mEq/L (ref 3.5–5.1)
SODIUM: 137 meq/L (ref 135–145)
TOTAL PROTEIN: 7.8 g/dL (ref 6.0–8.3)

## 2018-04-24 LAB — TSH: TSH: 1.09 u[IU]/mL (ref 0.35–4.50)

## 2018-04-24 LAB — HEMOGLOBIN A1C: HEMOGLOBIN A1C: 6.9 % — AB (ref 4.6–6.5)

## 2018-04-28 ENCOUNTER — Encounter: Payer: Self-pay | Admitting: Endocrinology

## 2018-04-28 ENCOUNTER — Ambulatory Visit (INDEPENDENT_AMBULATORY_CARE_PROVIDER_SITE_OTHER): Payer: Medicare Other | Admitting: Endocrinology

## 2018-04-28 VITALS — BP 138/70 | HR 78 | Ht 63.0 in | Wt 190.2 lb

## 2018-04-28 DIAGNOSIS — E119 Type 2 diabetes mellitus without complications: Secondary | ICD-10-CM

## 2018-04-28 LAB — GLUCOSE, POCT (MANUAL RESULT ENTRY): POC GLUCOSE: 124 mg/dL — AB (ref 70–99)

## 2018-04-28 NOTE — Progress Notes (Signed)
Patient ID: Janet Williamson, female   DOB: 1941/04/22, 77 y.o.   MRN: [id number]   Reason for Appointment: Diabetes follow-up   History of Present Illness   Type 2 DIABETES MELITUS, date of diagnosis 02/2012     She has had mild diabetes which has been well controlled with metformin ER alone. Has had no side effects from metformin  She also has had diabetes education in 2013 . Oral hypoglycemic drugs: Metformin 1.5 g        Side effects from medications: None  Her A1c tends to be higher than expected for her blood sugars However it is quite stable at 6.9, previously 6.8    Current management, blood sugar patterns and problems identified:  She is here for her 45-month follow-up visit  She has not brought her monitor for download   Her blood sugars are reportedly fairly good at home but usually checks infrequently  She is leaving off high fat foods recently and her weight is leveled off  She does try to walk as much as tolerated especially with the hot weather  No side effects with her metformin ER 1500 mg   Monitors blood glucose:  less than once a day     Glucometer:  Accu-Chek  Blood Glucose readings by recall at home from 95-140      Previous visits with dietitian: Several years ago  Dinner At 5-6 pm       Physical activity: exercise: walking  Wt Readings from Last 3 Encounters:  04/28/18 190 lb 3.2 oz (86.3 kg)  04/23/18 189 lb 12.8 oz (86.1 kg)  04/16/18 191 lb 5.8 oz (86.8 kg)     Lab Results  Component Value Date   HGBA1C 6.9 (H) 04/24/2018   HGBA1C 6.5 (H) 04/11/2018   HGBA1C 6.8 (H) 08/22/2017   Lab Results  Component Value Date   MICROALBUR <0.7 04/23/2017   LDLCALC 66 04/23/2017   CREATININE 0.77 04/24/2018     Other active problems evaluated today: See review of systems   LABS:  Office Visit on 04/28/2018  Component Date Value Ref Range Status  . POC Glucose 04/28/2018 124* 70 - 99 mg/dl Final  Lab on 76/19/5093  Component  Date Value Ref Range Status  . TSH 04/24/2018 1.09  0.35 - 4.50 uIU/mL Final  . Sodium 04/24/2018 137  135 - 145 mEq/L Final  . Potassium 04/24/2018 3.5  3.5 - 5.1 mEq/L Final  . Chloride 04/24/2018 98  96 - 112 mEq/L Final  . CO2 04/24/2018 32  19 - 32 mEq/L Final  . Glucose, Bld 04/24/2018 119* 70 - 99 mg/dL Final  . BUN 26/71/2458 12  6 - 23 mg/dL Final  . Creatinine, Ser 04/24/2018 0.77  0.40 - 1.20 mg/dL Final  . Total Bilirubin 04/24/2018 0.3  0.2 - 1.2 mg/dL Final  . Alkaline Phosphatase 04/24/2018 54  39 - 117 U/L Final  . AST 04/24/2018 11  0 - 37 U/L Final  . ALT 04/24/2018 8  0 - 35 U/L Final  . Total Protein 04/24/2018 7.8  6.0 - 8.3 g/dL Final  . Albumin 09/98/3382 3.5  3.5 - 5.2 g/dL Final  . Calcium 50/53/9767 9.6  8.4 - 10.5 mg/dL Final  . GFR 34/19/3790 93.39  >60.00 mL/min Final  . Hgb A1c MFr Bld 04/24/2018 6.9* 4.6 - 6.5 % Final   Glycemic Control Guidelines for People with Diabetes:Non Diabetic:  <6%Goal of Therapy: <7%Additional Action Suggested:  >8%  Allergies as of 04/28/2018      Reactions   Ace Inhibitors Other (See Comments)   angioedema   Keflex [cephalexin] Swelling, Other (See Comments)   lips   Rifadin [rifampin] Swelling, Other (See Comments)   lips      Medication List        Accurate as of 04/28/18  9:23 AM. Always use your most recent med list.          amLODipine 5 MG tablet Commonly known as:  NORVASC Take 5 mg by mouth daily.   aspirin EC 81 MG tablet Take 1 tablet (81 mg total) by mouth daily.   hydrochlorothiazide 25 MG tablet Commonly known as:  HYDRODIURIL Take 25 mg by mouth daily.   metFORMIN 750 MG 24 hr tablet Commonly known as:  GLUCOPHAGE-XR TAKE 2 TABLETS BY MOUTH ONCE DAILY WITH BREAKFAST   metoprolol succinate 50 MG 24 hr tablet Commonly known as:  TOPROL-XL Take 50 mg by mouth daily.   rivaroxaban 20 MG Tabs tablet Commonly known as:  XARELTO Take 1 tablet (20 mg total) daily with supper by mouth.     simvastatin 20 MG tablet Commonly known as:  ZOCOR Take 20 mg by mouth every evening.   SYSTANE OP Place 1 drop into both eyes 2 (two) times daily.   Vitamin D3 5000 units Caps Take 5,000 Units by mouth daily.       Allergies:  Allergies  Allergen Reactions  . Ace Inhibitors Other (See Comments)    angioedema  . Keflex [Cephalexin] Swelling and Other (See Comments)    lips  . Rifadin [Rifampin] Swelling and Other (See Comments)    lips    Past Medical History:  Diagnosis Date  . Anemia   . Cardiac mass    a. 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 Not seen on most recent TEE  . Cardiomyopathy in other disease   . Cataracts, bilateral   . Diabetes mellitus    Type 2  . Generalized osteoarthritis   . GERD (gastroesophageal reflux disease)   . HLD (hyperlipidemia)   . Hypertension   . Hypothyroidism   . PAF (paroxysmal atrial fibrillation) ([facility])    a. s/p DCCV 06/2017, on Xarelto  . Phlebitis   . S/P TAVR (transcatheter aortic valve replacement) 04/15/2018   Edwards Sapien 3 THV (size 23 mm, model # 9600TFX, serial # [id number]) via the TF approach  . Severe aortic stenosis    a. s/p TAVR  . Stroke [facility] 2008    Past Surgical History:  Procedure Laterality Date  . ABDOMINAL HYSTERECTOMY    . COLONOSCOPY    . EYE SURGERY Bilateral    cataract removal  . RIGHT/LEFT HEART CATH AND CORONARY ANGIOGRAPHY N/A 03/06/2018   Procedure: RIGHT/LEFT HEART CATH AND CORONARY ANGIOGRAPHY;  Surgeon: Lyn Records, MD;  Location: [facility] INVASIVE CV LAB;  Service: Cardiovascular;  Laterality: N/A;  . TEE WITHOUT CARDIOVERSION N/A 04/01/2018   Procedure: TRANSESOPHAGEAL ECHOCARDIOGRAM (TEE);  Surgeon: Jake Bathe, MD;  Location: [facility] ENDOSCOPY;  Service: Cardiovascular;  Laterality: N/A;  . TEE WITHOUT CARDIOVERSION N/A 04/15/2018   Procedure: TRANSESOPHAGEAL ECHOCARDIOGRAM (TEE);  Surgeon: Tonny Bollman, MD;  Location: [facility] OR;  Service: Open Heart Surgery;  Laterality: N/A;  . TOTAL  KNEE ARTHROPLASTY Right 04/14/2013   Dr August Saucer  . TOTAL KNEE ARTHROPLASTY Right 04/14/2013   Procedure: TOTAL KNEE ARTHROPLASTY;  Surgeon: Cammy Copa, MD;  Location: [facility] OR;  Service: Orthopedics;  Laterality: Right;  .  TRANSCATHETER AORTIC VALVE REPLACEMENT, TRANSFEMORAL  04/15/2018  . TRANSCATHETER AORTIC VALVE REPLACEMENT, TRANSFEMORAL Bilateral 04/15/2018   Procedure: TRANSCATHETER AORTIC VALVE REPLACEMENT, TRANSFEMORAL;  Surgeon: Tonny Bollman, MD;  Location: [facility] OR;  Service: Open Heart Surgery;  Laterality: Bilateral;    Family History  Problem Relation Age of Onset  . Stroke Mother   . Hypertension Mother   . Hypertension Father   . Heart attack Neg Hx     Social History:  reports that she has never smoked. She has never used smokeless tobacco. She reports that she does not drink alcohol or use drugs.  Review of Systems:  HYPERTENSION:  she has been managed with 5 mg amlodipine, 50 mg metoprolol and hydrochlorothiazide, followed by PCP. Has good control She had swelling of the lips with ramipril and has not been prescribed an ARB   HYPERLIPIDEMIA: The lipid abnormality consists of elevated LDL treated with simvastatin 20 mg by PCP and LDL is controlled as follows   Lab Results  Component Value Date   CHOL 121 04/23/2017   HDL 38.10 (L) 04/23/2017   LDLCALC 66 04/23/2017   TRIG 84.0 04/23/2017   CHOLHDL 3 04/23/2017    Multinodular goiter:  She has had a long-standing multinodular goiter, last ultrasound was done in 05/2012 which showed the largest nodule to be 4.1 cm nodule in the isthmus  She does not complain of any difficulty swallowing, local pressure symptoms  TSH has been normal  Lab Results  Component Value Date   TSH 1.09 04/24/2018    No history of numbness or tingling in her feet Foot exam done in 1/19 by her PCP  Last eye exam was in 07/2016  HYPERCALCEMIA: This has been more consistently normal now despite taking HCTZ Not on calcium  supplements No history of osteoporosis   Lab Results  Component Value Date   CALCIUM 9.6 04/24/2018   CALCIUM 9.4 04/16/2018   CALCIUM 9.7 04/11/2018   CALCIUM 9.7 03/04/2018   CALCIUM 9.9 02/17/2018    Lab Results  Component Value Date   PTH 23 08/22/2017   CALCIUM 9.6 04/24/2018   CAION 1.14 (L) 04/15/2018     Examination: BP 138/70 (BP Location: Left Arm, Patient Position: Sitting, Cuff Size: Normal)   Pulse 78   Ht 5\' 3"  (1.6 m)   Wt 190 lb 3.2 oz (86.3 kg)   SpO2 97%   BMI 33.69 kg/m   Body mass index is 33.69 kg/m.   She has about a 3 cm nodule on the left of the isthmus which is smooth and slightly firm The right side has a nodular enlargement about 2-1/2 times normal and the left side is relatively smaller   ASSESSMENT/ PLAN:   Diabetes type 2 with mild obesity See history of present illness for discussion of current diabetes management, blood sugar patterns and problems identified  Her A1c is again 6.9 with metformin alone  She is able to maintain her weight although she is not able to exercise as much ideal Usually diet is fairly good but can do better with high carbohydrate foods Although she did not bring her monitor she thinks her blood sugars are near normal Discussed blood sugar targets both fasting and after meals  HYPERTENSION: Well controlled   GOITER with normal TSH consistently and no local pressure symptoms     Reather Littler 04/28/2018, 9:23 AM   Note: This office note was prepared with Dragon voice recognition system technology. Any transcriptional errors that result from this  process are unintentional.

## 2018-05-13 ENCOUNTER — Encounter: Payer: Self-pay | Admitting: Thoracic Surgery (Cardiothoracic Vascular Surgery)

## 2018-05-20 NOTE — Progress Notes (Signed)
[facility] [place]                                       Cardiology Office Note    Date:  05/20/2018   ID:  Janet Williamson, DOB Apr 11, 1941, MRN [id number]  PCP:  Janet Pillar, MD  Cardiologist: Dr. Marlou Porch / Dr. Burt Knack &Dr. Roxy Manns (TAVR)  CC: 1 month s/p TAVR  History of Present Illness:  Janet Williamson is a 77 y.o. female with a history of embolic CVA, MAC, LBBB, HTN, HLD, DMT2, PAF on Xarelto and severe AS s/p TAVR (04/15/18) who presents to clinic for follow up.    She was seen by Dr. Cyndia Bent in 2008 when she presented with an embolic stroke and was found to have a mass on her mitral valve.It was unclear if this was a fibroblastoma or papilloma or possibly dystrophic calcification and we decided to follow it. She has been followed by Dr. Marlou Porch since that and has done well but over the past year has developed progressive shortness of breath and fatigue with activity.She had an echo on 02/26/2018 showing progression to severe aortic stenosis with a mean gradient of 47 mmHg and a peak gradient of 71 mmHg. The dimensionless index was 0.34. Left ventricular ejection fraction was 55 to 60%.She underwent cardiac catheterization on 03/06/2018 which showed no significant coronary disease. The peak to peak gradient across the aortic valve was 32 mmHg. She was evaluated by Dr. Burt Knack and a repeat TEE was performed to evaluate the mitral valve given her history of a mitral valve mass. This showed no evidence of mitral valve mass or vegetation. There is mild regurgitation. The leaflets were mildly thickened with mild mitral annular calcification. The aortic valve was severely calcified and restricted with a mean gradient of 36 mmHg.   She underwent successful TAVR with a63m Edwards Sapien 3 THV via the TF approach on 04/15/18. Post operative echo showed higher than expected transvalvular aortic gradients likely related to patient-prosthesis  mismatch with a mean gradient of 22 mmHg with no paravalvular regurgitation. She was discharged the following day on Asprin 81 mg daily and home Xarelto .   Today she presents to clinic for follow up. No CP or SOB. No LE edema, orthopnea or PND. No dizziness or syncope. No blood in stool or urine. No palpitations. She has been walking in the mornings 15-20 minutes with no issues. She is staying active doing chores around the house. She now feels like nothing is holding her back.    Past Medical History:  Diagnosis Date  . Anemia   . Cardiac mass    a. 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 Not seen on most recent TEE  . Cardiomyopathy in other disease   . Cataracts, bilateral   . Diabetes mellitus    Type 2  . Generalized osteoarthritis   . GERD (gastroesophageal reflux disease)   . HLD (hyperlipidemia)   . Hypertension   . Hypothyroidism   . PAF (paroxysmal atrial fibrillation) ([facility]    a. s/p DCCV 06/2017, on Xarelto  . Phlebitis   . S/P TAVR (transcatheter aortic valve replacement) 04/15/2018   Edwards Sapien 3 THV (size 23 mm, model # 9600TFX, serial # [id number] via the TF approach  . Severe aortic stenosis    a. s/p TAVR  . Stroke ([facility] 2008    Past  Surgical History:  Procedure Laterality Date  . ABDOMINAL HYSTERECTOMY    . COLONOSCOPY    . EYE SURGERY Bilateral    cataract removal  . RIGHT/LEFT HEART CATH AND CORONARY ANGIOGRAPHY N/A 03/06/2018   Procedure: RIGHT/LEFT HEART CATH AND CORONARY ANGIOGRAPHY;  Surgeon: Belva Crome, MD;  Location: [place] [facility];  Service: Cardiovascular;  Laterality: N/A;  . TEE WITHOUT CARDIOVERSION N/A 04/01/2018   Procedure: TRANSESOPHAGEAL ECHOCARDIOGRAM (TEE);  Surgeon: Jerline Pain, MD;  Location: [facility] END[facility]COPY;  Service: Cardiovascular;  Laterality: N/A;  . TEE WITHOUT CARDIOVERSION N/A 04/15/2018   Procedure: TRANSESOPHAGEAL ECHOCARDIOGRAM (TEE);  Surgeon: Sherren Mocha, MD;  Location: [place];  Service: Open Heart Surgery;   Laterality: N/A;  . TOTAL KNEE ARTHROPLASTY Right 04/14/2013   Dr Marlou Sa  . TOTAL KNEE ARTHROPLASTY Right 04/14/2013   Procedure: TOTAL KNEE ARTHROPLASTY;  Surgeon: Meredith Pel, MD;  Location: [place];  Service: Orthopedics;  Laterality: Right;  . TRANSCATHETER AORTIC VALVE REPLACEMENT, TRANSFEMORAL  04/15/2018  . TRANSCATHETER AORTIC VALVE REPLACEMENT, TRANSFEMORAL Bilateral 04/15/2018   Procedure: TRANSCATHETER AORTIC VALVE REPLACEMENT, TRANSFEMORAL;  Surgeon: Sherren Mocha, MD;  Location: [place];  Service: Open Heart Surgery;  Laterality: Bilateral;    Current Medications: Outpatient Medications Prior to Visit  Medication Sig Dispense Refill  . amLODipine (NORVASC) 5 MG tablet Take 5 mg by mouth daily.    Marland Kitchen aspirin EC 81 MG tablet Take 1 tablet (81 mg total) by mouth daily.    . Cholecalciferol (VITAMIN D3) 5000 units CAPS Take 5,000 Units by mouth daily.    . hydrochlorothiazide (HYDRODIURIL) 25 MG tablet Take 25 mg by mouth daily.    . metFORMIN (GLUCOPHAGE-XR) 750 MG 24 hr tablet TAKE 2 TABLETS BY MOUTH ONCE DAILY WITH BREAKFAST 60 tablet 3  . metoprolol succinate (TOPROL-XL) 50 MG 24 hr tablet Take 50 mg by mouth daily.     Vladimir Faster Glycol-Propyl Glycol (SYSTANE OP) Place 1 drop into both eyes 2 (two) times daily.     . rivaroxaban (XARELTO) 20 MG TABS tablet Take 1 tablet (20 mg total) daily with supper by mouth. 30 tablet 11  . simvastatin (ZOCOR) 20 MG tablet Take 20 mg by mouth every evening.     No facility-administered medications prior to visit.      Allergies:   Ace inhibitors; Keflex [cephalexin]; and Rifadin [rifampin]   Social History   Socioeconomic History  . Marital status: Widowed    Spouse name: Not on file  . Number of children: Not on file  . Years of education: Not on file  . Highest education level: Not on file  Occupational History  . Not on file  Social Needs  . Financial resource strain: Not on file  . Food insecurity:    Worry: Not on file     Inability: Not on file  . Transportation needs:    Medical: Not on file    Non-medical: Not on file  Tobacco Use  . Smoking status: Never Smoker  . Smokeless tobacco: Never Used  Substance and Sexual Activity  . Alcohol use: No  . Drug use: No  . Sexual activity: Not Currently  Lifestyle  . Physical activity:    Days per week: Not on file    Minutes per session: Not on file  . Stress: Not on file  Relationships  . Social connections:    Talks on phone: Not on file    Gets together: Not on file  Attends religious service: Not on file    Active member of club or organization: Not on file    Attends meetings of clubs or organizations: Not on file    Relationship status: Not on file  Other Topics Concern  . Not on file  Social History Narrative  . Not on file     Family History:  The patient's family history includes Hypertension in her father and mother; Stroke in her mother.      ROS:   Please see the history of present illness.    ROS All other systems reviewed and are negative.   PHYSICAL EXAM:   VS:  Ht _0  (1.6 m)   BMI 33.69 kg/m    GEN: Well nourished, well developed, in no acute distress  HEENT: normal  Neck: no JVD, carotid bruits, or masses Cardiac: RRR; 3/6 SEM @ RSUB. No rubs, or gallops,no edema  Respiratory:  clear to auscultation bilaterally, normal work of breathing GI: soft, nontender, nondistended, + BS MS: no deformity or atrophy  Skin: warm and dry, no rash Neuro:  Alert and Oriented x 3, Strength and sensation are intact Psych: euthymic mood, full affect   Wt Readings from Last 3 Encounters:  04/28/18 190 lb 3.2 oz (86.3 kg)  04/23/18 189 lb 12.8 oz (86.1 kg)  04/16/18 191 lb 5.8 oz (86.8 kg)      Studies/Labs Reviewed:   EKG:  EKG is NOT ordered today.    Recent Labs: 04/11/2018: B Natriuretic Peptide 74.6 04/16/2018: Hemoglobin 8.9; Magnesium 1.7; Platelets 397 04/24/2018: ALT 8; BUN 12; Creatinine, Ser 0.77; Potassium 3.5;  Sodium 137; TSH 1.09   Lipid Panel    Component Value Date/Time   CHOL 121 04/23/2017 1022   TRIG 84.0 04/23/2017 1022   HDL 38.10 (L) 04/23/2017 1022   CHOLHDL 3 04/23/2017 1022   VLDL 16.8 04/23/2017 1022   LDLCALC 66 04/23/2017 1022    Additional studies/ records that were reviewed today include:  TAVR OPERATIVE NOTE   Date of Procedure:04/15/2018  Preoperative Diagnosis:Severe Aortic Stenosis   Procedure:   Transcatheter Aortic Valve Replacement - Percutaneous Transfemoral Approach Edwards Sapien 3 THV (size 72m, model # 9600TFX, serial # [id number]  Co-Surgeons:Clarence H. ORoxy Manns MD and MSherren Mocha MD  Pre-operative Echo Findings: ? Severe aortic stenosis ? Normalleft ventricular systolic function  Post-operative Echo Findings: ? Noparavalvular leak ? Normal/unchangedleft ventricular systolic function  ___________________   2D ECHO 04/16/18 Study Conclusions - Left ventricle: The cavity size was normal. Wall thickness was increased in a pattern of moderate LVH. Systolic function was normal. The estimated ejection fraction was in the range of 60% to 65%. Wall motion was normal; there were no regional wall motion abnormalities. Features are consistent with a pseudonormal left ventricular filling pattern, with concomitant abnormal relaxation and increased filling pressure (grade 2 diastolic dysfunction). - Aortic valve: A prosthesis was present and functioning normally. The prosthesis had a normal range of motion. The sewing ring appeared normal, had no rocking motion, and showed no evidence of dehiscence. There was trivial regurgitation. Mean gradient (S): 23 mm Hg. Peak gradient (S): 35 mm Hg. Valve area (VTI): 2.15 cm^2. Valve area (Vmax): 2.08 cm^2. Valve area (Vmean): 1.88 cm^2. - Mitral valve: Calcified annulus. Mildly thickened leaflets . - Left  atrium: The atrium was moderately dilated. - Pulmonary arteries: PA peak pressure: 40 mm Hg (S). Impressions: - Post TAVR with well seated bioprosthesis.  ______________  2D ECHO 05/21/18 ( 1 month s/p TAVR)  Study Conclusions - Left ventricle: The cavity size was normal. There was severe   focal basal hypertrophy of the septum. Systolic function was   normal. The estimated ejection fraction was in the range of 55%   to 60%. Wall motion was normal; there were no regional wall   motion abnormalities. Doppler parameters are consistent with   abnormal left ventricular relaxation (grade 1 diastolic   dysfunction). - Aortic valve: TAVR with a 23 mm Edwards Sapien 3 THV via the TF   approach on 04/15/18. Peak velocity (S): 324 cm/s. Mean gradient   (S): 24 mm Hg. Valve area (Vmax): 1.11 cm^2. - Mitral valve: Moderately calcified annulus. - Left atrium: The atrium was mildly dilated. - Tricuspid valve: There was moderate regurgitation. - Pulmonic valve: There was moderate regurgitation. - Pulmonary arteries: Systolic pressure was mildly increased. PA   peak pressure: 43 mm Hg (S).   ASSESSMENT & PLAN:   Severe AS s/p TAVR: 2D ECHO today shows EF 55% with normally functioning TAVR with no PVL and mean gradient 67m Hg. Her gradients are felt to be elevated from patient prosthetic mismatch. She has had dramatic symptomatic improvement with NYHA class I symptoms. SBE prophylaxis discussed; she does not go to dentist as she has full dentures. ASA can be discontinued after 6 months of therapy (jan 2020) and she will continue on Xarelto.   HTN:  Bp well controlled today  HLD: continue statin   PAF: sounds regular on exam. Continue Xarelto.    Medication Adjustments/Labs and Tests Ordered: Current medicines are reviewed at length with the patient today.  Concerns regarding medicines are outlined above.  Medication changes, Labs and Tests ordered today are listed in the Patient  Instructions below. There are no Patient Instructions on file for this visit.   Signed, KAngelena Form PA-C  05/20/2018 2:15 PM    [facility] [address] [place]  [id number]: [phone] Fax: [phone]

## 2018-05-21 ENCOUNTER — Ambulatory Visit (HOSPITAL_COMMUNITY): Payer: Medicare Other | Attending: Cardiology

## 2018-05-21 ENCOUNTER — Other Ambulatory Visit: Payer: Self-pay

## 2018-05-21 ENCOUNTER — Encounter: Payer: Self-pay | Admitting: Physician Assistant

## 2018-05-21 ENCOUNTER — Ambulatory Visit (INDEPENDENT_AMBULATORY_CARE_PROVIDER_SITE_OTHER): Payer: Medicare Other | Admitting: Physician Assistant

## 2018-05-21 ENCOUNTER — Ambulatory Visit: Payer: Medicare Other | Admitting: Cardiology

## 2018-05-21 VITALS — BP 126/60 | HR 78 | Ht 63.0 in | Wt 190.1 lb

## 2018-05-21 DIAGNOSIS — E785 Hyperlipidemia, unspecified: Secondary | ICD-10-CM | POA: Diagnosis not present

## 2018-05-21 DIAGNOSIS — I10 Essential (primary) hypertension: Secondary | ICD-10-CM | POA: Diagnosis not present

## 2018-05-21 DIAGNOSIS — Z952 Presence of prosthetic heart valve: Secondary | ICD-10-CM | POA: Insufficient documentation

## 2018-05-21 DIAGNOSIS — E119 Type 2 diabetes mellitus without complications: Secondary | ICD-10-CM | POA: Diagnosis not present

## 2018-05-21 DIAGNOSIS — I480 Paroxysmal atrial fibrillation: Secondary | ICD-10-CM

## 2018-05-21 DIAGNOSIS — I361 Nonrheumatic tricuspid (valve) insufficiency: Secondary | ICD-10-CM | POA: Insufficient documentation

## 2018-05-21 NOTE — Patient Instructions (Addendum)
Medication Instructions:  Your physician recommends that you continue on your current medications as directed. Please refer to the Current Medication list given to you today. Stop aspirin on January 9,2020  Labwork: none  Testing/Procedures: Your physician has requested that you have an echocardiogram. Echocardiography is a painless test that uses sound waves to create images of your heart. It provides your doctor with information about the size and shape of your heart and how well your heart's chambers and valves are working. This procedure takes approximately one hour. There are no restrictions for this procedure.  To be done in 11 months on day of appointment with K. Janee Morn, [place]. We will call you to schedule this appointment Follow-Up: Your physician recommends that you schedule a follow-up appointment in: November with Dr.Skains--scheduled for November 18,2019 at 10:40  Your physician recommends that you schedule a follow-up appointment in: 11 months with K. Janee Morn, [place].  We will call you to schedule this appointment    Any Other Special Instructions Will Be Listed Below (If Applicable).     If you need a refill on your cardiac medications before your next appointment, please call your pharmacy.

## 2018-05-23 ENCOUNTER — Encounter: Payer: Self-pay | Admitting: Thoracic Surgery (Cardiothoracic Vascular Surgery)

## 2018-06-10 ENCOUNTER — Other Ambulatory Visit: Payer: Self-pay | Admitting: Endocrinology

## 2018-06-10 MED ORDER — METFORMIN HCL ER 750 MG PO TB24: ORAL_TABLET | ORAL | 3 refills | Status: DC

## 2018-06-10 NOTE — Telephone Encounter (Signed)
Refilled Rx metformin disp-60, R-3 sent --[facility]

## 2018-06-30 ENCOUNTER — Telehealth: Payer: Self-pay | Admitting: Endocrinology

## 2018-06-30 NOTE — Telephone Encounter (Signed)
Patient needs Accucheck Test Strips sent to [facility] on [address]

## 2018-07-02 MED ORDER — GLUCOSE BLOOD VI STRP: ORAL_STRIP | 3 refills | Status: DC

## 2018-07-02 NOTE — Telephone Encounter (Signed)
Had to call pt because the type of meter was not listed, Accu Chek Aviva. Sent to pharmacy

## 2018-07-10 IMAGING — CR DG CHEST 2V
2 series · 2 of 2 positions shown · non-contrast
Comparison: CT 04/09/2018.

CLINICAL DATA: Aortic valve replacement.

EXAM:
CHEST - 2 VIEW

[w chest pa]
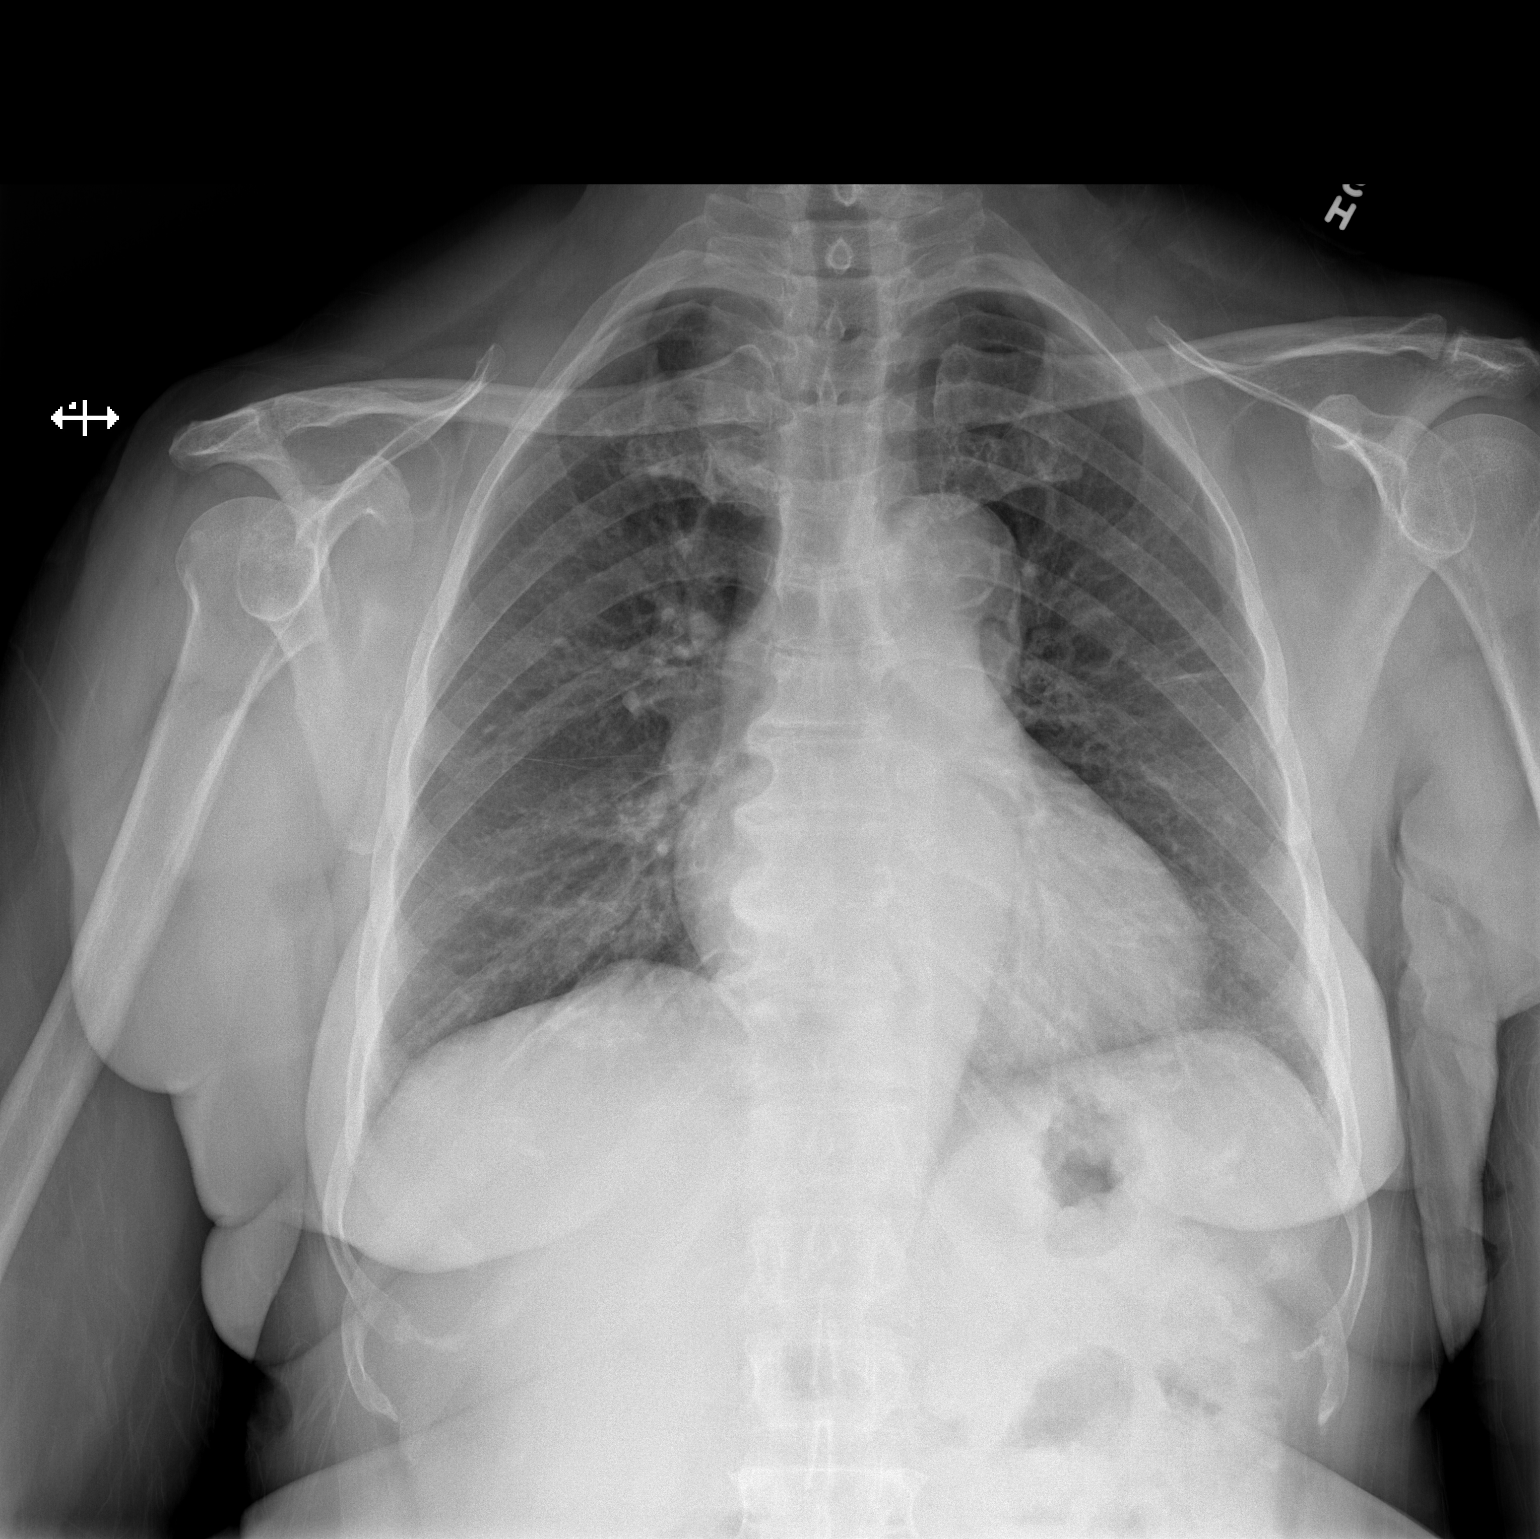

[w chest lat]
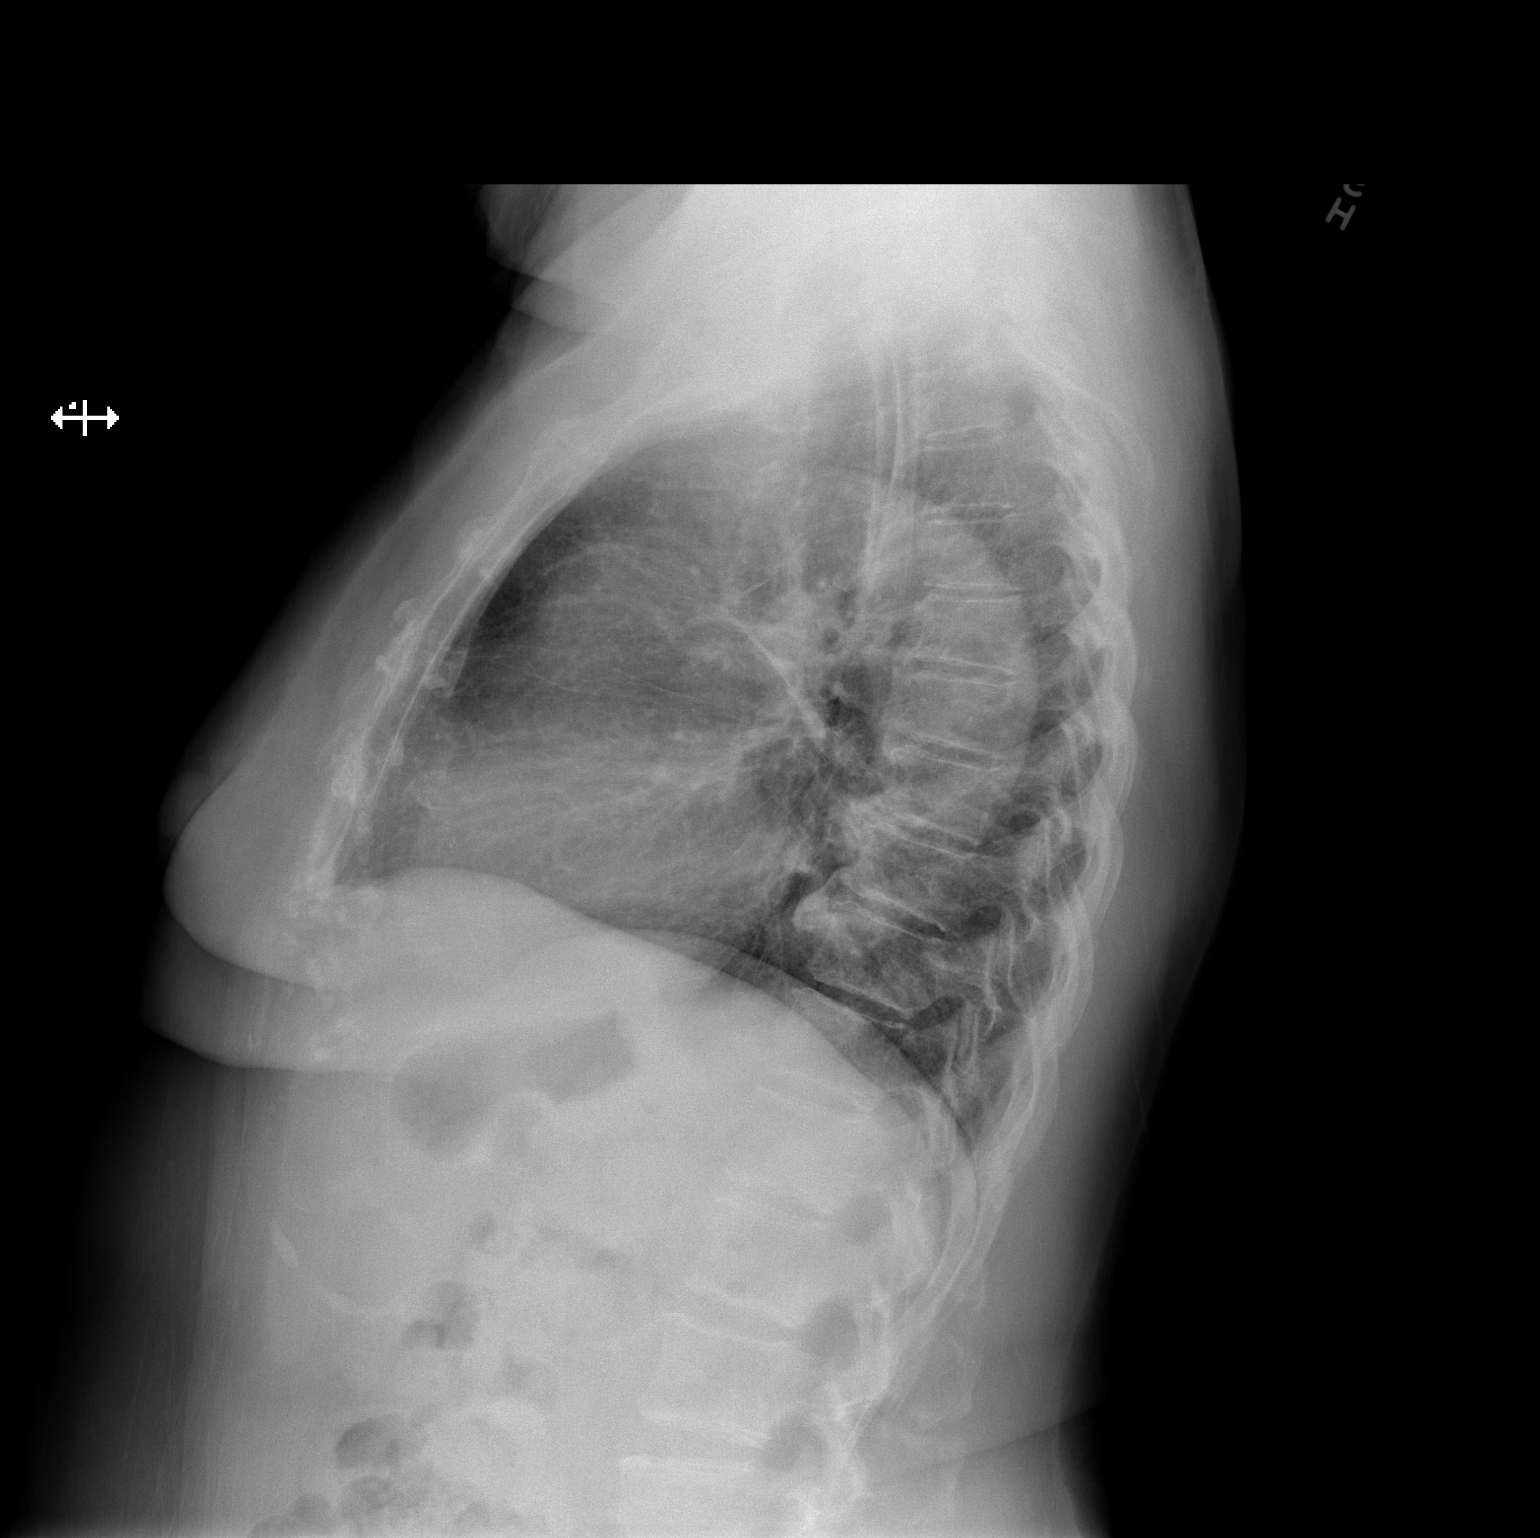

[2 of 2 positions shown; findings below may reference images not displayed]

FINDINGS: Mediastinum and hilar structures normal. Cardiomegaly with normal
pulmonary vascularity. No focal infiltrate. Mild left mid lung field
subsegmental atelectasis. No pleural effusion or pneumothorax.
Degenerative changes thoracic spine.
IMPRESSION: 1. Mild left mid lung field subsegmental atelectasis. No acute
infiltrate.

2.  Cardiomegaly with normal pulmonary vascularity

## 2018-07-14 IMAGING — DX DG CHEST 1V PORT
1 series · 1 of 1 positions shown · non-contrast
Comparison: Chest radiograph 04/11/2018 and earlier.

CLINICAL DATA: 77-year-old female postoperative day zero status
post TAVR for severe aortic stenosis.

EXAM:
PORTABLE CHEST 1 VIEW

[chest]
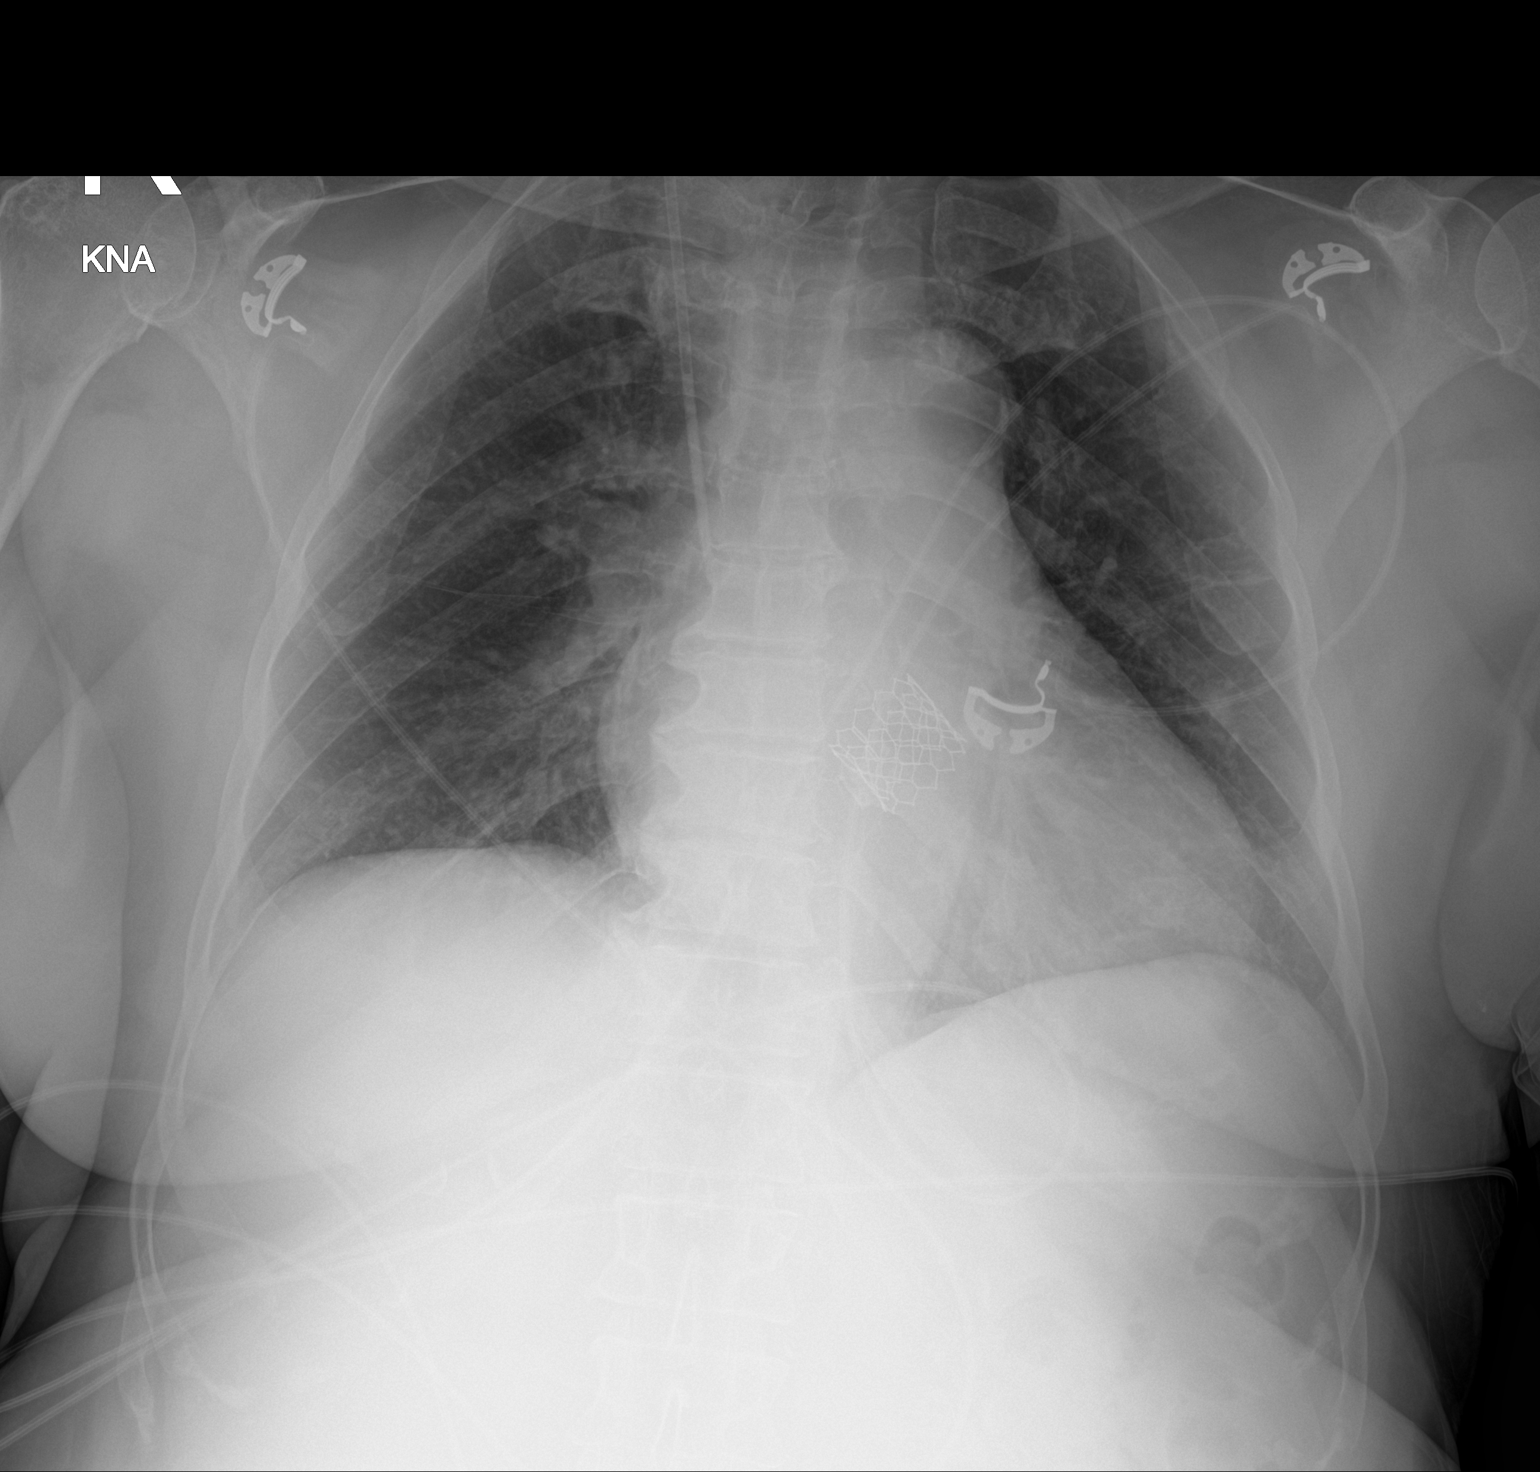

[1 of 1 positions shown; findings below may reference images not displayed]

FINDINGS: Portable AP semi upright view at 3827 hours. Right IJ central line
in place with tip at the lower SVC level. No pneumothorax or
pulmonary edema. Mildly lower lung volumes. No confluent pulmonary
opacity. Prosthetic aortic valve in place. Stable cardiomegaly and
mediastinal contours. Visualized tracheal air column is within
normal limits. Negative visible bowel gas pattern.
IMPRESSION: 1. Status post TAVR. Right IJ central line in place, tip at the
lower SVC level.
2.  No acute cardiopulmonary abnormality.

## 2018-08-25 ENCOUNTER — Encounter: Payer: Self-pay | Admitting: Cardiology

## 2018-08-25 ENCOUNTER — Ambulatory Visit (INDEPENDENT_AMBULATORY_CARE_PROVIDER_SITE_OTHER): Payer: Medicare Other | Admitting: Cardiology

## 2018-08-25 VITALS — BP 130/70 | HR 71 | Ht 63.0 in | Wt 188.8 lb

## 2018-08-25 DIAGNOSIS — I480 Paroxysmal atrial fibrillation: Secondary | ICD-10-CM

## 2018-08-25 DIAGNOSIS — I350 Nonrheumatic aortic (valve) stenosis: Secondary | ICD-10-CM

## 2018-08-25 DIAGNOSIS — Z952 Presence of prosthetic heart valve: Secondary | ICD-10-CM | POA: Diagnosis not present

## 2018-08-25 DIAGNOSIS — I447 Left bundle-branch block, unspecified: Secondary | ICD-10-CM | POA: Diagnosis not present

## 2018-08-25 NOTE — Progress Notes (Signed)
Cardiology Office Note:    Date:  08/25/2018   ID:  Janet Williamson, DOB 08-22-1941, MRN [id number]  PCP:  Kelton Pillar, MD  Cardiologist:  [name], MD  Electrophysiologist:  None   Referring MD: Kelton Pillar, MD     History of Present Illness:    Janet Williamson is a 77 y.o. female here for follow-up of TAVR aortic valve, history of embolic CVA MAC LBBB hypertension hyperlipidemia diabetes paroxysmal atrial fibrillation on Xarelto.  Her TAVR took place on 04/15/2018.  Prior office visit "She was seen by Dr. Cyndia Bent in 2008 when she presented with an embolic stroke and was found to have a mass on her mitral valve.It was unclear if this was a fibroblastoma or papilloma or possibly dystrophic calcification and we decided to follow it. She has been followed by Dr. Marlou Porch since that and has done well but over the past year has developed progressive shortness of breath and fatigue with activity.She had an echo on 02/26/2018 showing progression to severe aortic stenosis with a mean gradient of 47 mmHg and a peak gradient of 71 mmHg. The dimensionless index was 0.34. Left ventricular ejection fraction was 55 to 60%.She underwent cardiac catheterization on 03/06/2018 which showed no significant coronary disease. The peak to peak gradient across the aortic valve was 32 mmHg. She was evaluated by Dr. Burt Knack and a repeat TEE was performed to evaluate the mitral valve given her history of a mitral valve mass. This showed no evidence of mitral valve mass or vegetation. There is mild regurgitation. The leaflets were mildly thickened with mild mitral annular calcification. The aortic valve was severely calcified and restricted with a mean gradient of 36 mmHg.   She underwentsuccessful TAVR with a28m Edwards Sapien 3 THV via the TF approach on 04/15/18. Post operative echo showed higher than expected transvalvular aortic gradients likely related to patient-prosthesis mismatch with a mean  gradient of 22 mmHgwithno paravalvular regurgitation. She was discharged the following day on Asprin 81 mg daily and home Xarelto ."    Past Medical History:  Diagnosis Date  . Anemia   . Cardiac mass    a. 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 Not seen on most recent TEE  . Cardiomyopathy in other disease   . Cataracts, bilateral   . Diabetes mellitus    Type 2  . Generalized osteoarthritis   . GERD (gastroesophageal reflux disease)   . HLD (hyperlipidemia)   . Hypertension   . Hypothyroidism   . PA[place]) [place]    a. s/p DCCV 06/2017, on Xarelto  . Phlebitis   . S/P TAVR (transcatheter aortic valve replacement) 04/15/2018   Edwards Sapien 3 THV (size 23 mm, model # 9600TFX, serial # [id number] via the TF approach  . Severe aortic stenosis    a. s/p TAVR  . Stroke ([facility] 2008    Past Surgical History:  Procedure Laterality Date  . ABDOMINAL HYSTERECTOMY    . COLONOSCOPY    . EYE SURGERY Bilateral    cataract removal  . RIGHT/LEFT HEART CATH AND CORONARY ANGIOGRAPHY N/A 03/06/2018   Procedure: RIGHT/LEFT HEART CATH AND CORONARY ANGIOGRAPHY;  Surgeon: SBelva Crome MD;  [facility];  Service: Cardiovascular;  Laterality: N/A;  . TEE WITHOUT CARDIOVERSION N/A 04/01/2018   Procedure: TRANSESOPHAGEAL ECHOCARDIOGRAM (TEE);  Surgeon: SJerline Pain MD;  [facility];  Service: Cardiovascular;  Laterality: N/A;  . TEE WITHOUT CARDIOVERSION N/A 04/15/2018   Procedure: TRANSESOPHAGEAL ECHOCARDIOGRAM (  TEE);  Surgeon: Sherren Mocha, MD;  Location: [place];  Service: Open Heart Surgery;  Laterality: N/A;  . TOTAL KNEE ARTHROPLASTY Right 04/14/2013   Dr Marlou Sa  . TOTAL KNEE ARTHROPLASTY Right 04/14/2013   Procedure: TOTAL KNEE ARTHROPLASTY;  Surgeon: Meredith Pel, MD;  Location: [place];  Service: Orthopedics;  Laterality: Right;  . TRANSCATHETER AORTIC VALVE REPLACEMENT, TRANSFEMORAL  04/15/2018  . TRANSCATHETER AORTIC VALVE  REPLACEMENT, TRANSFEMORAL Bilateral 04/15/2018   Procedure: TRANSCATHETER AORTIC VALVE REPLACEMENT, TRANSFEMORAL;  Surgeon: Sherren Mocha, MD;  Location: [place];  Service: Open Heart Surgery;  Laterality: Bilateral;    Current Medications: Current Meds  Medication Sig  . amLODipine (NORVASC) 5 MG tablet Take 5 mg by mouth daily.  Marland Kitchen aspirin EC 81 MG tablet Take 1 tablet (81 mg total) by mouth daily.  . Cholecalciferol (VITAMIN D3) 5000 units CAPS Take 5,000 Units by mouth daily.  Marland Kitchen glucose blood (ACCU-CHEK AVIVA) test strip Use as instructed to check once daily DX: E11.9  . hydrochlorothiazide (HYDRODIURIL) 25 MG tablet Take 25 mg by mouth daily.  . metFORMIN (GLUCOPHAGE-XR) 750 MG 24 hr tablet TAKE 2 TABLETS BY MOUTH ONCE DAILY WITH BREAKFAST  . metoprolol succinate (TOPROL-XL) 50 MG 24 hr tablet Take 50 mg by mouth daily.   Vladimir Faster Glycol-Propyl Glycol (SYSTANE OP) Place 1 drop into both eyes 2 (two) times daily.   . rivaroxaban (XARELTO) 20 MG TABS tablet Take 1 tablet (20 mg total) daily with supper by mouth.  . simvastatin (ZOCOR) 20 MG tablet Take 20 mg by mouth every evening.     Allergies:   Ace inhibitors; Keflex [cephalexin]; and Rifadin [rifampin]   Social History   Socioeconomic History  . Marital status: Widowed    Spouse name: Not on file  . Number of children: Not on file  . Years of education: Not on file  . Highest education level: Not on file  Occupational History  . Not on file  Social Needs  . Financial resource strain: Not on file  . Food insecurity:    Worry: Not on file    Inability: Not on file  . Transportation needs:    Medical: Not on file    Non-medical: Not on file  Tobacco Use  . Smoking status: Never Smoker  . Smokeless tobacco: Never Used  Substance and Sexual Activity  . Alcohol use: No  . Drug use: No  . Sexual activity: Not Currently  Lifestyle  . Physical activity:    Days per week: Not on file    Minutes per session: Not on file    . Stress: Not on file  Relationships  . Social connections:    Talks on phone: Not on file    Gets together: Not on file    Attends religious service: Not on file    Active member of club or organization: Not on file    Attends meetings of clubs or organizations: Not on file    Relationship status: Not on file  Other Topics Concern  . Not on file  Social History Narrative  . Not on file     Family History: The patient's family history includes Hypertension in her father and mother; Stroke in her mother. There is no history of Heart attack.  ROS:   Please see the history of present illness.    Cough, occasional shortness of breath with activity all other systems reviewed and are negative.  EKGs/Labs/Other Studies Reviewed:    The following  studies were reviewed today: Echocardiogram, prior EKG, lab work, office note reviewed  EKG: Normal sinus rhythm left bundle branch block  Recent Labs: 04/11/2018: B Natriuretic Peptide 74.6 04/16/2018: Hemoglobin 8.9; Magnesium 1.7; Platelets 397 04/24/2018: ALT 8; BUN 12; Creatinine, Ser 0.77; Potassium 3.5; Sodium 137; TSH 1.09  Recent Lipid Panel    Component Value Date/Time   CHOL 121 04/23/2017 1022   TRIG 84.0 04/23/2017 1022   HDL 38.10 (L) 04/23/2017 1022   CHOLHDL 3 04/23/2017 1022   VLDL 16.8 04/23/2017 1022   LDLCALC 66 04/23/2017 1022    Physical Exam:    VS:  BP 130/70   Pulse 71   Ht _0  (1.6 m)   Wt 188 lb 12.8 oz (85.6 kg)   SpO2 98%   BMI 33.44 kg/m     Wt Readings from Last 3 Encounters:  08/25/18 188 lb 12.8 oz (85.6 kg)  05/21/18 190 lb 1.9 oz (86.2 kg)  04/28/18 190 lb 3.2 oz (86.3 kg)     GEN:  Well nourished, well developed in no acute distress HEENT: Normal NECK: No JVD; No carotid bruits LYMPHATICS: No lymphadenopathy CARDIAC: RRR, 2/6 SM murmurs, rubs, gallops RESPIRATORY:  Clear to auscultation without rales, wheezing or rhonchi  ABDOMEN: Soft, non-tender, non-distended MUSCULOSKELETAL:   No edema; No deformity  SKIN: Warm and dry NEUROLOGIC:  Alert and oriented x 3 PSYCHIATRIC:  Normal affect   ASSESSMENT:    1. S/P TAVR (transcatheter aortic valve replacement)   2. PAF (paroxysmal atrial fibrillation) ([place])   3. Nonrheumatic aortic valve stenosis    PLAN:    In order of problems listed above:  TAVR valve - Normal function on echocardiogram with mean gradient of 24 mmHg elevated from patient prosthetic mismatch.  Much improved symptoms.  Aspirin can be discontinued in January 2020.  Continue with Xarelto secondary to PAF.  Paroxysmal atrial fibrillation - Doing quite well.  No bleeding with Xarelto.  Hyperlipidemia -Continue statin therapy  Essential hypertension, excellent control.  Medications reviewed.  Transient dizziness/left bundle branch block -No syncope.  However given her PAF, TAVR, we will check a long-term monitor to make sure that she does not have any evidence of conduction disease.  3-monthfollow-up with LCecille Rubin 1 year with me.  Medication Adjustments/Labs and Tests Ordered: Current medicines are reviewed at length with the patient today.  Concerns regarding medicines are outlined above.  Orders Placed This Encounter  Procedures  . LONG TERM MONITOR (3-14 DAYS)   No orders of the defined types were placed in this encounter.   Patient Instructions  Medication Instructions:  The current medical regimen is effective;  continue present plan and medications.  If you need a refill on your cardiac medications before your next appointment, please call your pharmacy.   Testing/Procedures: Your physician has recommended that you wear an event monitor for 14 days. Event monitors are medical devices that record the heart's electrical activity. Doctors most often u[place] monitors to diagnose arrhythmias. Arrhythmias are problems with the speed or rhythm of the heartbeat. The monitor is a small, portable device. You can wear one while you do your  normal daily activities. This is usually used to diagnose what is causing palpitations/syncope (passing out).  Follow-Up: [facility] and your health needs are our priority.  As part of our continuing mission to provide you with exceptional heart care, we have created designated Provider Care Teams.  These Care Teams include your primary Cardiologist (physician) and Advanced  Practice Providers (APPs -  Physician Assistants and Nurse Practitioners) who all work together to provide you with the care you need, when you need it. You will need a follow up appointment in 6 months with Truitt Merle, NP and Dr Marlou Porch in 1 years.  Please call our office 2 months in advance to schedule this appointment.  You may see [name], MD or one of the following Advanced Practice Providers on your designated Care Team:   Truitt Merle, NP Cecilie Kicks, NP . Kathyrn Drown, NP  Thank you for choosing [facility]!!         Signed, [name], MD  08/25/2018 11:38 AM    [place]

## 2018-08-25 NOTE — Patient Instructions (Signed)
Medication Instructions:  The current medical regimen is effective;  continue present plan and medications.  If you need a refill on your cardiac medications before your next appointment, please call your pharmacy.   Testing/Procedures: Your physician has recommended that you wear an event monitor for 14 days. Event monitors are medical devices that record the heart's electrical activity. Doctors most often [place] these monitors to diagnose arrhythmias. Arrhythmias are problems with the speed or rhythm of the heartbeat. The monitor is a small, portable device. You can wear one while you do your normal daily activities. This is usually used to diagnose what is causing palpitations/syncope (passing out).  Follow-Up: At [facility], you and your health needs are our priority.  As part of our continuing mission to provide you with exceptional heart care, we have created designated Provider Care Teams.  These Care Teams include your primary Cardiologist (physician) and Advanced Practice Providers (APPs -  Physician Assistants and Nurse Practitioners) who all work together to provide you with the care you need, when you need it. You will need a follow up appointment in 6 months with Norma Fredrickson, NP and Dr Anne Fu in 1 years.  Please call our office 2 months in advance to schedule this appointment.  You may see Donato Schultz, MD or one of the following Advanced Practice Providers on your designated Care Team:   Norma Fredrickson, NP Nada Boozer, NP . Georgie Chard, NP  Thank you for choosing [facility]!!

## 2018-09-01 ENCOUNTER — Other Ambulatory Visit: Payer: Self-pay | Admitting: Cardiology

## 2018-09-01 DIAGNOSIS — I480 Paroxysmal atrial fibrillation: Secondary | ICD-10-CM

## 2018-09-01 DIAGNOSIS — R42 Dizziness and giddiness: Secondary | ICD-10-CM

## 2018-09-02 ENCOUNTER — Ambulatory Visit (INDEPENDENT_AMBULATORY_CARE_PROVIDER_SITE_OTHER): Payer: Medicare Other

## 2018-09-02 DIAGNOSIS — R42 Dizziness and giddiness: Secondary | ICD-10-CM

## 2018-09-02 DIAGNOSIS — I480 Paroxysmal atrial fibrillation: Secondary | ICD-10-CM

## 2018-09-08 ENCOUNTER — Other Ambulatory Visit: Payer: Self-pay

## 2018-09-08 MED ORDER — METFORMIN HCL ER 750 MG PO TB24: ORAL_TABLET | ORAL | 0 refills | Status: DC

## 2018-09-29 ENCOUNTER — Ambulatory Visit
Admission: RE | Admit: 2018-09-29 | Discharge: 2018-09-29 | Disposition: A | Discharge status: Home / Self Care | Payer: Medicare Other | Source: Ambulatory Visit | Attending: Family Medicine | Admitting: Family Medicine

## 2018-09-29 ENCOUNTER — Encounter (HOSPITAL_COMMUNITY): Payer: Self-pay | Admitting: *Deleted

## 2018-09-29 ENCOUNTER — Inpatient Hospital Stay (HOSPITAL_COMMUNITY)
Admission: EM | Admit: 2018-09-29 | Discharge: 2018-10-04 | DRG: 812 | Disposition: A | Discharge status: Home / Self Care | Payer: Medicare Other | Attending: Internal Medicine | Admitting: Internal Medicine

## 2018-09-29 ENCOUNTER — Other Ambulatory Visit: Payer: Self-pay

## 2018-09-29 ENCOUNTER — Other Ambulatory Visit: Payer: Self-pay | Admitting: Family Medicine

## 2018-09-29 DIAGNOSIS — R0600 Dyspnea, unspecified: Secondary | ICD-10-CM

## 2018-09-29 DIAGNOSIS — E785 Hyperlipidemia, unspecified: Secondary | ICD-10-CM | POA: Diagnosis present

## 2018-09-29 DIAGNOSIS — K319 Disease of stomach and duodenum, unspecified: Secondary | ICD-10-CM | POA: Diagnosis present

## 2018-09-29 DIAGNOSIS — Z8673 Personal history of transient ischemic attack (TIA), and cerebral infarction without residual deficits: Secondary | ICD-10-CM

## 2018-09-29 DIAGNOSIS — E119 Type 2 diabetes mellitus without complications: Secondary | ICD-10-CM

## 2018-09-29 DIAGNOSIS — E039 Hypothyroidism, unspecified: Secondary | ICD-10-CM | POA: Diagnosis present

## 2018-09-29 DIAGNOSIS — I10 Essential (primary) hypertension: Secondary | ICD-10-CM | POA: Diagnosis present

## 2018-09-29 DIAGNOSIS — K922 Gastrointestinal hemorrhage, unspecified: Secondary | ICD-10-CM | POA: Diagnosis present

## 2018-09-29 DIAGNOSIS — Z952 Presence of prosthetic heart valve: Secondary | ICD-10-CM

## 2018-09-29 DIAGNOSIS — D509 Iron deficiency anemia, unspecified: Secondary | ICD-10-CM

## 2018-09-29 DIAGNOSIS — Z881 Allergy status to other antibiotic agents status: Secondary | ICD-10-CM

## 2018-09-29 DIAGNOSIS — I471 Supraventricular tachycardia: Secondary | ICD-10-CM | POA: Diagnosis present

## 2018-09-29 DIAGNOSIS — K259 Gastric ulcer, unspecified as acute or chronic, without hemorrhage or perforation: Secondary | ICD-10-CM | POA: Diagnosis present

## 2018-09-29 DIAGNOSIS — R0609 Other forms of dyspnea: Principal | ICD-10-CM

## 2018-09-29 DIAGNOSIS — D649 Anemia, unspecified: Secondary | ICD-10-CM | POA: Diagnosis present

## 2018-09-29 DIAGNOSIS — Z888 Allergy status to other drugs, medicaments and biological substances status: Secondary | ICD-10-CM

## 2018-09-29 DIAGNOSIS — Z953 Presence of xenogenic heart valve: Secondary | ICD-10-CM

## 2018-09-29 DIAGNOSIS — I480 Paroxysmal atrial fibrillation: Secondary | ICD-10-CM | POA: Diagnosis present

## 2018-09-29 DIAGNOSIS — Z7984 Long term (current) use of oral hypoglycemic drugs: Secondary | ICD-10-CM

## 2018-09-29 DIAGNOSIS — I429 Cardiomyopathy, unspecified: Secondary | ICD-10-CM | POA: Diagnosis present

## 2018-09-29 DIAGNOSIS — Z96651 Presence of right artificial knee joint: Secondary | ICD-10-CM | POA: Diagnosis present

## 2018-09-29 DIAGNOSIS — Z8672 Personal history of thrombophlebitis: Secondary | ICD-10-CM

## 2018-09-29 DIAGNOSIS — Z7901 Long term (current) use of anticoagulants: Secondary | ICD-10-CM

## 2018-09-29 DIAGNOSIS — Z7982 Long term (current) use of aspirin: Secondary | ICD-10-CM

## 2018-09-29 DIAGNOSIS — Z79899 Other long term (current) drug therapy: Secondary | ICD-10-CM

## 2018-09-29 DIAGNOSIS — E1149 Type 2 diabetes mellitus with other diabetic neurological complication: Secondary | ICD-10-CM | POA: Diagnosis present

## 2018-09-29 DIAGNOSIS — E876 Hypokalemia: Secondary | ICD-10-CM | POA: Diagnosis not present

## 2018-09-29 HISTORY — DX: Left bundle-branch block, unspecified: I44.7

## 2018-09-29 LAB — CBC
HCT: 21 % — ABNORMAL LOW (ref 36.0–46.0)
HEMOGLOBIN: 5.2 g/dL — AB (ref 12.0–15.0)
MCH: 15.9 pg — ABNORMAL LOW (ref 26.0–34.0)
MCHC: 24.8 g/dL — ABNORMAL LOW (ref 30.0–36.0)
MCV: 64 fL — ABNORMAL LOW (ref 80.0–100.0)
Platelets: 530 10*3/uL — ABNORMAL HIGH (ref 150–400)
RBC: 3.28 MIL/uL — ABNORMAL LOW (ref 3.87–5.11)
RDW: 25.2 % — ABNORMAL HIGH (ref 11.5–15.5)
WBC: 12.5 10*3/uL — ABNORMAL HIGH (ref 4.0–10.5)
nRBC: 0.3 % — ABNORMAL HIGH (ref 0.0–0.2)

## 2018-09-29 LAB — COMPREHENSIVE METABOLIC PANEL
ALT: 11 U/L (ref 0–44)
AST: 18 U/L (ref 15–41)
Albumin: 3.4 g/dL — ABNORMAL LOW (ref 3.5–5.0)
Alkaline Phosphatase: 54 U/L (ref 38–126)
Anion gap: 11 (ref 5–15)
BUN: 10 mg/dL (ref 8–23)
CO2: 26 mmol/L (ref 22–32)
Calcium: 9.6 mg/dL (ref 8.9–10.3)
Chloride: 100 mmol/L (ref 98–111)
Creatinine, Ser: 0.71 mg/dL (ref 0.44–1.00)
GFR calc Af Amer: >60 mL/min (ref >60)
GFR calc non Af Amer: >60 mL/min (ref >60)
Glucose, Bld: 186 mg/dL — ABNORMAL HIGH (ref 70–99)
Potassium: 3.4 mmol/L — ABNORMAL LOW (ref 3.5–5.1)
Sodium: 137 mmol/L (ref 135–145)
Total Bilirubin: 0.3 mg/dL (ref 0.3–1.2)
Total Protein: 8 g/dL (ref 6.5–8.1)

## 2018-09-29 LAB — PROTIME-INR
INR: 1.29
Prothrombin Time: 15.9 seconds — ABNORMAL HIGH (ref 11.4–15.2)

## 2018-09-29 LAB — LIPASE, BLOOD: Lipase: 23 U/L (ref 11–51)

## 2018-09-29 LAB — TROPONIN I: Troponin I: <0.03 ng/mL (ref <0.03)

## 2018-09-29 LAB — BRAIN NATRIURETIC PEPTIDE: B Natriuretic Peptide: 154.7 pg/mL — ABNORMAL HIGH (ref 0.0–100.0)

## 2018-09-29 LAB — PREPARE RBC (CROSSMATCH)

## 2018-09-29 MED ORDER — SODIUM CHLORIDE 0.9% IV SOLUTION
Freq: Once | INTRAVENOUS | Status: AC
  Administered 2018-09-30: 01:00:00 via INTRAVENOUS

## 2018-09-29 NOTE — ED Notes (Signed)
The pt takes xarelto

## 2018-09-29 NOTE — ED Triage Notes (Signed)
The p;t has had sob for ne month  She saw her doctor today and was found to have a hgb of 5.0 she has had this in the pastg with blood transfusions

## 2018-09-30 DIAGNOSIS — K922 Gastrointestinal hemorrhage, unspecified: Secondary | ICD-10-CM | POA: Diagnosis present

## 2018-09-30 DIAGNOSIS — Z8673 Personal history of transient ischemic attack (TIA), and cerebral infarction without residual deficits: Secondary | ICD-10-CM

## 2018-09-30 DIAGNOSIS — I10 Essential (primary) hypertension: Secondary | ICD-10-CM | POA: Diagnosis present

## 2018-09-30 DIAGNOSIS — K319 Disease of stomach and duodenum, unspecified: Secondary | ICD-10-CM | POA: Diagnosis present

## 2018-09-30 DIAGNOSIS — Z7901 Long term (current) use of anticoagulants: Secondary | ICD-10-CM | POA: Diagnosis not present

## 2018-09-30 DIAGNOSIS — K259 Gastric ulcer, unspecified as acute or chronic, without hemorrhage or perforation: Secondary | ICD-10-CM | POA: Diagnosis present

## 2018-09-30 DIAGNOSIS — D509 Iron deficiency anemia, unspecified: Principal | ICD-10-CM

## 2018-09-30 DIAGNOSIS — Z96651 Presence of right artificial knee joint: Secondary | ICD-10-CM | POA: Diagnosis present

## 2018-09-30 DIAGNOSIS — Z881 Allergy status to other antibiotic agents status: Secondary | ICD-10-CM | POA: Diagnosis not present

## 2018-09-30 DIAGNOSIS — E1149 Type 2 diabetes mellitus with other diabetic neurological complication: Secondary | ICD-10-CM | POA: Diagnosis present

## 2018-09-30 DIAGNOSIS — I480 Paroxysmal atrial fibrillation: Secondary | ICD-10-CM | POA: Diagnosis present

## 2018-09-30 DIAGNOSIS — Z7984 Long term (current) use of oral hypoglycemic drugs: Secondary | ICD-10-CM | POA: Diagnosis not present

## 2018-09-30 DIAGNOSIS — Z888 Allergy status to other drugs, medicaments and biological substances status: Secondary | ICD-10-CM | POA: Diagnosis not present

## 2018-09-30 DIAGNOSIS — E119 Type 2 diabetes mellitus without complications: Secondary | ICD-10-CM | POA: Diagnosis present

## 2018-09-30 DIAGNOSIS — D649 Anemia, unspecified: Secondary | ICD-10-CM | POA: Diagnosis present

## 2018-09-30 DIAGNOSIS — E876 Hypokalemia: Secondary | ICD-10-CM | POA: Diagnosis not present

## 2018-09-30 DIAGNOSIS — Z7982 Long term (current) use of aspirin: Secondary | ICD-10-CM | POA: Diagnosis not present

## 2018-09-30 DIAGNOSIS — Z8672 Personal history of thrombophlebitis: Secondary | ICD-10-CM | POA: Diagnosis not present

## 2018-09-30 DIAGNOSIS — Z952 Presence of prosthetic heart valve: Secondary | ICD-10-CM

## 2018-09-30 DIAGNOSIS — I471 Supraventricular tachycardia: Secondary | ICD-10-CM | POA: Diagnosis present

## 2018-09-30 DIAGNOSIS — E785 Hyperlipidemia, unspecified: Secondary | ICD-10-CM | POA: Diagnosis present

## 2018-09-30 DIAGNOSIS — Z79899 Other long term (current) drug therapy: Secondary | ICD-10-CM | POA: Diagnosis not present

## 2018-09-30 DIAGNOSIS — E0800 Diabetes mellitus due to underlying condition with hyperosmolarity without nonketotic hyperglycemic-hyperosmolar coma (NKHHC): Secondary | ICD-10-CM | POA: Diagnosis not present

## 2018-09-30 DIAGNOSIS — Z953 Presence of xenogenic heart valve: Secondary | ICD-10-CM | POA: Diagnosis not present

## 2018-09-30 DIAGNOSIS — I429 Cardiomyopathy, unspecified: Secondary | ICD-10-CM | POA: Diagnosis present

## 2018-09-30 DIAGNOSIS — E039 Hypothyroidism, unspecified: Secondary | ICD-10-CM | POA: Diagnosis present

## 2018-09-30 LAB — GLUCOSE, CAPILLARY
GLUCOSE-CAPILLARY: 171 mg/dL — AB (ref 70–99)
Glucose-Capillary: 158 mg/dL — ABNORMAL HIGH (ref 70–99)
Glucose-Capillary: 89 mg/dL (ref 70–99)
Glucose-Capillary: 87 mg/dL (ref 70–99)

## 2018-09-30 LAB — URINALYSIS, ROUTINE W REFLEX MICROSCOPIC
Bilirubin Urine: NEGATIVE
Glucose, UA: NEGATIVE mg/dL
Hgb urine dipstick: NEGATIVE
KETONES UR: NEGATIVE mg/dL
Leukocytes, UA: NEGATIVE
Nitrite: NEGATIVE
Protein, ur: NEGATIVE mg/dL
Specific Gravity, Urine: 1.013 (ref 1.005–1.030)
pH: 7 (ref 5.0–8.0)

## 2018-09-30 LAB — IRON AND TIBC
Iron: 133 ug/dL (ref 28–170)
Saturation Ratios: 29 % (ref 10.4–31.8)
TIBC: 463 ug/dL — ABNORMAL HIGH (ref 250–450)
UIBC: 330 ug/dL

## 2018-09-30 LAB — FERRITIN: Ferritin: 9 ng/mL — ABNORMAL LOW (ref 11–307)

## 2018-09-30 LAB — POC OCCULT BLOOD, ED: Fecal Occult Bld: POSITIVE — AB

## 2018-09-30 LAB — HEMOGLOBIN AND HEMATOCRIT, BLOOD
HCT: 25.2 % — ABNORMAL LOW (ref 36.0–46.0)
HCT: 28.6 % — ABNORMAL LOW (ref 36.0–46.0)
Hemoglobin: 7.2 g/dL — ABNORMAL LOW (ref 12.0–15.0)
Hemoglobin: 8.5 g/dL — ABNORMAL LOW (ref 12.0–15.0)

## 2018-09-30 LAB — PREPARE RBC (CROSSMATCH)

## 2018-09-30 LAB — FOLATE: FOLATE: 25.7 ng/mL (ref >5.9)

## 2018-09-30 LAB — APTT: aPTT: 38 seconds — ABNORMAL HIGH (ref 24–36)

## 2018-09-30 LAB — RETICULOCYTES
Immature Retic Fract: 12.9 % (ref 2.3–15.9)
RBC.: 3.91 MIL/uL (ref 3.87–5.11)
Retic Count, Absolute: 23 10*3/uL (ref 19.0–186.0)
Retic Ct Pct: 0.6 % (ref 0.4–3.1)

## 2018-09-30 LAB — VITAMIN B12: VITAMIN B 12: 114 pg/mL — AB (ref 180–914)

## 2018-09-30 LAB — HEPARIN LEVEL (UNFRACTIONATED): Heparin Unfractionated: 0.24 IU/mL — ABNORMAL LOW (ref 0.30–0.70)

## 2018-09-30 LAB — CBG MONITORING, ED: GLUCOSE-CAPILLARY: 106 mg/dL — AB (ref 70–99)

## 2018-09-30 MED ORDER — SIMVASTATIN 20 MG PO TABS
20.0000 mg | ORAL_TABLET | Freq: Every evening | ORAL | Status: DC
  Administered 2018-09-30 – 2018-10-03 (×4): 20 mg via ORAL
  Filled 2018-09-30 (×4): qty 1

## 2018-09-30 MED ORDER — ACETAMINOPHEN 325 MG PO TABS: 650.0000 mg | ORAL_TABLET | Freq: Four times a day (QID) | ORAL | Status: DC | PRN

## 2018-09-30 MED ORDER — AMLODIPINE BESYLATE 2.5 MG PO TABS
5.0000 mg | ORAL_TABLET | Freq: Every day | ORAL | Status: DC
  Administered 2018-09-30 – 2018-10-04 (×5): 5 mg via ORAL
  Filled 2018-09-30 (×5): qty 2

## 2018-09-30 MED ORDER — INSULIN ASPART 100 UNIT/ML ~~LOC~~ SOLN
0.0000 [IU] | SUBCUTANEOUS | Status: DC
  Administered 2018-09-30 (×2): 2 [IU] via SUBCUTANEOUS

## 2018-09-30 MED ORDER — ONDANSETRON HCL 4 MG/2ML IJ SOLN: 4.0000 mg | Freq: Four times a day (QID) | INTRAMUSCULAR | Status: DC | PRN

## 2018-09-30 MED ORDER — ACETAMINOPHEN 650 MG RE SUPP: 650.0000 mg | Freq: Four times a day (QID) | RECTAL | Status: DC | PRN

## 2018-09-30 MED ORDER — HEPARIN (PORCINE) 25000 UT/250ML-% IV SOLN
1000.0000 [IU]/h | INTRAVENOUS | Status: DC
  Administered 2018-09-30: 850 [IU]/h via INTRAVENOUS
  Filled 2018-09-30: qty 250

## 2018-09-30 MED ORDER — PANTOPRAZOLE SODIUM 40 MG IV SOLR
40.0000 mg | Freq: Two times a day (BID) | INTRAVENOUS | Status: DC
  Administered 2018-09-30 – 2018-10-04 (×8): 40 mg via INTRAVENOUS
  Filled 2018-09-30 (×9): qty 40

## 2018-09-30 MED ORDER — SODIUM CHLORIDE 0.9% IV SOLUTION
Freq: Once | INTRAVENOUS | Status: AC
  Administered 2018-09-30: 12:00:00 via INTRAVENOUS

## 2018-09-30 MED ORDER — METOPROLOL SUCCINATE ER 50 MG PO TB24
50.0000 mg | ORAL_TABLET | Freq: Every day | ORAL | Status: DC
  Administered 2018-09-30 – 2018-10-04 (×5): 50 mg via ORAL
  Filled 2018-09-30 (×6): qty 1

## 2018-09-30 MED ORDER — HYDROCHLOROTHIAZIDE 25 MG PO TABS
12.5000 mg | ORAL_TABLET | Freq: Every day | ORAL | Status: DC
  Administered 2018-09-30 – 2018-10-04 (×5): 12.5 mg via ORAL
  Filled 2018-09-30 (×5): qty 1

## 2018-09-30 MED ORDER — ONDANSETRON HCL 4 MG PO TABS: 4.0000 mg | ORAL_TABLET | Freq: Four times a day (QID) | ORAL | Status: DC | PRN

## 2018-09-30 MED ORDER — PEG 3350-KCL-NA BICARB-NACL 420 G PO SOLR
4000.0000 mL | Freq: Once | ORAL | Status: AC
  Administered 2018-10-01: 4000 mL via ORAL
  Filled 2018-09-30: qty 4000

## 2018-09-30 NOTE — Progress Notes (Signed)
[facility]                                                                              Patient Demographics  Janet Williamson, is a 77 y.o. female, DOB - 09/28/1941, TMA:[id number]  Admit date - 09/29/2018   Admitting Physician Hillary Bow, DO  Outpatient Primary MD for the patient is Maurice Small, MD  Outpatient specialists:   LOS - 0  days   Medical records reviewed and are as summarized below:    Chief Complaint  Patient presents with  . Shortness of Breath  . 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  Patient went to see her PCP, hemoglobin was 5, baseline 8-9.  Patient was sent to ED for further evaluation. FOBT positive.  Per patient no prior history of GIB.   Assessment & Plan    Principal Problem: Acute on chronic symptomatic anemia in the setting of anticoagulation with Xarelto, GI bleed -Baseline hemoglobin 8-9, presented with hemoglobin of 5.2, transfused 2 units packed RBCs, hb up to 7.2 -will transfuse 1 more unit packed RBCs.  Will check anemia panel. -GI consulted, will likely need endoscopy, placed on IV PPI  Active Problems:   DM (diabetes mellitus), type II, non-insulin-dependent, with neurological complications, history of prior stroke (HCC) -Hold metformin, continue sliding scale insulin -Hemoglobin A1c 6.9 in 04/2018, will check in a.m.    HTN (hypertension) -Continue Toprol-XL, HCTZ, amlodipine  History of prior embolic stroke in 2008 -Continue statin, hold aspirin and Xarelto due to #1    PAF (paroxysmal atrial fibrillation) (HCC) -Currently rate controlled, hold Xarelto due to GI bleed    S/P TAVR (transcatheter aortic valve replacement) in 04/2018 -Currently on aspirin 81 mg daily and Xarelto 20 mg daily,  reviewed cardiology office notes, by Dr. Anne Fu 08/2018, patient was to stop aspirin in January 2020 -Will discuss with cardiology regarding anticoagulation recommendations in the light of GI bleed  Hyperlipidemia Continue statin  Code Status: Full code DVT Prophylaxis: SCDs Family Communication: Discussed in detail with the patient, all imaging results, lab results explained to the patient, 2 daughters at the bedside   Disposition Plan: Awaiting GI evaluation  Time Spent in minutes 35 minutes  Procedures:  None  Consultants:   Gastroenterology  Antimicrobials:      Medications  Scheduled Meds: . sodium chloride   Intravenous Once  . amLODipine  5 mg Oral Daily  . hydrochlorothiazide  12.5 mg Oral Daily  . insulin aspart  0-9 Units Subcutaneous Q4H  . metoprolol succinate  50 mg Oral Daily  . simvastatin  20 mg Oral QPM   Continuous Infusions: PRN Meds:.acetaminophen **OR** acetaminophen, ondansetron **OR** ondansetron (ZOFRAN) IV   Antibiotics   Anti-infectives (From admission, onward)   None        Subjective:   Janet Williamson was seen and examined today.  Currently feeling better, no chest pain or shortness of  breath, dizziness.  Denies any frank hematemesis or hematochezia.  Denies any abdominal pain, nausea, vomiting, diarrhea or constipation.    Objective:   Vitals:   09/30/18 0618 09/30/18 0656 09/30/18 0941 09/30/18 0941  BP: (!) 150/68 (!) 145/66 (!) 147/62 (!) 147/62  Pulse: 77 75  80  Resp: 18 18    Temp: 98.4 F (36.9 C) 99.1 F (37.3 C)    TempSrc: Oral Oral    SpO2: 95% 98%  100%  Weight: 86.3 kg     Height: 5\' 2"  (1.575 m)       Intake/Output Summary (Last 24 hours) at 09/30/2018 0956 Last data filed at 09/30/2018 0345 Gross per 24 hour  Intake 1006.97 ml  Output -  Net 1006.97 ml     Wt Readings from Last 3 Encounters:  09/30/18 86.3 kg  08/25/18 85.6 kg  05/21/18 86.2 kg     Exam  General: Alert and oriented x 3,  NAD  Eyes:   HEENT:  Atraumatic, normocephalic, normal oropharynx  Cardiovascular: S1 S2 auscultated, 2/6 systolic murmur, regular  Respiratory: Clear to auscultation bilaterally  Gastrointestinal: Soft, nontender, nondistended, + bowel sounds  Ext: no pedal edema bilaterally  Neuro: No new deficits  Musculoskeletal: No digital cyanosis, clubbing  Skin: No rashes  Psych: Normal affect and demeanor, alert and oriented x3    Data Reviewed:  I have personally reviewed following labs and imaging studies  Micro Results No results found for this or any previous visit (from the past 240 hour(s)).  Radiology Reports Dg Chest 2 View  Result Date: 09/30/2018 CLINICAL DATA:  Dyspnea on exertion. History of aortic valve replacement. EXAM: CHEST - 2 VIEW COMPARISON:  Chest radiograph April 15, 2018 FINDINGS: Cardiac silhouette is mildly enlarged. Status post aortic valve replacement. Calcified aortic arch. Pulmonary vascular congestion without pleural effusion or focal consolidation. LEFT mid lung zone scarring. No pneumothorax. Soft tissue planes and included osseous structures are non suspicious. IMPRESSION: 1. Mild cardiomegaly and pulmonary vascular congestion. 2.  Aortic Atherosclerosis (ICD10-I70.0). Electronically Signed   By: Awilda Metroourtnay  Bloomer M.D.   On: 09/30/2018 00:22    Lab Data:  CBC: Recent Labs  Lab 09/29/18 2148 09/30/18 0719  WBC 12.5*  --   HGB 5.2* 7.2*  HCT 21.0* 25.2*  MCV 64.0*  --   PLT 530*  --    Basic Metabolic Panel: Recent Labs  Lab 09/29/18 2148  NA 137  K 3.4*  CL 100  CO2 26  GLUCOSE 186*  BUN 10  CREATININE 0.71  CALCIUM 9.6   GFR: Estimated Creatinine Clearance: 60.1 mL/min (by C-G formula based on SCr of 0.71 mg/dL). Liver Function Tests: Recent Labs  Lab 09/29/18 2148  AST 18  ALT 11  ALKPHOS 54  BILITOT 0.3  PROT 8.0  ALBUMIN 3.4*   Recent Labs  Lab 09/29/18 2148  LIPASE 23   No results for input(s): AMMONIA in the  last 168 hours. Coagulation Profile: Recent Labs  Lab 09/29/18 2148  INR 1.29   Cardiac Enzymes: Recent Labs  Lab 09/29/18 2148  TROPONINI <0.03   BNP (last 3 results) No results for input(s): PROBNP in the last 8760 hours. HbA1C: No results for input(s): HGBA1C in the last 72 hours. CBG: Recent Labs  Lab 09/30/18 0524 09/30/18 0929  GLUCAP 106* 171*   Lipid Profile: No results for input(s): CHOL, HDL, LDLCALC, TRIG, CHOLHDL, LDLDIRECT in the last 72 hours. Thyroid Function Tests: No results for input(s): TSH, T4TOTAL, FREET4,  T3FREE, THYROIDAB in the last 72 hours. Anemia Panel: No results for input(s): VITAMINB12, FOLATE, FERRITIN, TIBC, IRON, RETICCTPCT in the last 72 hours. Urine analysis:    Component Value Date/Time   COLORURINE YELLOW 09/30/2018 0013   APPEARANCEUR CLEAR 09/30/2018 0013   LABSPEC 1.013 09/30/2018 0013   PHURINE 7.0 09/30/2018 0013   GLUCOSEU NEGATIVE 09/30/2018 0013   GLUCOSEU NEGATIVE 12/30/2014 0758   HGBUR NEGATIVE 09/30/2018 0013   BILIRUBINUR NEGATIVE 09/30/2018 0013   KETONESUR NEGATIVE 09/30/2018 0013   PROTEINUR NEGATIVE 09/30/2018 0013   UROBILINOGEN 0.2 12/30/2014 0758   NITRITE NEGATIVE 09/30/2018 0013   LEUKOCYTESUR NEGATIVE 09/30/2018 0013     Janet Williamson M.D. [facility] 09/30/2018, 9:56 AM  Pager: [phone] Between 7am to 7pm - call Pager - [phone]  After 7pm go to [email or web address] - password [email or web address]  Call night coverage person covering after 7pm

## 2018-09-30 NOTE — Progress Notes (Deleted)
SATURATION QUALIFICATIONS: (This note is used to comply with regulatory documentation for home oxygen)  Patient Saturations on Room Air at Rest = 96%  Patient Saturations on Room Air while Ambulating = 95%  Patient ambulated today with mobility tech. Patient tolerated ambulation well.

## 2018-09-30 NOTE — Consult Note (Signed)
[facility] Consult  Referring Provider:Rai, Ripudeep K, MD  Primary Care Physician:  [name], [name], MD Primary Gastroenterologist: Dr.Magod([facility])  Reason for Consultation:  Symptomatic anemia  HPI: Janet Williamson is a 77 y.o. female presented to the ER after outpatient labs showed severe anemia with a hemoglobin around 5. Last colonoscopy was in 2009. Patient denies black stools, bloody bowel movements or vomiting blood. She has history of atrial fibrillation and stroke and was on Plavix until earlier this year when she was switched to Xarelto. Patient reports having coronary intervention in July of this year, feeling tired, short of breath, along with reduced appetite and weight loss of 10 pounds in the last 6 months. Patient denies easy bruising, epistaxis or vaginal bleeding. Patient denies difficulty swallowing, pain on swallowing, upper abdominal pain, nausea or vomiting but reports early satiety. No prior EGD.  As per patient she usually has one bowel movement a day and stools are brown. She denies loss of consciousness but complains of lightheadedness and has received 2 units of PRBC since transfusion. Last dose of Xarelto was on Sunday( 2 days ago). She also takes aspirin 81 mg a day, denies other NSAIDs use.  Past Medical History:  Diagnosis Date  . Anemia   . Cardiac mass    a. 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 Not seen on most recent TEE  . Cardiomyopathy in other disease   . Cataracts, bilateral   . Diabetes mellitus    Type 2  . Generalized osteoarthritis   . GERD (gastroesophageal reflux disease)   . HLD (hyperlipidemia)   . Hypertension   . Hypothyroidism   . PAF (paroxysmal atrial fibrillation) (HCC)    a. s/p DCCV 06/2017, on Xarelto  . Phlebitis   . S/P TAVR (transcatheter aortic valve replacement) 04/15/2018   Edwards Sapien 3 THV (size 23 mm, model # 9600TFX, serial # [id number]) via the TF approach  . Severe aortic stenosis    a.  s/p TAVR  . Stroke (HCC) 2008    Past Surgical History:  Procedure Laterality Date  . ABDOMINAL HYSTERECTOMY    . COLONOSCOPY    . EYE SURGERY Bilateral    cataract removal  . RIGHT/LEFT HEART CATH AND CORONARY ANGIOGRAPHY N/A 03/06/2018   Procedure: RIGHT/LEFT HEART CATH AND CORONARY ANGIOGRAPHY;  Surgeon: Smith, Henry W, MD;  Location: [facility];  Service: Cardiovascular;  Laterality: N/A;  . TEE WITHOUT CARDIOVERSION N/A 04/01/2018   Procedure: TRANSESOPHAGEAL ECHOCARDIOGRAM (TEE);  Surgeon: Skains, Mark C, MD;  Location: [facility];  Service: Cardiovascular;  Laterality: N/A;  . TEE WITHOUT CARDIOVERSION N/A 04/15/2018   Procedure: TRANSESOPHAGEAL ECHOCARDIOGRAM (TEE);  Surgeon: Cooper, Michael, MD;  Location: [facility] OR;  Service: Open Heart Surgery;  Laterality: N/A;  . TOTAL KNEE ARTHROPLASTY Right 04/14/2013   Dr Dean  . TOTAL KNEE ARTHROPLASTY Right 04/14/2013   Procedure: TOTAL KNEE ARTHROPLASTY;  Surgeon: Gregory Scott Dean, MD;  Location: [facility] OR;  Service: Orthopedics;  Laterality: Right;  . TRANSCATHETER AORTIC VALVE REPLACEMENT, TRANSFEMORAL  04/15/2018  . TRANSCATHETER AORTIC VALVE REPLACEMENT, TRANSFEMORAL Bilateral 04/15/2018   Procedure: TRANSCATHETER AORTIC VALVE REPLACEMENT, TRANSFEMORAL;  Surgeon: Cooper, Michael, MD;  Location: [facility] OR;  Service: Open Heart Surgery;  Laterality: Bilateral;    Prior to Admission medications   Medication Sig Start Date End Date Taking? Authorizing Provider  amLODipine (NORVASC) 5 MG tablet Take 5 mg by mouth daily.   Yes [provider]  aspirin EC 81 MG tablet Take 1 tablet (  81 mg total) by mouth daily. 04/17/18  Yes Thompson, Kathryn R, PA-C  Cholecalciferol (VITAMIN D3) 5000 units CAPS Take 5,000 Units by mouth daily.   Yes [provider]  glucose blood (ACCU-CHEK AVIVA) test strip Use as instructed to check once daily DX: E11.9 07/02/18  Yes Kumar, Ajay, MD  hydrochlorothiazide (HYDRODIURIL) 25 MG tablet Take 12.5 mg  by mouth daily.    Yes [provider]  metFORMIN (GLUCOPHAGE-XR) 750 MG 24 hr tablet TAKE 2 TABLETS BY MOUTH ONCE DAILY WITH BREAKFAST Patient taking differently: Take 1,500 mg by mouth daily with breakfast.  09/08/18  Yes Kumar, Ajay, MD  metoprolol succinate (TOPROL-XL) 50 MG 24 hr tablet Take 50 mg by mouth daily.    Yes [provider]  Polyethyl Glycol-Propyl Glycol (SYSTANE OP) Place 1 drop into both eyes 2 (two) times daily.    Yes [provider]  rivaroxaban (XARELTO) 20 MG TABS tablet Take 1 tablet (20 mg total) daily with supper by mouth. 08/26/17  Yes Skains, Mark C, MD  simvastatin (ZOCOR) 20 MG tablet Take 20 mg by mouth every evening.   Yes [provider]    Current Facility-Administered Medications  Medication Dose Route Frequency Provider Last Rate Last Dose  . 0.9 %  sodium chloride infusion (Manually program via Guardrails IV Fluids)   Intravenous Once Rai, Ripudeep K, MD      . acetaminophen (TYLENOL) tablet 650 mg  650 mg Oral Q6H PRN Gardner, Jared M, DO       Or  . acetaminophen (TYLENOL) suppository 650 mg  650 mg Rectal Q6H PRN Gardner, Jared M, DO      . amLODipine (NORVASC) tablet 5 mg  5 mg Oral Daily Gardner, Jared M, DO   5 mg at 09/30/18 0941  . hydrochlorothiazide (HYDRODIURIL) tablet 12.5 mg  12.5 mg Oral Daily Gardner, Jared M, DO   12.5 mg at 09/30/18 0942  . insulin aspart (novoLOG) injection 0-9 Units  0-9 Units Subcutaneous Q4H Gardner, Jared M, DO   2 Units at 09/30/18 0940  . metoprolol succinate (TOPROL-XL) 24 hr tablet 50 mg  50 mg Oral Daily Gardner, Jared M, DO      . ondansetron (ZOFRAN) tablet 4 mg  4 mg Oral Q6H PRN Gardner, Jared M, DO       Or  . ondansetron (ZOFRAN) injection 4 mg  4 mg Intravenous Q6H PRN Gardner, Jared M, DO      . pantoprazole (PROTONIX) injection 40 mg  40 mg Intravenous Q12H Rai, Ripudeep K, MD      . simvastatin (ZOCOR) tablet 20 mg  20 mg Oral QPM Gardner, Jared M, DO         Allergies as of 09/29/2018 - Review Complete 09/29/2018  Allergen Reaction Noted  . Ace inhibitors Other (See Comments) 10/05/2013  . Keflex [cephalexin] Swelling and Other (See Comments) 10/05/2013  . Rifadin [rifampin] Swelling and Other (See Comments) 10/05/2013    Family History  Problem Relation Age of Onset  . Stroke Mother   . Hypertension Mother   . Hypertension Father   . Heart attack Neg Hx     Social History   Socioeconomic History  . Marital status: Widowed    Spouse name: Not on file  . Number of children: Not on file  . Years of education: Not on file  . Highest education level: Not on file  Occupational History  . Not on file  Social Needs  . Financial   resource strain: Not on file  . Food insecurity:    Worry: Not on file    Inability: Not on file  . Transportation needs:    Medical: Not on file    Non-medical: Not on file  Tobacco Use  . Smoking status: Never Smoker  . Smokeless tobacco: Never Used  Substance and Sexual Activity  . Alcohol use: No  . Drug use: No  . Sexual activity: Not Currently  Lifestyle  . Physical activity:    Days per week: Not on file    Minutes per session: Not on file  . Stress: Not on file  Relationships  . Social connections:    Talks on phone: Not on file    Gets together: Not on file    Attends religious service: Not on file    Active member of club or organization: Not on file    Attends meetings of clubs or organizations: Not on file    Relationship status: Not on file  . Intimate partner violence:    Fear of current or ex partner: Not on file    Emotionally abused: Not on file    Physically abused: Not on file    Forced sexual activity: Not on file  Other Topics Concern  . Not on file  Social History Narrative  . Not on file    Review of Systems: Positive for: GI: Described in detail in HPI.    Gen: anorexia, fatigue, weakness, malaise, involuntary weight loss,Denies any fever, chills, rigors,  night sweats,  and sleep disorder CV: Denies chest pain, angina, palpitations, syncope, orthopnea, PND, peripheral edema, and claudication. Resp: dyspnea,  Deniescough, sputum, wheezing, coughing up blood. GU : Denies urinary burning, blood in urine, urinary frequency, urinary hesitancy, nocturnal urination, and urinary incontinence. MS: Denies joint pain or swelling.  Denies muscle weakness, cramps, atrophy.  Derm: Denies rash, itching, oral ulcerations, hives, unhealing ulcers.  Psych: Denies depression, anxiety, memory loss, suicidal ideation, hallucinations,  and confusion. Heme: Denies bruising, bleeding, and enlarged lymph nodes. Neuro:  dizziness,Denies any headaches,  paresthesias. Endo:  DM, thyroid,Denies any problems with  adrenal function.  Physical Exam: Vital signs in last 24 hours: Temp:  [97.8 F (36.6 C)-99.1 F (37.3 C)] 99.1 F (37.3 C) (12/24 0656) Pulse Rate:  [67-81] 80 (12/24 0941) Resp:  [15-21] 18 (12/24 0941) BP: (117-150)/(42-80) 147/62 (12/24 0941) SpO2:  [95 %-100 %] 100 % (12/24 0941) Weight:  [86.3 kg-86.6 kg] 86.3 kg (12/24 0618) Last BM Date: 09/28/18  General:   Alert,  Well-developed, well-nourished, pleasant and cooperative in NAD Head:  Normocephalic and atraumatic. Eyes:  Sclera clear, no icterus. Prominent pallor Ears:  Normal auditory acuity. Nose:  No deformity, discharge,  or lesions. Mouth:  No deformity or lesions.  Oropharynx pink & moist. Neck:  Supple; no masses or thyromegaly. Lungs:  Clear throughout to auscultation.   No wheezes, crackles, or rhonchi. No acute distress. Heart:  Regular rate and rhythm; no murmurs, clicks, rubs,  or gallops. Extremities:  Without clubbing or edema. Neurologic:  Alert and  oriented x4;  grossly normal neurologically. Skin:  Intact without significant lesions or rashes. Psych:  Alert and cooperative. Normal mood and affect. Abdomen:  Soft, nontender and nondistended. No masses, hepatosplenomegaly or  hernias noted. Normal bowel sounds, without guarding, and without rebound.         Lab Results: Recent Labs    09/29/18 2148 09/30/18 0719  WBC 12.5*  --   HGB 5.2* 7.2*    HCT 21.0* 25.2*  PLT 530*  --    BMET Recent Labs    09/29/18 2148  NA 137  K 3.4*  CL 100  CO2 26  GLUCOSE 186*  BUN 10  CREATININE 0.71  CALCIUM 9.6   LFT Recent Labs    09/29/18 2148  PROT 8.0  ALBUMIN 3.4*  AST 18  ALT 11  ALKPHOS 54  BILITOT 0.3   PT/INR Recent Labs    09/29/18 2148  LABPROT 15.9*  INR 1.29    Studies/Results: Dg Chest 2 View  Result Date: 09/30/2018 CLINICAL DATA:  Dyspnea on exertion. History of aortic valve replacement. EXAM: CHEST - 2 VIEW COMPARISON:  Chest radiograph April 15, 2018 FINDINGS: Cardiac silhouette is mildly enlarged. Status post aortic valve replacement. Calcified aortic arch. Pulmonary vascular congestion without pleural effusion or focal consolidation. LEFT mid lung zone scarring. No pneumothorax. Soft tissue planes and included osseous structures are non suspicious. IMPRESSION: 1. Mild cardiomegaly and pulmonary vascular congestion. 2.  Aortic Atherosclerosis (ICD10-I70.0). Electronically Signed   By: Courtnay  Bloomer M.D.   On: 09/30/2018 00:22    Impression: Severe symptomatic microcytic anemia,ferritin of 9,hemoglobin has improved from 5.2-7.2 post 2 units PRBC transfusion, remains hemodynamically stable,FOBT positive She has a normal BUNs/creatinine ratio, low vitamin B12  Plan: EGD and colonoscopy on Thursday, he needs to be at least 3 days off Xarelto. Okay to start IV heparin for history of atrial fibrillation and stroke, with the plan to stop it 6 hours prior to procedure, and if there is evidence of melena/hematochezia. We'll start patient on clear liquid diet, colonic prep to be given from tomorrow morning, nothing by mouth past midnight tomorrow night. The risks and benefits of the procedure were discussed with the patient and her 2  daughters at bedside. They understand and verbalized consent. There is no obvious source noted on EGD or colonoscopy, she will likely need a small bowel capsule endoscopy as the next step.   LOS: 0 days   Raegan Sipp, M.D. 09/30/2018, 11:12 AM  Pager [phone] If no answer or after 5 PM call [phone] 

## 2018-09-30 NOTE — ED Notes (Signed)
MD Bebe Shaggy) notified about patient previous blood administration was hanged with 0.45 saline. Per MD," if patient does not have any distress", then RN should continue with administration. RN went ahead and paused administration and got iv line flushed and tubing changed. Therefore blood administration restarted. Will keep monitoring.

## 2018-09-30 NOTE — Plan of Care (Signed)
Continue to monitor for patient safety.

## 2018-09-30 NOTE — H&P (View-Only) (Signed)
[facility] Gastroenterology Consult  Referring Provider:Rai, Delene Ruffini, MD  Primary Care Physician:  Maurice Small, MD Primary Gastroenterologist: Dr.Magod([facility])  Reason for Consultation:  Symptomatic anemia  HPI: Janet Williamson is a 77 y.o. female presented to the ER after outpatient labs showed severe anemia with a hemoglobin around 5. Last colonoscopy was in 2009. Patient denies black stools, bloody bowel movements or vomiting blood. She has history of atrial fibrillation and stroke and was on Plavix until earlier this year when she was switched to Xarelto. Patient reports having coronary intervention in July of this year, feeling tired, short of breath, along with reduced appetite and weight loss of 10 pounds in the last 6 months. Patient denies easy bruising, epistaxis or vaginal bleeding. Patient denies difficulty swallowing, pain on swallowing, upper abdominal pain, nausea or vomiting but reports early satiety. No prior EGD.  As per patient she usually has one bowel movement a day and stools are brown. She denies loss of consciousness but complains of lightheadedness and has received 2 units of PRBC since transfusion. Last dose of Xarelto was on Sunday( 2 days ago). She also takes aspirin 81 mg a day, denies other NSAIDs use.  Past Medical History:  Diagnosis Date  . Anemia   . Cardiac mass    a. 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 Not seen on most recent TEE  . Cardiomyopathy in other disease   . Cataracts, bilateral   . Diabetes mellitus    Type 2  . Generalized osteoarthritis   . GERD (gastroesophageal reflux disease)   . HLD (hyperlipidemia)   . Hypertension   . Hypothyroidism   . PAF (paroxysmal atrial fibrillation) (HCC)    a. s/p DCCV 06/2017, on Xarelto  . Phlebitis   . S/P TAVR (transcatheter aortic valve replacement) 04/15/2018   Edwards Sapien 3 THV (size 23 mm, model # 9600TFX, serial # [id number]) via the TF approach  . Severe aortic stenosis    a.  s/p TAVR  . Stroke [facility]) 2008    Past Surgical History:  Procedure Laterality Date  . ABDOMINAL HYSTERECTOMY    . COLONOSCOPY    . EYE SURGERY Bilateral    cataract removal  . RIGHT/LEFT HEART CATH AND CORONARY ANGIOGRAPHY N/A 03/06/2018   Procedure: RIGHT/LEFT HEART CATH AND CORONARY ANGIOGRAPHY;  Surgeon: Lyn Records, MD;  Location: [facility] INVASIVE CV LAB;  Service: Cardiovascular;  Laterality: N/A;  . TEE WITHOUT CARDIOVERSION N/A 04/01/2018   Procedure: TRANSESOPHAGEAL ECHOCARDIOGRAM (TEE);  Surgeon: Jake Bathe, MD;  Location: [facility] ENDOSCOPY;  Service: Cardiovascular;  Laterality: N/A;  . TEE WITHOUT CARDIOVERSION N/A 04/15/2018   Procedure: TRANSESOPHAGEAL ECHOCARDIOGRAM (TEE);  Surgeon: Tonny Bollman, MD;  Location: [facility] OR;  Service: Open Heart Surgery;  Laterality: N/A;  . TOTAL KNEE ARTHROPLASTY Right 04/14/2013   Dr August Saucer  . TOTAL KNEE ARTHROPLASTY Right 04/14/2013   Procedure: TOTAL KNEE ARTHROPLASTY;  Surgeon: Cammy Copa, MD;  Location: [facility] OR;  Service: Orthopedics;  Laterality: Right;  . TRANSCATHETER AORTIC VALVE REPLACEMENT, TRANSFEMORAL  04/15/2018  . TRANSCATHETER AORTIC VALVE REPLACEMENT, TRANSFEMORAL Bilateral 04/15/2018   Procedure: TRANSCATHETER AORTIC VALVE REPLACEMENT, TRANSFEMORAL;  Surgeon: Tonny Bollman, MD;  Location: [facility] OR;  Service: Open Heart Surgery;  Laterality: Bilateral;    Prior to Admission medications   Medication Sig Start Date End Date Taking? Authorizing Provider  amLODipine (NORVASC) 5 MG tablet Take 5 mg by mouth daily.   Yes [provider]  aspirin EC 81 MG tablet Take 1 tablet (  81 mg total) by mouth daily. 04/17/18  Yes Janetta Horahompson, Kathryn R, PA-C  Cholecalciferol (VITAMIN D3) 5000 units CAPS Take 5,000 Units by mouth daily.   Yes [provider]  glucose blood (ACCU-CHEK AVIVA) test strip Use as instructed to check once daily DX: E11.9 07/02/18  Yes Reather LittlerKumar, Ajay, MD  hydrochlorothiazide (HYDRODIURIL) 25 MG tablet Take 12.5 mg  by mouth daily.    Yes [provider]  metFORMIN (GLUCOPHAGE-XR) 750 MG 24 hr tablet TAKE 2 TABLETS BY MOUTH ONCE DAILY WITH BREAKFAST Patient taking differently: Take 1,500 mg by mouth daily with breakfast.  09/08/18  Yes Reather LittlerKumar, Ajay, MD  metoprolol succinate (TOPROL-XL) 50 MG 24 hr tablet Take 50 mg by mouth daily.    Yes [provider]  Polyethyl Glycol-Propyl Glycol (SYSTANE OP) Place 1 drop into both eyes 2 (two) times daily.    Yes [provider]  rivaroxaban (XARELTO) 20 MG TABS tablet Take 1 tablet (20 mg total) daily with supper by mouth. 08/26/17  Yes Skains, Veverly FellsMark C, MD  simvastatin (ZOCOR) 20 MG tablet Take 20 mg by mouth every evening.   Yes [provider]    Current Facility-Administered Medications  Medication Dose Route Frequency Provider Last Rate Last Dose  . 0.9 %  sodium chloride infusion (Manually program via Guardrails IV Fluids)   Intravenous Once Rai, Ripudeep K, MD      . acetaminophen (TYLENOL) tablet 650 mg  650 mg Oral Q6H PRN Hillary BowGardner, Jared M, DO       Or  . acetaminophen (TYLENOL) suppository 650 mg  650 mg Rectal Q6H PRN Hillary BowGardner, Jared M, DO      . amLODipine (NORVASC) tablet 5 mg  5 mg Oral Daily Lyda PeroneGardner, Jared M, DO   5 mg at 09/30/18 0941  . hydrochlorothiazide (HYDRODIURIL) tablet 12.5 mg  12.5 mg Oral Daily Lyda PeroneGardner, Jared M, DO   12.5 mg at 09/30/18 [id number]  . insulin aspart (novoLOG) injection 0-9 Units  0-9 Units Subcutaneous Q4H Hillary BowGardner, Jared M, DO   2 Units at 09/30/18 0940  . metoprolol succinate (TOPROL-XL) 24 hr tablet 50 mg  50 mg Oral Daily Julian ReilGardner, Jared M, DO      . ondansetron [facility](ZOFRAN) tablet 4 mg  4 mg Oral Q6H PRN Hillary BowGardner, Jared M, DO       Or  . ondansetron [facility](ZOFRAN) injection 4 mg  4 mg Intravenous Q6H PRN Hillary BowGardner, Jared M, DO      . pantoprazole (PROTONIX) injection 40 mg  40 mg Intravenous Q12H Rai, Ripudeep K, MD      . simvastatin (ZOCOR) tablet 20 mg  20 mg Oral QPM Hillary BowGardner, Jared M, DO         Allergies as of 09/29/2018 - Review Complete 09/29/2018  Allergen Reaction Noted  . Ace inhibitors Other (See Comments) 10/05/2013  . Keflex [cephalexin] Swelling and Other (See Comments) 10/05/2013  . Rifadin [rifampin] Swelling and Other (See Comments) 10/05/2013    Family History  Problem Relation Age of Onset  . Stroke Mother   . Hypertension Mother   . Hypertension Father   . Heart attack Neg Hx     Social History   Socioeconomic History  . Marital status: Widowed    Spouse name: Not on file  . Number of children: Not on file  . Years of education: Not on file  . Highest education level: Not on file  Occupational History  . Not on file  Social Needs  . Financial  resource strain: Not on file  . Food insecurity:    Worry: Not on file    Inability: Not on file  . Transportation needs:    Medical: Not on file    Non-medical: Not on file  Tobacco Use  . Smoking status: Never Smoker  . Smokeless tobacco: Never Used  Substance and Sexual Activity  . Alcohol use: No  . Drug use: No  . Sexual activity: Not Currently  Lifestyle  . Physical activity:    Days per week: Not on file    Minutes per session: Not on file  . Stress: Not on file  Relationships  . Social connections:    Talks on phone: Not on file    Gets together: Not on file    Attends religious service: Not on file    Active member of club or organization: Not on file    Attends meetings of clubs or organizations: Not on file    Relationship status: Not on file  . Intimate partner violence:    Fear of current or ex partner: Not on file    Emotionally abused: Not on file    Physically abused: Not on file    Forced sexual activity: Not on file  Other Topics Concern  . Not on file  Social History Narrative  . Not on file    Review of Systems: Positive for: GI: Described in detail in HPI.    Gen: anorexia, fatigue, weakness, malaise, involuntary weight loss,Denies any fever, chills, rigors,  night sweats,  and sleep disorder CV: Denies chest pain, angina, palpitations, syncope, orthopnea, PND, peripheral edema, and claudication. Resp: dyspnea,  Deniescough, sputum, wheezing, coughing up blood. GU : Denies urinary burning, blood in urine, urinary frequency, urinary hesitancy, nocturnal urination, and urinary incontinence. MS: Denies joint pain or swelling.  Denies muscle weakness, cramps, atrophy.  Derm: Denies rash, itching, oral ulcerations, hives, unhealing ulcers.  Psych: Denies depression, anxiety, memory loss, suicidal ideation, hallucinations,  and confusion. Heme: Denies bruising, bleeding, and enlarged lymph nodes. Neuro:  dizziness,Denies any headaches,  paresthesias. Endo:  DM, thyroid,Denies any problems with  adrenal function.  Physical Exam: Vital signs in last 24 hours: Temp:  [97.8 F (36.6 C)-99.1 F (37.3 C)] 99.1 F (37.3 C) (12/24 0656) Pulse Rate:  [67-81] 80 (12/24 0941) Resp:  [15-21] 18 (12/24 0941) BP: (117-150)/(42-80) 147/62 (12/24 0941) SpO2:  [95 %-100 %] 100 % (12/24 0941) Weight:  [86.3 kg-86.6 kg] 86.3 kg (12/24 0618) Last BM Date: 09/28/18  General:   Alert,  Well-developed, well-nourished, pleasant and cooperative in NAD Head:  Normocephalic and atraumatic. Eyes:  Sclera clear, no icterus. Prominent pallor Ears:  Normal auditory acuity. Nose:  No deformity, discharge,  or lesions. Mouth:  No deformity or lesions.  Oropharynx pink & moist. Neck:  Supple; no masses or thyromegaly. Lungs:  Clear throughout to auscultation.   No wheezes, crackles, or rhonchi. No acute distress. Heart:  Regular rate and rhythm; no murmurs, clicks, rubs,  or gallops. Extremities:  Without clubbing or edema. Neurologic:  Alert and  oriented x4;  grossly normal neurologically. Skin:  Intact without significant lesions or rashes. Psych:  Alert and cooperative. Normal mood and affect. Abdomen:  Soft, nontender and nondistended. No masses, hepatosplenomegaly or  hernias noted. Normal bowel sounds, without guarding, and without rebound.         Lab Results: Recent Labs    09/29/18 2148 09/30/18 0719  WBC 12.5*  --   HGB 5.2* 7.2*  HCT 21.0* 25.2*  PLT 530*  --    BMET Recent Labs    09/29/18 2148  NA 137  K 3.4*  CL 100  CO2 26  GLUCOSE 186*  BUN 10  CREATININE 0.71  CALCIUM 9.6   LFT Recent Labs    09/29/18 2148  PROT 8.0  ALBUMIN 3.4*  AST 18  ALT 11  ALKPHOS 54  BILITOT 0.3   PT/INR Recent Labs    09/29/18 2148  LABPROT 15.9*  INR 1.29    Studies/Results: Dg Chest 2 View  Result Date: 09/30/2018 CLINICAL DATA:  Dyspnea on exertion. History of aortic valve replacement. EXAM: CHEST - 2 VIEW COMPARISON:  Chest radiograph April 15, 2018 FINDINGS: Cardiac silhouette is mildly enlarged. Status post aortic valve replacement. Calcified aortic arch. Pulmonary vascular congestion without pleural effusion or focal consolidation. LEFT mid lung zone scarring. No pneumothorax. Soft tissue planes and included osseous structures are non suspicious. IMPRESSION: 1. Mild cardiomegaly and pulmonary vascular congestion. 2.  Aortic Atherosclerosis (ICD10-I70.0). Electronically Signed   By: Awilda Metro M.D.   On: 09/30/2018 00:22    Impression: Severe symptomatic microcytic anemia,ferritin of 9,hemoglobin has improved from 5.2-7.2 post 2 units PRBC transfusion, remains hemodynamically stable,FOBT positive She has a normal BUNs/creatinine ratio, low vitamin B12  Plan: EGD and colonoscopy on Thursday, he needs to be at least 3 days off Xarelto. Okay to start IV heparin for history of atrial fibrillation and stroke, with the plan to stop it 6 hours prior to procedure, and if there is evidence of melena/hematochezia. We'll start patient on clear liquid diet, colonic prep to be given from tomorrow morning, nothing by mouth past midnight tomorrow night. The risks and benefits of the procedure were discussed with the patient and her 2  daughters at bedside. They understand and verbalized consent. There is no obvious source noted on EGD or colonoscopy, she will likely need a small bowel capsule endoscopy as the next step.   LOS: 0 days   Kerin Salen, M.D. 09/30/2018, 11:12 AM  Pager [phone] If no answer or after 5 PM call [phone]

## 2018-09-30 NOTE — Progress Notes (Signed)
ANTICOAGULATION CONSULT NOTE   Pharmacy Consult for heparin Indication: atrial fibrillation  Allergies  Allergen Reactions  . Ace Inhibitors Other (See Comments)    angioedema  . Keflex [Cephalexin] Swelling and Other (See Comments)    lips  . Rifadin [Rifampin] Swelling and Other (See Comments)    lips    Patient Measurements: Height: 5\' 2"  (157.5 cm) Weight: 190 lb 3.2 oz (86.3 kg) IBW/kg (Calculated) : 50.1 HEPARIN DW (KG): 69.7  Vital Signs: Temp: 99 F (37.2 C) (12/24 2032) Temp Source: Oral (12/24 2032) BP: 155/67 (12/24 2032) Pulse Rate: 77 (12/24 2032)  Labs: Recent Labs    09/29/18 2148 09/30/18 0719 09/30/18 1608 09/30/18 2206  HGB 5.2* 7.2* 8.5*  --   HCT 21.0* 25.2* 28.6*  --   PLT 530*  --   --   --   APTT  --   --   --  38*  LABPROT 15.9*  --   --   --   INR 1.29  --   --   --   HEPARINUNFRC  --   --   --  0.24*  CREATININE 0.71  --   --   --   TROPONINI <0.03  --   --   --     Estimated Creatinine Clearance: 60.1 mL/min (by C-G formula based on SCr of 0.71 mg/dL).   Assessment: Janet Williamson is a 77yo female admitted with SOB, outpt labs with PCP resulted in hgb of 5. Patient on xarelto PTA for pAF, last dose 12/22. Pharmacy consulted to dose heparin.  Plan is for EGD Thursday, stopping heparin 6h before. Hgb 7.2 and pltc 530. No overt bleeding but FOBT positive.  Initial heparin level 0.24 units/ml, aPTT 38 sec.  Will use heparin levels to guide therapy   Goal of Therapy:  Heparin level 0.3-0.7 units/ml Monitor platelets by anticoagulation protocol: Yes   Plan:  Increase heparin 1000 units/hr Monitor daily CBC, heparin levels, s/sx of bleeding  Thanks for allowing pharmacy to be a part of this patient's care.  Talbert Cage, PharmD Clinical Pharmacist

## 2018-09-30 NOTE — H&P (Signed)
History and Physical    Janet Williamson:[id number] DOB: 1941-10-03 DOA: 09/29/2018  PCP: Maurice Small, MD  Patient coming from: Home  I have personally briefly reviewed patient's old medical records in [facility]  Chief Complaint: SOB  HPI: Janet Williamson is a 77 y.o. female with medical history significant of PAF, embolic stroke on Xarelto, TAVR, DM2, HTN.  Patient presents to the ED with c/o SOB.  Symptoms onset over the past few days.  Went to see PCP today at [facility].  CBC showed HGB in the 5 range.  Does have h/o anemia but usually in the 8-9 range and had been stable.  Sent to ED for further eval.   ED Course: Hemoccult positive stools though no gross melena nor hematochezia.  Patient doesn't report any blood in stools nor black tarry stools.  No prior h/o GIB.  No CP, no dizziness.   Review of Systems: As per HPI otherwise 10 point review of systems negative.   Past Medical History:  Diagnosis Date  . Anemia   . Cardiac mass    a. 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 Not seen on most recent TEE  . Cardiomyopathy in other disease   . Cataracts, bilateral   . Diabetes mellitus    Type 2  . Generalized osteoarthritis   . GERD (gastroesophageal reflux disease)   . HLD (hyperlipidemia)   . Hypertension   . Hypothyroidism   . PAF (paroxysmal atrial fibrillation) (HCC)    a. s/p DCCV 06/2017, on Xarelto  . Phlebitis   . S/P TAVR (transcatheter aortic valve replacement) 04/15/2018   Edwards Sapien 3 THV (size 23 mm, model # 9600TFX, serial # [id number]) via the TF approach  . Severe aortic stenosis    a. s/p TAVR  . Stroke [facility]) 2008    Past Surgical History:  Procedure Laterality Date  . ABDOMINAL HYSTERECTOMY    . COLONOSCOPY    . EYE SURGERY Bilateral    cataract removal  . RIGHT/LEFT HEART CATH AND CORONARY ANGIOGRAPHY N/A 03/06/2018   Procedure: RIGHT/LEFT HEART CATH AND CORONARY ANGIOGRAPHY;  Surgeon: Lyn Records, MD;  Location: [facility] INVASIVE  CV LAB;  Service: Cardiovascular;  Laterality: N/A;  . TEE WITHOUT CARDIOVERSION N/A 04/01/2018   Procedure: TRANSESOPHAGEAL ECHOCARDIOGRAM (TEE);  Surgeon: Jake Bathe, MD;  Location: [facility] ENDOSCOPY;  Service: Cardiovascular;  Laterality: N/A;  . TEE WITHOUT CARDIOVERSION N/A 04/15/2018   Procedure: TRANSESOPHAGEAL ECHOCARDIOGRAM (TEE);  Surgeon: Tonny Bollman, MD;  Location: [facility] OR;  Service: Open Heart Surgery;  Laterality: N/A;  . TOTAL KNEE ARTHROPLASTY Right 04/14/2013   Dr August Saucer  . TOTAL KNEE ARTHROPLASTY Right 04/14/2013   Procedure: TOTAL KNEE ARTHROPLASTY;  Surgeon: Cammy Copa, MD;  Location: [facility] OR;  Service: Orthopedics;  Laterality: Right;  . TRANSCATHETER AORTIC VALVE REPLACEMENT, TRANSFEMORAL  04/15/2018  . TRANSCATHETER AORTIC VALVE REPLACEMENT, TRANSFEMORAL Bilateral 04/15/2018   Procedure: TRANSCATHETER AORTIC VALVE REPLACEMENT, TRANSFEMORAL;  Surgeon: Tonny Bollman, MD;  Location: [facility] OR;  Service: Open Heart Surgery;  Laterality: Bilateral;     reports that she has never smoked. She has never used smokeless tobacco. She reports that she does not drink alcohol or use drugs.  Allergies  Allergen Reactions  . Ace Inhibitors Other (See Comments)    angioedema  . Keflex [Cephalexin] Swelling and Other (See Comments)    lips  . Rifadin [Rifampin] Swelling and Other (See Comments)    lips    Family History  Problem Relation Age  of Onset  . Stroke Mother   . Hypertension Mother   . Hypertension Father   . Heart attack Neg Hx      Prior to Admission medications   Medication Sig Start Date End Date Taking? Authorizing Provider  amLODipine (NORVASC) 5 MG tablet Take 5 mg by mouth daily.   Yes [provider]  aspirin EC 81 MG tablet Take 1 tablet (81 mg total) by mouth daily. 04/17/18  Yes Janetta Hora, PA-C  Cholecalciferol (VITAMIN D3) 5000 units CAPS Take 5,000 Units by mouth daily.   Yes [provider]  glucose blood (ACCU-CHEK AVIVA)  test strip Use as instructed to check once daily DX: E11.9 07/02/18  Yes Reather Littler, MD  hydrochlorothiazide (HYDRODIURIL) 25 MG tablet Take 12.5 mg by mouth daily.    Yes [provider]  metFORMIN (GLUCOPHAGE-XR) 750 MG 24 hr tablet TAKE 2 TABLETS BY MOUTH ONCE DAILY WITH BREAKFAST Patient taking differently: Take 1,500 mg by mouth daily with breakfast.  09/08/18  Yes Reather Littler, MD  metoprolol succinate (TOPROL-XL) 50 MG 24 hr tablet Take 50 mg by mouth daily.    Yes [provider]  Polyethyl Glycol-Propyl Glycol (SYSTANE OP) Place 1 drop into both eyes 2 (two) times daily.    Yes [provider]  rivaroxaban (XARELTO) 20 MG TABS tablet Take 1 tablet (20 mg total) daily with supper by mouth. 08/26/17  Yes Skains, Veverly Fells, MD  simvastatin (ZOCOR) 20 MG tablet Take 20 mg by mouth every evening.   Yes [provider]    Physical Exam: Vitals:   09/30/18 0230 09/30/18 0345 09/30/18 0415 09/30/18 0448  BP: 135/80 138/63 138/61 136/66  Pulse: 71 68 69 68  Resp: 19 19 18 16   Temp:  98.2 F (36.8 C) 98.4 F (36.9 C) 97.8 F (36.6 C)  TempSrc:  Oral Oral Oral  SpO2: 100% 99%    Weight:      Height:        Constitutional: NAD, calm, comfortable Eyes: PERRL, lids and conjunctivae normal ENMT: Mucous membranes are moist. Posterior pharynx clear of any exudate or lesions.Normal dentition.  Neck: normal, supple, no masses, no thyromegaly Respiratory: clear to auscultation bilaterally, no wheezing, no crackles. Normal respiratory effort. No accessory muscle use.  Cardiovascular: Regular rate and rhythm, no murmurs / rubs / gallops. No extremity edema. 2+ pedal pulses. No carotid bruits.  Abdomen: no tenderness, no masses palpated. No hepatosplenomegaly. Bowel sounds positive.  Musculoskeletal: no clubbing / cyanosis. No joint deformity upper and lower extremities. Good ROM, no contractures. Normal muscle tone.  Skin: no rashes, lesions, ulcers. No  induration Neurologic: CN 2-12 grossly intact. Sensation intact, DTR normal. Strength 5/5 in all 4.  Psychiatric: Normal judgment and insight. Alert and oriented x 3. Normal mood.    Labs on Admission: I have personally reviewed following labs and imaging studies  CBC: Recent Labs  Lab 09/29/18 2148  WBC 12.5*  HGB 5.2*  HCT 21.0*  MCV 64.0*  PLT 530*   Basic Metabolic Panel: Recent Labs  Lab 09/29/18 2148  NA 137  K 3.4*  CL 100  CO2 26  GLUCOSE 186*  BUN 10  CREATININE 0.71  CALCIUM 9.6   GFR: Estimated Creatinine Clearance: 60.2 mL/min (by C-G formula based on SCr of 0.71 mg/dL). Liver Function Tests: Recent Labs  Lab 09/29/18 2148  AST 18  ALT 11  ALKPHOS 54  BILITOT 0.3  PROT 8.0  ALBUMIN 3.4*  Recent Labs  Lab 09/29/18 2148  LIPASE 23   No results for input(s): AMMONIA in the last 168 hours. Coagulation Profile: Recent Labs  Lab 09/29/18 2148  INR 1.29   Cardiac Enzymes: Recent Labs  Lab 09/29/18 2148  TROPONINI <0.03   BNP (last 3 results) No results for input(s): PROBNP in the last 8760 hours. HbA1C: No results for input(s): HGBA1C in the last 72 hours. CBG: No results for input(s): GLUCAP in the last 168 hours. Lipid Profile: No results for input(s): CHOL, HDL, LDLCALC, TRIG, CHOLHDL, LDLDIRECT in the last 72 hours. Thyroid Function Tests: No results for input(s): TSH, T4TOTAL, FREET4, T3FREE, THYROIDAB in the last 72 hours. Anemia Panel: No results for input(s): VITAMINB12, FOLATE, FERRITIN, TIBC, IRON, RETICCTPCT in the last 72 hours. Urine analysis:    Component Value Date/Time   COLORURINE YELLOW 09/30/2018 0013   APPEARANCEUR CLEAR 09/30/2018 0013   LABSPEC 1.013 09/30/2018 0013   PHURINE 7.0 09/30/2018 0013   GLUCOSEU NEGATIVE 09/30/2018 0013   GLUCOSEU NEGATIVE 12/30/2014 0758   HGBUR NEGATIVE 09/30/2018 0013   BILIRUBINUR NEGATIVE 09/30/2018 0013   KETONESUR NEGATIVE 09/30/2018 0013   PROTEINUR NEGATIVE 09/30/2018  0013   UROBILINOGEN 0.2 12/30/2014 0758   NITRITE NEGATIVE 09/30/2018 0013   LEUKOCYTESUR NEGATIVE 09/30/2018 0013    Radiological Exams on Admission: Dg Chest 2 View  Result Date: 09/30/2018 CLINICAL DATA:  Dyspnea on exertion. History of aortic valve replacement. EXAM: CHEST - 2 VIEW COMPARISON:  Chest radiograph April 15, 2018 FINDINGS: Cardiac silhouette is mildly enlarged. Status post aortic valve replacement. Calcified aortic arch. Pulmonary vascular congestion without pleural effusion or focal consolidation. LEFT mid lung zone scarring. No pneumothorax. Soft tissue planes and included osseous structures are non suspicious. IMPRESSION: 1. Mild cardiomegaly and pulmonary vascular congestion. 2.  Aortic Atherosclerosis (ICD10-I70.0). Electronically Signed   By: Awilda Metro M.D.   On: 09/30/2018 00:22    EKG: Independently reviewed.  Assessment/Plan Principal Problem:   Symptomatic anemia Active Problems:   DM (diabetes mellitus) (HCC)   HTN (hypertension)   History of stroke   PAF (paroxysmal atrial fibrillation) (HCC)   S/P TAVR (transcatheter aortic valve replacement)    1. Symptomatic anemia - 1. Transfusing 2u PRBC 2. Repeat H/H in AM 3. Call GI in AM 4. Clear liquid diet 5. Holding Xarelto, last dose was Sunday per patient 2. PAF and h/o embolic stroke - 1. Having to hold Xarelto in setting of GIB with anemia 2. Does seem somewhat subacute 3. But would want to get GI input before resuming Xarelto 3. DM2 - 1. Hold metformin 2. Sensitive SSI Q4H 4. HTN - continue home BP meds  DVT prophylaxis: SCDs Code Status: Full Family Communication: No family in room Disposition Plan: Home after admit Consults called: None, call GI in AM Admission status: Admit to inpatient  Severity of Illness: The appropriate patient status for this patient is INPATIENT. Inpatient status is judged to be reasonable and necessary in order to provide the required intensity of service to  ensure the patient's safety. The patient's presenting symptoms, physical exam findings, and initial radiographic and laboratory data in the context of their chronic comorbidities is felt to place them at high risk for further clinical deterioration. Furthermore, it is not anticipated that the patient will be medically stable for discharge from the hospital within 2 midnights of admission. The following factors support the patient status of inpatient.   " The patient's presenting symptoms include SOB, HGB 5. " The  initial radiographic and laboratory data are worrisome because of HGB 5.2, hemoccult positive. " The chronic co-morbidities include PAF with h/o embolic stroke, on chronic Xarelto.   * I certify that at the point of admission it is my clinical judgment that the patient will require inpatient hospital care spanning beyond 2 midnights from the point of admission due to high intensity of service, high risk for further deterioration and high frequency of surveillance required.Hillary Bow*    GARDNER, JARED M. DO [facility] Pager [phone] Only works nights!  If 7AM-7PM, please contact the primary day team physician taking care of patient  [email or web address] Password [email or web address]  09/30/2018, 5:13 AM

## 2018-09-30 NOTE — ED Notes (Signed)
During verification of blood with Riley Lam, RN; it was noticed that previous blood was administered and hung with 0.45 NS. Instructed primary RN to cease verification and administration process and notify MD. Charge nurse Philippa Chester, RN notified. Patient without distress or discomfort at this time.

## 2018-09-30 NOTE — Plan of Care (Signed)
  Problem: Activity: Goal: Risk for activity intolerance will decrease Outcome: Progressing   Problem: Elimination: Goal: Will not experience complications related to bowel motility Outcome: Progressing   Problem: Safety: Goal: Ability to remain free from injury will improve Outcome: Progressing   

## 2018-09-30 NOTE — Progress Notes (Addendum)
ANTICOAGULATION CONSULT NOTE - Initial Consult  Pharmacy Consult for heparin Indication: atrial fibrillation  Allergies  Allergen Reactions  . Ace Inhibitors Other (See Comments)    angioedema  . Keflex [Cephalexin] Swelling and Other (See Comments)    lips  . Rifadin [Rifampin] Swelling and Other (See Comments)    lips    Patient Measurements: Height: 5\' 2"  (157.5 cm) Weight: 190 lb 3.2 oz (86.3 kg) IBW/kg (Calculated) : 50.1 HEPARIN DW (KG): 69.7  Vital Signs: Temp: 98.4 F (36.9 C) (12/24 1217) Temp Source: Oral (12/24 1217) BP: 150/66 (12/24 1217) Pulse Rate: 78 (12/24 1217)  Labs: Recent Labs    09/29/18 2148 09/30/18 0719  HGB 5.2* 7.2*  HCT 21.0* 25.2*  PLT 530*  --   LABPROT 15.9*  --   INR 1.29  --   CREATININE 0.71  --   TROPONINI <0.03  --     Estimated Creatinine Clearance: 60.1 mL/min (by C-G formula based on SCr of 0.71 mg/dL).   Medical History: Past Medical History:  Diagnosis Date  . Anemia   . Cardiac mass    a. 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 Not seen on most recent TEE  . Cardiomyopathy in other disease   . Cataracts, bilateral   . Diabetes mellitus    Type 2  . Generalized osteoarthritis   . GERD (gastroesophageal reflux disease)   . HLD (hyperlipidemia)   . Hypertension   . Hypothyroidism   . PAF (paroxysmal atrial fibrillation) (HCC)    a. s/p DCCV 06/2017, on Xarelto  . Phlebitis   . S/P TAVR (transcatheter aortic valve replacement) 04/15/2018   Edwards Sapien 3 THV (size 23 mm, model # 9600TFX, serial # [id number]) via the TF approach  . Severe aortic stenosis    a. s/p TAVR  . Stroke [facility]) 2008    Assessment: Janet Williamson is a 77yo female admitted with SOB, outpt labs with PCP resulted in hgb of 5. Patient on xarelto PTA for pAF, last dose 12/22. Pharmacy consulted to dose heparin.  Plan is for EGD Thursday, stopping heparin 6h before. Hgb 7.2 and pltc 530. No overt bleeding but FOBT positive. Will monitor aPTT  and HL until correlation, given recent xarelto dose.  Goal of Therapy:  Heparin level 0.3-0.7 units/ml Monitor platelets by anticoagulation protocol: Yes   Plan:  No bolus Start heparin 850 units/hr now Heparin level and aPTT at 2200 Monitor daily CBC, heparin levels, aPTT, s/sx of bleeding  Thank you for involving pharmacy in this patient's care.  Wendelyn Breslow, PharmD PGY1 Pharmacy Resident Phone: [phone] 09/30/2018 2:13 PM

## 2018-09-30 NOTE — ED Provider Notes (Signed)
[facility] EMERGENCY [facility] Provider Note   CSN: [id number] Arrival date & time: 09/29/18  1749     History   Chief Complaint Chief Complaint  Patient presents with  . Shortness of Breath  . low hgb    HPI Janet Williamson is a 77 y.o. female.  Patient is 77 year old female who presents with shortness of breath and low hemoglobin.  She has a history of diabetes, GERD, hyperlipidemia, hypertension and paroxysmal atrial fibrillation on Xarelto.  She has been having some increased shortness of breath over the last few days.  [facility] and they did blood work which showed a hemoglobin in the 5 range.  She was sent here for further evaluation.  She does not report any blood in her stools or black stools.  No prior history of GI bleeds.  She feels fatigued and short of breath.  No chest pain.  No dizziness.  No other recent illnesses.     Past Medical History:  Diagnosis Date  . Anemia   . Cardiac mass    a. 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 Not seen on most recent TEE  . Cardiomyopathy in other disease   . Cataracts, bilateral   . Diabetes mellitus    Type 2  . Generalized osteoarthritis   . GERD (gastroesophageal reflux disease)   . HLD (hyperlipidemia)   . Hypertension   . Hypothyroidism   . PAF (paroxysmal atrial fibrillation) (HCC)    a. s/p DCCV 06/2017, on Xarelto  . Phlebitis   . S/P TAVR (transcatheter aortic valve replacement) 04/15/2018   Edwards Sapien 3 THV (size 23 mm, model # 9600TFX, serial # [id number]) via the TF approach  . Severe aortic stenosis    a. s/p TAVR  . Stroke [facility](HCC) 2008    Patient Active Problem List   Diagnosis Date Noted  . S/P TAVR (transcatheter aortic valve replacement) 04/15/2018  . Severe aortic stenosis   . PAF (paroxysmal atrial fibrillation) (HCC) 03/04/2018  . History of stroke 11/03/2014  . Hypertension   . DM (diabetes mellitus) (HCC) 05/06/2013  . HTN  (hypertension) 05/06/2013  . Anemia 05/06/2013    Past Surgical History:  Procedure Laterality Date  . ABDOMINAL HYSTERECTOMY    . COLONOSCOPY    . EYE SURGERY Bilateral    cataract removal  . RIGHT/LEFT HEART CATH AND CORONARY ANGIOGRAPHY N/A 03/06/2018   Procedure: RIGHT/LEFT HEART CATH AND CORONARY ANGIOGRAPHY;  Surgeon: Lyn RecordsSmith, Henry W, MD;  Location: [facility] INVASIVE CV LAB;  Service: Cardiovascular;  Laterality: N/A;  . TEE WITHOUT CARDIOVERSION N/A 04/01/2018   Procedure: TRANSESOPHAGEAL ECHOCARDIOGRAM (TEE);  Surgeon: Jake BatheSkains, Mark C, MD;  [facility];  Service: Cardiovascular;  Laterality: N/A;  . TEE WITHOUT CARDIOVERSION N/A 04/15/2018   Procedure: TRANSESOPHAGEAL ECHOCARDIOGRAM (TEE);  Surgeon: Tonny Bollmanooper, Michael, MD;  [facility];  Service: Open Heart Surgery;  Laterality: N/A;  . TOTAL KNEE ARTHROPLASTY Right 04/14/2013   Dr August Saucerean  . TOTAL KNEE ARTHROPLASTY Right 04/14/2013   Procedure: TOTAL KNEE ARTHROPLASTY;  Surgeon: Cammy CopaGregory Scott Dean, MD;  [facility];  Service: Orthopedics;  Laterality: Right;  . TRANSCATHETER AORTIC VALVE REPLACEMENT, TRANSFEMORAL  04/15/2018  . TRANSCATHETER AORTIC VALVE REPLACEMENT, TRANSFEMORAL Bilateral 04/15/2018   Procedure: TRANSCATHETER AORTIC VALVE REPLACEMENT, TRANSFEMORAL;  Surgeon: Tonny Bollmanooper, Michael, MD;  [facility];  Service: Open Heart Surgery;  Laterality: Bilateral;     OB History   No obstetric history on file.  Home Medications    Prior to Admission medications   Medication Sig Start Date End Date Taking? Authorizing Provider  amLODipine (NORVASC) 5 MG tablet Take 5 mg by mouth daily.   Yes [provider]  aspirin EC 81 MG tablet Take 1 tablet (81 mg total) by mouth daily. 04/17/18  Yes Janetta Hora, PA-C  Cholecalciferol (VITAMIN D3) 5000 units CAPS Take 5,000 Units by mouth daily.   Yes [provider]  glucose blood (ACCU-CHEK AVIVA) test strip Use as instructed to check once daily DX:  E11.9 07/02/18  Yes Reather Littler, MD  hydrochlorothiazide (HYDRODIURIL) 25 MG tablet Take 12.5 mg by mouth daily.    Yes [provider]  metFORMIN (GLUCOPHAGE-XR) 750 MG 24 hr tablet TAKE 2 TABLETS BY MOUTH ONCE DAILY WITH BREAKFAST Patient taking differently: Take 1,500 mg by mouth daily with breakfast.  09/08/18  Yes Reather Littler, MD  metoprolol succinate (TOPROL-XL) 50 MG 24 hr tablet Take 50 mg by mouth daily.    Yes [provider]  Polyethyl Glycol-Propyl Glycol (SYSTANE OP) Place 1 drop into both eyes 2 (two) times daily.    Yes [provider]  rivaroxaban (XARELTO) 20 MG TABS tablet Take 1 tablet (20 mg total) daily with supper by mouth. 08/26/17  Yes Skains, Veverly Fells, MD  simvastatin (ZOCOR) 20 MG tablet Take 20 mg by mouth every evening.   Yes [provider]    Family History Family History  Problem Relation Age of Onset  . Stroke Mother   . Hypertension Mother   . Hypertension Father   . Heart attack Neg Hx     Social History Social History   Tobacco Use  . Smoking status: Never Smoker  . Smokeless tobacco: Never Used  Substance Use Topics  . Alcohol use: No  . Drug use: No     Allergies   Ace inhibitors; Keflex [cephalexin]; and Rifadin [rifampin]   Review of Systems Review of Systems  Constitutional: Positive for fatigue. Negative for chills, diaphoresis and fever.  HENT: Negative for congestion, rhinorrhea and sneezing.   Eyes: Negative.   Respiratory: Positive for shortness of breath. Negative for cough and chest tightness.   Cardiovascular: Negative for chest pain and leg swelling.  Gastrointestinal: Negative for abdominal pain, blood in stool, diarrhea, nausea and vomiting.  Genitourinary: Negative for difficulty urinating, flank pain, frequency and hematuria.  Musculoskeletal: Negative for arthralgias and back pain.  Skin: Negative for rash.  Neurological: Negative for dizziness, speech difficulty, weakness, numbness and  headaches.     Physical Exam Updated Vital Signs BP 130/60   Pulse 76   Temp 98.3 F (36.8 C) (Oral)   Resp 20   Ht 5\' 2"  (1.575 m)   Wt 86.6 kg   SpO2 100%   BMI 34.93 kg/m   Physical Exam Constitutional:      Appearance: She is well-developed.  HENT:     Head: Normocephalic and atraumatic.  Eyes:     Pupils: Pupils are equal, round, and reactive to light.  Neck:     Musculoskeletal: Normal range of motion and neck supple.  Cardiovascular:     Rate and Rhythm: Normal rate and regular rhythm.     Heart sounds: Normal heart sounds.  Pulmonary:     Effort: Pulmonary effort is normal. No respiratory distress.     Breath sounds: Normal breath sounds. No wheezing or rales.  Chest:     Chest wall: No tenderness.  Abdominal:  General: Bowel sounds are normal.     Palpations: Abdomen is soft.     Tenderness: There is no abdominal tenderness. There is no guarding or rebound.     Comments: Rectal exam shows brown stool, no melena or gross blood  Musculoskeletal: Normal range of motion.  Lymphadenopathy:     Cervical: No cervical adenopathy.  Skin:    General: Skin is warm and dry.     Findings: No rash.  Neurological:     Mental Status: She is alert and oriented to person, place, and time.      ED Treatments / Results  Labs (all labs ordered are listed, but only abnormal results are displayed) Labs Reviewed  COMPREHENSIVE METABOLIC PANEL - Abnormal; Notable for the following components:      Result Value   Potassium 3.4 (*)    Glucose, Bld 186 (*)    Albumin 3.4 (*)    All other components within normal limits  CBC - Abnormal; Notable for the following components:   WBC 12.5 (*)    RBC 3.28 (*)    Hemoglobin 5.2 (*)    HCT 21.0 (*)    MCV 64.0 (*)    MCH 15.9 (*)    MCHC 24.8 (*)    RDW 25.2 (*)    Platelets 530 (*)    nRBC 0.3 (*)    All other components within normal limits  PROTIME-INR - Abnormal; Notable for the following components:    Prothrombin Time 15.9 (*)    All other components within normal limits  BRAIN NATRIURETIC PEPTIDE - Abnormal; Notable for the following components:   B Natriuretic Peptide 154.7 (*)    All other components within normal limits  POC OCCULT BLOOD, ED - Abnormal; Notable for the following components:   Fecal Occult Bld POSITIVE (*)    All other components within normal limits  LIPASE, BLOOD  URINALYSIS, ROUTINE W REFLEX MICROSCOPIC  TROPONIN I  TYPE AND SCREEN  PREPARE RBC (CROSSMATCH)    EKG EKG Interpretation  Date/Time:  Monday September 29 2018 22:33:25 EST Ventricular Rate:  80 PR Interval:    QRS Duration: 147 QT Interval:  437 QTC Calculation: 505 R Axis:   -46 Text Interpretation:  Sinus rhythm Left bundle branch block since last tracing no significant change Confirmed by Rolan Bucco [phone]) on 09/29/2018 11:31:40 PM   Radiology Dg Chest 2 View  Result Date: 09/30/2018 CLINICAL DATA:  Dyspnea on exertion. History of aortic valve replacement. EXAM: CHEST - 2 VIEW COMPARISON:  Chest radiograph April 15, 2018 FINDINGS: Cardiac silhouette is mildly enlarged. Status post aortic valve replacement. Calcified aortic arch. Pulmonary vascular congestion without pleural effusion or focal consolidation. LEFT mid lung zone scarring. No pneumothorax. Soft tissue planes and included osseous structures are non suspicious. IMPRESSION: 1. Mild cardiomegaly and pulmonary vascular congestion. 2.  Aortic Atherosclerosis (ICD10-I70.0). Electronically Signed   By: Awilda Metro M.D.   On: 09/30/2018 00:22    Procedures Procedures (including critical care time)  Medications Ordered in ED Medications  0.9 %  sodium chloride infusion (Manually program via Guardrails IV Fluids) (has no administration in time range)     Initial Impression / Assessment and Plan / ED Course  I have reviewed the triage vital signs and the nursing notes.  Pertinent labs & imaging results that were available  during my care of the patient were reviewed by me and considered in my medical decision making (see chart for details).  Patient is 77 year old female who presents with a hemoglobin of 5.  She is short of breath but otherwise asymptomatic.  Her vital signs are stable.  Her hemoglobin is markedly low and I discussed transfusion with her.  She is amenable.  She was typed and crossed for 2 units of packed red cells.  She has no abdominal tenderness.  Her rectal exam showed brown stool without gross blood or melena.  It was Hemoccult positive.  I spoke with Dr. Julian Reil with the hospitalist service who will admit the patient for further treatment.  CRITICAL CARE Performed by: Rolan Bucco Total critical care time: 45 minutes Critical care time was exclusive of separately billable procedures and treating other patients. Critical care was necessary to treat or prevent imminent or life-threatening deterioration. Critical care was time spent personally by me on the following activities: development of treatment plan with patient and/or surrogate as well as nursing, discussions with consultants, evaluation of patient's response to treatment, examination of patient, obtaining history from patient or surrogate, ordering and performing treatments and interventions, ordering and review of laboratory studies, ordering and review of radiographic studies, pulse oximetry and re-evaluation of patient's condition.   Final Clinical Impressions(s) / ED Diagnoses   Final diagnoses:  Gastrointestinal hemorrhage, unspecified gastrointestinal hemorrhage type  Iron deficiency anemia, unspecified iron deficiency anemia type    ED Discharge Orders    None       Rolan Bucco, MD 09/30/18 [phone]

## 2018-09-30 NOTE — Progress Notes (Signed)
NURSING PROGRESS NOTE  Janet Williamson [id number] Admission Data: 09/30/2018 7:54 AM Attending Provider: Cathren Harsh, MD [id number], Consuella Lose, MD Code Status: full code  Janet Williamson is a 77 y.o. female patient admitted from ED:  -No acute distress noted.  -No complaints of shortness of breath.  -No complaints of chest pain.   Cardiac Monitoring: Telemetry Box in place. Cardiac monitor yields:normal sinus rhythm.  Blood pressure (!) 145/66, pulse 75, temperature 99.1 F (37.3 C), temperature source Oral, resp. rate 18, height 5\' 2"  (1.575 m), weight 86.3 kg, SpO2 98 %.   IV Fluids:  IV in place, occlusive dsg intact without redness, IV cath antecubital right, condition patent and no redness none.   Allergies:  Ace inhibitors; Keflex [cephalexin]; and Rifadin [rifampin]  Past Medical History:   has a past medical history of Anemia, Cardiac mass, Cardiomyopathy in other disease, Cataracts, bilateral, Diabetes mellitus, Generalized osteoarthritis, GERD (gastroesophageal reflux disease), HLD (hyperlipidemia), Hypertension, Hypothyroidism, PAF (paroxysmal atrial fibrillation) (HCC), Phlebitis, S/P TAVR (transcatheter aortic valve replacement) (04/15/2018), Severe aortic stenosis, and Stroke (HCC) (2008).  Past Surgical History:   has a past surgical history that includes Abdominal hysterectomy; Total knee arthroplasty (Right, 04/14/2013); Total knee arthroplasty (Right, 04/14/2013); RIGHT/LEFT HEART CATH AND CORONARY ANGIOGRAPHY (N/A, 03/06/2018); TEE without cardioversion (N/A, 04/01/2018); Eye surgery (Bilateral); Colonoscopy; Transcatheter aortic valve replacement, transfemoral (04/15/2018); Transcatheter aortic valve replacement, transfemoral (Bilateral, 04/15/2018); and TEE without cardioversion (N/A, 04/15/2018).  Social History:   reports that she has never smoked. She has never used smokeless tobacco. She reports that she does not drink alcohol or use drugs.  Skin: Clean, Dry and  Intact  Patient/Family orientated to room. Information packet given to patient/family. Admission inpatient armband information verified with patient/family to include name and date of birth and placed on patient arm. Side rails up x 2, fall assessment and education completed with patient/family. Patient/family able to verbalize understanding of risk associated with falls and verbalized understanding to call for assistance before getting out of bed. Call light within reach. Patient/family able to voice and demonstrate understanding of unit orientation instructions.    Will continue to evaluate and treat per MD orders.

## 2018-10-01 ENCOUNTER — Other Ambulatory Visit: Payer: Self-pay

## 2018-10-01 LAB — GLUCOSE, CAPILLARY
GLUCOSE-CAPILLARY: 96 mg/dL (ref 70–99)
Glucose-Capillary: 100 mg/dL — ABNORMAL HIGH (ref 70–99)
Glucose-Capillary: 101 mg/dL — ABNORMAL HIGH (ref 70–99)
Glucose-Capillary: 110 mg/dL — ABNORMAL HIGH (ref 70–99)
Glucose-Capillary: 75 mg/dL (ref 70–99)
Glucose-Capillary: 90 mg/dL (ref 70–99)
Glucose-Capillary: 95 mg/dL (ref 70–99)

## 2018-10-01 LAB — BPAM RBC
Blood Product Expiration Date: 202001012359
Blood Product Expiration Date: 202001022359
ISSUE DATE / TIME: 201912240018
ISSUE DATE / TIME: 201912240353
UNIT TYPE AND RH: 5100
Unit Type and Rh: 5100

## 2018-10-01 LAB — TYPE AND SCREEN
ABO/RH(D): O POS
Antibody Screen: POSITIVE
Donor AG Type: NEGATIVE
Donor AG Type: NEGATIVE
UNIT DIVISION: 0
Unit division: 0

## 2018-10-01 LAB — BASIC METABOLIC PANEL
Anion gap: 10 (ref 5–15)
BUN: 6 mg/dL — ABNORMAL LOW (ref 8–23)
CO2: 27 mmol/L (ref 22–32)
Calcium: 9.5 mg/dL (ref 8.9–10.3)
Chloride: 101 mmol/L (ref 98–111)
Creatinine, Ser: 0.74 mg/dL (ref 0.44–1.00)
GFR calc Af Amer: >60 mL/min (ref >60)
GFR calc non Af Amer: >60 mL/min (ref >60)
Glucose, Bld: 106 mg/dL — ABNORMAL HIGH (ref 70–99)
Potassium: 3.5 mmol/L (ref 3.5–5.1)
Sodium: 138 mmol/L (ref 135–145)

## 2018-10-01 LAB — HEPARIN LEVEL (UNFRACTIONATED)
HEPARIN UNFRACTIONATED: 0.28 [IU]/mL — AB (ref 0.30–0.70)
Heparin Unfractionated: 0.35 IU/mL (ref 0.30–0.70)

## 2018-10-01 MED ORDER — SODIUM CHLORIDE 0.9 % IV SOLN
INTRAVENOUS | Status: DC
  Administered 2018-10-02: 01:00:00 via INTRAVENOUS

## 2018-10-01 MED ORDER — HEPARIN (PORCINE) 25000 UT/250ML-% IV SOLN
1150.0000 [IU]/h | INTRAVENOUS | Status: DC
  Administered 2018-10-01: 1150 [IU]/h via INTRAVENOUS
  Filled 2018-10-01 (×2): qty 250

## 2018-10-01 MED ORDER — BISACODYL 5 MG PO TBEC
10.0000 mg | DELAYED_RELEASE_TABLET | Freq: Once | ORAL | Status: AC
  Administered 2018-10-01: 10 mg via ORAL
  Filled 2018-10-01 (×2): qty 2

## 2018-10-01 NOTE — Progress Notes (Signed)
ANTICOAGULATION CONSULT NOTE - Follow Up Consult  Pharmacy Consult for heparin Indication: atrial fibrillation  Allergies  Allergen Reactions  . Ace Inhibitors Other (See Comments)    angioedema  . Keflex [Cephalexin] Swelling and Other (See Comments)    lips  . Rifadin [Rifampin] Swelling and Other (See Comments)    lips    Patient Measurements: Height: 5\' 2"  (157.5 cm) Weight: 185 lb 12.8 oz (84.3 kg) IBW/kg (Calculated) : 50.1 Heparin Dosing Weight: 69.7  Vital Signs: Temp: 98.5 F (36.9 C) (12/25 1149) Temp Source: Oral (12/25 1149) BP: 139/62 (12/25 1149) Pulse Rate: 66 (12/25 1149)  Labs: Recent Labs    09/29/18 2148 09/30/18 0719 09/30/18 1608 09/30/18 2206 10/01/18 0617 10/01/18 1419  HGB 5.2* 7.2* 8.5*  --  8.3*  --   HCT 21.0* 25.2* 28.6*  --  27.7*  --   PLT 530*  --   --   --  447*  --   APTT  --   --   --  38*  --   --   LABPROT 15.9*  --   --   --   --   --   INR 1.29  --   --   --   --   --   HEPARINUNFRC  --   --   --  0.24* 0.35 0.28*  CREATININE 0.71  --   --   --  0.74  --   TROPONINI <0.03  --   --   --   --   --     Estimated Creatinine Clearance: 59.3 mL/min (by C-G formula based on SCr of 0.74 mg/dL).   Medications:  Scheduled:  . amLODipine  5 mg Oral Daily  . hydrochlorothiazide  12.5 mg Oral Daily  . insulin aspart  0-9 Units Subcutaneous Q4H  . metoprolol succinate  50 mg Oral Daily  . pantoprazole (PROTONIX) IV  40 mg Intravenous Q12H  . simvastatin  20 mg Oral QPM    Assessment: Janet Williamson is a 77 year old female admitted with SOB, outpatient labs with PCP resulted in Hgb of 5. Patient was on xarelto PTA for pAF, last dose was 12/22. Pharmacy consulted to dose heparin.    Plan is for diagnostic EGD and colonoscopy Thursday with Dr.Outlaw. Stopping heparin at midnight per GI note by Dr. Marca Ancona.  Heparin level was therapeutic at 0.35 this am. Confirmatory heparin level this afternoon slightly subtherapeutic at 1400 at  0.28, increased infusion slightly to 1150 units / hr.    Goal of Therapy:  Heparin level 0.3-0.7 units/ml Monitor platelets by anticoagulation protocol: Yes   Plan:  Increase Heparin to 1150 units/hr and stop infusion at midnight (0000) on 12/26 Continue to monitor H&H and platelets  Thank you for allowing pharmacy to be a part of this patient's care.  Bradley Ferris, PharmD 10/01/2018 2:59 PM PGY-1 Pharmacy Resident Direct Phone: [phone] Please check [email or web address] for unit-specific pharmacist phone numbers

## 2018-10-01 NOTE — Progress Notes (Signed)
Subjective: The patient was seen and examined at bedside. She is on clear liquid diet and has not had a bowel movement since admission. She has been started on heparin drip for history of atrial fibrillation and stroke, last does of Xarelto was on Sunday. She reports improvement in her breathing after 3 units of transfusion.  Objective: Vital signs in last 24 hours: Temp:  [98.4 F (36.9 C)-99 F (37.2 C)] 98.4 F (36.9 C) (12/25 0756) Pulse Rate:  [66-81] 66 (12/25 0756) Resp:  [14-18] 14 (12/25 0756) BP: (124-155)/(46-70) 133/58 (12/25 0756) SpO2:  [96 %-100 %] 97 % (12/25 0756) Weight:  [84.3 kg] 84.3 kg (12/25 0448) Weight change: -2.359 kg Last BM Date: 09/28/18  ZO:XWRU pallor GENERAL:not in distress ABDOMEN:nondistended EXTREMITIES:no deformity  Lab Results: Results for orders placed or performed during the hospital encounter of 09/29/18 (from the past 48 hour(s))  Lipase, blood     Status: None   Collection Time: 09/29/18  9:48 PM  Result Value Ref Range   Lipase 23 11 - 51 U/L    Comment: Performed at [facility], [address] [place], [place] [address]  Comprehensive metabolic panel     Status: Abnormal   Collection Time: 09/29/18  9:48 PM  Result Value Ref Range   Sodium 137 135 - 145 mmol/L   Potassium 3.4 (L) 3.5 - 5.1 mmol/L   Chloride 100 98 - 111 mmol/L   CO2 26 22 - 32 mmol/L   Glucose, Bld 186 (H) 70 - 99 mg/dL   BUN 10 8 - 23 mg/dL   Creatinine, Ser 9.81 0.44 - 1.00 mg/dL   Calcium 9.6 8.9 - 19.1 mg/dL   Total Protein 8.0 6.5 - 8.1 g/dL   Albumin 3.4 (L) 3.5 - 5.0 g/dL   AST 18 15 - 41 U/L   ALT 11 0 - 44 U/L   Alkaline Phosphatase 54 38 - 126 U/L   Total Bilirubin 0.3 0.3 - 1.2 mg/dL   GFR calc non Af Amer >60 >60 mL/min   GFR calc Af Amer >60 >60 mL/min   Anion gap 11 5 - 15    Comment: Performed at [facility], [address], [place], [place] [address]  CBC     Status: Abnormal   Collection Time: 09/29/18  9:48 PM  Result  Value Ref Range   WBC 12.5 (H) 4.0 - 10.5 K/uL   RBC 3.28 (L) 3.87 - 5.11 MIL/uL   Hemoglobin 5.2 (LL) 12.0 - 15.0 g/dL    Comment: REPEATED TO VERIFY Reticulocyte Hemoglobin testing may be clinically indicated, consider ordering this additional test [id number] THIS CRITICAL RESULT HAS VERIFIED AND BEEN CALLED TO A. MCMILLAN RN BY HANNAH MILES ON 12 23 2019 AT 2218, AND HAS BEEN READ BACK.     HCT 21.0 (L) 36.0 - 46.0 %   MCV 64.0 (L) 80.0 - 100.0 fL   MCH 15.9 (L) 26.0 - 34.0 pg   MCHC 24.8 (L) 30.0 - 36.0 g/dL   RDW 57.8 (H) 46.9 - 62.9 %   Platelets 530 (H) 150 - 400 K/uL   nRBC 0.3 (H) 0.0 - 0.2 %    Comment: Performed at [facility], [address] [place], [place] [address]  Protime-INR     Status: Abnormal   Collection Time: 09/29/18  9:48 PM  Result Value Ref Range   Prothrombin Time 15.9 (H) 11.4 - 15.2 seconds   INR 1.29  Comment: Performed at [facility], [address], [place], [place] [address]  Type and screen [facility]     Status: None   Collection Time: 09/29/18  9:48 PM  Result Value Ref Range   ABO/RH(D) O POS    Antibody Screen POS    Sample Expiration 09/29/2018    Antibody Identification ANTI K    Unit Number [id number]    Blood Component Type RED CELLS,LR    Unit division 00    Status of Unit ISSUED,FINAL    Donor AG Type NEGATIVE FOR KELL ANTIGEN    Transfusion Status OK TO TRANSFUSE    Crossmatch Result COMPATIBLE    Unit Number [id number]    Blood Component Type RED CELLS,LR    Unit division 00    Status of Unit ISSUED,FINAL    Donor AG Type NEGATIVE FOR KELL ANTIGEN    Transfusion Status OK TO TRANSFUSE    Crossmatch Result COMPATIBLE   Brain natriuretic peptide     Status: Abnormal   Collection Time: 09/29/18  9:48 PM  Result Value Ref Range   B Natriuretic Peptide 154.7 (H) 0.0 - 100.0 pg/mL    Comment: Performed at [facility], [address], [place], [place] [address]  Troponin I - ONCE  - STAT     Status: None   Collection Time: 09/29/18  9:48 PM  Result Value Ref Range   Troponin I <0.03 <0.03 ng/mL    Comment: Performed at [facility], [address], [place], [place] [address]  Prepare RBC     Status: None   Collection Time: 09/29/18 11:29 PM  Result Value Ref Range   Order Confirmation      ORDER PROCESSED BY BLOOD BANK Performed at [facility], [address], [place], [place] [address]   Urinalysis, Routine w reflex microscopic     Status: None   Collection Time: 09/30/18 12:13 AM  Result Value Ref Range   Color, Urine YELLOW YELLOW   APPearance CLEAR CLEAR   Specific Gravity, Urine 1.013 1.005 - 1.030   pH 7.0 5.0 - 8.0   Glucose, UA NEGATIVE NEGATIVE mg/dL   Hgb urine dipstick NEGATIVE NEGATIVE   Bilirubin Urine NEGATIVE NEGATIVE   Ketones, ur NEGATIVE NEGATIVE mg/dL   Protein, ur NEGATIVE NEGATIVE mg/dL   Nitrite NEGATIVE NEGATIVE   Leukocytes, UA NEGATIVE NEGATIVE    Comment: Performed at [facility], [address], [place], [place] [address]  POC occult blood, ED     Status: Abnormal   Collection Time: 09/30/18 12:19 AM  Result Value Ref Range   Fecal Occult Bld POSITIVE (A) NEGATIVE  CBG monitoring, ED     Status: Abnormal   Collection Time: 09/30/18  5:24 AM  Result Value Ref Range   Glucose-Capillary 106 (H) 70 - 99 mg/dL  Hemoglobin and hematocrit, blood     Status: Abnormal   Collection Time: 09/30/18  7:19 AM  Result Value Ref Range   Hemoglobin 7.2 (L) 12.0 - 15.0 g/dL    Comment: REPEATED TO VERIFY POST TRANSFUSION SPECIMEN    HCT 25.2 (L) 36.0 - 46.0 %    Comment: Performed at [facility], [address], [place], [place] [address]  Vitamin B12     Status: Abnormal   Collection Time: 09/30/18  9:22 AM  Result Value Ref Range   Vitamin B-12 114 (L) 180 - 914 pg/mL  Comment: (NOTE) This assay is not validated for testing neonatal or myeloproliferative syndrome specimens for Vitamin B12  levels. Performed at [facility], [address] [place], [place] [address]   Folate     Status: None   Collection Time: 09/30/18  9:22 AM  Result Value Ref Range   Folate 25.7 >5.9 ng/mL    Comment: Performed at [facility], [address] [place], [place] [address]  Iron and TIBC     Status: Abnormal   Collection Time: 09/30/18  9:22 AM  Result Value Ref Range   Iron 133 28 - 170 ug/dL   TIBC 562463 (H) 130250 - 865450 ug/dL   Saturation Ratios 29 10.4 - 31.8 %   UIBC 330 ug/dL    Comment: Performed at [facility], [address] [place], [place] [address]  Ferritin     Status: Abnormal   Collection Time: 09/30/18  9:22 AM  Result Value Ref Range   Ferritin 9 (L) 11 - 307 ng/mL    Comment: Performed at [facility], [address] [place], [place] [address]  Reticulocytes     Status: None   Collection Time: 09/30/18  9:22 AM  Result Value Ref Range   Retic Ct Pct 0.6 0.4 - 3.1 %   RBC. 3.91 3.87 - 5.11 MIL/uL   Retic Count, Absolute 23.0 19.0 - 186.0 K/uL   Immature Retic Fract 12.9 2.3 - 15.9 %    Comment: Performed at [facility], [address] [place], [place] [address]  Glucose, capillary     Status: Abnormal   Collection Time: 09/30/18  9:29 AM  Result Value Ref Range   Glucose-Capillary 171 (H) 70 - 99 mg/dL   Comment 1 Notify RN   Type and screen     Status: None (Preliminary result)   Collection Time: 09/30/18  9:33 AM  Result Value Ref Range   ABO/RH(D) O POS    Antibody Screen POS    Sample Expiration 10/03/2018    Antibody Identification ANTI K    Unit Number [id number]    Blood Component Type RBC LR PHER2    Unit division 00    Status of Unit ISSUED,FINAL    Donor AG Type NEGATIVE FOR KELL ANTIGEN    Transfusion Status OK TO TRANSFUSE    Crossmatch Result COMPATIBLE    Unit Number [id number]    Blood Component Type RED CELLS,LR    Unit division 00    Status of Unit ALLOCATED    Transfusion Status OK TO TRANSFUSE     Crossmatch Result COMPATIBLE   Prepare RBC     Status: None   Collection Time: 09/30/18  9:33 AM  Result Value Ref Range   Order Confirmation      ORDER PROCESSED BY BLOOD BANK Performed at [facility], [address] [place], [place] [address]   Glucose, capillary     Status: Abnormal   Collection Time: 09/30/18  1:31 PM  Result Value Ref Range   Glucose-Capillary 158 (H) 70 - 99 mg/dL   Comment 1 Notify RN   Hemoglobin and hematocrit, blood     Status: Abnormal   Collection Time: 09/30/18  4:08 PM  Result Value Ref Range   Hemoglobin 8.5 (L) 12.0 - 15.0 g/dL   HCT 32.928.6 (L) 51.836.0 - 84.146.0 %    Comment: Performed at [facility], [address] [place], [place] [address]  Glucose, capillary     Status: None   Collection Time: 09/30/18  5:23 PM  Result Value Ref Range   Glucose-Capillary 89 70 - 99 mg/dL   Comment 1 Notify RN   Glucose, capillary     Status: None   Collection Time: 09/30/18  9:19 PM  Result Value Ref Range   Glucose-Capillary 87 70 - 99 mg/dL  Heparin level (unfractionated)     Status: Abnormal   Collection Time: 09/30/18 10:06 PM  Result Value Ref Range   Heparin Unfractionated 0.24 (L) 0.30 - 0.70 IU/mL    Comment: (NOTE) If heparin results are below expected values, and patient dosage has  been confirmed, suggest follow up testing of antithrombin III levels. Performed at [facility], [address], [place], [place] [address]   APTT     Status: Abnormal   Collection Time: 09/30/18 10:06 PM  Result Value Ref Range   aPTT 38 (H) 24 - 36 seconds    Comment:        IF BASELINE aPTT IS ELEVATED, SUGGEST PATIENT RISK ASSESSMENT BE USED TO DETERMINE APPROPRIATE ANTICOAGULANT THERAPY. Performed at [facility], [address], [place], [place] [address]   Glucose, capillary     Status: None   Collection Time: 10/01/18 12:21 AM  Result Value Ref Range   Glucose-Capillary 96 70 - 99 mg/dL  Glucose, capillary     Status:  Abnormal   Collection Time: 10/01/18  4:53 AM  Result Value Ref Range   Glucose-Capillary 100 (H) 70 - 99 mg/dL  CBC     Status: Abnormal   Collection Time: 10/01/18  6:17 AM  Result Value Ref Range   WBC 10.2 4.0 - 10.5 K/uL   RBC 4.01 3.87 - 5.11 MIL/uL   Hemoglobin 8.3 (L) 12.0 - 15.0 g/dL    Comment: Reticulocyte Hemoglobin testing may be clinically indicated, consider ordering this additional test [id number]    HCT 27.7 (L) 36.0 - 46.0 %   MCV 69.1 (L) 80.0 - 100.0 fL    Comment: REPEATED TO VERIFY   MCH 20.7 (L) 26.0 - 34.0 pg   MCHC 30.0 30.0 - 36.0 g/dL   RDW 46.9 (H) 62.9 - 52.8 %   Platelets 447 (H) 150 - 400 K/uL   nRBC 0.4 (H) 0.0 - 0.2 %    Comment: Performed at [facility], [address] [place], [place] [address]  Basic metabolic panel     Status: Abnormal   Collection Time: 10/01/18  6:17 AM  Result Value Ref Range   Sodium 138 135 - 145 mmol/L   Potassium 3.5 3.5 - 5.1 mmol/L   Chloride 101 98 - 111 mmol/L   CO2 27 22 - 32 mmol/L   Glucose, Bld 106 (H) 70 - 99 mg/dL   BUN 6 (L) 8 - 23 mg/dL   Creatinine, Ser 4.01 0.44 - 1.00 mg/dL   Calcium 9.5 8.9 - 02.7 mg/dL   GFR calc non Af Amer >60 >60 mL/min   GFR calc Af Amer >60 >60 mL/min   Anion gap 10 5 - 15    Comment: Performed at [facility], [address], [place], [place] [address]  Heparin level (unfractionated)     Status: None   Collection Time: 10/01/18  6:17 AM  Result Value Ref Range   Heparin Unfractionated 0.35 0.30 - 0.70 IU/mL    Comment: (NOTE) If heparin results are below expected values, and  patient dosage has  been confirmed, suggest follow up testing of antithrombin III levels. Performed at [facility], [address], [place], [place] [address]   Glucose, capillary     Status: Abnormal   Collection Time: 10/01/18  7:54 AM  Result Value Ref Range   Glucose-Capillary 101 (H) 70 - 99 mg/dL   Comment 1 Notify RN     Studies/Results: Dg Chest 2 View  Result  Date: 09/30/2018 CLINICAL DATA:  Dyspnea on exertion. History of aortic valve replacement. EXAM: CHEST - 2 VIEW COMPARISON:  Chest radiograph April 15, 2018 FINDINGS: Cardiac silhouette is mildly enlarged. Status post aortic valve replacement. Calcified aortic arch. Pulmonary vascular congestion without pleural effusion or focal consolidation. LEFT mid lung zone scarring. No pneumothorax. Soft tissue planes and included osseous structures are non suspicious. IMPRESSION: 1. Mild cardiomegaly and pulmonary vascular congestion. 2.  Aortic Atherosclerosis (ICD10-I70.0). Electronically Signed   By: Awilda Metro M.D.   On: 09/30/2018 00:22    Medications: I have reviewed the patient's current medications.  Assessment: Severe microcytic anemia of unknown etiology, FOBT positive, was on Xarelto until Sunday. 3 units PRBC transfusion given,hemoglobin has improved from 5.2-8.3  Plan: Continue clear liquid diet for today, Dulcolax now and colonic prep to be started at noon-prep to be split with one fourth of the gallon for tomorrow morning to be finished before 6 AM. Heparin to be discontinued at midnight. Plan for diagnostic EGD and colonoscopy in a.m. with Dr. Dulce Sellar, last colonoscopy from 2009 was unremarkable.   Kerin Salen 10/01/2018, 9:08 AM   Pager [phone] If no answer or after 5 PM call [phone]

## 2018-10-01 NOTE — Progress Notes (Signed)
[facility]                                                                              Patient Demographics  Janet Williamson, is a 77 y.o. female, DOB - 1941/03/11, WUJ:[id number]  Admit date - 09/29/2018   Admitting Physician Hillary Bow, DO  Outpatient Primary MD for the patient is Maurice Small, MD  Outpatient specialists:   LOS - 1  days   Medical records reviewed and are as summarized below:    Chief Complaint  Patient presents with  . Shortness of Breath  . 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  Patient went to see her PCP, hemoglobin was 5, baseline 8-9.  Patient was sent to ED for further evaluation. FOBT positive.  Per patient no prior history of GIB.   Assessment & Plan    Acute on chronic symptomatic anemia in the setting of anticoagulation with Xarelto, GI bleed -Baseline hemoglobin 8-9, presented with hemoglobin of 5.2, status post 3 units of packed RBC transfusion  -Hemoglobin 8.3.  Cardiology following, continue PPI Patient was started on IV heparin drip due to history of paroxysmal A. fib, TAVR, prior history of embolic stroke -N.p.o. after midnight and hold heparin drip at midnight for endoscopy tomorrow -Anemia panel with ferritin 9, percent saturation ratio 29  Active Problems:   DM (diabetes mellitus), type II, non-insulin-dependent, with neurological complications, history of prior stroke (HCC) -Continue to hold metformin, continue sliding scale insulin.  -Hemoglobin A1c 6.9 in 04/2018, follow hemoglobin A1c    HTN (hypertension) -Continue Toprol-XL, HCTZ, amlodipine  History of prior embolic stroke in 2008 -Continue statin, hold aspirin and Xarelto due to #1, currently on IV heparin drip    PAF  (paroxysmal atrial fibrillation) (HCC) -Currently rate controlled, hold Xarelto due to GI bleed Currently on IV heparin drip    S/P TAVR (transcatheter aortic valve replacement) in 04/2018 -Currently on aspirin 81 mg daily and Xarelto 20 mg daily, reviewed cardiology office notes, by Dr. Anne Fu 08/2018, patient was to stop aspirin in January 2020 -For now continue IV heparin drip, will discuss with cardiology regarding anticoagulation at discharge.  Hyperlipidemia Continue statin  Code Status: Full code DVT Prophylaxis: IV heparin drip Family Communication: Discussed in detail with the patient, all imaging results, lab results explained to the patient, currently alert and oriented x4   Disposition Plan:  will plan disposition after diagnostic endoscopy and colonoscopy done  Time Spent in minutes   25-minutes  Procedures:  None  Consultants:   Gastroenterology  Antimicrobials:      Medications  Scheduled Meds: . amLODipine  5 mg Oral Daily  . hydrochlorothiazide  12.5 mg Oral Daily  . insulin aspart  0-9 Units Subcutaneous Q4H  . metoprolol succinate  50 mg Oral Daily  . pantoprazole (PROTONIX) IV  40 mg Intravenous Q12H  . polyethylene glycol-electrolytes  4,000 mL Oral Once  .  simvastatin  20 mg Oral QPM   Continuous Infusions: . heparin 1,000 Units/hr (10/01/18 0919)   PRN Meds:.acetaminophen **OR** acetaminophen, ondansetron **OR** ondansetron (ZOFRAN) IV   Antibiotics   Anti-infectives (From admission, onward)   None        Subjective:   Janet Williamson was seen and examined today.  No complaints, no active bleeding.  No chest pain, shortness of breath or dizziness.  Feels a lot better after blood transfusion. Patient denies abdominal pain, N/V/D/C, new weakness, numbess, tingling. No acute events overnight.    Objective:   Vitals:   10/01/18 0021 10/01/18 0448 10/01/18 0756 10/01/18 1149  BP: (!) 149/66 (!) 124/46 (!) 133/58 139/62  Pulse: 68 66  66 66  Resp: 18 18 14 18   Temp: 98.5 F (36.9 C) 99 F (37.2 C) 98.4 F (36.9 C) 98.5 F (36.9 C)  TempSrc: Oral Oral Oral Oral  SpO2: 98% 97% 97% 97%  Weight:  84.3 kg    Height:        Intake/Output Summary (Last 24 hours) at 10/01/2018 1155 Last data filed at 10/01/2018 1109 Gross per 24 hour  Intake 578.5 ml  Output 200 ml  Net 378.5 ml     Wt Readings from Last 3 Encounters:  10/01/18 84.3 kg  08/25/18 85.6 kg  05/21/18 86.2 kg    Physical Exam  General: Alert and oriented x 3, NAD  Eyes:   HEENT:   Cardiovascular: S1 S2 auscultated, RRR No pedal edema b/l  Respiratory: CTA B  Gastrointestinal: Soft, nontender, nondistended, + bowel sounds  Ext: no pedal edema bilaterally  Neuro: No new deficits  Musculoskeletal: No digital cyanosis, clubbing  Skin: No rashes  Psych: Normal affect and demeanor, alert and oriented x3   Data Reviewed:  I have personally reviewed following labs and imaging studies  Micro Results No results found for this or any previous visit (from the past 240 hour(s)).  Radiology Reports Dg Chest 2 View  Result Date: 09/30/2018 CLINICAL DATA:  Dyspnea on exertion. History of aortic valve replacement. EXAM: CHEST - 2 VIEW COMPARISON:  Chest radiograph April 15, 2018 FINDINGS: Cardiac silhouette is mildly enlarged. Status post aortic valve replacement. Calcified aortic arch. Pulmonary vascular congestion without pleural effusion or focal consolidation. LEFT mid lung zone scarring. No pneumothorax. Soft tissue planes and included osseous structures are non suspicious. IMPRESSION: 1. Mild cardiomegaly and pulmonary vascular congestion. 2.  Aortic Atherosclerosis (ICD10-I70.0). Electronically Signed   By: Awilda Metroourtnay  Bloomer M.D.   On: 09/30/2018 00:22    Lab Data:  CBC: Recent Labs  Lab 09/29/18 2148 09/30/18 0719 09/30/18 1608 10/01/18 0617  WBC 12.5*  --   --  10.2  HGB 5.2* 7.2* 8.5* 8.3*  HCT 21.0* 25.2* 28.6* 27.7*  MCV  64.0*  --   --  69.1*  PLT 530*  --   --  447*   Basic Metabolic Panel: Recent Labs  Lab 09/29/18 2148 10/01/18 0617  NA 137 138  K 3.4* 3.5  CL 100 101  CO2 26 27  GLUCOSE 186* 106*  BUN 10 6*  CREATININE 0.71 0.74  CALCIUM 9.6 9.5   GFR: Estimated Creatinine Clearance: 59.3 mL/min (by C-G formula based on SCr of 0.74 mg/dL). Liver Function Tests: Recent Labs  Lab 09/29/18 2148  AST 18  ALT 11  ALKPHOS 54  BILITOT 0.3  PROT 8.0  ALBUMIN 3.4*   Recent Labs  Lab 09/29/18 2148  LIPASE 23   No results for  input(s): AMMONIA in the last 168 hours. Coagulation Profile: Recent Labs  Lab 09/29/18 2148  INR 1.29   Cardiac Enzymes: Recent Labs  Lab 09/29/18 2148  TROPONINI <0.03   BNP (last 3 results) No results for input(s): PROBNP in the last 8760 hours. HbA1C: No results for input(s): HGBA1C in the last 72 hours. CBG: Recent Labs  Lab 09/30/18 2119 10/01/18 0021 10/01/18 0453 10/01/18 0754 10/01/18 1147  GLUCAP 87 96 100* 101* 95   Lipid Profile: No results for input(s): CHOL, HDL, LDLCALC, TRIG, CHOLHDL, LDLDIRECT in the last 72 hours. Thyroid Function Tests: No results for input(s): TSH, T4TOTAL, FREET4, T3FREE, THYROIDAB in the last 72 hours. Anemia Panel: Recent Labs    09/30/18 0922  VITAMINB12 114*  FOLATE 25.7  FERRITIN 9*  TIBC 463*  IRON 133  RETICCTPCT 0.6   Urine analysis:    Component Value Date/Time   COLORURINE YELLOW 09/30/2018 0013   APPEARANCEUR CLEAR 09/30/2018 0013   LABSPEC 1.013 09/30/2018 0013   PHURINE 7.0 09/30/2018 0013   GLUCOSEU NEGATIVE 09/30/2018 0013   GLUCOSEU NEGATIVE 12/30/2014 0758   HGBUR NEGATIVE 09/30/2018 0013   BILIRUBINUR NEGATIVE 09/30/2018 0013   KETONESUR NEGATIVE 09/30/2018 0013   PROTEINUR NEGATIVE 09/30/2018 0013   UROBILINOGEN 0.2 12/30/2014 0758   NITRITE NEGATIVE 09/30/2018 0013   LEUKOCYTESUR NEGATIVE 09/30/2018 0013     Romeka Scifres M.D. [facility] 10/01/2018, 11:55  AM  Pager: [phone] Between 7am to 7pm - call Pager - [phone]  After 7pm go to [email or web address] - password [email or web address]  Call night coverage person covering after 7pm

## 2018-10-02 ENCOUNTER — Encounter (HOSPITAL_COMMUNITY): Payer: Self-pay | Admitting: *Deleted

## 2018-10-02 ENCOUNTER — Inpatient Hospital Stay (HOSPITAL_COMMUNITY): Payer: Medicare Other | Admitting: Certified Registered Nurse Anesthetist

## 2018-10-02 ENCOUNTER — Encounter (HOSPITAL_COMMUNITY)
Admission: EM | Disposition: A | Discharge status: Home / Self Care | Payer: Self-pay | Source: Home / Self Care | Attending: Internal Medicine

## 2018-10-02 HISTORY — PX: ESOPHAGOGASTRODUODENOSCOPY (EGD) WITH PROPOFOL: SHX5813

## 2018-10-02 HISTORY — PX: COLONOSCOPY WITH PROPOFOL: SHX5780

## 2018-10-02 HISTORY — PX: GIVENS CAPSULE STUDY: SHX5432

## 2018-10-02 LAB — CBC
HEMATOCRIT: 27.7 % — AB (ref 36.0–46.0)
HEMATOCRIT: 30.1 % — AB (ref 36.0–46.0)
Hemoglobin: 8.3 g/dL — ABNORMAL LOW (ref 12.0–15.0)
Hemoglobin: 8.7 g/dL — ABNORMAL LOW (ref 12.0–15.0)
MCH: 20.7 pg — ABNORMAL LOW (ref 26.0–34.0)
MCH: 20.1 pg — ABNORMAL LOW (ref 26.0–34.0)
MCHC: 30 g/dL (ref 30.0–36.0)
MCHC: 28.9 g/dL — ABNORMAL LOW (ref 30.0–36.0)
MCV: 69.1 fL — ABNORMAL LOW (ref 80.0–100.0)
MCV: 69.5 fL — ABNORMAL LOW (ref 80.0–100.0)
Platelets: 447 10*3/uL — ABNORMAL HIGH (ref 150–400)
Platelets: 466 10*3/uL — ABNORMAL HIGH (ref 150–400)
RBC: 4.01 MIL/uL (ref 3.87–5.11)
RBC: 4.33 MIL/uL (ref 3.87–5.11)
RDW: 26.7 % — ABNORMAL HIGH (ref 11.5–15.5)
RDW: 27.9 % — ABNORMAL HIGH (ref 11.5–15.5)
WBC: 10.2 10*3/uL (ref 4.0–10.5)
WBC: 11.3 10*3/uL — ABNORMAL HIGH (ref 4.0–10.5)
nRBC: 0.4 % — ABNORMAL HIGH (ref 0.0–0.2)
nRBC: 0 % (ref 0.0–0.2)

## 2018-10-02 LAB — GLUCOSE, CAPILLARY
GLUCOSE-CAPILLARY: 86 mg/dL (ref 70–99)
Glucose-Capillary: 96 mg/dL (ref 70–99)
Glucose-Capillary: 91 mg/dL (ref 70–99)
Glucose-Capillary: 109 mg/dL — ABNORMAL HIGH (ref 70–99)
Glucose-Capillary: 82 mg/dL (ref 70–99)

## 2018-10-02 LAB — BASIC METABOLIC PANEL
Anion gap: 9 (ref 5–15)
BUN: 5 mg/dL — ABNORMAL LOW (ref 8–23)
CO2: 26 mmol/L (ref 22–32)
CREATININE: 0.78 mg/dL (ref 0.44–1.00)
Calcium: 9.5 mg/dL (ref 8.9–10.3)
Chloride: 102 mmol/L (ref 98–111)
GFR calc Af Amer: >60 mL/min (ref >60)
GFR calc non Af Amer: >60 mL/min (ref >60)
GLUCOSE: 96 mg/dL (ref 70–99)
Potassium: 3.2 mmol/L — ABNORMAL LOW (ref 3.5–5.1)
Sodium: 137 mmol/L (ref 135–145)

## 2018-10-02 LAB — HEPARIN LEVEL (UNFRACTIONATED): Heparin Unfractionated: 0.11 IU/mL — ABNORMAL LOW (ref 0.30–0.70)

## 2018-10-02 LAB — HEMOGLOBIN A1C
Hgb A1c MFr Bld: 5.4 % (ref 4.8–5.6)
Mean Plasma Glucose: 108 mg/dL

## 2018-10-02 SURGERY — ESOPHAGOGASTRODUODENOSCOPY (EGD) WITH PROPOFOL
Anesthesia: Monitor Anesthesia Care

## 2018-10-02 MED ORDER — HEPARIN (PORCINE) 25000 UT/250ML-% IV SOLN
1150.0000 [IU]/h | INTRAVENOUS | Status: DC
  Administered 2018-10-02 – 2018-10-03 (×2): 1150 [IU]/h via INTRAVENOUS
  Filled 2018-10-02 (×2): qty 250

## 2018-10-02 MED ORDER — LACTATED RINGERS IV SOLN: INTRAVENOUS | Status: DC | PRN

## 2018-10-02 MED ORDER — STERILE WATER FOR IRRIGATION IR SOLN
Status: DC | PRN
  Administered 2018-10-02: 60 mL

## 2018-10-02 MED ORDER — PROPOFOL 10 MG/ML IV BOLUS
INTRAVENOUS | Status: DC | PRN
  Administered 2018-10-02 (×3): 20 mg via INTRAVENOUS

## 2018-10-02 MED ORDER — PROPOFOL 500 MG/50ML IV EMUL
INTRAVENOUS | Status: DC | PRN
  Administered 2018-10-02: 100 ug/kg/min via INTRAVENOUS

## 2018-10-02 MED ORDER — LIDOCAINE 20MG/ML (2%) 15 ML SYRINGE OPTIME
INTRAMUSCULAR | Status: DC | PRN
  Administered 2018-10-02: 40 mg via INTRAVENOUS

## 2018-10-02 SURGICAL SUPPLY — 25 items

## 2018-10-02 NOTE — Anesthesia Preprocedure Evaluation (Signed)
Anesthesia Evaluation  Patient identified by MRN, date of birth, ID band Patient awake    Reviewed: Allergy & Precautions, NPO status , Patient's Chart, lab work & pertinent test results  Airway Mallampati: II  TM Distance: >3 FB Neck ROM: Full    Dental no notable dental hx.    Pulmonary neg pulmonary ROS,    Pulmonary exam normal breath sounds clear to auscultation       Cardiovascular hypertension, Normal cardiovascular exam+ dysrhythmias Atrial Fibrillation  Rhythm:Regular Rate:Normal  S/p TAVR   Neuro/Psych CVA (2008) negative psych ROS   GI/Hepatic negative GI ROS, Neg liver ROS,   Endo/Other  diabetes  Renal/GU negative Renal ROS  negative genitourinary   Musculoskeletal negative musculoskeletal ROS (+)   Abdominal   Peds negative pediatric ROS (+)  Hematology negative hematology ROS (+)   Anesthesia Other Findings   Reproductive/Obstetrics negative OB ROS                             Anesthesia Physical Anesthesia Plan  ASA: III  Anesthesia Plan: MAC   Post-op Pain Management:    Induction:   PONV Risk Score and Plan: 2 and Treatment may vary due to age or medical condition  Airway Management Planned: Simple Face Mask  Additional Equipment:   Intra-op Plan:   Post-operative Plan:   Informed Consent: I have reviewed the patients History and Physical, chart, labs and discussed the procedure including the risks, benefits and alternatives for the proposed anesthesia with the patient or authorized representative who has indicated his/her understanding and acceptance.   Dental advisory given  Plan Discussed with: CRNA  Anesthesia Plan Comments:         Anesthesia Quick Evaluation

## 2018-10-02 NOTE — Progress Notes (Signed)
ANTICOAGULATION CONSULT NOTE - Follow Up Consult  Pharmacy Consult for heparin Indication: atrial fibrillation  Allergies  Allergen Reactions  . Ace Inhibitors Other (See Comments)    angioedema  . Keflex [Cephalexin] Swelling and Other (See Comments)    lips  . Rifadin [Rifampin] Swelling and Other (See Comments)    lips    Patient Measurements: Height: 5\' 2"  (157.5 cm) Weight: 183 lb (83 kg) IBW/kg (Calculated) : 50.1 Heparin Dosing Weight: 69.7  Vital Signs: Temp: 98.6 F (37 C) (12/26 1228) Temp Source: Oral (12/26 1228) BP: 139/70 (12/26 1228) Pulse Rate: 71 (12/26 1228)  Labs: Recent Labs    09/29/18 2148  09/30/18 1608  09/30/18 2206 10/01/18 0617 10/01/18 1419 10/02/18 0408  HGB 5.2*   < > 8.5*  --   --  8.3*  --  8.7*  HCT 21.0*   < > 28.6*  --   --  27.7*  --  30.1*  PLT 530*  --   --   --   --  447*  --  466*  APTT  --   --   --   --  38*  --   --   --   LABPROT 15.9*  --   --   --   --   --   --   --   INR 1.29  --   --   --   --   --   --   --   HEPARINUNFRC  --   --   --    < > 0.24* 0.35 0.28* 0.11*  CREATININE 0.71  --   --   --   --  0.74  --  0.78  TROPONINI <0.03  --   --   --   --   --   --   --    < > = values in this interval not displayed.    Estimated Creatinine Clearance: 58.8 mL/min (by C-G formula based on SCr of 0.78 mg/dL).   Medications:  Scheduled:  . amLODipine  5 mg Oral Daily  . hydrochlorothiazide  12.5 mg Oral Daily  . insulin aspart  0-9 Units Subcutaneous Q4H  . metoprolol succinate  50 mg Oral Daily  . pantoprazole (PROTONIX) IV  40 mg Intravenous Q12H  . simvastatin  20 mg Oral QPM    Assessment: Janet Williamson is a 77 year old female admitted with SOB, outpatient labs with PCP resulted in Hgb of 5. Patient was on xarelto PTA for pAF, last dose was 12/22. Pharmacy consulted to dose heparin.  S/p EGD and colonoscopy - ok to resume heparin  Goal of Therapy:  Heparin level 0.3-0.7 units/ml Monitor platelets by  anticoagulation protocol: Yes   Plan:  Resume heparin at 1150 units / hr at 2 pm Daily heparin level, CBC  Thank you Okey Regal, PharmD [phone] [facility] check [email or web address] for unit-specific pharmacist phone numbers

## 2018-10-02 NOTE — Anesthesia Postprocedure Evaluation (Signed)
Anesthesia Post Note  Patient: Janet Williamson  Procedure(s) Performed: ESOPHAGOGASTRODUODENOSCOPY (EGD) WITH PROPOFOL (N/A ) COLONOSCOPY WITH PROPOFOL (N/A ) GIVENS CAPSULE STUDY (N/A )     Patient location during evaluation: PACU Anesthesia Type: MAC Level of consciousness: awake and alert Pain management: pain level controlled Vital Signs Assessment: post-procedure vital signs reviewed and stable Respiratory status: spontaneous breathing, nonlabored ventilation, respiratory function stable and patient connected to nasal cannula oxygen Cardiovascular status: stable and blood pressure returned to baseline Postop Assessment: no apparent nausea or vomiting Anesthetic complications: no    Last Vitals:  Vitals:   10/02/18 1125 10/02/18 1228  BP: (!) 149/64 139/70  Pulse: 64 71  Resp: 15 18  Temp:  37 C  SpO2: 100% 100%    Last Pain:  Vitals:   10/02/18 1228  TempSrc: Oral  PainSc:                  Montez Hageman

## 2018-10-02 NOTE — Progress Notes (Signed)
[facility]                                                                              Patient Demographics  Janet Williamson, is a 77 y.o. female, DOB - 04/17/1941, [id number]  Admit date - 09/29/2018   Admitting Physician Hillary BowJared M Gardner, DO  Outpatient Primary MD for the patient is Maurice SmallGriffin, [name], MD  Outpatient specialists:   LOS - 2  days   Medical records reviewed and are as summarized below:    Chief Complaint  Patient presents with  . Shortness of Breath  . 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  Patient went to see her PCP, hemoglobin was 5, baseline 8-9.  Patient was sent to ED for further evaluation. FOBT positive.  Per patient no prior history of GIB.   Assessment & Plan    Acute on chronic symptomatic anemia in the setting of anticoagulation with Xarelto, GI bleed -Baseline hemoglobin 8-9, presented with hemoglobin of 5.2, status post 3 units of packed RBC transfusion  -Patient was started on IV heparin drip due to history of paroxysmal A. fib, TAVR, prior history of embolic stroke -Anemia panel with ferritin 9, percent saturation ratio 29 -GI was consulted, patient underwent endoscopy.  Colonoscopy was normal, endoscopy showed normal esophagus, nonbleeding erosive gastropathy, normal duodenal bulb, first and second portion.  Pursuing capsule endoscopy. -Continue clear liquid diet   Active Problems:   DM (diabetes mellitus), type II, non-insulin-dependent, with neurological complications, history of prior stroke (HCC) -CBGs stable, continue to hold metformin, continue sliding scale insulin  -Hemoglobin A1c 5.4   HTN (hypertension) -Continue Toprol-XL, HCTZ, amlodipine  History of prior embolic stroke in  2008 -Continue statin, hold aspirin and Xarelto due to #1, currently on IV heparin drip    PAF (paroxysmal atrial fibrillation) (HCC) -Currently rate controlled, hold Xarelto due to GI bleed Currently on IV heparin drip    S/P TAVR (transcatheter aortic valve replacement) in 04/2018 -Currently on aspirin 81 mg daily and Xarelto 20 mg daily, reviewed cardiology office notes, by Dr. Anne FuSkains 08/2018, patient was to stop aspirin in January 2020 -Continue IV heparin drip  Hyperlipidemia Continue statin  Code Status: Full code DVT Prophylaxis: IV heparin drip Family Communication: Discussed in detail with the patient, all imaging results, lab results explained to the patient, 3 daughters and granddaughter in the room  Disposition Plan: Disposition plan after capsule endoscopy results  Time Spent in minutes   25-minutes  Procedures:  EGD, colonoscopy  Consultants:   Gastroenterology  Antimicrobials:      Medications  Scheduled Meds: . amLODipine  5 mg Oral Daily  . hydrochlorothiazide  12.5 mg Oral Daily  . insulin aspart  0-9 Units Subcutaneous Q4H  . metoprolol succinate  50 mg Oral Daily  . pantoprazole (PROTONIX) IV  40 mg Intravenous Q12H  . simvastatin  20 mg Oral QPM   Continuous Infusions: .  heparin     PRN Meds:.acetaminophen **OR** acetaminophen, ondansetron **OR** ondansetron (ZOFRAN) IV   Antibiotics   Anti-infectives (From admission, onward)   None        Subjective:   Janet Williamson was seen and examined today.  Seen after the procedures, wants to sleep.  No complaints, no bleeding overnight. Patient denies abdominal pain, N/V/D/C, new weakness, numbess, tingling.   Objective:   Vitals:   10/02/18 1105 10/02/18 1115 10/02/18 1125 10/02/18 1228  BP: (!) 110/39 (!) 107/40 (!) 149/64 139/70  Pulse: 67 68 64 71  Resp: 12 18 15 18   Temp:    98.6 F (37 C)  TempSrc:    Oral  SpO2: 100% 100% 100% 100%  Weight:      Height:         Intake/Output Summary (Last 24 hours) at 10/02/2018 1244 Last data filed at 10/02/2018 1053 Gross per 24 hour  Intake 1013.23 ml  Output 0 ml  Net 1013.23 ml     Wt Readings from Last 3 Encounters:  10/02/18 83 kg  08/25/18 85.6 kg  05/21/18 86.2 kg    Physical Exam General: Alert and oriented x 3, NAD Eyes HEENT:   Cardiovascular: S1 S2 clear, RRR, no pedal edema bilaterally  Respiratory: CTA B no wheezing, or rhonchi  gastrointestinal: Soft, nontender, nondistended, + bowel sounds Ext: no pedal edema bilaterally Neuro: No new deficits Musculoskeletal: No digital cyanosis, clubbing Skin: No rashes Psych: Normal affect and demeanor, alert and oriented x3    Data Reviewed:  I have personally reviewed following labs and imaging studies  Micro Results No results found for this or any previous visit (from the past 240 hour(s)).  Radiology Reports Dg Chest 2 View  Result Date: 09/30/2018 CLINICAL DATA:  Dyspnea on exertion. History of aortic valve replacement. EXAM: CHEST - 2 VIEW COMPARISON:  Chest radiograph April 15, 2018 FINDINGS: Cardiac silhouette is mildly enlarged. Status post aortic valve replacement. Calcified aortic arch. Pulmonary vascular congestion without pleural effusion or focal consolidation. LEFT mid lung zone scarring. No pneumothorax. Soft tissue planes and included osseous structures are non suspicious. IMPRESSION: 1. Mild cardiomegaly and pulmonary vascular congestion. 2.  Aortic Atherosclerosis (ICD10-I70.0). Electronically Signed   By: Awilda Metro M.D.   On: 09/30/2018 00:22    Lab Data:  CBC: Recent Labs  Lab 09/29/18 2148 09/30/18 0719 09/30/18 1608 10/01/18 0617 10/02/18 0408  WBC 12.5*  --   --  10.2 11.3*  HGB 5.2* 7.2* 8.5* 8.3* 8.7*  HCT 21.0* 25.2* 28.6* 27.7* 30.1*  MCV 64.0*  --   --  69.1* 69.5*  PLT 530*  --   --  447* 466*   Basic Metabolic Panel: Recent Labs  Lab 09/29/18 2148 10/01/18 0617 10/02/18 0408  NA  137 138 137  K 3.4* 3.5 3.2*  CL 100 101 102  CO2 26 27 26   GLUCOSE 186* 106* 96  BUN 10 6* 5*  CREATININE 0.71 0.74 0.78  CALCIUM 9.6 9.5 9.5   GFR: Estimated Creatinine Clearance: 58.8 mL/min (by C-G formula based on SCr of 0.78 mg/dL). Liver Function Tests: Recent Labs  Lab 09/29/18 2148  AST 18  ALT 11  ALKPHOS 54  BILITOT 0.3  PROT 8.0  ALBUMIN 3.4*   Recent Labs  Lab 09/29/18 2148  LIPASE 23   No results for input(s): AMMONIA in the last 168 hours. Coagulation Profile: Recent Labs  Lab 09/29/18 2148  INR 1.29   Cardiac Enzymes: Recent Labs  Lab 09/29/18 2148  TROPONINI <0.03   BNP (last 3 results) No results for input(s): PROBNP in the last 8760 hours. HbA1C: Recent Labs    10/01/18 0617  HGBA1C 5.4   CBG: Recent Labs  Lab 10/01/18 1652 10/01/18 2002 10/01/18 2354 10/02/18 0726 10/02/18 1155  GLUCAP 75 110* 90 91 86   Lipid Profile: No results for input(s): CHOL, HDL, LDLCALC, TRIG, CHOLHDL, LDLDIRECT in the last 72 hours. Thyroid Function Tests: No results for input(s): TSH, T4TOTAL, FREET4, T3FREE, THYROIDAB in the last 72 hours. Anemia Panel: Recent Labs    09/30/18 0922  VITAMINB12 114*  FOLATE 25.7  FERRITIN 9*  TIBC 463*  IRON 133  RETICCTPCT 0.6   Urine analysis:    Component Value Date/Time   COLORURINE YELLOW 09/30/2018 0013   APPEARANCEUR CLEAR 09/30/2018 0013   LABSPEC 1.013 09/30/2018 0013   PHURINE 7.0 09/30/2018 0013   GLUCOSEU NEGATIVE 09/30/2018 0013   GLUCOSEU NEGATIVE 12/30/2014 0758   HGBUR NEGATIVE 09/30/2018 0013   BILIRUBINUR NEGATIVE 09/30/2018 0013   KETONESUR NEGATIVE 09/30/2018 0013   PROTEINUR NEGATIVE 09/30/2018 0013   UROBILINOGEN 0.2 12/30/2014 0758   NITRITE NEGATIVE 09/30/2018 0013   LEUKOCYTESUR NEGATIVE 09/30/2018 0013     Janet Williamson M.D. [facility] 10/02/2018, 12:44 PM  Pager: [phone] Between 7am to 7pm - call Pager - [phone]  After 7pm go to [email or web address] -  password [email or web address]  Call night coverage person covering after 7pm

## 2018-10-02 NOTE — Op Note (Signed)
[facility] Patient Name: Janet Williamson Procedure Date : 10/02/2018 MRN: [id number] Attending MD: Janet Williamson , MD Date of Birth: 1941-10-07 CSN: [id number] Age: 77 Admit Type: Inpatient Procedure:                Upper GI endoscopy Indications:              Iron deficiency anemia, Heme positive stool Providers:                Janet Modena, MD, Zoe Lan, RN, Beryle Beams, Technician Referring MD:              Medicines:                Monitored Anesthesia Care Complications:            No immediate complications. Estimated Blood Loss:     Estimated blood loss: none. Procedure:                Pre-Anesthesia Assessment:                           - Prior to the procedure, a History and Physical                            was performed, and patient medications and                            allergies were reviewed. The patient's tolerance of                            previous anesthesia was also reviewed. The risks                            and benefits of the procedure and the sedation                            options and risks were discussed with the patient.                            All questions were answered, and informed consent                            was obtained. Prior Anticoagulants: The patient has                            taken heparin, last dose was day of procedure. ASA                            Grade Assessment: III - A patient with severe                            systemic disease. After reviewing the risks and  benefits, the patient was deemed in satisfactory                            condition to undergo the procedure.                           After obtaining informed consent, the endoscope was                            passed under direct vision. Throughout the                            procedure, the patient's blood pressure, pulse, and                            oxygen  saturations were monitored continuously. The                            [id number]-[id number] ([id number]) Olympus Adult EGD was introduced                            through the mouth, and advanced to the second part                            of duodenum. The upper GI endoscopy was                            accomplished without difficulty. The patient                            tolerated the procedure well. Scope In: Scope Out: Findings:      The examined esophagus was normal.      A few localized, small non-bleeding erosions were found in the       prepyloric region of the stomach. There were no stigmata of recent       bleeding.      The exam of the stomach was otherwise normal.      The duodenal bulb, first portion of the duodenum and second portion of       the duodenum were normal.      No old or fresh blood seen to extent of our examination. Impression:               - Normal esophagus.                           - Non-bleeding erosive gastropathy.                           - Normal duodenal bulb, first portion of the                            duodenum and second portion of the duodenum.                           - No specimens collected. Moderate Sedation:  None Recommendation:           - Perform a colonoscopy today. Procedure Code(s):        --- Professional ---                           [id number], Esophagogastroduodenoscopy, flexible,                            transoral; diagnostic, including collection of                            specimen(s) by brushing or washing, when performed                            (separate procedure) Diagnosis Code(s):        --- Professional ---                           K31.89, Other diseases of stomach and duodenum                           D50.9, Iron deficiency anemia, unspecified                           R19.5, Other fecal abnormalities CPT copyright 2018 American [employer] Association. All rights reserved. The codes documented in this report are  preliminary and upon coder review may  be revised to meet current compliance requirements. Janet ModenaWilliam Suzy Kugel, MD 10/02/2018 11:05:02 AM This report has been signed electronically. Number of Addenda: 0

## 2018-10-02 NOTE — Transfer of Care (Signed)
Immediate Anesthesia Transfer of Care Note  Patient: Janet Williamson  Procedure(s) Performed: ESOPHAGOGASTRODUODENOSCOPY (EGD) WITH PROPOFOL (N/A ) COLONOSCOPY WITH PROPOFOL (N/A ) GIVENS CAPSULE STUDY (N/A )  Patient Location: Endoscopy Unit  Anesthesia Type:MAC  Level of Consciousness: drowsy  Airway & Oxygen Therapy: Patient Spontanous Breathing and Patient connected to nasal cannula oxygen  Post-op Assessment: Report given to RN and Post -op Vital signs reviewed and stable  Post vital signs: Reviewed and stable  Last Vitals:  Vitals Value Taken Time  BP 96/36 10/02/2018 10:53 AM  Temp    Pulse 70 10/02/2018 10:53 AM  Resp 17 10/02/2018 10:53 AM  SpO2 99 % 10/02/2018 10:53 AM  Vitals shown include unvalidated device data.  Last Pain:  Vitals:   10/02/18 0915  TempSrc: Oral  PainSc: 0-No pain         Complications: No apparent anesthesia complications

## 2018-10-02 NOTE — Progress Notes (Signed)
Patient swallowed capsule at 11:25am.  Reviewed instructions and patient verbalized understanding.  Informed bedside RN of procedure and instruction sheet given.  Instructed to remove monitor after 11:25pm and return recorder tomorrow morning.

## 2018-10-02 NOTE — Anesthesia Procedure Notes (Signed)
Procedure Name: MAC Date/Time: 10/02/2018 10:10 AM Performed by: Colin Benton, CRNA Pre-anesthesia Checklist: Patient identified, Emergency Drugs available, Suction available and Patient being monitored Patient Re-evaluated:Patient Re-evaluated prior to induction Oxygen Delivery Method: Nasal cannula Preoxygenation: Pre-oxygenation with 100% oxygen Induction Type: IV induction Placement Confirmation: positive ETCO2 Dental Injury: Teeth and Oropharynx as per pre-operative assessment

## 2018-10-02 NOTE — Interval H&P Note (Signed)
History and Physical Interval Note:  10/02/2018 10:03 AM  Janet Williamson  has presented today for surgery, with the diagnosis of anemia, FOBT positive, was on xarelto, last dose on Sunday  The various methods of treatment have been discussed with the patient and family. After consideration of risks, benefits and other options for treatment, the patient has consented to  Procedure(s): ESOPHAGOGASTRODUODENOSCOPY (EGD) WITH PROPOFOL (N/A) COLONOSCOPY WITH PROPOFOL (N/A) as a surgical intervention .  The patient's history has been reviewed, patient examined, no change in status, stable for surgery.  I have reviewed the patient's chart and labs.  Questions were answered to the patient's satisfaction.     Freddy Jaksch

## 2018-10-02 NOTE — Op Note (Signed)
[facility] Patient Name: Janet Williamson Procedure Date : 10/02/2018 MRN: [id number] Attending MD: Janet Williamson , MD Date of Birth: 05/02/1941 CSN: [id number] Age: 77 Admit Type: Inpatient Procedure:                Colonoscopy Indications:              Heme positive stool, Iron deficiency anemia Providers:                Janet Modena, MD, Zoe Lan, RN, Beryle Beams, Technician Referring MD:              Medicines:                Monitored Anesthesia Care Complications:            No immediate complications. Estimated Blood Loss:     Estimated blood loss: none. Procedure:                Pre-Anesthesia Assessment:                           - Prior to the procedure, a History and Physical                            was performed, and patient medications and                            allergies were reviewed. The patient's tolerance of                            previous anesthesia was also reviewed. The risks                            and benefits of the procedure and the sedation                            options and risks were discussed with the patient.                            All questions were answered, and informed consent                            was obtained. Prior Anticoagulants: The patient has                            taken heparin, last dose was day of procedure. ASA                            Grade Assessment: III - A patient with severe                            systemic disease. After reviewing the risks and  benefits, the patient was deemed in satisfactory                            condition to undergo the procedure.                           After obtaining informed consent, the colonoscope                            was passed under direct vision. Throughout the                            procedure, the patient's blood pressure, pulse, and                            oxygen  saturations were monitored continuously. The                            Colonoscope was introduced through the anus and                            advanced to the the cecum, identified by                            appendiceal orifice and ileocecal valve. The                            ileocecal valve, appendiceal orifice, and rectum                            were photographed. The entire colon was examined.                            The colonoscopy was performed without difficulty.                            The patient tolerated the procedure well. The                            quality of the bowel preparation was good. Scope In: 10:31:00 AM Scope Out: 10:50:40 AM Scope Withdrawal Time: 0 hours 13 minutes 49 seconds  Total Procedure Duration: 0 hours 19 minutes 40 seconds  Findings:      The perianal and digital rectal examinations were normal.      The colon (entire examined portion) appeared normal.      The retroflexed view of the distal rectum and anal verge was normal and       showed no anal or rectal abnormalities. Impression:               - The entire examined colon is normal.                           - The distal rectum and anal verge are normal on  retroflexion view.                           - No specimens collected. No source anemia or                            hemoccult positive stools seen on endoscopy or                            colonoscopy. Moderate Sedation:      None Recommendation:           - Return patient to hospital ward for ongoing care.                           - NPO for 2 hours, then after 2 hours can start                            clear liquids.                           - To visualize the small bowel, perform video                            capsule endoscopy today.                           - Ok to restart heparin gtt now; would not restart                            oral anticoagulants until we see what capsule                             endoscopy shows.                           Deboraha Sprang- [facility] will follow. Procedure Code(s):        --- Professional ---                           [id number], Colonoscopy, flexible; diagnostic, including                            collection of specimen(s) by brushing or washing,                            when performed (separate procedure) Diagnosis Code(s):        --- Professional ---                           R19.5, Other fecal abnormalities                           D50.9, Iron deficiency anemia, unspecified CPT copyright 2018 American Medical Association. All rights reserved. The codes documented in this report are preliminary and upon coder review may  be revised to meet current compliance requirements. Janet ModenaWilliam Janet Morikawa,  MD 10/02/2018 11:09:11 AM This report has been signed electronically. Number of Addenda: 0

## 2018-10-03 ENCOUNTER — Encounter (HOSPITAL_COMMUNITY): Payer: Self-pay | Admitting: Gastroenterology

## 2018-10-03 ENCOUNTER — Telehealth: Payer: Self-pay | Admitting: Cardiology

## 2018-10-03 DIAGNOSIS — Z794 Long term (current) use of insulin: Secondary | ICD-10-CM

## 2018-10-03 DIAGNOSIS — E0800 Diabetes mellitus due to underlying condition with hyperosmolarity without nonketotic hyperglycemic-hyperosmolar coma (NKHHC): Secondary | ICD-10-CM

## 2018-10-03 LAB — GLUCOSE, CAPILLARY
Glucose-Capillary: 118 mg/dL — ABNORMAL HIGH (ref 70–99)
Glucose-Capillary: 85 mg/dL (ref 70–99)
Glucose-Capillary: 90 mg/dL (ref 70–99)
Glucose-Capillary: 90 mg/dL (ref 70–99)
Glucose-Capillary: 92 mg/dL (ref 70–99)
Glucose-Capillary: 97 mg/dL (ref 70–99)

## 2018-10-03 LAB — BASIC METABOLIC PANEL
Anion gap: 12 (ref 5–15)
BUN: 6 mg/dL — ABNORMAL LOW (ref 8–23)
CO2: 23 mmol/L (ref 22–32)
Calcium: 9.6 mg/dL (ref 8.9–10.3)
Chloride: 103 mmol/L (ref 98–111)
Creatinine, Ser: 0.82 mg/dL (ref 0.44–1.00)
GFR calc Af Amer: >60 mL/min (ref >60)
GFR calc non Af Amer: >60 mL/min (ref >60)
Glucose, Bld: 95 mg/dL (ref 70–99)
POTASSIUM: 3.4 mmol/L — AB (ref 3.5–5.1)
Sodium: 138 mmol/L (ref 135–145)

## 2018-10-03 LAB — CBC
HCT: 31.5 % — ABNORMAL LOW (ref 36.0–46.0)
HEMOGLOBIN: 8.8 g/dL — AB (ref 12.0–15.0)
MCH: 19.8 pg — ABNORMAL LOW (ref 26.0–34.0)
MCHC: 27.9 g/dL — ABNORMAL LOW (ref 30.0–36.0)
MCV: 70.8 fL — ABNORMAL LOW (ref 80.0–100.0)
Platelets: 441 10*3/uL — ABNORMAL HIGH (ref 150–400)
RBC: 4.45 MIL/uL (ref 3.87–5.11)
RDW: 28.9 % — ABNORMAL HIGH (ref 11.5–15.5)
WBC: 9.3 10*3/uL (ref 4.0–10.5)
nRBC: 0 % (ref 0.0–0.2)

## 2018-10-03 LAB — HEPARIN LEVEL (UNFRACTIONATED): HEPARIN UNFRACTIONATED: 0.43 [IU]/mL (ref 0.30–0.70)

## 2018-10-03 MED ORDER — RIVAROXABAN 20 MG PO TABS: 20.0000 mg | ORAL_TABLET | Freq: Every day | ORAL | Status: DC

## 2018-10-03 MED ORDER — HEPARIN (PORCINE) 25000 UT/250ML-% IV SOLN: 1150.0000 [IU]/h | INTRAVENOUS | Status: AC

## 2018-10-03 MED ORDER — APIXABAN 5 MG PO TABS
5.0000 mg | ORAL_TABLET | Freq: Two times a day (BID) | ORAL | Status: DC
  Administered 2018-10-03 – 2018-10-04 (×2): 5 mg via ORAL
  Filled 2018-10-03 (×3): qty 1

## 2018-10-03 MED ORDER — POTASSIUM CHLORIDE CRYS ER 20 MEQ PO TBCR
40.0000 meq | EXTENDED_RELEASE_TABLET | Freq: Once | ORAL | Status: AC
  Administered 2018-10-03: 40 meq via ORAL
  Filled 2018-10-03: qty 2

## 2018-10-03 NOTE — Progress Notes (Addendum)
[facility]                                                                              Patient Demographics  Janet Williamson, is a 77 y.o. female, DOB - 12/28/1940, ZDG:[id number]  Admit date - 09/29/2018   Admitting Physician Hillary BowJared M Gardner, DO  Outpatient Primary MD for the patient is Maurice SmallGriffin, [name], MD  Outpatient specialists:   LOS - 3  days   Medical records reviewed and are as summarized below:    Chief Complaint  Patient presents with  . Shortness of Breath  . 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  Patient went to see her PCP, hemoglobin was 5, baseline 8-9.  Patient was sent to ED for further evaluation. FOBT positive.  Per patient no prior history of GIB.   Assessment & Plan    Acute on chronic symptomatic anemia in the setting of anticoagulation with Xarelto, GI bleed -Baseline hemoglobin 8-9, presented with hemoglobin of 5.2, status post 3 units of packed RBC transfusion  -Patient was started on IV heparin drip due to history of paroxysmal A. fib, TAVR, prior history of embolic stroke -Anemia panel with ferritin 9, percent saturation ratio 29 -GI was consulted, patient underwent endoscopy.  Colonoscopy was normal, endoscopy showed normal esophagus, nonbleeding erosive gastropathy, normal duodenal bulb, first and second portion.   -Capsule endoscopy in progress, hemoglobin stable 8.8  Addendum: 2: 40pm  Received call from Dr Dulce Sellarutlaw (GI) that there was a Ship brokercomputer glitch with a capsule endoscopy results and will not be available till Monday. He recommended that the patient can be restarted on the anticoagulation that she needs from cardiac standpoint.  GI will contact her outpatient regarding the results. -Called cardiology  consult as patient is on aspirin and Xarelto prior to admission, admitting hemoglobin was 5.2.  She is currently on heparin drip and did not have any rebleeding issues. -Per Dr. Anne FuSkains note 08/2018, patient was to stop aspirin in January 2020.  She likely needs Xarelto given her history of embolic stroke and paroxysmal A. fib. -Advance diet, once patient is started on appropriate anticoagulation per cardiology.  Will DC heparin drip, observe H&H overnight and DC home in a.m. -This was relayed to the patient and family.   Active Problems:   DM (diabetes mellitus), type II, non-insulin-dependent, with neurological complications, history of prior stroke (HCC) -CBGs stable, continue to hold metformin, continue sliding scale insulin  -Hemoglobin A1c 5.4   HTN (hypertension) -BP stable, continue Toprol-XL, HCTZ and amlodipine   History of prior embolic stroke in 2008 -Continue statin, hold aspirin and Xarelto due to #1 -Continue IV heparin drip     PAF (paroxysmal atrial fibrillation) (HCC) -Currently rate controlled, hold Xarelto due to GI bleed Currently on IV heparin drip    S/P TAVR (transcatheter aortic valve replacement) in 04/2018 -Currently on aspirin 81 mg daily and Xarelto 20 mg daily, reviewed  cardiology office notes, by Dr. Anne Fu 08/2018, patient was to stop aspirin in January 2020 -Continue IV heparin drip  Hyperlipidemia Continue statin  Hypokalemia Replaced  Code Status: Full code DVT Prophylaxis: IV heparin drip Family Communication: Discussed in detail with the patient, all imaging results, lab results explained to the patient and 2 daughters in the room  Disposition Plan: Capsule endoscopy in process, will await results  Time Spent in minutes 25 minutes  Procedures:  EGD, colonoscopy  Consultants:   Gastroenterology  Antimicrobials:      Medications  Scheduled Meds: . amLODipine  5 mg Oral Daily  . hydrochlorothiazide  12.5 mg Oral Daily  .  insulin aspart  0-9 Units Subcutaneous Q4H  . metoprolol succinate  50 mg Oral Daily  . pantoprazole (PROTONIX) IV  40 mg Intravenous Q12H  . simvastatin  20 mg Oral QPM   Continuous Infusions: . heparin 1,150 Units/hr (10/03/18 0257)   PRN Meds:.acetaminophen **OR** acetaminophen, ondansetron **OR** ondansetron (ZOFRAN) IV   Antibiotics   Anti-infectives (From admission, onward)   None        Subjective:   Janet Williamson was seen and examined today.  States no further bleeding noticed.  No chest pain or shortness of breath.  No abdominal pain.   Patient denies  N/V/D/C, new weakness, numbess, tingling.   Objective:   Vitals:   10/02/18 1228 10/02/18 2101 10/03/18 0507 10/03/18 1222  BP: 139/70 (!) 139/58 138/63 (!) 134/55  Pulse: 71 71 76 67  Resp: 18 16 16 20   Temp: 98.6 F (37 C) 98.9 F (37.2 C) 98.4 F (36.9 C) 98.3 F (36.8 C)  TempSrc: Oral Oral Oral Oral  SpO2: 100% 99% 97% 100%  Weight:   82.5 kg   Height:        Intake/Output Summary (Last 24 hours) at 10/03/2018 1319 Last data filed at 10/03/2018 0900 Gross per 24 hour  Intake 628.53 ml  Output 200 ml  Net 428.53 ml     Wt Readings from Last 3 Encounters:  10/03/18 82.5 kg  08/25/18 85.6 kg  05/21/18 86.2 kg   Physical Exam  General: Alert and oriented x 3, NAD  Eyes:   HEENT:    Cardiovascular: S1 S2 clear, no murmurs, RRR. No pedal edema b/l  Respiratory: CTAB, no wheezing, rales or rhonchi  Gastrointestinal: Soft, nontender, nondistended, NBS  Ext: no pedal edema bilaterally  Neuro: no new deficits  Musculoskeletal: No cyanosis, clubbing  Skin: No rashes  Psych: Normal affect and demeanor, alert and oriented x3      Data Reviewed:  I have personally reviewed following labs and imaging studies  Micro Results No results found for this or any previous visit (from the past 240 hour(s)).  Radiology Reports Dg Chest 2 View  Result Date: 09/30/2018 CLINICAL DATA:   Dyspnea on exertion. History of aortic valve replacement. EXAM: CHEST - 2 VIEW COMPARISON:  Chest radiograph April 15, 2018 FINDINGS: Cardiac silhouette is mildly enlarged. Status post aortic valve replacement. Calcified aortic arch. Pulmonary vascular congestion without pleural effusion or focal consolidation. LEFT mid lung zone scarring. No pneumothorax. Soft tissue planes and included osseous structures are non suspicious. IMPRESSION: 1. Mild cardiomegaly and pulmonary vascular congestion. 2.  Aortic Atherosclerosis (ICD10-I70.0). Electronically Signed   By: Awilda Metro M.D.   On: 09/30/2018 00:22    Lab Data:  CBC: Recent Labs  Lab 09/29/18 2148 09/30/18 0719 09/30/18 1608 10/01/18 0617 10/02/18 0408 10/03/18 0541  WBC 12.5*  --   --  10.2 11.3* 9.3  HGB 5.2* 7.2* 8.5* 8.3* 8.7* 8.8*  HCT 21.0* 25.2* 28.6* 27.7* 30.1* 31.5*  MCV 64.0*  --   --  69.1* 69.5* 70.8*  PLT 530*  --   --  447* 466* 441*   Basic Metabolic Panel: Recent Labs  Lab 09/29/18 2148 10/01/18 0617 10/02/18 0408 10/03/18 0541  NA 137 138 137 138  K 3.4* 3.5 3.2* 3.4*  CL 100 101 102 103  CO2 26 27 26 23   GLUCOSE 186* 106* 96 95  BUN 10 6* 5* 6*  CREATININE 0.71 0.74 0.78 0.82  CALCIUM 9.6 9.5 9.5 9.6   GFR: Estimated Creatinine Clearance: 57.2 mL/min (by C-G formula based on SCr of 0.82 mg/dL). Liver Function Tests: Recent Labs  Lab 09/29/18 2148  AST 18  ALT 11  ALKPHOS 54  BILITOT 0.3  PROT 8.0  ALBUMIN 3.4*   Recent Labs  Lab 09/29/18 2148  LIPASE 23   No results for input(s): AMMONIA in the last 168 hours. Coagulation Profile: Recent Labs  Lab 09/29/18 2148  INR 1.29   Cardiac Enzymes: Recent Labs  Lab 09/29/18 2148  TROPONINI <0.03   BNP (last 3 results) No results for input(s): PROBNP in the last 8760 hours. HbA1C: Recent Labs    10/01/18 0617  HGBA1C 5.4   CBG: Recent Labs  Lab 10/02/18 2145 10/03/18 0010 10/03/18 0507 10/03/18 0757 10/03/18 1219    GLUCAP 82 85 90 97 92   Lipid Profile: No results for input(s): CHOL, HDL, LDLCALC, TRIG, CHOLHDL, LDLDIRECT in the last 72 hours. Thyroid Function Tests: No results for input(s): TSH, T4TOTAL, FREET4, T3FREE, THYROIDAB in the last 72 hours. Anemia Panel: No results for input(s): VITAMINB12, FOLATE, FERRITIN, TIBC, IRON, RETICCTPCT in the last 72 hours. Urine analysis:    Component Value Date/Time   COLORURINE YELLOW 09/30/2018 0013   APPEARANCEUR CLEAR 09/30/2018 0013   LABSPEC 1.013 09/30/2018 0013   PHURINE 7.0 09/30/2018 0013   GLUCOSEU NEGATIVE 09/30/2018 0013   GLUCOSEU NEGATIVE 12/30/2014 0758   HGBUR NEGATIVE 09/30/2018 0013   BILIRUBINUR NEGATIVE 09/30/2018 0013   KETONESUR NEGATIVE 09/30/2018 0013   PROTEINUR NEGATIVE 09/30/2018 0013   UROBILINOGEN 0.2 12/30/2014 0758   NITRITE NEGATIVE 09/30/2018 0013   LEUKOCYTESUR NEGATIVE 09/30/2018 0013     Peterson Mathey M.D. [facility] 10/03/2018, 1:19 PM  Pager: [phone] Between 7am to 7pm - call Pager - [phone]  After 7pm go to [email or web address] - password [email or web address]  Call night coverage person covering after 7pm

## 2018-10-03 NOTE — Telephone Encounter (Signed)
New Message   TOC appt made per Ronie Spies for Jan 10th at 12pm

## 2018-10-03 NOTE — Consult Note (Addendum)
Cardiology Consultation:   Patient ID: Janet Williamson; [id number]; [birth year]   Admit date: 09/29/2018 Date of Consult: 10/03/2018  Primary Care Provider: Maurice Small, MD Primary Cardiologist: Donato Schultz, MD   Chief Complaint: shortness of breath (hemoglobin 5.2)  Patient Profile:   Janet Williamson is a 77 y.o. 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 Isidoro Donning.  History of Present Illness:   Per notes, she was seen by Dr. Laneta Simmers in 2008 when she presented with an embolic stroke and was found to have a mass on her mitral valve.It was unclear if this was a fibroblastoma or papilloma or possibly dystrophic calcification and it was followed clinically. She was previously on ASA/Plavix. However, in 2018 she developed paroxysmal afib and was changed to Xarelto. Plavix was stopped. In 02/2018 she developed progressive severe AS with preserved LVEF 55-60%. She underwent cardiac catheterization on 03/06/2018 which showed no significant coronary disease. She was evaluated by Dr. Excell Seltzer and a repeat TEE was performed to evaluate the mitral valve given her history of a mitral valve mass. This showed no evidence of mitral valve mass or vegetation. She underwentsuccessful TAVR with a40mm Edwards Sapien 3 THV via the TF approach on 04/15/18. Post operative echo showed higher than expected transvalvular aortic gradients likely related to patient-prosthesis mismatch with a mean gradient of 22 mmHgwithno paravalvular regurgitation. She was discharged the following day on Asprin 81 mg daily and home Xarelto. The structural team recommended that she could discontinue ASA after 6 months of therapy (10/2018). F/u echo 05/21/18 showed EF 55-60%, no PVL and mean  gradient 24mm Hg - per structural team, her gradients are felt to be elevated from patient prosthetic mismatch. There was also moderate TR/PR, grade 1 DD, and mildly increased PASP. She was seen by Dr. Anne Fu 08/25/18 -transient dizziness was listed in assessment therefore event monitor was planned which is in progress.  Her baseline Hgb is around the 8-9 range but she was admitted with progressive shortness of breath and found to have hemoglobin of 5.2. She reports she'd been feeling this way for a few weeks but it got worse recently. Although she had not noticed any stigmata of GI bleeding, stools were heme positive. Aspirin and Xarelto were held and she was transfused 2 units of blood. EKG 12/26 showed non-bleeding erosive gastropathy and colonoscopy was normal without obvious abnormalities. Heparin was restarted. She swallowed capsule for capsule endoscopy on 12/26 however there was a Ship broker and results will not be available until Monday. Per consult note, "He recommended that the patient can be restarted on the anticoagulation that she needs from cardiac standpoint.  GI will contact her outpatient regarding the results." F/u Hgb 8.8 today, microcytic. Ferritin level was low, but iron was normal; TIBC was elevated. B12 was also low. She feels back to baseline today and ambulated without dyspnea.  Past Medical History:  Diagnosis Date  . Anemia   . Cardiac mass    a. 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 Not seen on most recent TEE 2019.  . Cardiomyopathy in other disease   . Cataracts, bilateral   . Diabetes mellitus    Type 2  . Generalized osteoarthritis   . GERD (gastroesophageal reflux disease)   . HLD (hyperlipidemia)   .  Hypertension   . Hypothyroidism   . LBBB (left bundle branch block)   . PAF (paroxysmal atrial fibrillation) (HCC)    a. s/p DCCV 06/2017, on Xarelto  . Phlebitis   . S/P TAVR (transcatheter aortic valve replacement) 04/15/2018   Edwards Sapien 3 THV (size  23 mm, model # 9600TFX, serial # [id number]) via the TF approach  . Severe aortic stenosis    a. s/p TAVR 04/2018.  . Stroke [facility]) 2008    Past Surgical History:  Procedure Laterality Date  . ABDOMINAL HYSTERECTOMY    . COLONOSCOPY    . COLONOSCOPY WITH PROPOFOL N/A 10/02/2018   Procedure: COLONOSCOPY WITH PROPOFOL;  Surgeon: [name], MD;  Location: [facility] ENDOSCOPY;  Service: Endoscopy;  Laterality: N/A;  . ESOPHAGOGASTRODUODENOSCOPY (EGD) WITH PROPOFOL N/A 10/02/2018   Procedure: ESOPHAGOGASTRODUODENOSCOPY (EGD) WITH PROPOFOL;  Surgeon: [name], MD;  Location: [facility] ENDOSCOPY;  Service: Endoscopy;  Laterality: N/A;  . EYE SURGERY Bilateral    cataract removal  . GIVENS CAPSULE STUDY N/A 10/02/2018   Procedure: GIVENS CAPSULE STUDY;  Surgeon: [name], MD;  Location: [facility] ENDOSCOPY;  Service: Endoscopy;  Laterality: N/A;  . RIGHT/LEFT HEART CATH AND CORONARY ANGIOGRAPHY N/A 03/06/2018   Procedure: RIGHT/LEFT HEART CATH AND CORONARY ANGIOGRAPHY;  Surgeon: Lyn Records, MD;  Location: [facility] INVASIVE CV LAB;  Service: Cardiovascular;  Laterality: N/A;  . TEE WITHOUT CARDIOVERSION N/A 04/01/2018   Procedure: TRANSESOPHAGEAL ECHOCARDIOGRAM (TEE);  Surgeon: Jake Bathe, MD;  Location[facility] ENDOSCOPY;  Service: Cardiovascular;  Laterality: N/A;  . TEE WITHOUT CARDIOVERSION N/A 04/15/2018   Procedure: TRANSESOPHAGEAL ECHOCARDIOGRAM (TEE);  Surgeon: Tonny Bollman, MD;  Location: [facility] OR;  Service: Open Heart Surgery;  Laterality: N/A;  . TOTAL KNEE ARTHROPLASTY Right 04/14/2013   Dr August Saucer  . TOTAL KNEE ARTHROPLASTY Right 04/14/2013   Procedure: TOTAL KNEE ARTHROPLASTY;  Surgeon: Cammy Copa, MD;  Location: [facility] OR;  Service: Orthopedics;  Laterality: Right;  . TRANSCATHETER AORTIC VALVE REPLACEMENT, TRANSFEMORAL  04/15/2018  . TRANSCATHETER AORTIC VALVE REPLACEMENT, TRANSFEMORAL Bilateral 04/15/2018   Procedure: TRANSCATHETER AORTIC VALVE REPLACEMENT, TRANSFEMORAL;  Surgeon: Tonny Bollman, MD;  Location: [facility] OR;  Service: Open Heart Surgery;  Laterality: Bilateral;     Inpatient Medications: Scheduled Meds: . amLODipine  5 mg Oral Daily  . hydrochlorothiazide  12.5 mg Oral Daily  . insulin aspart  0-9 Units Subcutaneous Q4H  . metoprolol succinate  50 mg Oral Daily  . pantoprazole (PROTONIX) IV  40 mg Intravenous Q12H  . simvastatin  20 mg Oral QPM   Continuous Infusions: . heparin 1,150 Units/hr (10/03/18 1500)   PRN Meds: acetaminophen **OR** acetaminophen, ondansetron **OR** ondansetron (ZOFRAN) IV  Home Meds: Prior to Admission medications   Medication Sig Start Date End Date Taking? Authorizing Provider  amLODipine (NORVASC) 5 MG tablet Take 5 mg by mouth daily.   Yes [provider]  aspirin EC 81 MG tablet Take 1 tablet (81 mg total) by mouth daily. 04/17/18  Yes Janetta Hora, PA-C  Cholecalciferol (VITAMIN D3) 5000 units CAPS Take 5,000 Units by mouth daily.   Yes [provider]  glucose blood (ACCU-CHEK AVIVA) test strip Use as instructed to check once daily DX: E11.9 07/02/18  Yes Reather Littler, MD  hydrochlorothiazide (HYDRODIURIL) 25 MG tablet Take 12.5 mg by mouth daily.    Yes [provider]  metFORMIN (GLUCOPHAGE-XR) 750 MG 24 hr tablet TAKE 2 TABLETS BY MOUTH ONCE DAILY WITH BREAKFAST Patient taking differently: Take 1,500 mg by  mouth daily with breakfast.  09/08/18  Yes Reather LittlerKumar, Ajay, MD  metoprolol succinate (TOPROL-XL) 50 MG 24 hr tablet Take 50 mg by mouth daily.    Yes [provider]  Polyethyl Glycol-Propyl Glycol (SYSTANE OP) Place 1 drop into both eyes 2 (two) times daily.    Yes [provider]  rivaroxaban (XARELTO) 20 MG TABS tablet Take 1 tablet (20 mg total) daily with supper by mouth. 08/26/17  Yes Skains, Veverly FellsMark C, MD  simvastatin (ZOCOR) 20 MG tablet Take 20 mg by mouth every evening.   Yes [provider]    Allergies:    Allergies  Allergen Reactions  . Ace Inhibitors  Other (See Comments)    angioedema  . Keflex [Cephalexin] Swelling and Other (See Comments)    lips  . Rifadin [Rifampin] Swelling and Other (See Comments)    lips    Social History:   Social History   Tobacco Use  . Smoking status: Never Smoker  . Smokeless tobacco: Never Used  Substance Use Topics  . Alcohol use: No  . Drug use: No   Social History   Social History Narrative  . Not on file     Family History:   The patient's family history includes Hypertension in her father and mother; Stroke in her mother. There is no history of Heart attack.  ROS:  Please see the history of present illness. No CP, palpitations, syncope, other signs of bleeding. No fevers or chills. All other ROS reviewed and negative.     Physical Exam/Data:   Vitals:   10/02/18 1228 10/02/18 2101 10/03/18 0507 10/03/18 1222  BP: 139/70 (!) 139/58 138/63 (!) 134/55  Pulse: 71 71 76 67  Resp: 18 16 16 20   Temp: 98.6 F (37 C) 98.9 F (37.2 C) 98.4 F (36.9 C) 98.3 F (36.8 C)  TempSrc: Oral Oral Oral Oral  SpO2: 100% 99% 97% 100%  Weight:   82.5 kg   Height:        Intake/Output Summary (Last 24 hours) at 10/03/2018 1517 Last data filed at 10/03/2018 1500 Gross per 24 hour  Intake 527.04 ml  Output 200 ml  Net 327.04 ml   Filed Weights   10/02/18 0445 10/02/18 0915 10/03/18 0507  Weight: 83 kg 83 kg 82.5 kg   Body mass index is 33.25 kg/m.  General: Well developed, well nourished, in no acute distress. Head: Normocephalic, atraumatic, sclera non-icteric, no xanthomas, nares are without discharge.  Neck: Negative for carotid bruits. JVD not elevated. Lungs: Clear bilaterally to auscultation without wheezes, rales, or rhonchi. Breathing is unlabored. Heart: RRR with S1 S2. No murmurs, rubs, or gallops appreciated. Abdomen: Soft, non-tender, non-distended with normoactive bowel sounds. No hepatomegaly. No rebound/guarding. No obvious abdominal masses. Msk:  Strength and tone appear  normal for age. Extremities: No clubbing or cyanosis. No edema.  Distal pedal pulses are 2+ and equal bilaterally. Neuro: Alert and oriented X 3. No facial asymmetry. No focal deficit. Moves all extremities spontaneously. Psych:  Responds to questions appropriately with a normal affect.  EKG:  The EKG was personally reviewed and demonstrates NSR 84bpm with NSIVCD with LBBB pattern similar to prior  Relevant CV Studies: 2d echo 05/2018 Study Conclusions  - Left ventricle: The cavity size was normal. There was severe   focal basal hypertrophy of the septum. Systolic function was   normal. The estimated ejection fraction was in the range of 55%   to 60%. Wall motion was normal; there were  no regional wall   motion abnormalities. Doppler parameters are consistent with   abnormal left ventricular relaxation (grade 1 diastolic   dysfunction). - Aortic valve: TAVR with a 23 mm Edwards Sapien 3 THV via the TF   approach on 04/15/18. Peak velocity (S): 324 cm/s. Mean gradient   (S): 24 mm Hg. Valve area (Vmax): 1.11 cm^2. - Mitral valve: Moderately calcified annulus. - Left atrium: The atrium was mildly dilated. - Tricuspid valve: There was moderate regurgitation. - Pulmonic valve: There was moderate regurgitation. - Pulmonary arteries: Systolic pressure was mildly increased. PA   peak pressure: 43 mm Hg (S).  Laboratory Data:  Chemistry Recent Labs  Lab 10/01/18 0617 10/02/18 0408 10/03/18 0541  NA 138 137 138  K 3.5 3.2* 3.4*  CL 101 102 103  CO2 27 26 23   GLUCOSE 106* 96 95  BUN 6* 5* 6*  CREATININE 0.74 0.78 0.82  CALCIUM 9.5 9.5 9.6  GFRNONAA >60 >60 >60  GFRAA >60 >60 >60  ANIONGAP 10 9 12     Recent Labs  Lab 09/29/18 2148  PROT 8.0  ALBUMIN 3.4*  AST 18  ALT 11  ALKPHOS 54  BILITOT 0.3   Hematology Recent Labs  Lab 10/01/18 0617 10/02/18 0408 10/03/18 0541  WBC 10.2 11.3* 9.3  RBC 4.01 4.33 4.45  HGB 8.3* 8.7* 8.8*  HCT 27.7* 30.1* 31.5*  MCV 69.1*  69.5* 70.8*  MCH 20.7* 20.1* 19.8*  MCHC 30.0 28.9* 27.9*  RDW 26.7* 27.9* 28.9*  PLT 447* 466* 441*   Cardiac Enzymes Recent Labs  Lab 09/29/18 2148  TROPONINI <0.03   No results for input(s): TROPIPOC in the last 168 hours.  BNP Recent Labs  Lab 09/29/18 2148  BNP 154.7*    DDimer No results for input(s): DDIMER in the last 168 hours.  Radiology/Studies:  Dg Chest 2 View  Result Date: 09/30/2018 CLINICAL DATA:  Dyspnea on exertion. History of aortic valve replacement. EXAM: CHEST - 2 VIEW COMPARISON:  Chest radiograph April 15, 2018 FINDINGS: Cardiac silhouette is mildly enlarged. Status post aortic valve replacement. Calcified aortic arch. Pulmonary vascular congestion without pleural effusion or focal consolidation. LEFT mid lung zone scarring. No pneumothorax. Soft tissue planes and included osseous structures are non suspicious. IMPRESSION: 1. Mild cardiomegaly and pulmonary vascular congestion. 2.  Aortic Atherosclerosis (ICD10-I70.0). Electronically Signed   By: Awilda Metro M.D.   On: 09/30/2018 00:22    Assessment and Plan:   1. Acute on chronic anemia - patient denied any sx of bleeding but had +FOBT stool. EGD/colonoscopy generally unrevealing, but capsule endoscopy results will not be available until Monday. She has been cleared to resume anticoagulation per IM. Will discontinue ASA definitively for now. Long-term will also need eval of underlying baseline anemia as her Hgb has been quite low for some time without obvious workup otherwise. One of the post-TAVR trials, [place], suggested higher rates of bleeding in folks on Xarelto but this was in patients being treated with Xarelto+Plavix vs ASA+Plavix post TAVR (not specifically in afib population). Nevertheless, per discussion with Dr. Herbie Baltimore, plan switch to Eliquis this admission. Have placed Eliquis per pharmacy consult for education as well as care management consult to assess for assistance. Eliquis will be going  generic soon. I spoke with pharmacist personally about plan to continue heparin per pharmacy until first dose on Eliquis. They will reach out to nurse with finalized plan.  2. History of embolic stroke 2008 with questionable MV mass at that time, also  paroxysmal atrial fib - maintaining NSR on telemetry this admission. Prelim event monitor shows brief episodes of what appear to be SVT but no overt atrial fib.  3. Severe AS s/p TAVR 04/2018 - murmur noted on prior exam as well, continue to monitor. See above re ASA.  4. Hypokalemia - being repleted by primary team. Likely needs standing repletion at home given chronic HCTZ.  Would suggest close OP f/u with PCP/GI within a week of DC. Our office will see patient back 10/17/18 (appt placed on AVS).  For questions or updates, please contact [facility] Please consult [email or web address] for contact info under Cardiology/STEMI.   Signed, Laurann Montana, PA-C  10/03/2018 3:17 PM   ATTENDING ATTESTATION  I have seen, examined and evaluated the patient this afternoon along with Ronie Spies, PA.  After reviewing all the available data and chart, we discussed the patients laboratory, study & physical findings as well as symptoms in detail. I agree with her findings, examination as well as impression recommendations as per our discussion.    Status post TAVR roughly 5 and half months ago doing well.  Was on aspirin plus Xarelto but now is coming with a GI bleed.  Currently stable back to her baseline anemia and has not had any further bleeding on IV heparin. Plan will be to stop aspirin continue.  Based on the [facility] trial we will switch from Xarelto to Eliquis.  If stable overnight would be ready for discharge.  We will arrange for outpatient follow-up.  [facility] will sign off.   Medication Recommendations:  starting Eliquis per Pharm; d/c IV Heparin; Not restarting ASA. Other recommendations (labs, testing, etc):  Home Hgb check per primary  team. Follow up as an outpatient:  Will arrange OP f/u.     Bryan Lemma, M.D., M.S. Interventional Cardiologist   Pager # [phone] Phone # [phone] [address] [place], [place] [address]

## 2018-10-03 NOTE — Plan of Care (Signed)

## 2018-10-03 NOTE — Discharge Instructions (Addendum)

## 2018-10-03 NOTE — Progress Notes (Signed)
ANTICOAGULATION CONSULT NOTE - Follow Up Consult  Pharmacy Consult for heparin > Eliquis Indication: atrial fibrillation  Allergies  Allergen Reactions  . Ace Inhibitors Other (See Comments)    angioedema  . Keflex [Cephalexin] Swelling and Other (See Comments)    lips  . Rifadin [Rifampin] Swelling and Other (See Comments)    lips    Patient Measurements: Height: 5\' 2"  (157.5 cm) Weight: 181 lb 12.8 oz (82.5 kg) IBW/kg (Calculated) : 50.1 Heparin Dosing Weight: 69.7  Vital Signs: Temp: 98.3 F (36.8 C) (12/27 1222) Temp Source: Oral (12/27 1222) BP: 134/55 (12/27 1222) Pulse Rate: 67 (12/27 1222)  Labs: Recent Labs    09/30/18 2206  10/01/18 0617 10/01/18 1419 10/02/18 0408 10/03/18 0541  HGB  --    < > 8.3*  --  8.7* 8.8*  HCT  --   --  27.7*  --  30.1* 31.5*  PLT  --   --  447*  --  466* 441*  APTT 38*  --   --   --   --   --   HEPARINUNFRC 0.24*  --  0.35 0.28* 0.11* 0.43  CREATININE  --   --  0.74  --  0.78 0.82   < > = values in this interval not displayed.    Estimated Creatinine Clearance: 57.2 mL/min (by C-G formula based on SCr of 0.82 mg/dL).   Medications:  Scheduled:  . amLODipine  5 mg Oral Daily  . apixaban  5 mg Oral BID  . hydrochlorothiazide  12.5 mg Oral Daily  . insulin aspart  0-9 Units Subcutaneous Q4H  . metoprolol succinate  50 mg Oral Daily  . pantoprazole (PROTONIX) IV  40 mg Intravenous Q12H  . simvastatin  20 mg Oral QPM    Assessment: Janet Williamson is a 77 year old female admitted with SOB, outpatient labs with PCP resulted in Hgb of 5. Patient was on xarelto PTA for pAF, last dose was 12/22. Pharmacy consulted to dose heparin.  S/p EGD and colonoscopy - ok to resume heparin 12/26  Heparin level therapeutic this AM, pharmacy asked to switch heparin to Eliquis this afternoon.  Patient was taking Xarelto PTA, but MD felt Eliquis may have lower risk of bleeding in this patient.  Goal of Therapy:  Heparin level 0.3-0.7  units/ml Monitor platelets by anticoagulation protocol: Yes   Plan:  Continue heparin at 1150 units / hr until 6 PM. Give dose of Eliquis 5 mg at 6 pm and then stop heparin gtt. Eliquis 5 mg BID. Patient will need Eliquis education prior to discharge.  Jenetta Downer, [facility] Clinical Pharmacist Phone [phone]  10/03/2018 4:35 PM

## 2018-10-03 NOTE — Care Management Important Message (Signed)
Important Message  Patient Details  Name: Janet Williamson MRN: [id number] Date of Birth: 1941/02/20   Medicare Important Message Given:  Yes    Oralia Rud Sharyn Brilliant 10/03/2018, 4:43 PM

## 2018-10-03 NOTE — Progress Notes (Signed)
ANTICOAGULATION CONSULT NOTE - Follow Up Consult  Pharmacy Consult for heparin Indication: atrial fibrillation  Allergies  Allergen Reactions  . Ace Inhibitors Other (See Comments)    angioedema  . Keflex [Cephalexin] Swelling and Other (See Comments)    lips  . Rifadin [Rifampin] Swelling and Other (See Comments)    lips    Patient Measurements: Height: 5\' 2"  (157.5 cm) Weight: 181 lb 12.8 oz (82.5 kg) IBW/kg (Calculated) : 50.1 Heparin Dosing Weight: 69.7  Vital Signs: Temp: 98.4 F (36.9 C) (12/27 0507) Temp Source: Oral (12/27 0507) BP: 138/63 (12/27 0507) Pulse Rate: 76 (12/27 0507)  Labs: Recent Labs    09/30/18 2206 10/01/18 0617 10/01/18 1419 10/02/18 0408 10/03/18 0541  HGB  --  8.3*  --  8.7* 8.8*  HCT  --  27.7*  --  30.1* 31.5*  PLT  --  447*  --  466* 441*  APTT 38*  --   --   --   --   HEPARINUNFRC 0.24* 0.35 0.28* 0.11* 0.43  CREATININE  --  0.74  --  0.78 0.82    Estimated Creatinine Clearance: 57.2 mL/min (by C-G formula based on SCr of 0.82 mg/dL).   Medications:  Scheduled:  . amLODipine  5 mg Oral Daily  . hydrochlorothiazide  12.5 mg Oral Daily  . insulin aspart  0-9 Units Subcutaneous Q4H  . metoprolol succinate  50 mg Oral Daily  . pantoprazole (PROTONIX) IV  40 mg Intravenous Q12H  . potassium chloride  40 mEq Oral Once  . simvastatin  20 mg Oral QPM    Assessment: Janet Williamson is a 77 year old female admitted with SOB, outpatient labs with PCP resulted in Hgb of 5. Patient was on xarelto PTA for pAF, last dose was 12/22. Pharmacy consulted to dose heparin.  S/p EGD and colonoscopy - ok to resume heparin 12/26  Heparin level therapeutic this AM  Goal of Therapy:  Heparin level 0.3-0.7 units/ml Monitor platelets by anticoagulation protocol: Yes   Plan:  Continue heparin at 1150 units / hr Daily heparin level, CBC  Thank you Okey Regal, PharmD [phone] [facility] check [email or web address] for unit-specific pharmacist phone  numbers

## 2018-10-04 LAB — TYPE AND SCREEN
ABO/RH(D): O POS
Antibody Screen: POSITIVE
Donor AG Type: NEGATIVE
Unit division: 0
Unit division: 0

## 2018-10-04 LAB — BASIC METABOLIC PANEL
Anion gap: 10 (ref 5–15)
BUN: 8 mg/dL (ref 8–23)
CO2: 26 mmol/L (ref 22–32)
Calcium: 9.6 mg/dL (ref 8.9–10.3)
Chloride: 102 mmol/L (ref 98–111)
Creatinine, Ser: 0.8 mg/dL (ref 0.44–1.00)
GFR calc Af Amer: >60 mL/min (ref >60)
GFR calc non Af Amer: >60 mL/min (ref >60)
GLUCOSE: 94 mg/dL (ref 70–99)
Potassium: 3.9 mmol/L (ref 3.5–5.1)
Sodium: 138 mmol/L (ref 135–145)

## 2018-10-04 LAB — GLUCOSE, CAPILLARY
Glucose-Capillary: 101 mg/dL — ABNORMAL HIGH (ref 70–99)
Glucose-Capillary: 107 mg/dL — ABNORMAL HIGH (ref 70–99)
Glucose-Capillary: 89 mg/dL (ref 70–99)
Glucose-Capillary: 96 mg/dL (ref 70–99)

## 2018-10-04 LAB — BPAM RBC
Blood Product Expiration Date: 201912312359
Blood Product Expiration Date: 202001112359
ISSUE DATE / TIME: 201912180816
ISSUE DATE / TIME: 201912241146
UNIT TYPE AND RH: 5100
Unit Type and Rh: 5100

## 2018-10-04 LAB — CBC
HCT: 31.6 % — ABNORMAL LOW (ref 36.0–46.0)
Hemoglobin: 8.9 g/dL — ABNORMAL LOW (ref 12.0–15.0)
MCH: 20.1 pg — ABNORMAL LOW (ref 26.0–34.0)
MCHC: 28.2 g/dL — ABNORMAL LOW (ref 30.0–36.0)
MCV: 71.5 fL — ABNORMAL LOW (ref 80.0–100.0)
Platelets: 482 10*3/uL — ABNORMAL HIGH (ref 150–400)
RBC: 4.42 MIL/uL (ref 3.87–5.11)
RDW: 28.8 % — AB (ref 11.5–15.5)
WBC: 8 10*3/uL (ref 4.0–10.5)
nRBC: 0 % (ref 0.0–0.2)

## 2018-10-04 LAB — HEPARIN LEVEL (UNFRACTIONATED): HEPARIN UNFRACTIONATED: 1.46 [IU]/mL — AB (ref 0.30–0.70)

## 2018-10-04 MED ORDER — PANTOPRAZOLE SODIUM 40 MG PO TBEC: 40.0000 mg | DELAYED_RELEASE_TABLET | Freq: Two times a day (BID) | ORAL | Status: DC

## 2018-10-04 MED ORDER — PANTOPRAZOLE SODIUM 40 MG PO TBEC: 40.0000 mg | DELAYED_RELEASE_TABLET | Freq: Two times a day (BID) | ORAL | 0 refills | Status: DC

## 2018-10-04 MED ORDER — APIXABAN 5 MG PO TABS: 5.0000 mg | ORAL_TABLET | Freq: Two times a day (BID) | ORAL | 1 refills | Status: DC

## 2018-10-04 NOTE — Progress Notes (Signed)
Patient provided with 30 day Eliquis card. Verbalized understanding for use.

## 2018-10-04 NOTE — Plan of Care (Signed)

## 2018-10-04 NOTE — Progress Notes (Signed)
Patient ready for discharge to home. Family here to provided transport. Patient displays no s/sx of distress or discomfort. All discharge information reviewed with patient. All personal belongings with pt.

## 2018-10-04 NOTE — Progress Notes (Signed)
Capsule results won't be ready until Monday, due to computer software problem.  Anticoagulation restarting.  Will follow-up with results Monday.

## 2018-10-05 NOTE — Discharge Summary (Signed)
Physician Discharge Summary  SIRA ADSIT NWG:[id number] DOB: [birth year] DOA: 09/29/2018  PCP: Maurice Small, MD  Admit date: 09/29/2018 Discharge date: 10/04/2018  [facility]  Disposition:  Home.   Recommendations for Outpatient Follow-up:  1. Follow up with PCP in 1-2 weeks 2. Please obtain BMP/CBC in one week 3. Please follow up with Dr. Dulce Sellar in 1 to 2 days regarding the capsule endoscopy results 4.  Please follow-up with cardiology as recommended.  Discharge Condition: Stable CODE STATUS: full code.  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 Patient went to see her PCP, hemoglobin was 5, baseline 8-9. Patient was sent to ED for further evaluation. FOBT positive. Per patient no prior history of GIB.  Discharge Diagnoses:  Principal Problem:   Symptomatic anemia Active Problems:   DM (diabetes mellitus) (HCC)   HTN (hypertension)   History of stroke   PAF (paroxysmal atrial fibrillation) (HCC)   S/P TAVR (transcatheter aortic valve replacement)  Acute on chronic symptomatic anemiain the setting of anticoagulation with Xarelto, GI bleed -Baseline hemoglobin 8-9, presented with hemoglobin of 5.2, status post 3 units of packed RBC transfusion  -Patient was started on IV heparin drip due to history of paroxysmal A. fib, TAVR, prior history of embolic stroke -Anemia panel with ferritin 9, percent saturation ratio 29 -GI was consulted, patient underwent endoscopy.  Colonoscopy was normal, endoscopy showed normal esophagus, nonbleeding erosive gastropathy, normal duodenal bulb, first and second portion.   -Capsule endoscopy results are pending outpatient follow-up with Dr. Dulce Sellar, hemoglobin stable 8.9.  -Restarted Eliquis  and repeat hemoglobin in the morning is 8.9. Discharge the patient on Eliquis.   DM (diabetes mellitus), type II, non-insulin-dependent, with neurological complications, history of prior stroke(HCC) -CBGs stable,  -Hemoglobin A1c 5.4  HTN (hypertension) -BP stable, continue Toprol-XL, HCTZ and amlodipine   History of prior embolic stroke in 2008 Continue with Eliquis  PAF (paroxysmal atrial fibrillation) (HCC) -Currently rate controlled,   S/P TAVR (transcatheter aortic valve replacement)in 04/2018 Was on aspirin 81 mg daily and Xarelto 20 mg daily, stop the aspirin and Xarelto and Restarted the patient on Eliquis on discharge  Hyperlipidemia Continue statin  Hypokalemia Replaced  Discharge Instructions  Discharge Instructions    Diet - low sodium heart healthy   Complete by:  As directed    Discharge instructions   Complete by:  As directed    Please follow up with Dr Dulce Sellar on Monday regarding the results of the capsule endoscopy.  Please follow up with cardiology as recommended.  Please follow up with PCP in one week.  Please check CBc in one week.     Allergies as of 10/04/2018      Reactions   Ace Inhibitors Other (See Comments)   angioedema   Keflex [cephalexin] Swelling, Other (See Comments)   lips   Rifadin [rifampin] Swelling, Other (See Comments)   lips      Medication List    STOP taking these medications   aspirin EC 81 MG tablet   rivaroxaban 20 MG Tabs tablet Commonly known as:  XARELTO     TAKE these medications   amLODipine 5 MG tablet Commonly known as:  NORVASC Take 5 mg by mouth daily.   apixaban 5 MG Tabs tablet Commonly known as:  ELIQUIS Take 1 tablet (  5 mg total) by mouth 2 (two) times daily.   glucose blood test strip Commonly known as:  ACCU-CHEK AVIVA Use as instructed to check once daily DX: E11.9   hydrochlorothiazide 25 MG tablet Commonly known as:  HYDRODIURIL Take 12.5 mg by mouth daily.    metFORMIN 750 MG 24 hr tablet Commonly known as:  GLUCOPHAGE-XR TAKE 2 TABLETS BY MOUTH ONCE DAILY WITH BREAKFAST What changed:    how much to take  how to take this  when to take this  additional instructions   metoprolol succinate 50 MG 24 hr tablet Commonly known as:  TOPROL-XL Take 50 mg by mouth daily.   pantoprazole 40 MG tablet Commonly known as:  PROTONIX Take 1 tablet (40 mg total) by mouth 2 (two) times daily.   simvastatin 20 MG tablet Commonly known as:  ZOCOR Take 20 mg by mouth every evening.   SYSTANE OP Place 1 drop into both eyes 2 (two) times daily.   Vitamin D3 125 MCG (5000 UT) Caps Take 5,000 Units by mouth daily.      Follow-up Information    Leone BrandIngold, Laura R, NP Follow up.   Specialties:  Cardiology, Radiology Why:  [facility] - 10/17/18 as below - Vernona RiegerLaura is one of the nurse practitioners who works closely with Dr. Anne FuSkains and his care team. Contact information: [address] [place] [address] [phone]        Maurice SmallGriffin, [name], MD. Schedule an appointment as soon as possible for a visit in 1 week(s).   Specialty:  Family Medicine Contact information: [address] [place] [address] [phone]        Jake BatheSkains, Mark C, MD .   Specialty:  Cardiology Contact information: [phone] [address] [place] [address] [phone]        Willis Modenautlaw, William, MD. Schedule an appointment as soon as possible for a visit on 10/06/2018.   Specialty:  Gastroenterology Contact information: [address] [place] [address] [phone]          Allergies  Allergen Reactions  . Ace Inhibitors Other (See Comments)    angioedema  . Keflex [Cephalexin] Swelling and Other (See Comments)    lips  . Rifadin [Rifampin] Swelling and Other (See Comments)    lips    Consultations: Cardiology Gastroenterology Dr. Dulce Sellarutlaw  Procedures/Studies: Dg Chest 2 View  Result Date:  09/30/2018 CLINICAL DATA:  Dyspnea on exertion. History of aortic valve replacement. EXAM: CHEST - 2 VIEW COMPARISON:  Chest radiograph April 15, 2018 FINDINGS: Cardiac silhouette is mildly enlarged. Status post aortic valve replacement. Calcified aortic arch. Pulmonary vascular congestion without pleural effusion or focal consolidation. LEFT mid lung zone scarring. No pneumothorax. Soft tissue planes and included osseous structures are non suspicious. IMPRESSION: 1. Mild cardiomegaly and pulmonary vascular congestion. 2.  Aortic Atherosclerosis (ICD10-I70.0). Electronically Signed   By: Awilda Metroourtnay  Bloomer M.D.   On: 09/30/2018 00:22       Subjective: No new complaints, looking forward to going home.   Discharge Exam: Vitals:   10/04/18 0405 10/04/18 1358  BP: 119/61 (!) 135/59  Pulse: 61 64  Resp: 20 20  Temp: 99.2 F (37.3 C) 99.2 F (37.3 C)  SpO2: 98% 98%   Vitals:   10/03/18 1222 10/03/18 2106 10/04/18 0405 10/04/18 1358  BP: (!) 134/55 120/64 119/61 (!) 135/59  Pulse: 67 65 61 64  Resp: 20 19 20 20   Temp: 98.3 F (  36.8 C) 98.4 F (36.9 C) 99.2 F (37.3 C) 99.2 F (37.3 C)  TempSrc: Oral Oral Oral Oral  SpO2: 100% 100% 98% 98%  Weight:   82.6 kg   Height:        General: Pt is alert, awake, not in acute distress Cardiovascular: RRR, S1/S2 +, no rubs, no gallops Respiratory: CTA bilaterally, no wheezing, no rhonchi Abdominal: Soft, NT, ND, bowel sounds + Extremities: no edema, no cyanosis    The results of significant diagnostics from this hospitalization (including imaging, microbiology, ancillary and laboratory) are listed below for reference.     Microbiology: No results found for this or any previous visit (from the past 240 hour(s)).   Labs: BNP (last 3 results) Recent Labs    04/11/18 1049 09/29/18 2148  BNP 74.6 154.7*   Basic Metabolic Panel: Recent Labs  Lab 09/29/18 2148 10/01/18 0617 10/02/18 0408 10/03/18 0541 10/04/18 0352  NA 137 138  137 138 138  K 3.4* 3.5 3.2* 3.4* 3.9  CL 100 101 102 103 102  CO2 26 27 26 23 26   GLUCOSE 186* 106* 96 95 94  BUN 10 6* 5* 6* 8  CREATININE 0.71 0.74 0.78 0.82 0.80  CALCIUM 9.6 9.5 9.5 9.6 9.6   Liver Function Tests: Recent Labs  Lab 09/29/18 2148  AST 18  ALT 11  ALKPHOS 54  BILITOT 0.3  PROT 8.0  ALBUMIN 3.4*   Recent Labs  Lab 09/29/18 2148  LIPASE 23   No results for input(s): AMMONIA in the last 168 hours. CBC: Recent Labs  Lab 09/29/18 2148  09/30/18 1608 10/01/18 0617 10/02/18 0408 10/03/18 0541 10/04/18 0352  WBC 12.5*  --   --  10.2 11.3* 9.3 8.0  HGB 5.2*   < > 8.5* 8.3* 8.7* 8.8* 8.9*  HCT 21.0*   < > 28.6* 27.7* 30.1* 31.5* 31.6*  MCV 64.0*  --   --  69.1* 69.5* 70.8* 71.5*  PLT 530*  --   --  447* 466* 441* 482*   < > = values in this interval not displayed.   Cardiac Enzymes: Recent Labs  Lab 09/29/18 2148  TROPONINI <0.03   BNP: Invalid input(s): POCBNP CBG: Recent Labs  Lab 10/03/18 2114 10/04/18 0037 10/04/18 0404 10/04/18 0740 10/04/18 1232  GLUCAP 118* 89 96 101* 107*   D-Dimer No results for input(s): DDIMER in the last 72 hours. Hgb A1c No results for input(s): HGBA1C in the last 72 hours. Lipid Profile No results for input(s): CHOL, HDL, LDLCALC, TRIG, CHOLHDL, LDLDIRECT in the last 72 hours. Thyroid function studies No results for input(s): TSH, T4TOTAL, T3FREE, THYROIDAB in the last 72 hours.  Invalid input(s): FREET3 Anemia work up No results for input(s): VITAMINB12, FOLATE, FERRITIN, TIBC, IRON, RETICCTPCT in the last 72 hours. Urinalysis    Component Value Date/Time   COLORURINE YELLOW 09/30/2018 0013   APPEARANCEUR CLEAR 09/30/2018 0013   LABSPEC 1.013 09/30/2018 0013   PHURINE 7.0 09/30/2018 0013   GLUCOSEU NEGATIVE 09/30/2018 0013   GLUCOSEU NEGATIVE 12/30/2014 0758   HGBUR NEGATIVE 09/30/2018 0013   BILIRUBINUR NEGATIVE 09/30/2018 0013   KETONESUR NEGATIVE 09/30/2018 0013   PROTEINUR NEGATIVE  09/30/2018 0013   UROBILINOGEN 0.2 12/30/2014 0758   NITRITE NEGATIVE 09/30/2018 0013   LEUKOCYTESUR NEGATIVE 09/30/2018 0013   Sepsis Labs Invalid input(s): PROCALCITONIN,  WBC,  LACTICIDVEN Microbiology No results found for this or any previous visit (from the past 240 hour(s)).   Time coordinating discharge: 35 minutes  SIGNED:   Kathlen Mody, MD  [facility] 10/05/2018, 10:53 AM Pager   If 7PM-7AM, please contact night-coverage [email or web address] Password [email or web address]

## 2018-10-07 ENCOUNTER — Telehealth: Payer: Self-pay

## 2018-10-07 NOTE — Telephone Encounter (Signed)
Notes recorded by Sigurd Sos, RN on 10/07/2018 at 4:10 PM EST The patient has been notified of the result and verbalized understanding. All questions (if any) were answered. Sigurd Sos, RN 10/07/2018 4:10 PM

## 2018-10-07 NOTE — Telephone Encounter (Signed)
-----   Message from Mark C Skains, MD sent at 10/07/2018  3:55 PM EST -----  No atrial fibrillation identified.  Brief episodes of supraventricular tachycardia heart rate in the 170 range, 8-13 beats in total.  Sinus rhythm. Interventricular conduction delay noted.   No changes in medication. Mark Skains, MD 

## 2018-10-07 NOTE — Telephone Encounter (Signed)
Patient contacted regarding discharge from [facility] on 10/04/18.  Patient understands to follow up with provider Nada Boozer on 10/17/18 at 12:00pm at [address]. Patient understands discharge instructions? yes Patient understands medications and regiment? yes Patient understands to bring all medications to this visit? yes

## 2018-10-07 NOTE — Telephone Encounter (Signed)
-----   Message from Jake Bathe, MD sent at 10/07/2018  3:55 PM EST -----  No atrial fibrillation identified.  Brief episodes of supraventricular tachycardia heart rate in the 170 range, 8-13 beats in total.  Sinus rhythm. Interventricular conduction delay noted.   No changes in medication. Donato Schultz, MD

## 2018-10-07 NOTE — Telephone Encounter (Signed)
Notes recorded by Sigurd Sos, RN on 10/07/2018 at 3:59 PM EST [email or web address] 12/31 ------

## 2018-10-15 ENCOUNTER — Other Ambulatory Visit: Payer: Self-pay | Admitting: Cardiology

## 2018-10-16 NOTE — Telephone Encounter (Signed)
Pt last saw Dr Anne Fu on 08/25/18, last labs 10/04/18 Creat 0.80, age 78, weight 85.6kg, CrCl 79.58, based on CrCl pt is on appropriate dosage of Xarelto 20mg  QD.  Will refill rx.

## 2018-10-17 ENCOUNTER — Encounter: Payer: Self-pay | Admitting: Cardiology

## 2018-10-17 ENCOUNTER — Ambulatory Visit (INDEPENDENT_AMBULATORY_CARE_PROVIDER_SITE_OTHER): Payer: Medicare Other | Admitting: Cardiology

## 2018-10-17 VITALS — BP 138/72 | HR 72 | Ht 64.0 in | Wt 187.8 lb

## 2018-10-17 DIAGNOSIS — Z952 Presence of prosthetic heart valve: Secondary | ICD-10-CM

## 2018-10-17 DIAGNOSIS — I480 Paroxysmal atrial fibrillation: Secondary | ICD-10-CM | POA: Diagnosis not present

## 2018-10-17 DIAGNOSIS — D649 Anemia, unspecified: Secondary | ICD-10-CM | POA: Diagnosis not present

## 2018-10-17 DIAGNOSIS — I350 Nonrheumatic aortic (valve) stenosis: Secondary | ICD-10-CM | POA: Diagnosis not present

## 2018-10-17 DIAGNOSIS — I10 Essential (primary) hypertension: Secondary | ICD-10-CM

## 2018-10-17 DIAGNOSIS — E785 Hyperlipidemia, unspecified: Secondary | ICD-10-CM

## 2018-10-17 NOTE — Progress Notes (Signed)
Cardiology Office Note   Date:  10/17/2018   ID:  Shaya, Reddick 1941-08-20, MRN [id number]  PCP:  Kelton Pillar, MD  Cardiologist:  Dr. Marlou Porch Dr. Burt Knack and Dr. Roxy Manns TAVR    Chief Complaint  Patient presents with  . Hospitalization Follow-up      History of Present Illness: Janet Williamson is a 78 y.o. female who presents for post hospitalization. 12/23-12/28 for acute GI bleed.   She has a history of embolic CVA, MAC,LBBB,HTN, HLD, DMT2, PAF on Xarelto and severe AS s/p TAVR (05/08/84) hx of embolic CVA 6314 with ?mass on mitral valve at that time, LBBB, hypertension, hyperlipidemia, diabetes, and paroxysmal atrial fibrillation on Xarelto, chronic anemia   She was seen by Dr. Cyndia Bent in 2008 when she presented with an embolic stroke and was found to have a mass on her mitral valve.It was unclear if this was a fibroblastoma or papilloma or possibly dystrophic calcification and we decided to follow it. She has been followed by Dr. Marlou Porch since that and has done well but over the past year has developed progressive shortness of breath and fatigue with activity.She had an echo on 02/26/2018 showing progression to severe aortic stenosis with a mean gradient of 47 mmHg and a peak gradient of 71 mmHg. The dimensionless index was 0.34. Left ventricular ejection fraction was 55 to 60%.She underwent cardiac catheterization on 03/06/2018 which showed no significant coronary disease. The peak to peak gradient across the aortic valve was 32 mmHg. She was evaluated by Dr. Burt Knack and a repeat TEE was performed to evaluate the mitral valve given her history of a mitral valve mass. This showed no evidence of mitral valve mass or vegetation. There is mild regurgitation. The leaflets were mildly thickened with mild mitral annular calcification. The aortic valve was severely calcified and restricted with a mean gradient of 36 mmHg.   She underwentsuccessful TAVR with a25m Edwards  Sapien 3 THV via the TF approach on 04/15/18. Post operative echo showed higher than expected transvalvular aortic gradients likely related to patient-prosthesis mismatch with a mean gradient of 22 mmHgwithno paravalvular regurgitation. She was discharged the following day on Asprin 81 mg daily and home Xarelto .   She had been doing well until prior to hospitalization and was seen by PCP for SOB and labs done with acute anemia.  [facility] hgb 5.2 rec'd 3 units PRBCs colonoscopy was normal.  EGD -non bleeding erosive gastropathy.  --capsule endoscopy with duodenal ulcer.  Protonix BID, and recommendation on anticoagulation to be on PPI.   Eliquis added at discharge but this was changed to Xarelto.  Her ASA was stopped and per Dr. SKingsley Plannote she could come off of ASA in Jan.   No chest pain and no SOB.  She is feeling back to baseline post TAVR baseline which was improved. She is having arthritic knee pain.  Cannot really exercise.  Will check labs today.     Past Medical History:  Diagnosis Date  . Anemia   . Cardiac mass    a. 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 Not seen on most recent TEE 2019.  . Cardiomyopathy in other disease   . Cataracts, bilateral   . Diabetes mellitus    Type 2  . Generalized osteoarthritis   . GERD (gastroesophageal reflux disease)   . HLD (hyperlipidemia)   . Hypertension   . Hypothyroidism   . LBBB (left bundle branch block)   .  PAF (paroxysmal atrial fibrillation) ([place])    a. s/p DCCV 06/2017, on Xarelto  . Phlebitis   . S/P TAVR (transcatheter aortic valve replacement) 04/15/2018   Edwards Sapien 3 THV (size 23 mm, model # 9600TFX, serial # [id number]) via the TF approach  . Severe aortic stenosis    a. s/p TAVR 04/2018.  . Stroke [facility]) 2008    Past Surgical History:  Procedure Laterality Date  . ABDOMINAL HYSTERECTOMY    . COLONOSCOPY    . COLONOSCOPY WITH PROPOFOL N/A 10/02/2018   Procedure: COLONOSCOPY WITH  PROPOFOL;  Surgeon: Arta Silence, MD;  Location: [place];  Service: Endoscopy;  Laterality: N/A;  . ESOPHAGOGASTRODUODENOSCOPY (EGD) WITH PROPOFOL N/A 10/02/2018   Procedure: ESOPHAGOGASTRODUODENOSCOPY (EGD) WITH PROPOFOL;  Surgeon: Arta Silence, MD;  Location: [place];  Service: Endoscopy;  Laterality: N/A;  . EYE SURGERY Bilateral    cataract removal  . GIVENS CAPSULE STUDY N/A 10/02/2018   Procedure: GIVENS CAPSULE STUDY;  Surgeon: Arta Silence, MD;  Location: [facility] ENDOSCOPY;  Service: Endoscopy;  Laterality: N/A;  . RIGHT/LEFT HEART CATH AND CORONARY ANGIOGRAPHY N/A 03/06/2018   Procedure: RIGHT/LEFT HEART CATH AND CORONARY ANGIOGRAPHY;  Surgeon: Belva Crome, MD;  Location: [place] CV LAB;  Service: Cardiovascular;  Laterality: N/A;  . TEE WITHOUT CARDIOVERSION N/A 04/01/2018   Procedure: TRANSESOPHAGEAL ECHOCARDIOGRAM (TEE);  Surgeon: Jerline Pain, MD;  Location: [facility] ENDOSCOPY;  Service: Cardiovascular;  Laterality: N/A;  . TEE WITHOUT CARDIOVERSION N/A 04/15/2018   Procedure: TRANSESOPHAGEAL ECHOCARDIOGRAM (TEE);  Surgeon: Sherren Mocha, MD;  Location: [place];  Service: Open Heart Surgery;  Laterality: N/A;  . TOTAL KNEE ARTHROPLASTY Right 04/14/2013   Dr Marlou Sa  . TOTAL KNEE ARTHROPLASTY Right 04/14/2013   Procedure: TOTAL KNEE ARTHROPLASTY;  Surgeon: Meredith Pel, MD;  Location: [place];  Service: Orthopedics;  Laterality: Right;  . TRANSCATHETER AORTIC VALVE REPLACEMENT, TRANSFEMORAL  04/15/2018  . TRANSCATHETER AORTIC VALVE REPLACEMENT, TRANSFEMORAL Bilateral 04/15/2018   Procedure: TRANSCATHETER AORTIC VALVE REPLACEMENT, TRANSFEMORAL;  Surgeon: Sherren Mocha, MD;  Location: [place];  Service: Open Heart Surgery;  Laterality: Bilateral;     Current Outpatient Medications  Medication Sig Dispense Refill  . amLODipine (NORVASC) 5 MG tablet Take 5 mg by mouth daily.    . Cholecalciferol (VITAMIN D3) 5000 units CAPS Take 5,000 Units by mouth daily.    Marland Kitchen glucose  blood (ACCU-CHEK AVIVA) test strip Use as instructed to check once daily DX: E11.9 100 each 3  . hydrochlorothiazide (HYDRODIURIL) 25 MG tablet Take 12.5 mg by mouth daily.     . metFORMIN (GLUCOPHAGE-XR) 750 MG 24 hr tablet TAKE 2 TABLETS BY MOUTH ONCE DAILY WITH BREAKFAST (Patient taking differently: Take 1,500 mg by mouth daily with breakfast. ) 180 tablet 0  . metoprolol succinate (TOPROL-XL) 50 MG 24 hr tablet Take 50 mg by mouth daily.     . pantoprazole (PROTONIX) 40 MG tablet Take 1 tablet (40 mg total) by mouth 2 (two) times daily. 60 tablet 0  . Polyethyl Glycol-Propyl Glycol (SYSTANE OP) Place 1 drop into both eyes 2 (two) times daily.     . simvastatin (ZOCOR) 20 MG tablet Take 20 mg by mouth every evening.    Alveda Reasons 20 MG TABS tablet TAKE 1 TABLET BY MOUTH ONCE DAILY WITH SUPPER 30 tablet 6   No current facility-administered medications for this visit.     Allergies:   Ace inhibitors; Keflex [cephalexin]; and Rifadin [rifampin]    Social History:  The patient  reports that she has never smoked. She has never used smokeless tobacco. She reports that she does not drink alcohol or use drugs.   Family History:  The patient's family history includes Hypertension in her father and mother; Stroke in her mother.    ROS:  General:no colds or fevers, no weight changes Skin:no rashes or ulcers HEENT:no blurred vision, no congestion CV:see HPI PUL:see HPI GI:no diarrhea constipation or melena, no indigestion GU:no hematuria, no dysuria MS:+ joint pain, no claudication Neuro:no syncope, no lightheadedness Endo:+ diabetes followed by Dr. Dwyane Dee, no thyroid disease  Wt Readings from Last 3 Encounters:  10/17/18 187 lb 12.8 oz (85.2 kg)  10/04/18 182 lb 1.6 oz (82.6 kg)  08/25/18 188 lb 12.8 oz (85.6 kg)     PHYSICAL EXAM: VS:  BP 138/72   Pulse 72   Ht 5' 4"  (1.626 m)   Wt 187 lb 12.8 oz (85.2 kg)   SpO2 99%   BMI 32.24 kg/m  , BMI Body mass index is 32.24  kg/m. General:Pleasant affect, NAD Skin:Warm and dry, brisk capillary refill HEENT:normocephalic, sclera clear, mucus membranes moist Neck:supple, no JVD, no bruits  Heart:S1S2 RRR with 3-7/9 systolic murmur, no gallup, rub or click Lungs:clear without rales, rhonchi, or wheezes KWI:OXBD, non tender, + BS, do not palpate liver spleen or masses Ext:no lower ext edema, 2+ pedal pulses, 2+ radial pulses Neuro:alert and oriented X 3, MAE, follows commands, + facial symmetry    EKG:  EKG is NOT ordered today. EKGs from the hospital reviewed and all SR and no acute changes   Recent Labs: 04/16/2018: Magnesium 1.7 04/24/2018: TSH 1.09 09/29/2018: ALT 11; B Natriuretic Peptide 154.7 10/04/2018: BUN 8; Creatinine, Ser 0.80; Hemoglobin 8.9; Platelets 482; Potassium 3.9; Sodium 138    Lipid Panel    Component Value Date/Time   CHOL 121 04/23/2017 1022   TRIG 84.0 04/23/2017 1022   HDL 38.10 (L) 04/23/2017 1022   CHOLHDL 3 04/23/2017 1022   VLDL 16.8 04/23/2017 1022   LDLCALC 66 04/23/2017 1022       Other studies Reviewed: Additional studies/ records that were reviewed today include:  Echo 05/21/18 . Study Conclusions  - Left ventricle: The cavity size was normal. There was severe   focal basal hypertrophy of the septum. Systolic function was   normal. The estimated ejection fraction was in the range of 55%   to 60%. Wall motion was normal; there were no regional wall   motion abnormalities. Doppler parameters are consistent with   abnormal left ventricular relaxation (grade 1 diastolic   dysfunction). - Aortic valve: TAVR with a 23 mm Edwards Sapien 3 THV via the TF   approach on 04/15/18. Peak velocity (S): 324 cm/s. Mean gradient   (S): 24 mm Hg. Valve area (Vmax): 1.11 cm^2. - Mitral valve: Moderately calcified annulus. - Left atrium: The atrium was mildly dilated. - Tricuspid valve: There was moderate regurgitation. - Pulmonic valve: There was moderate regurgitation. -  Pulmonary arteries: Systolic pressure was mildly increased. PA   peak pressure: 43 mm Hg (S).  Holter  Notes recorded by Jerline Pain, MD on 10/07/2018 at 3:55 PM EST  No atrial fibrillation identified.  Brief episodes of supraventricular tachycardia heart rate in the 170 range, 8-13 beats in total.  Sinus rhythm. Interventricular conduction delay noted.  No changes in medication.  Cardiac cath 03/06/18  Calcific aortic stenosis with peak to peak gradient of 32 mmHg.  Valve is heavily calcified by cine fluoroscopy.  Calculated aortic valve area 1.62 cm related to a calculated/estimated abnormally high cardiac output of 7.8 L/min  Technically difficult coronary angiography from the right radial due to heavy calcification in the iliac/subclavian preventing accurate catheter control.  Normal left main  Large widely patent LAD without obstructive disease  Large widely patent circumflex without obstructive disease  RCA never selectively engaged but visualized in the proximal mid and distal vessel is widely patent.  No evidence of pulmonary hypertension   RECOMMENDATIONS:   There is discordance between echo and hemodynamic data with reference to severity of aortic valve disease.  There is no other explanation for the patient's progressive dyspnea.  I would recommend referring the patient to the aortic valve clinic to determine if proceeding with valve therapy is reasonable.    ASSESSMENT AND PLAN:  1.  Recent GI bleed due to duodenal ulcer.  On protonix BID and asa was stopped, which could be stopped in Jan anyway.  Initially changed to eliquis but with results of capsule endoscopy she was put back on xarelto.  Will recheck CBC today for acute blood loss anemia.  ASA D/c'd  2.   PAF  Maintaining SR and on xarelto.    3.  TAVR  Stable still with murmur.   with mean gradient of 24 mmHg elevated from patient prosthetic mismatch.  ASA stopped  4.  HLD on zocor wil recheck  non fasting today.    5.  HTN controlled no change in meds  6.  Hx of transient LBBB.   Follow up with Truitt Merle in May and Dr. Marlou Porch in Nov.  Current medicines are reviewed with the patient today.  The patient Has no concerns regarding medicines.  The following changes have been made:  See above Labs/ tests ordered today include:see above  Disposition:   FU:  see above  Signed, Cecilie Kicks, NP  10/17/2018 12:22 PM    [facility] [place], [place], [place] Phone: [phone]; Fax: [phone]

## 2018-10-17 NOTE — Patient Instructions (Signed)
Your physician recommends that you continue on your current medications as directed. Please refer to the Current Medication list given to you today.   Your physician recommends that you return for lab work in:  TODAY BMET CMP AND LIPID  Your physician recommends that you schedule a follow-up appointment in:  MAY WITH Norma Fredrickson NP

## 2018-10-20 LAB — COMPREHENSIVE METABOLIC PANEL
ALT: 12 IU/L (ref 0–32)
AST: 16 IU/L (ref 0–40)
Albumin/Globulin Ratio: 1.2 (ref 1.2–2.2)
Albumin: 4.2 g/dL (ref 3.5–4.8)
Alkaline Phosphatase: 64 IU/L (ref 39–117)
BUN/Creatinine Ratio: 14 (ref 12–28)
BUN: 11 mg/dL (ref 8–27)
Bilirubin Total: 0.3 mg/dL (ref 0.0–1.2)
CHLORIDE: 99 mmol/L (ref 96–106)
CO2: 22 mmol/L (ref 20–29)
Calcium: 10.5 mg/dL — ABNORMAL HIGH (ref 8.7–10.3)
Creatinine, Ser: 0.79 mg/dL (ref 0.57–1.00)
GFR calc Af Amer: 84 mL/min/{1.73_m2} (ref >59)
GFR calc non Af Amer: 72 mL/min/{1.73_m2} (ref >59)
Globulin, Total: 3.5 g/dL (ref 1.5–4.5)
Glucose: 94 mg/dL (ref 65–99)
Potassium: 4.3 mmol/L (ref 3.5–5.2)
Sodium: 141 mmol/L (ref 134–144)
Total Protein: 7.7 g/dL (ref 6.0–8.5)

## 2018-10-20 LAB — CBC
Hematocrit: 33.2 % — ABNORMAL LOW (ref 34.0–46.6)
Hemoglobin: 9.6 g/dL — ABNORMAL LOW (ref 11.1–15.9)
MCH: 20.3 pg — ABNORMAL LOW (ref 26.6–33.0)
MCHC: 28.9 g/dL — ABNORMAL LOW (ref 31.5–35.7)
MCV: 70 fL — ABNORMAL LOW (ref 79–97)
Platelets: 547 10*3/uL — ABNORMAL HIGH (ref 150–450)
RBC: 4.72 x10E6/uL (ref 3.77–5.28)
RDW: 26.8 % — ABNORMAL HIGH (ref 11.7–15.4)
WBC: 9 10*3/uL (ref 3.4–10.8)

## 2018-10-20 LAB — LIPID PANEL
CHOLESTEROL TOTAL: 145 mg/dL (ref 100–199)
Chol/HDL Ratio: 3.1 ratio (ref 0.0–4.4)
HDL: 47 mg/dL (ref >39)
LDL Calculated: 80 mg/dL (ref 0–99)
TRIGLYCERIDES: 92 mg/dL (ref 0–149)
VLDL Cholesterol Cal: 18 mg/dL (ref 5–40)

## 2018-10-27 ENCOUNTER — Other Ambulatory Visit (INDEPENDENT_AMBULATORY_CARE_PROVIDER_SITE_OTHER): Payer: Medicare Other

## 2018-10-27 DIAGNOSIS — E119 Type 2 diabetes mellitus without complications: Secondary | ICD-10-CM | POA: Diagnosis not present

## 2018-10-27 LAB — BASIC METABOLIC PANEL
BUN: 13 mg/dL (ref 6–23)
CO2: 30 mEq/L (ref 19–32)
CREATININE: 0.86 mg/dL (ref 0.40–1.20)
Calcium: 10.1 mg/dL (ref 8.4–10.5)
Chloride: 101 mEq/L (ref 96–112)
GFR: 77.24 mL/min (ref >60.00)
Glucose, Bld: 133 mg/dL — ABNORMAL HIGH (ref 70–99)
Potassium: 3.7 mEq/L (ref 3.5–5.1)
Sodium: 138 mEq/L (ref 135–145)

## 2018-10-27 LAB — LIPID PANEL
Cholesterol: 133 mg/dL (ref 0–200)
HDL: 39.6 mg/dL (ref >39.00)
LDL Cholesterol: 79 mg/dL (ref 0–99)
NonHDL: 93.28
Total CHOL/HDL Ratio: 3
Triglycerides: 73 mg/dL (ref 0.0–149.0)
VLDL: 14.6 mg/dL (ref 0.0–40.0)

## 2018-10-27 LAB — HEMOGLOBIN A1C: HEMOGLOBIN A1C: 5.6 % (ref 4.6–6.5)

## 2018-10-30 ENCOUNTER — Encounter: Payer: Self-pay | Admitting: Endocrinology

## 2018-10-30 ENCOUNTER — Ambulatory Visit (INDEPENDENT_AMBULATORY_CARE_PROVIDER_SITE_OTHER): Payer: Medicare Other | Admitting: Endocrinology

## 2018-10-30 VITALS — BP 140/80 | HR 82 | Ht 64.0 in | Wt 190.4 lb

## 2018-10-30 DIAGNOSIS — E042 Nontoxic multinodular goiter: Secondary | ICD-10-CM

## 2018-10-30 DIAGNOSIS — E119 Type 2 diabetes mellitus without complications: Secondary | ICD-10-CM

## 2018-10-30 LAB — URINALYSIS, ROUTINE W REFLEX MICROSCOPIC
Bilirubin Urine: NEGATIVE
Hgb urine dipstick: NEGATIVE
Ketones, ur: NEGATIVE
LEUKOCYTES UA: NEGATIVE
Nitrite: NEGATIVE
Specific Gravity, Urine: 1.02 (ref 1.000–1.030)
TOTAL PROTEIN, URINE-UPE24: NEGATIVE
UROBILINOGEN UA: 0.2 (ref 0.0–1.0)
Urine Glucose: NEGATIVE
pH: 5.5 (ref 5.0–8.0)

## 2018-10-30 LAB — MICROALBUMIN / CREATININE URINE RATIO
Creatinine,U: 148.1 mg/dL
Microalb Creat Ratio: 0.6 mg/g (ref 0.0–30.0)
Microalb, Ur: 0.9 mg/dL (ref 0.0–1.9)

## 2018-10-30 NOTE — Progress Notes (Signed)
Patient ID: Janet Williamson, female   DOB: Apr 20, 1941, 78 y.o.   MRN: [id number]   Reason for Appointment: Diabetes follow-up   History of Present Illness   Type 2 DIABETES MELITUS, date of diagnosis 02/2012     She has had mild diabetes which has been well controlled with metformin ER alone. Has had no side effects from metformin  She also has had diabetes education in 2013 . Oral hypoglycemic drugs: Metformin 1.5 g        Side effects from medications: None  Her A1c which had been 6.9 is now 5.6   Current management, blood sugar patterns and problems identified:  She recently has had increased anemia which may be affecting her A1c   Checking her blood sugars mostly in the afternoons between 2 and 6 PM and not clear which readings are after meals  She is trying to walk although may not have done as much last night because of her acute illness  Overall blood sugars are fairly good but she has sporadic high readings and she thinks this may be from drinking sweet drinks at times  She is not particular about planning her meals and still not losing weight  Lab blood sugars in the mornings are mildly increased, last 133 but as low as 94  No side effects from metformin  No side effects with her metformin ER 750 mg, 2 tablets daily  Monitors blood glucose:  less than once a day     Glucometer:  Accu-Chek  Blood Glucose readings from monitor download show recent range 102-239 with only 2 readings over 180 Overall average 143      Previous visits with dietitian: Several years ago  Dinner At 5-6 pm       Physical activity: exercise: walking some  Wt Readings from Last 3 Encounters:  10/30/18 190 lb 6.4 oz (86.4 kg)  10/17/18 187 lb 12.8 oz (85.2 kg)  10/04/18 182 lb 1.6 oz (82.6 kg)     Lab Results  Component Value Date   HGBA1C 5.6 10/27/2018   HGBA1C 5.4 10/01/2018   HGBA1C 6.9 (H) 04/24/2018   Lab Results  Component Value Date   MICROALBUR <0.7  04/23/2017   LDLCALC 79 10/27/2018   CREATININE 0.86 10/27/2018     Other active problems evaluated today: See review of systems   LABS:  Lab on 10/27/2018  Component Date Value Ref Range Status  . Cholesterol 10/27/2018 133  0 - 200 mg/dL Final   ATP III Classification       Desirable:  < 200 mg/dL               Borderline High:  200 - 239 mg/dL          High:  > = 301 mg/dL  . Triglycerides 10/27/2018 73.0  0.0 - 149.0 mg/dL Final   Normal:  <601 mg/dLBorderline High:  150 - 199 mg/dL  . HDL 10/27/2018 39.60  >39.00 mg/dL Final  . VLDL 09/32/3557 14.6  0.0 - 40.0 mg/dL Final  . LDL Cholesterol 10/27/2018 79  0 - 99 mg/dL Final  . Total CHOL/HDL Ratio 10/27/2018 3   Final                  Men          Women1/2 Average Risk     3.4          3.3Average Risk  5.0          4.42X Average Risk          9.6          7.13X Average Risk          15.0          11.0                      . NonHDL 10/27/2018 93.28   Final   NOTE:  Non-HDL goal should be 30 mg/dL higher than patient's LDL goal (i.e. LDL goal of < 70 mg/dL, would have non-HDL goal of < 100 mg/dL)  . Sodium 10/27/2018 138  135 - 145 mEq/L Final  . Potassium 10/27/2018 3.7  3.5 - 5.1 mEq/L Final  . Chloride 10/27/2018 101  96 - 112 mEq/L Final  . CO2 10/27/2018 30  19 - 32 mEq/L Final  . Glucose, Bld 10/27/2018 133* 70 - 99 mg/dL Final  . BUN 65/78/469601/20/2020 13  6 - 23 mg/dL Final  . Creatinine, Ser 10/27/2018 0.86  0.40 - 1.20 mg/dL Final  . Calcium 29/52/841301/20/2020 10.1  8.4 - 10.5 mg/dL Final  . GFR 24/40/102701/20/2020 77.24  >60.00 mL/min Final  . Hgb A1c MFr Bld 10/27/2018 5.6  4.6 - 6.5 % Final   Glycemic Control Guidelines for People with Diabetes:Non Diabetic:  <6%Goal of Therapy: <7%Additional Action Suggested:  >8%     Allergies as of 10/30/2018      Reactions   Ace Inhibitors Other (See Comments)   angioedema   Keflex [cephalexin] Swelling, Other (See Comments)   lips   Rifadin [rifampin] Swelling, Other (See Comments)    lips      Medication List       Accurate as of October 30, 2018  9:18 AM. Always use your most recent med list.        amLODipine 5 MG tablet Commonly known as:  NORVASC Take 5 mg by mouth daily.   glucose blood test strip Commonly known as:  ACCU-CHEK AVIVA Use as instructed to check once daily DX: E11.9   hydrochlorothiazide 25 MG tablet Commonly known as:  HYDRODIURIL Take 12.5 mg by mouth daily.   metFORMIN 750 MG 24 hr tablet Commonly known as:  GLUCOPHAGE-XR TAKE 2 TABLETS BY MOUTH ONCE DAILY WITH BREAKFAST   metoprolol succinate 50 MG 24 hr tablet Commonly known as:  TOPROL-XL Take 50 mg by mouth daily.   pantoprazole 40 MG tablet Commonly known as:  PROTONIX Take 1 tablet (40 mg total) by mouth 2 (two) times daily.   simvastatin 20 MG tablet Commonly known as:  ZOCOR Take 20 mg by mouth every evening.   SYSTANE OP Place 1 drop into both eyes 2 (two) times daily.   Vitamin D3 125 MCG (5000 UT) Caps Take 5,000 Units by mouth daily.   XARELTO 20 MG Tabs tablet Generic drug:  rivaroxaban TAKE 1 TABLET BY MOUTH ONCE DAILY WITH SUPPER       Allergies:  Allergies  Allergen Reactions  . Ace Inhibitors Other (See Comments)    angioedema  . Keflex [Cephalexin] Swelling and Other (See Comments)    lips  . Rifadin [Rifampin] Swelling and Other (See Comments)    lips    Past Medical History:  Diagnosis Date  . Anemia   . Cardiac mass    a. 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 Not seen on most recent TEE 2019.  . Cardiomyopathy in other disease   .  Cataracts, bilateral   . Diabetes mellitus    Type 2  . Generalized osteoarthritis   . GERD (gastroesophageal reflux disease)   . HLD (hyperlipidemia)   . Hypertension   . Hypothyroidism   . LBBB (left bundle branch block)   . PAF (paroxysmal atrial fibrillation) ([facility])    a. s/p DCCV 06/2017, on Xarelto  . Phlebitis   . S/P TAVR (transcatheter aortic valve replacement) 04/15/2018   Edwards  Sapien 3 THV (size 23 mm, model # 9600TFX, serial # [id number]) via the TF approach  . Severe aortic stenosis    a. s/p TAVR 04/2018.  . Stroke [facility]) 2008    Past Surgical History:  Procedure Laterality Date  . ABDOMINAL HYSTERECTOMY    . COLONOSCOPY    . COLONOSCOPY WITH PROPOFOL N/A 10/02/2018   Procedure: COLONOSCOPY WITH PROPOFOL;  Surgeon: [name], MD;  Location: [facility] ENDOSCOPY;  Service: Endoscopy;  Laterality: N/A;  . ESOPHAGOGASTRODUODENOSCOPY (EGD) WITH PROPOFOL N/A 10/02/2018   Procedure: ESOPHAGOGASTRODUODENOSCOPY (EGD) WITH PROPOFOL;  Surgeon: [name], MD;  Location: [facility] ENDOSCOPY;  Service: Endoscopy;  Laterality: N/A;  . EYE SURGERY Bilateral    cataract removal  . GIVENS CAPSULE STUDY N/A 10/02/2018   Procedure: GIVENS CAPSULE STUDY;  Surgeon: [name], MD;  Location: [facility] ENDOSCOPY;  Service: Endoscopy;  Laterality: N/A;  . RIGHT/LEFT HEART CATH AND CORONARY ANGIOGRAPHY N/A 03/06/2018   Procedure: RIGHT/LEFT HEART CATH AND CORONARY ANGIOGRAPHY;  Surgeon: Lyn Records, MD;  Location: [facility] INVASIVE CV LAB;  Service: Cardiovascular;  Laterality: N/A;  . TEE WITHOUT CARDIOVERSION N/A 04/01/2018   Procedure: TRANSESOPHAGEAL ECHOCARDIOGRAM (TEE);  Surgeon: Jake Bathe, MD;  Location: [facility] ENDOSCOPY;  Service: Cardiovascular;  Laterality: N/A;  . TEE WITHOUT CARDIOVERSION N/A 04/15/2018   Procedure: TRANSESOPHAGEAL ECHOCARDIOGRAM (TEE);  Surgeon: Tonny Bollman, MD;  Location: [facility] OR;  Service: Open Heart Surgery;  Laterality: N/A;  . TOTAL KNEE ARTHROPLASTY Right 04/14/2013   Dr August Saucer  . TOTAL KNEE ARTHROPLASTY Right 04/14/2013   Procedure: TOTAL KNEE ARTHROPLASTY;  Surgeon: Cammy Copa, MD;  Location: [facility] OR;  Service: Orthopedics;  Laterality: Right;  . TRANSCATHETER AORTIC VALVE REPLACEMENT, TRANSFEMORAL  04/15/2018  . TRANSCATHETER AORTIC VALVE REPLACEMENT, TRANSFEMORAL Bilateral 04/15/2018   Procedure: TRANSCATHETER AORTIC VALVE REPLACEMENT, TRANSFEMORAL;   Surgeon: Tonny Bollman, MD;  Location: [facility] OR;  Service: Open Heart Surgery;  Laterality: Bilateral;    Family History  Problem Relation Age of Onset  . Stroke Mother   . Hypertension Mother   . Hypertension Father   . Heart attack Neg Hx     Social History:  reports that she has never smoked. She has never used smokeless tobacco. She reports that she does not drink alcohol or use drugs.  Review of Systems:  HYPERTENSION:  she has been managed with 5 mg amlodipine, 50 mg metoprolol and hydrochlorothiazide as before, followed by PCP.  She had swelling of the lips with ramipril and has not been prescribed an ARB   HYPERLIPIDEMIA: The lipid abnormality consists of elevated LDL treated with simvastatin 20 mg by PCP and LDL is controlled as follows   Lab Results  Component Value Date   CHOL 133 10/27/2018   HDL 39.60 10/27/2018   LDLCALC 79 10/27/2018   TRIG 73.0 10/27/2018   CHOLHDL 3 10/27/2018    Multinodular goiter:  She has had a long-standing multinodular goiter, last ultrasound was done in 05/2012 which showed the largest nodule to be 4.1 cm nodule in the isthmus Her  exam has been fairly stable  TSH has been normal  Lab Results  Component Value Date   TSH 1.09 04/24/2018    No history of numbness or tingling in her feet Foot exam done in 7/19 by her PCP  Last eye exam was in 07/2016 and she is due for follow-up soon  HYPERCALCEMIA: Etiology unclear, has had only one recent high calcium She does take 25 mg HCTZ PTH normal Not on calcium supplements   Lab Results  Component Value Date   CALCIUM 10.1 10/27/2018   CALCIUM 10.5 (H) 10/17/2018   CALCIUM 9.6 10/04/2018   CALCIUM 9.6 10/03/2018   CALCIUM 9.5 10/02/2018    Lab Results  Component Value Date   PTH 23 08/22/2017   CALCIUM 10.1 10/27/2018   CAION 1.14 (L) 04/15/2018     Examination: BP 140/80 (BP Location: Left Arm, Patient Position: Sitting, Cuff Size: Normal)   Pulse 82   Ht 5\' 4"  (1.626  m)   Wt 190 lb 6.4 oz (86.4 kg)   SpO2 98%   BMI 32.68 kg/m   Body mass index is 32.68 kg/m.   There is a 3 cm nodule on the left of the isthmus which is smooth and slightly firm The right lobe is palpable 2-1/2 times normal, nodular and firm and the left side is not clearly palpable No pedal edema   ASSESSMENT/ PLAN:   Diabetes type 2 with obesity See history of present illness for discussion of current diabetes management, blood sugar patterns and problems identified  Her A1c is relatively low below 6, likely to be from recent anemia blood loss  Although her blood sugars are variable and occasionally high from not watching her carbohydrate intake overall control is still fairly good with metformin alone As before she is not consistent with planning her meals are making efforts to lose weight She is trying to do a little walking as tolerated We will continue her 1500 mg metformin again and follow-up in 6 months  Urine microalbumin to be checked today  She is going to get her follow-up eye exam and will try to get her reports sent here  GOITER: Stable with previously normal TSH  HYPERTENSION: Well controlled    Reather Littler 10/30/2018, 9:18 AM   Note: This office note was prepared with Dragon voice recognition system technology. Any transcriptional errors that result from this process are unintentional.

## 2019-01-26 ENCOUNTER — Other Ambulatory Visit: Payer: Self-pay | Admitting: Endocrinology

## 2019-02-18 ENCOUNTER — Other Ambulatory Visit: Payer: Self-pay | Admitting: Physician Assistant

## 2019-02-18 DIAGNOSIS — Z952 Presence of prosthetic heart valve: Secondary | ICD-10-CM

## 2019-02-23 ENCOUNTER — Ambulatory Visit: Payer: Medicare Other | Admitting: Nurse Practitioner

## 2019-03-03 ENCOUNTER — Telehealth: Payer: Medicare Other | Admitting: Nurse Practitioner

## 2019-03-09 ENCOUNTER — Telehealth (HOSPITAL_COMMUNITY): Payer: Self-pay

## 2019-03-09 NOTE — Telephone Encounter (Signed)

## 2019-03-10 ENCOUNTER — Other Ambulatory Visit: Payer: Self-pay

## 2019-03-10 ENCOUNTER — Ambulatory Visit (HOSPITAL_COMMUNITY): Payer: Medicare Other | Attending: Cardiology

## 2019-03-10 DIAGNOSIS — Z952 Presence of prosthetic heart valve: Secondary | ICD-10-CM

## 2019-03-10 NOTE — Progress Notes (Signed)
[facility]   MULTIDISCIPLINARY HEART VALVE TEAM     Virtual Visit via Telephone Note   This visit type was conducted due to national recommendations for restrictions regarding the COVID-19 Pandemic (e.g. social distancing) in an effort to limit this patient's exposure and mitigate transmission in our community.  Due to her co-morbid illnesses, this patient is at least at moderate risk for complications without adequate follow up.  This format is felt to be most appropriate for this patient at this time.  The patient did not have access to video technology/had technical difficulties with video requiring transitioning to audio format only (telephone).  All issues noted in this document were discussed and addressed.  No physical exam could be performed with this format.  Please refer to the patient's chart for her  consent to telehealth for [facility].   Evaluation Performed:  Follow-up visit  Date:  03/11/2019   ID:  Janet, Williamson July 01, 1941, MRN [id number]  Patient Location: Home Provider Location: Office  PCP:  Kelton Pillar, MD  Cardiologist:  [name], MD / Dr. Burt Knack &Dr. Roxy Manns (TAVR)  Chief Complaint:  1 year s/p TAVR  History of Present Illness:    Janet Williamson is a 78 y.o. female with a history of embolic CVA, MAC,LBBB,HTN, HLD, DMT2, PAF on Xarelto and severe AS s/p TAVR (04/15/18) who presents for follow up.   The patient does not have symptoms concerning for COVID-19 infection (fever, chills, cough, or new shortness of breath).   She was seen by Dr. Cyndia Bent in 2008 when she presented with an embolic stroke and was found to have a mass on her mitral valve.It was unclear if this was a fibroblastoma or papilloma or possibly dystrophic calcification and we decided to follow it. She has been followed by Dr. Marlou Porch since that and has done well but over the past year has developed progressive shortness of breath and fatigue with activity.She had an  echo on 02/26/2018 showing progression to severe aortic stenosis with a mean gradient of 47 mmHg and a peak gradient of 71 mmHg. The dimensionless index was 0.34. Left ventricular ejection fraction was 55 to 60%.She underwent cardiac catheterization on 03/06/2018 which showed no significant coronary disease. The peak to peak gradient across the aortic valve was 32 mmHg. She was evaluated by Dr. Burt Knack and a repeat TEE was performed to evaluate the mitral valve given her history of a mitral valve mass. This showed no evidence of mitral valve mass or vegetation. There is mild regurgitation. The leaflets were mildly thickened with mild mitral annular calcification. The aortic valve was severely calcified and restricted with a mean gradient of 36 mmHg.   She underwentsuccessful TAVR with a80m Edwards Sapien 3 THV via the TF approach on 04/15/18. Post operative echo showed higher than expected transvalvular aortic gradients likely related to patient-prosthesis mismatch with a mean gradient of 22 mmHgwithno paravalvular regurgitation. She was discharged the following day on Asprin 81 mg daily and home Xarelto . 1 month echo showed EF 55% with normally functioning TAVR with no PVL and mean gradient 242mHg.  She was admitted in 09/2018 for GI bleed. Hg 5.2 and she was transfused. Colonoscopy was normal. EGD showed non bleeding erosive gastropathy. Capsule endoscopy showed duodenal ulcer. She was started on a PPI and aspirin discontinued. Follow up CBC showed Hg 9.6.  Today she presents for follow via telemedicine. She has been having dizzy spells recently. Spells are very short lived lasting  only seconds. No syncope. No weakness or fatigue. No CP or SOB. No LE edema, orthopnea or PND. No blood in stool or urine. No palpitations. Most activity she gets is walking around the house due to knee pain.    Past Medical History:  Diagnosis Date   Anemia    Cardiac mass    a. 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 Not seen on most recent TEE 2019.   Cardiomyopathy in other disease    Cataracts, bilateral    Diabetes mellitus    Type 2   Generalized osteoarthritis    GERD (gastroesophageal reflux disease)    HLD (hyperlipidemia)    Hypertension    Hypothyroidism    LBBB (left bundle branch block)    PAF (paroxysmal atrial fibrillation) ([place])    a. s/p DCCV 06/2017, on Xarelto   Phlebitis    S/P TAVR (transcatheter aortic valve replacement) 04/15/2018   Edwards Sapien 3 THV (size 23 mm, model # 9600TFX, serial # [id number]) via the TF approach   Severe aortic stenosis    a. s/p TAVR 04/2018.   Stroke [facility]) 2008   Past Surgical History:  Procedure Laterality Date   ABDOMINAL HYSTERECTOMY     COLONOSCOPY     COLONOSCOPY WITH PROPOFOL N/A 10/02/2018   Procedure: COLONOSCOPY WITH PROPOFOL;  Surgeon: Arta Silence, MD;  Location: [place];  Service: Endoscopy;  Laterality: N/A;   ESOPHAGOGASTRODUODENOSCOPY (EGD) WITH PROPOFOL N/A 10/02/2018   Procedure: ESOPHAGOGASTRODUODENOSCOPY (EGD) WITH PROPOFOL;  Surgeon: Arta Silence, MD;  Location: [place];  Service: Endoscopy;  Laterality: N/A;   EYE SURGERY Bilateral    cataract removal   GIVENS CAPSULE STUDY N/A 10/02/2018   Procedure: GIVENS CAPSULE STUDY;  Surgeon: Arta Silence, MD;  Location: [facility] ENDOSCOPY;  Service: Endoscopy;  Laterality: N/A;   RIGHT/LEFT HEART CATH AND CORONARY ANGIOGRAPHY N/A 03/06/2018   Procedure: RIGHT/LEFT HEART CATH AND CORONARY ANGIOGRAPHY;  Surgeon: Belva Crome, MD;  Location: [place] CV LAB;  Service: Cardiovascular;  Laterality: N/A;   TEE WITHOUT CARDIOVERSION N/A 04/01/2018   Procedure: TRANSESOPHAGEAL ECHOCARDIOGRAM (TEE);  Surgeon: Jerline Pain, MD;  Location: [facility] ENDOSCOPY;  Service: Cardiovascular;  Laterality: N/A;   TEE WITHOUT CARDIOVERSION N/A 04/15/2018   Procedure: TRANSESOPHAGEAL ECHOCARDIOGRAM (TEE);  Surgeon: Sherren Mocha, MD;  Location: [place];   Service: Open Heart Surgery;  Laterality: N/A;   TOTAL KNEE ARTHROPLASTY Right 04/14/2013   Dr Marlou Sa   TOTAL KNEE ARTHROPLASTY Right 04/14/2013   Procedure: TOTAL KNEE ARTHROPLASTY;  Surgeon: Meredith Pel, MD;  Location: [place];  Service: Orthopedics;  Laterality: Right;   TRANSCATHETER AORTIC VALVE REPLACEMENT, TRANSFEMORAL  04/15/2018   TRANSCATHETER AORTIC VALVE REPLACEMENT, TRANSFEMORAL Bilateral 04/15/2018   Procedure: TRANSCATHETER AORTIC VALVE REPLACEMENT, TRANSFEMORAL;  Surgeon: Sherren Mocha, MD;  Location: [place];  Service: Open Heart Surgery;  Laterality: Bilateral;     Current Meds  Medication Sig   amLODipine (NORVASC) 5 MG tablet Take 5 mg by mouth daily.   Cholecalciferol (VITAMIN D3) 5000 units CAPS Take 5,000 Units by mouth daily.   glucose blood (ACCU-CHEK AVIVA) test strip Use as instructed to check once daily DX: E11.9   hydrochlorothiazide (HYDRODIURIL) 25 MG tablet Take 12.5 mg by mouth daily.    metFORMIN (GLUCOPHAGE-XR) 750 MG 24 hr tablet Take 2 tablets (1,500 mg total) by mouth daily with breakfast.   metoprolol succinate (TOPROL-XL) 50 MG 24 hr tablet Take 50 mg by mouth daily.    pantoprazole (PROTONIX) 40 MG tablet Take  1 tablet (40 mg total) by mouth 2 (two) times daily.   Polyethyl Glycol-Propyl Glycol (SYSTANE OP) Place 1 drop into both eyes 2 (two) times daily.    simvastatin (ZOCOR) 20 MG tablet Take 20 mg by mouth every evening.   XARELTO 20 MG TABS tablet TAKE 1 TABLET BY MOUTH ONCE DAILY WITH SUPPER     Allergies:   Ace inhibitors; Keflex [cephalexin]; and Rifadin [rifampin]   Social History   Tobacco Use   Smoking status: Never Smoker   Smokeless tobacco: Never Used  Substance Use Topics   Alcohol use: No   Drug use: No     Family Hx: The patient's family history includes Hypertension in her father and mother; Stroke in her mother. There is no history of Heart attack.  ROS:   Please see the history of present illness.      All other systems reviewed and are negative.   Prior CV studies:   The following studies were reviewed today: TAVR OPERATIVE NOTE   Date of Procedure:04/15/2018  Preoperative Diagnosis:Severe Aortic Stenosis   Procedure:   Transcatheter Aortic Valve Replacement - Percutaneous Transfemoral Approach Edwards Sapien 3 THV (size 30m, model # 9600TFX, serial # [id number]  Co-Surgeons:Clarence H. ORoxy Manns MD and MSherren Mocha MD  Pre-operative Echo Findings: ? Severe aortic stenosis ? Normalleft ventricular systolic function  Post-operative Echo Findings: ? Noparavalvular leak ? Normal/unchangedleft ventricular systolic function  ___________________  2D ECHO 05/21/18 ( 1 month s/p TAVR) Study Conclusions - Left ventricle: The cavity size was normal. There was severe focal basal hypertrophy of the septum. Systolic function was normal. The estimated ejection fraction was in the range of 55% to 60%. Wall motion was normal; there were no regional wall motion abnormalities. Doppler parameters are consistent with abnormal left ventricular relaxation (grade 1 diastolic dysfunction). - Aortic valve: TAVR with a 23 mm Edwards Sapien 3 THV via the TF approach on 04/15/18. Peak velocity (S): 324 cm/s. Mean gradient (S): 24 mm Hg. Valve area (Vmax): 1.11 cm^2. - Mitral valve: Moderately calcified annulus. - Left atrium: The atrium was mildly dilated. - Tricuspid valve: There was moderate regurgitation. - Pulmonic valve: There was moderate regurgitation. - Pulmonary arteries: Systolic pressure was mildly increased. PA peak pressure: 43 mm Hg (S).   _______________   Echo 03/10/2019 IMPRESSIONS  1. The left ventricle has hyperdynamic systolic function, with an ejection fraction of >65%. The cavity size was normal. There is moderate concentric left ventricular hypertrophy. Left ventricular  diastolic Doppler parameters are consistent with  impaired relaxation. Elevated left ventricular end-diastolic pressure.  2. The right ventricle has normal systolic function. The cavity was mildly enlarged. There is no increase in right ventricular wall thickness. Right ventricular systolic pressure is mildly elevated with an estimated pressure of 45.8 mmHg.  3. Left atrial size was mildly dilated.  4. Right atrial size was mildly dilated.  5. The mitral valve is degenerative. Mild thickening of the mitral valve leaflet. Mild calcification of the mitral valve leaflet. There is moderate mitral annular calcification present. Mitral valve regurgitation is moderate by color flow Doppler. No  evidence of mitral valve stenosis.  6. Tricuspid valve regurgitation is moderate.  7. Aortic valve regurgitation was not assessed by color flow Doppler. Moderate stenosis of the aortic valve. SUMMARY TAVR with a 23 mm Edwards Sapien 3 THV via the TF approach on 04/15/18. Transaortic gradients are elevated but not significantly changed from the prior study, now mean 30 mmHg, previously 24 mmHg, peak  velocity now 3.27 m/s, previously 3.24 m/s.   Labs/Other Tests and Data Reviewed:    EKG:  No ECG reviewed.  Recent Labs: 04/16/2018: Magnesium 1.7 04/24/2018: TSH 1.09 09/29/2018: B Natriuretic Peptide 154.7 10/17/2018: ALT 12; Hemoglobin 9.6; Platelets 547 10/27/2018: BUN 13; Creatinine, Ser 0.86; Potassium 3.7; Sodium 138   Recent Lipid Panel Lab Results  Component Value Date/Time   CHOL 133 10/27/2018 08:11 AM   CHOL 145 10/17/2018 12:55 PM   TRIG 73.0 10/27/2018 08:11 AM   HDL 39.60 10/27/2018 08:11 AM   HDL 47 10/17/2018 12:55 PM   CHOLHDL 3 10/27/2018 08:11 AM   LDLCALC 79 10/27/2018 08:11 AM   LDLCALC 80 10/17/2018 12:55 PM    Wt Readings from Last 3 Encounters:  03/11/19 188 lb (85.3 kg)  10/30/18 190 lb 6.4 oz (86.4 kg)  10/17/18 187 lb 12.8 oz (85.2 kg)     Objective:    Vital Signs:   Ht _0  (1.626 m)    Wt 188 lb (85.3 kg)    BMI 32.27 kg/m     ASSESSMENT & PLAN:    Severe AS s/p TAVR: doing well with NYHA class I symptoms. Activity is mostly limited by knee pain. Echo 03/10/19 showed EF 65%, mod MR/TR, normally functioning TAVR with elevated mean gradient of 30 mm Hg (previously 24 mm Hg felt to be related to PPM.) SBE prophylaxis discussed; the patient is edentulous and does not go to the dentist. Continue on Xarelto for afib.   HUT:MLYY'T taken Bp recently as she does not have a cuff. I have talked to Raquel Sarna with social work and we will send the patient a BP cuff.   KPT:WSFKCLEX statin   PAF: no palpitations. Continue Xarelto.   Anemia: Hg 9.6 in January, which appears to be her baseline. Having some dizzy spells. Does not know her BP. Denies fatigue or weakness or blood in stool or urine. Will get repeat CBC given recent GI bleed in 09/2018 and long term use of Xarelto. Will also get BMET given Covid 19 pandemic and coming in for labs.   COVID-19 Education: The signs and symptoms of COVID-19 were discussed with the patient and how to seek care for testing (follow up with PCP or arrange E-visit).  The importance of social distancing was discussed today.  Time:   Today, I have spent 25 minutes with the patient with telehealth technology discussing the above problems.     Medication Adjustments/Labs and Tests Ordered: Current medicines are reviewed at length with the patient today.  Concerns regarding medicines are outlined above.   Tests Ordered: Orders Placed This Encounter  Procedures   CBC   Basic Metabolic Panel (BMET)    Medication Changes: No orders of the defined types were placed in this encounter.   Disposition:  Follow up prn structural heart clinic   Signed, Angelena Form, PA-C  03/11/2019 2:54 PM    [place]

## 2019-03-11 ENCOUNTER — Telehealth (INDEPENDENT_AMBULATORY_CARE_PROVIDER_SITE_OTHER): Payer: Medicare Other | Admitting: Physician Assistant

## 2019-03-11 ENCOUNTER — Telehealth: Payer: Self-pay | Admitting: *Deleted

## 2019-03-11 ENCOUNTER — Telehealth: Payer: Self-pay | Admitting: Licensed Clinical Social Worker

## 2019-03-11 ENCOUNTER — Encounter: Payer: Self-pay | Admitting: Physician Assistant

## 2019-03-11 VITALS — Ht 64.0 in | Wt 188.0 lb

## 2019-03-11 DIAGNOSIS — Z952 Presence of prosthetic heart valve: Secondary | ICD-10-CM

## 2019-03-11 DIAGNOSIS — E785 Hyperlipidemia, unspecified: Secondary | ICD-10-CM

## 2019-03-11 DIAGNOSIS — I10 Essential (primary) hypertension: Secondary | ICD-10-CM

## 2019-03-11 DIAGNOSIS — Z7189 Other specified counseling: Secondary | ICD-10-CM

## 2019-03-11 DIAGNOSIS — I480 Paroxysmal atrial fibrillation: Secondary | ICD-10-CM

## 2019-03-11 DIAGNOSIS — D649 Anemia, unspecified: Secondary | ICD-10-CM

## 2019-03-11 NOTE — Telephone Encounter (Signed)
Consent obtained for phone visit today. Pt has scales and will weigh. Does not have BP cuff. KCCQ form completed.   Virtual Visit Pre-Appointment Phone Call  "(Name), I am calling you today to discuss your upcoming appointment. We are currently trying to limit exposure to the virus that causes COVID-19 by seeing patients at home rather than in the office."  1. "What is the BEST phone number to call the day of the visit?" - include this in appointment [phone]  2. "Do you have or have access to (through a family member/friend) a smartphone with video capability that we can use for your visit?" No a. If yes - list this number in appt notes as "cell" (if different from BEST phone #) and list the appointment type as a VIDEO visit in appointment notes b. If no - list the appointment type as a PHONE visit in appointment notes  3. Confirm consent - "In the setting of the current Covid19 crisis, you are scheduled for a phone visit with your provider on June 3,2020 at 1:30.  Just as we do with many in-office visits, in order for you to participate in this visit, we must obtain consent.  If you'd like, I can send this to your mychart (if signed up) or email for you to review.  Otherwise, I can obtain your verbal consent now.  All virtual visits are billed to your insurance company just like a normal visit would be.  By agreeing to a virtual visit, we'd like you to understand that the technology does not allow for your provider to perform an examination, and thus may limit your provider's ability to fully assess your condition. If your provider identifies any concerns that need to be evaluated in person, we will make arrangements to do so.  Finally, though the technology is pretty good, we cannot assure that it will always work on either your or our end, and in the setting of a video visit, we may have to convert it to a phone-only visit.  In either situation, we cannot ensure that we have a secure  connection.  Are you willing to proceed?" STAFF: Did the patient verbally acknowledge consent to telehealth visit? Document YES/NO here: yes  4. Advise patient to be prepared - "Two hours prior to your appointment, go ahead and check your blood pressure, pulse, oxygen saturation, and your weight (if you have the equipment to check those) and write them all down. When your visit starts, your provider will ask you for this information. If you have an Apple Watch or Kardia device, please plan to have heart rate information ready on the day of your appointment. Please have a pen and paper handy nearby the day of the visit as well."  5. Give patient instructions for MyChart download to smartphone OR Doximity/Doxy.me as below if video visit (depending on what platform provider is using)  6. Inform patient they will receive a phone call 15 minutes prior to their appointment time (may be from unknown caller ID) so they should be prepared to answer    TELEPHONE CALL NOTE  Janet Williamson has been deemed a candidate for a follow-up tele-health visit to limit community exposure during the Covid-19 pandemic. I spoke with the patient via phone to ensure availability of phone/video source, confirm preferred email & phone number, and discuss instructions and expectations.  I reminded Janet Williamson to be prepared with any vital sign and/or heart rhythm information that could potentially be obtained via  home monitoring, at the time of her visit. I reminded Janet Williamson to expect a phone call prior to her visit.  Genice Rouge, RN 03/11/2019 8:14 AM   INSTRUCTIONS FOR DOWNLOADING THE MYCHART APP TO SMARTPHONE  - The patient must first make sure to have activated MyChart and know their login information - If Apple, go to [employer] and type in MyChart in the search bar and download the app. If Android, ask patient to go to [employer] and type in Daisy in the search bar and download the app.  The app is free but as with any other app downloads, their phone may require them to verify saved payment information or Apple/Android password.  - The patient will need to then log into the app with their MyChart username and password, and select [facility] as their healthcare provider to link the account. When it is time for your visit, go to the MyChart app, find appointments, and click Begin Video Visit. Be sure to Select Allow for your device to access the Microphone and Camera for your visit. You will then be connected, and your provider will be with you shortly.  **If they have any issues connecting, or need assistance please contact MyChart service desk [phone])**  **If using a computer, in order to ensure the best quality for their visit they will need to use either of the following Internet Browsers: [employer] or Google Chrome**  IF USING DOXIMITY or DOXY.ME - The patient will receive a link just prior to their visit by text.     FULL LENGTH CONSENT FOR TELE-HEALTH VISIT   I hereby voluntarily request, consent and authorize [facility] and its employed or contracted physicians, physician assistants, nurse practitioners or other licensed health care professionals (the Practitioner), to provide me with telemedicine health care services (the "Services") as deemed necessary by the treating Practitioner. I acknowledge and consent to receive the Services by the Practitioner via telemedicine. I understand that the telemedicine visit will involve communicating with the Practitioner through live audiovisual communication technology and the disclosure of certain medical information by electronic transmission. I acknowledge that I have been given the opportunity to request an in-person assessment or other available alternative prior to the telemedicine visit and am voluntarily participating in the telemedicine visit.  I understand that I have the right to withhold or  withdraw my consent to the use of telemedicine in the course of my care at any time, without affecting my right to future care or treatment, and that the Practitioner or I may terminate the telemedicine visit at any time. I understand that I have the right to inspect all information obtained and/or recorded in the course of the telemedicine visit and may receive copies of available information for a reasonable fee.  I understand that some of the potential risks of receiving the Services via telemedicine include:  Marland Kitchen Delay or interruption in medical evaluation due to technological equipment failure or disruption; . Information transmitted may not be sufficient (e.g. poor resolution of images) to allow for appropriate medical decision making by the Practitioner; and/or  . In rare instances, security protocols could fail, causing a breach of personal health information.  Furthermore, I acknowledge that it is my responsibility to provide information about my medical history, conditions and care that is complete and accurate to the best of my ability. I acknowledge that Practitioner's advice, recommendations, and/or decision may be based on factors not within their control, such as incomplete  or inaccurate data provided by me or distortions of diagnostic images or specimens that may result from electronic transmissions. I understand that the practice of medicine is not an exact science and that Practitioner makes no warranties or guarantees regarding treatment outcomes. I acknowledge that I will receive a copy of this consent concurrently upon execution via email to the email address I last provided but may also request a printed copy by calling the office of [facility].    I understand that my insurance will be billed for this visit.   I have read or had this consent read to me. . I understand the contents of this consent, which adequately explains the benefits and risks of the Services being provided via  telemedicine.  . I have been provided ample opportunity to ask questions regarding this consent and the Services and have had my questions answered to my satisfaction. . I give my informed consent for the services to be provided through the use of telemedicine in my medical care  By participating in this telemedicine visit I agree to the above.

## 2019-03-11 NOTE — Telephone Encounter (Signed)
CSW referred to assist patient with obtaining a BP cuff. CSW contacted patient to inform cuff will be delivered to home next week. Patient grateful for support and assistance. CSW available as needed. Jackie Nuel Dejaynes, LCSW, CCSW-MCS [phone]  

## 2019-03-11 NOTE — Progress Notes (Signed)
Sounds good agree

## 2019-03-11 NOTE — Patient Instructions (Signed)
Medication Instructions:  Your physician recommends that you continue on your current medications as directed. Please refer to the Current Medication list given to you today.  If you need a refill on your cardiac medications before your next appointment, please call your pharmacy.   Lab work: Your physician recommends that you return for lab work on June 5,2020 at 11:15  If you have labs (blood work) drawn today and your tests are completely normal, you will receive your results only by: Marland Kitchen MyChart Message (if you have MyChart) OR . A paper copy in the mail If you have any lab test that is abnormal or we need to change your treatment, we will call you to review the results.  Testing/Procedures: none  Follow-Up: At [facility], you and your health needs are our priority.  As part of our continuing mission to provide you with exceptional heart care, we have created designated Provider Care Teams.  These Care Teams include your primary Cardiologist (physician) and Advanced Practice Providers (APPs -  Physician Assistants and Nurse Practitioners) who all work together to provide you with the care you need, when you need it. You will need a follow up appointment in November 2020.   Please call our office 2 months in advance to schedule this appointment.  You may see Donato Schultz, MD or one of the following Advanced Practice Providers on your designated Care Team:   Norma Fredrickson, NP Nada Boozer, NP . Georgie Chard, NP  Any Other Special Instructions Will Be Listed Below (If Applicable).

## 2019-03-11 NOTE — Telephone Encounter (Signed)
.    Pt coming to office for lab work on June 5,2020  COVID-19 Pre-Screening Questions:  . In the past 7 to 10 days have you had a cough,  shortness of breath, headache, congestion, fever (100 or greater) body aches, chills, sore throat, or sudden loss of taste or sense of smell? no . Have you been around anyone with known Covid 19.-no . Have you been around anyone who is awaiting Covid 19 test results in the past 7 to 10 days? no . Have you been around anyone who has been exposed to Covid 19, or has mentioned symptoms of Covid 19 within the past 7 to 10 days? no    Pt aware to wear mask to appointment and to contact us if any changes in these questions.

## 2019-03-12 ENCOUNTER — Telehealth: Payer: Self-pay | Admitting: *Deleted

## 2019-03-12 NOTE — Telephone Encounter (Signed)
    COVID-19 Pre-Screening Questions:  . In the past 7 to 10 days have you had a cough,  shortness of breath, headache, congestion, fever (100 or greater) body aches, chills, sore throat, or sudden loss of taste or sense of smell? . Have you been around anyone with known Covid 19. . Have you been around anyone who is awaiting Covid 19 test results in the past 7 to 10 days? . Have you been around anyone who has been exposed to Covid 19, or has mentioned symptoms of Covid 19 within the past 7 to 10 days?  If you have any concerns/questions about symptoms patients report during screening (either on the phone or at threshold). Contact the provider seeing the patient or DOD for further guidance.  If neither are available contact a member of the leadership team.           Contacted patient via telephone call. No to all Covid 19 questions. Has a mask.  KB  

## 2019-03-13 ENCOUNTER — Other Ambulatory Visit: Payer: Medicare Other | Admitting: *Deleted

## 2019-03-13 ENCOUNTER — Other Ambulatory Visit: Payer: Self-pay

## 2019-03-13 DIAGNOSIS — I10 Essential (primary) hypertension: Secondary | ICD-10-CM

## 2019-03-13 DIAGNOSIS — Z952 Presence of prosthetic heart valve: Secondary | ICD-10-CM

## 2019-03-13 DIAGNOSIS — D649 Anemia, unspecified: Secondary | ICD-10-CM

## 2019-03-13 DIAGNOSIS — I480 Paroxysmal atrial fibrillation: Secondary | ICD-10-CM

## 2019-03-13 LAB — BASIC METABOLIC PANEL
BUN/Creatinine Ratio: 18 (ref 12–28)
BUN: 13 mg/dL (ref 8–27)
CO2: 26 mmol/L (ref 20–29)
Calcium: 10.1 mg/dL (ref 8.7–10.3)
Chloride: 98 mmol/L (ref 96–106)
Creatinine, Ser: 0.72 mg/dL (ref 0.57–1.00)
GFR calc Af Amer: 93 mL/min/{1.73_m2} (ref >59)
GFR calc non Af Amer: 80 mL/min/{1.73_m2} (ref >59)
Glucose: 127 mg/dL — ABNORMAL HIGH (ref 65–99)
Potassium: 3.8 mmol/L (ref 3.5–5.2)
Sodium: 137 mmol/L (ref 134–144)

## 2019-03-13 LAB — CBC
Hematocrit: 29.9 % — ABNORMAL LOW (ref 34.0–46.6)
Hemoglobin: 8 g/dL — ABNORMAL LOW (ref 11.1–15.9)
MCH: 18 pg — ABNORMAL LOW (ref 26.6–33.0)
MCHC: 26.8 g/dL — ABNORMAL LOW (ref 31.5–35.7)
MCV: 67 fL — ABNORMAL LOW (ref 79–97)
Platelets: 511 10*3/uL — ABNORMAL HIGH (ref 150–450)
RBC: 4.45 x10E6/uL (ref 3.77–5.28)
RDW: 21.8 % — ABNORMAL HIGH (ref 11.7–15.4)
WBC: 9.7 10*3/uL (ref 3.4–10.8)

## 2019-03-27 ENCOUNTER — Encounter: Payer: Self-pay | Admitting: Thoracic Surgery (Cardiothoracic Vascular Surgery)

## 2019-03-30 ENCOUNTER — Other Ambulatory Visit (HOSPITAL_COMMUNITY): Payer: Medicare Other

## 2019-04-01 ENCOUNTER — Telehealth: Payer: Medicare Other | Admitting: Physician Assistant

## 2019-04-21 ENCOUNTER — Other Ambulatory Visit: Payer: Self-pay

## 2019-04-21 ENCOUNTER — Inpatient Hospital Stay (HOSPITAL_COMMUNITY)
Admission: EM | Admit: 2019-04-21 | Discharge: 2019-04-24 | DRG: 378 | Disposition: A | Discharge status: Home / Self Care | Payer: Medicare Other | Attending: Internal Medicine | Admitting: Internal Medicine

## 2019-04-21 ENCOUNTER — Emergency Department (HOSPITAL_COMMUNITY): Payer: Medicare Other

## 2019-04-21 ENCOUNTER — Encounter (HOSPITAL_COMMUNITY): Payer: Self-pay

## 2019-04-21 DIAGNOSIS — Z20828 Contact with and (suspected) exposure to other viral communicable diseases: Secondary | ICD-10-CM | POA: Diagnosis present

## 2019-04-21 DIAGNOSIS — I480 Paroxysmal atrial fibrillation: Secondary | ICD-10-CM | POA: Diagnosis not present

## 2019-04-21 DIAGNOSIS — Z7901 Long term (current) use of anticoagulants: Secondary | ICD-10-CM

## 2019-04-21 DIAGNOSIS — Z8673 Personal history of transient ischemic attack (TIA), and cerebral infarction without residual deficits: Secondary | ICD-10-CM

## 2019-04-21 DIAGNOSIS — K219 Gastro-esophageal reflux disease without esophagitis: Secondary | ICD-10-CM | POA: Diagnosis present

## 2019-04-21 DIAGNOSIS — K922 Gastrointestinal hemorrhage, unspecified: Secondary | ICD-10-CM

## 2019-04-21 DIAGNOSIS — E0800 Diabetes mellitus due to underlying condition with hyperosmolarity without nonketotic hyperglycemic-hyperosmolar coma (NKHHC): Secondary | ICD-10-CM | POA: Diagnosis not present

## 2019-04-21 DIAGNOSIS — I350 Nonrheumatic aortic (valve) stenosis: Secondary | ICD-10-CM | POA: Diagnosis not present

## 2019-04-21 DIAGNOSIS — Z6835 Body mass index (BMI) 35.0-35.9, adult: Secondary | ICD-10-CM | POA: Diagnosis not present

## 2019-04-21 DIAGNOSIS — Z8711 Personal history of peptic ulcer disease: Secondary | ICD-10-CM

## 2019-04-21 DIAGNOSIS — I447 Left bundle-branch block, unspecified: Secondary | ICD-10-CM | POA: Diagnosis present

## 2019-04-21 DIAGNOSIS — D62 Acute posthemorrhagic anemia: Secondary | ICD-10-CM | POA: Diagnosis present

## 2019-04-21 DIAGNOSIS — E785 Hyperlipidemia, unspecified: Secondary | ICD-10-CM | POA: Diagnosis present

## 2019-04-21 DIAGNOSIS — Z888 Allergy status to other drugs, medicaments and biological substances status: Secondary | ICD-10-CM

## 2019-04-21 DIAGNOSIS — Z7984 Long term (current) use of oral hypoglycemic drugs: Secondary | ICD-10-CM

## 2019-04-21 DIAGNOSIS — Z79899 Other long term (current) drug therapy: Secondary | ICD-10-CM | POA: Diagnosis not present

## 2019-04-21 DIAGNOSIS — E119 Type 2 diabetes mellitus without complications: Secondary | ICD-10-CM

## 2019-04-21 DIAGNOSIS — Z952 Presence of prosthetic heart valve: Secondary | ICD-10-CM | POA: Diagnosis not present

## 2019-04-21 DIAGNOSIS — Z8672 Personal history of thrombophlebitis: Secondary | ICD-10-CM

## 2019-04-21 DIAGNOSIS — E669 Obesity, unspecified: Secondary | ICD-10-CM | POA: Diagnosis present

## 2019-04-21 DIAGNOSIS — Z881 Allergy status to other antibiotic agents status: Secondary | ICD-10-CM | POA: Diagnosis not present

## 2019-04-21 DIAGNOSIS — K921 Melena: Principal | ICD-10-CM | POA: Diagnosis present

## 2019-04-21 DIAGNOSIS — R0602 Shortness of breath: Secondary | ICD-10-CM | POA: Diagnosis present

## 2019-04-21 DIAGNOSIS — I10 Essential (primary) hypertension: Secondary | ICD-10-CM | POA: Diagnosis present

## 2019-04-21 DIAGNOSIS — Z8719 Personal history of other diseases of the digestive system: Secondary | ICD-10-CM | POA: Diagnosis present

## 2019-04-21 DIAGNOSIS — E039 Hypothyroidism, unspecified: Secondary | ICD-10-CM | POA: Diagnosis present

## 2019-04-21 DIAGNOSIS — Z794 Long term (current) use of insulin: Secondary | ICD-10-CM | POA: Diagnosis not present

## 2019-04-21 DIAGNOSIS — D649 Anemia, unspecified: Secondary | ICD-10-CM | POA: Diagnosis present

## 2019-04-21 LAB — SARS CORONAVIRUS 2 BY RT PCR (HOSPITAL ORDER, PERFORMED IN ~~LOC~~ HOSPITAL LAB): SARS Coronavirus 2: NEGATIVE

## 2019-04-21 LAB — CBC
HCT: 20.3 % — ABNORMAL LOW (ref 36.0–46.0)
Hemoglobin: 5.3 g/dL — CL (ref 12.0–15.0)
MCH: 18.5 pg — ABNORMAL LOW (ref 26.0–34.0)
MCHC: 26.1 g/dL — ABNORMAL LOW (ref 30.0–36.0)
MCV: 70.7 fL — ABNORMAL LOW (ref 80.0–100.0)
Platelets: 483 10*3/uL — ABNORMAL HIGH (ref 150–400)
RBC: 2.87 MIL/uL — ABNORMAL LOW (ref 3.87–5.11)
RDW: 24.5 % — ABNORMAL HIGH (ref 11.5–15.5)
WBC: 10.9 10*3/uL — ABNORMAL HIGH (ref 4.0–10.5)
nRBC: 0.6 % — ABNORMAL HIGH (ref 0.0–0.2)

## 2019-04-21 LAB — BASIC METABOLIC PANEL
Anion gap: 13 (ref 5–15)
BUN: 18 mg/dL (ref 8–23)
CO2: 25 mmol/L (ref 22–32)
Calcium: 9.8 mg/dL (ref 8.9–10.3)
Chloride: 99 mmol/L (ref 98–111)
Creatinine, Ser: 0.79 mg/dL (ref 0.44–1.00)
GFR calc Af Amer: >60 mL/min (ref >60)
GFR calc non Af Amer: >60 mL/min (ref >60)
Glucose, Bld: 126 mg/dL — ABNORMAL HIGH (ref 70–99)
Potassium: 4.2 mmol/L (ref 3.5–5.1)
Sodium: 137 mmol/L (ref 135–145)

## 2019-04-21 LAB — PREPARE RBC (CROSSMATCH)

## 2019-04-21 LAB — PROTIME-INR
INR: 1.5 — ABNORMAL HIGH (ref 0.8–1.2)
Prothrombin Time: 18.2 seconds — ABNORMAL HIGH (ref 11.4–15.2)

## 2019-04-21 LAB — POC OCCULT BLOOD, ED: Fecal Occult Bld: POSITIVE — AB

## 2019-04-21 LAB — MRSA PCR SCREENING: MRSA by PCR: NEGATIVE

## 2019-04-21 MED ORDER — ONDANSETRON HCL 4 MG/2ML IJ SOLN: 4.0000 mg | Freq: Four times a day (QID) | INTRAMUSCULAR | Status: DC | PRN

## 2019-04-21 MED ORDER — PANTOPRAZOLE SODIUM 40 MG IV SOLR
40.0000 mg | Freq: Two times a day (BID) | INTRAVENOUS | Status: DC
  Administered 2019-04-21 – 2019-04-23 (×4): 40 mg via INTRAVENOUS
  Filled 2019-04-21 (×4): qty 40

## 2019-04-21 MED ORDER — ONDANSETRON HCL 4 MG PO TABS: 4.0000 mg | ORAL_TABLET | Freq: Four times a day (QID) | ORAL | Status: DC | PRN

## 2019-04-21 MED ORDER — ALBUTEROL SULFATE (2.5 MG/3ML) 0.083% IN NEBU: 2.5000 mg | INHALATION_SOLUTION | Freq: Four times a day (QID) | RESPIRATORY_TRACT | Status: DC

## 2019-04-21 MED ORDER — SODIUM CHLORIDE 0.9 % IV SOLN
10.0000 mL/h | Freq: Once | INTRAVENOUS | Status: AC
  Administered 2019-04-21: 10 mL/h via INTRAVENOUS

## 2019-04-21 MED ORDER — ACETAMINOPHEN 325 MG PO TABS: 325.0000 mg | ORAL_TABLET | Freq: Three times a day (TID) | ORAL | Status: DC | PRN

## 2019-04-21 MED ORDER — LEVALBUTEROL HCL 0.63 MG/3ML IN NEBU: 0.6300 mg | INHALATION_SOLUTION | Freq: Four times a day (QID) | RESPIRATORY_TRACT | Status: DC | PRN

## 2019-04-21 MED ORDER — IPRATROPIUM BROMIDE 0.02 % IN SOLN: 0.5000 mg | Freq: Four times a day (QID) | RESPIRATORY_TRACT | Status: DC

## 2019-04-21 MED ORDER — METOPROLOL SUCCINATE ER 50 MG PO TB24
50.0000 mg | ORAL_TABLET | Freq: Every day | ORAL | Status: DC
  Administered 2019-04-22 – 2019-04-24 (×3): 50 mg via ORAL
  Filled 2019-04-21 (×4): qty 1

## 2019-04-21 MED ORDER — AMLODIPINE BESYLATE 5 MG PO TABS
5.0000 mg | ORAL_TABLET | Freq: Every day | ORAL | Status: DC
  Administered 2019-04-21: 5 mg via ORAL
  Filled 2019-04-21 (×2): qty 1

## 2019-04-21 MED ORDER — IPRATROPIUM-ALBUTEROL 0.5-2.5 (3) MG/3ML IN SOLN
3.0000 mL | Freq: Four times a day (QID) | RESPIRATORY_TRACT | Status: DC
  Administered 2019-04-21: 3 mL via RESPIRATORY_TRACT
  Filled 2019-04-21: qty 3

## 2019-04-21 MED ORDER — SODIUM CHLORIDE 0.9 % IV SOLN
INTRAVENOUS | Status: DC
  Administered 2019-04-21 – 2019-04-22 (×2): via INTRAVENOUS

## 2019-04-21 MED ORDER — SIMVASTATIN 20 MG PO TABS
20.0000 mg | ORAL_TABLET | Freq: Every evening | ORAL | Status: DC
  Administered 2019-04-21 – 2019-04-23 (×3): 20 mg via ORAL
  Filled 2019-04-21 (×3): qty 1

## 2019-04-21 NOTE — ED Notes (Signed)
ED TO INPATIENT HANDOFF REPORT  ED Nurse Name and Phone #: Britta MccreedyMichele Chaske Paskett RN  S Name/Age/Gender Janet Williamson 78 y.o. female Room/Bed: 017C/017C  Code Status   Code Status: Prior  Home/SNF/Other Home Patient oriented to: self, place, time and situation Is this baseline? Yes   Triage Complete: Triage complete  Chief Complaint SOB; Palpitations  Triage Note Pt reports SOB and palpitations since last Thursday, denies CP, cough or fever, resp e/u at this time, nad noted.    Allergies Allergies  Allergen Reactions  . Ace Inhibitors Swelling and Other (See Comments)    Angioedema   . Keflex [Cephalexin] Swelling and Other (See Comments)    Lips became swollwn  . Rifadin [Rifampin] Swelling and Other (See Comments)    Lips became swollen    Level of Care/Admitting Diagnosis ED Disposition    ED Disposition Condition Comment   Admit  [facility] [[id number]]  Level of Care: Progressive [102]  Covid Evaluation: N/A  Diagnosis: GI bleed [[id number]][[id number]]  Admitting Physician: Joycelyn DasPOKHREL, LAXMAN [[id number]]  Attending Physician: Joycelyn DasPOKHREL, LAXMAN [[id number]]  Estimated length of stay: past midnight tomorrow  Certification:: I certify this patient will need inpatient services for at least 2 midnights  PT Class (Do Not Modify): Inpatient [101]  PT Acc Code (Do Not Modify): Private [1]       B Medical/Surgery History Past Medical History:  Diagnosis Date  . Anemia   . Cardiac mass    a. 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 Not seen on most recent TEE 2019.  . Cardiomyopathy in other disease   . Cataracts, bilateral   . Diabetes mellitus    Type 2  . Generalized osteoarthritis   . GERD (gastroesophageal reflux disease)   . HLD (hyperlipidemia)   . Hypertension   . Hypothyroidism   . LBBB (left bundle branch block)   . PAF (paroxysmal atrial fibrillation) (HCC)    a. s/p DCCV 06/2017, on Xarelto  . Phlebitis   . S/P TAVR (transcatheter  aortic valve replacement) 04/15/2018   Edwards Sapien 3 THV (size 23 mm, model # 9600TFX, serial # [id number]) via the TF approach  . Severe aortic stenosis    a. s/p TAVR 04/2018.  . Stroke [facility](HCC) 2008   Past Surgical History:  Procedure Laterality Date  . ABDOMINAL HYSTERECTOMY    . COLONOSCOPY    . COLONOSCOPY WITH PROPOFOL N/A 10/02/2018   Procedure: COLONOSCOPY WITH PROPOFOL;  Surgeon: Willis Modenautlaw, William, MD;  [facility];  Service: Endoscopy;  Laterality: N/A;  . ESOPHAGOGASTRODUODENOSCOPY (EGD) WITH PROPOFOL N/A 10/02/2018   Procedure: ESOPHAGOGASTRODUODENOSCOPY (EGD) WITH PROPOFOL;  Surgeon: Willis Modenautlaw, William, MD;  [facility];  Service: Endoscopy;  Laterality: N/A;  . EYE SURGERY Bilateral    cataract removal  . GIVENS CAPSULE STUDY N/A 10/02/2018   Procedure: GIVENS CAPSULE STUDY;  Surgeon: Willis Modenautlaw, William, MD;  [facility];  Service: Endoscopy;  Laterality: N/A;  . RIGHT/LEFT HEART CATH AND CORONARY ANGIOGRAPHY N/A 03/06/2018   Procedure: RIGHT/LEFT HEART CATH AND CORONARY ANGIOGRAPHY;  Surgeon: Lyn RecordsSmith, Henry W, MD;  [facility] INVAS[facility] CV LAB;  Service: Cardiovascular;  Laterality: N/A;  . TEE WITHOUT CARDIOVERSION N/A 04/01/2018   Procedure: TRANSESOPHAGEAL ECHOCARDIOGRAM (TEE);  Surgeon: Jake BatheSkains, Mark C, MD;  [facility];  Service: Cardiovascular;  Laterality: N/A;  . TEE WITHOUT CARDIOVERSION N/A 04/15/2018   Procedure: TRANSESOPHAGEAL ECHOCARDIOGRAM (TEE);  Surgeon: Tonny Bollmanooper, Michael, MD;  [facility];  Service: Open Heart Surgery;  Laterality: N/A;  .  TOTAL KNEE ARTHROPLASTY Right 04/14/2013   Dr August Saucer  . TOTAL KNEE ARTHROPLASTY Right 04/14/2013   Procedure: TOTAL KNEE ARTHROPLASTY;  Surgeon: Cammy Copa, MD;  Location: [facility];  Service: Orthopedics;  Laterality: Right;  . TRANSCATHETER AORTIC VALVE REPLACEMENT, TRANSFEMORAL  04/15/2018  . TRANSCATHETER AORTIC VALVE REPLACEMENT, TRANSFEMORAL Bilateral 04/15/2018   Procedure:  TRANSCATHETER AORTIC VALVE REPLACEMENT, TRANSFEMORAL;  Surgeon: Tonny Bollman, MD;  Location: [facility] OR;  Service: Open Heart Surgery;  Laterality: Bilateral;     A IV Location/Drains/Wounds Patient Lines/Drains/Airways Status   Active Line/Drains/Airways    Name:   Placement date:   Placement time:   Site:   Days:   Peripheral IV 04/21/19 Right Antecubital   04/21/19    1500    Antecubital   less than 1   Incision 04/14/13 Leg Right   04/14/13    1014     2198   Incision (Closed) 04/15/18 Groin Right   04/15/18    1138     371   Incision (Closed) 04/15/18 Groin Left   04/15/18    1138     371          Intake/Output Last 24 hours No intake or output data in the 24 hours ending 04/21/19 1750  Labs/Imaging Results for orders placed or performed during the hospital encounter of 04/21/19 (from the past 48 hour(s))  CBC     Status: Abnormal   Collection Time: 04/21/19  1:58 PM  Result Value Ref Range   WBC 10.9 (H) 4.0 - 10.5 K/uL   RBC 2.87 (L) 3.87 - 5.11 MIL/uL   Hemoglobin 5.3 (LL) 12.0 - 15.0 g/dL    Comment: Reticulocyte Hemoglobin testing may be clinically indicated, consider ordering this additional test [id number] REPEATED TO VERIFY CRITICAL RESULT CALLED TO, READ BACK BY AND VERIFIED WITH: M SCRUGGS,RN 1436 04/21/2019 D BRADLEY    HCT 20.3 (L) 36.0 - 46.0 %   MCV 70.7 (L) 80.0 - 100.0 fL   MCH 18.5 (L) 26.0 - 34.0 pg   MCHC 26.1 (L) 30.0 - 36.0 g/dL   RDW 17.5 (H) 10.2 - 58.5 %   Platelets 483 (H) 150 - 400 K/uL   nRBC 0.6 (H) 0.0 - 0.2 %    Comment: Performed at [facility], [address], [place], [place] [address]  Basic metabolic panel     Status: Abnormal   Collection Time: 04/21/19  1:58 PM  Result Value Ref Range   Sodium 137 135 - 145 mmol/L   Potassium 4.2 3.5 - 5.1 mmol/L   Chloride 99 98 - 111 mmol/L   CO2 25 22 - 32 mmol/L   Glucose, Bld 126 (H) 70 - 99 mg/dL   BUN 18 8 - 23 mg/dL   Creatinine, Ser 4.23 0.44 - 1.00 mg/dL   Calcium 9.8 8.9 -  53.6 mg/dL   GFR calc non Af Amer >60 >60 mL/min   GFR calc Af Amer >60 >60 mL/min   Anion gap 13 5 - 15    Comment: Performed at [facility], [address] [place], [place] [address]  Type and screen [facility]     Status: None (Preliminary result)   Collection Time: 04/21/19  3:00 PM  Result Value Ref Range   ABO/RH(D) O POS    Antibody Screen POS    Sample Expiration 04/24/2019,2359    Antibody Identification ANTI K    DAT, IgG NEG    Unit  Number [id number]    Blood Component Type RED CELLS,LR    Unit division 00    Status of Unit ALLOCATED    Donor AG Type NEGATIVE FOR KELL ANTIGEN    Transfusion Status OK TO TRANSFUSE    Crossmatch Result COMPATIBLE    Unit Number [id number]    Blood Component Type RED CELLS,LR    Unit division 00    Status of Unit ALLOCATED    Donor AG Type NEGATIVE FOR KELL ANTIGEN    Transfusion Status OK TO TRANSFUSE    Crossmatch Result COMPATIBLE    Unit Number [id number]    Blood Component Type RED CELLS,LR    Unit division 00    Status of Unit ALLOCATED    Donor AG Type NEGATIVE FOR KELL ANTIGEN    Transfusion Status OK TO TRANSFUSE    Crossmatch Result COMPATIBLE    Unit Number [id number]    Blood Component Type RED CELLS,LR    Unit division 00    Status of Unit ALLOCATED    Donor AG Type NEGATIVE FOR KELL ANTIGEN    Transfusion Status OK TO TRANSFUSE    Crossmatch Result COMPATIBLE   Prepare RBC     Status: None   Collection Time: 04/21/19  3:00 PM  Result Value Ref Range   Order Confirmation      ORDER PROCESSED BY BLOOD BANK Performed at [facility], [place] [address] [place], [place] [address]   Protime-INR     Status: Abnormal   Collection Time: 04/21/19  3:25 PM  Result Value Ref Range   Prothrombin Time 18.2 (H) 11.4 - 15.2 seconds   INR 1.5 (H) 0.8 - 1.2    Comment: (NOTE) INR goal varies based on device and disease states. Performed at [facility], [place] [address],  [place], [place] [address]   POC occult blood, ED RN will collect     Status: Abnormal   Collection Time: 04/21/19  3:38 PM  Result Value Ref Range   Fecal Occult Bld POSITIVE (A) NEGATIVE   Dg Chest 2 View  Result Date: 04/21/2019 CLINICAL DATA:  Shortness of breath for 5 days. EXAM: CHEST - 2 VIEW COMPARISON:  PA and lateral chest 09/29/2018 and 07/06/2017. FINDINGS: The patient is status post aortic valve replacement. Heart size is normal. Aortic atherosclerosis is noted. Lungs are clear. No pneumothorax or pleural effusion. No acute or focal bony abnormality. IMPRESSION: No acute disease. Atherosclerosis. Electronically Signed   By: Inge Rise M.D.   On: 04/21/2019 14:55    Pending Labs Unresulted Labs (From admission, onward)    Start     Ordered   04/21/19 1605  SARS Coronavirus 2 (CEPHEID - Performed in [place] hospital lab), [place]  (Asymptomatic Patients Labs)  Once,   STAT    Question:  Rule Out  Answer:  Yes   04/21/19 1604   Signed and Held  Urine culture  Once,   R     Signed and Held   Signed and Held  Comprehensive metabolic panel  Tomorrow morning,   R     Signed and Held   Signed and Held  CBC  Tomorrow morning,   R     Signed and Held          Vitals/Pain Today's Vitals   04/21/19 1345 04/21/19 1355 04/21/19 1548  BP: 122/61    Pulse: 97    Resp: 18    Temp: 98.7 F (37.1  C)    TempSrc: Oral    SpO2: 100%    PainSc:  0-No pain 0-No pain    Isolation Precautions No active isolations  Medications Medications  0.9 %  sodium chloride infusion (has no administration in time range)    Mobility walks Low fall risk   Focused Assessments Cardiac Assessment Handoff:  Cardiac Rhythm: Normal sinus rhythm Lab Results  Component Value Date   CKTOTAL 71 10/30/2009   CKMB 2.9 10/30/2009   TROPONINI <0.03 09/29/2018   Lab Results  Component Value Date   DDIMER  10/29/2009    0.27        AT THE INHOUSE ESTABLISHED CUTOFF VALUE OF 0.48  ug/mL FEU, THIS ASSAY HAS BEEN DOCUMENTED IN THE LITERATURE TO HAVE A SENSITIVITY AND NEGATIVE PREDICTIVE VALUE OF AT LEAST 98 TO 99%.  THE TEST RESULT SHOULD BE CORRELATED WITH AN ASSESSMENT OF THE CLINICAL PROBABILITY OF DVT / VTE.   Does the Patient currently have chest pain? No     R Recommendations: See Admitting Provider Note  Report given to:   Additional Notes:

## 2019-04-21 NOTE — ED Notes (Signed)
Patient is hooked up on montior five lead

## 2019-04-21 NOTE — ED Provider Notes (Addendum)
[facility] EMERGENCY DEPARTMENT Provider Note   CSN: [id number] Arrival date & time: 04/21/19  1340    History   Chief Complaint Chief Complaint  Patient presents with  . Shortness of Breath  . Palpitations    HPI Janet Williamson is a 78 y.o. female.     HPI Patient presents to the emergency room for evaluation of shortness of breath.  Patient noticed she started having some shortness of breath last week.  Patient only notices it when she is walking around.  When she is at rest she feels fine.  She denies having any trouble with any cough.  She is not having any fevers.  She is not having any body aches or myalgias.  Patient states she does not have a history of breathing problems but has had trouble with shortness of breath in the past when she was very anemic.  She has a history of intestinal bleeding.  She does still take anticoagulants because she had a stroke in the past.  Patient has noticed some dark-colored stools recently. Past Medical History:  Diagnosis Date  . Anemia   . Cardiac mass    a. 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 Not seen on most recent TEE 2019.  . Cardiomyopathy in other disease   . Cataracts, bilateral   . Diabetes mellitus    Type 2  . Generalized osteoarthritis   . GERD (gastroesophageal reflux disease)   . HLD (hyperlipidemia)   . Hypertension   . Hypothyroidism   . LBBB (left bundle branch block)   . PAF (paroxysmal atrial fibrillation) ([place])    a. s/p DCCV 06/2017, on Xarelto  . Phlebitis   . S/P TAVR (transcatheter aortic valve replacement) 04/15/2018   Edwards Sapien 3 THV (size 23 mm, model # 9600TFX, serial # [id number]) via the TF approach  . Severe aortic stenosis    a. s/p TAVR 04/2018.  . Stroke [facility]) 2008    Patient Active Problem List   Diagnosis Date Noted  . Symptomatic anemia 09/30/2018  . S/P TAVR (transcatheter aortic valve replacement) 04/15/2018  . Severe aortic stenosis   . PAF (paroxysmal  atrial fibrillation) ([place]) 03/04/2018  . History of stroke 11/03/2014  . Hypertension   . DM (diabetes mellitus) ([place]) 05/06/2013  . HTN (hypertension) 05/06/2013  . Anemia 05/06/2013    Past Surgical History:  Procedure Laterality Date  . ABDOMINAL HYSTERECTOMY    . COLONOSCOPY    . COLONOSCOPY WITH PROPOFOL N/A 10/02/2018   Procedure: COLONOSCOPY WITH PROPOFOL;  Surgeon: Arta Silence, MD;  Location: [place];  Service: Endoscopy;  Laterality: N/A;  . ESOPHAGOGASTRODUODENOSCOPY (EGD) WITH PROPOFOL N/A 10/02/2018   Procedure: ESOPHAGOGASTRODUODENOSCOPY (EGD) WITH PROPOFOL;  Surgeon: Arta Silence, MD;  Location: [place];  Service: Endoscopy;  Laterality: N/A;  . EYE SURGERY Bilateral    cataract removal  . GIVENS CAPSULE STUDY N/A 10/02/2018   Procedure: GIVENS CAPSULE STUDY;  Surgeon: Arta Silence, MD;  [facility] END[facility]COPY;  Service: Endoscopy;  Laterality: N/A;  . RIGHT/LEFT HEART CATH AND CORONARY ANGIOGRAPHY N/A 03/06/2018   Procedure: RIGHT/LEFT HEART CATH AND CORONARY ANGIOGRAPHY;  Surgeon: Belva Crome, MD;  Location: [place] CV LAB;  Service: Cardiovascular;  Laterality: N/A;  . TEE WITHOUT CARDIOVERSION N/A 04/01/2018   Procedure: TRANSESOPHAGEAL ECHOCARDIOGRAM (TEE);  Surgeon: Jerline Pain, MD;  Location: [facility] ENDOSCOPY;  Service: Cardiovascular;  Laterality: N/A;  . TEE WITHOUT CARDIOVERSION N/A 04/15/2018   Procedure: TRANSESOPHAGEAL ECHOCARDIOGRAM (TEE);  Surgeon: Burt Knack,  Casimiro NeedleMichael, MD;  [facility];  Service: Open Heart Surgery;  Laterality: N/A;  . TOTAL KNEE ARTHROPLASTY Right 04/14/2013   Dr August Saucerean  . TOTAL KNEE ARTHROPLASTY Right 04/14/2013   Procedure: TOTAL KNEE ARTHROPLASTY;  Surgeon: Cammy CopaGregory Scott Dean, MD;  [facility];  Service: Orthopedics;  Laterality: Right;  . TRANSCATHETER AORTIC VALVE REPLACEMENT, TRANSFEMORAL  04/15/2018  . TRANSCATHETER AORTIC VALVE REPLACEMENT, TRANSFEMORAL Bilateral 04/15/2018   Procedure: TRANSCATHETER AORTIC  VALVE REPLACEMENT, TRANSFEMORAL;  Surgeon: Tonny Bollmanooper, Michael, MD;  [facility];  Service: Open Heart Surgery;  Laterality: Bilateral;     OB History   No obstetric history on file.      Home Medications    Prior to Admission medications   Medication Sig Start Date End Date Taking? Authorizing Provider  amLODipine (NORVASC) 5 MG tablet Take 5 mg by mouth daily.    [provider]  Cholecalciferol (VITAMIN D3) 5000 units CAPS Take 5,000 Units by mouth daily.    [provider]  glucose blood (ACCU-CHEK AVIVA) test strip Use as instructed to check once daily DX: E11.9 07/02/18   Reather LittlerKumar, Ajay, MD  hydrochlorothiazide (HYDRODIURIL) 25 MG tablet Take 12.5 mg by mouth daily.     [provider]  metFORMIN (GLUCOPHAGE-XR) 750 MG 24 hr tablet Take 2 tablets (1,500 mg total) by mouth daily with breakfast. 01/26/19   Reather LittlerKumar, Ajay, MD  metoprolol succinate (TOPROL-XL) 50 MG 24 hr tablet Take 50 mg by mouth daily.     [provider]  pantoprazole (PROTONIX) 40 MG tablet Take 1 tablet (40 mg total) by mouth 2 (two) times daily. 10/04/18   Kathlen ModyAkula, Vijaya, MD  Polyethyl Glycol-Propyl Glycol (SYSTANE OP) Place 1 drop into both eyes 2 (two) times daily.     [provider]  simvastatin (ZOCOR) 20 MG tablet Take 20 mg by mouth every evening.    [provider]  XARELTO 20 MG TABS tablet TAKE 1 TABLET BY MOUTH ONCE DAILY WITH SUPPER 10/16/18   Jake BatheSkains, Mark C, MD    Family History Family History  Problem Relation Age of Onset  . Stroke Mother   . Hypertension Mother   . Hypertension Father   . Heart attack Neg Hx     Social History Social History   Tobacco Use  . Smoking status: Never Smoker  . Smokeless tobacco: Never Used  Substance Use Topics  . Alcohol use: No  . Drug use: No     Allergies   Ace inhibitors, Keflex [cephalexin], and Rifadin [rifampin]   Review of Systems Review of Systems  Constitutional: Negative for fever.   Respiratory: Positive for shortness of breath. Negative for chest tightness.   Cardiovascular: Negative for chest pain.  Gastrointestinal: Negative for abdominal pain and vomiting.  Genitourinary: Negative for hematuria.  Neurological: Negative for dizziness, syncope and numbness.  Psychiatric/Behavioral: Negative for confusion.  All other systems reviewed and are negative.    Physical Exam Updated Vital Signs BP 122/61 (BP Location: Right Arm)   Pulse 97   Temp 98.7 F (37.1 C) (Oral)   Resp 18   SpO2 100%   Physical Exam Vitals signs and nursing note reviewed.  Constitutional:      General: She is not in acute distress.    Appearance: She is well-developed.  HENT:     Head: Normocephalic and atraumatic.     Right Ear: External ear normal.     Left Ear: External ear normal.  Eyes:  General: No scleral icterus.       Right eye: No discharge.        Left eye: No discharge.     Conjunctiva/sclera: Conjunctivae normal.  Neck:     Musculoskeletal: Neck supple.     Trachea: No tracheal deviation.  Cardiovascular:     Rate and Rhythm: Normal rate and regular rhythm.  Pulmonary:     Effort: Pulmonary effort is normal. No respiratory distress.     Breath sounds: Normal breath sounds. No stridor. No wheezing or rales.  Abdominal:     General: Bowel sounds are normal. There is no distension.     Palpations: Abdomen is soft.     Tenderness: There is no abdominal tenderness. There is no guarding or rebound.  Genitourinary:    Comments: Stool is dark in color, no gross blood on rectal exam Musculoskeletal:        General: No tenderness.  Skin:    General: Skin is warm and dry.     Findings: No rash.  Neurological:     Mental Status: She is alert.     Cranial Nerves: No cranial nerve deficit (no facial droop, extraocular movements intact, no slurred speech).     Sensory: No sensory deficit.     Motor: No abnormal muscle tone or seizure activity.     Coordination:  Coordination normal.      ED Treatments / Results  Labs (all labs ordered are listed, but only abnormal results are displayed) Labs Reviewed  CBC - Abnormal; Notable for the following components:      Result Value   WBC 10.9 (*)    RBC 2.87 (*)    Hemoglobin 5.3 (*)    HCT 20.3 (*)    MCV 70.7 (*)    MCH 18.5 (*)    MCHC 26.1 (*)    RDW 24.5 (*)    Platelets 483 (*)    nRBC 0.6 (*)    All other components within normal limits  BASIC METABOLIC PANEL - Abnormal; Notable for the following components:   Glucose, Bld 126 (*)    All other components within normal limits  PROTIME-INR - Abnormal; Notable for the following components:   Prothrombin Time 18.2 (*)    INR 1.5 (*)    All other components within normal limits  POC OCCULT BLOOD, ED - Abnormal; Notable for the following components:   Fecal Occult Bld POSITIVE (*)    All other components within normal limits  SARS CORONAVIRUS 2 (HOSPITAL ORDER, PERFORMED IN [facility])  TYPE AND SCREEN  PREPARE RBC (CROSSMATCH)    EKG None  Radiology Dg Chest 2 View  Result Date: 04/21/2019 CLINICAL DATA:  Shortness of breath for 5 days. EXAM: CHEST - 2 VIEW COMPARISON:  PA and lateral chest 09/29/2018 and 07/06/2017. FINDINGS: The patient is status post aortic valve replacement. Heart size is normal. Aortic atherosclerosis is noted. Lungs are clear. No pneumothorax or pleural effusion. No acute or focal bony abnormality. IMPRESSION: No acute disease. Atherosclerosis. Electronically Signed   By: Drusilla Kannerhomas  Dalessio M.D.   On: 04/21/2019 14:55    Procedures .Critical Care Performed by: Linwood DibblesKnapp, Brailey Buescher, MD Authorized by: Linwood DibblesKnapp, Zian Delair, MD   Critical care provider statement:    Critical care time (minutes):  30   Critical care was time spent personally by me on the following activities:  Discussions with consultants, evaluation of patient's response to treatment, examination of patient, ordering and performing treatments and  interventions,  ordering and review of laboratory studies, ordering and review of radiographic studies, pulse oximetry, re-evaluation of patient's condition, obtaining history from patient or surrogate and review of old charts   (including critical care time)  Medications Ordered in ED Medications  0.9 %  sodium chloride infusion (has no administration in time range)     Initial Impression / Assessment and Plan / ED Course  I have reviewed the triage vital signs and the nursing notes.  Pertinent labs & imaging results that were available during my care of the patient were reviewed by me and considered in my medical decision making (see chart for details).  Clinical Course as of Apr 20 1609  Tue Apr 21, 2019  1527 Patient's hemoglobin is down to 5.3.  1 month ago her hemoglobin was 8.  Stool is dark in color.  Symptoms are concerning for recurrent GI bleed.   [JK]    Clinical Course User Index [JK] Linwood Dibbles, MD     Patient presents with dyspnea on exertion.  Patient is breathing easily on exam.  Oxygen saturation is normal.  Chest x-ray is normal.  Patient's labs are notable for decreased hemoglobin down to 5.3.  This certainly is the cause of her dyspnea on exertion.  Patient's Hemoccult is positive.  She appears to have a recurrent GI bleed.  She is hemodynamically stable.  I will order blood transfusion for symptomatic anemia.  Plan on GI consultation and admission to hospital for further treatment.  Final Clinical Impressions(s) / ED Diagnoses   Final diagnoses:  Gastrointestinal hemorrhage, unspecified gastrointestinal hemorrhage type  Anemia, unspecified type      Linwood Dibbles, MD 04/21/19   D/w Dr Randa Evens.  Will consult on pt.  Plan to seen in am.  Pt is stable.   Linwood Dibbles, MD 04/21/19 [phone]

## 2019-04-21 NOTE — Progress Notes (Signed)
Janet Williamson [id number] Admission Data: 04/21/2019 7:58 PM Attending Provider: Flora Lipps, MD  TKW:[id number], Margaretha Sheffield, MD Consults/ Treatment Team:   Janet Williamson is a 78 y.o. female patient admitted from ED awake, alert  & orientated  X 3,  Full Code, VSS - Blood pressure 123/84, pulse 85, temperature 98.6 F (37 C), temperature source Oral, resp. rate 14, SpO2 98 %.,, no c/o shortness of breath, no c/o chest pain, no distress noted. Tele # [id number] placed and pt is currently running:normal sinus rhythm.   IV site WDL:  antecubital right, condition patent and no redness with a transparent dsg that's clean dry and intact.  Allergies:   Allergies  Allergen Reactions  . Ace Inhibitors Swelling and Other (See Comments)    Angioedema   . Keflex [Cephalexin] Swelling and Other (See Comments)    Lips became swollwn  . Rifadin [Rifampin] Swelling and Other (See Comments)    Lips became swollen     Past Medical History:  Diagnosis Date  . Anemia   . Cardiac mass    a. 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 Not seen on most recent TEE 2019.  . Cardiomyopathy in other disease   . Cataracts, bilateral   . Diabetes mellitus    Type 2  . Generalized osteoarthritis   . GERD (gastroesophageal reflux disease)   . HLD (hyperlipidemia)   . Hypertension   . Hypothyroidism   . LBBB (left bundle branch block)   . PAF (paroxysmal atrial fibrillation) ([place])    a. s/p DCCV 06/2017, on Xarelto  . Phlebitis   . S/P TAVR (transcatheter aortic valve replacement) 04/15/2018   Edwards Sapien 3 THV (size 23 mm, model # 9600TFX, serial # [id number]) via the TF approach  . Severe aortic stenosis    a. s/p TAVR 04/2018.  . Stroke [facility]) 2008    History:  obtained from chart review. Tobacco/alcohol: denied history unreliable  Pt orientation to unit, room and routine. Information packet given to patient/family and safety video watched.  Admission INP armband ID verified with patient/family, and in  place. SR up x 2, fall risk assessment complete with Patient and family verbalizing understanding of risks associated with falls. Pt verbalizes an understanding of how to use the call bell and to call for help before getting out of bed.  Skin, clean-dry- intact without evidence of bruising, or skin tears.   No evidence of skin break down noted on exam. no rashes, no ecchymoses, no petechiae, no nodules, no jaundice, no purpura, no wounds. COVID test complete 30 min prior to arriving on the unit, therefore results are not back yet. Blood consent obtained as it was not completed in the ED prior to arriving on the unit. 2URBC have been ordered but not yet started. Will be given on next shift.   Will cont to monitor and assist as needed.  [name], MSN, RN, [place], [place] 04/21/2019 7:58 PM

## 2019-04-21 NOTE — H&P (Signed)
[facility] History and Physical  Janet Williamson ZLD:[id number] DOB: Feb 11, 1941 DOA: 04/21/2019  Referring physician: ED  PCP: Maurice Small, MD   Chief Complaint: Shortness of breath and palpitations  HPI: Janet Williamson is a 78 y.o. female with past medical history of stroke, history of TAVR, atrial fibrillation on anticoagulation, diabetes mellitus, hypertension, hypothyroidism, hyperlipidemia, presented to hospital with complaints of shortness of breath and palpitation for the last 1 week.  Patient states that her shortness of breath is exertional and is okay at rest.  Denies any fever, chills or rigor.  No recent cough myalgia or body aches.  Patient denies dizziness, lightheadedness. Patient does have history of GI bleeding in the past but is taking anticoagulants because of a stroke.  Patient has been noticing some dark-colored stools recently since Saturday at least 3-4 episodes.  Last episode of black stool was this morning..  Patient denies any urinary urgency, frequency or dysuria.  Denies any nausea, vomiting or abdominal pain.  ED Course: In the ED, patient was noted to be severely anemic with a hemoglobin of 5.3.  Hemoccult was positive.  Type and cross was done in the ED and GI was consulted from the ED as per the ED provider.  Patient was then considered for admission to the hospital under hospitalist service.  Review of Systems:  All systems were reviewed and were negative unless otherwise mentioned in the HPI  Past Medical History:  Diagnosis Date  . Anemia   . Cardiac mass    a. 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 Not seen on most recent TEE 2019.  . Cardiomyopathy in other disease   . Cataracts, bilateral   . Diabetes mellitus    Type 2  . Generalized osteoarthritis   . GERD (gastroesophageal reflux disease)   . HLD (hyperlipidemia)   . Hypertension   . Hypothyroidism   . LBBB (left bundle branch block)   . PAF (paroxysmal atrial fibrillation)  ([facility])    a. s/p DCCV 06/2017, on Xarelto  . Phlebitis   . S/P TAVR (transcatheter aortic valve replacement) 04/15/2018   Edwards Sapien 3 THV (size 23 mm, model # 9600TFX, serial # [id number]) via the TF approach  . Severe aortic stenosis    a. s/p TAVR 04/2018.  . Stroke [facility]) 2008   Past Surgical History:  Procedure Laterality Date  . ABDOMINAL HYSTERECTOMY    . COLONOSCOPY    . COLONOSCOPY WITH PROPOFOL N/A 10/02/2018   Procedure: COLONOSCOPY WITH PROPOFOL;  Surgeon: [name], MD;  Location: [facility] ENDOSCOPY;  Service: Endoscopy;  Laterality: N/A;  . ESOPHAGOGASTRODUODENOSCOPY (EGD) WITH PROPOFOL N/A 10/02/2018   Procedure: ESOPHAGOGASTRODUODENOSCOPY (EGD) WITH PROPOFOL;  Surgeon: [name], MD;  Location: [facility] ENDOSCOPY;  Service: Endoscopy;  Laterality: N/A;  . EYE SURGERY Bilateral    cataract removal  . GIVENS CAPSULE STUDY N/A 10/02/2018   Procedure: GIVENS CAPSULE STUDY;  Surgeon: [name], MD;  Location: [facility] ENDOSCOPY;  Service: Endoscopy;  Laterality: N/A;  . RIGHT/LEFT HEART CATH AND CORONARY ANGIOGRAPHY N/A 03/06/2018   Procedure: RIGHT/LEFT HEART CATH AND CORONARY ANGIOGRAPHY;  Surgeon: Lyn Records, MD;  Location: [facility] INVASIVE CV LAB;  Service: Cardiovascular;  Laterality: N/A;  . TEE WITHOUT CARDIOVERSION N/A 04/01/2018   Procedure: TRANSESOPHAGEAL ECHOCARDIOGRAM (TEE);  Surgeon: Jake Bathe, MD;  Location: [facility] ENDOSCOPY;  Service: Cardiovascular;  Laterality: N/A;  . TEE WITHOUT CARDIOVERSION N/A 04/15/2018   Procedure: TRANSESOPHAGEAL ECHOCARDIOGRAM (TEE);  Surgeon: Tonny Bollman, MD;  Location: [facility] OR;  Service: Open Heart Surgery;  Laterality: N/A;  . TOTAL KNEE ARTHROPLASTY Right 04/14/2013   Dr Marlou Sa  . TOTAL KNEE ARTHROPLASTY Right 04/14/2013   Procedure: TOTAL KNEE ARTHROPLASTY;  Surgeon: Meredith Pel, MD;  Location: [place];  Service: Orthopedics;  Laterality: Right;  . TRANSCATHETER AORTIC VALVE REPLACEMENT, TRANSFEMORAL  04/15/2018  . TRANSCATHETER  AORTIC VALVE REPLACEMENT, TRANSFEMORAL Bilateral 04/15/2018   Procedure: TRANSCATHETER AORTIC VALVE REPLACEMENT, TRANSFEMORAL;  Surgeon: Sherren Mocha, MD;  Location: [place];  Service: Open Heart Surgery;  Laterality: Bilateral;    Social History:  reports that she has never smoked. She has never used smokeless tobacco. She reports that she does not drink alcohol or use drugs.  Allergies  Allergen Reactions  . Ace Inhibitors Other (See Comments)    angioedema  . Keflex [Cephalexin] Swelling and Other (See Comments)    lips  . Rifadin [Rifampin] Swelling and Other (See Comments)    lips    Family History  Problem Relation Age of Onset  . Stroke Mother   . Hypertension Mother   . Hypertension Father   . Heart attack Neg Hx      Prior to Admission medications   Medication Sig Start Date End Date Taking? Authorizing Provider  amLODipine (NORVASC) 5 MG tablet Take 5 mg by mouth daily.    [provider]  Cholecalciferol (VITAMIN D3) 5000 units CAPS Take 5,000 Units by mouth daily.    [provider]  glucose blood (ACCU-CHEK AVIVA) test strip Use as instructed to check once daily DX: E11.9 07/02/18   Elayne Snare, MD  hydrochlorothiazide (HYDRODIURIL) 25 MG tablet Take 12.5 mg by mouth daily.     [provider]  metFORMIN (GLUCOPHAGE-XR) 750 MG 24 hr tablet Take 2 tablets (1,500 mg total) by mouth daily with breakfast. 01/26/19   Elayne Snare, MD  metoprolol succinate (TOPROL-XL) 50 MG 24 hr tablet Take 50 mg by mouth daily.     [provider]  pantoprazole (PROTONIX) 40 MG tablet Take 1 tablet (40 mg total) by mouth 2 (two) times daily. 10/04/18   Hosie Poisson, MD  Polyethyl Glycol-Propyl Glycol (SYSTANE OP) Place 1 drop into both eyes 2 (two) times daily.     [provider]  simvastatin (ZOCOR) 20 MG tablet Take 20 mg by mouth every evening.    [provider]  XARELTO 20 MG TABS tablet TAKE 1 TABLET BY MOUTH ONCE DAILY WITH SUPPER  10/16/18   Jerline Pain, MD    Physical Exam: Vitals:   04/21/19 1345  BP: 122/61  Pulse: 97  Resp: 18  Temp: 98.7 F (37.1 C)  TempSrc: Oral  SpO2: 100%   Wt Readings from Last 3 Encounters:  03/11/19 85.3 kg  10/30/18 86.4 kg  10/17/18 85.2 kg   There is no height or weight on file to calculate BMI.  General:  Average built, not in obvious distress HENT: Normocephalic, pupils equally reacting to light and accommodation.  Pallor noted.  Oral mucosa is moist.  Chest:  Clear breath sounds.  Diminished breath sounds bilaterally. No crackles or wheezes.  CVS: S1 &S2 heard. No murmur.  Regular rate and rhythm. Abdomen: Soft, nontender, nondistended.  Bowel sounds are heard.  Liver is not palpable, no abdominal mass palpated.  Rectal examination performed by ED provider was reported as dark stool. Extremities: No cyanosis, clubbing or edema.  Peripheral pulses are palpable. Psych: Alert, awake and oriented, normal mood CNS:  No cranial nerve deficits.  Power equal in all extremities.   No cerebellar signs.   Skin: Warm and dry.  No rashes noted.  Labs on Admission:   CBC: Recent Labs  Lab 04/21/19 1358  WBC 10.9*  HGB 5.3*  HCT 20.3*  MCV 70.7*  PLT 483*    Basic Metabolic Panel: Recent Labs  Lab 04/21/19 1358  NA 137  K 4.2  CL 99  CO2 25  GLUCOSE 126*  BUN 18  CREATININE 0.79  CALCIUM 9.8    Liver Function Tests: No results for input(s): AST, ALT, ALKPHOS, BILITOT, PROT, ALBUMIN in the last 168 hours. No results for input(s): LIPASE, AMYLASE in the last 168 hours. No results for input(s): AMMONIA in the last 168 hours.  Cardiac Enzymes: No results for input(s): CKTOTAL, CKMB, CKMBINDEX, TROPONINI in the last 168 hours.  BNP (last 3 results) Recent Labs    09/29/18 2148  BNP 154.7*    ProBNP (last 3 results) No results for input(s): PROBNP in the last 8760 hours.  CBG: No results for input(s): GLUCAP in the last 168 hours.  Lipase      Component Value Date/Time   LIPASE 23 09/29/2018 2148     Urinalysis    Component Value Date/Time   COLORURINE YELLOW 10/30/2018 0936   APPEARANCEUR Sl Cloudy (A) 10/30/2018 0936   LABSPEC 1.020 10/30/2018 0936   PHURINE 5.5 10/30/2018 0936   GLUCOSEU NEGATIVE 10/30/2018 0936   HGBUR NEGATIVE 10/30/2018 0936   BILIRUBINUR NEGATIVE 10/30/2018 0936   KETONESUR NEGATIVE 10/30/2018 0936   PROTEINUR NEGATIVE 09/30/2018 0013   UROBILINOGEN 0.2 10/30/2018 0936   NITRITE NEGATIVE 10/30/2018 0936   LEUKOCYTESUR NEGATIVE 10/30/2018 0936     Drugs of Abuse  No results found for: LABOPIA, COCAINSCRNUR, LABBENZ, AMPHETMU, THCU, LABBARB    Radiological Exams on Admission: Dg Chest 2 View  Result Date: 04/21/2019 CLINICAL DATA:  Shortness of breath for 5 days. EXAM: CHEST - 2 VIEW COMPARISON:  PA and lateral chest 09/29/2018 and 07/06/2017. FINDINGS: The patient is status post aortic valve replacement. Heart size is normal. Aortic atherosclerosis is noted. Lungs are clear. No pneumothorax or pleural effusion. No acute or focal bony abnormality. IMPRESSION: No acute disease. Atherosclerosis. Electronically Signed   By: Drusilla Kannerhomas  Dalessio M.D.   On: 04/21/2019 14:55    EKG: Personally reviewed by me which shows normal sinus rhythm, nonspecific intraventricular block.  Assessment/Plan Active Problems:   GI bleed  GI bleed, melena.  Patient does have history of GI bleed in the past.  Currently on anticoagulation.  Will transfuse II units of packed RBC.  I have spoken with the patient and she has consented for blood transfusion.  Will put the patient on IV Protonix twice a day.  Keep n.p.o. after midnight.  Clear liquids for now.  GI has been notified from the ED.  Will follow GI recommendations.  Hold Xarelto.  Significant symptomatic anemia with dyspnea on exertion secondary to GI bleed.  We will transfused units of packed RBC today.  History of atrial fibrillation, stroke, TAVR currently on  anticoagulation.  Hold anticoagulation for now due to current GI bleed.  Diabetes mellitus type 2.  We will keep the the patient on sliding scale insulin Accu-Cheks diabetic diet.  Hold OHA.  Essential hypertension.  Hold hydrochlorothiazide.  Continue amlodipine and metoprolol.   Consultant: GI has been notified  Code Status: Full code.  Spoke with patient at bedside.  DVT Prophylaxis: SCD  Antibiotics: None  Family Communication:  Patients'  condition and plan of care including tests being ordered have been discussed with the patient  who indicate understanding and agree with the plan.  She stated that she will relay it to the family.  Disposition Plan: Home likely in 2 to 3 days.  Severity of Illness: The appropriate patient status for this patient is INPATIENT. Inpatient status is judged to be reasonable and necessary in order to provide the required intensity of service to ensure the patient's safety. The patient's presenting symptoms, physical exam findings, and initial radiographic and laboratory data in the context of their chronic comorbidities is felt to place them at high risk for further clinical deterioration. Furthermore, it is not anticipated that the patient will be medically stable for discharge from the hospital within 2 midnights of admission. I certify that at the point of admission it is my clinical judgment that the patient will require inpatient hospital care spanning beyond 2 midnights from the point of admission due to high intensity of service, high risk for further deterioration and high frequency of surveillance required.   Signed, Joycelyn DasLaxman Emali Heyward, MD [facility] 04/21/2019

## 2019-04-21 NOTE — ED Notes (Signed)
Attempt to call report to 5W and charge would like to wait until Covid resulted  Pt has no covid sx, SOB d/t low HGB.  Chrage RN to review chart and call them back.

## 2019-04-21 NOTE — ED Triage Notes (Signed)
Pt reports SOB and palpitations since last Thursday, denies CP, cough or fever, resp e/u at this time, nad noted.

## 2019-04-21 NOTE — ED Notes (Signed)
Pt in xray and they will bring pt to room 17 once same is complete.

## 2019-04-22 DIAGNOSIS — I480 Paroxysmal atrial fibrillation: Secondary | ICD-10-CM

## 2019-04-22 DIAGNOSIS — Z794 Long term (current) use of insulin: Secondary | ICD-10-CM

## 2019-04-22 DIAGNOSIS — E0800 Diabetes mellitus due to underlying condition with hyperosmolarity without nonketotic hyperglycemic-hyperosmolar coma (NKHHC): Secondary | ICD-10-CM

## 2019-04-22 LAB — CBC
HCT: 24.5 % — ABNORMAL LOW (ref 36.0–46.0)
Hemoglobin: 7.6 g/dL — ABNORMAL LOW (ref 12.0–15.0)
MCH: 23.1 pg — ABNORMAL LOW (ref 26.0–34.0)
MCHC: 31 g/dL (ref 30.0–36.0)
MCV: 74.5 fL — ABNORMAL LOW (ref 80.0–100.0)
Platelets: 376 10*3/uL (ref 150–400)
RBC: 3.29 MIL/uL — ABNORMAL LOW (ref 3.87–5.11)
RDW: 23.6 % — ABNORMAL HIGH (ref 11.5–15.5)
WBC: 10.7 10*3/uL — ABNORMAL HIGH (ref 4.0–10.5)
nRBC: 0.6 % — ABNORMAL HIGH (ref 0.0–0.2)

## 2019-04-22 LAB — URINE CULTURE

## 2019-04-22 MED ORDER — SODIUM CHLORIDE 0.9 % IV SOLN
INTRAVENOUS | Status: DC
  Administered 2019-04-22: 17:00:00 via INTRAVENOUS

## 2019-04-22 NOTE — Progress Notes (Signed)
OT Cancellation Note  Patient Details Name: Janet Williamson MRN: [id number] DOB: 01/23/1941   Cancelled Treatment:    Reason Eval/Treat Not Completed: OT screened, no needs identified, will sign off. Per PT note, pt walked 150' independently during PT session, PT reports pt at baseline level of functioning. No OT needs identified at this time. OT signing off.   Tyrone Schimke, OT Acute Rehabilitation Services Pager: [phone] Office: [phone]  04/22/2019, 12:39 PM

## 2019-04-22 NOTE — Consult Note (Signed)
[facility] CONSULT Reason for consult: GI bleed and anemia Referring Physician: [facility] hospitalist.  PCP: Dr. Kelton Pillar.  Primary GI: Dr. Shelbie Hutching Janet Williamson is an 78 y.o. female.  HPI: She has a past history of strokes, transcatherter AVR on chronic anticoagulation with Xarelto followed by Dr. Marlou Porch.  She was hospitalized 12/19 with a GI bleed and underwent EGD, colonoscopy both of which were normal but Givens capsule study showed small ulceration in the second duodenum.  The patient was discharged on Protonix but apparently did not have refills on her prescription and did not know she had to continue to take.  She took it for 1 month after discharge and not since then.  She is is on chronic Xarelto because of her valve replacement and a history of PAF.  She states that last week she began to have dark tarry stools and came into the hospital with hemoglobin of 5.3.  She denies any abdominal pain and denies taking any NSAIDs.  Her last dose of Xarelto was late Monday night a little over 1 day ago.  Past Medical History:  Diagnosis Date  . Anemia   . Cardiac mass    a. 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 Not seen on most recent TEE 2019.  . Cardiomyopathy in other disease   . Cataracts, bilateral   . Diabetes mellitus    Type 2  . Generalized osteoarthritis   . GERD (gastroesophageal reflux disease)   . HLD (hyperlipidemia)   . Hypertension   . Hypothyroidism   . LBBB (left bundle branch block)   . PAF (paroxysmal atrial fibrillation) ([place])    a. s/p DCCV 06/2017, on Xarelto  . Phlebitis   . S/P TAVR (transcatheter aortic valve replacement) 04/15/2018   Janet Williamson Sapien 3 THV (size 23 mm, model # [id number], serial # [id number]) via the TF approach  . Severe aortic stenosis    a. s/p TAVR 04/2018.  . Stroke [facility]) 2008    Past Surgical History:  Procedure Laterality Date  . ABDOMINAL HYSTERECTOMY    . COLONOSCOPY    . COLONOSCOPY WITH PROPOFOL N/A 10/02/2018   Procedure: COLONOSCOPY WITH PROPOFOL;  Surgeon: Arta Silence, MD;  Location: [place];  Service: Endoscopy;  Laterality: N/A;  . ESOPHAGOGASTRODUODENOSCOPY (EGD) WITH PROPOFOL N/A 10/02/2018   Procedure: ESOPHAGOGASTRODUODENOSCOPY (EGD) WITH PROPOFOL;  Surgeon: Arta Silence, MD;  Location: [place];  Service: Endoscopy;  Laterality: N/A;  . EYE SURGERY Bilateral    cataract removal  . GIVENS CAPSULE STUDY N/A 10/02/2018   Procedure: GIVENS CAPSULE STUDY;  Surgeon: Arta Silence, MD;  Location: [facility] ENDOSCOPY;  Service: Endoscopy;  Laterality: N/A;  . RIGHT/LEFT HEART CATH AND CORONARY ANGIOGRAPHY N/A 03/06/2018   Procedure: RIGHT/LEFT HEART CATH AND CORONARY ANGIOGRAPHY;  Surgeon: Belva Crome, MD;  Location: [place] CV LAB;  Service: Cardiovascular;  Laterality: N/A;  . TEE WITHOUT CARDIOVERSION N/A 04/01/2018   Procedure: TRANSESOPHAGEAL ECHOCARDIOGRAM (TEE);  Surgeon: Jerline Pain, MD;  Location: [facility] ENDOSCOPY;  Service: Cardiovascular;  Laterality: N/A;  . TEE WITHOUT CARDIOVERSION N/A 04/15/2018   Procedure: TRANSESOPHAGEAL ECHOCARDIOGRAM (TEE);  Surgeon: Sherren Mocha, MD;  Location: [place];  Service: Open Heart Surgery;  Laterality: N/A;  . TOTAL KNEE ARTHROPLASTY Right 04/14/2013   Dr Marlou Sa  . TOTAL KNEE ARTHROPLASTY Right 04/14/2013   Procedure: TOTAL KNEE ARTHROPLASTY;  Surgeon: Meredith Pel, MD;  Location: [place];  Service: Orthopedics;  Laterality: Right;  . TRANSCATHETER AORTIC VALVE REPLACEMENT, TRANSFEMORAL  04/15/2018  .  TRANSCATHETER AORTIC VALVE REPLACEMENT, TRANSFEMORAL Bilateral 04/15/2018   Procedure: TRANSCATHETER AORTIC VALVE REPLACEMENT, TRANSFEMORAL;  Surgeon: Tonny Bollmanooper, Michael, MD;  [facility];  Service: Open Heart Surgery;  Laterality: Bilateral;    Family History  Problem Relation Age of Onset  . Stroke Mother   . Hypertension Mother   . Hypertension Father   . Heart attack Neg Hx     Social History:  reports that she has never smoked.  She has never used smokeless tobacco. She reports that she does not drink alcohol or use drugs.  Allergies:  Allergies  Allergen Reactions  . Ace Inhibitors Swelling and Other (See Comments)    Angioedema   . Keflex [Cephalexin] Swelling and Other (See Comments)    Lips became swollwn  . Rifadin [Rifampin] Swelling and Other (See Comments)    Lips became swollen    Medications; Prior to Admission medications   Medication Sig Start Date End Date Taking? Authorizing Provider  acetaminophen (TYLENOL) 325 MG tablet Take 325-650 mg by mouth every 8 (eight) hours as needed (for headaches).    Yes [provider]  amLODipine (NORVASC) 5 MG tablet Take 5 mg by mouth daily.   Yes [provider]  calcium carbonate (TUMS - DOSED IN MG ELEMENTAL CALCIUM) 500 MG chewable tablet Chew 1-2 tablets by mouth as needed for indigestion or heartburn.   Yes [provider]  Cholecalciferol (VITAMIN D3) 5000 units CAPS Take 5,000 Units by mouth daily.   Yes [provider]  hydrochlorothiazide (HYDRODIURIL) 25 MG tablet Take 25 mg by mouth daily.    Yes [provider]  metFORMIN (GLUCOPHAGE-XR) 750 MG 24 hr tablet Take 2 tablets (1,500 mg total) by mouth daily with breakfast. 01/26/19  Yes Reather LittlerKumar, Ajay, MD  metoprolol succinate (TOPROL-XL) 50 MG 24 hr tablet Take 50 mg by mouth daily.    Yes [provider]  Polyethyl Glycol-Propyl Glycol (SYSTANE OP) Place 1 drop into both eyes 2 (two) times daily.    Yes [provider]  simvastatin (ZOCOR) 20 MG tablet Take 20 mg by mouth every evening.   Yes [provider]  XARELTO 20 MG TABS tablet TAKE 1 TABLET BY MOUTH ONCE DAILY WITH SUPPER Patient taking differently: Take 20 mg by mouth every evening. High risk med: Anticoagulant.  Crushed Xarelto can be given down a G-tube but NOT a J-Tube. 10/16/18  Yes Jake BatheSkains, Mark C, MD  glucose blood (ACCU-CHEK AVIVA) test strip Use as instructed to check once  daily DX: E11.9 07/02/18   Reather LittlerKumar, Ajay, MD  pantoprazole (PROTONIX) 40 MG tablet Take 1 tablet (40 mg total) by mouth 2 (two) times daily. Patient not taking: Reported on 04/21/2019 10/04/18   Kathlen ModyAkula, Vijaya, MD   . amLODipine  5 mg Oral Daily  . metoprolol succinate  50 mg Oral Daily  . pantoprazole (PROTONIX) IV  40 mg Intravenous Q12H  . simvastatin  20 mg Oral QPM   PRN Meds acetaminophen, levalbuterol, ondansetron **OR** ondansetron (ZOFRAN) IV Results for orders placed or performed during the hospital encounter of 04/21/19 (from the past 48 hour(s))  CBC     Status: Abnormal   Collection Time: 04/21/19  1:58 PM  Result Value Ref Range   WBC 10.9 (H) 4.0 - 10.5 K/uL   RBC 2.87 (L) 3.87 - 5.11 MIL/uL   Hemoglobin 5.3 (LL) 12.0 - 15.0 g/dL    Comment: Reticulocyte Hemoglobin testing may be clinically indicated, consider ordering this additional test [id number] REPEATED TO  VERIFY CRITICAL RESULT CALLED TO, READ BACK BY AND VERIFIED WITH: M SCRUGGS,RN 1436 04/21/2019 D BRADLEY    HCT 20.3 (L) 36.0 - 46.0 %   MCV 70.7 (L) 80.0 - 100.0 fL   MCH 18.5 (L) 26.0 - 34.0 pg   MCHC 26.1 (L) 30.0 - 36.0 g/dL   RDW 16.124.5 (H) 09.611.5 - 04.515.5 %   Platelets 483 (H) 150 - 400 K/uL   nRBC 0.6 (H) 0.0 - 0.2 %    Comment: Performed at [facility], [address] [place], [place] [address]  Basic metabolic panel     Status: Abnormal   Collection Time: 04/21/19  1:58 PM  Result Value Ref Range   Sodium 137 135 - 145 mmol/L   Potassium 4.2 3.5 - 5.1 mmol/L   Chloride 99 98 - 111 mmol/L   CO2 25 22 - 32 mmol/L   Glucose, Bld 126 (H) 70 - 99 mg/dL   BUN 18 8 - 23 mg/dL   Creatinine, Ser 1.910.79 0.44 - 1.00 mg/dL   Calcium 9.8 8.9 - 47.810.3 mg/dL   GFR calc non Af Amer >60 >60 mL/min   GFR calc Af Amer >60 >60 mL/min   Anion gap 13 5 - 15    Comment: Performed at [facility], [address] [place], [place] [address]  Type and screen [facility]     Status: None (Preliminary  result)   Collection Time: 04/21/19  3:00 PM  Result Value Ref Range   ABO/RH(D) O POS    Antibody Screen POS    Sample Expiration 04/24/2019,2359    Antibody Identification ANTI K    DAT, IgG NEG    Unit Number [id number]    Blood Component Type RED CELLS,LR    Unit division 00    Status of Unit ALLOCATED    Donor AG Type NEGATIVE FOR KELL ANTIGEN    Transfusion Status OK TO TRANSFUSE    Crossmatch Result COMPATIBLE    Unit Number [id number]    Blood Component Type RED CELLS,LR    Unit division 00    Status of Unit ALLOCATED    Donor AG Type NEGATIVE FOR KELL ANTIGEN    Transfusion Status OK TO TRANSFUSE    Crossmatch Result COMPATIBLE    Unit Number [id number]    Blood Component Type RED CELLS,LR    Unit division 00    Status of Unit ISSUED    Donor AG Type NEGATIVE FOR KELL ANTIGEN    Transfusion Status OK TO TRANSFUSE    Crossmatch Result COMPATIBLE    Unit Number [id number]    Blood Component Type RED CELLS,LR    Unit division 00    Status of Unit ISSUED    Donor AG Type NEGATIVE FOR KELL ANTIGEN    Transfusion Status OK TO TRANSFUSE    Crossmatch Result COMPATIBLE   Prepare RBC     Status: None   Collection Time: 04/21/19  3:00 PM  Result Value Ref Range   Order Confirmation      ORDER PROCESSED BY BLOOD BANK Performed at [facility], [address] [place], [place] [address]   Protime-INR     Status: Abnormal   Collection Time: 04/21/19  3:25 PM  Result Value Ref Range   Prothrombin Time 18.2 (H) 11.4 - 15.2 seconds   INR 1.5 (H) 0.8 - 1.2    Comment: (NOTE) INR goal varies based on device and disease states.  Performed at [facility], [address] [place], [place] [address]   POC occult blood, ED RN will collect     Status: Abnormal   Collection Time: 04/21/19  3:38 PM  Result Value Ref Range   Fecal Occult Bld POSITIVE (A) NEGATIVE  SARS Coronavirus 2 (CEPHEID - Performed in [facility] hospital lab), Hosp Order      Status: None   Collection Time: 04/21/19  5:35 PM   Specimen: Nasopharyngeal Swab  Result Value Ref Range   SARS Coronavirus 2 NEGATIVE NEGATIVE    Comment: (NOTE) If result is NEGATIVE SARS-CoV-2 target nucleic acids are NOT DETECTED. The SARS-CoV-2 RNA is generally detectable in upper and lower  respiratory specimens during the acute phase of infection. The lowest  concentration of SARS-CoV-2 viral copies this assay can detect is 250  copies / mL. A negative result does not preclude SARS-CoV-2 infection  and should not be used as the sole basis for treatment or other  patient management decisions.  A negative result may occur with  improper specimen collection / handling, submission of specimen other  than nasopharyngeal swab, presence of viral mutation(s) within the  areas targeted by this assay, and inadequate number of viral copies  (<250 copies / mL). A negative result must be combined with clinical  observations, patient history, and epidemiological information. If result is POSITIVE SARS-CoV-2 target nucleic acids are DETECTED. The SARS-CoV-2 RNA is generally detectable in upper and lower  respiratory specimens dur ing the acute phase of infection.  Positive  results are indicative of active infection with SARS-CoV-2.  Clinical  correlation with patient history and other diagnostic information is  necessary to determine patient infection status.  Positive results do  not rule out bacterial infection or co-infection with other viruses. If result is PRESUMPTIVE POSTIVE SARS-CoV-2 nucleic acids MAY BE PRESENT.   A presumptive positive result was obtained on the submitted specimen  and confirmed on repeat testing.  While 2019 novel coronavirus  (SARS-CoV-2) nucleic acids may be present in the submitted sample  additional confirmatory testing may be necessary for epidemiological  and / or clinical management purposes  to differentiate between  SARS-CoV-2 and other Sarbecovirus  currently known to infect humans.  If clinically indicated additional testing with an alternate test  methodology [id number]) is advised. The SARS-CoV-2 RNA is generally  detectable in upper and lower respiratory sp ecimens during the acute  phase of infection. The expected result is Negative. Fact Sheet for Patients:  BoilerBrush.com.cyhttps://www.fda.gov/media/136312/download Fact Sheet for Healthcare Providers: https://pope.com/https://www.fda.gov/media/136313/download This test is not yet approved or cleared by the [place] FDA and has been authorized for detection and/or diagnosis of SARS-CoV-2 by FDA under an Emergency Use Authorization (EUA).  This EUA will remain in effect (meaning this test can be used) for the duration of the COVID-19 declaration under Section 564(b)(1) of the Act, 21 U.S.C. section 360bbb-3(b)(1), unless the authorization is terminated or revoked sooner. Performed at [facility], [address] [place], [place] [address]   MRSA PCR Screening     Status: None   Collection Time: 04/21/19  6:29 PM   Specimen: Nasal Mucosa; Nasopharyngeal  Result Value Ref Range   MRSA by PCR NEGATIVE NEGATIVE    Comment:        The GeneXpert MRSA Assay (FDA approved for NASAL specimens only), is one component of a comprehensive MRSA colonization surveillance program. It is not intended to diagnose MRSA infection nor to guide or monitor  treatment for MRSA infections. Performed at [facility], [address] [place], [place] [address]     Dg Chest 2 View  Result Date: 04/21/2019 CLINICAL DATA:  Shortness of breath for 5 days. EXAM: CHEST - 2 VIEW COMPARISON:  PA and lateral chest 09/29/2018 and 07/06/2017. FINDINGS: The patient is status post aortic valve replacement. Heart size is normal. Aortic atherosclerosis is noted. Lungs are clear. No pneumothorax or pleural effusion. No acute or focal bony abnormality. IMPRESSION: No acute disease. Atherosclerosis. Electronically Signed   By:  Drusilla Kanner M.D.   On: 04/21/2019 14:55               Blood pressure (!) 125/56, pulse 83, temperature 98.9 F (37.2 C), temperature source Oral, resp. rate (!) 21, height 5\' 4"  (1.626 m), weight 81.2 kg, SpO2 97 %.  Physical exam:   General--Pleasant African-American female ENT--nonicteric Neck--supple without any lymphadenopathy Heart--normal S1 and S2 appears to be in sinus rhythm Lungs--clear, no wheezing or rales Abdomen--soft and nontender Psych--alert and oriented answers questions appropriately  Assessment: 1.  GI bleed.  6 months ago she had complete evaluation with small ulceration seen in the distal duodenum.  She only took the Protonix for 1 month and then stopped it.  Think we need to be sure that she does not have recurrent ulceration.  Her last dose of Xarelto was only a little over 1 day ago side wait another day before any evaluation 2.  Chronic anticoagulation.  This is due to PAF and history of TAVR  Plan: 1.  We will allow clear liquids today and keep her off of the Xarelto. 2.  We will plan EGD tomorrow morning time to be determined.  Have discussed this with patient.   Tresea Mall 04/22/2019, 7:00 AM   This note was created using voice recognition software and minor errors may Have occurred unintentionally. Pager: [phone] If no answer or after hours call [phone]

## 2019-04-22 NOTE — Progress Notes (Addendum)
PROGRESS NOTE    Janet Williamson  UJW:[id number] DOB: 1940/12/12 DOA: 04/21/2019 PCP: Janet Pillar, MD    Brief Narrative:  78 year old female who presented with dyspnea and palpitations.  She does have significant past history of CVA, TAVR, atrial fibrillation, type 2 diabetes mellitus, hypertension, hypothyroidism and dyslipidemia.  She reported dyspnea for about 7 days.  Positive dark stools for about 4 days, worsening.  On her initial physical examination blood pressure 122/61, pulse rate 97, respirate 18, temperature 98.7, oxygen saturation 1%.  Her lungs are clear to auscultation bilaterally, heart S1-S2 present with me, abdomen soft, no lower extremity edema. Sodium 137, potassium 4.2, chloride 99, bicarb 25, glucose 126, BUN 18, creatinine 0.79, white count 10.9, hemoglobin 5.3, hematocrit 20.3, platelets 483.  INR 1.5.  Her chest x-ray was negative for infiltrates.  EKG 93 bpm, left axis deviation, left bundle branch block, sinus rhythm, J-point elevation V1 to V4, ST segment depression V5- V6, T wave inversions lead I aVL, poor R wave progression.  Patient was admitted to the hospital and diagnosis of acute blood loss anemia due to upper GI bleed.  Assessment & Plan:   Principal Problem:   GI bleed Active Problems:   DM (diabetes mellitus) (HCC)   HTN (hypertension)   Anemia   History of stroke   PAF (paroxysmal atrial fibrillation) ([facility])   Severe aortic stenosis   S/P TAVR (transcatheter aortic valve replacement)   1. Acute blood loss anemia due to upper GI bleed. Patient is sp 2 units prbc transfusion, with good toleration, no dyspnea or chest pain. Hgb this am up to 7,6 and Hct at 24.5. Continue proton pump inhibitor therapy, further work up with upper endoscopy in am. Dc IV fluids.   Check cell count in am, transfer to medical telemetry.   2. Paroxysmal atrial fibrillation. Continue rate control with metoprolol and telemetry monitoring. Off anticoagulation due to  acute GI bleeding.   3. T2DM. Continue glucose cover and monitoring. Fasting glucose is 126 on clear liquids today, fasting glucose this am, will hold on insulin therapy for now.   4. Severe aortic stenosis/ sp TAVR. Patient is hemodynamically stable, will continue to hold on anticoagulation.   5. HTN. Will hold amlodipine and HCTZ, continue metoprolol for now.   6. Hx CVA. Continue neuro checks per unit protocol.   7. Obesity. Calculated BMI is 30,7  DVT prophylaxis: scd   Code Status:  full Family Communication: no family at the bedside  Disposition Plan/ discharge barriers: pending upper endoscopy.   Body mass index is 30.73 kg/m. Malnutrition Type:      Malnutrition Characteristics:      Nutrition Interventions:     RN Pressure Injury Documentation:     Consultants:   GI   Procedures:     Antimicrobials:       Subjective: Patient is feeling better after PRBC transfusion, but not yet back to baseline, no nausea or vomiting, no fever of chills. Has intermittent left neck pain.   Objective: Vitals:   04/22/19 0844 04/22/19 0854 04/22/19 0855 04/22/19 1053  BP:  98/75 (!) 116/59 (!) 117/44  Pulse:  83 73 74  Resp:  18 18 18   Temp: 98.3 F (36.8 C)     TempSrc: Oral     SpO2:  99% 98% 97%  Weight:      Height:        Intake/Output Summary (Last 24 hours) at 04/22/2019 1300 Last data filed at 04/22/2019 1210 Gross  per 24 hour  Intake 2529.88 ml  Output 500 ml  Net 2029.88 ml   Filed Weights   04/21/19 2020 04/22/19 0500  Weight: 83.6 kg 81.2 kg    Examination:   General: deconditioned and ill looking appearing Neurology: Awake and alert, non focal  E ENT: mild pallor, no icterus, oral mucosa moist Cardiovascular: No JVD. S1-S2 present, rhythmic, no gallops, rubs, positive systolic murmur at the base 3/6 radiated to the carotids. No lower extremity edema. Pulmonary: positive breath sounds bilaterally, adequate air movement, no wheezing,  rhonchi or rales. Gastrointestinal. Abdomen mild distended, no organomegaly, non tender, no rebound or guarding Skin. No rashes Musculoskeletal: no joint deformities     Data Reviewed: I have personally reviewed following labs and imaging studies  CBC: Recent Labs  Lab 04/21/19 1358 04/22/19 0715  WBC 10.9* 10.7*  HGB 5.3* 7.6*  HCT 20.3* 24.5*  MCV 70.7* 74.5*  PLT 483* 376   Basic Metabolic Panel: Recent Labs  Lab 04/21/19 1358  NA 137  K 4.2  CL 99  CO2 25  GLUCOSE 126*  BUN 18  CREATININE 0.79  CALCIUM 9.8   GFR: Estimated Creatinine Clearance: 59.7 mL/min (by C-G formula based on SCr of 0.79 mg/dL). Liver Function Tests: No results for input(s): AST, ALT, ALKPHOS, BILITOT, PROT, ALBUMIN in the last 168 hours. No results for input(s): LIPASE, AMYLASE in the last 168 hours. No results for input(s): AMMONIA in the last 168 hours. Coagulation Profile: Recent Labs  Lab 04/21/19 1525  INR 1.5*   Cardiac Enzymes: No results for input(s): CKTOTAL, CKMB, CKMBINDEX, TROPONINI in the last 168 hours. BNP (last 3 results) No results for input(s): PROBNP in the last 8760 hours. HbA1C: No results for input(s): HGBA1C in the last 72 hours. CBG: No results for input(s): GLUCAP in the last 168 hours. Lipid Profile: No results for input(s): CHOL, HDL, LDLCALC, TRIG, CHOLHDL, LDLDIRECT in the last 72 hours. Thyroid Function Tests: No results for input(s): TSH, T4TOTAL, FREET4, T3FREE, THYROIDAB in the last 72 hours. Anemia Panel: No results for input(s): VITAMINB12, FOLATE, FERRITIN, TIBC, IRON, RETICCTPCT in the last 72 hours.    Radiology Studies: I have reviewed all of the imaging during this hospital visit personally     Scheduled Meds: . metoprolol succinate  50 mg Oral Daily  . pantoprazole (PROTONIX) IV  40 mg Intravenous Q12H  . simvastatin  20 mg Oral QPM   Continuous Infusions: . sodium chloride 100 mL/hr at 04/22/19 0725     LOS: 1 day         Janet Avera Annett Gula, MD

## 2019-04-22 NOTE — Evaluation (Signed)
Physical Therapy Evaluation Patient Details Name: Janet Williamson MRN: [id number] DOB: 10/01/1941 Today's Date: 04/22/2019   History of Present Illness  78 y.o. female with past medical history of stroke, history of TAVR, atrial fibrillation on anticoagulation, diabetes mellitus, hypertension, hypothyroidism, hyperlipidemia, presented to hospital with complaints of shortness of breath and palpitation for the last 1 week.  Patient states that her shortness of breath is exertional and is okay at rest.  Denies any fever, chills or rigor.  No recent cough myalgia or body aches.  Patient denies dizziness, lightheadedness. Patient does have history of GI bleeding in the past but is taking anticoagulants because of a stroke.  Patient has been noticing some dark-colored stools recently since Saturday at least 3-4 episodes.  Last episode of black stool was this morning.  Clinical Impression  Pt appears at baseline level of functioning, no acute PT needs identified.    Follow Up Recommendations No PT follow up    Equipment Recommendations  None recommended by PT    Recommendations for Other Services       Precautions / Restrictions Precautions Precautions: None Restrictions Weight Bearing Restrictions: No      Mobility  Bed Mobility Overal bed mobility: Independent                Transfers Overall transfer level: Independent                  Ambulation/Gait Ambulation/Gait assistance: Independent Gait Distance (Feet): 150 Feet Assistive device: None          Stairs            Wheelchair Mobility    Modified Rankin (Stroke Patients Only)       Balance Overall balance assessment: Independent                                           Pertinent Vitals/Pain Pain Assessment: No/denies pain    Home Living Family/patient expects to be discharged to:: Private residence Living Arrangements: Children Available Help at Discharge:  Available PRN/intermittently Type of Home: House Home Access: Level entry     Home Layout: One level Home Equipment: None      Prior Function Level of Independence: Independent               Hand Dominance        Extremity/Trunk Assessment   Upper Extremity Assessment Upper Extremity Assessment: Generalized weakness    Lower Extremity Assessment Lower Extremity Assessment: Generalized weakness    Cervical / Trunk Assessment Cervical / Trunk Assessment: Normal  Communication   Communication: No difficulties  Cognition Arousal/Alertness: Awake/alert Behavior During Therapy: WFL for tasks assessed/performed Overall Cognitive Status: Within Functional Limits for tasks assessed                                        General Comments      Exercises     Assessment/Plan    PT Assessment Patent does not need any further PT services  PT Problem List         PT Treatment Interventions      PT Goals (Current goals can be found in the Care Plan section)  Acute Rehab PT Goals PT Goal Formulation: All assessment and education complete, DC therapy  Frequency     Barriers to discharge        Co-evaluation               AM-PAC PT "6 Clicks" Mobility  Outcome Measure Help needed turning from your back to your side while in a flat bed without using bedrails?: None Help needed moving from lying on your back to sitting on the side of a flat bed without using bedrails?: None Help needed moving to and from a bed to a chair (including a wheelchair)?: None Help needed standing up from a chair using your arms (e.g., wheelchair or bedside chair)?: None Help needed to walk in hospital room?: None Help needed climbing 3-5 steps with a railing? : A Little 6 Click Score: 23    End of Session Equipment Utilized During Treatment: Gait belt Activity Tolerance: Patient tolerated treatment well Patient left: in bed;with bed alarm set;with call  bell/phone within reach Nurse Communication: Mobility status      Time: 0946-1000 PT Time Calculation (min) (ACUTE ONLY): 14 min   Charges:   PT Evaluation $PT Eval Low Complexity: 1 Low          Isabelle Course, PT, DPT  Janet Williamson 04/22/2019, 11:24 AM

## 2019-04-23 ENCOUNTER — Telehealth: Payer: Self-pay | Admitting: Endocrinology

## 2019-04-23 ENCOUNTER — Inpatient Hospital Stay (HOSPITAL_COMMUNITY): Payer: Medicare Other | Admitting: Certified Registered"

## 2019-04-23 ENCOUNTER — Encounter (HOSPITAL_COMMUNITY)
Admission: EM | Disposition: A | Discharge status: Home / Self Care | Payer: Self-pay | Source: Home / Self Care | Attending: Internal Medicine

## 2019-04-23 HISTORY — PX: ESOPHAGOGASTRODUODENOSCOPY (EGD) WITH PROPOFOL: SHX5813

## 2019-04-23 LAB — CBC WITH DIFFERENTIAL/PLATELET
Abs Immature Granulocytes: 0.04 10*3/uL (ref 0.00–0.07)
Basophils Absolute: 0 10*3/uL (ref 0.0–0.1)
Basophils Relative: 1 %
Eosinophils Absolute: 0.1 10*3/uL (ref 0.0–0.5)
Eosinophils Relative: 2 %
HCT: 24.7 % — ABNORMAL LOW (ref 36.0–46.0)
Hemoglobin: 7.5 g/dL — ABNORMAL LOW (ref 12.0–15.0)
Immature Granulocytes: 1 %
Lymphocytes Relative: 27 %
Lymphs Abs: 2.3 10*3/uL (ref 0.7–4.0)
MCH: 23.1 pg — ABNORMAL LOW (ref 26.0–34.0)
MCHC: 30.4 g/dL (ref 30.0–36.0)
MCV: 76.2 fL — ABNORMAL LOW (ref 80.0–100.0)
Monocytes Absolute: 0.6 10*3/uL (ref 0.1–1.0)
Monocytes Relative: 7 %
Neutro Abs: 5.6 10*3/uL (ref 1.7–7.7)
Neutrophils Relative %: 62 %
Platelets: 370 10*3/uL (ref 150–400)
RBC: 3.24 MIL/uL — ABNORMAL LOW (ref 3.87–5.11)
RDW: 23.5 % — ABNORMAL HIGH (ref 11.5–15.5)
WBC: 8.8 10*3/uL (ref 4.0–10.5)
nRBC: 0.8 % — ABNORMAL HIGH (ref 0.0–0.2)

## 2019-04-23 LAB — BASIC METABOLIC PANEL
Anion gap: 12 (ref 5–15)
BUN: 7 mg/dL — ABNORMAL LOW (ref 8–23)
CO2: 23 mmol/L (ref 22–32)
Calcium: 9.1 mg/dL (ref 8.9–10.3)
Chloride: 105 mmol/L (ref 98–111)
Creatinine, Ser: 0.64 mg/dL (ref 0.44–1.00)
GFR calc Af Amer: >60 mL/min (ref >60)
GFR calc non Af Amer: >60 mL/min (ref >60)
Glucose, Bld: 92 mg/dL (ref 70–99)
Potassium: 3.6 mmol/L (ref 3.5–5.1)
Sodium: 140 mmol/L (ref 135–145)

## 2019-04-23 LAB — GLUCOSE, CAPILLARY: Glucose-Capillary: 102 mg/dL — ABNORMAL HIGH (ref 70–99)

## 2019-04-23 SURGERY — ESOPHAGOGASTRODUODENOSCOPY (EGD) WITH PROPOFOL
Anesthesia: Monitor Anesthesia Care

## 2019-04-23 MED ORDER — PANTOPRAZOLE SODIUM 40 MG PO TBEC
40.0000 mg | DELAYED_RELEASE_TABLET | Freq: Two times a day (BID) | ORAL | Status: DC
  Administered 2019-04-23 – 2019-04-24 (×2): 40 mg via ORAL
  Filled 2019-04-23 (×2): qty 1

## 2019-04-23 MED ORDER — LACTATED RINGERS IV SOLN
INTRAVENOUS | Status: DC
  Administered 2019-04-23: 09:00:00 via INTRAVENOUS

## 2019-04-23 MED ORDER — PROPOFOL 500 MG/50ML IV EMUL
INTRAVENOUS | Status: DC | PRN
  Administered 2019-04-23: 125 ug/kg/min via INTRAVENOUS

## 2019-04-23 MED ORDER — RIVAROXABAN 20 MG PO TABS
20.0000 mg | ORAL_TABLET | Freq: Every day | ORAL | Status: DC
  Administered 2019-04-23: 20 mg via ORAL
  Filled 2019-04-23: qty 1

## 2019-04-23 MED ORDER — PROPOFOL 10 MG/ML IV BOLUS
INTRAVENOUS | Status: DC | PRN
  Administered 2019-04-23: 20 mg via INTRAVENOUS
  Administered 2019-04-23: 30 mg via INTRAVENOUS

## 2019-04-23 SURGICAL SUPPLY — 15 items

## 2019-04-23 NOTE — Progress Notes (Signed)
ANTICOAGULATION CONSULT NOTE - Initial Consult  Pharmacy Consult for Xarelto Indication: atrial fibrillation and CVA  Allergies  Allergen Reactions  . Ace Inhibitors Swelling and Other (See Comments)    Angioedema   . Keflex [Cephalexin] Swelling and Other (See Comments)    Lips became swollwn  . Rifadin [Rifampin] Swelling and Other (See Comments)    Lips became swollen    Patient Measurements: Height: 5\' 4"  (162.6 cm) Weight: 209 lb 10.5 oz (95.1 kg) IBW/kg (Calculated) : 54.7  Vital Signs: Temp: 98 F (36.7 C) (07/16 1016) Temp Source: Oral (07/16 1016) BP: 132/56 (07/16 1016) Pulse Rate: 62 (07/16 1016)  Labs: Recent Labs    04/21/19 1358 04/21/19 1525 04/22/19 0715 04/23/19 0243  HGB 5.3*  --  7.6* 7.5*  HCT 20.3*  --  24.5* 24.7*  PLT 483*  --  376 370  LABPROT  --  18.2*  --   --   INR  --  1.5*  --   --   CREATININE 0.79  --   --  0.64    Estimated Creatinine Clearance: 64.9 mL/min (by C-G formula based on SCr of 0.64 mg/dL).   Medical History: Past Medical History:  Diagnosis Date  . Anemia   . Cardiac mass    a. 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 Not seen on most recent TEE 2019.  . Cardiomyopathy in other disease   . Cataracts, bilateral   . Diabetes mellitus    Type 2  . Generalized osteoarthritis   . GERD (gastroesophageal reflux disease)   . HLD (hyperlipidemia)   . Hypertension   . Hypothyroidism   . LBBB (left bundle branch block)   . PAF (paroxysmal atrial fibrillation) ([place])    a. s/p DCCV 06/2017, on Xarelto  . Phlebitis   . S/P TAVR (transcatheter aortic valve replacement) 04/15/2018   Edwards Sapien 3 THV (size 23 mm, model # 9600TFX, serial # [id number]) via the TF approach  . Severe aortic stenosis    a. s/p TAVR 04/2018.  . Stroke [facility]) 2008   Assessment:  78 yr old female admitted 7/14 with recurrent GI bleed.  On Xarelto 20 mg daily with supper PTA for atrial fibrillation and hx stroke.  Xarelto held, last PTA dose  04/20/19.   s/p EGD today with no bleeding source found. GI has cleared for resuming anticoagulation.  Hgb 5.3 on admit, received 2 units PRBCs, Hgb up to 7.5. Platelet count normal. Protonix 40 mg IV q12h changing to PO BID tonight  Goal of Therapy:  appropriate Xarelto dose for indication and renal function Monitor platelets by anticoagulation protocol: Yes   Plan:   Resume Xarelto 20 mg daily with supper tonight.  Monitor for s/sx bleeding.  Arty Baumgartner, [place] Pager: [phone] or phone: [phone] 04/23/2019,2:09 PM

## 2019-04-23 NOTE — Telephone Encounter (Signed)
Patient called to inform she is in the hospital and will try to make her appointment  next Friday but had to cancel labs for monday.

## 2019-04-23 NOTE — H&P (Signed)
The patient presents to the endoscopy department for EGD to evaluate melena and anemia.  There is a prior history of duodenal ulcer.  Physical alert  No distress  Heart regular rhythm  Lungs clear  Abdomen soft and nontender  Impression melena  Plan EGD

## 2019-04-23 NOTE — Progress Notes (Signed)
PROGRESS NOTE    Janet Williamson  FAO:[id number] DOB: 02/21/1941 DOA: 04/21/2019 PCP: Maurice SmallGriffin, [name], MD     Brief Narrative:  Janet Williamson is a 78 y.o. female with past medical history of stroke, history of TAVR, atrial fibrillation on anticoagulation, diabetes mellitus, hypertension, hypothyroidism, hyperlipidemia, presented to hospital with complaints of shortness of breath and palpitation for the last 1 week.  Patient states that her shortness of breath is exertional and is okay at rest.  Denies any fever, chills or rigor.  No recent cough myalgia or body aches.  Patient denies dizziness, lightheadedness. Patient does have history of GI bleeding in the past but is taking anticoagulants because of a stroke.  Patient has been noticing some dark-colored stools recently since Saturday at least 3-4 episodes.  Last episode of black stool was this morning..  Patient denies any urinary urgency, frequency or dysuria.  Denies any nausea, vomiting or abdominal pain. Patient was admitted to the hospital and diagnosis of acute blood loss anemia due to upper GI bleed.   New events last 24 hours / Subjective: Underwent EGD this morning, no complaints other than being hungry  Assessment & Plan:   Principal Problem:   GI bleed Active Problems:   DM (diabetes mellitus) (HCC)   HTN (hypertension)   Anemia   History of stroke   PAF (paroxysmal atrial fibrillation) (HCC)   Severe aortic stenosis   S/P TAVR (transcatheter aortic valve replacement)   Acute blood loss anemia due to upper GI bleed Received 2 unit packed red blood cells during hospitalization.  Underwent EGD 7/16 which did not reveal any source of bleeding.  As she has recently undergone colonoscopy and capsule study, GI is not planning on further procedures during this hospitalization.  Resume Xarelto  Paroxysmal atrial fibrillation Continue rate control with metoprolol, resume Xarelto  T2DM Sliding scale insulin  Severe  aortic stenosis/ sp TAVR Currently stable  Hyperlipidemia Continue Zocor  HTN Will hold amlodipine and HCTZ, continue metoprolol for now.   Hx CVA Stable  Obesity Body mass index is 35.99 kg/m.    DVT prophylaxis: SCD Code Status: Full Family Communication: None Disposition Plan: Pending stabilization and hemoglobin, hopeful discharge home 7/17   Consultants:   GI  Procedures:   EGD 7/16  Antimicrobials:  Anti-infectives (From admission, onward)   None        Objective: Vitals:   04/23/19 0922 04/23/19 0930 04/23/19 0940 04/23/19 1016  BP: (!) 112/33 (!) 110/46 124/61 (!) 132/56  Pulse: 70 67 70 62  Resp: 18 16 16 16   Temp: 97.9 F (36.6 C)   98 F (36.7 C)  TempSrc: Oral   Oral  SpO2: 96% 96% 99% 100%  Weight:      Height:        Intake/Output Summary (Last 24 hours) at 04/23/2019 1332 Last data filed at 04/23/2019 0918 Gross per 24 hour  Intake 459.05 ml  Output 2451 ml  Net -1991.95 ml   Filed Weights   04/21/19 2020 04/22/19 0500 04/23/19 0500  Weight: 83.6 kg 81.2 kg 95.1 kg    Examination:  General exam: Appears calm and comfortable  Respiratory system: Clear to auscultation. Respiratory effort normal. Cardiovascular system: S1 & S2 heard, RRR. No JVD, murmurs, rubs, gallops or clicks. No pedal edema. Gastrointestinal system: Abdomen is nondistended, soft and nontender. No organomegaly or masses felt. Normal bowel sounds heard. Central nervous system: Alert and oriented. No focal neurological deficits. Extremities: Symmetric 5 x 5  power. Skin: No rashes, lesions or ulcers Psychiatry: Judgement and insight appear normal. Mood & affect appropriate.   Data Reviewed: I have personally reviewed following labs and imaging studies  CBC: Recent Labs  Lab 04/21/19 1358 04/22/19 0715 04/23/19 0243  WBC 10.9* 10.7* 8.8  NEUTROABS  --   --  5.6  HGB 5.3* 7.6* 7.5*  HCT 20.3* 24.5* 24.7*  MCV 70.7* 74.5* 76.2*  PLT 483* 376 202    Basic Metabolic Panel: Recent Labs  Lab 04/21/19 1358 04/23/19 0243  NA 137 140  K 4.2 3.6  CL 99 105  CO2 25 23  GLUCOSE 126* 92  BUN 18 7*  CREATININE 0.79 0.64  CALCIUM 9.8 9.1   GFR: Estimated Creatinine Clearance: 64.9 mL/min (by C-G formula based on SCr of 0.64 mg/dL). Liver Function Tests: No results for input(s): AST, ALT, ALKPHOS, BILITOT, PROT, ALBUMIN in the last 168 hours. No results for input(s): LIPASE, AMYLASE in the last 168 hours. No results for input(s): AMMONIA in the last 168 hours. Coagulation Profile: Recent Labs  Lab 04/21/19 1525  INR 1.5*   Cardiac Enzymes: No results for input(s): CKTOTAL, CKMB, CKMBINDEX, TROPONINI in the last 168 hours. BNP (last 3 results) No results for input(s): PROBNP in the last 8760 hours. HbA1C: No results for input(s): HGBA1C in the last 72 hours. CBG: Recent Labs  Lab 04/23/19 0833  GLUCAP 102*   Lipid Profile: No results for input(s): CHOL, HDL, LDLCALC, TRIG, CHOLHDL, LDLDIRECT in the last 72 hours. Thyroid Function Tests: No results for input(s): TSH, T4TOTAL, FREET4, T3FREE, THYROIDAB in the last 72 hours. Anemia Panel: No results for input(s): VITAMINB12, FOLATE, FERRITIN, TIBC, IRON, RETICCTPCT in the last 72 hours. Sepsis Labs: No results for input(s): PROCALCITON, LATICACIDVEN in the last 168 hours.  Recent Results (from the past 240 hour(s))  SARS Coronavirus 2 (CEPHEID - Performed in [place] hospital lab), Hosp Order     Status: None   Collection Time: 04/21/19  5:35 PM   Specimen: Nasopharyngeal Swab  Result Value Ref Range Status   SARS Coronavirus 2 NEGATIVE NEGATIVE Final    Comment: (NOTE) If result is NEGATIVE SARS-CoV-2 target nucleic acids are NOT DETECTED. The SARS-CoV-2 RNA is generally detectable in upper and lower  respiratory specimens during the acute phase of infection. The lowest  concentration of SARS-CoV-2 viral copies this assay can detect is 250  copies / mL. A  negative result does not preclude SARS-CoV-2 infection  and should not be used as the sole basis for treatment or other  patient management decisions.  A negative result may occur with  improper specimen collection / handling, submission of specimen other  than nasopharyngeal swab, presence of viral mutation(s) within the  areas targeted by this assay, and inadequate number of viral copies  (<250 copies / mL). A negative result must be combined with clinical  observations, patient history, and epidemiological information. If result is POSITIVE SARS-CoV-2 target nucleic acids are DETECTED. The SARS-CoV-2 RNA is generally detectable in upper and lower  respiratory specimens dur ing the acute phase of infection.  Positive  results are indicative of active infection with SARS-CoV-2.  Clinical  correlation with patient history and other diagnostic information is  necessary to determine patient infection status.  Positive results do  not rule out bacterial infection or co-infection with other viruses. If result is PRESUMPTIVE POSTIVE SARS-CoV-2 nucleic acids MAY BE PRESENT.   A presumptive positive result was obtained on the submitted specimen  and confirmed on repeat testing.  While 2019 novel coronavirus  (SARS-CoV-2) nucleic acids may be present in the submitted sample  additional confirmatory testing may be necessary for epidemiological  and / or clinical management purposes  to differentiate between  SARS-CoV-2 and other Sarbecovirus currently known to infect humans.  If clinically indicated additional testing with an alternate test  methodology [phone]) is advised. The SARS-CoV-2 RNA is generally  detectable in upper and lower respiratory sp ecimens during the acute  phase of infection. The expected result is Negative. Fact Sheet for Patients:  BoilerBrush.com.cy Fact Sheet for Healthcare Providers: https://pope.com/ This test is not  yet approved or cleared by the [place] FDA and has been authorized for detection and/or diagnosis of SARS-CoV-2 by FDA under an Emergency Use Authorization (EUA).  This EUA will remain in effect (meaning this test can be used) for the duration of the COVID-19 declaration under Section 564(b)(1) of the Act, 21 U.S.C. section 360bbb-3(b)(1), unless the authorization is terminated or revoked sooner. Performed at [facility], [address] [place], [place] [address]   MRSA PCR Screening     Status: None   Collection Time: 04/21/19  6:29 PM   Specimen: Nasal Mucosa; Nasopharyngeal  Result Value Ref Range Status   MRSA by PCR NEGATIVE NEGATIVE Final    Comment:        The GeneXpert MRSA Assay (FDA approved for NASAL specimens only), is one component of a comprehensive MRSA colonization surveillance program. It is not intended to diagnose MRSA infection nor to guide or monitor treatment for MRSA infections. Performed at [facility], [address], [place], [place] [address]   Urine culture     Status: Abnormal   Collection Time: 04/21/19 10:41 PM   Specimen: Urine, Clean Catch  Result Value Ref Range Status   Specimen Description URINE, CLEAN CATCH  Final   Special Requests   Final    NONE Performed at [facility], [address] [place], [place] [address]    Culture MULTIPLE SPECIES PRESENT, SUGGEST RECOLLECTION (A)  Final   Report Status 04/22/2019 FINAL  Final      Radiology Studies: Dg Chest 2 View  Result Date: 04/21/2019 CLINICAL DATA:  Shortness of breath for 5 days. EXAM: CHEST - 2 VIEW COMPARISON:  PA and lateral chest 09/29/2018 and 07/06/2017. FINDINGS: The patient is status post aortic valve replacement. Heart size is normal. Aortic atherosclerosis is noted. Lungs are clear. No pneumothorax or pleural effusion. No acute or focal bony abnormality. IMPRESSION: No acute disease. Atherosclerosis. Electronically Signed   By: Drusilla Kanner  M.D.   On: 04/21/2019 14:55      Scheduled Meds: . metoprolol succinate  50 mg Oral Daily  . pantoprazole (PROTONIX) IV  40 mg Intravenous Q12H  . simvastatin  20 mg Oral QPM   Continuous Infusions:   LOS: 2 days      Time spent: 35 minutes   Noralee Stain, DO [facility] [email or web address] 04/23/2019, 1:32 PM

## 2019-04-23 NOTE — Anesthesia Preprocedure Evaluation (Addendum)
Anesthesia Evaluation  Patient identified by MRN, date of birth, ID band Patient awake    Reviewed: Allergy & Precautions, H&P , NPO status , Patient's Chart, lab work & pertinent test results, reviewed documented beta blocker date and time   Airway Mallampati: II  TM Distance: >3 FB Neck ROM: Full    Dental no notable dental hx. (+) Edentulous Upper, Edentulous Lower, Dental Advisory Given   Pulmonary neg pulmonary ROS,    Pulmonary exam normal breath sounds clear to auscultation       Cardiovascular hypertension, Pt. on medications and Pt. on home beta blockers + dysrhythmias  Rhythm:Regular Rate:Normal     Neuro/Psych CVA negative psych ROS   GI/Hepatic Neg liver ROS, GERD  Medicated and Controlled,  Endo/Other  diabetes, Type 2, Oral Hypoglycemic AgentsHypothyroidism   Renal/GU negative Renal ROS  negative genitourinary   Musculoskeletal  (+) Arthritis , Osteoarthritis,    Abdominal   Peds  Hematology  (+) Blood dyscrasia, anemia ,   Anesthesia Other Findings   Reproductive/Obstetrics negative OB ROS                            Anesthesia Physical Anesthesia Plan  ASA: III  Anesthesia Plan: MAC   Post-op Pain Management:    Induction: Intravenous  PONV Risk Score and Plan: 2 and Propofol infusion and Treatment may vary due to age or medical condition  Airway Management Planned: Nasal Cannula  Additional Equipment:   Intra-op Plan:   Post-operative Plan:   Informed Consent: I have reviewed the patients History and Physical, chart, labs and discussed the procedure including the risks, benefits and alternatives for the proposed anesthesia with the patient or authorized representative who has indicated his/her understanding and acceptance.     Dental advisory given  Plan Discussed with: CRNA  Anesthesia Plan Comments:         Anesthesia Quick Evaluation

## 2019-04-23 NOTE — Op Note (Signed)
[facility] Patient Name: Janet Williamson Procedure Date : 04/23/2019 MRN: [id number] Attending MD: Graylin Shiver , MD Date of Birth: 06-Oct-1941 CSN: [id number] Age: 78 Admit Type: Inpatient Procedure:                Upper GI endoscopy Indications:              Melena Providers:                Graylin Shiver, MD, Vicki Mallet, RN, Beryle Beams, Technician Referring MD:              Medicines:                Propofol per Anesthesia Complications:            No immediate complications. Estimated Blood Loss:     Estimated blood loss: none. Procedure:                Pre-Anesthesia Assessment:                           - Prior to the procedure, a History and Physical                            was performed, and patient medications and                            allergies were reviewed. The patient's tolerance of                            previous anesthesia was also reviewed. The risks                            and benefits of the procedure and the sedation                            options and risks were discussed with the patient.                            All questions were answered, and informed consent                            was obtained. Prior Anticoagulants: The patient has                            taken Eliquis (apixaban), last dose was 3 days                            prior to procedure. ASA Grade Assessment: III - A                            patient with severe systemic disease. After  reviewing the risks and benefits, the patient was                            deemed in satisfactory condition to undergo the                            procedure.                           After obtaining informed consent, the endoscope was                            passed under direct vision. Throughout the                            procedure, the patient's blood pressure, pulse, and   oxygen saturations were monitored continuously. The                            [id number]-[id number] ([id number] Olympus gastroscope was                            introduced through the mouth, and advanced to the                            second part of duodenum. The upper GI endoscopy was                            accomplished without difficulty. The patient                            tolerated the procedure well. Scope In: Scope Out: Findings:      The examined esophagus was normal.      The entire examined stomach was normal.      The examined duodenum was normal. Impression:               - Normal esophagus.                           - Normal stomach.                           - Normal examined duodenum.                           - No specimens collected. Recommendation:           - Resume regular diet.                           - Continue present medications.                           - Since she had a colonoscopy last December and a                            capsule study, I do not  see a reason to repeat                            them. Follow clinically. PPI therapy since she had                            an ulcer in the past. Resume anticoagulants when                            needed. We will sign off, call if needed. Procedure Code(s):        --- Professional ---                           [phone], Esophagogastroduodenoscopy, flexible,                            transoral; diagnostic, including collection of                            specimen(s) by brushing or washing, when performed                            (separate procedure) Diagnosis Code(s):        --- Professional ---                           K92.1, Melena (includes Hematochezia) CPT copyright 2019 American Medical Association. All rights reserved. The codes documented in this report are preliminary and upon coder review may  be revised to meet current compliance requirements. Wonda Horner, MD 04/23/2019 9:33:15 AM This  report has been signed electronically. Number of Addenda: 0

## 2019-04-23 NOTE — Anesthesia Postprocedure Evaluation (Signed)
Anesthesia Post Note  Patient: Janet Williamson  Procedure(s) Performed: ESOPHAGOGASTRODUODENOSCOPY (EGD) WITH PROPOFOL (N/A )     Patient location during evaluation: PACU Anesthesia Type: MAC Level of consciousness: awake and alert Pain management: pain level controlled Vital Signs Assessment: post-procedure vital signs reviewed and stable Respiratory status: spontaneous breathing, nonlabored ventilation and respiratory function stable Cardiovascular status: stable and blood pressure returned to baseline Postop Assessment: no apparent nausea or vomiting Anesthetic complications: no    Last Vitals:  Vitals:   04/23/19 0940 04/23/19 1016  BP: 124/61 (!) 132/56  Pulse: 70 62  Resp: 16 16  Temp:  36.7 C  SpO2: 99% 100%    Last Pain:  Vitals:   04/23/19 1016  TempSrc: Oral  PainSc: 0-No pain                 Jerzy Roepke,W. EDMOND

## 2019-04-23 NOTE — Transfer of Care (Signed)
Immediate Anesthesia Transfer of Care Note  Patient: Janet Williamson  Procedure(s) Performed: ESOPHAGOGASTRODUODENOSCOPY (EGD) WITH PROPOFOL (N/A )  Patient Location: Endoscopy Unit  Anesthesia Type:MAC  Level of Consciousness: drowsy and patient cooperative  Airway & Oxygen Therapy: Patient Spontanous Breathing  Post-op Assessment: Report given to RN and Post -op Vital signs reviewed and stable  Post vital signs: Reviewed and stable  Last Vitals:  Vitals Value Taken Time  BP 112/33 04/23/19 0922  Temp 36.6 C 04/23/19 0922  Pulse 70 04/23/19 0922  Resp 18 04/23/19 0922  SpO2 96 % 04/23/19 0922    Last Pain:  Vitals:   04/23/19 0922  TempSrc: Oral  PainSc: 0-No pain      Patients Stated Pain Goal: 0 (28/36/62 [id number])  Complications: No apparent anesthesia complications

## 2019-04-24 ENCOUNTER — Encounter (HOSPITAL_COMMUNITY): Payer: Self-pay | Admitting: Gastroenterology

## 2019-04-24 LAB — CBC
HCT: 24.9 % — ABNORMAL LOW (ref 36.0–46.0)
Hemoglobin: 7.5 g/dL — ABNORMAL LOW (ref 12.0–15.0)
MCH: 22.9 pg — ABNORMAL LOW (ref 26.0–34.0)
MCHC: 30.1 g/dL (ref 30.0–36.0)
MCV: 76.1 fL — ABNORMAL LOW (ref 80.0–100.0)
Platelets: 382 10*3/uL (ref 150–400)
RBC: 3.27 MIL/uL — ABNORMAL LOW (ref 3.87–5.11)
RDW: 24.6 % — ABNORMAL HIGH (ref 11.5–15.5)
WBC: 8.8 10*3/uL (ref 4.0–10.5)
nRBC: 0.2 % (ref 0.0–0.2)

## 2019-04-24 MED ORDER — FERROUS SULFATE 325 (65 FE) MG PO TABS: 325.0000 mg | ORAL_TABLET | Freq: Every day | ORAL | 2 refills | Status: DC

## 2019-04-24 MED ORDER — SODIUM CHLORIDE 0.9 % IV SOLN
510.0000 mg | Freq: Once | INTRAVENOUS | Status: AC
  Administered 2019-04-24: 510 mg via INTRAVENOUS
  Filled 2019-04-24: qty 17

## 2019-04-24 MED ORDER — PANTOPRAZOLE SODIUM 40 MG PO TBEC: 40.0000 mg | DELAYED_RELEASE_TABLET | Freq: Two times a day (BID) | ORAL | 2 refills | Status: DC

## 2019-04-24 NOTE — Discharge Summary (Signed)
Physician Discharge Summary  Janet Williamson WUJ:[id number] DOB: [birth year] DOA: 04/21/2019  PCP: Maurice Small, MD  Admit date: 04/21/2019 Discharge date: 04/24/2019  Admitted From: Home Disposition:  Home  Recommendations for Outpatient Follow-up:  1. Follow up with PCP in 1 week 2. Follow up with GI as needed basis, follows with Dr. Hinda Lenis GI as outpatient  3. Please obtain CBC in 1 week   Discharge Condition: Stable CODE STATUS: Full code Diet recommendation: Heart healthy/carb modified  Brief/Interim Summary: Janet P Townsendis a 78 y.o.femalewith past medical history of stroke, history of TAVR,atrial fibrillation on anticoagulation,diabetes mellitus, hypertension, hypothyroidism, hyperlipidemia, presented to hospital with complaints of shortness of breath and palpitation for the last 1 week. Patient states that her shortness of breath is exertional and is okay at rest. Denies any fever, chills or rigor. No recent cough myalgia or body aches. Patient denies dizziness, lightheadedness. Patient does have history of GI bleeding in the past but is taking anticoagulants because of a stroke. Patient has been noticing some dark-colored stools recentlysince Saturday at least 3-4 episodes. Last episode of black stool was this morning.. Patient denies any urinary urgency, frequency or dysuria. Denies any nausea,vomiting or abdominal pain. Patient was admitted to the hospital and diagnosis of acute blood loss anemia due to upper GI bleed. Underwent EGD 7/16 which did not reveal any source of bleeding.  As she has recently undergone colonoscopy and capsule study, GI is not planning on further procedures during this hospitalization.  Resume Xarelto.  Patient has remained stable overnight, hemoglobin remained stable without further episodes of GI bleeding evidence.  Discharge Diagnoses:  Principal Problem:   GI bleed Active Problems:   DM (diabetes mellitus) (HCC)   HTN  (hypertension)   Anemia   History of stroke   PAF (paroxysmal atrial fibrillation) (HCC)   Severe aortic stenosis   S/P TAVR (transcatheter aortic valve replacement)  Acute blood loss anemia due to upper GI bleed Received 2 unit packed red blood cells during hospitalization.  Underwent EGD 7/16 which did not reveal any source of bleeding.  As she has recently undergone colonoscopy and capsule study, GI is not planning on further procedures during this hospitalization.  Resume Xarelto.  IV iron given prior to discharge home  Paroxysmal atrial fibrillation Continue rate control with metoprolol, resume Xarelto  T2DM Sliding scale insulin while inpatient   Severe aortic stenosis/ sp TAVR Currently stable  Hyperlipidemia Continue Zocor  HTN Resume home antihypertensives   Hx CVA Stable  Obesity Body mass index is 35.99 kg/m.   Discharge Instructions  Discharge Instructions    Call MD for:  difficulty breathing, headache or visual disturbances   Complete by: As directed    Call MD for:  extreme fatigue   Complete by: As directed    Call MD for:  hives   Complete by: As directed    Call MD for:  persistant dizziness or light-headedness   Complete by: As directed    Call MD for:  persistant nausea and vomiting   Complete by: As directed    Call MD for:  severe uncontrolled pain   Complete by: As directed    Call MD for:  temperature >100.4   Complete by: As directed    Diet - low sodium heart healthy   Complete by: As directed    Discharge instructions   Complete by: As directed    You were cared for by a hospitalist during your hospital stay. If you have  any questions about your discharge medications or the care you received while you were in the hospital after you are discharged, you can call the unit and ask to speak with the hospitalist on call if the hospitalist that took care of you is not available. Once you are discharged, your primary care physician will  handle any further medical issues. Please note that NO REFILLS for any discharge medications will be authorized once you are discharged, as it is imperative that you return to your primary care physician (or establish a relationship with a primary care physician if you do not have one) for your aftercare needs so that they can reassess your need for medications and monitor your lab values.   Increase activity slowly   Complete by: As directed      Allergies as of 04/24/2019      Reactions   Ace Inhibitors Swelling, Other (See Comments)   Angioedema   Keflex [cephalexin] Swelling, Other (See Comments)   Lips became swollwn   Rifadin [rifampin] Swelling, Other (See Comments)   Lips became swollen      Medication List    TAKE these medications   acetaminophen 325 MG tablet Commonly known as: TYLENOL Take 325-650 mg by mouth every 8 (eight) hours as needed (for headaches).   amLODipine 5 MG tablet Commonly known as: NORVASC Take 5 mg by mouth daily.   calcium carbonate 500 MG chewable tablet Commonly known as: TUMS - dosed in mg elemental calcium Chew 1-2 tablets by mouth as needed for indigestion or heartburn.   ferrous sulfate 325 (65 FE) MG tablet Take 1 tablet (325 mg total) by mouth daily.   glucose blood test strip Commonly known as: Accu-Chek Aviva Use as instructed to check once daily DX: E11.9   hydrochlorothiazide 25 MG tablet Commonly known as: HYDRODIURIL Take 25 mg by mouth daily.   metFORMIN 750 MG 24 hr tablet Commonly known as: GLUCOPHAGE-XR Take 2 tablets (1,500 mg total) by mouth daily with breakfast.   metoprolol succinate 50 MG 24 hr tablet Commonly known as: TOPROL-XL Take 50 mg by mouth daily.   pantoprazole 40 MG tablet Commonly known as: PROTONIX Take 1 tablet (40 mg total) by mouth 2 (two) times daily.   simvastatin 20 MG tablet Commonly known as: ZOCOR Take 20 mg by mouth every evening.   SYSTANE OP Place 1 drop into both eyes 2 (two)  times daily.   Vitamin D3 125 MCG (5000 UT) Caps Take 5,000 Units by mouth daily.   Xarelto 20 MG Tabs tablet Generic drug: rivaroxaban TAKE 1 TABLET BY MOUTH ONCE DAILY WITH SUPPER What changed: See the new instructions.      Follow-up Information    Kelton Pillar, MD. Schedule an appointment as soon as possible for a visit in 1 week(s).   Specialty: Family Medicine Why: Repeat CBC in 1 week  Contact information: [address] [address] [phone]        Arta Silence, MD Follow up.   Specialty: Gastroenterology Contact information: [address] [place] [address] [phone]          Allergies  Allergen Reactions  . Ace Inhibitors Swelling and Other (See Comments)    Angioedema   . Keflex [Cephalexin] Swelling and Other (See Comments)    Lips became swollwn  . Rifadin [Rifampin] Swelling and Other (See Comments)    Lips became swollen    Consultations:  GI   Procedures/Studies:  Dg Chest 2 View  Result Date: 04/21/2019 CLINICAL DATA:  Shortness of breath for 5 days. EXAM: CHEST - 2 VIEW COMPARISON:  PA and lateral chest 09/29/2018 and 07/06/2017. FINDINGS: The patient is status post aortic valve replacement. Heart size is normal. Aortic atherosclerosis is noted. Lungs are clear. No pneumothorax or pleural effusion. No acute or focal bony abnormality. IMPRESSION: No acute disease. Atherosclerosis. Electronically Signed   By: Drusilla Kanner M.D.   On: 04/21/2019 14:55    EGD 7/16 Impression:       - Normal esophagus.                           - Normal stomach.                           - Normal examined duodenum.                           - No specimens collected.   Discharge Exam: Vitals:   04/23/19 2001 04/24/19 0445  BP: (!) 133/56 (!) 117/57  Pulse: 70 66  Resp: 20 20  Temp: 98 F (36.7 C) 98.3 F (36.8 C)  SpO2: 100% 98%     General: Pt is alert, awake, not in acute  distress Cardiovascular: RRR, S1/S2 +, no rubs, no gallops Respiratory: CTA bilaterally, no wheezing, no rhonchi Abdominal: Soft, NT, ND, bowel sounds + Extremities: no edema, no cyanosis    The results of significant diagnostics from this hospitalization (including imaging, microbiology, ancillary and laboratory) are listed below for reference.     Microbiology: Recent Results (from the past 240 hour(s))  SARS Coronavirus 2 (CEPHEID - Performed in [facility] hospital lab), Hosp Order     Status: None   Collection Time: 04/21/19  5:35 PM   Specimen: Nasopharyngeal Swab  Result Value Ref Range Status   SARS Coronavirus 2 NEGATIVE NEGATIVE Final    Comment: (NOTE) If result is NEGATIVE SARS-CoV-2 target nucleic acids are NOT DETECTED. The SARS-CoV-2 RNA is generally detectable in upper and lower  respiratory specimens during the acute phase of infection. The lowest  concentration of SARS-CoV-2 viral copies this assay can detect is 250  copies / mL. A negative result does not preclude SARS-CoV-2 infection  and should not be used as the sole basis for treatment or other  patient management decisions.  A negative result may occur with  improper specimen collection / handling, submission of specimen other  than nasopharyngeal swab, presence of viral mutation(s) within the  areas targeted by this assay, and inadequate number of viral copies  (<250 copies / mL). A negative result must be combined with clinical  observations, patient history, and epidemiological information. If result is POSITIVE SARS-CoV-2 target nucleic acids are DETECTED. The SARS-CoV-2 RNA is generally detectable in upper and lower  respiratory specimens dur ing the acute phase of infection.  Positive  results are indicative of active infection with SARS-CoV-2.  Clinical  correlation with patient history and other diagnostic information is  necessary to determine patient infection status.  Positive results do  not  rule out bacterial infection or co-infection with other viruses. If result is PRESUMPTIVE POSTIVE SARS-CoV-2 nucleic acids MAY BE PRESENT.   A presumptive positive result was obtained on the submitted specimen  and confirmed on repeat testing.  While 2019 novel coronavirus  (SARS-CoV-2) nucleic acids may be  present in the submitted sample  additional confirmatory testing may be necessary for epidemiological  and / or clinical management purposes  to differentiate between  SARS-CoV-2 and other Sarbecovirus currently known to infect humans.  If clinically indicated additional testing with an alternate test  methodology [phone]) is advised. The SARS-CoV-2 RNA is generally  detectable in upper and lower respiratory sp ecimens during the acute  phase of infection. The expected result is Negative. Fact Sheet for Patients:  BoilerBrush.com.cy Fact Sheet for Healthcare Providers: https://pope.com/ This test is not yet approved or cleared by the [place] FDA and has been authorized for detection and/or diagnosis of SARS-CoV-2 by FDA under an Emergency Use Authorization (EUA).  This EUA will remain in effect (meaning this test can be used) for the duration of the COVID-19 declaration under Section 564(b)(1) of the Act, 21 U.S.C. section 360bbb-3(b)(1), unless the authorization is terminated or revoked sooner. Performed at [facility], [address] [place], [place] [address]   MRSA PCR Screening     Status: None   Collection Time: 04/21/19  6:29 PM   Specimen: Nasal Mucosa; Nasopharyngeal  Result Value Ref Range Status   MRSA by PCR NEGATIVE NEGATIVE Final    Comment:        The GeneXpert MRSA Assay (FDA approved for NASAL specimens only), is one component of a comprehensive MRSA colonization surveillance program. It is not intended to diagnose MRSA infection nor to guide or monitor treatment for MRSA infections. Performed  at [facility], [address] [place], [place] [address]   Urine culture     Status: Abnormal   Collection Time: 04/21/19 10:41 PM   Specimen: Urine, Clean Catch  Result Value Ref Range Status   Specimen Description URINE, CLEAN CATCH  Final   Special Requests   Final    NONE Performed at [facility], [address] [place], [place] [address]    Culture MULTIPLE SPECIES PRESENT, SUGGEST RECOLLECTION (A)  Final   Report Status 04/22/2019 FINAL  Final     Labs: BNP (last 3 results) Recent Labs    09/29/18 2148  BNP 154.7*   Basic Metabolic Panel: Recent Labs  Lab 04/21/19 1358 04/23/19 0243  NA 137 140  K 4.2 3.6  CL 99 105  CO2 25 23  GLUCOSE 126* 92  BUN 18 7*  CREATININE 0.79 0.64  CALCIUM 9.8 9.1   Liver Function Tests: No results for input(s): AST, ALT, ALKPHOS, BILITOT, PROT, ALBUMIN in the last 168 hours. No results for input(s): LIPASE, AMYLASE in the last 168 hours. No results for input(s): AMMONIA in the last 168 hours. CBC: Recent Labs  Lab 04/21/19 1358 04/22/19 0715 04/23/19 0243 04/24/19 0315  WBC 10.9* 10.7* 8.8 8.8  NEUTROABS  --   --  5.6  --   HGB 5.3* 7.6* 7.5* 7.5*  HCT 20.3* 24.5* 24.7* 24.9*  MCV 70.7* 74.5* 76.2* 76.1*  PLT 483* 376 370 382   Cardiac Enzymes: No results for input(s): CKTOTAL, CKMB, CKMBINDEX, TROPONINI in the last 168 hours. BNP: Invalid input(s): POCBNP CBG: Recent Labs  Lab 04/23/19 0833  GLUCAP 102*   D-Dimer No results for input(s): DDIMER in the last 72 hours. Hgb A1c No results for input(s): HGBA1C in the last 72 hours. Lipid Profile No results for input(s): CHOL, HDL, LDLCALC, TRIG, CHOLHDL, LDLDIRECT in the last 72 hours. Thyroid function studies No results for input(s): TSH, T4TOTAL, T3FREE, THYROIDAB in the last  72 hours.  Invalid input(s): FREET3 Anemia work up No results for input(s): VITAMINB12, FOLATE, FERRITIN, TIBC, IRON, RETICCTPCT in the last 72 hours. Urinalysis     Component Value Date/Time   COLORURINE YELLOW 10/30/2018 0936   APPEARANCEUR [place] (A) 10/30/2018 0936   LABSPEC 1.020 10/30/2018 0936   PHURINE 5.5 10/30/2018 0936   GLUCOSEU NEGATIVE 10/30/2018 0936   HGBUR NEGATIVE 10/30/2018 0936   BILIRUBINUR NEGATIVE 10/30/2018 0936   KETONESUR NEGATIVE 10/30/2018 0936   PROTEINUR NEGATIVE 09/30/2018 0013   UROBILINOGEN 0.2 10/30/2018 0936   NITRITE NEGATIVE 10/30/2018 0936   LEUKOCYTESUR NEGATIVE 10/30/2018 0936   Sepsis Labs Invalid input(s): PROCALCITONIN,  WBC,  LACTICIDVEN Microbiology Recent Results (from the past 240 hour(s))  SARS Coronavirus 2 (CEPHEID - Performed in [facility] hospital lab), Hosp Order     Status: None   Collection Time: 04/21/19  5:35 PM   Specimen: Nasopharyngeal Swab  Result Value Ref Range Status   SARS Coronavirus 2 NEGATIVE NEGATIVE Final    Comment: (NOTE) If result is NEGATIVE SARS-CoV-2 target nucleic acids are NOT DETECTED. The SARS-CoV-2 RNA is generally detectable in upper and lower  respiratory specimens during the acute phase of infection. The lowest  concentration of SARS-CoV-2 viral copies this assay can detect is 250  copies / mL. A negative result does not preclude SARS-CoV-2 infection  and should not be used as the sole basis for treatment or other  patient management decisions.  A negative result may occur with  improper specimen collection / handling, submission of specimen other  than nasopharyngeal swab, presence of viral mutation(s) within the  areas targeted by this assay, and inadequate number of viral copies  (<250 copies / mL). A negative result must be combined with clinical  observations, patient history, and epidemiological information. If result is POSITIVE SARS-CoV-2 target nucleic acids are DETECTED. The SARS-CoV-2 RNA is generally detectable in upper and lower  respiratory specimens dur ing the acute phase of infection.  Positive  results are indicative of active  infection with SARS-CoV-2.  Clinical  correlation with patient history and other diagnostic information is  necessary to determine patient infection status.  Positive results do  not rule out bacterial infection or co-infection with other viruses. If result is PRESUMPTIVE POSTIVE SARS-CoV-2 nucleic acids MAY BE PRESENT.   A presumptive positive result was obtained on the submitted specimen  and confirmed on repeat testing.  While 2019 novel coronavirus  (SARS-CoV-2) nucleic acids may be present in the submitted sample  additional confirmatory testing may be necessary for epidemiological  and / or clinical management purposes  to differentiate between  SARS-CoV-2 and other Sarbecovirus currently known to infect humans.  If clinically indicated additional testing with an alternate test  methodology [phone] is advised. The SARS-CoV-2 RNA is generally  detectable in upper and lower respiratory sp ecimens during the acute  phase of infection. The expected result is Negative. Fact Sheet for Patients:  BoilerBrush.com.cyhttps://www.fda.gov/media/136312/download Fact Sheet for Healthcare Providers: https://pope.com/https://www.fda.gov/media/136313/download This test is not yet approved or cleared by the [place] FDA and has been authorized for detection and/or diagnosis of SARS-CoV-2 by FDA under an Emergency Use Authorization (EUA).  This EUA will remain in effect (meaning this test can be used) for the duration of the COVID-19 declaration under Section 564(b)(1) of the Act, 21 U.S.C. section 360bbb-3(b)(1), unless the authorization is terminated or revoked sooner. Performed at [facility], [address] [place], [place] [address]   MRSA PCR  Screening     Status: None   Collection Time: 04/21/19  6:29 PM   Specimen: Nasal Mucosa; Nasopharyngeal  Result Value Ref Range Status   MRSA by PCR NEGATIVE NEGATIVE Final    Comment:        The GeneXpert MRSA Assay (FDA approved for NASAL specimens only), is one  component of a comprehensive MRSA colonization surveillance program. It is not intended to diagnose MRSA infection nor to guide or monitor treatment for MRSA infections. Performed at [facility], [address] [place], [place] [address]   Urine culture     Status: Abnormal   Collection Time: 04/21/19 10:41 PM   Specimen: Urine, Clean Catch  Result Value Ref Range Status   Specimen Description URINE, CLEAN CATCH  Final   Special Requests   Final    NONE Performed at [facility], [address] [place], [place] [address]    Culture MULTIPLE SPECIES PRESENT, SUGGEST RECOLLECTION (A)  Final   Report Status 04/22/2019 FINAL  Final       Patient was seen and examined on the day of discharge and was found to be in stable condition. Time coordinating discharge: 25 minutes including assessment and coordination of care, as well as examination of the patient.   SIGNED:  Noralee StainJennifer Jezreel Sisk, DO [facility] [email or web address] 04/24/2019, 9:28 AM

## 2019-04-24 NOTE — Progress Notes (Signed)
Nsg Discharge Note  Admit Date:  04/21/2019 Discharge date: 04/24/2019   [name] to be D/C'd Home per MD order.  AVS completed.  Copy for chart, and copy for patient signed, and dated. Patient/caregiver able to verbalize understanding.  Discharge Medication: Allergies as of 04/24/2019      Reactions   Ace Inhibitors Swelling, Other (See Comments)   Angioedema   Keflex [cephalexin] Swelling, Other (See Comments)   Lips became swollwn   Rifadin [rifampin] Swelling, Other (See Comments)   Lips became swollen      Medication List    TAKE these medications   acetaminophen 325 MG tablet Commonly known as: TYLENOL Take 325-650 mg by mouth every 8 (eight) hours as needed (for headaches).   amLODipine 5 MG tablet Commonly known as: NORVASC Take 5 mg by mouth daily.   calcium carbonate 500 MG chewable tablet Commonly known as: TUMS - dosed in mg elemental calcium Chew 1-2 tablets by mouth as needed for indigestion or heartburn.   ferrous sulfate 325 (65 FE) MG tablet Take 1 tablet (325 mg total) by mouth daily.   glucose blood test strip Commonly known as: Accu-Chek Aviva Use as instructed to check once daily DX: E11.9   hydrochlorothiazide 25 MG tablet Commonly known as: HYDRODIURIL Take 25 mg by mouth daily.   metFORMIN 750 MG 24 hr tablet Commonly known as: GLUCOPHAGE-XR Take 2 tablets (1,500 mg total) by mouth daily with breakfast.   metoprolol succinate 50 MG 24 hr tablet Commonly known as: TOPROL-XL Take 50 mg by mouth daily.   pantoprazole 40 MG tablet Commonly known as: PROTONIX Take 1 tablet (40 mg total) by mouth 2 (two) times daily.   simvastatin 20 MG tablet Commonly known as: ZOCOR Take 20 mg by mouth every evening.   SYSTANE OP Place 1 drop into both eyes 2 (two) times daily.   Vitamin D3 125 MCG (5000 UT) Caps Take 5,000 Units by mouth daily.   Xarelto 20 MG Tabs tablet Generic drug: rivaroxaban TAKE 1 TABLET BY MOUTH ONCE DAILY WITH  SUPPER What changed: See the new instructions.       Discharge Assessment: Vitals:   04/24/19 0928 04/24/19 1156  BP: (!) 144/61 128/62  Pulse: 72 71  Resp:  16  Temp:  98 F (36.7 C)  SpO2:  98%   Skin clean, dry and intact without evidence of skin break down, no evidence of skin tears noted. IV catheter discontinued intact. Site without signs and symptoms of complications - no redness or edema noted at insertion site, patient denies c/o pain - only slight tenderness at site.  Dressing with slight pressure applied.  D/c Instructions-Education: Discharge instructions given to patient/family with verbalized understanding. D/c education completed with patient/family including follow up instructions, medication list, d/c activities limitations if indicated, with other d/c instructions as indicated by MD - patient able to verbalize understanding, all questions fully answered. Patient instructed to return to ED, call 911, or call MD for any changes in condition.  Patient escorted via [place], and D/C home via private auto.  Reiss Mowrey Margaretha Sheffield, RN 04/24/2019 4:14 PM  Nsg Discharge Note

## 2019-04-25 LAB — TYPE AND SCREEN
ABO/RH(D): O POS
Antibody Screen: POSITIVE
DAT, IgG: NEGATIVE
Donor AG Type: NEGATIVE
Donor AG Type: NEGATIVE
Donor AG Type: NEGATIVE
Donor AG Type: NEGATIVE
Unit division: 0
Unit division: 0
Unit division: 0
Unit division: 0

## 2019-04-25 LAB — BPAM RBC
Blood Product Expiration Date: 202008052359
Blood Product Expiration Date: 202008052359
Blood Product Expiration Date: 202008142359
Blood Product Expiration Date: 202008142359
ISSUE DATE / TIME: 202007131824
ISSUE DATE / TIME: 202007131824
ISSUE DATE / TIME: 202007142035
ISSUE DATE / TIME: 202007150027
Unit Type and Rh: 5100
Unit Type and Rh: 5100
Unit Type and Rh: 5100
Unit Type and Rh: 5100

## 2019-04-27 ENCOUNTER — Other Ambulatory Visit: Payer: Medicare Other

## 2019-05-01 ENCOUNTER — Encounter: Payer: Self-pay | Admitting: Endocrinology

## 2019-05-01 ENCOUNTER — Ambulatory Visit (INDEPENDENT_AMBULATORY_CARE_PROVIDER_SITE_OTHER): Payer: Medicare Other | Admitting: Endocrinology

## 2019-05-01 ENCOUNTER — Other Ambulatory Visit: Payer: Self-pay

## 2019-05-01 VITALS — BP 134/58 | HR 74 | Ht 64.0 in | Wt 192.6 lb

## 2019-05-01 DIAGNOSIS — E1165 Type 2 diabetes mellitus with hyperglycemia: Secondary | ICD-10-CM | POA: Diagnosis not present

## 2019-05-01 DIAGNOSIS — E042 Nontoxic multinodular goiter: Secondary | ICD-10-CM | POA: Diagnosis not present

## 2019-05-01 NOTE — Progress Notes (Signed)
Patient ID: Janet Williamson, female   DOB: 07-01-1941, 78 y.o.   MRN: [id number]   Reason for Appointment: Diabetes follow-up   History of Present Illness   Type 2 DIABETES MELITUS, date of diagnosis 02/2012     She has had mild diabetes which has been well controlled with metformin ER alone. Has had no side effects from metformin  She also has had diabetes education in 2013 . Oral hypoglycemic drugs: Metformin ER using 750 mg tablets, 1.5 g daily.       Side effects from medications: None  Her A1c has been inaccurate and was last 5.6   Current management, blood sugar patterns and problems identified:    She recently had a GI bleed again and with hemoglobin down to 5.3 A1c has not been done  Her blood sugars are variable and she is still not able to follow her diet well  She thinks her blood sugars are higher when she is eating ice cream and cake but blood sugars are not high consistently  Usually checking blood sugars in the evenings between 5-7 PM and these are variable  Glucose readings in the hospital for looking fairly good  She is tolerating metformin well and is taking this regularly  No change in renal function She is now not able to walk because of left knee pain  Monitors blood glucose:  less than once a day     Glucometer:  Accu-Chek  Blood Glucose readings from monitor download show the following  AVERAGE 150  MORNING range 123, 193 Afternoon and early evening range 105-205  Previous average 143     Previous visits with dietitian: Several years ago  Dinner At 5-6 pm       Physical activity: exercise: walking a little  Wt Readings from Last 3 Encounters:  05/01/19 192 lb 9.6 oz (87.4 kg)  04/24/19 184 lb 3.2 oz (83.6 kg)  03/11/19 188 lb (85.3 kg)     Lab Results  Component Value Date   HGBA1C 5.6 10/27/2018   HGBA1C 5.4 10/01/2018   HGBA1C 6.9 (H) 04/24/2018   Lab Results  Component Value Date   MICROALBUR 0.9 10/30/2018   LDLCALC 79 10/27/2018   CREATININE 0.64 04/23/2019     Other active problems evaluated today: See review of systems   LABS:  No visits with results within 1 Week(s) from this visit.  Latest known visit with results is:  Admission on 04/21/2019, Discharged on 04/24/2019  Component Date Value Ref Range Status  . WBC 04/21/2019 10.9* 4.0 - 10.5 K/uL Final  . RBC 04/21/2019 2.87* 3.87 - 5.11 MIL/uL Final  . Hemoglobin 04/21/2019 5.3* 12.0 - 15.0 g/dL Final   Comment: Reticulocyte Hemoglobin testing may be clinically indicated, consider ordering this additional test [id number] REPEATED TO VERIFY CRITICAL RESULT CALLED TO, READ BACK BY AND VERIFIED WITH: M SCRUGGS,RN 1436 04/21/2019 D BRADLEY   . HCT 04/21/2019 20.3* 36.0 - 46.0 % Final  . MCV 04/21/2019 70.7* 80.0 - 100.0 fL Final  . MCH 04/21/2019 18.5* 26.0 - 34.0 pg Final  . MCHC 04/21/2019 26.1* 30.0 - 36.0 g/dL Final  . RDW 84/13/2440 24.5* 11.5 - 15.5 % Final  . Platelets 04/21/2019 483* 150 - 400 K/uL Final  . nRBC 04/21/2019 0.6* 0.0 - 0.2 % Final   Performed at [facility], [address], [place], [place] [address]  . Sodium 04/21/2019 137  135 - 145 mmol/L Final  . Potassium 04/21/2019 4.2  3.5 - 5.1 mmol/L Final  . Chloride 04/21/2019 99  98 - 111 mmol/L Final  . CO2 04/21/2019 25  22 - 32 mmol/L Final  . Glucose, Bld 04/21/2019 126* 70 - 99 mg/dL Final  . BUN 16/10/960407/14/2020 18  8 - 23 mg/dL Final  . Creatinine, Ser 04/21/2019 0.79  0.44 - 1.00 mg/dL Final  . Calcium 54/09/811907/14/2020 9.8  8.9 - 10.3 mg/dL Final  . GFR calc non Af Amer 04/21/2019 >60  >60 mL/min Final  . GFR calc Af Amer 04/21/2019 >60  >60 mL/min Final  . Anion gap 04/21/2019 13  5 - 15 Final   Performed at [facility], [address] [place], [place] [address]  . ABO/RH(D) 04/21/2019 O POS   Final  . Antibody Screen 04/21/2019 POS   Final  . Sample Expiration 04/21/2019 04/24/2019,2359   Final  . Antibody Identification 04/21/2019 ANTI K    Final  . DAT, IgG 04/21/2019 NEG   Final  . Unit Number 04/21/2019 [id number]   Final  . Blood Component Type 04/21/2019 RED CELLS,LR   Final  . Unit division 04/21/2019 00   Final  . Status [facility] Unit [facility]   [facility]  . Donor AG Type 04/21/2019 NEGATIVE FOR KELL ANTIGEN   Final  . Transfusion Status 04/21/2019 OK TO TRANSFUSE   Final  . Crossmatch Result 04/21/2019 COMPATIBLE   Final  . Unit Number 04/21/2019 [id number]   Final  . Blood Component Type 04/21/2019 RED CELLS,LR   Final  . Unit division 04/21/2019 00   Final  . Status of Unit 04/21/2019 [facility]   Final  . Donor AG Type 04/21/2019 NEGATIVE FOR KELL ANTIGEN   Final  . Transfusion Status 04/21/2019 OK TO TRANSFUSE   Final  . Crossmatch Result 04/21/2019 COMPATIBLE   Final  . Unit Number 04/21/2019 [id number]   Final  . Blood Component Type 04/21/2019 RED CELLS,LR   Final  . Unit division 04/21/2019 00   Final  . Status of Unit 04/21/2019 ISSUED,FINAL   Final  . Donor AG Type 04/21/2019 NEGATIVE FOR KELL ANTIGEN   Final  . Transfusion Status 04/21/2019 OK TO TRANSFUSE   Final  . Crossmatch Result 04/21/2019 COMPATIBLE   Final  . Unit Number 04/21/2019 [id number]   Final  . Blood Component Type 04/21/2019 RED CELLS,LR   Final  . Unit division 04/21/2019 00   Final  . Status of Unit 04/21/2019 ISSUED,FINAL   Final  . Donor AG Type 04/21/2019 NEGATIVE FOR KELL ANTIGEN   Final  . Transfusion Status 04/21/2019 OK TO TRANSFUSE   Final  . Crossmatch Result 04/21/2019 COMPATIBLE   Final  . Fecal Occult Bld 04/21/2019 POSITIVE* NEGATIVE Final  . Prothrombin Time 04/21/2019 18.2* 11.4 - 15.2 seconds Final  . INR 04/21/2019 1.5* 0.8 - 1.2 Final   Comment: (NOTE) INR goal varies based on device and disease states. Performed at [facility], [address] [place], [place] [address]   . Order Confirmation 04/21/2019    Final                   Value:ORDER PROCESSED BY BLOOD BANK Performed  at [facility], [address] [place], [place] [address]   . SARS Coronavirus 2 04/21/2019 NEGATIVE  NEGATIVE Final   Comment: (NOTE) If result is NEGATIVE SARS-CoV-2 target nucleic acids are NOT DETECTED. The SARS-CoV-2 RNA is generally detectable in upper and lower  respiratory specimens during the acute phase of infection. The lowest  concentration of SARS-CoV-2 viral copies this assay can detect is 250  copies / mL. A negative result does not preclude SARS-CoV-2 infection  and should not be used as the sole basis for treatment or other  patient management decisions.  A negative result may occur with  improper specimen collection / handling, submission of specimen other  than nasopharyngeal swab, presence of viral mutation(s) within the  areas targeted by this assay, and inadequate number of viral copies  (<250 copies / mL). A negative result must be combined with clinical  observations, patient history, and epidemiological information. If result is POSITIVE SARS-CoV-2 target nucleic acids are DETECTED. The SARS-CoV-2 RNA is generally detectable in upper and lower  respiratory specimens dur                          ing the acute phase of infection.  Positive  results are indicative of active infection with SARS-CoV-2.  Clinical  correlation with patient history and other diagnostic information is  necessary to determine patient infection status.  Positive results do  not rule out bacterial infection or co-infection with other viruses. If result is PRESUMPTIVE POSTIVE SARS-CoV-2 nucleic acids MAY BE PRESENT.   A presumptive positive result was obtained on the submitted specimen  and confirmed on repeat testing.  While 2019 novel coronavirus  (SARS-CoV-2) nucleic acids may be present in the submitted sample  additional confirmatory testing may be necessary for epidemiological  and / or clinical management purposes  to differentiate between  SARS-CoV-2 and other Sarbecovirus  currently known to infect humans.  If clinically indicated additional testing with an alternate test  methodology [phone]) is advised. The SARS-CoV-2 RNA is generally  detectable in upper and lower respiratory sp                          ecimens during the acute  phase of infection. The expected result is Negative. Fact Sheet for Patients:  BoilerBrush.com.cy Fact Sheet for Healthcare Providers: https://pope.com/ This test is not yet approved or cleared by the [place] FDA and has been authorized for detection and/or diagnosis of SARS-CoV-2 by FDA under an Emergency Use Authorization (EUA).  This EUA will remain in effect (meaning this test can be used) for the duration of the COVID-19 declaration under Section 564(b)(1) of the Act, 21 U.S.C. section 360bbb-3(b)(1), unless the authorization is terminated or revoked sooner. Performed at [facility], [address] [place], [place] [address]   . ISSUE DATE / TIME 04/21/2019 [id number]   Final  . Blood Product Unit Number 04/21/2019 [id number]   Final  . PRODUCT CODE 04/21/2019 [id number]   Final  . Unit Type and Rh 04/21/2019 5100   Final  . Blood Product Expiration Date 04/21/2019 [id number]   Final  . ISSUE DATE / TIME 04/21/2019 [id number]   Final  . Blood Product Unit Number 04/21/2019 [id number]   Final  . PRODUCT CODE 04/21/2019 [id number]   Final  . Unit Type and Rh 04/21/2019 5100   Final  . Blood Product Expiration Date 04/21/2019 [id number]   Final  . ISSUE DATE / TIME 04/21/2019 [id number]   Final  . Blood Product Unit Number 04/21/2019 [id number]   Final  . PRODUCT CODE 04/21/2019 [id number]   Final  . Unit Type and Rh 04/21/2019 5100   Final  . Blood Product  Expiration Date 04/21/2019 [id number]   Final  . ISSUE DATE / TIME 04/21/2019 [id number]   Final  . Blood Product Unit Number 04/21/2019 [id number]   Final  . PRODUCT CODE 04/21/2019  [id number]   Final  . Unit Type and Rh 04/21/2019 5100   Final  . Blood Product Expiration Date 04/21/2019 [id number]   Final  . Specimen Description 04/21/2019 URINE, CLEAN CATCH   Final  . Special Requests 04/21/2019    Final                   Value:NONE Performed at [facility], [address] [place], [place] [address]   . Culture 04/21/2019 MULTIPLE SPECIES PRESENT, SUGGEST RECOLLECTION*  Final  . Report Status 04/21/2019 04/22/2019 FINAL   Final  . MRSA by PCR 04/21/2019 NEGATIVE  NEGATIVE Final   Comment:        The GeneXpert MRSA Assay (FDA approved for NASAL specimens only), is one component of a comprehensive MRSA colonization surveillance program. It is not intended to diagnose MRSA infection nor to guide or monitor treatment for MRSA infections. Performed at [facility], [address], [place], [place] [address]   . WBC 04/22/2019 10.7* 4.0 - 10.5 K/uL Final  . RBC 04/22/2019 3.29* 3.87 - 5.11 MIL/uL Final  . Hemoglobin 04/22/2019 7.6* 12.0 - 15.0 g/dL Final   Comment: REPEATED TO VERIFY POST TRANSFUSION SPECIMEN Reticulocyte Hemoglobin testing may be clinically indicated, consider ordering this additional test [id number]   . HCT 04/22/2019 24.5* 36.0 - 46.0 % Final  . MCV 04/22/2019 74.5* 80.0 - 100.0 fL Final  . MCH 04/22/2019 23.1* 26.0 - 34.0 pg Final  . MCHC 04/22/2019 31.0  30.0 - 36.0 g/dL Final  . RDW 04/59/9774 23.6* 11.5 - 15.5 % Final  . Platelets 04/22/2019 376  150 - 400 K/uL Final  . nRBC 04/22/2019 0.6* 0.0 - 0.2 % Final   Performed at [facility], [address], [place], [place] [address]  . WBC 04/23/2019 8.8  4.0 - 10.5 K/uL Final  . RBC 04/23/2019 3.24* 3.87 - 5.11 MIL/uL Final  . Hemoglobin 04/23/2019 7.5* 12.0 - 15.0 g/dL Final   Comment: Reticulocyte Hemoglobin testing may be clinically indicated, consider ordering this additional test [id number]   . HCT 04/23/2019 24.7* 36.0 - 46.0 % Final  . MCV 04/23/2019 76.2*  80.0 - 100.0 fL Final  . MCH 04/23/2019 23.1* 26.0 - 34.0 pg Final  . MCHC 04/23/2019 30.4  30.0 - 36.0 g/dL Final  . RDW 35/68/6168 23.5* 11.5 - 15.5 % Final  . Platelets 04/23/2019 370  150 - 400 K/uL Final  . nRBC 04/23/2019 0.8* 0.0 - 0.2 % Final  . Neutrophils Relative % 04/23/2019 62  % Final  . Neutro Abs 04/23/2019 5.6  1.7 - 7.7 K/uL Final  . Lymphocytes Relative 04/23/2019 27  % Final  . Lymphs Abs 04/23/2019 2.3  0.7 - 4.0 K/uL Final  . Monocytes Relative 04/23/2019 7  % Final  . Monocytes Absolute 04/23/2019 0.6  0.1 - 1.0 K/uL Final  . Eosinophils Relative 04/23/2019 2  % Final  . Eosinophils Absolute 04/23/2019 0.1  0.0 - 0.5 K/uL Final  . Basophils Relative 04/23/2019 1  % Final  . Basophils Absolute 04/23/2019 0.0  0.0 - 0.1 K/uL Final  . Immature Granulocytes 04/23/2019 1  % Final  . Abs Immature Granulocytes 04/23/2019 0.04  0.00 - 0.07 K/uL Final   Performed at [facility], [address] [place], [place] [address]  . Sodium 04/23/2019 140  135 - 145 mmol/L Final  . Potassium 04/23/2019 3.6  3.5 - 5.1 mmol/L Final  . Chloride 04/23/2019 105  98 - 111 mmol/L Final  . CO2 04/23/2019 23  22 - 32 mmol/L Final  . Glucose, Bld 04/23/2019 92  70 - 99 mg/dL Final  . BUN 60/45/409807/16/2020 7* 8 - 23 mg/dL Final  . Creatinine, Ser 04/23/2019 0.64  0.44 - 1.00 mg/dL Final  . Calcium 11/91/478207/16/2020 9.1  8.9 - 10.3 mg/dL Final  . GFR calc non Af Amer 04/23/2019 >60  >60 mL/min Final  . GFR calc Af Amer 04/23/2019 >60  >60 mL/min Final  . Anion gap 04/23/2019 12  5 - 15 Final   Performed at [facility], [address] [place], [place] [address]  . Glucose-Capillary 04/23/2019 102* 70 - 99 mg/dL Final  . WBC 30/86/578407/17/2020 8.8  4.0 - 10.5 K/uL Final  . RBC 04/24/2019 3.27* 3.87 - 5.11 MIL/uL Final  . Hemoglobin 04/24/2019 7.5* 12.0 - 15.0 g/dL Final   Comment: Reticulocyte Hemoglobin testing may be clinically indicated, consider ordering this additional test [id number]   .  HCT 04/24/2019 24.9* 36.0 - 46.0 % Final  . MCV 04/24/2019 76.1* 80.0 - 100.0 fL Final  . MCH 04/24/2019 22.9* 26.0 - 34.0 pg Final  . MCHC 04/24/2019 30.1  30.0 - 36.0 g/dL Final  . RDW 41/32/440107/17/2020 24.6* 11.5 - 15.5 % Final  . Platelets 04/24/2019 382  150 - 400 K/uL Final  . nRBC 04/24/2019 0.2  0.0 - 0.2 % Final   Performed at [facility], [address] [place], [place] [address]    Allergies as of 05/01/2019      Reactions   Ace Inhibitors Swelling, Other (See Comments)   Angioedema   Keflex [cephalexin] Swelling, Other (See Comments)   Lips became swollwn   Rifadin [rifampin] Swelling, Other (See Comments)   Lips became swollen      Medication List       Accurate as of May 01, 2019  9:06 AM. If you have any questions, ask your nurse or doctor.        acetaminophen 325 MG tablet Commonly known as: TYLENOL Take 325-650 mg by mouth every 8 (eight) hours as needed (for headaches).   amLODipine 5 MG tablet Commonly known as: NORVASC Take 5 mg by mouth daily.   calcium carbonate 500 MG chewable tablet Commonly known as: TUMS - dosed in mg elemental calcium Chew 1-2 tablets by mouth as needed for indigestion or heartburn.   ferrous sulfate 325 (65 FE) MG tablet Take 1 tablet (325 mg total) by mouth daily.   glucose blood test strip Commonly known as: Accu-Chek Aviva Use as instructed to check once daily DX: E11.9   hydrochlorothiazide 25 MG tablet Commonly known as: HYDRODIURIL Take 25 mg by mouth daily.   metFORMIN 750 MG 24 hr tablet Commonly known as: GLUCOPHAGE-XR Take 2 tablets (1,500 mg total) by mouth daily with breakfast.   metoprolol succinate 50 MG 24 hr tablet Commonly known as: TOPROL-XL Take 50 mg by mouth daily.   pantoprazole 40 MG tablet Commonly known as: PROTONIX Take 1 tablet (40 mg total) by mouth 2 (two) times daily.   simvastatin 20 MG tablet Commonly known as: ZOCOR Take 20 mg by mouth every evening.   SYSTANE OP Place 1  drop into both eyes 2 (two)  times daily.   Vitamin D3 125 MCG (5000 UT) Caps Take 5,000 Units by mouth daily.   Xarelto 20 MG Tabs tablet Generic drug: rivaroxaban TAKE 1 TABLET BY MOUTH ONCE DAILY WITH SUPPER What changed: See the new instructions.       Allergies:  Allergies  Allergen Reactions  . Ace Inhibitors Swelling and Other (See Comments)    Angioedema   . Keflex [Cephalexin] Swelling and Other (See Comments)    Lips became swollwn  . Rifadin [Rifampin] Swelling and Other (See Comments)    Lips became swollen    Past Medical History:  Diagnosis Date  . Anemia   . Cardiac mass    a. 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 Not seen on most recent TEE 2019.  . Cardiomyopathy in other disease   . Cataracts, bilateral   . Diabetes mellitus    Type 2  . Generalized osteoarthritis   . GERD (gastroesophageal reflux disease)   . HLD (hyperlipidemia)   . Hypertension   . Hypothyroidism   . LBBB (left bundle branch block)   . PAF (paroxysmal atrial fibrillation) (HCC)    a. s/p DCCV 06/2017, on Xarelto  . Phlebitis   . S/P TAVR (transcatheter aortic valve replacement) 04/15/2018   Edwards Sapien 3 THV (size 23 mm, model # 9600TFX, serial # [id number]) via the TF approach  . Severe aortic stenosis    a. s/p TAVR 04/2018.  . Stroke [facility](HCC) 2008    Past Surgical History:  Procedure Laterality Date  . ABDOMINAL HYSTERECTOMY    . COLONOSCOPY    . COLONOSCOPY WITH PROPOFOL N/A 10/02/2018   Procedure: COLONOSCOPY WITH PROPOFOL;  Surgeon: Willis Modenautlaw, William, MD;  [facility];  Service: Endoscopy;  Laterality: N/A;  . ESOPHAGOGASTRODUODENOSCOPY (EGD) WITH PROPOFOL N/A 10/02/2018   Procedure: ESOPHAGOGASTRODUODENOSCOPY (EGD) WITH PROPOFOL;  Surgeon: Willis Modenautlaw, William, MD;  [facility];  Service: Endoscopy;  Laterality: N/A;  . ESOPHAGOGASTRODUODENOSCOPY (EGD) WITH PROPOFOL N/A 04/23/2019   Procedure: ESOPHAGOGASTRODUODENOSCOPY (EGD) WITH PROPOFOL;  Surgeon:  Graylin ShiverGanem, Salem F, MD;  [facility];  Service: Endoscopy;  Laterality: N/A;  . EYE SURGERY Bilateral    cataract removal  . GIVENS CAPSULE STUDY N/A 10/02/2018   Procedure: GIVENS CAPSULE STUDY;  Surgeon: Willis Modenautlaw, William, MD;  [facility];  Service: Endoscopy;  Laterality: N/A;  . RIGHT/LEFT HEART CATH AND CORONARY ANGIOGRAPHY N/A 03/06/2018   Procedure: RIGHT/LEFT HEART CATH AND CORONARY ANGIOGRAPHY;  Surgeon: Lyn RecordsSmith, Henry W, MD;  Location: [facility] INVASIVE CV LAB;  Service: Cardiovascular;  Laterality: N/A;  . TEE WITHOUT CARDIOVERSION N/A 04/01/2018   Procedure: TRANSESOPHAGEAL ECHOCARDIOGRAM (TEE);  Surgeon: Jake BatheSkains, Mark C, MD;  [facility];  Service: Cardiovascular;  Laterality: N/A;  . TEE WITHOUT CARDIOVERSION N/A 04/15/2018   Procedure: TRANSESOPHAGEAL ECHOCARDIOGRAM (TEE);  Surgeon: Tonny Bollmanooper, Michael, MD;  [facility];  Service: Open Heart Surgery;  Laterality: N/A;  . TOTAL KNEE ARTHROPLASTY Right 04/14/2013   Dr August Saucerean  . TOTAL KNEE ARTHROPLASTY Right 04/14/2013   Procedure: TOTAL KNEE ARTHROPLASTY;  Surgeon: Cammy CopaGregory Scott Dean, MD;  [facility];  Service: Orthopedics;  Laterality: Right;  . TRANSCATHETER AORTIC VALVE REPLACEMENT, TRANSFEMORAL  04/15/2018  . TRANSCATHETER AORTIC VALVE REPLACEMENT, TRANSFEMORAL Bilateral 04/15/2018   Procedure: TRANSCATHETER AORTIC VALVE REPLACEMENT, TRANSFEMORAL;  Surgeon: Tonny Bollmanooper, Michael, MD;  [facility];  Service: Open Heart Surgery;  Laterality: Bilateral;    Family History  Problem Relation Age of Onset  . Stroke Mother   . Hypertension Mother   .  Hypertension Father   . Heart attack Neg Hx     Social History:  reports that she has never smoked. She has never used smokeless tobacco. She reports that she does not drink alcohol or use drugs.  Review of Systems:  HYPERTENSION:  she has been managed with 5 mg amlodipine, 50 mg metoprolol and hydrochlorothiazide 25 mg, followed by PCP.  She had swelling of the  lips with ramipril and has not been prescribed an ARB   HYPERLIPIDEMIA: The lipid abnormality consists of elevated LDL treated with simvastatin 20 mg by PCP and LDL is controlled as follows   Lab Results  Component Value Date   CHOL 133 10/27/2018   HDL 39.60 10/27/2018   LDLCALC 79 10/27/2018   TRIG 73.0 10/27/2018   CHOLHDL 3 10/27/2018    Multinodular goiter:  She has had a long-standing multinodular goiter, last ultrasound was done in 05/2012 which showed the largest nodule to be 4.1 cm nodule in the isthmus Her exam has been fairly stable  TSH has been normal  Lab Results  Component Value Date   TSH 1.09 04/24/2018    No history of numbness or tingling in her feet Foot exam done in 7/19 by her PCP  Last eye exam was in 12/2018, report not available  HYPERCALCEMIA: has had only 1 previous high calcium She does take 25 mg HCTZ PTH normal  Calcium subsequently has been normal   Lab Results  Component Value Date   CALCIUM 9.1 04/23/2019   CALCIUM 9.8 04/21/2019   CALCIUM 10.1 03/13/2019   CALCIUM 10.1 10/27/2018   CALCIUM 10.5 (H) 10/17/2018    Lab Results  Component Value Date   PTH 23 08/22/2017   CALCIUM 9.1 04/23/2019   CAION 1.14 (L) 04/15/2018     Examination: BP (!) 134/58 (BP Location: Left Arm, Patient Position: Sitting, Cuff Size: Normal)   Pulse 74   Ht 5\' 4"  (1.626 m)   Wt 192 lb 9.6 oz (87.4 kg)   SpO2 99%   BMI 33.06 kg/m   Body mass index is 33.06 kg/m.   Appears to have mild lower leg edema   ASSESSMENT/ PLAN:   Diabetes type 2 with obesity See history of present illness for discussion of current diabetes management, blood sugar patterns and problems identified  Her A1c was not checked because of her recent severe anemia and blood transfusion  Her home blood sugars are about the same as usual and hospital readings are fairly good She does have some high readings at home when eating sweets as before Recently gaining weight and  not able to exercise much because of knee pain  Discussed cutting back on portions of her sweets She will need to try and walk when she is not having as much problems with her left leg  We will continue her 1500 mg metformin for now as monotherapy  Follow-up in 3 months   GOITER: She has a longstanding multinodular goiter and will check her TSH on the next visit  HYPERTENSION: Well controlled    Elayne Snare 05/01/2019, 9:06 AM   Note: This office note was prepared with Dragon voice recognition system technology. Any transcriptional errors that result from this process are unintentional.

## 2019-05-03 ENCOUNTER — Other Ambulatory Visit: Payer: Self-pay | Admitting: Endocrinology

## 2019-05-04 ENCOUNTER — Other Ambulatory Visit: Payer: Self-pay

## 2019-05-04 MED ORDER — METFORMIN HCL ER 750 MG PO TB24: ORAL_TABLET | ORAL | 1 refills | Status: DC

## 2019-05-09 DIAGNOSIS — K922 Gastrointestinal hemorrhage, unspecified: Secondary | ICD-10-CM

## 2019-05-09 HISTORY — DX: Gastrointestinal hemorrhage, unspecified: K92.2

## 2019-05-15 ENCOUNTER — Other Ambulatory Visit: Payer: Self-pay | Admitting: Cardiology

## 2019-05-15 NOTE — Telephone Encounter (Signed)
Xarelto 20mg  refill request received; pt is 78 yrs old, wt-87.4kg, Crea-0.64 on 04/23/2019, last seen by Bonney Leitz on 03/11/2019, Diagnosis Afib, CrCl-99.55ml/min; will send in refill.

## 2019-05-17 ENCOUNTER — Other Ambulatory Visit: Payer: Self-pay

## 2019-05-17 ENCOUNTER — Observation Stay (HOSPITAL_COMMUNITY)
Admission: EM | Admit: 2019-05-17 | Discharge: 2019-05-19 | Disposition: A | Discharge status: Home / Self Care | Payer: Medicare Other | Attending: Family Medicine | Admitting: Family Medicine

## 2019-05-17 ENCOUNTER — Emergency Department (HOSPITAL_COMMUNITY): Payer: Medicare Other

## 2019-05-17 ENCOUNTER — Encounter (HOSPITAL_COMMUNITY): Payer: Self-pay | Admitting: Emergency Medicine

## 2019-05-17 DIAGNOSIS — E785 Hyperlipidemia, unspecified: Secondary | ICD-10-CM | POA: Insufficient documentation

## 2019-05-17 DIAGNOSIS — I429 Cardiomyopathy, unspecified: Secondary | ICD-10-CM | POA: Diagnosis not present

## 2019-05-17 DIAGNOSIS — I480 Paroxysmal atrial fibrillation: Secondary | ICD-10-CM | POA: Diagnosis present

## 2019-05-17 DIAGNOSIS — E039 Hypothyroidism, unspecified: Secondary | ICD-10-CM | POA: Diagnosis not present

## 2019-05-17 DIAGNOSIS — K922 Gastrointestinal hemorrhage, unspecified: Principal | ICD-10-CM | POA: Insufficient documentation

## 2019-05-17 DIAGNOSIS — Z952 Presence of prosthetic heart valve: Secondary | ICD-10-CM | POA: Insufficient documentation

## 2019-05-17 DIAGNOSIS — Z79899 Other long term (current) drug therapy: Secondary | ICD-10-CM | POA: Diagnosis not present

## 2019-05-17 DIAGNOSIS — I10 Essential (primary) hypertension: Secondary | ICD-10-CM | POA: Diagnosis not present

## 2019-05-17 DIAGNOSIS — Z8673 Personal history of transient ischemic attack (TIA), and cerebral infarction without residual deficits: Secondary | ICD-10-CM | POA: Insufficient documentation

## 2019-05-17 DIAGNOSIS — E875 Hyperkalemia: Secondary | ICD-10-CM | POA: Insufficient documentation

## 2019-05-17 DIAGNOSIS — D649 Anemia, unspecified: Secondary | ICD-10-CM | POA: Diagnosis present

## 2019-05-17 DIAGNOSIS — E119 Type 2 diabetes mellitus without complications: Secondary | ICD-10-CM

## 2019-05-17 DIAGNOSIS — Z7901 Long term (current) use of anticoagulants: Secondary | ICD-10-CM | POA: Insufficient documentation

## 2019-05-17 DIAGNOSIS — Z7984 Long term (current) use of oral hypoglycemic drugs: Secondary | ICD-10-CM | POA: Diagnosis not present

## 2019-05-17 DIAGNOSIS — I350 Nonrheumatic aortic (valve) stenosis: Secondary | ICD-10-CM | POA: Diagnosis not present

## 2019-05-17 DIAGNOSIS — Z20828 Contact with and (suspected) exposure to other viral communicable diseases: Secondary | ICD-10-CM | POA: Diagnosis not present

## 2019-05-17 DIAGNOSIS — I447 Left bundle-branch block, unspecified: Secondary | ICD-10-CM | POA: Diagnosis not present

## 2019-05-17 DIAGNOSIS — K219 Gastro-esophageal reflux disease without esophagitis: Secondary | ICD-10-CM | POA: Insufficient documentation

## 2019-05-17 DIAGNOSIS — Z8719 Personal history of other diseases of the digestive system: Secondary | ICD-10-CM | POA: Diagnosis present

## 2019-05-17 DIAGNOSIS — I700 Atherosclerosis of aorta: Secondary | ICD-10-CM | POA: Insufficient documentation

## 2019-05-17 DIAGNOSIS — D62 Acute posthemorrhagic anemia: Secondary | ICD-10-CM | POA: Insufficient documentation

## 2019-05-17 DIAGNOSIS — K921 Melena: Secondary | ICD-10-CM

## 2019-05-17 LAB — CBC
HCT: 27 % — ABNORMAL LOW (ref 36.0–46.0)
HCT: 24.5 % — ABNORMAL LOW (ref 36.0–46.0)
Hemoglobin: 7.9 g/dL — ABNORMAL LOW (ref 12.0–15.0)
Hemoglobin: 7.3 g/dL — ABNORMAL LOW (ref 12.0–15.0)
MCH: 23.5 pg — ABNORMAL LOW (ref 26.0–34.0)
MCH: 23.5 pg — ABNORMAL LOW (ref 26.0–34.0)
MCHC: 29.3 g/dL — ABNORMAL LOW (ref 30.0–36.0)
MCHC: 29.8 g/dL — ABNORMAL LOW (ref 30.0–36.0)
MCV: 80.4 fL (ref 80.0–100.0)
MCV: 79 fL — ABNORMAL LOW (ref 80.0–100.0)
Platelets: 422 10*3/uL — ABNORMAL HIGH (ref 150–400)
Platelets: 358 10*3/uL (ref 150–400)
RBC: 3.36 MIL/uL — ABNORMAL LOW (ref 3.87–5.11)
RBC: 3.1 MIL/uL — ABNORMAL LOW (ref 3.87–5.11)
RDW: 22.4 % — ABNORMAL HIGH (ref 11.5–15.5)
RDW: 22.1 % — ABNORMAL HIGH (ref 11.5–15.5)
WBC: 9.3 10*3/uL (ref 4.0–10.5)
WBC: 10.3 10*3/uL (ref 4.0–10.5)
nRBC: 0.2 % (ref 0.0–0.2)
nRBC: 0 % (ref 0.0–0.2)

## 2019-05-17 LAB — HEMOGLOBIN A1C
Hgb A1c MFr Bld: 4.7 % — ABNORMAL LOW (ref 4.8–5.6)
Mean Plasma Glucose: 88.19 mg/dL

## 2019-05-17 LAB — COMPREHENSIVE METABOLIC PANEL
ALT: 10 U/L (ref 0–44)
AST: 16 U/L (ref 15–41)
Albumin: 3.3 g/dL — ABNORMAL LOW (ref 3.5–5.0)
Alkaline Phosphatase: 49 U/L (ref 38–126)
Anion gap: 11 (ref 5–15)
BUN: 19 mg/dL (ref 8–23)
CO2: 26 mmol/L (ref 22–32)
Calcium: 9.8 mg/dL (ref 8.9–10.3)
Chloride: 103 mmol/L (ref 98–111)
Creatinine, Ser: 0.8 mg/dL (ref 0.44–1.00)
GFR calc Af Amer: >60 mL/min (ref >60)
GFR calc non Af Amer: >60 mL/min (ref >60)
Glucose, Bld: 132 mg/dL — ABNORMAL HIGH (ref 70–99)
Potassium: 3.3 mmol/L — ABNORMAL LOW (ref 3.5–5.1)
Sodium: 140 mmol/L (ref 135–145)
Total Bilirubin: 0.3 mg/dL (ref 0.3–1.2)
Total Protein: 7.5 g/dL (ref 6.5–8.1)

## 2019-05-17 LAB — POC OCCULT BLOOD, ED: Fecal Occult Bld: POSITIVE — AB

## 2019-05-17 LAB — PREPARE RBC (CROSSMATCH)

## 2019-05-17 LAB — TROPONIN I (HIGH SENSITIVITY)
Troponin I (High Sensitivity): 11 ng/L (ref <18)
Troponin I (High Sensitivity): 13 ng/L (ref <18)

## 2019-05-17 MED ORDER — HYDROCHLOROTHIAZIDE 25 MG PO TABS
25.0000 mg | ORAL_TABLET | Freq: Every day | ORAL | Status: DC
  Administered 2019-05-18 – 2019-05-19 (×2): 25 mg via ORAL
  Filled 2019-05-17 (×2): qty 1

## 2019-05-17 MED ORDER — POTASSIUM CHLORIDE CRYS ER 20 MEQ PO TBCR
40.0000 meq | EXTENDED_RELEASE_TABLET | Freq: Once | ORAL | Status: AC
  Administered 2019-05-18: 40 meq via ORAL
  Filled 2019-05-17: qty 2

## 2019-05-17 MED ORDER — METOPROLOL SUCCINATE ER 50 MG PO TB24
50.0000 mg | ORAL_TABLET | Freq: Every day | ORAL | Status: DC
  Administered 2019-05-18 – 2019-05-19 (×2): 50 mg via ORAL
  Filled 2019-05-17 (×2): qty 1

## 2019-05-17 MED ORDER — ONDANSETRON HCL 4 MG/2ML IJ SOLN: 4.0000 mg | Freq: Four times a day (QID) | INTRAMUSCULAR | Status: DC | PRN

## 2019-05-17 MED ORDER — INSULIN ASPART 100 UNIT/ML ~~LOC~~ SOLN: 0.0000 [IU] | Freq: Every day | SUBCUTANEOUS | Status: DC

## 2019-05-17 MED ORDER — FERROUS SULFATE 325 (65 FE) MG PO TABS
325.0000 mg | ORAL_TABLET | Freq: Three times a day (TID) | ORAL | Status: DC
  Administered 2019-05-18 – 2019-05-19 (×4): 325 mg via ORAL
  Filled 2019-05-17 (×4): qty 1

## 2019-05-17 MED ORDER — AMLODIPINE BESYLATE 5 MG PO TABS
5.0000 mg | ORAL_TABLET | Freq: Every day | ORAL | Status: DC
  Administered 2019-05-18 – 2019-05-19 (×2): 5 mg via ORAL
  Filled 2019-05-17 (×2): qty 1

## 2019-05-17 MED ORDER — SODIUM CHLORIDE 0.9% IV SOLUTION: Freq: Once | INTRAVENOUS | Status: DC

## 2019-05-17 MED ORDER — SIMVASTATIN 20 MG PO TABS
20.0000 mg | ORAL_TABLET | Freq: Every evening | ORAL | Status: DC
  Administered 2019-05-18 (×2): 20 mg via ORAL
  Filled 2019-05-17 (×2): qty 1

## 2019-05-17 MED ORDER — METFORMIN HCL ER 750 MG PO TB24
1500.0000 mg | ORAL_TABLET | Freq: Every day | ORAL | Status: DC
  Administered 2019-05-18: 1500 mg via ORAL
  Filled 2019-05-17 (×3): qty 2

## 2019-05-17 MED ORDER — PANTOPRAZOLE SODIUM 40 MG IV SOLR
40.0000 mg | Freq: Two times a day (BID) | INTRAVENOUS | Status: DC
  Administered 2019-05-18 – 2019-05-19 (×3): 40 mg via INTRAVENOUS
  Filled 2019-05-17 (×3): qty 40

## 2019-05-17 MED ORDER — INSULIN ASPART 100 UNIT/ML ~~LOC~~ SOLN: 0.0000 [IU] | Freq: Three times a day (TID) | SUBCUTANEOUS | Status: DC

## 2019-05-17 MED ORDER — ONDANSETRON HCL 4 MG PO TABS: 4.0000 mg | ORAL_TABLET | Freq: Four times a day (QID) | ORAL | Status: DC | PRN

## 2019-05-17 NOTE — ED Notes (Signed)
Pt ambulated to the bathroom and back to room with no assistance or difficulty. Pt denied feeling lightheaded or dizzy but did say she was a little short of breath

## 2019-05-17 NOTE — ED Triage Notes (Signed)
Pt here for eval of shortness of breath and dark stools. Recent admit for GI bleeding.

## 2019-05-17 NOTE — ED Notes (Signed)
ED TO INPATIENT HANDOFF REPORT  ED Nurse Name and Phone #:  Baird LyonsCasey [phone]  S Name/Age/Gender Janet Williamson 78 y.o. female Room/Bed: 041C/041C  Code Status   Code Status: Prior  Home/SNF/Other Home Patient oriented to: self, place, time and situation Is this baseline? Yes   Triage Complete: Triage complete  Chief Complaint shob  Triage Note Pt here for eval of shortness of breath and dark stools. Recent admit for GI bleeding.    Allergies Allergies  Allergen Reactions  . Ace Inhibitors Swelling and Other (See Comments)    Angioedema   . Keflex [Cephalexin] Swelling and Other (See Comments)    Lips became swollwn  . Rifadin [Rifampin] Swelling and Other (See Comments)    Lips became swollen    Level of Care/Admitting Diagnosis ED Disposition    ED Disposition Condition Comment   Admit  [facility] [[id number][facility]  Level of Care: Telemetry Medical [104]  I expect the patient will be discharged within 24 hours: Yes  LOW acuity---Tx typically complete <24 hrs---ACUTE conditions typically can be evaluated <24 hours---LABS likely to return to acceptable levels <24 hours---IS near functional baseline---EXPECTED to return to current living arrangement---NOT newly hypoxic: Meets criteria for 5C-Observation unit  Covid Evaluation: Asymptomatic Screening Protocol (No Symptoms)  Diagnosis: Symptomatic anemia [[id number]][[id number]]  Admitting Physician: Levie HeritageSTINSON, JACOB J [[id number]]  Attending Physician: Levie HeritageSTINSON, JACOB J [[id number]]  PT Class (Do Not Modify): Observation [104]  PT Acc Code (Do Not Modify): Observation [10022]       B Medical/Surgery History Past Medical History:  Diagnosis Date  . Anemia   . Cardiac mass    a. 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 Not seen on most recent TEE 2019.  . Cardiomyopathy in other disease   . Cataracts, bilateral   . Diabetes mellitus    Type 2  . Generalized osteoarthritis   . GERD (gastroesophageal reflux  disease)   . HLD (hyperlipidemia)   . Hypertension   . Hypothyroidism   . LBBB (left bundle branch block)   . PAF (paroxysmal atrial fibrillation) (HCC)    a. s/p DCCV 06/2017, on Xarelto  . Phlebitis   . S/P TAVR (transcatheter aortic valve replacement) 04/15/2018   Edwards Sapien 3 THV (size 23 mm, model # 9600TFX, serial # [id number]) via the TF approach  . Severe aortic stenosis    a. s/p TAVR 04/2018.  . Stroke [facility](HCC) 2008   Past Surgical History:  Procedure Laterality Date  . ABDOMINAL HYSTERECTOMY    . COLONOSCOPY    . COLONOSCOPY WITH PROPOFOL N/A 10/02/2018   Procedure: COLONOSCOPY WITH PROPOFOL;  Surgeon: Willis Modenautlaw, William, MD;  [facility];  Service: Endoscopy;  Laterality: N/A;  . ESOPHAGOGASTRODUODENOSCOPY (EGD) WITH PROPOFOL N/A 10/02/2018   Procedure: ESOPHAGOGASTRODUODENOSCOPY (EGD) WITH PROPOFOL;  Surgeon: Willis Modenautlaw, William, MD;  [facility];  Service: Endoscopy;  Laterality: N/A;  . ESOPHAGOGASTRODUODENOSCOPY (EGD) WITH PROPOFOL N/A 04/23/2019   Procedure: ESOPHAGOGASTRODUODENOSCOPY (EGD) WITH PROPOFOL;  Surgeon: Graylin ShiverGanem, Salem F, MD;  [facility];  Service: Endoscopy;  Laterality: N/A;  . EYE SURGERY Bilateral    cataract removal  . GIVENS CAPSULE STUDY N/A 10/02/2018   Procedure: GIVENS CAPSULE STUDY;  Surgeon: Willis Modenautlaw, William, MD;  [facility];  Service: Endoscopy;  Laterality: N/A;  . RIGHT/LEFT HEART CATH AND CORONARY ANGIOGRAPHY N/A 03/06/2018   Procedure: RIGHT/LEFT HEART CATH AND CORONARY ANGIOGRAPHY;  Surgeon: Lyn RecordsSmith, Henry W, MD;  [facility] IN[facility] LAB;  Service: Cardiovascular;  Laterality:  N/A;  . TEE WITHOUT CARDIOVERSION N/A 04/01/2018   Procedure: TRANSESOPHAGEAL ECHOCARDIOGRAM (TEE);  Surgeon: Jerline Pain, MD;  Location: [facility] ENDOSCOPY;  Service: Cardiovascular;  Laterality: N/A;  . TEE WITHOUT CARDIOVERSION N/A 04/15/2018   Procedure: TRANSESOPHAGEAL ECHOCARDIOGRAM (TEE);  Surgeon: Sherren Mocha, MD;  Location: [place];  Service: Open Heart Surgery;  Laterality: N/A;  . TOTAL KNEE ARTHROPLASTY Right 04/14/2013   Dr Marlou Sa  . TOTAL KNEE ARTHROPLASTY Right 04/14/2013   Procedure: TOTAL KNEE ARTHROPLASTY;  Surgeon: Meredith Pel, MD;  Location: [place];  Service: Orthopedics;  Laterality: Right;  . TRANSCATHETER AORTIC VALVE REPLACEMENT, TRANSFEMORAL  04/15/2018  . TRANSCATHETER AORTIC VALVE REPLACEMENT, TRANSFEMORAL Bilateral 04/15/2018   Procedure: TRANSCATHETER AORTIC VALVE REPLACEMENT, TRANSFEMORAL;  Surgeon: Sherren Mocha, MD;  Location: [place];  Service: Open Heart Surgery;  Laterality: Bilateral;     A IV Location/Drains/Wounds Patient Lines/Drains/Airways Status   Active Line/Drains/Airways    Name:   Placement date:   Placement time:   Site:   Days:   Peripheral IV 05/17/19 Left Forearm   05/17/19    1850    Forearm   less than 1   Incision 04/14/13 Leg Right   04/14/13    1014     2224   Incision (Closed) 04/15/18 Groin Right   04/15/18    1138     397   Incision (Closed) 04/15/18 Groin Left   04/15/18    1138     397          Intake/Output Last 24 hours No intake or output data in the 24 hours ending 05/17/19 1908  Labs/Imaging Results for orders placed or performed during the hospital encounter of 05/17/19 (from the past 48 hour(s))  Type and screen [place]     Status: None (Preliminary result)   Collection Time: 05/17/19  1:40 PM  Result Value Ref Range   ABO/RH(D) O POS    Antibody Screen POS    Sample Expiration 05/20/2019,2359    Antibody Identification      ANTI K Performed at [facility], [place] [address], [place], [place] [address]    Unit Number [id number]    Blood Component Type RED CELLS,LR    Unit division 00    Status of Unit ALLOCATED    Donor AG Type NEGATIVE FOR KELL ANTIGEN    Transfusion Status OK TO TRANSFUSE    Crossmatch Result COMPATIBLE    Unit Number [id number]    Blood Component Type RED CELLS,LR    Unit division 00     Status of Unit ALLOCATED    Donor AG Type NEGATIVE FOR KELL ANTIGEN    Transfusion Status OK TO TRANSFUSE    Crossmatch Result COMPATIBLE   Comprehensive metabolic panel     Status: Abnormal   Collection Time: 05/17/19  1:41 PM  Result Value Ref Range   Sodium 140 135 - 145 mmol/L   Potassium 3.3 (L) 3.5 - 5.1 mmol/L   Chloride 103 98 - 111 mmol/L   CO2 26 22 - 32 mmol/L   Glucose, Bld 132 (H) 70 - 99 mg/dL   BUN 19 8 - 23 mg/dL   Creatinine, Ser 0.80 0.44 - 1.00 mg/dL   Calcium 9.8 8.9 - 10.3 mg/dL   Total Protein 7.5 6.5 - 8.1 g/dL   Albumin 3.3 (L) 3.5 - 5.0 g/dL   AST 16 15 - 41 U/L   ALT 10 0 - 44 U/L  Alkaline Phosphatase 49 38 - 126 U/L   Total Bilirubin 0.3 0.3 - 1.2 mg/dL   GFR calc non Af Amer >60 >60 mL/min   GFR calc Af Amer >60 >60 mL/min   Anion gap 11 5 - 15    Comment: Performed at [facility], [address], [place], [place] [address]  CBC     Status: Abnormal   Collection Time: 05/17/19  1:41 PM  Result Value Ref Range   WBC 9.3 4.0 - 10.5 K/uL   RBC 3.36 (L) 3.87 - 5.11 MIL/uL   Hemoglobin 7.9 (L) 12.0 - 15.0 g/dL   HCT 37.9 (L) 02.4 - 09.7 %   MCV 80.4 80.0 - 100.0 fL   MCH 23.5 (L) 26.0 - 34.0 pg   MCHC 29.3 (L) 30.0 - 36.0 g/dL   RDW 35.3 (H) 29.9 - 24.2 %   Platelets 422 (H) 150 - 400 K/uL   nRBC 0.2 0.0 - 0.2 %    Comment: Performed at [facility], [address], [place], [place] [address]  Troponin I (High Sensitivity)     Status: None   Collection Time: 05/17/19  1:41 PM  Result Value Ref Range   Troponin I (High Sensitivity) 11 <18 ng/L    Comment: (NOTE) Elevated high sensitivity troponin I (hsTnI) values and significant  changes across serial measurements may suggest ACS but many other  chronic and acute conditions are known to elevate hsTnI results.  Refer to the "Links" section for chest pain algorithms and additional  guidance. Performed at [facility], [address] [place], [place] [address]   POC  occult blood, ED     Status: Abnormal   Collection Time: 05/17/19  3:04 PM  Result Value Ref Range   Fecal Occult Bld POSITIVE (A) NEGATIVE  Troponin I (High Sensitivity)     Status: None   Collection Time: 05/17/19  5:18 PM  Result Value Ref Range   Troponin I (High Sensitivity) 13 <18 ng/L    Comment: (NOTE) Elevated high sensitivity troponin I (hsTnI) values and significant  changes across serial measurements may suggest ACS but many other  chronic and acute conditions are known to elevate hsTnI results.  Refer to the "Links" section for chest pain algorithms and additional  guidance. Performed at [facility], [address], [place], [place] [address]   Prepare RBC     Status: None   Collection Time: 05/17/19  7:00 PM  Result Value Ref Range   Order Confirmation      ORDER PROCESSED BY BLOOD BANK Performed at [facility], [address], [place], [place] [address]    Dg Chest Portable 1 View  Result Date: 05/17/2019 CLINICAL DATA:  78 year old female with history of shortness of breath. EXAM: PORTABLE CHEST 1 VIEW COMPARISON:  Chest x-ray 04/21/2019. FINDINGS: Lung volumes are normal. No consolidative airspace disease. No pleural effusions. No pneumothorax. No pulmonary nodule or mass noted. Pulmonary vasculature and the cardiomediastinal silhouette are within normal limits. Atherosclerosis in the thoracic aorta. Sapien type transcatheter aortic valve incidentally noted. IMPRESSION: 1.  No radiographic evidence of acute cardiopulmonary disease. 2. Aortic atherosclerosis. 3. Status post TAVR. Electronically Signed   By: Trudie Reed M.D.   On: 05/17/2019 15:27    Pending Labs Unresulted Labs (From admission, onward)    Start     Ordered   05/17/19 1853  SARS CORONAVIRUS 2 Nasal Swab Aptima Multi Swab  (  Asymptomatic/Tier 2 Patients Labs)  Once,   STAT    Question Answer Comment  Is this test for diagnosis or screening Screening   Symptomatic for COVID-19 as  defined by CDC No   Hospitalized for COVID-19 No   Admitted to ICU for COVID-19 No   Previously tested for COVID-19 Yes   Resident in a congregate (group) care setting No   Employed in healthcare setting No   Pregnant No      05/17/19 1853   Signed and Held  Hemoglobin A1c  Once,   R    Comments: To assess prior glycemic control    Signed and Held   Signed and Held  CBC  Once,   R     Signed and Held   Signed and Held  Basic metabolic panel  Tomorrow morning,   R     Signed and Held   Signed and Held  CBC  Tomorrow morning,   R     Signed and Held          Vitals/Pain Today's Vitals   05/17/19 1645 05/17/19 1745 05/17/19 1800 05/17/19 1815  BP: (!) 126/55 (!) 135/58 (!) 118/96 124/78  Pulse: 72 70 72 72  Resp: 16 18 17  (!) 21  Temp:      SpO2: 99% 100% 100% 100%  Weight:      Height:      PainSc:        Isolation Precautions No active isolations  Medications Medications  pantoprazole (PROTONIX) injection 40 mg (has no administration in time range)  0.9 %  sodium chloride infusion (Manually program via Guardrails IV Fluids) (has no administration in time range)    Mobility walks Low fall risk   Focused Assessments Pulmonary Assessment Handoff:  Lung sounds:   O2 Device: Room Air        R Recommendations: See Admitting Provider Note  Report given to:   Additional Notes:

## 2019-05-17 NOTE — H&P (Signed)
History and Physical  Janet Williamson [id number] DOB: 12/23/1940 DOA: 05/17/2019  Referring physician: Dr Stevie Kernykstra, ED physician PCP: Maurice SmallGriffin, [name], MD  Outpatient Specialists: none  Patient Coming From: home  Chief Complaint: Dyspnea on exertion  HPI: Janet Williamson is a 78 y.o. female with a history of type 2 diabetes, GERD, hypertension, atrial fibrillation on Xarelto, anemia, history of GI bleed.  Patient was recently hospitalized for the same complaints of dyspnea and melena.  Patient was evaluated by GI for EGD without source of bleeding identified.  Her Xarelto was resumed.  She had no symptoms until yesterday when she began having melena and dyspnea on exertion.  When her symptoms continued today, she came to the hospital for evaluation.  No palliating or provoking factors.  Her dyspnea is not worsening.  Emergency Department Course: Hemoglobin 7.9.  EDP talked with GI, who recommended observation  Review of Systems:   Pt denies any fevers, chills, nausea, vomiting, diarrhea, constipation, abdominal pain, orthopnea, cough, wheezing, palpitations, headache, vision changes, lightheadedness, dizziness, rectal bleeding.  Review of systems are otherwise negative  Past Medical History:  Diagnosis Date  . Anemia   . Cardiac mass    a. 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 Not seen on most recent TEE 2019.  . Cardiomyopathy in other disease   . Cataracts, bilateral   . Diabetes mellitus    Type 2  . Generalized osteoarthritis   . GERD (gastroesophageal reflux disease)   . HLD (hyperlipidemia)   . Hypertension   . Hypothyroidism   . LBBB (left bundle branch block)   . PAF (paroxysmal atrial fibrillation) (HCC)    a. s/p DCCV 06/2017, on Xarelto  . Phlebitis   . S/P TAVR (transcatheter aortic valve replacement) 04/15/2018   Edwards Sapien 3 THV (size 23 mm, model # 9600TFX, serial # [id number]) via the TF approach  . Severe aortic stenosis    a. s/p TAVR 04/2018.  .  Stroke [facility](HCC) 2008   Past Surgical History:  Procedure Laterality Date  . ABDOMINAL HYSTERECTOMY    . COLONOSCOPY    . COLONOSCOPY WITH PROPOFOL N/A 10/02/2018   Procedure: COLONOSCOPY WITH PROPOFOL;  Surgeon: Willis Modenautlaw, William, MD;  [facility];  Service: Endoscopy;  Laterality: N/A;  . ESOPHAGOGASTRODUODENOSCOPY (EGD) WITH PROPOFOL N/A 10/02/2018   Procedure: ESOPHAGOGASTRODUODENOSCOPY (EGD) WITH PROPOFOL;  Surgeon: Willis Modenautlaw, William, MD;  [facility];  Service: Endoscopy;  Laterality: N/A;  . ESOPHAGOGASTRODUODENOSCOPY (EGD) WITH PROPOFOL N/A 04/23/2019   Procedure: ESOPHAGOGASTRODUODENOSCOPY (EGD) WITH PROPOFOL;  Surgeon: Graylin ShiverGanem, Salem F, MD;  [facility];  Service: Endoscopy;  Laterality: N/A;  . EYE SURGERY Bilateral    cataract removal  . GIVENS CAPSULE STUDY N/A 10/02/2018   Procedure: GIVENS CAPSULE STUDY;  Surgeon: Willis Modenautlaw, William, MD;  [facility];  Service: Endoscopy;  Laterality: N/A;  . RIGHT/LEFT HEART CATH AND CORONARY ANGIOGRAPHY N/A 03/06/2018   Procedure: RIGHT/LEFT HEART CATH AND CORONARY ANGIOGRAPHY;  Surgeon: Lyn RecordsSmith, Henry W, MD;  [facility];  Service: Cardiovascular;  Laterality: N/A;  . TEE WITHOUT CARDIOVERSION N/A 04/01/2018   Procedure: TRANSESOPHAGEAL ECHOCARDIOGRAM (TEE);  Surgeon: Jake BatheSkains, Mark C, MD;  [facility];  Service: Cardiovascular;  Laterality: N/A;  . TEE WITHOUT CARDIOVERSION N/A 04/15/2018   Procedure: TRANSESOPHAGEAL ECHOCARDIOGRAM (TEE);  Surgeon: Tonny Bollmanooper, Michael, MD;  [facility];  Service: Open Heart Surgery;  Laterality: N/A;  . TOTAL KNEE ARTHROPLASTY Right 04/14/2013   Dr August Saucerean  . TOTAL KNEE ARTHROPLASTY Right 04/14/2013  Procedure: TOTAL KNEE ARTHROPLASTY;  Surgeon: Meredith Pel, MD;  Location: [place];  Service: Orthopedics;  Laterality: Right;  . TRANSCATHETER AORTIC VALVE REPLACEMENT, TRANSFEMORAL  04/15/2018  . TRANSCATHETER AORTIC VALVE REPLACEMENT, TRANSFEMORAL  Bilateral 04/15/2018   Procedure: TRANSCATHETER AORTIC VALVE REPLACEMENT, TRANSFEMORAL;  Surgeon: Sherren Mocha, MD;  Location: [place];  Service: Open Heart Surgery;  Laterality: Bilateral;   Social History:  reports that she has never smoked. She has never used smokeless tobacco. She reports that she does not drink alcohol or use drugs. Patient lives at home  Allergies  Allergen Reactions  . Ace Inhibitors Swelling and Other (See Comments)    Angioedema   . Keflex [Cephalexin] Swelling and Other (See Comments)    Lips became swollwn  . Rifadin [Rifampin] Swelling and Other (See Comments)    Lips became swollen    Family History  Problem Relation Age of Onset  . Stroke Mother   . Hypertension Mother   . Hypertension Father   . Heart attack Neg Hx       Prior to Admission medications   Medication Sig Start Date End Date Taking? Authorizing Provider  acetaminophen (TYLENOL) 325 MG tablet Take 325-650 mg by mouth every 8 (eight) hours as needed (for headaches).    Yes [provider]  amLODipine (NORVASC) 5 MG tablet Take 5 mg by mouth daily.   Yes [provider]  Cholecalciferol (VITAMIN D3) 5000 units CAPS Take 5,000 Units by mouth daily.   Yes [provider]  ferrous sulfate 325 (65 FE) MG tablet Take 1 tablet (325 mg total) by mouth daily. 04/24/19 07/23/19 Yes Dessa Phi, DO  hydrochlorothiazide (HYDRODIURIL) 25 MG tablet Take 25 mg by mouth daily.    Yes [provider]  metFORMIN (GLUCOPHAGE-XR) 750 MG 24 hr tablet TAKE 2 TABLETS BY MOUTH ONCE DAILY WITH BREAKFAST Patient taking differently: Take 1,500 mg by mouth daily with breakfast.  05/04/19  Yes Elayne Snare, MD  metoprolol succinate (TOPROL-XL) 50 MG 24 hr tablet Take 50 mg by mouth daily.    Yes [provider]  pantoprazole (PROTONIX) 40 MG tablet Take 1 tablet (40 mg total) by mouth 2 (two) times daily. 04/24/19  Yes Dessa Phi, DO  Polyethyl Glycol-Propyl Glycol  (SYSTANE OP) Place 1 drop into both eyes 2 (two) times daily.    Yes [provider]  simvastatin (ZOCOR) 20 MG tablet Take 20 mg by mouth every evening.   Yes [provider]  XARELTO 20 MG TABS tablet TAKE 1 TABLET BY MOUTH ONCE DAILY WITH SUPPER Patient taking differently: Take 20 mg by mouth daily with supper. High risk med: Anticoagulant.  Crushed Xarelto can be given down a G-tube but NOT a J-Tube. 05/15/19  Yes Jerline Pain, MD  glucose blood (ACCU-CHEK AVIVA) test strip Use as instructed to check once daily DX: E11.9 07/02/18   Elayne Snare, MD    Physical Exam: BP 124/78   Pulse 72   Temp 97.8 F (36.6 C)   Resp (!) 21   Ht 5\' 4"  (1.626 m)   Wt 81.6 kg   SpO2 100%   BMI 30.90 kg/m   . General: Elderly black female. Awake and alert and oriented x3. No acute cardiopulmonary distress.  Marland Kitchen HEENT: Normocephalic atraumatic.  Right and left ears normal in appearance.  Pupils equal, round, reactive to light. Extraocular muscles are intact. Sclerae anicteric and noninjected.  Moist mucosal membranes. No mucosal lesions.  . Neck: Neck supple  without lymphadenopathy. No carotid bruits. No masses palpated.  . Cardiovascular: Regular rate with normal S1-S2 sounds. No murmurs, rubs, gallops auscultated. No JVD.  Marland Kitchen. Respiratory: Good respiratory effort with no wheezes, rales, rhonchi. Lungs clear to auscultation bilaterally.  No accessory muscle use. . Abdomen: Soft, nontender, nondistended. Active bowel sounds. No masses or hepatosplenomegaly  . Skin: No rashes, lesions, or ulcerations.  Dry, warm to touch. 2+ dorsalis pedis and radial pulses. . Musculoskeletal: No calf or leg pain. All major joints not erythematous nontender.  No upper or lower joint deformation.  Good ROM.  No contractures  . Psychiatric: Intact judgment and insight. Pleasant and cooperative. . Neurologic: No focal neurological deficits. Strength is 5/5 and symmetric in upper and lower extremities.  Cranial  nerves II through XII are grossly intact.           Labs on Admission: I have personally reviewed following labs and imaging studies  CBC: Recent Labs  Lab 05/17/19 1341  WBC 9.3  HGB 7.9*  HCT 27.0*  MCV 80.4  PLT 422*   Basic Metabolic Panel: Recent Labs  Lab 05/17/19 1341  NA 140  K 3.3*  CL 103  CO2 26  GLUCOSE 132*  BUN 19  CREATININE 0.80  CALCIUM 9.8   GFR: Estimated Creatinine Clearance: 59.9 mL/min (by C-G formula based on SCr of 0.8 mg/dL). Liver Function Tests: Recent Labs  Lab 05/17/19 1341  AST 16  ALT 10  ALKPHOS 49  BILITOT 0.3  PROT 7.5  ALBUMIN 3.3*   No results for input(s): LIPASE, AMYLASE in the last 168 hours. No results for input(s): AMMONIA in the last 168 hours. Coagulation Profile: No results for input(s): INR, PROTIME in the last 168 hours. Cardiac Enzymes: No results for input(s): CKTOTAL, CKMB, CKMBINDEX, TROPONINI in the last 168 hours. BNP (last 3 results) No results for input(s): PROBNP in the last 8760 hours. HbA1C: No results for input(s): HGBA1C in the last 72 hours. CBG: No results for input(s): GLUCAP in the last 168 hours. Lipid Profile: No results for input(s): CHOL, HDL, LDLCALC, TRIG, CHOLHDL, LDLDIRECT in the last 72 hours. Thyroid Function Tests: No results for input(s): TSH, T4TOTAL, FREET4, T3FREE, THYROIDAB in the last 72 hours. Anemia Panel: No results for input(s): VITAMINB12, FOLATE, FERRITIN, TIBC, IRON, RETICCTPCT in the last 72 hours. Urine analysis:    Component Value Date/Time   COLORURINE YELLOW 10/30/2018 0936   APPEARANCEUR Sl Cloudy (A) 10/30/2018 0936   LABSPEC 1.020 10/30/2018 0936   PHURINE 5.5 10/30/2018 0936   GLUCOSEU NEGATIVE 10/30/2018 0936   HGBUR NEGATIVE 10/30/2018 0936   BILIRUBINUR NEGATIVE 10/30/2018 0936   KETONESUR NEGATIVE 10/30/2018 0936   PROTEINUR NEGATIVE 09/30/2018 0013   UROBILINOGEN 0.2 10/30/2018 0936   NITRITE NEGATIVE 10/30/2018 0936   LEUKOCYTESUR NEGATIVE  10/30/2018 0936   Sepsis Labs: @LABRCNTIP (procalcitonin:4,lacticidven:4) )No results found for this or any previous visit (from the past 240 hour(s)).   Radiological Exams on Admission: Dg Chest Portable 1 View  Result Date: 05/17/2019 CLINICAL DATA:  78 year old female with history of shortness of breath. EXAM: PORTABLE CHEST 1 VIEW COMPARISON:  Chest x-ray 04/21/2019. FINDINGS: Lung volumes are normal. No consolidative airspace disease. No pleural effusions. No pneumothorax. No pulmonary nodule or mass noted. Pulmonary vasculature and the cardiomediastinal silhouette are within normal limits. Atherosclerosis in the thoracic aorta. Sapien type transcatheter aortic valve incidentally noted. IMPRESSION: 1.  No radiographic evidence of acute cardiopulmonary disease. 2. Aortic atherosclerosis. 3. Status post TAVR. Electronically Signed  By: Trudie Reed M.D.   On: 05/17/2019 15:27    EKG: Independently reviewed.  Sinus rhythm with left bundle branch block.  Assessment/Plan: Principal Problem:   Symptomatic anemia Active Problems:   DM (diabetes mellitus) (HCC)   HTN (hypertension)   PAF (paroxysmal atrial fibrillation) (HCC)   GI bleed    This patient was discussed with the ED physician, including pertinent vitals, physical exam findings, labs, and imaging.  We also discussed care given by the ED provider.  1. Symptomatic anemia a. We will transfuse 1 unit.  Repeat CBC this evening and tomorrow morning b. Discussed with the patient risks of transfusion including transfusion reaction and infection risk. 2. GI bleed a. Hold Xarelto b. Protonix IV c. GI consult 3. PAF a. Currently in sinus rhythm b. Hold Xarelto for now 4. Hypertension a. Continue antihypertensives 5. Diabetes a. Continue metformin b. Sliding scale insulin  DVT prophylaxis: SCDs Consultants: GI Code Status: Full code Family Communication: None Disposition Plan: Home following evaluation   Levie Heritage, DO

## 2019-05-17 NOTE — ED Provider Notes (Signed)
[facility] EMERGENCY DEPARTMENT Provider Note   CSN: [id number] Arrival date & time: 05/17/19  1325     History   Chief Complaint Chief Complaint  Patient presents with  . Shortness of Breath    HPI Janet Williamson is a 77 y.o. female.  PMH stroke, history of TAVR,atrial fibrillation on anticoagulation,diabetes mellitus, hypertension, hypothyroidism, hyperlipidemia.  Presents emergency department with complaints of feeling short of breath and having dark stools.  Patient states yesterday she noted dark, almost black appearing stools.  Denies seeing any bright red blood.  Today she has felt more short of breath than normal.  States feels similar to prior episodes of GI bleeding.  Has had no associated chest pain, cough, fevers.  No associated abdominal pain vomiting or diarrhea.     HPI  Past Medical History:  Diagnosis Date  . Anemia   . Cardiac mass    a. 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 Not seen on most recent TEE 2019.  . Cardiomyopathy in other disease   . Cataracts, bilateral   . Diabetes mellitus    Type 2  . Generalized osteoarthritis   . GERD (gastroesophageal reflux disease)   . HLD (hyperlipidemia)   . Hypertension   . Hypothyroidism   . LBBB (left bundle branch block)   . PAF (paroxysmal atrial fibrillation) (HCC)    a. s/p DCCV 06/2017, on Xarelto  . Phlebitis   . S/P TAVR (transcatheter aortic valve replacement) 04/15/2018   Edwards Sapien 3 THV (size 23 mm, model # 9600TFX, serial # [id number]) via the TF approach  . Severe aortic stenosis    a. s/p TAVR 04/2018.  . Stroke [facility]) 2008    Patient Active Problem List   Diagnosis Date Noted  . GI bleed 04/21/2019  . Symptomatic anemia 09/30/2018  . S/P TAVR (transcatheter aortic valve replacement) 04/15/2018  . Severe aortic stenosis   . PAF (paroxysmal atrial fibrillation) (HCC) 03/04/2018  . History of stroke 11/03/2014  . Hypertension   . DM (diabetes mellitus) (HCC)  05/06/2013  . HTN (hypertension) 05/06/2013  . Anemia 05/06/2013    Past Surgical History:  Procedure Laterality Date  . ABDOMINAL HYSTERECTOMY    . COLONOSCOPY    . COLONOSCOPY WITH PROPOFOL N/A 10/02/2018   Procedure: COLONOSCOPY WITH PROPOFOL;  Surgeon: [name], MD;  Location: [facility] ENDOSCOPY;  Service: Endoscopy;  Laterality: N/A;  . ESOPHAGOGASTRODUODENOSCOPY (EGD) WITH PROPOFOL N/A 10/02/2018   Procedure: ESOPHAGOGASTRODUODENOSCOPY (EGD) WITH PROPOFOL;  Surgeon: [name], MD;  Location: [facility] ENDOSCOPY;  Service: Endoscopy;  Laterality: N/A;  . ESOPHAGOGASTRODUODENOSCOPY (EGD) WITH PROPOFOL N/A 04/23/2019   Procedure: ESOPHAGOGASTRODUODENOSCOPY (EGD) WITH PROPOFOL;  Surgeon: Graylin Shiver, MD;  Location: [facility] ENDOSCOPY;  Service: Endoscopy;  Laterality: N/A;  . EYE SURGERY Bilateral    cataract removal  . GIVENS CAPSULE STUDY N/A 10/02/2018   Procedure: GIVENS CAPSULE STUDY;  Surgeon: [name], MD;  Location: [facility] ENDOSCOPY;  Service: Endoscopy;  Laterality: N/A;  . RIGHT/LEFT HEART CATH AND CORONARY ANGIOGRAPHY N/A 03/06/2018   Procedure: RIGHT/LEFT HEART CATH AND CORONARY ANGIOGRAPHY;  Surgeon: Lyn Records, MD;  Location: [facility] INVASIVE CV LAB;  Service: Cardiovascular;  Laterality: N/A;  . TEE WITHOUT CARDIOVERSION N/A 04/01/2018   Procedure: TRANSESOPHAGEAL ECHOCARDIOGRAM (TEE);  Surgeon: Jake Bathe, MD;  Location: [facility] ENDOSCOPY;  Service: Cardiovascular;  Laterality: N/A;  . TEE WITHOUT CARDIOVERSION N/A 04/15/2018   Procedure: TRANSESOPHAGEAL ECHOCARDIOGRAM (TEE);  Surgeon: Tonny Bollman, MD;  Location: [facility] OR;  Service:  Open Heart Surgery;  Laterality: N/A;  . TOTAL KNEE ARTHROPLASTY Right 04/14/2013   Dr Marlou Sa  . TOTAL KNEE ARTHROPLASTY Right 04/14/2013   Procedure: TOTAL KNEE ARTHROPLASTY;  Surgeon: Meredith Pel, MD;  Location: [place];  Service: Orthopedics;  Laterality: Right;  . TRANSCATHETER AORTIC VALVE REPLACEMENT, TRANSFEMORAL  04/15/2018  .  TRANSCATHETER AORTIC VALVE REPLACEMENT, TRANSFEMORAL Bilateral 04/15/2018   Procedure: TRANSCATHETER AORTIC VALVE REPLACEMENT, TRANSFEMORAL;  Surgeon: Sherren Mocha, MD;  Location: No Name;  Service: Open Heart Surgery;  Laterality: Bilateral;     OB History   No obstetric history on file.      Home Medications    Prior to Admission medications   Medication Sig Start Date End Date Taking? Authorizing Provider  acetaminophen (TYLENOL) 325 MG tablet Take 325-650 mg by mouth every 8 (eight) hours as needed (for headaches).     [provider]  amLODipine (NORVASC) 5 MG tablet Take 5 mg by mouth daily.    [provider]  calcium carbonate (TUMS - DOSED IN MG ELEMENTAL CALCIUM) 500 MG chewable tablet Chew 1-2 tablets by mouth as needed for indigestion or heartburn.    [provider]  Cholecalciferol (VITAMIN D3) 5000 units CAPS Take 5,000 Units by mouth daily.    [provider]  ferrous sulfate 325 (65 FE) MG tablet Take 1 tablet (325 mg total) by mouth daily. 04/24/19 07/23/19  Dessa Phi, DO  glucose blood (ACCU-CHEK AVIVA) test strip Use as instructed to check once daily DX: E11.9 07/02/18   Elayne Snare, MD  hydrochlorothiazide (HYDRODIURIL) 25 MG tablet Take 25 mg by mouth daily.     [provider]  metFORMIN (GLUCOPHAGE-XR) 750 MG 24 hr tablet TAKE 2 TABLETS BY MOUTH ONCE DAILY WITH BREAKFAST 05/04/19   Elayne Snare, MD  metoprolol succinate (TOPROL-XL) 50 MG 24 hr tablet Take 50 mg by mouth daily.     [provider]  pantoprazole (PROTONIX) 40 MG tablet Take 1 tablet (40 mg total) by mouth 2 (two) times daily. 04/24/19   Dessa Phi, DO  Polyethyl Glycol-Propyl Glycol (SYSTANE OP) Place 1 drop into both eyes 2 (two) times daily.     [provider]  simvastatin (ZOCOR) 20 MG tablet Take 20 mg by mouth every evening.    [provider]  XARELTO 20 MG TABS tablet TAKE 1 TABLET BY MOUTH ONCE DAILY WITH SUPPER 05/15/19    Jerline Pain, MD    Family History Family History  Problem Relation Age of Onset  . Stroke Mother   . Hypertension Mother   . Hypertension Father   . Heart attack Neg Hx     Social History Social History   Tobacco Use  . Smoking status: Never Smoker  . Smokeless tobacco: Never Used  Substance Use Topics  . Alcohol use: No  . Drug use: No     Allergies   Ace inhibitors, Keflex [cephalexin], and Rifadin [rifampin]   Review of Systems Review of Systems  Constitutional: Negative for chills and fever.  HENT: Negative for ear pain and sore throat.   Eyes: Negative for pain and visual disturbance.  Respiratory: Positive for shortness of breath. Negative for cough.   Cardiovascular: Negative for chest pain and palpitations.  Gastrointestinal: Positive for blood in stool. Negative for abdominal pain and vomiting.  Genitourinary: Negative for dysuria and hematuria.  Musculoskeletal: Negative for arthralgias and back pain.  Skin: Negative for color change and rash.  Neurological: Negative for seizures and  syncope.  All other systems reviewed and are negative.    Physical Exam Updated Vital Signs BP 123/76   Pulse 89   Temp 97.8 F (36.6 C)   Resp 19   SpO2 99%   Physical Exam Vitals signs and nursing note reviewed.  Constitutional:      General: She is not in acute distress.    Appearance: She is well-developed.  HENT:     Head: Normocephalic and atraumatic.  Eyes:     Conjunctiva/sclera: Conjunctivae normal.  Neck:     Musculoskeletal: Neck supple.  Cardiovascular:     Rate and Rhythm: Normal rate and regular rhythm.     Heart sounds: No murmur.  Pulmonary:     Effort: Pulmonary effort is normal. No respiratory distress.     Breath sounds: Normal breath sounds.  Abdominal:     Palpations: Abdomen is soft.     Tenderness: There is no abdominal tenderness.  Genitourinary:    Comments: No hemorrhoids, fissures  Black appearing stool Skin:     General: Skin is warm and dry.  Neurological:     Mental Status: She is alert.      ED Treatments / Results  Labs (all labs ordered are listed, but only abnormal results are displayed) Labs Reviewed  COMPREHENSIVE METABOLIC PANEL - Abnormal; Notable for the following components:      Result Value   Potassium 3.3 (*)    Glucose, Bld 132 (*)    Albumin 3.3 (*)    All other components within normal limits  CBC - Abnormal; Notable for the following components:   RBC 3.36 (*)    Hemoglobin 7.9 (*)    HCT 27.0 (*)    MCH 23.5 (*)    MCHC 29.3 (*)    RDW 22.4 (*)    Platelets 422 (*)    All other components within normal limits  POC OCCULT BLOOD, ED  TYPE AND SCREEN    EKG None  Radiology No results found.  Procedures Procedures (including critical care time)  Medications Ordered in ED Medications - No data to display   Initial Impression / Assessment and Plan / ED Course  I have reviewed the triage vital signs and the nursing notes.  Pertinent labs & imaging results that were available during my care of the patient were reviewed by me and considered in my medical decision making (see chart for details).  Clinical Course as of May 16 2008  Wynelle LinkSun May 17, 2019  1744 Discussed with hospitalist - requests consult GI to discuss case prior to accepting   [RD]  1754 Discussed with [facility], Dr. Evette CristalGanem recommends clear liquid diet, hold Xarelto and monitor overnight   [RD]    Clinical Course User Index [RD] Milagros Lollykstra, Conya Ellinwood S, MD       78 year old gentleman past medical history A. fib on Xarelto, TAVR, prior GI bleed presents emergency department with shortness of breath, dark stools.  On exam noted black appearing stool.  Patient otherwise well-appearing soft abdomen and stable vital signs.  Hemoglobin 7.9, similar to baseline per chart review.  Discussed case with GI who recommends monitoring overnight and holding Xarelto for now.  Admitted to hospitalist service.  Dr.  Adrian BlackwaterStinson accepted.  Final Clinical Impressions(s) / ED Diagnoses   Final diagnoses:  Gastrointestinal hemorrhage, unspecified gastrointestinal hemorrhage type  Symptomatic anemia    ED Discharge Orders    None       Milagros Lollykstra, Keylon Labelle S, MD 05/17/19 2010

## 2019-05-18 DIAGNOSIS — K922 Gastrointestinal hemorrhage, unspecified: Secondary | ICD-10-CM | POA: Diagnosis not present

## 2019-05-18 DIAGNOSIS — D649 Anemia, unspecified: Secondary | ICD-10-CM | POA: Diagnosis not present

## 2019-05-18 LAB — BASIC METABOLIC PANEL
Anion gap: 9 (ref 5–15)
BUN: 13 mg/dL (ref 8–23)
CO2: 28 mmol/L (ref 22–32)
Calcium: 9.2 mg/dL (ref 8.9–10.3)
Chloride: 101 mmol/L (ref 98–111)
Creatinine, Ser: 0.65 mg/dL (ref 0.44–1.00)
GFR calc Af Amer: >60 mL/min (ref >60)
GFR calc non Af Amer: >60 mL/min (ref >60)
Glucose, Bld: 97 mg/dL (ref 70–99)
Potassium: 3.6 mmol/L (ref 3.5–5.1)
Sodium: 138 mmol/L (ref 135–145)

## 2019-05-18 LAB — CBC
HCT: 27.4 % — ABNORMAL LOW (ref 36.0–46.0)
Hemoglobin: 8.2 g/dL — ABNORMAL LOW (ref 12.0–15.0)
MCH: 24.4 pg — ABNORMAL LOW (ref 26.0–34.0)
MCHC: 29.9 g/dL — ABNORMAL LOW (ref 30.0–36.0)
MCV: 81.5 fL (ref 80.0–100.0)
Platelets: 354 10*3/uL (ref 150–400)
RBC: 3.36 MIL/uL — ABNORMAL LOW (ref 3.87–5.11)
RDW: 21 % — ABNORMAL HIGH (ref 11.5–15.5)
WBC: 9 10*3/uL (ref 4.0–10.5)
nRBC: 0 % (ref 0.0–0.2)

## 2019-05-18 LAB — GLUCOSE, CAPILLARY
Glucose-Capillary: 90 mg/dL (ref 70–99)
Glucose-Capillary: 90 mg/dL (ref 70–99)
Glucose-Capillary: 98 mg/dL (ref 70–99)
Glucose-Capillary: 94 mg/dL (ref 70–99)

## 2019-05-18 LAB — HEMOGLOBIN AND HEMATOCRIT, BLOOD
HCT: 28.1 % — ABNORMAL LOW (ref 36.0–46.0)
Hemoglobin: 8.5 g/dL — ABNORMAL LOW (ref 12.0–15.0)

## 2019-05-18 LAB — SARS CORONAVIRUS 2 (TAT 6-24 HRS): SARS Coronavirus 2: NEGATIVE

## 2019-05-18 NOTE — Progress Notes (Signed)
PROGRESS NOTE    Janet HeadingRosita P Pakula  WUJ:[id number] DOB: 08/11/1941 DOA: 05/17/2019 PCP: Maurice SmallGriffin, [name], MD   Brief Narrative: Janet Williamson is a 78 y.o. female with a history of type 2 diabetes, GERD, hypertension, atrial fibrillation on Xarelto, anemia, history of GI bleed.  Patient was recently hospitalized from 04/21/2019 04/24/2019 for the same complaints of dyspnea and melena.  Patient was evaluated by GI for EGD without source of bleeding identified.  She was discharged home and Her Xarelto was resumed.  She had no symptoms until 05/16/2019 when she began having melena and dyspnea on exertion.  When her symptoms continued she came to the emergency department on 05/17/2019.  She was admitted under hospitalist service.  Due to symptomatic anemia despite of having hemoglobin of 7.9 which eventually dropped to 7.3, she received 1 blood unit transfusion and her repeat hemoglobin today is 8.2.   Assessment & Plan:   Principal Problem:   Symptomatic anemia Active Problems:   DM (diabetes mellitus) (HCC)   HTN (hypertension)   PAF (paroxysmal atrial fibrillation) (HCC)   GI bleed   Symptomatic anemia/acute on chronic upper GI bleed/acute blood loss anemia: Presented with hemoglobin of 7.9 which dropped to 7.3.  She received 1 unit of PRBC transfusion on 05/17/2019.  Current hemoglobin 8.2.  She remains on clear liquid diet.  Her Xarelto is on hold.  According to admitting hospitalist summary, GI was consulted.  When she was not seen by GI until 4 PM, I spoke to Dr. Ewing SchleinMagod who mentioned that he was not officially consulted however he provided his recommendations to keep the patient overnight for observation on clears.  Today, he recommended once again to keep patient on clears now with n.p.o. overnight and that 1 of his colleagues from GI will see patient tomorrow and further decide on the course of evaluation.  I will repeat her hemoglobin now and tomorrow again.  Paroxysmal atrial fibrillation: She  is in sinus rhythm.  Her chadsVasc2 score is 5 which makes her at least moderate risk if not high risk for stroke.  Holding Xarelto for active GI bleed now.  Monitor on telemetry.  Hypertension: Controlled.  Continue current medications.  Type 2 diabetes mellitus: On metformin at home which is on hold.  Blood sugar control.  Continue SSI.  DVT prophylaxis: SCD Code Status: Full code Family Communication: None present Disposition Plan: Hopefully discharge tomorrow if no further work-up by GI.  Objective: Vitals:   05/18/19 0535 05/18/19 0917 05/18/19 0920 05/18/19 1308  BP: (!) 121/54 119/61  124/62  Pulse: 69  75 75  Resp: 18   16  Temp: 98.1 F (36.7 C)   97.9 F (36.6 C)  TempSrc: Oral   Oral  SpO2: 100%   100%  Weight:      Height:        Intake/Output Summary (Last 24 hours) at 05/18/2019 1616 Last data filed at 05/18/2019 0900 Gross per 24 hour  Intake 1095 ml  Output -  Net 1095 ml   Filed Weights   05/17/19 1453  Weight: 81.6 kg    Consultants:   GI  Procedures:   None  Antimicrobials:   None   Subjective: Patient seen and examined earlier.  She stated that she feels better and stronger with no shortness of breath or any abdominal pain.  Has not had any bowel movement since admission.  Objective: Vitals:   05/18/19 0535 05/18/19 0917 05/18/19 0920 05/18/19 1308  BP: (!) 121/54  119/61  124/62  Pulse: 69  75 75  Resp: 18   16  Temp: 98.1 F (36.7 C)   97.9 F (36.6 C)  TempSrc: Oral   Oral  SpO2: 100%   100%  Weight:      Height:        Intake/Output Summary (Last 24 hours) at 05/18/2019 1616 Last data filed at 05/18/2019 0900 Gross per 24 hour  Intake 1095 ml  Output -  Net 1095 ml   Filed Weights   05/17/19 1453  Weight: 81.6 kg    Examination:  General exam: Appears calm and comfortable  Respiratory system: Clear to auscultation. Respiratory effort normal. Cardiovascular system: S1 & S2 heard, RRR. No JVD, murmurs, rubs, gallops  or clicks. No pedal edema. Gastrointestinal system: Abdomen is nondistended, soft and nontender. No organomegaly or masses felt. Normal bowel sounds heard. Central nervous system: Alert and oriented. No focal neurological deficits. Extremities: Symmetric 5 x 5 power. Skin: No rashes, lesions or ulcers Psychiatry: Judgement and insight appear normal. Mood & affect appropriate.    Data Reviewed: I have personally reviewed following labs and imaging studies  CBC: Recent Labs  Lab 05/17/19 1341 05/17/19 2159 05/18/19 0544  WBC 9.3 10.3 9.0  HGB 7.9* 7.3* 8.2*  HCT 27.0* 24.5* 27.4*  MCV 80.4 79.0* 81.5  PLT 422* 358 935   Basic Metabolic Panel: Recent Labs  Lab 05/17/19 1341 05/18/19 0544  NA 140 138  K 3.3* 3.6  CL 103 101  CO2 26 28  GLUCOSE 132* 97  BUN 19 13  CREATININE 0.80 0.65  CALCIUM 9.8 9.2   GFR: Estimated Creatinine Clearance: 59.9 mL/min (by C-G formula based on SCr of 0.65 mg/dL). Liver Function Tests: Recent Labs  Lab 05/17/19 1341  AST 16  ALT 10  ALKPHOS 49  BILITOT 0.3  PROT 7.5  ALBUMIN 3.3*   No results for input(s): LIPASE, AMYLASE in the last 168 hours. No results for input(s): AMMONIA in the last 168 hours. Coagulation Profile: No results for input(s): INR, PROTIME in the last 168 hours. Cardiac Enzymes: No results for input(s): CKTOTAL, CKMB, CKMBINDEX, TROPONINI in the last 168 hours. BNP (last 3 results) No results for input(s): PROBNP in the last 8760 hours. HbA1C: Recent Labs    05/17/19 2159  HGBA1C 4.7*   CBG: Recent Labs  Lab 05/18/19 0749 05/18/19 1158 05/18/19 1610  GLUCAP 90 90 98   Lipid Profile: No results for input(s): CHOL, HDL, LDLCALC, TRIG, CHOLHDL, LDLDIRECT in the last 72 hours. Thyroid Function Tests: No results for input(s): TSH, T4TOTAL, FREET4, T3FREE, THYROIDAB in the last 72 hours. Anemia Panel: No results for input(s): VITAMINB12, FOLATE, FERRITIN, TIBC, IRON, RETICCTPCT in the last 72 hours.  Sepsis Labs: No results for input(s): PROCALCITON, LATICACIDVEN in the last 168 hours.  Recent Results (from the past 240 hour(s))  SARS CORONAVIRUS 2 Nasal Swab Aptima Multi Swab     Status: None   Collection Time: 05/17/19  6:57 PM   Specimen: Aptima Multi Swab; Nasal Swab  Result Value Ref Range Status   SARS Coronavirus 2 NEGATIVE NEGATIVE Final    Comment: (NOTE) SARS-CoV-2 target nucleic acids are NOT DETECTED. The SARS-CoV-2 RNA is generally detectable in upper and lower respiratory specimens during the acute phase of infection. Negative results do not preclude SARS-CoV-2 infection, do not rule out co-infections with other pathogens, and should not be used as the sole basis for treatment or other patient management decisions. Negative results must  be combined with clinical observations, patient history, and epidemiological information. The expected result is Negative. Fact Sheet for Patients: HairSlick.no Fact Sheet for Healthcare Providers: quierodirigir.com This test is not yet approved or cleared by the [place] FDA and  has been authorized for detection and/or diagnosis of SARS-CoV-2 by FDA under an Emergency Use Authorization (EUA). This EUA will remain  in effect (meaning this test can be used) for the duration of the COVID-19 declaration under Section 56 4(b)(1) of the Act, 21 U.S.C. section 360bbb-3(b)(1), unless the authorization is terminated or revoked sooner. Performed at [facility], [address] [place], [place] [address]       Radiology Studies: Dg Chest Portable 1 View  Result Date: 05/17/2019 CLINICAL DATA:  78 year old female with history of shortness of breath. EXAM: PORTABLE CHEST 1 VIEW COMPARISON:  Chest x-ray 04/21/2019. FINDINGS: Lung volumes are normal. No consolidative airspace disease. No pleural effusions. No pneumothorax. No pulmonary nodule or mass noted. Pulmonary  vasculature and the cardiomediastinal silhouette are within normal limits. Atherosclerosis in the thoracic aorta. Sapien type transcatheter aortic valve incidentally noted. IMPRESSION: 1.  No radiographic evidence of acute cardiopulmonary disease. 2. Aortic atherosclerosis. 3. Status post TAVR. Electronically Signed   By: Trudie Reed M.D.   On: 05/17/2019 15:27    Scheduled Meds: . sodium chloride   Intravenous Once  . amLODipine  5 mg Oral Daily  . ferrous sulfate  325 mg Oral TID WC  . hydrochlorothiazide  25 mg Oral Daily  . insulin aspart  0-15 Units Subcutaneous TID WC  . insulin aspart  0-5 Units Subcutaneous QHS  . metFORMIN  1,500 mg Oral Q breakfast  . metoprolol succinate  50 mg Oral Daily  . pantoprazole (PROTONIX) IV  40 mg Intravenous Q12H  . simvastatin  20 mg Oral QPM   Continuous Infusions:   LOS: 0 days   Time spent: 32 minutes   Hughie Closs, MD [facility] Pager [phone]  If 7PM-7AM, please contact night-coverage [email or web address] Password [email or web address] 05/18/2019, 4:16 PM

## 2019-05-18 NOTE — Progress Notes (Signed)
Janet Williamson is a 78 y.o. female patient admitted from ED awake, alert - oriented  X 4 - no acute distress noted.  VSS - Blood pressure (!) 121/54, pulse 69, temperature 98.1 F (36.7 C), temperature source Oral, resp. rate 18, height 5\' 4"  (1.626 m), weight 81.6 kg, SpO2 100 %.    IV in place, occlusive dsg intact without redness.  Orientation to room, and floor completed with information packet given to patient/family.  Patient declined safety video at this time.  Admission INP armband ID verified with patient/family, and in place.    SR up x 2, fall assessment complete, with patient and family able to verbalize understanding of risk associated with falls, and verbalized understanding to call nsg before up out of bed.    Call light within reach, patient able to voice, and demonstrate understanding. Skin to skin check completed with second RN. Skin, clean-dry- intact without evidence of bruising, or skin tears.   No evidence of skin break down noted on exam.     Will cont to eval and treat per MD orders.  Howard Pouch, RN 05/18/2019 8:48 AM

## 2019-05-19 ENCOUNTER — Observation Stay (HOSPITAL_COMMUNITY): Payer: Medicare Other | Admitting: Anesthesiology

## 2019-05-19 ENCOUNTER — Encounter (HOSPITAL_COMMUNITY)
Admission: EM | Disposition: A | Discharge status: Home / Self Care | Payer: Self-pay | Source: Home / Self Care | Attending: Emergency Medicine

## 2019-05-19 ENCOUNTER — Encounter (HOSPITAL_COMMUNITY): Payer: Self-pay | Admitting: Certified Registered Nurse Anesthetist

## 2019-05-19 DIAGNOSIS — D649 Anemia, unspecified: Secondary | ICD-10-CM | POA: Diagnosis not present

## 2019-05-19 DIAGNOSIS — Z20828 Contact with and (suspected) exposure to other viral communicable diseases: Secondary | ICD-10-CM | POA: Diagnosis not present

## 2019-05-19 DIAGNOSIS — K922 Gastrointestinal hemorrhage, unspecified: Secondary | ICD-10-CM | POA: Diagnosis not present

## 2019-05-19 DIAGNOSIS — E119 Type 2 diabetes mellitus without complications: Secondary | ICD-10-CM | POA: Diagnosis not present

## 2019-05-19 DIAGNOSIS — D62 Acute posthemorrhagic anemia: Secondary | ICD-10-CM | POA: Diagnosis not present

## 2019-05-19 HISTORY — PX: ENTEROSCOPY: SHX5533

## 2019-05-19 HISTORY — PX: SMALL BOWEL ENTEROSCOPY: SHX2415

## 2019-05-19 HISTORY — PX: BIOPSY: SHX5522

## 2019-05-19 LAB — CBC WITH DIFFERENTIAL/PLATELET
Abs Immature Granulocytes: 0.02 10*3/uL (ref 0.00–0.07)
Basophils Absolute: 0 10*3/uL (ref 0.0–0.1)
Basophils Relative: 0 %
Eosinophils Absolute: 0.1 10*3/uL (ref 0.0–0.5)
Eosinophils Relative: 1 %
HCT: 28.4 % — ABNORMAL LOW (ref 36.0–46.0)
Hemoglobin: 8.5 g/dL — ABNORMAL LOW (ref 12.0–15.0)
Immature Granulocytes: 0 %
Lymphocytes Relative: 25 %
Lymphs Abs: 2 10*3/uL (ref 0.7–4.0)
MCH: 24.4 pg — ABNORMAL LOW (ref 26.0–34.0)
MCHC: 29.9 g/dL — ABNORMAL LOW (ref 30.0–36.0)
MCV: 81.4 fL (ref 80.0–100.0)
Monocytes Absolute: 0.7 10*3/uL (ref 0.1–1.0)
Monocytes Relative: 8 %
Neutro Abs: 5.1 10*3/uL (ref 1.7–7.7)
Neutrophils Relative %: 66 %
Platelets: 366 10*3/uL (ref 150–400)
RBC: 3.49 MIL/uL — ABNORMAL LOW (ref 3.87–5.11)
RDW: 20.7 % — ABNORMAL HIGH (ref 11.5–15.5)
WBC: 8 10*3/uL (ref 4.0–10.5)
nRBC: 0 % (ref 0.0–0.2)

## 2019-05-19 LAB — BASIC METABOLIC PANEL
Anion gap: 12 (ref 5–15)
BUN: 7 mg/dL — ABNORMAL LOW (ref 8–23)
CO2: 25 mmol/L (ref 22–32)
Calcium: 9.3 mg/dL (ref 8.9–10.3)
Chloride: 101 mmol/L (ref 98–111)
Creatinine, Ser: 0.74 mg/dL (ref 0.44–1.00)
GFR calc Af Amer: >60 mL/min (ref >60)
GFR calc non Af Amer: >60 mL/min (ref >60)
Glucose, Bld: 101 mg/dL — ABNORMAL HIGH (ref 70–99)
Potassium: 3.4 mmol/L — ABNORMAL LOW (ref 3.5–5.1)
Sodium: 138 mmol/L (ref 135–145)

## 2019-05-19 LAB — GLUCOSE, CAPILLARY
Glucose-Capillary: 110 mg/dL — ABNORMAL HIGH (ref 70–99)
Glucose-Capillary: 88 mg/dL (ref 70–99)
Glucose-Capillary: 106 mg/dL — ABNORMAL HIGH (ref 70–99)

## 2019-05-19 LAB — MAGNESIUM: Magnesium: 1.6 mg/dL — ABNORMAL LOW (ref 1.7–2.4)

## 2019-05-19 SURGERY — ENTEROSCOPY
Anesthesia: Monitor Anesthesia Care

## 2019-05-19 MED ORDER — PROPOFOL 500 MG/50ML IV EMUL
INTRAVENOUS | Status: DC | PRN
  Administered 2019-05-19: 100 ug/kg/min via INTRAVENOUS

## 2019-05-19 MED ORDER — SUCRALFATE 1 GM/10ML PO SUSP: 1.0000 g | Freq: Three times a day (TID) | ORAL | Status: DC

## 2019-05-19 MED ORDER — SODIUM CHLORIDE 0.9 % IV SOLN: INTRAVENOUS | Status: DC

## 2019-05-19 MED ORDER — POTASSIUM CHLORIDE 10 MEQ/100ML IV SOLN
10.0000 meq | INTRAVENOUS | Status: AC
  Administered 2019-05-19 (×3): 10 meq via INTRAVENOUS
  Filled 2019-05-19 (×4): qty 100

## 2019-05-19 MED ORDER — PROPOFOL 10 MG/ML IV BOLUS
INTRAVENOUS | Status: DC | PRN
  Administered 2019-05-19: 20 mg via INTRAVENOUS

## 2019-05-19 MED ORDER — LACTATED RINGERS IV SOLN
INTRAVENOUS | Status: DC
  Administered 2019-05-19: 14:00:00 via INTRAVENOUS

## 2019-05-19 MED ORDER — SUCRALFATE 1 GM/10ML PO SUSP: 1.0000 g | Freq: Four times a day (QID) | ORAL | 0 refills | Status: DC

## 2019-05-19 MED ORDER — LIDOCAINE 2% (20 MG/ML) 5 ML SYRINGE
INTRAMUSCULAR | Status: DC | PRN
  Administered 2019-05-19: 80 mg via INTRAVENOUS

## 2019-05-19 MED ORDER — MAGNESIUM SULFATE 2 GM/50ML IV SOLN
2.0000 g | Freq: Once | INTRAVENOUS | Status: DC
  Filled 2019-05-19: qty 50

## 2019-05-19 MED ORDER — MAG-OXIDE 200 MG PO TABS: 200.0000 mg | ORAL_TABLET | Freq: Two times a day (BID) | ORAL | 0 refills | Status: AC

## 2019-05-19 NOTE — Progress Notes (Signed)
I have been made aware of pt's need for GI consult and will be seeing her later this morning.  Chart reviewed; it is unlikely that repeat GI studies would add anything but will discuss w/ pt when I see her.  I anticipate the solution to her recurrent GI bleeding problem will need to be permanent cessation of her anticoagulation.  Depending on her Cardiologist's opinion regarding how risky that would be, we might want to give her "one more chance" with resumption of anticoagulation, and/or repeat all GI studies before making that decision.    Accordingly, would obtain Cardiology consultation regarding their opinion as to how essential anticoagulation is in this patient.  Cleotis Nipper, M.D. Pager [phone] If no answer or after 5 PM call [phone]

## 2019-05-19 NOTE — Transfer of Care (Signed)
Immediate Anesthesia Transfer of Care Note  Patient: Janet Williamson  Procedure(s) Performed: ENTEROSCOPY (N/A ) BIOPSY  Patient Location: Endoscopy Unit  Anesthesia Type:MAC  Level of Consciousness: drowsy and patient cooperative  Airway & Oxygen Therapy: Patient Spontanous Breathing and Patient connected to nasal cannula oxygen  Post-op Assessment: Report given to RN, Post -op Vital signs reviewed and stable and Patient moving all extremities X 4  Post vital signs: Reviewed and stable  Last Vitals:  Vitals Value Taken Time  BP    Temp    Pulse    Resp    SpO2      Last Pain:  Vitals:   05/19/19 1344  TempSrc: Temporal  PainSc: 0-No pain         Complications: No apparent anesthesia complications

## 2019-05-19 NOTE — H&P (View-Only) (Signed)
Referring Provider:  Dr. Hughie Closs Primary Care Physician:  Maurice Small, MD Primary Gastroenterologist:  Dr. Ewing Schlein  Reason for Consultation: Recurrent GI bleeding of obscure origin  HPI: Jasmeen P Gage is a 78 y.o. female on chronic anticoagulation with Xarelto for atrial fibrillation, who was in the hospital last month with anemic symptoms and a recurrent low-grade GI bleed.    She had had a previous colonoscopy roughly 10 years earlier by Dr. Ewing Schlein that was negative.   She was in the hospital last December and underwent endoscopy that showed nonbleeding erosive gastropathy, but was otherwise negative, colonoscopy that was negative, and a video capsule endoscopy that was negative.  Anticoagulation was resumed and she had recurrent blood loss with anemia approximately 3 weeks ago, at which time endoscopic evaluation was negative.  She was admitted to the hospital, for the third time, last night with anemic symptoms (d.o.e.) and a one-day day history of frequent, loose dark stools (on iron), Hemoccult positive, minimal elevation of BUN at 19, and a post hydration hemoglobin of 7.3, as compared to a discharge hemoglobin of 7.5 on July 17.  She was admitted and transfused and her hemoglobin has remained stable since transfusion and her BUN has come down.   Past Medical History:  Diagnosis Date  . Anemia   . Cardiac mass    a. 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 Not seen on most recent TEE 2019.  . Cardiomyopathy in other disease   . Cataracts, bilateral   . Diabetes mellitus    Type 2  . Generalized osteoarthritis   . GERD (gastroesophageal reflux disease)   . HLD (hyperlipidemia)   . Hypertension   . Hypothyroidism   . LBBB (left bundle branch block)   . PAF (paroxysmal atrial fibrillation) (HCC)    a. s/p DCCV 06/2017, on Xarelto  . Phlebitis   . S/P TAVR (transcatheter aortic valve replacement) 04/15/2018   Edwards Sapien 3 THV (size 23 mm, model # [id number], serial  # [id number]) via the TF approach  . Severe aortic stenosis    a. s/p TAVR 04/2018.  . Stroke [facility]) 2008    Past Surgical History:  Procedure Laterality Date  . ABDOMINAL HYSTERECTOMY    . COLONOSCOPY    . COLONOSCOPY WITH PROPOFOL N/A 10/02/2018   Procedure: COLONOSCOPY WITH PROPOFOL;  Surgeon: [name], MD;  Location: [facility] ENDOSCOPY;  Service: Endoscopy;  Laterality: N/A;  . ESOPHAGOGASTRODUODENOSCOPY (EGD) WITH PROPOFOL N/A 10/02/2018   Procedure: ESOPHAGOGASTRODUODENOSCOPY (EGD) WITH PROPOFOL;  Surgeon: [name], MD;  Location: [facility] ENDOSCOPY;  Service: Endoscopy;  Laterality: N/A;  . ESOPHAGOGASTRODUODENOSCOPY (EGD) WITH PROPOFOL N/A 04/23/2019   Procedure: ESOPHAGOGASTRODUODENOSCOPY (EGD) WITH PROPOFOL;  Surgeon: Graylin Shiver, MD;  Location: [facility] ENDOSCOPY;  Service: Endoscopy;  Laterality: N/A;  . EYE SURGERY Bilateral    cataract removal  . GIVENS CAPSULE STUDY N/A 10/02/2018   Procedure: GIVENS CAPSULE STUDY;  Surgeon: [name], MD;  Location: [facility] ENDOSCOPY;  Service: Endoscopy;  Laterality: N/A;  . RIGHT/LEFT HEART CATH AND CORONARY ANGIOGRAPHY N/A 03/06/2018   Procedure: RIGHT/LEFT HEART CATH AND CORONARY ANGIOGRAPHY;  Surgeon: Lyn Records, MD;  Location: [facility] INVASIVE CV LAB;  Service: Cardiovascular;  Laterality: N/A;  . TEE WITHOUT CARDIOVERSION N/A 04/01/2018   Procedure: TRANSESOPHAGEAL ECHOCARDIOGRAM (TEE);  Surgeon: Jake Bathe, MD;  Location: [facility] ENDOSCOPY;  Service: Cardiovascular;  Laterality: N/A;  . TEE WITHOUT CARDIOVERSION N/A 04/15/2018   Procedure: TRANSESOPHAGEAL ECHOCARDIOGRAM (TEE);  Surgeon: Tonny Bollman, MD;  Location: [facility] OR;  Service: Open Heart Surgery;  Laterality: N/A;  . TOTAL KNEE ARTHROPLASTY Right 04/14/2013   Dr Marlou Sa  . TOTAL KNEE ARTHROPLASTY Right 04/14/2013   Procedure: TOTAL KNEE ARTHROPLASTY;  Surgeon: Meredith Pel, MD;  Location: [place];  Service: Orthopedics;  Laterality: Right;  . TRANSCATHETER AORTIC VALVE REPLACEMENT,  TRANSFEMORAL  04/15/2018  . TRANSCATHETER AORTIC VALVE REPLACEMENT, TRANSFEMORAL Bilateral 04/15/2018   Procedure: TRANSCATHETER AORTIC VALVE REPLACEMENT, TRANSFEMORAL;  Surgeon: Sherren Mocha, MD;  Location: [place];  Service: Open Heart Surgery;  Laterality: Bilateral;    Prior to Admission medications   Medication Sig Start Date End Date Taking? Authorizing Provider  acetaminophen (TYLENOL) 325 MG tablet Take 325-650 mg by mouth every 8 (eight) hours as needed (for headaches).    Yes [provider]  amLODipine (NORVASC) 5 MG tablet Take 5 mg by mouth daily.   Yes [provider]  Cholecalciferol (VITAMIN D3) 5000 units CAPS Take 5,000 Units by mouth daily.   Yes [provider]  ferrous sulfate 325 (65 FE) MG tablet Take 1 tablet (325 mg total) by mouth daily. 04/24/19 07/23/19 Yes Dessa Phi, DO  hydrochlorothiazide (HYDRODIURIL) 25 MG tablet Take 25 mg by mouth daily.    Yes [provider]  metFORMIN (GLUCOPHAGE-XR) 750 MG 24 hr tablet TAKE 2 TABLETS BY MOUTH ONCE DAILY WITH BREAKFAST Patient taking differently: Take 1,500 mg by mouth daily with breakfast.  05/04/19  Yes Elayne Snare, MD  metoprolol succinate (TOPROL-XL) 50 MG 24 hr tablet Take 50 mg by mouth daily.    Yes [provider]  pantoprazole (PROTONIX) 40 MG tablet Take 1 tablet (40 mg total) by mouth 2 (two) times daily. 04/24/19  Yes Dessa Phi, DO  Polyethyl Glycol-Propyl Glycol (SYSTANE OP) Place 1 drop into both eyes 2 (two) times daily.    Yes [provider]  simvastatin (ZOCOR) 20 MG tablet Take 20 mg by mouth every evening.   Yes [provider]  XARELTO 20 MG TABS tablet TAKE 1 TABLET BY MOUTH ONCE DAILY WITH SUPPER Patient taking differently: Take 20 mg by mouth daily with supper. High risk med: Anticoagulant.  Crushed Xarelto can be given down a G-tube but NOT a J-Tube. 05/15/19  Yes Jerline Pain, MD  glucose blood (ACCU-CHEK AVIVA) test strip Use as  instructed to check once daily DX: E11.9 07/02/18   Elayne Snare, MD    Current Facility-Administered Medications  Medication Dose Route Frequency Provider Last Rate Last Dose  . 0.9 %  sodium chloride infusion (Manually program via Guardrails IV Fluids)   Intravenous Once Truett Mainland, DO      . amLODipine (NORVASC) tablet 5 mg  5 mg Oral Daily Truett Mainland, DO   5 mg at 05/19/19 9937  . ferrous sulfate tablet 325 mg  325 mg Oral TID WC Truett Mainland, DO   325 mg at 05/19/19 1696  . hydrochlorothiazide (HYDRODIURIL) tablet 25 mg  25 mg Oral Daily Truett Mainland, DO   25 mg at 05/19/19 7893  . insulin aspart (novoLOG) injection 0-15 Units  0-15 Units Subcutaneous TID WC Truett Mainland, DO      . insulin aspart (novoLOG) injection 0-5 Units  0-5 Units Subcutaneous QHS Stinson, Jacob J, DO      . magnesium sulfate IVPB 2 g 50 mL  2 g Intravenous Once Darliss Cheney, MD      . metFORMIN (GLUCOPHAGE-XR) 24 hr tablet 1,500 mg  1,500 mg Oral Q  breakfast Levie HeritageStinson, Jacob J, DO   1,500 mg at 05/18/19 [id number]  . metoprolol succinate (TOPROL-XL) 24 hr tablet 50 mg  50 mg Oral Daily Levie HeritageStinson, Jacob J, DO   50 mg at 05/19/19 [id number]  . ondansetron (ZOFRAN) tablet 4 mg  4 mg Oral Q6H PRN Levie HeritageStinson, Jacob J, DO       Or  . ondansetron [facility](ZOFRAN) injection 4 mg  4 mg Intravenous Q6H PRN Levie HeritageStinson, Jacob J, DO      . pantoprazole (PROTONIX) injection 40 mg  40 mg Intravenous Q12H Levie HeritageStinson, Jacob J, DO   40 mg at 05/19/19 0344  . simvastatin (ZOCOR) tablet 20 mg  20 mg Oral QPM Levie HeritageStinson, Jacob J, DO   20 mg at 05/18/19 1804    Allergies as of 05/17/2019 - Review Complete 05/17/2019  Allergen Reaction Noted  . Ace inhibitors Swelling and Other (See Comments) 10/05/2013  . Keflex [cephalexin] Swelling and Other (See Comments) 10/05/2013  . Rifadin [rifampin] Swelling and Other (See Comments) 10/05/2013    Family History  Problem Relation Age of Onset  . Stroke Mother   . Hypertension Mother   . Hypertension  Father   . Heart attack Neg Hx     Social History   Socioeconomic History  . Marital status: Widowed    Spouse name: Not on file  . Number of children: Not on file  . Years of education: Not on file  . Highest education level: Not on file  Occupational History  . Not on file  Social Needs  . Financial resource strain: Not on file  . Food insecurity    Worry: Not on file    Inability: Not on file  . Transportation needs    Medical: Not on file    Non-medical: Not on file  Tobacco Use  . Smoking status: Never Smoker  . Smokeless tobacco: Never Used  Substance and Sexual Activity  . Alcohol use: No  . Drug use: No  . Sexual activity: Not Currently  Lifestyle  . Physical activity    Days per week: Not on file    Minutes per session: Not on file  . Stress: Not on file  Relationships  . Social Musicianconnections    Talks on phone: Not on file    Gets together: Not on file    Attends religious service: Not on file    Active member of club or organization: Not on file    Attends meetings of clubs or organizations: Not on file    Relationship status: Not on file  . Intimate partner violence    Fear of current or ex partner: Not on file    Emotionally abused: Not on file    Physically abused: Not on file    Forced sexual activity: Not on file  Other Topics Concern  . Not on file  Social History Narrative  . Not on file    Review of Systems: No chest pain or current shortness of breath, perhaps some mild leg swelling recently, no GI symptoms such as nausea, epigastric pain, or dysphagia.  No skin rashes or arthropathy symptoms.  Physical Exam: Vital signs in last 24 hours: Temp:  [97.9 F (36.6 C)-98.8 F (37.1 C)] 98.8 F (37.1 C) (08/10 2120) Pulse Rate:  [66-75] 66 (08/11 0800) Resp:  [16-22] 22 (08/10 2120) BP: (121-126)/(31-62) 121/60 (08/11 0800) SpO2:  [97 %-100 %] 100 % (08/11 0800) Last BM Date: 05/19/19 General:   Alert,  Well-developed, well-nourished,  pleasant  and cooperative in NAD, sitting in bedside chair Head:  Normocephalic and atraumatic. Eyes:  Sclera clear, no icterus.   Conjunctiva pink. Mouth:   No ulcerations or lesions.  Oropharynx pink & moist. Lungs:  Clear throughout to auscultation.   No wheezes, crackles, or rhonchi. No evident respiratory distress. Heart:   Regular rate and rhythm at this time; no murmurs, clicks, rubs,  or gallops. Abdomen:  Soft, nontender, and nondistended. d.   Rectal: Not performed, but showed heme positive stool at time of admission yesterday Msk:   Symmetrical without gross deformities. Pulses:  Normal radial pulse is noted. Extremities:   Without clubbing, cyanosis, or edema. Neurologic:  Alert and coherent;  grossly normal neurologically. Skin:  Intact without significant lesions or rashes. Psych:   Alert and cooperative. Normal mood and affect.  Intake/Output from previous day: 08/10 0701 - 08/11 0700 In: 960 [P.O.:960] Out: -  Intake/Output this shift: No intake/output data recorded.  Lab Results: Recent Labs    05/17/19 2159 05/18/19 0544 05/18/19 1701 05/19/19 0212  WBC 10.3 9.0  --  8.0  HGB 7.3* 8.2* 8.5* 8.5*  HCT 24.5* 27.4* 28.1* 28.4*  PLT 358 354  --  366   BMET Recent Labs    05/17/19 1341 05/18/19 0544 05/19/19 0212  NA 140 138 138  K 3.3* 3.6 3.4*  CL 103 101 101  CO2 26 28 25   GLUCOSE 132* 97 101*  BUN 19 13 7*  CREATININE 0.80 0.65 0.74  CALCIUM 9.8 9.2 9.3   LFT Recent Labs    05/17/19 1341  PROT 7.5  ALBUMIN 3.3*  AST 16  ALT 10  ALKPHOS 49  BILITOT 0.3   PT/INR No results for input(s): LABPROT, INR in the last 72 hours.  Studies/Results: Dg Chest Portable 1 View  Result Date: 05/17/2019 CLINICAL DATA:  78 year old female with history of shortness of breath. EXAM: PORTABLE CHEST 1 VIEW COMPARISON:  Chest x-ray 04/21/2019. FINDINGS: Lung volumes are normal. No consolidative airspace disease. No pleural effusions. No pneumothorax. No  pulmonary nodule or mass noted. Pulmonary vasculature and the cardiomediastinal silhouette are within normal limits. Atherosclerosis in the thoracic aorta. Sapien type transcatheter aortic valve incidentally noted. IMPRESSION: 1.  No radiographic evidence of acute cardiopulmonary disease. 2. Aortic atherosclerosis. 3. Status post TAVR. Electronically Signed   By: Trudie Reedaniel  Entrikin M.D.   On: 05/17/2019 15:27    Impression: Recurrent low-grade GI bleeding while on chronic anticoagulation.  Plan:    Given the dark stools (albeit admittedly on iron) and the mild rise in hemoglobin, I think that it is worth reexamining the upper tract in close timing to her recent melenic stools.  I expressed to the patient the likelihood that this would not show any source of bleeding, but sometimes, we will get lucky and find an evanescent lesion.  I have discussed the case with the attending hospitalist physician, and he and I both feel that if the endoscopy is negative, it would be reasonable to send the patient home without further evaluation for the time being, and seeing if her hemoglobin remained stable as an outpatient.  If it does, in a week or 2, anticoagulation could be resumed, perhaps in a stepwise upward fashion.  If the patient has recurrent bleeding on anticoagulation, at that point it would probably be worth repeating all of her studies (upper endoscopy, colonoscopy, and video capsule endoscopy) and if those studies were again unrevealing for source of blood loss, cardiology might have to get  involved to consider alternative anticoagulation regimen, or perhaps taking the patient off anticoagulation entirely, although from discussion with Dr. Jacqulyn BathPahwani, the patient has a relatively high chads score so we would like to try to avoid that if possible.  I have reviewed the nature, purpose, and risks of upper endoscopy with the patient and explained to her that I intend to use an extended range scope to perhaps  increase our yield for proximal small bowel abnormalities (which might have been missed on the capsule endoscopy).  She is agreeable to proceed.   LOS: 0 days   Katy FitchRobert V Hollee Fate  05/19/2019, 11:38 AM   Pager [phone] If no answer or after 5 PM call [phone]

## 2019-05-19 NOTE — Progress Notes (Signed)
AVS reviewed with patient PIV removed Discharged via wheelchair home with daughter

## 2019-05-19 NOTE — Progress Notes (Addendum)
Patient's proximal small bowel enteroscopy was well-tolerated, and shows possible proximal hemorrhagic gastritis (please see dictated note for details).    A similar appearance could be the result of scope trauma, but the overall appearance made me think that it was not an artifact of the procedure.  The remainder the exam, including the distal duodenum, was unremarkable.  Recommendations:  1.  I have ordered a diet for the patient 2.  I would recommend an empiric trial of sucralfate for a couple of months (in addition to the patient's baseline twice daily PPI therapy) to help heal the area of gastritis if it is indeed "real." 3.  As discussed earlier today with Dr. Doristine Bosworth, I think it is reasonable to let the patient be released from the hospital today, and observed for a short period of time off anticoagulation; if she shows no evidence of ongoing blood loss, anticoagulation could potentially be reinstituted, perhaps in a stepwise fashion while watching for recurrent blood loss. 4.  If the patient shows evidence of recurrent bleeding in the future, I would consider repeating all of her previous GI tests (specifically, upper endoscopy, video capsule endoscopy, and colonoscopy); at that point, if those tests were unrevealing, some modification of her anticoagulation regimen (if not actual cessation of anticoagulation) might become necessary.  The patient points out she never had issues with GI bleeding until her anticoagulation was started.  I will sign off.  However, please call me if you have any questions or if I can be of further assistance in this patient's care.  Follow up can be with her PCP, Dr. Benita Gutter if needed or desired, we will be on standby to also see the patient back in the office on request.  Dr. Watt Climes is her "original" gastroenterologist and Dr. Paulita Fujita has done most of the patient's recent studies.  Cleotis Nipper, M.D. Pager [phone] If no answer or after 5 PM  call [phone]

## 2019-05-19 NOTE — Anesthesia Preprocedure Evaluation (Addendum)
Anesthesia Evaluation  Patient identified by MRN, date of birth, ID band Patient awake    Reviewed: Allergy & Precautions, NPO status , Patient's Chart, lab work & pertinent test results, reviewed documented beta blocker date and time   History of Anesthesia Complications Negative for: history of anesthetic complications  Airway Mallampati: II  TM Distance: >3 FB Neck ROM: Full    Dental  (+) Upper Dentures, Lower Dentures   Pulmonary neg pulmonary ROS,    Pulmonary exam normal        Cardiovascular hypertension, Pt. on medications and Pt. on home beta blockers Normal cardiovascular exam+ dysrhythmias Atrial Fibrillation + Valvular Problems/Murmurs MR    S/p TAVR 2019  '20 TTE - EF >65%. There is moderate concentric left ventricular hypertrophy. Left ventricular diastolic Doppler parameters are consistent with impaired relaxation. RV cavity was mildly enlarged. Right ventricular systolic pressure is mildly elevated with an estimated pressure of 45.8 mmHg. Left and right atrial sizes were mildly dilated. Mitral valve regurgitation is moderate by color flow Doppler. Tricuspid valve regurgitation is moderate. Moderate stenosis of the aortic valve.    Neuro/Psych CVA, No Residual Symptoms negative psych ROS   GI/Hepatic Neg liver ROS, GERD  Medicated and Controlled,  Endo/Other  diabetes, Type 2 Obesity   Renal/GU negative Renal ROS     Musculoskeletal  (+) Arthritis , Osteoarthritis,    Abdominal   Peds  Hematology  (+) anemia ,   Anesthesia Other Findings   Reproductive/Obstetrics                            Anesthesia Physical Anesthesia Plan  ASA: III  Anesthesia Plan: MAC   Post-op Pain Management:    Induction: Intravenous  PONV Risk Score and Plan: 2 and Propofol infusion and Treatment may vary due to age or medical condition  Airway Management Planned: Nasal Cannula and  Natural Airway  Additional Equipment: None  Intra-op Plan:   Post-operative Plan:   Informed Consent: I have reviewed the patients History and Physical, chart, labs and discussed the procedure including the risks, benefits and alternatives for the proposed anesthesia with the patient or authorized representative who has indicated his/her understanding and acceptance.       Plan Discussed with: CRNA and Anesthesiologist  Anesthesia Plan Comments:        Anesthesia Quick Evaluation

## 2019-05-19 NOTE — Discharge Summary (Addendum)
Physician Discharge Summary  Arma HeadingRosita P Williamson WUJ:[id number] DOB: 02/28/1941 DOA: 05/17/2019  PCP: Maurice SmallGriffin, [name], MD  Admit date: 05/17/2019 Discharge date: 05/19/2019  Admitted From: Home Disposition: Home  Recommendations for Outpatient Follow-up:  1. Follow up with PCP in 1-2 weeks 2. Follow with GI as needed 3. Please obtain BMP/CBC in one week 4. Please follow up on the following pending results:  Home Health: None Equipment/Devices: None  Discharge Condition: Stable CODE STATUS: Full code Diet recommendation: Cardiac  Subjective: Seen and examined this morning before EGD.  She had no complaint.  Denied any shortness of breath, palpitation, chest pain, shortness of breath.  Was eager to go home.  Brief/Interim Summary: Janet P Townsendis a 78 y.o.femalewith a history of type 2 diabetes, GERD, hypertension, atrial fibrillation on Xarelto, anemia, history of GI bleed. Patient was recently hospitalized from 04/21/2019 04/24/2019 for the same complaints of dyspnea and melena. Patient was evaluated by GI for EGD without source of bleeding identified.  She was discharged home andHer Xarelto was resumed. She had no symptoms until 05/16/2019 when she began having melena and dyspnea on exertion. When her symptoms continued she came to the emergency department on 05/17/2019.  She was admitted under hospitalist service.  Due to symptomatic anemia despite of having hemoglobin of 7.9 which eventually dropped to 7.3, she received 1 blood unit transfusion and her repeat hemoglobin today is 8.2.  After transfusion, her hemoglobin remained stable around 8.5 and patient symptoms resolved.  GI was consulted and patient underwent EGD once again which showed possible proximal hemorrhagic gastritis but no ulcer or any signs of active bleeding.  She was cleared by GI to be discharged with instructions to continue Protonix 40 mg p.o. twice daily as well as sucralfate for 14 days.  GI and I both discussed in  detail about her anticoagulation and inability to tolerate that with repeated episodes of intermittent GI bleed and we decided to hold her Xarelto for at least a week until she is seen by her PCP and we recommend that her CBC should be repeated at her PCPs visit next week and if her hemoglobin remains a stable then she can be challenged on smaller dose of Xarelto with repeat visit with PCP in 2 weeks and repeat CBC and further decision based on that CBCs results.  We defer that to her PCP.   Discharge Diagnoses:  Principal Problem:   Symptomatic anemia Active Problems:   DM (diabetes mellitus) (HCC)   HTN (hypertension)   PAF (paroxysmal atrial fibrillation) (HCC)   GI bleed    Discharge Instructions  Discharge Instructions    Discharge patient   Complete by: As directed    Discharge disposition: 01-Home or Self Care   Discharge patient date: 05/19/2019     Allergies as of 05/19/2019      Reactions   Ace Inhibitors Swelling, Other (See Comments)   Angioedema   Keflex [cephalexin] Swelling, Other (See Comments)   Lips became swollwn   Rifadin [rifampin] Swelling, Other (See Comments)   Lips became swollen      Medication List    STOP taking these medications   Xarelto 20 MG Tabs tablet Generic drug: rivaroxaban     TAKE these medications   acetaminophen 325 MG tablet Commonly known as: TYLENOL Take 325-650 mg by mouth every 8 (eight) hours as needed (for headaches).   amLODipine 5 MG tablet Commonly known as: NORVASC Take 5 mg by mouth daily.   ferrous sulfate 325 (65  FE) MG tablet Take 1 tablet (325 mg total) by mouth daily.   glucose blood test strip Commonly known as: Accu-Chek Aviva Use as instructed to check once daily DX: E11.9   hydrochlorothiazide 25 MG tablet Commonly known as: HYDRODIURIL Take 25 mg by mouth daily.   Mag-Oxide 200 MG Tabs Generic drug: Magnesium Oxide Take 1 tablet (200 mg total) by mouth 2 (two) times daily for 5 days.    metFORMIN 750 MG 24 hr tablet Commonly known as: GLUCOPHAGE-XR TAKE 2 TABLETS BY MOUTH ONCE DAILY WITH BREAKFAST What changed:   how much to take  how to take this  when to take this  additional instructions   metoprolol succinate 50 MG 24 hr tablet Commonly known as: TOPROL-XL Take 50 mg by mouth daily.   pantoprazole 40 MG tablet Commonly known as: PROTONIX Take 1 tablet (40 mg total) by mouth 2 (two) times daily.   simvastatin 20 MG tablet Commonly known as: ZOCOR Take 20 mg by mouth every evening.   sucralfate 1 GM/10ML suspension Commonly known as: Carafate Take 10 mLs (1 g total) by mouth 4 (four) times daily for 15 days.   SYSTANE OP Place 1 drop into both eyes 2 (two) times daily.   Vitamin D3 125 MCG (5000 UT) Caps Take 5,000 Units by mouth daily.      Follow-up Information    Kelton Pillar, MD Follow up in 1 week(s).   Specialty: Family Medicine Contact information: [address] [place] [address] [phone]        Jerline Pain, MD .   Specialty: Cardiology Contact information: [phone] [place]  [address] [phone]          Allergies  Allergen Reactions  . Ace Inhibitors Swelling and Other (See Comments)    Angioedema   . Keflex [Cephalexin] Swelling and Other (See Comments)    Lips became swollwn  . Rifadin [Rifampin] Swelling and Other (See Comments)    Lips became swollen    Consultations: GI   Procedures/Studies: Dg Chest 2 View  Result Date: 04/21/2019 CLINICAL DATA:  Shortness of breath for 5 days. EXAM: CHEST - 2 VIEW COMPARISON:  PA and lateral chest 09/29/2018 and 07/06/2017. FINDINGS: The patient is status post aortic valve replacement. Heart size is normal. Aortic atherosclerosis is noted. Lungs are clear. No pneumothorax or pleural effusion. No acute or focal bony abnormality. IMPRESSION: No acute disease. Atherosclerosis. Electronically Signed   By: Inge Rise M.D.   On: 04/21/2019 14:55   Dg Chest Portable 1 View  Result Date: 05/17/2019 CLINICAL DATA:  78 year old female with history of shortness of breath. EXAM: PORTABLE CHEST 1 VIEW COMPARISON:  Chest x-ray 04/21/2019. FINDINGS: Lung volumes are normal. No consolidative airspace disease. No pleural effusions. No pneumothorax. No pulmonary nodule or mass noted. Pulmonary vasculature and the cardiomediastinal silhouette are within normal limits. Atherosclerosis in the thoracic aorta. Sapien type transcatheter aortic valve incidentally noted. IMPRESSION: 1.  No radiographic evidence of acute cardiopulmonary disease. 2. Aortic atherosclerosis. 3. Status post TAVR. Electronically Signed   By: Vinnie Langton M.D.   On: 05/17/2019 15:27     Discharge Exam: Vitals:   05/19/19 1445 05/19/19 1500  BP: (!) 119/55 (!) 128/52  Pulse: 69 64  Resp: 16 15  Temp:    SpO2:  99%   Vitals:   05/19/19 1344 05/19/19 1435 05/19/19 1445 05/19/19 1500  BP: (!) 155/57 107/88 (!) 119/55 (!) 128/52  Pulse: 66 68 69 64  Resp: 17 17 16 15   Temp: 97.9 F (36.6 C) 97.8 F (36.6 C)    TempSrc: Temporal Temporal    SpO2: 100% 98%  99%  Weight:      Height:        General: Pt is alert, awake, not in acute distress Cardiovascular: RRR, S1/S2 +, no rubs, no gallops Respiratory: CTA bilaterally, no wheezing, no rhonchi Abdominal: Soft, NT, ND, bowel sounds + Extremities: no edema, no cyanosis    The results of significant diagnostics from this hospitalization (including imaging, microbiology, ancillary and laboratory) are listed below for reference.     Microbiology: Recent Results (from the past 240 hour(s))  SARS CORONAVIRUS 2 Nasal Swab Aptima Multi Swab     Status: None   Collection Time: 05/17/19  6:57 PM   Specimen: Aptima Multi Swab; Nasal Swab  Result Value Ref Range Status   SARS Coronavirus 2 NEGATIVE NEGATIVE Final    Comment: (NOTE) SARS-CoV-2 target nucleic acids are NOT  DETECTED. The SARS-CoV-2 RNA is generally detectable in upper and lower respiratory specimens during the acute phase of infection. Negative results do not preclude SARS-CoV-2 infection, do not rule out co-infections with other pathogens, and should not be used as the sole basis for treatment or other patient management decisions. Negative results must be combined with clinical observations, patient history, and epidemiological information. The expected result is Negative. Fact Sheet for Patients: HairSlick.nohttps://www.fda.gov/media/138098/download Fact Sheet for Healthcare Providers: quierodirigir.comhttps://www.fda.gov/media/138095/download This test is not yet approved or cleared by the [place] FDA and  has been authorized for detection and/or diagnosis of SARS-CoV-2 by FDA under an Emergency Use Authorization (EUA). This EUA will remain  in effect (meaning this test can be used) for the duration of the COVID-19 declaration under Section 56 4(b)(1) of the Act, 21 U.S.C. section 360bbb-3(b)(1), unless the authorization is terminated or revoked sooner. Performed at [facility], [address] [place], [place] [address]      Labs: BNP (last 3 results) Recent Labs    09/29/18 2148  BNP 154.7*   Basic Metabolic Panel: Recent Labs  Lab 05/17/19 1341 05/18/19 0544 05/19/19 0212  NA 140 138 138  K 3.3* 3.6 3.4*  CL 103 101 101  CO2 26 28 25   GLUCOSE 132* 97 101*  BUN 19 13 7*  CREATININE 0.80 0.65 0.74  CALCIUM 9.8 9.2 9.3  MG  --   --  1.6*   Liver Function Tests: Recent Labs  Lab 05/17/19 1341  AST 16  ALT 10  ALKPHOS 49  BILITOT 0.3  PROT 7.5  ALBUMIN 3.3*   No results for input(s): LIPASE, AMYLASE in the last 168 hours. No results for input(s): AMMONIA in the last 168 hours. CBC: Recent Labs  Lab 05/17/19 1341 05/17/19 2159 05/18/19 0544 05/18/19 1701 05/19/19 0212  WBC 9.3 10.3 9.0  --  8.0  NEUTROABS  --   --   --   --  5.1  HGB 7.9* 7.3* 8.2* 8.5* 8.5*   HCT 27.0* 24.5* 27.4* 28.1* 28.4*  MCV 80.4 79.0* 81.5  --  81.4  PLT 422* 358 354  --  366   Cardiac Enzymes: No results for input(s): CKTOTAL, CKMB, CKMBINDEX, TROPONINI in the last 168 hours. BNP: Invalid input(s): POCBNP CBG: Recent Labs  Lab 05/18/19 1610 05/18/19 2117 05/19/19 0820 05/19/19 1200 05/19/19 1353  GLUCAP 98 94 110* 88 106*   D-Dimer No results for input(s): DDIMER in  the last 72 hours. Hgb A1c Recent Labs    05/17/19 2159  HGBA1C 4.7*   Lipid Profile No results for input(s): CHOL, HDL, LDLCALC, TRIG, CHOLHDL, LDLDIRECT in the last 72 hours. Thyroid function studies No results for input(s): TSH, T4TOTAL, T3FREE, THYROIDAB in the last 72 hours.  Invalid input(s): FREET3 Anemia work up No results for input(s): VITAMINB12, FOLATE, FERRITIN, TIBC, IRON, RETICCTPCT in the last 72 hours. Urinalysis    Component Value Date/Time   COLORURINE YELLOW 10/30/2018 0936   APPEARANCEUR Sl Cloudy (A) 10/30/2018 0936   LABSPEC 1.020 10/30/2018 0936   PHURINE 5.5 10/30/2018 0936   GLUCOSEU NEGATIVE 10/30/2018 0936   HGBUR NEGATIVE 10/30/2018 0936   BILIRUBINUR NEGATIVE 10/30/2018 0936   KETONESUR NEGATIVE 10/30/2018 0936   PROTEINUR NEGATIVE 09/30/2018 0013   UROBILINOGEN 0.2 10/30/2018 0936   NITRITE NEGATIVE 10/30/2018 0936   LEUKOCYTESUR NEGATIVE 10/30/2018 0936   Sepsis Labs Invalid input(s): PROCALCITONIN,  WBC,  LACTICIDVEN Microbiology Recent Results (from the past 240 hour(s))  SARS CORONAVIRUS 2 Nasal Swab Aptima Multi Swab     Status: None   Collection Time: 05/17/19  6:57 PM   Specimen: Aptima Multi Swab; Nasal Swab  Result Value Ref Range Status   SARS Coronavirus 2 NEGATIVE NEGATIVE Final    Comment: (NOTE) SARS-CoV-2 target nucleic acids are NOT DETECTED. The SARS-CoV-2 RNA is generally detectable in upper and lower respiratory specimens during the acute phase of infection. Negative results do not preclude SARS-CoV-2 infection, do not  rule out co-infections with other pathogens, and should not be used as the sole basis for treatment or other patient management decisions. Negative results must be combined with clinical observations, patient history, and epidemiological information. The expected result is Negative. Fact Sheet for Patients: HairSlick.no Fact Sheet for Healthcare Providers: quierodirigir.com This test is not yet approved or cleared by the [place] FDA and  has been authorized for detection and/or diagnosis of SARS-CoV-2 by FDA under an Emergency Use Authorization (EUA). This EUA will remain  in effect (meaning this test can be used) for the duration of the COVID-19 declaration under Section 56 4(b)(1) of the Act, 21 U.S.C. section 360bbb-3(b)(1), unless the authorization is terminated or revoked sooner. Performed at [facility], [address] [place], [place] [address]      Time coordinating discharge: Over 30 minutes  SIGNED:   Hughie Closs, MD  [facility] 05/19/2019, 4:38 PM Pager [phone]  If 7PM-7AM, please contact night-coverage [email or web address] Password [email or web address]

## 2019-05-19 NOTE — Anesthesia Postprocedure Evaluation (Signed)
Anesthesia Post Note  Patient: Janet Williamson  Procedure(s) Performed: ENTEROSCOPY (N/A ) BIOPSY     Patient location during evaluation: PACU Anesthesia Type: MAC Level of consciousness: awake and alert Pain management: pain level controlled Vital Signs Assessment: post-procedure vital signs reviewed and stable Respiratory status: spontaneous breathing, nonlabored ventilation and respiratory function stable Cardiovascular status: stable and blood pressure returned to baseline Anesthetic complications: no    Last Vitals:  Vitals:   05/19/19 1435 05/19/19 1445  BP: 107/88 (!) 119/55  Pulse: 68 69  Resp: 17 16  Temp: 36.6 C   SpO2: 98%     Last Pain:  Vitals:   05/19/19 1445  TempSrc:   PainSc: 0-No pain                 Audry Pili

## 2019-05-19 NOTE — Progress Notes (Signed)
Due to loss of 2 iv accesses and gi procedure patient only received 2/4 potassium ordered and did not receive mag iv. MD notified will give oral mag since patient is discharging today.

## 2019-05-19 NOTE — Interval H&P Note (Signed)
History and Physical Interval Note:  05/19/2019 2:05 PM  Janet Williamson  has presented today for surgery, with the diagnosis of post hemorrhagic anemia.  The various methods of treatment have been discussed with the patient. After consideration of risks, benefits and other options for treatment, the patient has consented to  Procedure(s): ENTEROSCOPY (N/A) as a surgical intervention.  The patient's history has been reviewed, patient examined, no change in status, stable for surgery.  I have reviewed the patient's chart and labs.  Questions were answered to the patient's satisfaction.     [name]

## 2019-05-19 NOTE — Progress Notes (Signed)
Call from [place] stated ST is elevated by 2. HR 68. No complaints by patient at this time. Paged MD. Order to obtain EKG.

## 2019-05-19 NOTE — Op Note (Signed)
[facility] Patient Name: Janet OuchRosita Garrison Procedure Date : 05/19/2019 MRN: [id number] Attending MD: Bernette Redbirdobert Mahamadou Weltz , MD Date of Birth: 09/28/1941 CSN: [id number] Age: [age] Admit Type: Inpatient Procedure:                Small bowel enteroscopy Indications:              GI bleeding source not documented by previous UGI                            endoscopy, GI bleeding source not documented by                            previous colonoscopy, GI bleeding source not                            documented by previous video capsule endoscopy Providers:                Bernette Redbirdobert Sobia Karger, MD, Clearnce Sorrelarrie Baker, RN, Roslynn Ambleori                            Philipps, RN, Beryle BeamsJanie Billups, Technician Referring MD:              Medicines:                Monitored Anesthesia Care Complications:            No immediate complications. Estimated Blood Loss:     Estimated blood loss was minimal. Procedure:                Pre-Anesthesia Assessment:                           - Prior to the procedure, a History and Physical                            was performed, and patient medications and                            allergies were reviewed. The patient's tolerance of                            previous anesthesia was also reviewed. The risks                            and benefits of the procedure and the sedation                            options and risks were discussed with the patient.                            All questions were answered, and informed consent                            was obtained. Prior Anticoagulants: The patient has  taken Xarelto (rivaroxaban), last dose was 3 days                            prior to procedure. ASA Grade Assessment: III - A                            patient with severe systemic disease. After                            reviewing the risks and benefits, the patient was                            deemed in satisfactory condition to undergo  the                            procedure.                           After obtaining informed consent, the endoscope was                            passed under direct vision. Throughout the                            procedure, the patient's blood pressure, pulse, and                            oxygen saturations were monitored continuously. The                            [id number] ([id number]) Olympus enteroscope was                            introduced through the mouth and advanced to the                            small bowel at the Ligament of Treitz. The small                            bowel enteroscopy was accomplished without                            difficulty. The patient tolerated the procedure                            well. Scope In: Scope Out: Findings:      The examined esophagus was normal.      The stomach was entered. It contained a small bilious residual but no       blood or coffee ground material.      Localized hemorrhagic mucosa with apparent adjacent mucosal edema and       exudate with no active bleeding was found in a somewhat linear pattern       in the gastric fundus on the greater curvature aspect of the stomach.  Biopsies were taken with a cold forceps for histology. Estimated blood       loss was minimal.      The exam of the stomach was otherwise normal.      The cardia and gastric fundus were normal on retroflexion.      There was no evidence of significant pathology in the duodenal bulb, in       the second portion of the duodenum, in the third portion of the duodenum       and in the fourth portion of the duodenum. Impression:               - Normal esophagus.                           - Hemorrhagic gastropathy of uncertain relevance to                            the patient's recurrent bleeding. Biopsied. Unclear                            if it was simply scope trauma but the adjacent                            apparent mucosal inflammatory  changes make me think                            that it may be "real" rather than an artifact of                            the procedure.                           - Normal duodenal bulb, second portion of the                            duodenum, third portion of the duodenum and fourth                            portion of the duodenum. Recommendation:           - Await pathology results. If significant mucosal                            inflammation/erosion is confirmed to be present,                            that would make me think that this was a primary                            process that might account for the patient's                            recurrent bleeding.                           - Empircially, in view of  the above gastritis, use                            sucralfate tablets 1 gram PO QID for 2 months to                            see if this makes any difference in the patient's                            tendency for bleeding. Procedure Code(s):        --- Professional ---                           [phone], Small intestinal endoscopy, enteroscopy                            beyond second portion of duodenum, not including                            ileum; with biopsy, single or multiple Diagnosis Code(s):        --- Professional ---                           K92.2, Gastrointestinal hemorrhage, unspecified CPT copyright 2019 American Medical Association. All rights reserved. The codes documented in this report are preliminary and upon coder review may  be revised to meet current compliance requirements. Bernette Redbird, MD 05/19/2019 2:43:31 PM This report has been signed electronically. Number of Addenda: 0

## 2019-05-19 NOTE — Discharge Instructions (Signed)
Gastrointestinal Bleeding °Gastrointestinal (GI) bleeding is bleeding somewhere along the path that food travels through the body (digestive tract). This path is anywhere between the mouth and the opening of the butt (anus). You may have blood in your poop (stool) or have black poop. If you throw up (vomit), there may be blood in it. °This condition can be mild, serious, or even life-threatening. If you have a lot of bleeding, you may need to stay in the hospital. °What are the causes? °This condition may be caused by: °· Irritation and swelling of the esophagus (esophagitis). The esophagus is part of the body that moves food from your mouth to your stomach. °· Swollen veins in the butt (hemorrhoids). °· Areas of painful tearing in the opening of the butt (anal fissures). These are often caused by passing hard poop. °· Pouches that form on the colon over time (diverticulosis). °· Irritation and swelling (diverticulitis) in areas where pouches have formed on the colon. °· Growths (polyps) or cancer. Colon cancer often starts out as growths that are not cancer. °· Irritation of the stomach lining (gastritis). °· Sores (ulcers) in the stomach. °What increases the risk? °You are more likely to develop this condition if you: °· Have a certain type of infection in your stomach (Helicobacter pylori infection). °· Take certain medicines. °· Smoke. °· Drink alcohol. °What are the signs or symptoms? °Common symptoms of this condition include: °· Throwing up (vomiting) material that has bright red blood in it. It may look like coffee grounds. °· Changes in your poop. The poop may: °? Have red blood in it. °? Be black, look like tar, and smell stronger than normal. °? Be red. °· Pain or cramping in the belly (abdomen). °How is this treated? °Treatment for this condition depends on the cause of the bleeding. For example: °· Sometimes, the bleeding can be stopped during a procedure that is done to find the problem (endoscopy or  colonoscopy). °· Medicines can be used to: °? Help control irritation, swelling, or infection. °? Reduce acid in your stomach. °· Certain problems can be treated with: °? Creams. °? Medicines that are put in the butt (suppositories). °? Warm baths. °· Surgery is sometimes needed. °· If you lose a lot of blood, you may need a blood transfusion. °If bleeding is mild, you may be allowed to go home. If there is a lot of bleeding, you will need to stay in the hospital. °Follow these instructions at home: ° °· Take over-the-counter and prescription medicines only as told by your doctor. °· Eat foods that have a lot of fiber in them. These foods include beans, whole grains, and fresh fruits and vegetables. You can also try eating 1-3 prunes each day. °· Drink enough fluid to keep your pee (urine) pale yellow. °· Keep all follow-up visits as told by your doctor. This is important. °Contact a doctor if: °· Your symptoms do not get better. °Get help right away if: °· Your bleeding does not stop. °· You feel dizzy or you pass out (faint). °· You feel weak. °· You have very bad cramps in your back or belly. °· You pass large clumps of blood (clots) in your poop. °· Your symptoms are getting worse. °· You have chest pain or fast heartbeats. °Summary °· GI bleeding is bleeding somewhere along the path that food travels through the body (digestive tract). °· This bleeding can be caused by many things. Treatment depends on the cause of the bleeding. °·   Take medicines only as told by your doctor. °· Keep all follow-up visits as told by your doctor. This is important. °This information is not intended to replace advice given to you by your health care provider. Make sure you discuss any questions you have with your health care provider. °Document Released: 07/03/2008 Document Revised: 05/07/2018 Document Reviewed: 05/07/2018 °Elsevier Patient Education © 2020 [employer] ° °

## 2019-05-19 NOTE — Consult Note (Signed)
Referring Provider:  Dr. Hughie Closs Primary Care Physician:  Maurice Small, MD Primary Gastroenterologist:  Dr. Ewing Schlein  Reason for Consultation: Recurrent GI bleeding of obscure origin  HPI: Janet Williamson is a 78 y.o. female on chronic anticoagulation with Xarelto for atrial fibrillation, who was in the hospital last month with anemic symptoms and a recurrent low-grade GI bleed.    She had had a previous colonoscopy roughly 10 years earlier by Dr. Ewing Schlein that was negative.   She was in the hospital last December and underwent endoscopy that showed nonbleeding erosive gastropathy, but was otherwise negative, colonoscopy that was negative, and a video capsule endoscopy that was negative.  Anticoagulation was resumed and she had recurrent blood loss with anemia approximately 3 weeks ago, at which time endoscopic evaluation was negative.  She was admitted to the hospital, for the third time, last night with anemic symptoms (d.o.e.) and a one-day day history of frequent, loose dark stools (on iron), Hemoccult positive, minimal elevation of BUN at 19, and a post hydration hemoglobin of 7.3, as compared to a discharge hemoglobin of 7.5 on July 17.  She was admitted and transfused and her hemoglobin has remained stable since transfusion and her BUN has come down.   Past Medical History:  Diagnosis Date  . Anemia   . Cardiac mass    a. 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 Not seen on most recent TEE 2019.  . Cardiomyopathy in other disease   . Cataracts, bilateral   . Diabetes mellitus    Type 2  . Generalized osteoarthritis   . GERD (gastroesophageal reflux disease)   . HLD (hyperlipidemia)   . Hypertension   . Hypothyroidism   . LBBB (left bundle branch block)   . PAF (paroxysmal atrial fibrillation) (HCC)    a. s/p DCCV 06/2017, on Xarelto  . Phlebitis   . S/P TAVR (transcatheter aortic valve replacement) 04/15/2018   Edwards Sapien 3 THV (size 23 mm, model # [id number], serial  # [id number]) via the TF approach  . Severe aortic stenosis    a. s/p TAVR 04/2018.  . Stroke [facility]) 2008    Past Surgical History:  Procedure Laterality Date  . ABDOMINAL HYSTERECTOMY    . COLONOSCOPY    . COLONOSCOPY WITH PROPOFOL N/A 10/02/2018   Procedure: COLONOSCOPY WITH PROPOFOL;  Surgeon: [name], MD;  Location: [facility] ENDOSCOPY;  Service: Endoscopy;  Laterality: N/A;  . ESOPHAGOGASTRODUODENOSCOPY (EGD) WITH PROPOFOL N/A 10/02/2018   Procedure: ESOPHAGOGASTRODUODENOSCOPY (EGD) WITH PROPOFOL;  Surgeon: [name], MD;  Location: [facility] ENDOSCOPY;  Service: Endoscopy;  Laterality: N/A;  . ESOPHAGOGASTRODUODENOSCOPY (EGD) WITH PROPOFOL N/A 04/23/2019   Procedure: ESOPHAGOGASTRODUODENOSCOPY (EGD) WITH PROPOFOL;  Surgeon: Graylin Shiver, MD;  Location: [facility] ENDOSCOPY;  Service: Endoscopy;  Laterality: N/A;  . EYE SURGERY Bilateral    cataract removal  . GIVENS CAPSULE STUDY N/A 10/02/2018   Procedure: GIVENS CAPSULE STUDY;  Surgeon: [name], MD;  Location: [facility] ENDOSCOPY;  Service: Endoscopy;  Laterality: N/A;  . RIGHT/LEFT HEART CATH AND CORONARY ANGIOGRAPHY N/A 03/06/2018   Procedure: RIGHT/LEFT HEART CATH AND CORONARY ANGIOGRAPHY;  Surgeon: Lyn Records, MD;  Location: [facility] INVASIVE CV LAB;  Service: Cardiovascular;  Laterality: N/A;  . TEE WITHOUT CARDIOVERSION N/A 04/01/2018   Procedure: TRANSESOPHAGEAL ECHOCARDIOGRAM (TEE);  Surgeon: Jake Bathe, MD;  Location: [facility] ENDOSCOPY;  Service: Cardiovascular;  Laterality: N/A;  . TEE WITHOUT CARDIOVERSION N/A 04/15/2018   Procedure: TRANSESOPHAGEAL ECHOCARDIOGRAM (TEE);  Surgeon: Tonny Bollman, MD;  Location: [facility] OR;  Service: Open Heart Surgery;  Laterality: N/A;  . TOTAL KNEE ARTHROPLASTY Right 04/14/2013   Dr Marlou Sa  . TOTAL KNEE ARTHROPLASTY Right 04/14/2013   Procedure: TOTAL KNEE ARTHROPLASTY;  Surgeon: Meredith Pel, MD;  Location: [place];  Service: Orthopedics;  Laterality: Right;  . TRANSCATHETER AORTIC VALVE REPLACEMENT,  TRANSFEMORAL  04/15/2018  . TRANSCATHETER AORTIC VALVE REPLACEMENT, TRANSFEMORAL Bilateral 04/15/2018   Procedure: TRANSCATHETER AORTIC VALVE REPLACEMENT, TRANSFEMORAL;  Surgeon: Sherren Mocha, MD;  Location: [place];  Service: Open Heart Surgery;  Laterality: Bilateral;    Prior to Admission medications   Medication Sig Start Date End Date Taking? Authorizing Provider  acetaminophen (TYLENOL) 325 MG tablet Take 325-650 mg by mouth every 8 (eight) hours as needed (for headaches).    Yes [provider]  amLODipine (NORVASC) 5 MG tablet Take 5 mg by mouth daily.   Yes [provider]  Cholecalciferol (VITAMIN D3) 5000 units CAPS Take 5,000 Units by mouth daily.   Yes [provider]  ferrous sulfate 325 (65 FE) MG tablet Take 1 tablet (325 mg total) by mouth daily. 04/24/19 07/23/19 Yes Dessa Phi, DO  hydrochlorothiazide (HYDRODIURIL) 25 MG tablet Take 25 mg by mouth daily.    Yes [provider]  metFORMIN (GLUCOPHAGE-XR) 750 MG 24 hr tablet TAKE 2 TABLETS BY MOUTH ONCE DAILY WITH BREAKFAST Patient taking differently: Take 1,500 mg by mouth daily with breakfast.  05/04/19  Yes Elayne Snare, MD  metoprolol succinate (TOPROL-XL) 50 MG 24 hr tablet Take 50 mg by mouth daily.    Yes [provider]  pantoprazole (PROTONIX) 40 MG tablet Take 1 tablet (40 mg total) by mouth 2 (two) times daily. 04/24/19  Yes Dessa Phi, DO  Polyethyl Glycol-Propyl Glycol (SYSTANE OP) Place 1 drop into both eyes 2 (two) times daily.    Yes [provider]  simvastatin (ZOCOR) 20 MG tablet Take 20 mg by mouth every evening.   Yes [provider]  XARELTO 20 MG TABS tablet TAKE 1 TABLET BY MOUTH ONCE DAILY WITH SUPPER Patient taking differently: Take 20 mg by mouth daily with supper. High risk med: Anticoagulant.  Crushed Xarelto can be given down a G-tube but NOT a J-Tube. 05/15/19  Yes Jerline Pain, MD  glucose blood (ACCU-CHEK AVIVA) test strip Use as  instructed to check once daily DX: E11.9 07/02/18   Elayne Snare, MD    Current Facility-Administered Medications  Medication Dose Route Frequency Provider Last Rate Last Dose  . 0.9 %  sodium chloride infusion (Manually program via Guardrails IV Fluids)   Intravenous Once Truett Mainland, DO      . amLODipine (NORVASC) tablet 5 mg  5 mg Oral Daily Truett Mainland, DO   5 mg at 05/19/19 9937  . ferrous sulfate tablet 325 mg  325 mg Oral TID WC Truett Mainland, DO   325 mg at 05/19/19 1696  . hydrochlorothiazide (HYDRODIURIL) tablet 25 mg  25 mg Oral Daily Truett Mainland, DO   25 mg at 05/19/19 7893  . insulin aspart (novoLOG) injection 0-15 Units  0-15 Units Subcutaneous TID WC Truett Mainland, DO      . insulin aspart (novoLOG) injection 0-5 Units  0-5 Units Subcutaneous QHS Stinson, Jacob J, DO      . magnesium sulfate IVPB 2 g 50 mL  2 g Intravenous Once Darliss Cheney, MD      . metFORMIN (GLUCOPHAGE-XR) 24 hr tablet 1,500 mg  1,500 mg Oral Q  breakfast Levie HeritageStinson, Jacob J, DO   1,500 mg at 05/18/19 [id number]  . metoprolol succinate (TOPROL-XL) 24 hr tablet 50 mg  50 mg Oral Daily Levie HeritageStinson, Jacob J, DO   50 mg at 05/19/19 [id number]  . ondansetron (ZOFRAN) tablet 4 mg  4 mg Oral Q6H PRN Levie HeritageStinson, Jacob J, DO       Or  . ondansetron [facility](ZOFRAN) injection 4 mg  4 mg Intravenous Q6H PRN Levie HeritageStinson, Jacob J, DO      . pantoprazole (PROTONIX) injection 40 mg  40 mg Intravenous Q12H Levie HeritageStinson, Jacob J, DO   40 mg at 05/19/19 0344  . simvastatin (ZOCOR) tablet 20 mg  20 mg Oral QPM Levie HeritageStinson, Jacob J, DO   20 mg at 05/18/19 1804    Allergies as of 05/17/2019 - Review Complete 05/17/2019  Allergen Reaction Noted  . Ace inhibitors Swelling and Other (See Comments) 10/05/2013  . Keflex [cephalexin] Swelling and Other (See Comments) 10/05/2013  . Rifadin [rifampin] Swelling and Other (See Comments) 10/05/2013    Family History  Problem Relation Age of Onset  . Stroke Mother   . Hypertension Mother   . Hypertension  Father   . Heart attack Neg Hx     Social History   Socioeconomic History  . Marital status: Widowed    Spouse name: Not on file  . Number of children: Not on file  . Years of education: Not on file  . Highest education level: Not on file  Occupational History  . Not on file  Social Needs  . Financial resource strain: Not on file  . Food insecurity    Worry: Not on file    Inability: Not on file  . Transportation needs    Medical: Not on file    Non-medical: Not on file  Tobacco Use  . Smoking status: Never Smoker  . Smokeless tobacco: Never Used  Substance and Sexual Activity  . Alcohol use: No  . Drug use: No  . Sexual activity: Not Currently  Lifestyle  . Physical activity    Days per week: Not on file    Minutes per session: Not on file  . Stress: Not on file  Relationships  . Social Musicianconnections    Talks on phone: Not on file    Gets together: Not on file    Attends religious service: Not on file    Active member of club or organization: Not on file    Attends meetings of clubs or organizations: Not on file    Relationship status: Not on file  . Intimate partner violence    Fear of current or ex partner: Not on file    Emotionally abused: Not on file    Physically abused: Not on file    Forced sexual activity: Not on file  Other Topics Concern  . Not on file  Social History Narrative  . Not on file    Review of Systems: No chest pain or current shortness of breath, perhaps some mild leg swelling recently, no GI symptoms such as nausea, epigastric pain, or dysphagia.  No skin rashes or arthropathy symptoms.  Physical Exam: Vital signs in last 24 hours: Temp:  [97.9 F (36.6 C)-98.8 F (37.1 C)] 98.8 F (37.1 C) (08/10 2120) Pulse Rate:  [66-75] 66 (08/11 0800) Resp:  [16-22] 22 (08/10 2120) BP: (121-126)/(31-62) 121/60 (08/11 0800) SpO2:  [97 %-100 %] 100 % (08/11 0800) Last BM Date: 05/19/19 General:   Alert,  Well-developed, well-nourished,  pleasant  and cooperative in NAD, sitting in bedside chair Head:  Normocephalic and atraumatic. Eyes:  Sclera clear, no icterus.   Conjunctiva pink. Mouth:   No ulcerations or lesions.  Oropharynx pink & moist. Lungs:  Clear throughout to auscultation.   No wheezes, crackles, or rhonchi. No evident respiratory distress. Heart:   Regular rate and rhythm at this time; no murmurs, clicks, rubs,  or gallops. Abdomen:  Soft, nontender, and nondistended. d.   Rectal: Not performed, but showed heme positive stool at time of admission yesterday Msk:   Symmetrical without gross deformities. Pulses:  Normal radial pulse is noted. Extremities:   Without clubbing, cyanosis, or edema. Neurologic:  Alert and coherent;  grossly normal neurologically. Skin:  Intact without significant lesions or rashes. Psych:   Alert and cooperative. Normal mood and affect.  Intake/Output from previous day: 08/10 0701 - 08/11 0700 In: 960 [P.O.:960] Out: -  Intake/Output this shift: No intake/output data recorded.  Lab Results: Recent Labs    05/17/19 2159 05/18/19 0544 05/18/19 1701 05/19/19 0212  WBC 10.3 9.0  --  8.0  HGB 7.3* 8.2* 8.5* 8.5*  HCT 24.5* 27.4* 28.1* 28.4*  PLT 358 354  --  366   BMET Recent Labs    05/17/19 1341 05/18/19 0544 05/19/19 0212  NA 140 138 138  K 3.3* 3.6 3.4*  CL 103 101 101  CO2 26 28 25  GLUCOSE 132* 97 101*  BUN 19 13 7*  CREATININE 0.80 0.65 0.74  CALCIUM 9.8 9.2 9.3   LFT Recent Labs    05/17/19 1341  PROT 7.5  ALBUMIN 3.3*  AST 16  ALT 10  ALKPHOS 49  BILITOT 0.3   PT/INR No results for input(s): LABPROT, INR in the last 72 hours.  Studies/Results: Dg Chest Portable 1 View  Result Date: 05/17/2019 CLINICAL DATA:  78-year-old female with history of shortness of breath. EXAM: PORTABLE CHEST 1 VIEW COMPARISON:  Chest x-ray 04/21/2019. FINDINGS: Lung volumes are normal. No consolidative airspace disease. No pleural effusions. No pneumothorax. No  pulmonary nodule or mass noted. Pulmonary vasculature and the cardiomediastinal silhouette are within normal limits. Atherosclerosis in the thoracic aorta. Sapien type transcatheter aortic valve incidentally noted. IMPRESSION: 1.  No radiographic evidence of acute cardiopulmonary disease. 2. Aortic atherosclerosis. 3. Status post TAVR. Electronically Signed   By: Daniel  Entrikin M.D.   On: 05/17/2019 15:27    Impression: Recurrent low-grade GI bleeding while on chronic anticoagulation.  Plan:    Given the dark stools (albeit admittedly on iron) and the mild rise in hemoglobin, I think that it is worth reexamining the upper tract in close timing to her recent melenic stools.  I expressed to the patient the likelihood that this would not show any source of bleeding, but sometimes, we will get lucky and find an evanescent lesion.  I have discussed the case with the attending hospitalist physician, and he and I both feel that if the endoscopy is negative, it would be reasonable to send the patient home without further evaluation for the time being, and seeing if her hemoglobin remained stable as an outpatient.  If it does, in a week or 2, anticoagulation could be resumed, perhaps in a stepwise upward fashion.  If the patient has recurrent bleeding on anticoagulation, at that point it would probably be worth repeating all of her studies (upper endoscopy, colonoscopy, and video capsule endoscopy) and if those studies were again unrevealing for source of blood loss, cardiology might have to get   involved to consider alternative anticoagulation regimen, or perhaps taking the patient off anticoagulation entirely, although from discussion with Dr. Jacqulyn BathPahwani, the patient has a relatively high chads score so we would like to try to avoid that if possible.  I have reviewed the nature, purpose, and risks of upper endoscopy with the patient and explained to her that I intend to use an extended range scope to perhaps  increase our yield for proximal small bowel abnormalities (which might have been missed on the capsule endoscopy).  She is agreeable to proceed.   LOS: 0 days   Katy FitchRobert V Lexxi Koslow  05/19/2019, 11:38 AM   Pager [phone] If no answer or after 5 PM call [phone]

## 2019-05-20 LAB — TYPE AND SCREEN
ABO/RH(D): O POS
Antibody Screen: POSITIVE
Donor AG Type: NEGATIVE
Donor AG Type: NEGATIVE
Unit division: 0
Unit division: 0

## 2019-05-20 LAB — BPAM RBC
Blood Product Expiration Date: 202009022359
Blood Product Expiration Date: 202009032359
ISSUE DATE / TIME: 202008100010
Unit Type and Rh: 5100
Unit Type and Rh: 5100

## 2019-05-26 ENCOUNTER — Encounter: Payer: Self-pay | Admitting: Cardiology

## 2019-06-04 ENCOUNTER — Telehealth: Payer: Self-pay | Admitting: *Deleted

## 2019-06-04 NOTE — Telephone Encounter (Signed)
S/w pt went over all instructions for upcoming telehealth visit sept 1.

## 2019-06-05 NOTE — Progress Notes (Signed)
Telehealth Visit     Virtual Visit via Video Note   This visit type was conducted due to national recommendations for restrictions regarding the COVID-19 Pandemic (e.g. social distancing) in an effort to limit this patient's exposure and mitigate transmission in our community.  Due to her co-morbid illnesses, this patient is at least at moderate risk for complications without adequate follow up.  This format is felt to be most appropriate for this patient at this time.  All issues noted in this document were discussed and addressed.  A limited physical exam was performed with this format.  Please refer to the patient's chart for her consent to telehealth for [facility].   Evaluation Performed:  Follow-up visit  This visit type was conducted due to national recommendations for restrictions regarding the COVID-19 Pandemic (e.g. social distancing).  This format is felt to be most appropriate for this patient at this time.  All issues noted in this document were discussed and addressed.  No physical exam was performed (except for noted visual exam findings with Video Visits).  Please refer to the patient's chart (MyChart message for video visits and phone note for telephone visits) for the patient's consent to telehealth for [facility].  Date:  06/09/2019   ID:  Janet Williamson, DOB Aug 19, 1941, MRN [id number]  Patient Location:  Home  Provider location:   Home  PCP:  Kelton Pillar, MD  Cardiologist:  Servando Snare & [name], MD  Electrophysiologist:  None   Chief Complaint:  Follow up from Hospital  History of Present Illness:    Janet Williamson is a 78 y.o. female who presents via audio/video conferencing for a telehealth visit today.  Seen for Dr. Marlou Porch. Has seen Dr. Burt Knack for structural visit.   She has a history ofembolic CVA, MAC,LBBB,HTN, HLD, DMT2, PAF on Xarelto and severe AS s/p TAVR (04/15/18).   She was seen by Dr. Cyndia Bent in 2008 when she presented with an embolic  stroke and was found to have a mass on her mitral valve.It was unclear if this was a fibroblastoma or papilloma or possibly dystrophic calcification and we decided to follow it. She has been followed by Dr. Marlou Porch since that and has done well but over the past year has developed progressive shortness of breath and fatigue with activity.She had an echo on 02/26/2018 showing progression to severe aortic stenosis with a mean gradient of 47 mmHg and a peak gradient of 71 mmHg. The dimensionless index was 0.34. Left ventricular ejection fraction was 55 to 60%.She underwent cardiac catheterization on 03/06/2018 which showed no significant coronary disease. The peak to peak gradient across the aortic valve was 32 mmHg. She was evaluated by Dr. Burt Knack and a repeat TEE was performed to evaluate the mitral valve given her history of a mitral valve mass. This showed no evidence of mitral valve mass or vegetation. There is mild regurgitation. The leaflets were mildly thickened with mild mitral annular calcification. The aortic valve was severely calcified and restricted with a mean gradient of 36 mmHg.   She underwentsuccessful TAVR with a76m Edwards Sapien 3 THV via the TF approach on 04/15/18. Post operative echo showed higher than expected transvalvular aortic gradients likely related to patient-prosthesis mismatch with a mean gradient of 22 mmHgwithno paravalvular regurgitation. She was discharged the following day on Asprin 81 mg daily and home Xarelto .1 month echo showed EF 55% with normally functioning TAVR with no PVL and mean gradient 226mHg.  She was admitted in  09/2018 for GI bleed. Hg 5.2 and she was transfused. Colonoscopy was normal. EGD showed non bleeding erosive gastropathy. Capsule endoscopy showed duodenal ulcer. She was started on a PPI and aspirin discontinued. Follow up CBC showed Hg 9.6.  She was last seen in June by Bonney Leitz, PA for a telehealth visit - some mild dizziness noted  but otherwise, seemed to be ok.   Admitted last month with recurrent GI bleed. Noted she was admitted in July for dyspnea and melena - no source of bleeding identified and her Xarelto was resumed. This time - she had repeat EGD and possible proximal hemorrhagic gastritis but no ulcer. She was maintained on PPI and Carafate. Xarelto was to be held for one week - she was then to see her PCP and if hemoglobin remained stable, and see if "she could be challenged with a smaller dose of Xarelto".   The patient does not have symptoms concerning for COVID-19 infection (fever, chills, cough, or new shortness of breath).   Seen today via a telephone conversation. She does not use My Chart. No Smartphone. She has consented for this visit. She is feeling better. Has more energy. No more bleeding. She remains off her Xarelto. She has had labs with Dr. Laurann Montana and says her count is now up to 9. Apparently the question of the Xarelto was going to be deferred to [place] per her. No chest pain. Breathing is ok. No palpitations reported. She notes that she can tell when she has atrial fibrillation or not - typically she will get very fatigued. Right now, she feels like she is doing ok.   Past Medical History:  Diagnosis Date  . Acute lower GI bleeding 05/2019  . Anemia   . Cardiac mass    a. 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 Not seen on most recent TEE 2019.  . Cardiomyopathy in other disease   . Cataracts, bilateral   . Diabetes mellitus    Type 2  . Generalized osteoarthritis   . GERD (gastroesophageal reflux disease)   . HLD (hyperlipidemia)   . Hypertension   . Hypothyroidism   . LBBB (left bundle branch block)   . PAF (paroxysmal atrial fibrillation) ([place])    a. s/p DCCV 06/2017, on Xarelto  . Phlebitis   . S/P TAVR (transcatheter aortic valve replacement) 04/15/2018   Edwards Sapien 3 THV (size 23 mm, model # 9600TFX, serial # [id number]) via the TF approach  . Severe aortic stenosis    a. s/p TAVR  04/2018.  . Stroke [facility]) 2008   Past Surgical History:  Procedure Laterality Date  . ABDOMINAL HYSTERECTOMY    . BIOPSY  05/19/2019   Procedure: BIOPSY;  Surgeon: Ronald Lobo, MD;  Location: [place];  Service: Endoscopy;;  . COLONOSCOPY    . COLONOSCOPY WITH PROPOFOL N/A 10/02/2018   Procedure: COLONOSCOPY WITH PROPOFOL;  Surgeon: Arta Silence, MD;  Location: [place];  Service: Endoscopy;  Laterality: N/A;  . ENTEROSCOPY N/A 05/19/2019   Procedure: ENTEROSCOPY;  Surgeon: Ronald Lobo, MD;  Location: [facility] ENDOSCOPY;  Service: Endoscopy;  Laterality: N/A;  . ESOPHAGOGASTRODUODENOSCOPY (EGD) WITH PROPOFOL N/A 10/02/2018   Procedure: ESOPHAGOGASTRODUODENOSCOPY (EGD) WITH PROPOFOL;  Surgeon: Arta Silence, MD;  Location: [place];  Service: Endoscopy;  Laterality: N/A;  . ESOPHAGOGASTRODUODENOSCOPY (EGD) WITH PROPOFOL N/A 04/23/2019   Procedure: ESOPHAGOGASTRODUODENOSCOPY (EGD) WITH PROPOFOL;  Surgeon: Wonda Horner, MD;  [facility]COPY;  Service: Endoscopy;  Laterality: N/A;  . EYE SURGERY Bilateral    cataract  removal  . GIVENS CAPSULE STUDY N/A 10/02/2018   Procedure: GIVENS CAPSULE STUDY;  Surgeon: Arta Silence, MD;  Location: [facility] ENDOSCOPY;  Service: Endoscopy;  Laterality: N/A;  . RIGHT/LEFT HEART CATH AND CORONARY ANGIOGRAPHY N/A 03/06/2018   Procedure: RIGHT/LEFT HEART CATH AND CORONARY ANGIOGRAPHY;  Surgeon: Belva Crome, MD;  Location: [place] CV LAB;  Service: Cardiovascular;  Laterality: N/A;  . SMALL BOWEL ENTEROSCOPY  05/19/2019  . TEE WITHOUT CARDIOVERSION N/A 04/01/2018   Procedure: TRANSESOPHAGEAL ECHOCARDIOGRAM (TEE);  Surgeon: Jerline Pain, MD;  Location: [facility] ENDOSCOPY;  Service: Cardiovascular;  Laterality: N/A;  . TEE WITHOUT CARDIOVERSION N/A 04/15/2018   Procedure: TRANSESOPHAGEAL ECHOCARDIOGRAM (TEE);  Surgeon: Sherren Mocha, MD;  Location: [place];  Service: Open Heart Surgery;  Laterality: N/A;  . TOTAL KNEE ARTHROPLASTY Right  04/14/2013   Dr Marlou Sa  . TOTAL KNEE ARTHROPLASTY Right 04/14/2013   Procedure: TOTAL KNEE ARTHROPLASTY;  Surgeon: Meredith Pel, MD;  Location: [place];  Service: Orthopedics;  Laterality: Right;  . TRANSCATHETER AORTIC VALVE REPLACEMENT, TRANSFEMORAL  04/15/2018  . TRANSCATHETER AORTIC VALVE REPLACEMENT, TRANSFEMORAL Bilateral 04/15/2018   Procedure: TRANSCATHETER AORTIC VALVE REPLACEMENT, TRANSFEMORAL;  Surgeon: Sherren Mocha, MD;  Location: [place];  Service: Open Heart Surgery;  Laterality: Bilateral;     Current Meds  Medication Sig  . acetaminophen (TYLENOL) 325 MG tablet Take 325-650 mg by mouth every 8 (eight) hours as needed (for headaches).   Marland Kitchen amLODipine (NORVASC) 5 MG tablet Take 5 mg by mouth daily.  . Cholecalciferol (VITAMIN D3) 5000 units CAPS Take 5,000 Units by mouth daily.  . ferrous sulfate 325 (65 FE) MG tablet Take 1 tablet (325 mg total) by mouth daily.  Marland Kitchen glucose blood (ACCU-CHEK AVIVA) test strip Use as instructed to check once daily DX: E11.9  . hydrochlorothiazide (HYDRODIURIL) 25 MG tablet Take 25 mg by mouth daily.   . metFORMIN (GLUCOPHAGE-XR) 750 MG 24 hr tablet Take 1,500 mg by mouth daily with breakfast.  . metoprolol succinate (TOPROL-XL) 50 MG 24 hr tablet Take 50 mg by mouth daily.   . pantoprazole (PROTONIX) 40 MG tablet Take 1 tablet (40 mg total) by mouth 2 (two) times daily.  Vladimir Faster Glycol-Propyl Glycol (SYSTANE OP) Place 1 drop into both eyes 2 (two) times daily.   . simvastatin (ZOCOR) 20 MG tablet Take 20 mg by mouth every evening.  . [DISCONTINUED] metFORMIN (GLUCOPHAGE-XR) 750 MG 24 hr tablet TAKE 2 TABLETS BY MOUTH ONCE DAILY WITH BREAKFAST (Patient taking differently: Take 1,500 mg by mouth daily with breakfast. )     Allergies:   Ace inhibitors, Keflex [cephalexin], and Rifadin [rifampin]   Social History   Tobacco Use  . Smoking status: Never Smoker  . Smokeless tobacco: Never Used  Substance Use Topics  . Alcohol use: No  . Drug  use: No     Family Hx: The patient's family history includes Hypertension in her father and mother; Stroke in her mother. There is no history of Heart attack.  ROS:   Please see the history of present illness.   All other systems reviewed are negative.    Objective:    Vital Signs:  BP (!) 143/74   Pulse 69   Ht _0  (1.575 m)   Wt 188 lb 12.8 oz (85.6 kg)   BMI 34.53 kg/m    Wt Readings from Last 3 Encounters:  06/09/19 188 lb 12.8 oz (85.6 kg)  05/17/19 180 lb (81.6 kg)  05/01/19 192 lb 9.6  oz (87.4 kg)    Alert female in no acute distress.   Labs/Other Tests and Data Reviewed:    Lab Results  Component Value Date   WBC 8.0 05/19/2019   HGB 8.5 (L) 05/19/2019   HCT 28.4 (L) 05/19/2019   PLT 366 05/19/2019   GLUCOSE 101 (H) 05/19/2019   CHOL 133 10/27/2018   TRIG 73.0 10/27/2018   HDL 39.60 10/27/2018   LDLCALC 79 10/27/2018   ALT 10 05/17/2019   AST 16 05/17/2019   NA 138 05/19/2019   K 3.4 (L) 05/19/2019   CL 101 05/19/2019   CREATININE 0.74 05/19/2019   BUN 7 (L) 05/19/2019   CO2 25 05/19/2019   TSH 1.09 04/24/2018   INR 1.5 (H) 04/21/2019   HGBA1C 4.7 (L) 05/17/2019   MICROALBUR 0.9 10/30/2018     BNP (last 3 results) Recent Labs    09/29/18 2148  BNP 154.7*    ProBNP (last 3 results) No results for input(s): PROBNP in the last 8760 hours.    Prior CV studies:    The following studies were reviewed today:  2D ECHO 05/21/18 ( 1 month s/p TAVR) Study Conclusions - Left ventricle: The cavity size was normal. There was severe focal basal hypertrophy of the septum. Systolic function was normal. The estimated ejection fraction was in the range of 55% to 60%. Wall motion was normal; there were no regional wall motion abnormalities. Doppler parameters are consistent with abnormal left ventricular relaxation (grade 1 diastolic dysfunction). - Aortic valve: TAVR with a 23 mm Edwards Sapien 3 THV via the TF approach on 04/15/18.  Peak velocity (S): 324 cm/s. Mean gradient (S): 24 mm Hg. Valve area (Vmax): 1.11 cm^2. - Mitral valve: Moderately calcified annulus. - Left atrium: The atrium was mildly dilated. - Tricuspid valve: There was moderate regurgitation. - Pulmonic valve: There was moderate regurgitation. - Pulmonary arteries: Systolic pressure was mildly increased. PA peak pressure: 43 mm Hg (S).   _______________   TAVR OPERATIVE NOTE 04/15/2018  Procedure:   Transcatheter Aortic Valve Replacement - Percutaneous Transfemoral Approach Edwards Sapien 3 THV (size 43m, model # 9600TFX, serial # [id number]  Co-Surgeons:Clarence H. ORoxy Manns MD and MSherren Mocha MD  Pre-operative Echo Findings: ? Severe aortic stenosis ? Normalleft ventricular systolic function  Post-operative Echo Findings: ? Noparavalvular leak ? Normal/unchangedleft ventricular systolic function  ___________________    ASSESSMENT & PLAN:    1.  Recurrent Gi bleeding - repeat EGD with possible proximal hemorrhagic gastritis but no ulcer now noted - she remains off Xarelto. I do not think a "smaller dose" will give her the same protection since she is on this for AF and stroke prevention. Would hold on restarting for now - need to see her blood count normalize. Will get her back for discussion. It may be the risk of bleeding now outweighs the stroke prevention benefit. Will ask Dr. SMarlou Porchto review as well.   2. Prior severe AS - s/p TAVR from 2019 - last echo from June of 2020. On SBE.   3. PAF - was in sinus by last EKG - no reports of palpitations.   4. HTN - BP up a little - would follow for now.   5. HLD - on statin therapy  6. Recurrent anemia - most likely due to prior GI bleeding - she has had 3 recent events - see #1.   7. PAF - remains in sinus by her report and was in sinus by most recent EKG -  unfortunately her risk of stroke is increased. Risk of bleeding  currently felt to be greater.   8. COVID-19 Education: The signs and symptoms of COVID-19 were discussed with the patient and how to seek care for testing (follow up with PCP or arrange E-visit).  The importance of social distancing, staying at home, hand hygiene and wearing a mask when out in public were discussed today.  Patient Risk:   After full review of this patient's clinical status, I feel that they are at least moderate risk at this time.  Time:   Today, I have spent 8 minutes with the patient with telehealth technology discussing the above issues.     Medication Adjustments/Labs and Tests Ordered: Current medicines are reviewed at length with the patient today.  Concerns regarding medicines are outlined above.   Tests Ordered: No orders of the defined types were placed in this encounter.   Medication Changes: No orders of the defined types were placed in this encounter.   Disposition:  FU with [place] in one month. Will need lab on return visit.     Patient is agreeable to this plan and will call if any problems develop in the interim.   Amie Critchley, NP  06/09/2019 1:28 PM    [facility]

## 2019-06-09 ENCOUNTER — Telehealth: Payer: Self-pay | Admitting: *Deleted

## 2019-06-09 ENCOUNTER — Other Ambulatory Visit: Payer: Self-pay

## 2019-06-09 ENCOUNTER — Encounter: Payer: Self-pay | Admitting: Nurse Practitioner

## 2019-06-09 ENCOUNTER — Telehealth (INDEPENDENT_AMBULATORY_CARE_PROVIDER_SITE_OTHER): Payer: Medicare Other | Admitting: Nurse Practitioner

## 2019-06-09 VITALS — BP 143/74 | HR 69 | Ht 62.0 in | Wt 188.8 lb

## 2019-06-09 DIAGNOSIS — I10 Essential (primary) hypertension: Secondary | ICD-10-CM

## 2019-06-09 DIAGNOSIS — E785 Hyperlipidemia, unspecified: Secondary | ICD-10-CM

## 2019-06-09 DIAGNOSIS — I480 Paroxysmal atrial fibrillation: Secondary | ICD-10-CM | POA: Diagnosis not present

## 2019-06-09 DIAGNOSIS — Z952 Presence of prosthetic heart valve: Secondary | ICD-10-CM

## 2019-06-09 DIAGNOSIS — Z8719 Personal history of other diseases of the digestive system: Secondary | ICD-10-CM

## 2019-06-09 DIAGNOSIS — D649 Anemia, unspecified: Secondary | ICD-10-CM

## 2019-06-09 DIAGNOSIS — Z7189 Other specified counseling: Secondary | ICD-10-CM

## 2019-06-09 NOTE — Telephone Encounter (Signed)
Called pt to set up 1 month f/u with Dr. Marlou Porch in office.  Pt appt is Oct 15.

## 2019-06-09 NOTE — Patient Instructions (Addendum)
After Visit Summary:  We will be checking the following labs today - NONE  We will plan on doing lab when we see you for your visit.    Medication Instructions:    Continue with your current medicines.   We will continue to hold the Xarelto for now.    If you need a refill on your cardiac medications before your next appointment, please call your pharmacy.     Testing/Procedures To Be Arranged:  N/A  Follow-Up:   See Dr. Marlou Porch in one month with repeat lab and EKG for discussion about resuming Xarelto.     At [facility], you and your health needs are our priority.  As part of our continuing mission to provide you with exceptional heart care, we have created designated Provider Care Teams.  These Care Teams include your primary Cardiologist (physician) and Advanced Practice Providers (APPs -  Physician Assistants and Nurse Practitioners) who all work together to provide you with the care you need, when you need it.  Special Instructions:  . Stay safe, stay home, wash your hands for at least 20 seconds and wear a mask when out in public.  . It was good to talk with you today.    Call the [place] office at [phone] if you have any questions, problems or concerns.

## 2019-06-10 NOTE — Progress Notes (Signed)
S/w pt will call Dr.Buccini to set up appt per Dr.Skains and Dr.Buccini.

## 2019-06-10 NOTE — Progress Notes (Signed)
Can you please call and see if she has upcoming OV with Dr. Cristina Gong.   Dr. Marlou Porch would like for her to be seen to discuss the anticoagulation. See his note.   Burtis Junes, RN, [place] [address] [place], [place]  [id number] [phone]

## 2019-06-10 NOTE — Progress Notes (Signed)
Janet Williamson,  In review of hospital notes, recurrent bleeds, risks of restarting anticoagulation seem to outweigh benefits as you stated. Agree that lower dose of Xarelto would not provide adequate stroke protection.  She is aware that without anticoagulation, she is at risk for stroke. Ideally, we would restart Xarelto, but this has clearly failed in the past.  In addition to her visit with me, can you have her get back in with Dr. Cristina Gong with GI to discuss as well.   If he now thinks it is reasonable to use anticoagulation, we will restart. The GI bleeding is the rate limiting issue.   Thanks.   [name], MD

## 2019-07-20 ENCOUNTER — Other Ambulatory Visit: Payer: Self-pay

## 2019-07-20 IMAGING — CR CHEST - 2 VIEW
2 series · 2 of 2 positions shown · non-contrast
Comparison: PA and lateral chest 09/29/2018 and 07/06/2017.

CLINICAL DATA: Shortness of breath for 5 days.

EXAM:
CHEST - 2 VIEW

[chest pa]
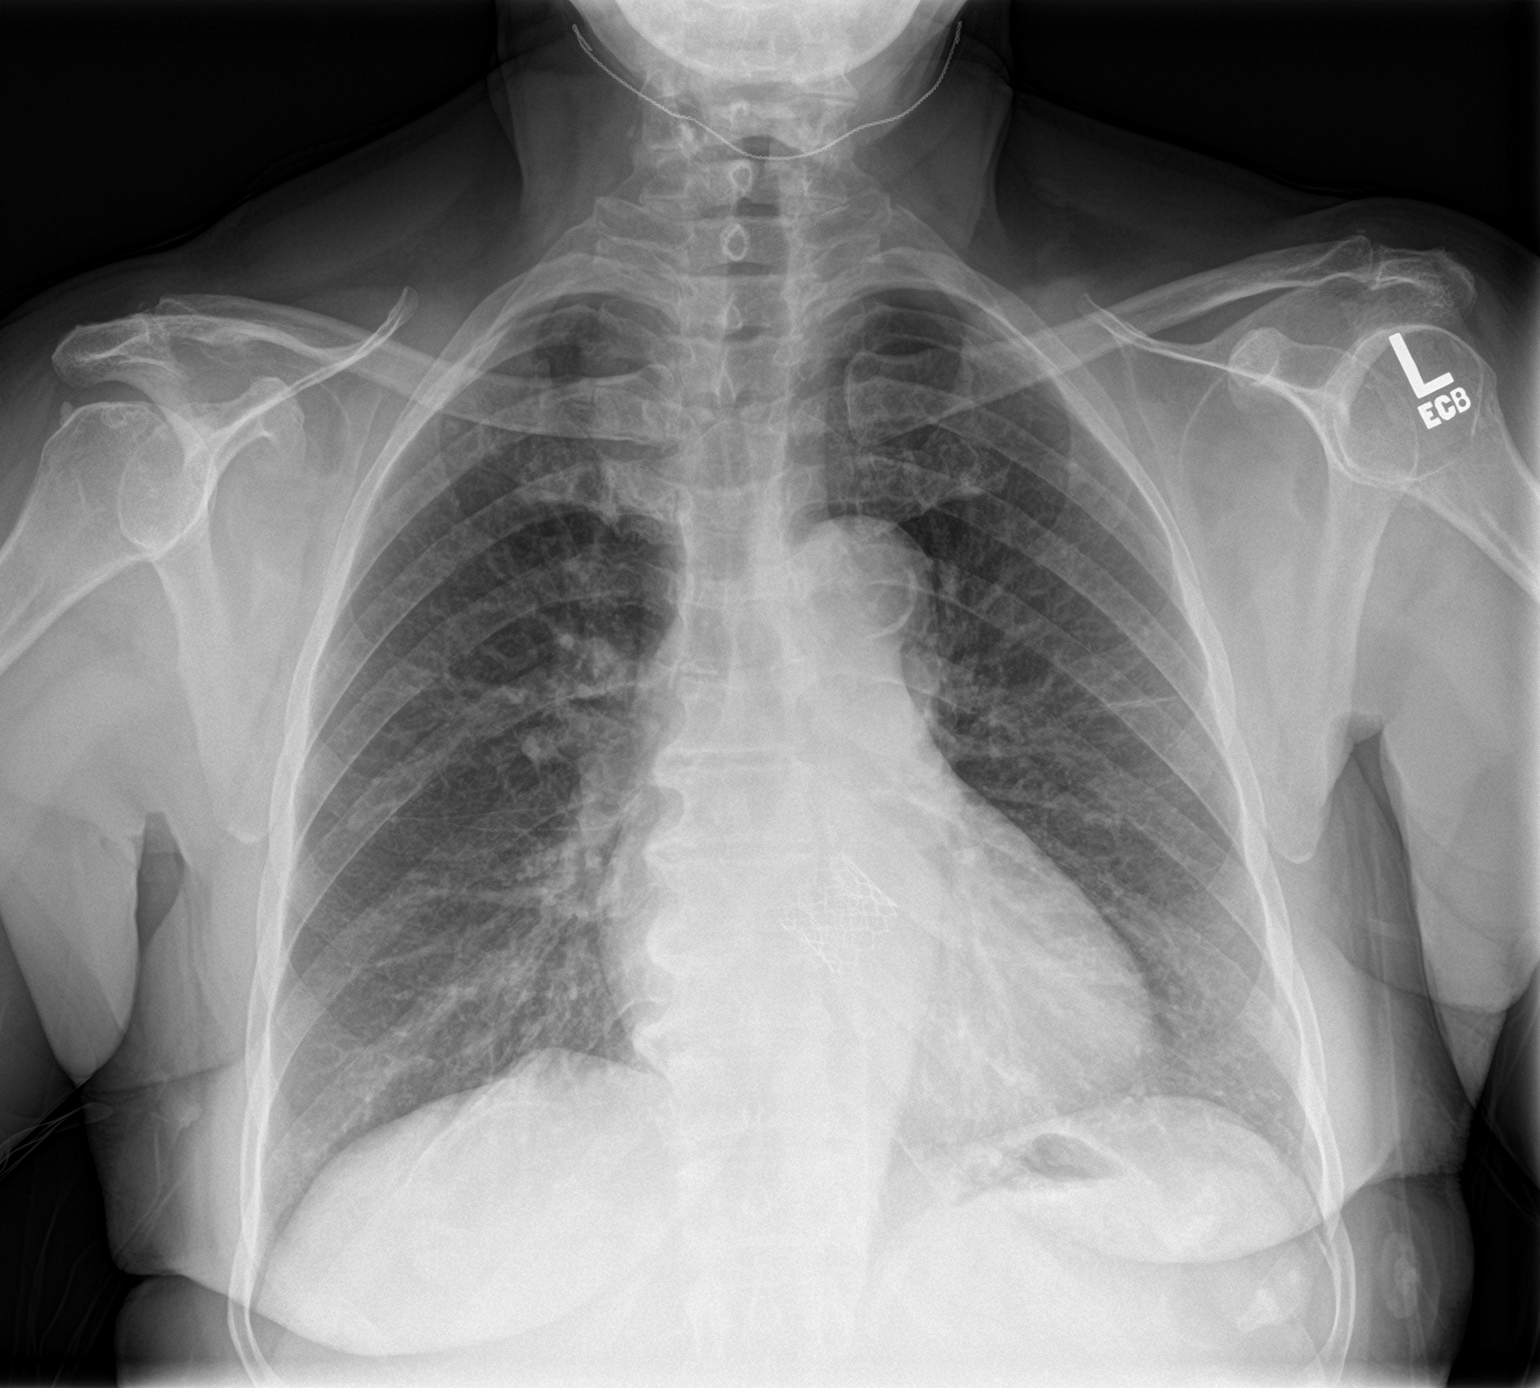

[chest lat]
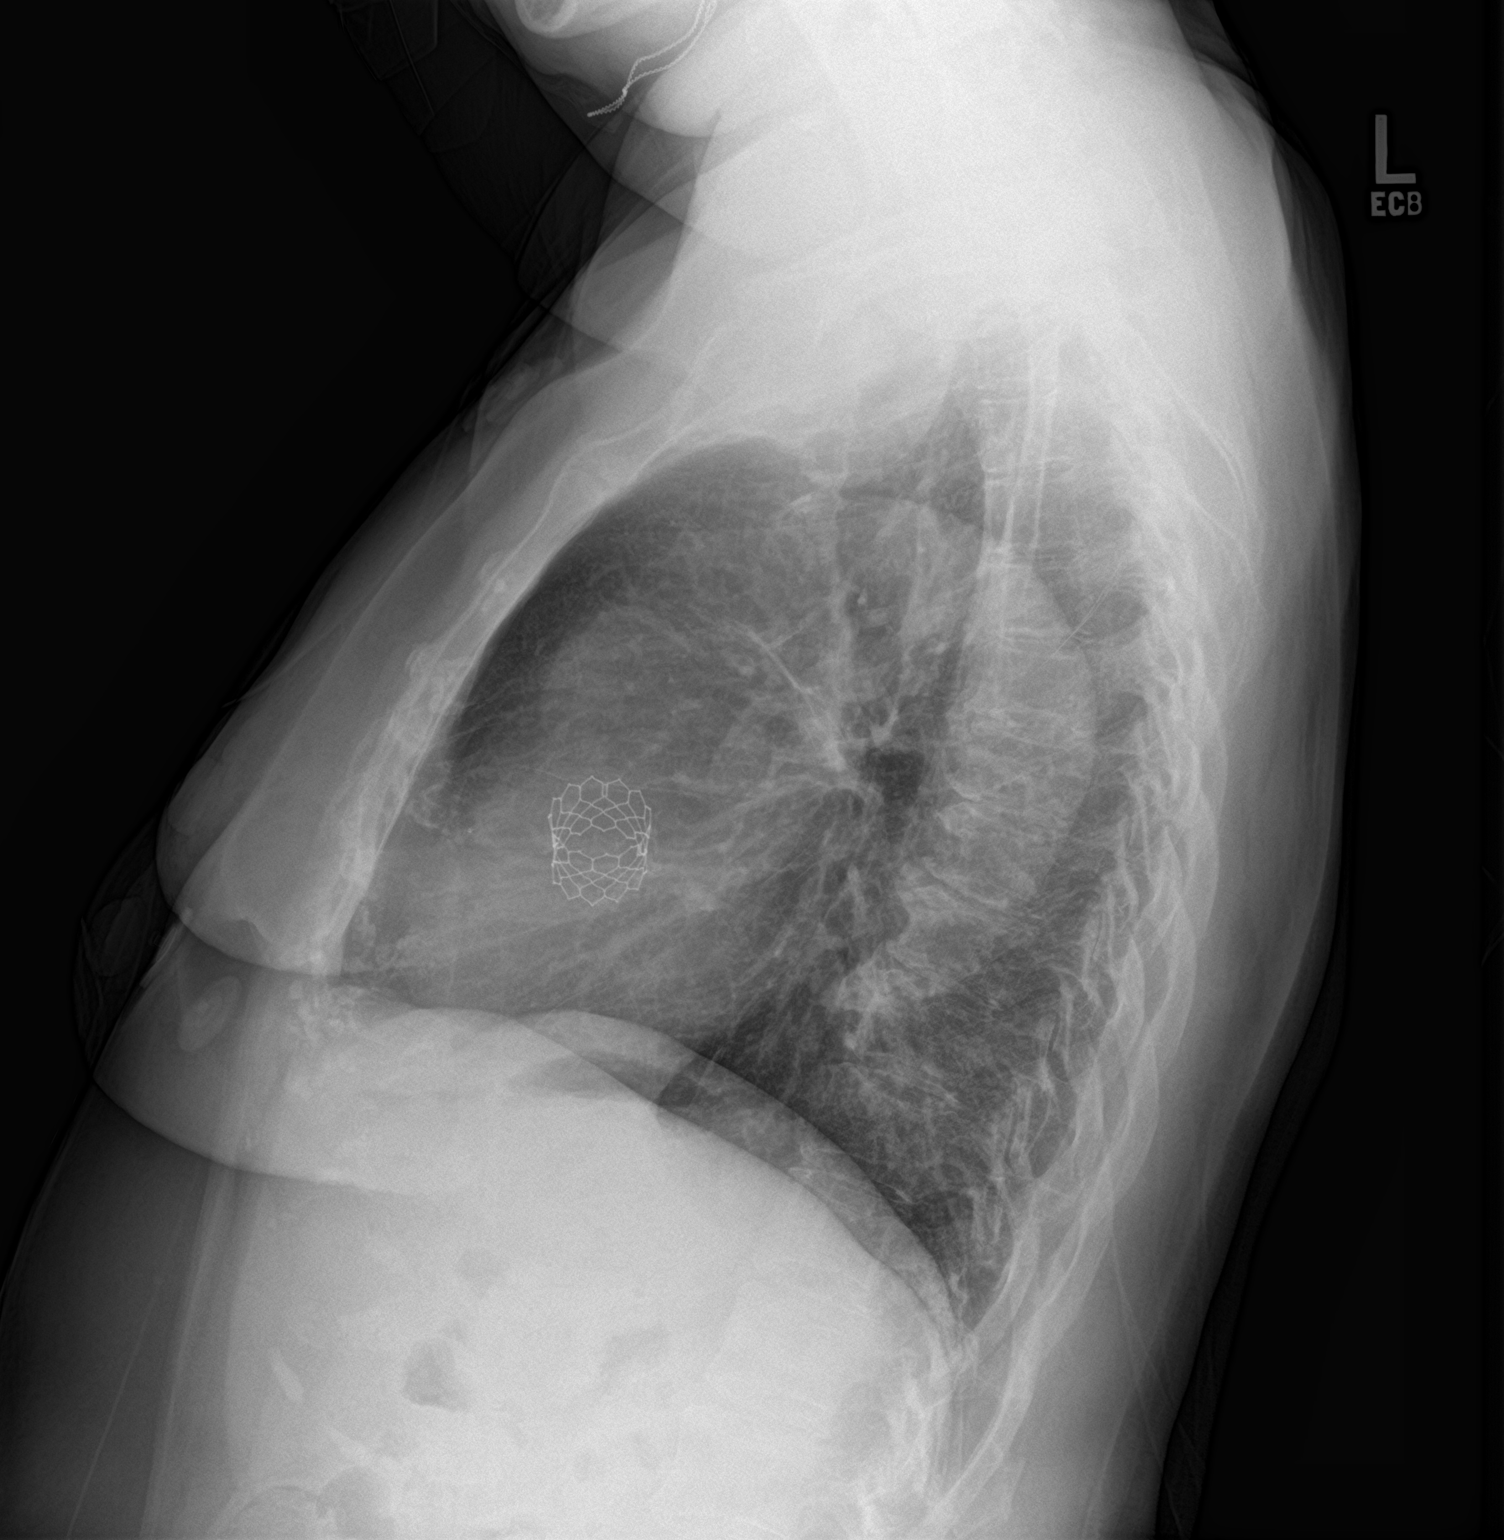

[2 of 2 positions shown; findings below may reference images not displayed]

FINDINGS: The patient is status post aortic valve replacement. Heart size is
normal. Aortic atherosclerosis is noted. Lungs are clear. No
pneumothorax or pleural effusion. No acute or focal bony
abnormality.
IMPRESSION: No acute disease.

Atherosclerosis.

## 2019-07-20 MED ORDER — GLUCOSE BLOOD VI STRP: ORAL_STRIP | 3 refills | Status: DC

## 2019-07-21 ENCOUNTER — Other Ambulatory Visit: Payer: Self-pay

## 2019-07-21 MED ORDER — GLUCOSE BLOOD VI STRP: ORAL_STRIP | 2 refills | Status: DC

## 2019-07-23 ENCOUNTER — Ambulatory Visit (INDEPENDENT_AMBULATORY_CARE_PROVIDER_SITE_OTHER): Payer: Medicare Other | Admitting: Cardiology

## 2019-07-23 ENCOUNTER — Encounter: Payer: Self-pay | Admitting: Cardiology

## 2019-07-23 ENCOUNTER — Other Ambulatory Visit: Payer: Self-pay

## 2019-07-23 VITALS — BP 140/60 | HR 78 | Ht 62.0 in | Wt 190.0 lb

## 2019-07-23 DIAGNOSIS — I480 Paroxysmal atrial fibrillation: Secondary | ICD-10-CM | POA: Diagnosis not present

## 2019-07-23 DIAGNOSIS — Z8719 Personal history of other diseases of the digestive system: Secondary | ICD-10-CM

## 2019-07-23 DIAGNOSIS — Z952 Presence of prosthetic heart valve: Secondary | ICD-10-CM | POA: Diagnosis not present

## 2019-07-23 DIAGNOSIS — I447 Left bundle-branch block, unspecified: Secondary | ICD-10-CM | POA: Diagnosis not present

## 2019-07-23 DIAGNOSIS — R42 Dizziness and giddiness: Secondary | ICD-10-CM

## 2019-07-23 NOTE — Progress Notes (Signed)
Cardiology Office Note:    Date:  07/23/2019   ID:  Janet Williamson, DOB 1941-07-09, MRN [id number]  PCP:  Janet Pillar, MD  Cardiologist:  Janet Furbish, MD  Electrophysiologist:  None   Referring MD: Janet Pillar, MD     History of Present Illness:    Janet Williamson is a 78 y.o. female here for follow-up TAVR aortic valve, history of embolic CVA MAC LBBB hypertension hyperlipidemia diabetes paroxysmal atrial fibrillation on Xarelto.  She underwentsuccessful TAVR with a2m Edwards Sapien 3 THV via the TF approach on 04/15/18. Post operative echo showed higher than expected transvalvular aortic gradients likely related to patient-prosthesis mismatch with a mean gradient of 22 mmHgwithno paravalvular regurgitation. She was discharged the following day on Asprin 81 mg daily and home Xarelto ."  07/23/2019-here for follow-up of TAVR valve.  Unfortunately while being on the Xarelto, she developed a recurrent GI bleed.  Xarelto had to be stopped. Overall she has been doing fairly well.  Had a telemedicine visit on 06/09/2019 with Janet Williamson  No dizziness post TAVR but overall was doing well.  She was admitted with GI bleed, Xarelto, hemoglobin was improved.  Her risk of bleeding is felt to be greater than her risk of stroke at this point.  We are unfortunately unable to utilize anticoagulation.  A smaller dose of Xarelto does not provide the protection that she needs.  Last Friday felt a dizzy spell, held on to wall.  Remember, she did wear a monitor which showed no adverse arrhythmias previously.  No fever chills cough  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 Not seen on most recent TEE 2019.   Cardiomyopathy in other disease    Cataracts, bilateral    Diabetes mellitus    Type 2   Generalized osteoarthritis    GERD (gastroesophageal reflux disease)    HLD  (hyperlipidemia)    Hypertension    Hypothyroidism    LBBB (left bundle branch block)    [place]    a. s/p DCCV 06/2017, on Xarelto   Phlebitis    S/P TAVR (transcatheter aortic valve replacement) 04/15/2018   Edwards Sapien 3 THV (size 23 mm, model # 9600TFX, serial # [id number] via the TF approach   Severe aortic stenosis    a. s/p TAVR 04/2018.   Stroke ([facility] 2008    Past Surgical History:  Procedure Laterality Date   ABDOMINAL HYSTERECTOMY     BIOPSY  05/19/2019   Procedure: BIOPSY;  Surgeon: BRonald Lobo MD;  [place]  Service: Endoscopy;;   COLONOSCOPY     COLONOSCOPY WITH PROPOFOL N/A 10/02/2018   Procedure: COLONOSCOPY WITH PROPOFOL;  Surgeon: OArta Silence MD;  [place]  Service: Endoscopy;  Laterality: N/A;   ENTEROSCOPY N/A 05/19/2019   Procedure: ENTEROSCOPY;  Surgeon: BRonald Lobo MD;  [place] [facility]  Service: Endoscopy;  Laterality: N/A;   ESOPHAGOGASTRODUODENOSCOPY (EGD) WITH PROPOFOL N/A 10/02/2018   Procedure: ESOPHAGOGASTRODUODENOSCOPY (EGD) WITH PROPOFOL;  Surgeon: OArta Silence MD;  [place]  Service: Endoscopy;  Laterality: N/A;   ESOPHAGOGASTRODUODENOSCOPY (EGD) WITH PROPOFOL N/A 04/23/2019   Procedure: ESOPHAGOGASTRODUODENOSCOPY (EGD) WITH PROPOFOL;  Surgeon: GWonda Horner MD;  [facility]  Service: Endoscopy;  Laterality: N/A;   EYE SURGERY Bilateral    cataract removal   GIVENS CAPSULE STUDY N/A 10/02/2018  Procedure: GIVENS CAPSULE STUDY;  Surgeon: Janet Silence, MD;  Location: [facility] ENDOSCOPY;  Service: Endoscopy;  Laterality: N/A;   RIGHT/LEFT HEART CATH AND CORONARY ANGIOGRAPHY N/A 03/06/2018   Procedure: RIGHT/LEFT HEART CATH AND CORONARY ANGIOGRAPHY;  Surgeon: Janet Crome, MD;  Location: [place] [facility];  Service: Cardiovascular;  Laterality: N/A;   SMALL BOWEL ENTEROSCOPY  05/19/2019   TEE WITHOUT CARDIOVERSION N/A  04/01/2018   Procedure: TRANSESOPHAGEAL ECHOCARDIOGRAM (TEE);  Surgeon: Janet Pain, MD;  Location: [facility] ENDOSCOPY;  Service: Cardiovascular;  Laterality: N/A;   TEE WITHOUT CARDIOVERSION N/A 04/15/2018   Procedure: TRANSESOPHAGEAL ECHOCARDIOGRAM (TEE);  Surgeon: Janet Mocha, MD;  Location: [place];  Service: Open Heart Surgery;  Laterality: N/A;   TOTAL KNEE ARTHROPLASTY Right 04/14/2013   Dr Janet Williamson   TOTAL KNEE ARTHROPLASTY Right 04/14/2013   Procedure: TOTAL KNEE ARTHROPLASTY;  Surgeon: Janet Pel, MD;  Location: [place];  Service: Orthopedics;  Laterality: Right;   TRANSCATHETER AORTIC VALVE REPLACEMENT, TRANSFEMORAL  04/15/2018   TRANSCATHETER AORTIC VALVE REPLACEMENT, TRANSFEMORAL Bilateral 04/15/2018   Procedure: TRANSCATHETER AORTIC VALVE REPLACEMENT, TRANSFEMORAL;  Surgeon: Janet Mocha, MD;  Location: [place];  Service: Open Heart Surgery;  Laterality: Bilateral;    Current Medications: Current Meds  Medication Sig   acetaminophen (TYLENOL) 325 MG tablet Take 325-650 mg by mouth every 8 (eight) hours as needed (for headaches).    amLODipine (NORVASC) 5 MG tablet Take 5 mg by mouth daily.   Cholecalciferol (VITAMIN D3) 5000 units CAPS Take 5,000 Units by mouth daily.   ferrous sulfate 325 (65 FE) MG tablet Take 1 tablet (325 mg total) by mouth daily.   glucose blood test strip Use Accu Chek aviva plus test strips as instructed to check blood sugar once daily. DX:E11.9   hydrochlorothiazide (HYDRODIURIL) 25 MG tablet Take 25 mg by mouth daily.    metFORMIN (GLUCOPHAGE-XR) 750 MG 24 hr tablet Take 1,500 mg by mouth daily with breakfast.   metoprolol succinate (TOPROL-XL) 50 MG 24 hr tablet Take 50 mg by mouth daily.    pantoprazole (PROTONIX) 40 MG tablet Take 1 tablet (40 mg total) by mouth 2 (two) times daily.   Polyethyl Glycol-Propyl Glycol (SYSTANE OP) Place 1 drop into both eyes 2 (two) times daily.    simvastatin (ZOCOR) 20 MG tablet Take 20 mg by mouth every  evening.     Allergies:   Ace inhibitors, Keflex [cephalexin], and Rifadin [rifampin]   Social History   Socioeconomic History   Marital status: Widowed    Spouse name: Not on file   Number of children: Not on file   Years of education: Not on file   Highest education level: Not on file  Occupational History   Not on file  Social Needs   Financial resource strain: Not on file   Food insecurity    Worry: Not on file    Inability: Not on file   Transportation needs    Medical: Not on file    Non-medical: Not on file  Tobacco Use   Smoking status: Never Smoker   Smokeless tobacco: Never Used  Substance and Sexual Activity   Alcohol use: No   Drug use: No   Sexual activity: Not Currently  Lifestyle   Physical activity    Days per week: Not on file    Minutes per session: Not on file   Stress: Not on file  Relationships   Social connections    Talks on phone: Not on file  Gets together: Not on file    Attends religious service: Not on file    Active member of club or organization: Not on file    Attends meetings of clubs or organizations: Not on file    Relationship status: Not on file  Other Topics Concern   Not on file  Social History Narrative   Not on file     Family History: The patient's family history includes Hypertension in her father and mother; Stroke in her mother. There is no history of Heart attack.  ROS:   Please see the history of present illness.      EKGs/Labs/Other Studies Reviewed:    The following studies were reviewed today: Echocardiogram, prior EKG, lab work, office note reviewed  EKG: Normal sinus rhythm left bundle branch block  Recent Labs: 09/29/2018: B Natriuretic Peptide 154.7 05/17/2019: ALT 10 05/19/2019: BUN 7; Creatinine, Ser 0.74; Hemoglobin 8.5; Magnesium 1.6; Platelets 366; Potassium 3.4; Sodium 138  Recent Lipid Panel    Component Value Date/Time   CHOL 133 10/27/2018 0811   CHOL 145 10/17/2018  1255   TRIG 73.0 10/27/2018 0811   HDL 39.60 10/27/2018 0811   HDL 47 10/17/2018 1255   CHOLHDL 3 10/27/2018 0811   VLDL 14.6 10/27/2018 0811   LDLCALC 79 10/27/2018 0811   LDLCALC 80 10/17/2018 1255    Physical Exam:    VS:  BP 140/60    Pulse 78    Ht _0  (1.575 m)    Wt 190 lb (86.2 kg)    SpO2 98%    BMI 34.75 kg/m     Wt Readings from Last 3 Encounters:  07/23/19 190 lb (86.2 kg)  06/09/19 188 lb 12.8 oz (85.6 kg)  05/17/19 180 lb (81.6 kg)     GEN: Well nourished, well developed, in no acute distress  HEENT: normal  Neck: no JVD, carotid bruits, or masses Cardiac: RRR; 2/6 SM, no rubs, or gallops,no edema  Respiratory:  clear to auscultation bilaterally, normal work of breathing GI: soft, nontender, nondistended, + BS MS: no deformity or atrophy  Skin: warm and dry, no rash Neuro:  Alert and Oriented x 3, Strength and sensation are intact Psych: euthymic mood, full affect   ASSESSMENT:    1. S/P TAVR (transcatheter aortic valve replacement)   2. H/O: GI bleed   3. PAF (paroxysmal atrial fibrillation) ([place])   4. LBBB (left bundle branch block)   5. Dizziness    PLAN:    In order of problems listed above:  Recurrent GI bleed -Unable to take Xarelto.  Risks outweighs benefits. -I have asked her to contact Dr. Laurann Montana about refills on iron supplementation as well as PPI. -She is also off of aspirin as well.  I also believe that this would increase her risk of GI bleeding recurrence and she should remain off of this medication.  TAVR valve - Normal function on echocardiogram with mean gradient of 24 mmHg elevated from patient prosthetic mismatch.  Much improved symptoms.   Paroxysmal atrial fibrillation - Doing quite well, has been maintaining sinus rhythm thankfully.  She is not able to take Xarelto because of bleeding risks.  She has had recurrent GI bleeds which are life-threatening.  She is also off of the aspirin at this time.  Hyperlipidemia -Continue  statin therapy LDL 81   Essential hypertension, excellent control.  Medications reviewed.  No changes made.  Transient dizziness/left bundle branch block -No syncope.  Prior monitor has been reassuring.  She still has an occasional episode.  I have asked her to be careful with these and make sure that she sits down or lays down if she does begin to feel this sensation.  09/02/2018 monitor.  33-monthfollow-up with LCecille Rubin 671monthme.  Medication Adjustments/Labs and Tests Ordered: Current medicines are reviewed at length with the patient today.  Concerns regarding medicines are outlined above.  No orders of the defined types were placed in this encounter.  No orders of the defined types were placed in this encounter.   Patient Instructions  Medication Instructions:  The current medical regimen is effective;  continue present plan and medications.  *If you need a refill on your cardiac medications before your next appointment, please call your pharmacy*  Follow-Up: [facility] and [facility] our [facility].  As part of our continuing mission to provide you with exceptional heart care, we have created designated Provider Care Teams.  These Care Teams include your primary Cardiologist (physician) and Advanced Practice Providers (APPs -  Physician Assistants and Nurse Practitioners) who all work together to provide you with the care you need, when you need it.  Your next appointment:   3 months with LoTruitt Merlend 6 months with Dr SkMarlou Porch The format for your next appointment:   In Person  Provider:   You may see MaCandee FurbishMD or one of the following Advanced Practice Providers on your designated Care Team:    LoTruitt MerleNP  LaCecilie KicksNP  JiKathyrn DrownNP   Ask Dr GrLaurann Montanaf you need to continue on iron supplement and Protonix.    Signed, MaCandee FurbishMD  07/23/2019 10:48 AM    [facility]

## 2019-07-23 NOTE — Patient Instructions (Signed)
Medication Instructions:  The current medical regimen is effective;  continue present plan and medications.  *If you need a refill on your cardiac medications before your next appointment, please call your pharmacy*  Follow-Up: At [facility], you and your health needs are our priority.  As part of our continuing mission to provide you with exceptional heart care, we have created designated Provider Care Teams.  These Care Teams include your primary Cardiologist (physician) and Advanced Practice Providers (APPs -  Physician Assistants and Nurse Practitioners) who all work together to provide you with the care you need, when you need it.  Your next appointment:   3 months with Truitt Merle and 6 months with Dr Marlou Porch.  The format for your next appointment:   In Person  Provider:   You may see [name], MD or one of the following Advanced Practice Providers on your designated Care Team:    Truitt Merle, NP  Cecilie Kicks, NP  Kathyrn Drown, NP   Ask Dr Laurann Montana if you need to continue on iron supplement and Protonix.

## 2019-07-28 ENCOUNTER — Other Ambulatory Visit: Payer: Self-pay

## 2019-07-28 MED ORDER — ACCU-CHEK AVIVA PLUS W/DEVICE KIT: PACK | 0 refills | Status: DC

## 2019-07-28 MED ORDER — GLUCOSE BLOOD VI STRP: ORAL_STRIP | 2 refills | Status: DC

## 2019-07-31 ENCOUNTER — Other Ambulatory Visit: Payer: Self-pay

## 2019-07-31 MED ORDER — GLUCOSE BLOOD VI STRP: ORAL_STRIP | 2 refills | Status: DC

## 2019-08-03 ENCOUNTER — Other Ambulatory Visit: Payer: Self-pay

## 2019-08-03 ENCOUNTER — Encounter: Payer: Self-pay | Admitting: Endocrinology

## 2019-08-03 ENCOUNTER — Ambulatory Visit (INDEPENDENT_AMBULATORY_CARE_PROVIDER_SITE_OTHER): Payer: Medicare Other | Admitting: Endocrinology

## 2019-08-03 VITALS — BP 140/62 | HR 69 | Ht 62.0 in | Wt 189.0 lb

## 2019-08-03 DIAGNOSIS — E042 Nontoxic multinodular goiter: Secondary | ICD-10-CM | POA: Diagnosis not present

## 2019-08-03 DIAGNOSIS — I10 Essential (primary) hypertension: Secondary | ICD-10-CM | POA: Diagnosis not present

## 2019-08-03 DIAGNOSIS — E119 Type 2 diabetes mellitus without complications: Secondary | ICD-10-CM | POA: Diagnosis not present

## 2019-08-03 DIAGNOSIS — E1165 Type 2 diabetes mellitus with hyperglycemia: Secondary | ICD-10-CM

## 2019-08-03 LAB — COMPREHENSIVE METABOLIC PANEL
ALT: 7 U/L (ref 0–35)
AST: 13 U/L (ref 0–37)
Albumin: 4 g/dL (ref 3.5–5.2)
Alkaline Phosphatase: 59 U/L (ref 39–117)
BUN: 14 mg/dL (ref 6–23)
CO2: 33 mEq/L — ABNORMAL HIGH (ref 19–32)
Calcium: 10.2 mg/dL (ref 8.4–10.5)
Chloride: 99 mEq/L (ref 96–112)
Creatinine, Ser: 0.76 mg/dL (ref 0.40–1.20)
GFR: 88.91 mL/min (ref >60.00)
Glucose, Bld: 114 mg/dL — ABNORMAL HIGH (ref 70–99)
Potassium: 4 mEq/L (ref 3.5–5.1)
Sodium: 138 mEq/L (ref 135–145)
Total Bilirubin: 0.2 mg/dL (ref 0.2–1.2)
Total Protein: 8.4 g/dL — ABNORMAL HIGH (ref 6.0–8.3)

## 2019-08-03 LAB — MICROALBUMIN / CREATININE URINE RATIO
Creatinine,U: 103.9 mg/dL
Microalb Creat Ratio: 0.7 mg/g (ref 0.0–30.0)
Microalb, Ur: <0.7 mg/dL (ref 0.0–1.9)

## 2019-08-03 LAB — GLUCOSE, POCT (MANUAL RESULT ENTRY): POC Glucose: 118 mg/dl — AB (ref 70–99)

## 2019-08-03 NOTE — Progress Notes (Signed)
Patient ID: Janet Williamson, female   DOB: [birth year], 78 y.o.   MRN: [id number]   Reason for Appointment: Diabetes follow-up   History of Present Illness   Type 2 DIABETES MELITUS, date of diagnosis 02/2012     She has had mild diabetes which has been well controlled with metformin ER alone. Has had no side effects from metformin  She also has had diabetes education in 2013 . Oral hypoglycemic drugs: Metformin ER using 750 mg tablets, 1.5 g daily.       Side effects from medications: None  Her A1c has been inaccurate and was last 4.7   Current management, blood sugar patterns and problems identified:  Currently no labs available to assess her level of control  She did not bring her monitor for download  Her blood sugars are from recall fairly good and only occasionally upper normal postprandial  No recent change in weight  She is taking her Metformin, 2 tablets daily on a regular basis without GI side effects  She has difficulty walking and not able to do much  He has had very stable renal functions  Variable and she is still not able to follow her diet well   Monitors blood glucose:  less than once a day     Glucometer:  Accu-Chek  Blood Glucose readings from patient recall show the following  Am about 125  After meals 145-180  Previous average 150     Previous visits with dietitian: Several years ago  Dinner At 5-6 pm       Physical activity: exercise: walking a little  Wt Readings from Last 3 Encounters:  08/03/19 189 lb (85.7 kg)  07/23/19 190 lb (86.2 kg)  06/09/19 188 lb 12.8 oz (85.6 kg)     Lab Results  Component Value Date   HGBA1C 4.7 (L) 05/17/2019   HGBA1C 5.6 10/27/2018   HGBA1C 5.4 10/01/2018   Lab Results  Component Value Date   MICROALBUR <0.7 08/03/2019   [place] 79 10/27/2018   CREATININE 0.76 08/03/2019     Other active problems evaluated today: See review of systems   LABS:  Office Visit on 08/03/2019   Component Date Value Ref Range Status  . POC Glucose 08/03/2019 118* 70 - 99 mg/dl Final  . Microalb, Ur 08/03/2019 <0.7  0.0 - 1.9 mg/dL Final  . Creatinine,U 08/03/2019 103.9  mg/dL Final  . Microalb Creat Ratio 08/03/2019 0.7  0.0 - 30.0 mg/g Final  . Fructosamine 08/03/2019 247  0 - 285 umol/L Final   Comment: Published reference interval for apparently healthy subjects between age 16 and 71 is 52 - 285 umol/L and in a poorly controlled diabetic population is 228 - 563 umol/L with a mean of 396 umol/L.   Marland Kitchen Sodium 08/03/2019 138  135 - 145 mEq/L Final  . Potassium 08/03/2019 4.0  3.5 - 5.1 mEq/L Final  . Chloride 08/03/2019 99  96 - 112 mEq/L Final  . CO2 08/03/2019 33* 19 - 32 mEq/L Final  . Glucose, Bld 08/03/2019 114* 70 - 99 mg/dL Final  . BUN 08/03/2019 14  6 - 23 mg/dL Final  . Creatinine, Ser 08/03/2019 0.76  0.40 - 1.20 mg/dL Final  . Total Bilirubin 08/03/2019 0.2  0.2 - 1.2 mg/dL Final  . Alkaline Phosphatase 08/03/2019 59  39 - 117 U/L Final  . AST 08/03/2019 13  0 - 37 U/L Final  . ALT 08/03/2019 7  0 - 35 U/L Final  .  Total Protein 08/03/2019 8.4* 6.0 - 8.3 g/dL Final  . Albumin 08/03/2019 4.0  3.5 - 5.2 g/dL Final  . Calcium 08/03/2019 10.2  8.4 - 10.5 mg/dL Final  . GFR 08/03/2019 88.91  >60.00 mL/min Final    Allergies as of 08/03/2019      Reactions   Ace Inhibitors Swelling, Other (See Comments)   Angioedema   Keflex [cephalexin] Swelling, Other (See Comments)   Lips became swollwn   Rifadin [rifampin] Swelling, Other (See Comments)   Lips became swollen      Medication List       Accurate as of August 03, 2019 11:59 PM. If you have any questions, ask your nurse or doctor.        Accu-Chek Aviva Plus w/Device Kit Use Accu Chek Aviva Plus meter to check blood sugar once daily. DX:E11.65   acetaminophen 325 MG tablet Commonly known as: TYLENOL Take 325-650 mg by mouth every 8 (eight) hours as needed (for headaches).   amLODipine 5 MG tablet  Commonly known as: NORVASC Take 5 mg by mouth daily.   ferrous sulfate 325 (65 FE) MG tablet Take 1 tablet (325 mg total) by mouth daily.   glucose blood test strip Use Accu chek aviva plus test strips as instructed to check blood sugar once daily.DX:E11.65   hydrochlorothiazide 25 MG tablet Commonly known as: HYDRODIURIL Take 25 mg by mouth daily.   metFORMIN 750 MG 24 hr tablet Commonly known as: GLUCOPHAGE-XR Take 1,500 mg by mouth daily with breakfast.   metoprolol succinate 50 MG 24 hr tablet Commonly known as: TOPROL-XL Take 50 mg by mouth daily.   pantoprazole 40 MG tablet Commonly known as: PROTONIX Take 1 tablet (40 mg total) by mouth 2 (two) times daily.   simvastatin 20 MG tablet Commonly known as: ZOCOR Take 20 mg by mouth every evening.   SYSTANE OP Place 1 drop into both eyes 2 (two) times daily.   Vitamin D3 125 MCG (5000 UT) Caps Take 5,000 Units by mouth daily.       Allergies:  Allergies  Allergen Reactions  . Ace Inhibitors Swelling and Other (See Comments)    Angioedema   . Keflex [Cephalexin] Swelling and Other (See Comments)    Lips became swollwn  . Rifadin [Rifampin] Swelling and Other (See Comments)    Lips became swollen    Past Medical History:  Diagnosis Date  . Acute lower GI bleeding 05/2019  . Anemia   . Cardiac mass    a. 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 Not seen on most recent TEE 2019.  . Cardiomyopathy in other disease   . Cataracts, bilateral   . Diabetes mellitus    Type 2  . Generalized osteoarthritis   . GERD (gastroesophageal reflux disease)   . HLD (hyperlipidemia)   . Hypertension   . Hypothyroidism   . LBBB (left bundle branch block)   . PAF (paroxysmal atrial fibrillation) ([place])    a. s/p DCCV 06/2017, on Xarelto  . Phlebitis   . S/P TAVR (transcatheter aortic valve replacement) 04/15/2018   Edwards Sapien 3 THV (size 23 mm, model # 9600TFX, serial # [id number]) via the TF approach  . Severe aortic  stenosis    a. s/p TAVR 04/2018.  . Stroke [facility]) 2008    Past Surgical History:  Procedure Laterality Date  . ABDOMINAL HYSTERECTOMY    . BIOPSY  05/19/2019   Procedure: BIOPSY;  Surgeon: Ronald Lobo, MD;  Location: [place];  Service:  Endoscopy;;  . COLONOSCOPY    . COLONOSCOPY WITH PROPOFOL N/A 10/02/2018   Procedure: COLONOSCOPY WITH PROPOFOL;  Surgeon: Arta Silence, MD;  Location: [place];  Service: Endoscopy;  Laterality: N/A;  . ENTEROSCOPY N/A 05/19/2019   Procedure: ENTEROSCOPY;  Surgeon: Ronald Lobo, MD;  Location: [facility] ENDOSCOPY;  Service: Endoscopy;  Laterality: N/A;  . ESOPHAGOGASTRODUODENOSCOPY (EGD) WITH PROPOFOL N/A 10/02/2018   Procedure: ESOPHAGOGASTRODUODENOSCOPY (EGD) WITH PROPOFOL;  Surgeon: Arta Silence, MD;  Location: [place];  Service: Endoscopy;  Laterality: N/A;  . ESOPHAGOGASTRODUODENOSCOPY (EGD) WITH PROPOFOL N/A 04/23/2019   Procedure: ESOPHAGOGASTRODUODENOSCOPY (EGD) WITH PROPOFOL;  Surgeon: Wonda Horner, MD;  Location[facility] ENDOSCOPY;  Service: Endoscopy;  Laterality: N/A;  . EYE SURGERY Bilateral    cataract removal  . GIVENS CAPSULE STUDY N/A 10/02/2018   Procedure: GIVENS CAPSULE STUDY;  Surgeon: Arta Silence, MD;  Location: [facility] ENDOSCOPY;  Service: Endoscopy;  Laterality: N/A;  . RIGHT/LEFT HEART CATH AND CORONARY ANGIOGRAPHY N/A 03/06/2018   Procedure: RIGHT/LEFT HEART CATH AND CORONARY ANGIOGRAPHY;  Surgeon: Belva Crome, MD;  Location: [place] [facility];  Service: Cardiovascular;  Laterality: N/A;  . SMALL BOWEL ENTEROSCOPY  05/19/2019  . TEE WITHOUT CARDIOVERSION N/A 04/01/2018   Procedure: TRANSESOPHAGEAL ECHOCARDIOGRAM (TEE);  Surgeon: Jerline Pain, MD;  Location: [facility] ENDOSCOPY;  Service: Cardiovascular;  Laterality: N/A;  . TEE WITHOUT CARDIOVERSION N/A 04/15/2018   Procedure: TRANSESOPHAGEAL ECHOCARDIOGRAM (TEE);  Surgeon: Sherren Mocha, MD;  Location: [place];  Service: Open Heart Surgery;  Laterality: N/A;  . TOTAL KNEE  ARTHROPLASTY Right 04/14/2013   Dr Marlou Sa  . TOTAL KNEE ARTHROPLASTY Right 04/14/2013   Procedure: TOTAL KNEE ARTHROPLASTY;  Surgeon: Meredith Pel, MD;  Location: [place];  Service: Orthopedics;  Laterality: Right;  . TRANSCATHETER AORTIC VALVE REPLACEMENT, TRANSFEMORAL  04/15/2018  . TRANSCATHETER AORTIC VALVE REPLACEMENT, TRANSFEMORAL Bilateral 04/15/2018   Procedure: TRANSCATHETER AORTIC VALVE REPLACEMENT, TRANSFEMORAL;  Surgeon: Sherren Mocha, MD;  Location: [place];  Service: Open Heart Surgery;  Laterality: Bilateral;    Family History  Problem Relation Age of Onset  . Stroke Mother   . Hypertension Mother   . Hypertension Father   . Heart attack Neg Hx     Social History:  reports that she has never smoked. She has never used smokeless tobacco. She reports that she does not drink alcohol or use drugs.  Review of Systems:  HYPERTENSION:  she has been managed with 5 mg amlodipine, 50 mg metoprolol and hydrochlorothiazide 25 mg, followed by PCP.  She had swelling of the lips with ramipril and has not been prescribed an ARB   HYPERLIPIDEMIA: The lipid abnormality consists of elevated LDL treated with simvastatin 20 mg by PCP and LDL is controlled   recent LDL 81 from PCP   Lab Results  Component Value Date   CHOL 133 10/27/2018   HDL 39.60 10/27/2018   LDLCALC 79 10/27/2018   TRIG 73.0 10/27/2018   CHOLHDL 3 10/27/2018    Multinodular goiter:  She has had a long-standing multinodular goiter, last ultrasound was done in 05/2012 which showed the largest nodule to be 4.1 cm nodule in the isthmus Her exam has been fairly stable  TSH has been normal, last 1.6  Lab Results  Component Value Date   TSH 1.09 04/24/2018    Last eye exam was in 12/2018, report not available  HYPERCALCEMIA: has had only 1 previous high calcium She does take 25 mg HCTZ PTH normal  Calcium has been normal this  year   Lab Results  Component Value Date   CALCIUM 10.2 08/03/2019   CALCIUM  9.3 05/19/2019   CALCIUM 9.2 05/18/2019   CALCIUM 9.8 05/17/2019   CALCIUM 9.1 04/23/2019    Lab Results  Component Value Date   PTH 23 08/22/2017   CALCIUM 10.2 08/03/2019   CAION 1.14 (L) 04/15/2018     Examination: BP 140/62 (BP Location: Left Arm, Patient Position: Sitting, Cuff Size: Normal)   Pulse 69   Ht _0  (1.575 m)   Wt 189 lb (85.7 kg)   SpO2 96%   BMI 34.57 kg/m   Body mass index is 34.57 kg/m.      ASSESSMENT/ PLAN:   Diabetes type 2 with obesity See history of present illness for discussion of current diabetes management, blood sugar patterns and problems identified  She is on Metformin only  Currently no labs are available and A1c has been previously falsely low partly because of anemia Today lab glucose 118 fasting  Her home blood sugars are mostly normal Reminded her to check more readings after meals  We will continue her 1500 mg metformin long-term as it appears to be giving her adequate control To have chemistry panel and fructosamine checked today as also microalbumin  Follow-up in 4 months   HYPERTENSION: Well controlled    Elayne Snare 08/04/2019, 8:19 AM   Note: This office note was prepared with Dragon voice recognition system technology. Any transcriptional errors that result from this process are unintentional.  Addendum: Labs as follows, fructosamine 247 Other labs unremarkable  Office Visit on 08/03/2019  Component Date Value Ref Range Status  . POC Glucose 08/03/2019 118* 70 - 99 mg/dl Final  . Microalb, Ur 08/03/2019 <0.7  0.0 - 1.9 mg/dL Final  . Creatinine,U 08/03/2019 103.9  mg/dL Final  . Microalb Creat Ratio 08/03/2019 0.7  0.0 - 30.0 mg/g Final  . Fructosamine 08/03/2019 247  0 - 285 umol/L Final   Comment: Published reference interval for apparently healthy subjects between age 13 and 78 is 40 - 285 umol/L and in a poorly controlled diabetic population is 228 - 563 umol/L with a mean of 396 umol/L.   Marland Kitchen Sodium  08/03/2019 138  135 - 145 mEq/L Final  . Potassium 08/03/2019 4.0  3.5 - 5.1 mEq/L Final  . Chloride 08/03/2019 99  96 - 112 mEq/L Final  . CO2 08/03/2019 33* 19 - 32 mEq/L Final  . Glucose, Bld 08/03/2019 114* 70 - 99 mg/dL Final  . BUN 08/03/2019 14  6 - 23 mg/dL Final  . Creatinine, Ser 08/03/2019 0.76  0.40 - 1.20 mg/dL Final  . Total Bilirubin 08/03/2019 0.2  0.2 - 1.2 mg/dL Final  . Alkaline Phosphatase 08/03/2019 59  39 - 117 U/L Final  . AST 08/03/2019 13  0 - 37 U/L Final  . ALT 08/03/2019 7  0 - 35 U/L Final  . Total Protein 08/03/2019 8.4* 6.0 - 8.3 g/dL Final  . Albumin 08/03/2019 4.0  3.5 - 5.2 g/dL Final  . Calcium 08/03/2019 10.2  8.4 - 10.5 mg/dL Final  . GFR 08/03/2019 88.91  >60.00 mL/min Final

## 2019-08-04 LAB — FRUCTOSAMINE: Fructosamine: 247 umol/L (ref 0–285)

## 2019-10-29 NOTE — Progress Notes (Signed)
CARDIOLOGY OFFICE NOTE  Date:  11/03/2019    Janet Williamson Date of Birth: 1941/04/03 Medical Record #[id number]  PCP:  Kelton Pillar, MD  Cardiologist:  Marisa Cyphers  Chief Complaint  Patient presents with  . Follow-up    Seen for Dr. Marlou Porch    History of Present Illness: Janet Williamson is a 79 y.o. female who presents today for a 3 month check.  Seen for Dr. Marlou Porch. Has seen Dr. Burt Knack for structural heart.   She has a history ofembolic CVA, MAC,LBBB,HTN, HLD, DMT2, PAF on Xarelto and severe AS s/p TAVR (04/15/18).   She was seen by Dr. Cyndia Bent in 2008 when she presented with an embolic stroke and was found to have a mass on her mitral valve.It was unclear if this was a fibroblastoma or papilloma or possibly dystrophic calcification and we decided to follow it. She has been followed by Dr. Marlou Porch since that and has done well but over the past year has developed progressive shortness of breath and fatigue with activity.She had an echo on 02/26/2018 showing progression to severe aortic stenosis with a mean gradient of 47 mmHg and a peak gradient of 71 mmHg. The dimensionless index was 0.34. Left ventricular ejection fraction was 55 to 60%.She underwent cardiac catheterization on 03/06/2018 which showed no significant coronary disease. The peak to peak gradient across the aortic valve was 32 mmHg. She was evaluated by Dr. Burt Knack and a repeat TEE was performed to evaluate the mitral valve given her history of a mitral valve mass. This showed no evidence of mitral valve mass or vegetation. There is mild regurgitation. The leaflets were mildly thickened with mild mitral annular calcification. The aortic valve was severely calcified and restricted with a mean gradient of 36 mmHg.   She underwentsuccessful TAVR with a12m Edwards Sapien 3 THV via the TF approach on 04/15/18. Post operative echo showed higher than expected transvalvular aortic gradients likely related to  patient-prosthesis mismatch with a mean gradient of 22 mmHgwithno paravalvular regurgitation. She was discharged the following day on Asprin 81 mg daily and home Xarelto .1 month echo showedEF 55% with normally functioning TAVR with no PVL and mean gradient 237mHg.  She was admitted in 09/2018 for GI bleed. Hg 5.2 and she was transfused. Colonoscopy was normal. EGD showed non bleeding erosive gastropathy. Capsule endoscopy showed duodenal ulcer. She was started on a PPI and aspirin discontinued. Follow up CBC showed Hg 9.6.  She was seen in June by KaBonney LeitzPA for a telehealth visit and had had follow up echo - some mild dizziness noted but otherwise, seemed to be ok. I then saw her for a virtual visit in September - she had been admitted with recurrent GI bleed - ended up having repeat EGD and found to have posible proximal hemorrhagic gastritis but no ulcer - left on PPI and Carafate - Xarelto held for one week - then discussion for possibly lowering of the dose. She was doing better at my visit.   Last seen by Dr. SkMarlou Porchn October - decision that risk of bleeding outweighed the use of Xarelto and this was not restarted.   The patient does not have symptoms concerning for COVID-19 infection (fever, chills, cough, or new shortness of breath).   Comes in today. Here alone. She had her first COVID vaccine this past Sunday at her church. Little nagging headache in the mornings - gets better as the day goes on - otherwise, feels  good. She has moved out of her daughter's house - now has her own place and she does all her own housework/laundry/etc.  No chest pain. Not short of breath. No bleeding noted. BP is good. She had lab back in October with PCP. She feels ok on her medicines and overall has no real concerns.   Past Medical History:  Diagnosis Date  . Acute lower GI bleeding 05/2019  . Anemia   . Cardiac mass    a. 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 Not seen on most recent TEE  2019.  . Cardiomyopathy in other disease   . Cataracts, bilateral   . Diabetes mellitus    Type 2  . Generalized osteoarthritis   . GERD (gastroesophageal reflux disease)   . HLD (hyperlipidemia)   . Hypertension   . Hypothyroidism   . LBBB (left bundle branch block)   . PAF (paroxysmal atrial fibrillation) ([place])    a. s/p DCCV 06/2017, on Xarelto  . Phlebitis   . S/P TAVR (transcatheter aortic valve replacement) 04/15/2018   Edwards Sapien 3 THV (size 23 mm, model # 9600TFX, serial # [id number]) via the TF approach  . Severe aortic stenosis    a. s/p TAVR 04/2018.  . Stroke [facility]) 2008    Past Surgical History:  Procedure Laterality Date  . ABDOMINAL HYSTERECTOMY    . BIOPSY  05/19/2019   Procedure: BIOPSY;  Surgeon: Ronald Lobo, MD;  Location: [place];  Service: Endoscopy;;  . COLONOSCOPY    . COLONOSCOPY WITH PROPOFOL N/A 10/02/2018   Procedure: COLONOSCOPY WITH PROPOFOL;  Surgeon: Arta Silence, MD;  Location: [place];  Service: Endoscopy;  Laterality: N/A;  . ENTEROSCOPY N/A 05/19/2019   Procedure: ENTEROSCOPY;  Surgeon: Ronald Lobo, MD;  Location: [facility] ENDOSCOPY;  Service: Endoscopy;  Laterality: N/A;  . ESOPHAGOGASTRODUODENOSCOPY (EGD) WITH PROPOFOL N/A 10/02/2018   Procedure: ESOPHAGOGASTRODUODENOSCOPY (EGD) WITH PROPOFOL;  Surgeon: Arta Silence, MD;  Location: [place];  Service: Endoscopy;  Laterality: N/A;  . ESOPHAGOGASTRODUODENOSCOPY (EGD) WITH PROPOFOL N/A 04/23/2019   Procedure: ESOPHAGOGASTRODUODENOSCOPY (EGD) WITH PROPOFOL;  Surgeon: Wonda Horner, MD;  Location: [facility] ENDOSCOPY;  Service: Endoscopy;  Laterality: N/A;  . EYE SURGERY Bilateral    cataract removal  . GIVENS CAPSULE STUDY N/A 10/02/2018   Procedure: GIVENS CAPSULE STUDY;  Surgeon: Arta Silence, MD;  Location: [facility] ENDOSCOPY;  Service: Endoscopy;  Laterality: N/A;  . RIGHT/LEFT HEART CATH AND CORONARY ANGIOGRAPHY N/A 03/06/2018   Procedure: RIGHT/LEFT HEART CATH AND CORONARY  ANGIOGRAPHY;  Surgeon: Belva Crome, MD;  Location: [place] CV LAB;  Service: Cardiovascular;  Laterality: N/A;  . SMALL BOWEL ENTEROSCOPY  05/19/2019  . TEE WITHOUT CARDIOVERSION N/A 04/01/2018   Procedure: TRANSESOPHAGEAL ECHOCARDIOGRAM (TEE);  Surgeon: Jerline Pain, MD;  Location: [facility] ENDOSCOPY;  Service: Cardiovascular;  Laterality: N/A;  . TEE WITHOUT CARDIOVERSION N/A 04/15/2018   Procedure: TRANSESOPHAGEAL ECHOCARDIOGRAM (TEE);  Surgeon: Sherren Mocha, MD;  Location: [place];  Service: Open Heart Surgery;  Laterality: N/A;  . TOTAL KNEE ARTHROPLASTY Right 04/14/2013   Dr Marlou Sa  . TOTAL KNEE ARTHROPLASTY Right 04/14/2013   Procedure: TOTAL KNEE ARTHROPLASTY;  Surgeon: Meredith Pel, MD;  Location: [place];  Service: Orthopedics;  Laterality: Right;  . TRANSCATHETER AORTIC VALVE REPLACEMENT, TRANSFEMORAL  04/15/2018  . TRANSCATHETER AORTIC VALVE REPLACEMENT, TRANSFEMORAL Bilateral 04/15/2018   Procedure: TRANSCATHETER AORTIC VALVE REPLACEMENT, TRANSFEMORAL;  Surgeon: Sherren Mocha, MD;  Location: [place];  Service: Open Heart Surgery;  Laterality: Bilateral;     Medications:  Current Meds  Medication Sig  . acetaminophen (TYLENOL) 325 MG tablet Take 325-650 mg by mouth every 8 (eight) hours as needed (for headaches).   Marland Kitchen amLODipine (NORVASC) 5 MG tablet Take 5 mg by mouth daily.  . Blood Glucose Monitoring Suppl (ACCU-CHEK AVIVA PLUS) w/Device KIT Use Accu Chek Aviva Plus meter to check blood sugar once daily. DX:E11.65  . Cholecalciferol (VITAMIN D3) 5000 units CAPS Take 5,000 Units by mouth daily.  Marland Kitchen glucose blood test strip Use Accu chek aviva plus test strips as instructed to check blood sugar once daily.DX:E11.65  . hydrochlorothiazide (HYDRODIURIL) 25 MG tablet Take 25 mg by mouth daily.   . hydrOXYzine (ATARAX/VISTARIL) 25 MG tablet Take 25 mg by mouth 3 (three) times daily as needed.  . metFORMIN (GLUCOPHAGE-XR) 750 MG 24 hr tablet Take 1,500 mg by mouth daily with breakfast.    . metoprolol succinate (TOPROL-XL) 50 MG 24 hr tablet Take 50 mg by mouth daily.   Vladimir Faster Glycol-Propyl Glycol (SYSTANE OP) Place 1 drop into both eyes 2 (two) times daily.   . simvastatin (ZOCOR) 20 MG tablet Take 20 mg by mouth every evening.     Allergies: Allergies  Allergen Reactions  . Ace Inhibitors Swelling and Other (See Comments)    Angioedema   . Keflex [Cephalexin] Swelling and Other (See Comments)    Lips became swollwn  . Rifadin [Rifampin] Swelling and Other (See Comments)    Lips became swollen    Social History: The patient  reports that she has never smoked. She has never used smokeless tobacco. She reports that she does not drink alcohol or use drugs.   Family History: The patient's family history includes Hypertension in her father and mother; Stroke in her mother.   Review of Systems: Please see the history of present illness.   All other systems are reviewed and negative.   Physical Exam: VS:  BP 132/68   Pulse 76   Ht _0  (1.575 m)   Wt 193 lb 12.8 oz (87.9 kg)   SpO2 98%   BMI 35.45 kg/m  .  BMI Body mass index is 35.45 kg/m.  Wt Readings from Last 3 Encounters:  11/03/19 193 lb 12.8 oz (87.9 kg)  08/03/19 189 lb (85.7 kg)  07/23/19 190 lb (86.2 kg)    General: Alert and in no acute distress. She is obese.   HEENT: Normal.  Neck: Supple, no JVD, carotid bruits, or masses noted.  Cardiac: Regular rate and rhythm. Outflow murmur noted. No edema.  Respiratory:  Lungs are clear to auscultation bilaterally with normal work of breathing.  GI: Soft and nontender.  MS: No deformity or atrophy. Gait and ROM intact.  Skin: Warm and dry. Color is normal.  Neuro:  Strength and sensation are intact and no gross focal deficits noted.  Psych: Alert, appropriate and with normal affect.   LABORATORY DATA:  EKG:  EKG is not ordered today.   Lab Results  Component Value Date   WBC 8.0 05/19/2019   HGB 8.5 (L) 05/19/2019   HCT 28.4 (L)  05/19/2019   PLT 366 05/19/2019   GLUCOSE 114 (H) 08/03/2019   CHOL 133 10/27/2018   TRIG 73.0 10/27/2018   HDL 39.60 10/27/2018   LDLCALC 79 10/27/2018   ALT 7 08/03/2019   AST 13 08/03/2019   NA 138 08/03/2019   K 4.0 08/03/2019   CL 99 08/03/2019   CREATININE 0.76 08/03/2019   BUN 14 08/03/2019   CO2  33 (H) 08/03/2019   TSH 1.09 04/24/2018   INR 1.5 (H) 04/21/2019   HGBA1C 4.7 (L) 05/17/2019   MICROALBUR <0.7 08/03/2019       BNP (last 3 results) No results for input(s): BNP in the last 8760 hours.  ProBNP (last 3 results) No results for input(s): PROBNP in the last 8760 hours.   Other Studies Reviewed Today:  ECHO IMPRESSIONS 03/2019   1. The left ventricle has hyperdynamic systolic function, with an ejection fraction of >65%. The cavity size was normal. There is moderate concentric left ventricular hypertrophy. Left ventricular diastolic Doppler parameters are consistent with  impaired relaxation. Elevated left ventricular end-diastolic pressure.  2. The right ventricle has normal systolic function. The cavity was mildly enlarged. There is no increase in right ventricular wall thickness. Right ventricular systolic pressure is mildly elevated with an estimated pressure of 45.8 mmHg.  3. Left atrial size was mildly dilated.  4. Right atrial size was mildly dilated.  5. The mitral valve is degenerative. Mild thickening of the mitral valve leaflet. Mild calcification of the mitral valve leaflet. There is moderate mitral annular calcification present. Mitral valve regurgitation is moderate by color flow Doppler. No  evidence of mitral valve stenosis.  6. Tricuspid valve regurgitation is moderate.  7. Aortic valve regurgitation was not assessed by color flow Doppler. Moderate stenosis of the aortic valve.   2D ECHO 05/21/18 ( 1 month s/p TAVR) Study Conclusions - Left ventricle: The cavity size was normal. There was severe focal basal hypertrophy of the septum.  Systolic function was normal. The estimated ejection fraction was in the range of 55% to 60%. Wall motion was normal; there were no regional wall motion abnormalities. Doppler parameters are consistent with abnormal left ventricular relaxation (grade 1 diastolic dysfunction). - Aortic valve: TAVR with a 23 mm Edwards Sapien 3 THV via the TF approach on 04/15/18. Peak velocity (S): 324 cm/s. Mean gradient (S): 24 mm Hg. Valve area (Vmax): 1.11 cm^2. - Mitral valve: Moderately calcified annulus. - Left atrium: The atrium was mildly dilated. - Tricuspid valve: There was moderate regurgitation. - Pulmonic valve: There was moderate regurgitation. - Pulmonary arteries: Systolic pressure was mildly increased. PA peak pressure: 43 mm Hg (S).     TAVR OPERATIVE NOTE 04/15/2018  Procedure: Transcatheter Aortic Valve Replacement - Percutaneous Transfemoral Approach Edwards Sapien 3 THV (size 59m, model # 9600TFX, serial # [id number]  Co-Surgeons:Clarence H. ORoxy Manns MD and MSherren Mocha MD  Pre-operative Echo Findings: ? Severe aortic stenosis ? Normalleft ventricular systolic function  Post-operative Echo Findings: ? Noparavalvular leak ? Normal/unchangedleft ventricular systolic function  ___________________    ASSESSMENT & PLAN:    1. Prior TAVR from 2019 - last echo from June of 2020 - she is doing well clinically.   2. Prior GI bleeding - no longer on anticoagulation. Last HGB up to 10.2 per PCP.   3. PAF - remains in sinus by exam today. No reports of palpitations. No longer on anticoagulation - risk of bleeding too great and outweigh benefits.    4. HTN - BP is good here today - no changes made today.   5. HLD - on statin - labs by PCP  6. Chronic LBBB - no dizziness noted. No palpitations.   7. COVID-19 Education: The signs and symptoms of COVID-19 were discussed with the patient and how to  seek care for testing (follow up with PCP or arrange E-visit).  The importance of social distancing, staying at home, hand  hygiene and wearing a mask when out in public were discussed today. She has had her first COVID vaccine.   Current medicines are reviewed with the patient today.  The patient does not have concerns regarding medicines other than what has been noted above.  The following changes have been made:  See above.  Labs/ tests ordered today include:   No orders of the defined types were placed in this encounter.    Disposition:   FU with Dr. Marlou Porch in 4 months.    Patient is agreeable to this plan and will call if any problems develop in the interim.   SignedTruitt Merle, NP  11/03/2019 10:54 AM  [place] [address] [place], [place]  [address] Phone: [phone] Fax: [phone]

## 2019-11-01 ENCOUNTER — Ambulatory Visit: Payer: Medicare Other | Attending: Internal Medicine

## 2019-11-01 DIAGNOSIS — Z23 Encounter for immunization: Secondary | ICD-10-CM | POA: Insufficient documentation

## 2019-11-02 NOTE — Progress Notes (Signed)
   Covid-19 Vaccination Clinic  Name:  Janet Williamson    MRN: [id number] DOB: 13-Jun-1941  11/01/2019  Janet Williamson was observed post Covid-19 immunization for 15 minutes without incidence. She was provided with Vaccine Information Sheet and instruction to access the V-Safe system.   Janet Williamson was instructed to call 911 with any severe reactions post vaccine: Marland Kitchen Difficulty breathing  . Swelling of your face and throat  . A fast heartbeat  . A bad rash all over your body  . Dizziness and weakness    Immunizations Administered    Name Date Dose VIS Date Route   Moderna COVID-19 Vaccine 11/01/2019  2:48 PM 0.5 mL 09/08/2019 Intramuscular   Manufacturer: Gala Murdoch   Lot: [id number]   NDC: 10315-945-85      Documented on behalf of: C. Jimmey Ralph

## 2019-11-03 ENCOUNTER — Other Ambulatory Visit: Payer: Self-pay

## 2019-11-03 ENCOUNTER — Ambulatory Visit (INDEPENDENT_AMBULATORY_CARE_PROVIDER_SITE_OTHER): Payer: Medicare Other | Admitting: Nurse Practitioner

## 2019-11-03 ENCOUNTER — Encounter: Payer: Self-pay | Admitting: Nurse Practitioner

## 2019-11-03 VITALS — BP 132/68 | HR 76 | Ht 62.0 in | Wt 193.8 lb

## 2019-11-03 DIAGNOSIS — E785 Hyperlipidemia, unspecified: Secondary | ICD-10-CM

## 2019-11-03 DIAGNOSIS — Z7189 Other specified counseling: Secondary | ICD-10-CM

## 2019-11-03 DIAGNOSIS — I10 Essential (primary) hypertension: Secondary | ICD-10-CM | POA: Diagnosis not present

## 2019-11-03 DIAGNOSIS — I480 Paroxysmal atrial fibrillation: Secondary | ICD-10-CM

## 2019-11-03 DIAGNOSIS — Z952 Presence of prosthetic heart valve: Secondary | ICD-10-CM

## 2019-11-03 DIAGNOSIS — Z8719 Personal history of other diseases of the digestive system: Secondary | ICD-10-CM

## 2019-11-03 DIAGNOSIS — I447 Left bundle-branch block, unspecified: Secondary | ICD-10-CM

## 2019-11-03 NOTE — Patient Instructions (Addendum)
After Visit Summary:  We will be checking the following labs today - NONE   Medication Instructions:    Continue with your current medicines.    If you need a refill on your cardiac medications before your next appointment, please call your pharmacy.     Testing/Procedures To Be Arranged:  N/A  Follow-Up:   See Dr. Skains in 4 months.     At [facility], you and your health needs are our priority.  As part of our continuing mission to provide you with exceptional heart care, we have created designated Provider Care Teams.  These Care Teams include your primary Cardiologist (physician) and Advanced Practice Providers (APPs -  Physician Assistants and Nurse Practitioners) who all work together to provide you with the care you need, when you need it.  Special Instructions:  . Stay safe, stay home, wash your hands for at least 20 seconds and wear a mask when out in public.  . It was good to talk with you today.    Call the [facility] office at [phone] if you have any questions, problems or concerns.       

## 2019-11-25 ENCOUNTER — Other Ambulatory Visit: Payer: Self-pay | Admitting: Endocrinology

## 2019-11-29 ENCOUNTER — Ambulatory Visit: Payer: Medicare Other | Attending: Internal Medicine

## 2019-11-29 DIAGNOSIS — Z23 Encounter for immunization: Secondary | ICD-10-CM | POA: Insufficient documentation

## 2019-11-29 NOTE — Progress Notes (Signed)
   Covid-19 Vaccination Clinic  Name:  Janet Williamson    MRN: [id number] DOB: 08/07/1941  11/29/2019  [name] was observed post Covid-19 immunization for 15 minutes without incidence. She was provided with Vaccine Information Sheet and instruction to access the V-Safe system.   [name] was instructed to call 911 with any severe reactions post vaccine: Marland Kitchen Difficulty breathing  . Swelling of your face and throat  . A fast heartbeat  . A bad rash all over your body  . Dizziness and weakness    Immunizations Administered    Name Date Dose VIS Date Route   Moderna COVID-19 Vaccine 11/29/2019  2:16 PM 0.5 mL 09/08/2019 Intramuscular   Manufacturer: Moderna   Lot: [id number]   NDC: 94098-286-75

## 2019-12-03 ENCOUNTER — Other Ambulatory Visit: Payer: Self-pay

## 2019-12-03 ENCOUNTER — Other Ambulatory Visit (INDEPENDENT_AMBULATORY_CARE_PROVIDER_SITE_OTHER): Payer: Medicare Other

## 2019-12-03 DIAGNOSIS — E042 Nontoxic multinodular goiter: Secondary | ICD-10-CM | POA: Diagnosis not present

## 2019-12-03 DIAGNOSIS — E119 Type 2 diabetes mellitus without complications: Secondary | ICD-10-CM | POA: Diagnosis not present

## 2019-12-03 LAB — BASIC METABOLIC PANEL
BUN: 16 mg/dL (ref 6–23)
CO2: 33 mEq/L — ABNORMAL HIGH (ref 19–32)
Calcium: 10.6 mg/dL — ABNORMAL HIGH (ref 8.4–10.5)
Chloride: 98 mEq/L (ref 96–112)
Creatinine, Ser: 0.8 mg/dL (ref 0.40–1.20)
GFR: 83.73 mL/min (ref >60.00)
Glucose, Bld: 127 mg/dL — ABNORMAL HIGH (ref 70–99)
Potassium: 4.2 mEq/L (ref 3.5–5.1)
Sodium: 138 mEq/L (ref 135–145)

## 2019-12-03 LAB — LIPID PANEL
Cholesterol: 127 mg/dL (ref 0–200)
HDL: 34.6 mg/dL — ABNORMAL LOW (ref >39.00)
LDL Cholesterol: 73 mg/dL (ref 0–99)
NonHDL: 92.81
Total CHOL/HDL Ratio: 4
Triglycerides: 98 mg/dL (ref 0.0–149.0)
VLDL: 19.6 mg/dL (ref 0.0–40.0)

## 2019-12-03 LAB — TSH: TSH: 2.94 u[IU]/mL (ref 0.35–4.50)

## 2019-12-04 LAB — FRUCTOSAMINE: Fructosamine: 250 umol/L (ref 0–285)

## 2019-12-04 LAB — HEMOGLOBIN A1C: Hgb A1c MFr Bld: 6.8 % — ABNORMAL HIGH (ref 4.6–6.5)

## 2019-12-08 ENCOUNTER — Ambulatory Visit (INDEPENDENT_AMBULATORY_CARE_PROVIDER_SITE_OTHER): Payer: Medicare Other | Admitting: Endocrinology

## 2019-12-08 ENCOUNTER — Encounter: Payer: Self-pay | Admitting: Endocrinology

## 2019-12-08 ENCOUNTER — Other Ambulatory Visit: Payer: Self-pay

## 2019-12-08 VITALS — BP 140/62 | HR 77 | Ht 62.0 in | Wt 194.8 lb

## 2019-12-08 DIAGNOSIS — I10 Essential (primary) hypertension: Secondary | ICD-10-CM

## 2019-12-08 DIAGNOSIS — E042 Nontoxic multinodular goiter: Secondary | ICD-10-CM

## 2019-12-08 DIAGNOSIS — E7800 Pure hypercholesterolemia, unspecified: Secondary | ICD-10-CM

## 2019-12-08 DIAGNOSIS — E119 Type 2 diabetes mellitus without complications: Secondary | ICD-10-CM | POA: Diagnosis not present

## 2019-12-08 NOTE — Progress Notes (Signed)
Patient ID: Janet Williamson, female   DOB: April 24, 1941, 79 y.o.   MRN: [id number]   Reason for Appointment: Diabetes follow-up   History of Present Illness   Type 2 DIABETES MELITUS, date of diagnosis 02/2012     She has had mild diabetes which has been well controlled with metformin ER alone. Has had no side effects from metformin  She also has had diabetes education in 2013 . Oral hypoglycemic drugs: Metformin ER using 750 mg tablets, 1.5 g daily.       Side effects from medications: None  Her A1c is 6.8, previously lower because of anemia Fructosamine is 250  Current management, blood sugar patterns and problems identified:    She did bring her monitor for download  Her blood sugars are being checked primarily midday and afternoon and only occasionally in the evenings and minimal readings after meals are fasting  Blood sugars are excellent with highest reading only 143  Fasting glucose was 127 after a protein shake  Is consistent with taking her Metformin and has no GI side effects with this  Her weight has gone up since last year because of lack of exercise, she is having more joint pains   Monitors blood glucose:  less than once a day     Glucometer:  Accu-Chek  Blood Glucose readings from download show the following   PRE-MEAL Fasting  1 PM-4 PM Dinner  8 PM Overall  Glucose range: ?   82-143  86-126  132   Mean/median:   111    110        Previous visits with dietitian: Several years ago  Dinner At 5-6 pm       Physical activity: exercise: walking less  Wt Readings from Last 3 Encounters:  12/08/19 194 lb 12.8 oz (88.4 kg)  11/03/19 193 lb 12.8 oz (87.9 kg)  08/03/19 189 lb (85.7 kg)     Lab Results  Component Value Date   HGBA1C 6.8 (H) 12/03/2019   HGBA1C 4.7 (L) 05/17/2019   HGBA1C 5.6 10/27/2018   Lab Results  Component Value Date   MICROALBUR <0.7 08/03/2019   [address] 12/03/2019   CREATININE 0.80 12/03/2019     Other  active problems evaluated today: See review of systems  LABS:  Lab on 12/03/2019  Component Date Value Ref Range Status  . TSH 12/03/2019 2.94  0.35 - 4.50 uIU/mL Final  . Cholesterol 12/03/2019 127  0 - 200 mg/dL Final   ATP III Classification       Desirable:  < 200 mg/dL               Borderline High:  200 - 239 mg/dL          High:  > = 240 mg/dL  . Triglycerides 12/03/2019 98.0  0.0 - 149.0 mg/dL Final   Normal:  <150 mg/dLBorderline High:  150 - 199 mg/dL  . HDL 12/03/2019 34.60* >39.00 mg/dL Final  . VLDL 12/03/2019 19.6  0.0 - 40.0 mg/dL Final  . LDL Cholesterol 12/03/2019 73  0 - 99 mg/dL Final  . Total CHOL/HDL Ratio 12/03/2019 4   Final                  Men          Women1/2 Average Risk     3.4          3.3Average Risk          5.0  4.42X Average Risk          9.6          7.13X Average Risk          15.0          11.0                      . NonHDL 12/03/2019 92.81   Final   NOTE:  Non-HDL goal should be 30 mg/dL higher than patient's LDL goal (i.e. LDL goal of < 70 mg/dL, would have non-HDL goal of < 100 mg/dL)  . Fructosamine 12/03/2019 250  0 - 285 umol/L Final   Comment: Published reference interval for apparently healthy subjects between age 48 and 5 is 57 - 285 umol/L and in a poorly controlled diabetic population is 228 - 563 umol/L with a mean of 396 umol/L.   Marland Kitchen Sodium 12/03/2019 138  135 - 145 mEq/L Final  . Potassium 12/03/2019 4.2  3.5 - 5.1 mEq/L Final  . Chloride 12/03/2019 98  96 - 112 mEq/L Final  . CO2 12/03/2019 33* 19 - 32 mEq/L Final  . Glucose, Bld 12/03/2019 127* 70 - 99 mg/dL Final  . BUN 12/03/2019 16  6 - 23 mg/dL Final  . Creatinine, Ser 12/03/2019 0.80  0.40 - 1.20 mg/dL Final  . GFR 12/03/2019 83.73  >60.00 mL/min Final  . Calcium 12/03/2019 10.6* 8.4 - 10.5 mg/dL Final  . Hgb A1c MFr Bld 12/03/2019 6.8* 4.6 - 6.5 % Final   Glycemic Control Guidelines for People with Diabetes:Non Diabetic:  <6%Goal of Therapy: <7%Additional Action  Suggested:  >8%     Allergies as of 12/08/2019      Reactions   Ace Inhibitors Swelling, Other (See Comments)   Angioedema   Keflex [cephalexin] Swelling, Other (See Comments)   Lips became swollwn   Rifadin [rifampin] Swelling, Other (See Comments)   Lips became swollen      Medication List       Accurate as of December 08, 2019 10:03 AM. If you have any questions, ask your nurse or doctor.        Accu-Chek Aviva Plus w/Device Kit Use Accu Chek Aviva Plus meter to check blood sugar once daily. DX:E11.65   acetaminophen 325 MG tablet Commonly known as: TYLENOL Take 325-650 mg by mouth every 8 (eight) hours as needed (for headaches).   amLODipine 5 MG tablet Commonly known as: NORVASC Take 5 mg by mouth daily.   glucose blood test strip Use Accu chek aviva plus test strips as instructed to check blood sugar once daily.DX:E11.65   hydrochlorothiazide 25 MG tablet Commonly known as: HYDRODIURIL Take 25 mg by mouth daily.   hydrOXYzine 25 MG tablet Commonly known as: ATARAX/VISTARIL Take 25 mg by mouth 3 (three) times daily as needed.   metFORMIN 750 MG 24 hr tablet Commonly known as: GLUCOPHAGE-XR TAKE 2 TABLETS BY MOUTH ONCE DAILY WITH BREAKFAST   metoprolol succinate 50 MG 24 hr tablet Commonly known as: TOPROL-XL Take 50 mg by mouth daily.   simvastatin 20 MG tablet Commonly known as: ZOCOR Take 20 mg by mouth every evening.   SYSTANE OP Place 1 drop into both eyes 2 (two) times daily.   Vitamin D3 125 MCG (5000 UT) Caps Take 5,000 Units by mouth daily.       Allergies:  Allergies  Allergen Reactions  . Ace Inhibitors Swelling and Other (See Comments)    Angioedema   .  Keflex [Cephalexin] Swelling and Other (See Comments)    Lips became swollwn  . Rifadin [Rifampin] Swelling and Other (See Comments)    Lips became swollen    Past Medical History:  Diagnosis Date  . Acute lower GI bleeding 05/2019  . Anemia   . Cardiac mass    a. 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 Not seen on most recent TEE 2019.  . Cardiomyopathy in other disease   . Cataracts, bilateral   . Diabetes mellitus    Type 2  . Generalized osteoarthritis   . GERD (gastroesophageal reflux disease)   . HLD (hyperlipidemia)   . Hypertension   . Hypothyroidism   . LBBB (left bundle branch block)   . PAF (paroxysmal atrial fibrillation) ([place])    a. s/p DCCV 06/2017, on Xarelto  . Phlebitis   . S/P TAVR (transcatheter aortic valve replacement) 04/15/2018   Edwards Sapien 3 THV (size 23 mm, model # 9600TFX, serial # [id number]) via the TF approach  . Severe aortic stenosis    a. s/p TAVR 04/2018.  . Stroke [facility]) 2008    Past Surgical History:  Procedure Laterality Date  . ABDOMINAL HYSTERECTOMY    . BIOPSY  05/19/2019   Procedure: BIOPSY;  Surgeon: Ronald Lobo, MD;  Location: [place];  Service: Endoscopy;;  . COLONOSCOPY    . COLONOSCOPY WITH PROPOFOL N/A 10/02/2018   Procedure: COLONOSCOPY WITH PROPOFOL;  Surgeon: Arta Silence, MD;  Location: [place];  Service: Endoscopy;  Laterality: N/A;  . ENTEROSCOPY N/A 05/19/2019   Procedure: ENTEROSCOPY;  Surgeon: Ronald Lobo, MD;  Location: [facility] ENDOSCOPY;  Service: Endoscopy;  Laterality: N/A;  . ESOPHAGOGASTRODUODENOSCOPY (EGD) WITH PROPOFOL N/A 10/02/2018   Procedure: ESOPHAGOGASTRODUODENOSCOPY (EGD) WITH PROPOFOL;  Surgeon: Arta Silence, MD;  Location: [place];  Service: Endoscopy;  Laterality: N/A;  . ESOPHAGOGASTRODUODENOSCOPY (EGD) WITH PROPOFOL N/A 04/23/2019   Procedure: ESOPHAGOGASTRODUODENOSCOPY (EGD) WITH PROPOFOL;  Surgeon: Wonda Horner, MD;  Location: [facility] ENDOSCOPY;  Service: Endoscopy;  Laterality: N/A;  . EYE SURGERY Bilateral    cataract removal  . GIVENS CAPSULE STUDY N/A 10/02/2018   Procedure: GIVENS CAPSULE STUDY;  Surgeon: Arta Silence, MD;  Location: [facility] ENDOSCOPY;  Service: Endoscopy;  Laterality: N/A;  . RIGHT/LEFT HEART CATH AND CORONARY ANGIOGRAPHY N/A  03/06/2018   Procedure: RIGHT/LEFT HEART CATH AND CORONARY ANGIOGRAPHY;  Surgeon: Belva Crome, MD;  Location: [place] CV LAB;  Service: Cardiovascular;  Laterality: N/A;  . SMALL BOWEL ENTEROSCOPY  05/19/2019  . TEE WITHOUT CARDIOVERSION N/A 04/01/2018   Procedure: TRANSESOPHAGEAL ECHOCARDIOGRAM (TEE);  Surgeon: Jerline Pain, MD;  Location: [facility] ENDOSCOPY;  Service: Cardiovascular;  Laterality: N/A;  . TEE WITHOUT CARDIOVERSION N/A 04/15/2018   Procedure: TRANSESOPHAGEAL ECHOCARDIOGRAM (TEE);  Surgeon: Sherren Mocha, MD;  Location: [place];  Service: Open Heart Surgery;  Laterality: N/A;  . TOTAL KNEE ARTHROPLASTY Right 04/14/2013   Dr Marlou Sa  . TOTAL KNEE ARTHROPLASTY Right 04/14/2013   Procedure: TOTAL KNEE ARTHROPLASTY;  Surgeon: Meredith Pel, MD;  Location: [place];  Service: Orthopedics;  Laterality: Right;  . TRANSCATHETER AORTIC VALVE REPLACEMENT, TRANSFEMORAL  04/15/2018  . TRANSCATHETER AORTIC VALVE REPLACEMENT, TRANSFEMORAL Bilateral 04/15/2018   Procedure: TRANSCATHETER AORTIC VALVE REPLACEMENT, TRANSFEMORAL;  Surgeon: Sherren Mocha, MD;  Location: [place];  Service: Open Heart Surgery;  Laterality: Bilateral;    Family History  Problem Relation Age of Onset  . Stroke Mother   . Hypertension Mother   . Hypertension Father   . Heart attack Neg Hx  Social History:  reports that she has never smoked. She has never used smokeless tobacco. She reports that she does not drink alcohol or use drugs.  Review of Systems:  HYPERTENSION:  she has been managed with 5 mg amlodipine, 50 mg metoprolol and hydrochlorothiazide 25 mg, followed by PCP.  She had swelling of the lips with ramipril and has not been prescribed an ARB   HYPERLIPIDEMIA: The lipid abnormality consists of elevated LDL treated with simvastatin 20 mg by PCP and LDL is controlled   LDL as follows:   Lab Results  Component Value Date   CHOL 127 12/03/2019   HDL 34.60 (L) 12/03/2019   LDLCALC 73 12/03/2019    TRIG 98.0 12/03/2019   CHOLHDL 4 12/03/2019    Multinodular goiter:  She has had a long-standing multinodular goiter, last ultrasound was done in 05/2012 which showed the largest nodule to be 4.1 cm nodule in the isthmus Her exam has been showing no change She only has occasional difficulty with mucus not going down the throat but can swallow well  TSH has been normal  Lab Results  Component Value Date   TSH 2.94 12/03/2019    Last eye exam was in 12/2018, report not available  HYPERCALCEMIA: has had sporadic high readings Most recently calcium is 10.6 She does take 25 mg HCTZ PTH normal   Lab Results  Component Value Date   CALCIUM 10.6 (H) 12/03/2019   CALCIUM 10.2 08/03/2019   CALCIUM 9.3 05/19/2019   CALCIUM 9.2 05/18/2019   CALCIUM 9.8 05/17/2019    Lab Results  Component Value Date   PTH 23 08/22/2017   CALCIUM 10.6 (H) 12/03/2019   CAION 1.14 (L) 04/15/2018     Examination: BP 140/62 (BP Location: Left Arm, Patient Position: Sitting, Cuff Size: Large)   Pulse 77   Ht _0  (1.575 m)   Wt 194 lb 12.8 oz (88.4 kg)   SpO2 97%   BMI 35.63 kg/m   Body mass index is 35.63 kg/m.   Enlarged right thyroid lobe present, relatively lateral and posterior  Enlargement is about about 2-1/2-3 times normal, smooth and fleshy  Her isthmus has a large about 3 cm firm nodule, smooth and mobile Left lobe is enlarged about twice normal and smooth, slightly firm No stridor   ASSESSMENT/ PLAN:   Diabetes type 2 with obesity See history of present illness for discussion of current diabetes management, blood sugar patterns and problems identified  She is on Metformin only with consistently good control  A1c is 6.8, relatively higher but previously was falsely low because of anemia Fructosamine is also good at 250  Blood sugars at home and in the lab are looking fairly close to normal Considering her age and comorbid conditions her control is excellent and consistent  with her 8-year history of diabetes She has gained a little weight but has been less active Encouraged her to do upper body exercises Stay on Metformin unchanged Discussed checking more readings after dinner also  HYPERTENSION: Well controlled  Hypercalcemia: This is minimal and intermittent and likely related to HCTZ, previously has normal PTH of 23  LIPIDS: Excellent control with LDL 73  Multinodular goiter: The size of the goiter is unchanged compared to previous exams, has prominent midline nodule which previously was 4 cm on ultrasound and now measures relatively less on exam No local pressure symptoms again  She will follow-up in 4 months  Taimur Fier 12/08/2019, 10:03 AM   Note: This office  note was prepared with Estate agent. Any transcriptional errors that result from this process are unintentional.    Lab on 12/03/2019  Component Date Value Ref Range Status  . TSH 12/03/2019 2.94  0.35 - 4.50 uIU/mL Final  . Cholesterol 12/03/2019 127  0 - 200 mg/dL Final   ATP III Classification       Desirable:  < 200 mg/dL               Borderline High:  200 - 239 mg/dL          High:  > = 240 mg/dL  . Triglycerides 12/03/2019 98.0  0.0 - 149.0 mg/dL Final   Normal:  <150 mg/dLBorderline High:  150 - 199 mg/dL  . HDL 12/03/2019 34.60* >39.00 mg/dL Final  . VLDL 12/03/2019 19.6  0.0 - 40.0 mg/dL Final  . LDL Cholesterol 12/03/2019 73  0 - 99 mg/dL Final  . Total CHOL/HDL Ratio 12/03/2019 4   Final                  Men          Women1/2 Average Risk     3.4          3.3Average Risk          5.0          4.42X Average Risk          9.6          7.13X Average Risk          15.0          11.0                      . NonHDL 12/03/2019 92.81   Final   NOTE:  Non-HDL goal should be 30 mg/dL higher than patient's LDL goal (i.e. LDL goal of < 70 mg/dL, would have non-HDL goal of < 100 mg/dL)  . Fructosamine 12/03/2019 250  0 - 285 umol/L Final   Comment: Published  reference interval for apparently healthy subjects between age 84 and 72 is 63 - 285 umol/L and in a poorly controlled diabetic population is 228 - 563 umol/L with a mean of 396 umol/L.   Marland Kitchen Sodium 12/03/2019 138  135 - 145 mEq/L Final  . Potassium 12/03/2019 4.2  3.5 - 5.1 mEq/L Final  . Chloride 12/03/2019 98  96 - 112 mEq/L Final  . CO2 12/03/2019 33* 19 - 32 mEq/L Final  . Glucose, Bld 12/03/2019 127* 70 - 99 mg/dL Final  . BUN 12/03/2019 16  6 - 23 mg/dL Final  . Creatinine, Ser 12/03/2019 0.80  0.40 - 1.20 mg/dL Final  . GFR 12/03/2019 83.73  >60.00 mL/min Final  . Calcium 12/03/2019 10.6* 8.4 - 10.5 mg/dL Final  . Hgb A1c MFr Bld 12/03/2019 6.8* 4.6 - 6.5 % Final   Glycemic Control Guidelines for People with Diabetes:Non Diabetic:  <6%Goal of Therapy: <7%Additional Action Suggested:  >8%

## 2019-12-11 LAB — HM DIABETES EYE EXAM

## 2019-12-26 ENCOUNTER — Encounter (HOSPITAL_COMMUNITY): Payer: Self-pay | Admitting: Emergency Medicine

## 2019-12-26 ENCOUNTER — Emergency Department (HOSPITAL_COMMUNITY)
Admission: EM | Admit: 2019-12-26 | Discharge: 2019-12-26 | Disposition: A | Discharge status: Home / Self Care | Payer: Medicare Other | Attending: Emergency Medicine | Admitting: Emergency Medicine

## 2019-12-26 ENCOUNTER — Emergency Department (HOSPITAL_COMMUNITY): Payer: Medicare Other

## 2019-12-26 ENCOUNTER — Other Ambulatory Visit: Payer: Self-pay

## 2019-12-26 DIAGNOSIS — I10 Essential (primary) hypertension: Secondary | ICD-10-CM | POA: Insufficient documentation

## 2019-12-26 DIAGNOSIS — Z952 Presence of prosthetic heart valve: Secondary | ICD-10-CM | POA: Diagnosis not present

## 2019-12-26 DIAGNOSIS — Z79899 Other long term (current) drug therapy: Secondary | ICD-10-CM | POA: Insufficient documentation

## 2019-12-26 DIAGNOSIS — Z7984 Long term (current) use of oral hypoglycemic drugs: Secondary | ICD-10-CM | POA: Insufficient documentation

## 2019-12-26 DIAGNOSIS — E039 Hypothyroidism, unspecified: Secondary | ICD-10-CM | POA: Insufficient documentation

## 2019-12-26 DIAGNOSIS — Z8673 Personal history of transient ischemic attack (TIA), and cerebral infarction without residual deficits: Secondary | ICD-10-CM | POA: Insufficient documentation

## 2019-12-26 DIAGNOSIS — E119 Type 2 diabetes mellitus without complications: Secondary | ICD-10-CM | POA: Insufficient documentation

## 2019-12-26 DIAGNOSIS — Z96651 Presence of right artificial knee joint: Secondary | ICD-10-CM | POA: Insufficient documentation

## 2019-12-26 DIAGNOSIS — M545 Low back pain, unspecified: Secondary | ICD-10-CM

## 2019-12-26 MED ORDER — LIDOCAINE 5 % EX PTCH
1.0000 | MEDICATED_PATCH | CUTANEOUS | Status: DC
  Administered 2019-12-26: 18:00:00 1 via TRANSDERMAL
  Filled 2019-12-26: qty 1

## 2019-12-26 MED ORDER — DICLOFENAC EPOLAMINE 1.3 % EX PTCH: 1.0000 | MEDICATED_PATCH | Freq: Two times a day (BID) | CUTANEOUS | 0 refills | Status: DC

## 2019-12-26 NOTE — ED Triage Notes (Signed)
Pt dropped a pill this morning and bent over to pick it up.  Ever since then she has L sided lower back pain with movement.  No pain with sitting still.

## 2019-12-26 NOTE — ED Notes (Signed)
PT reports the lower back pain makes it hard to walk.

## 2019-12-26 NOTE — ED Provider Notes (Signed)
[facility] EMERGENCY DEPARTMENT Provider Note   CSN: [id number] Arrival date & time: 12/26/19  1631     History Chief Complaint  Patient presents with  . Back Pain    Janet Williamson is a 79 y.o. female.  Janet Williamson is a 79 year old woman who presents to the ED with several hours of lower back pain.  Her previous medical history is noncontributory to this encounter.  She reports that she was sitting in a chair earlier today when she dropped a pill and bent to pick up.  On sitting back up, she experienced a very painful shooting pain in her middle back radiating to the left side.  This pain is roughly 8-9/10 severity and recurs with certain movements.  If she lies perfectly still, she does not experience any pain but with any rotation or walking or movement she has intense pain again.  She came to the emergency room today to be assessed.  She has a follow-up appointment with her primary care provider scheduled for 3/22.        Past Medical History:  Diagnosis Date  . Acute lower GI bleeding 05/2019  . Anemia   . Cardiac mass    a. 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 Not seen on most recent TEE 2019.  . Cardiomyopathy in other disease   . Cataracts, bilateral   . Diabetes mellitus    Type 2  . Generalized osteoarthritis   . GERD (gastroesophageal reflux disease)   . HLD (hyperlipidemia)   . Hypertension   . Hypothyroidism   . LBBB (left bundle branch block)   . PAF (paroxysmal atrial fibrillation) ([place])    a. s/p DCCV 06/2017, on Xarelto  . Phlebitis   . S/P TAVR (transcatheter aortic valve replacement) 04/15/2018   Edwards Sapien 3 THV (size 23 mm, model # 9600TFX, serial # [id number]) via the TF approach  . Severe aortic stenosis    a. s/p TAVR 04/2018.  . Stroke [facility]) 2008    Patient Active Problem List   Diagnosis Date Noted  . GI bleed 04/21/2019  . Symptomatic anemia 09/30/2018  . S/P TAVR (transcatheter aortic valve replacement)  04/15/2018  . Severe aortic stenosis   . PAF (paroxysmal atrial fibrillation) ([place]) 03/04/2018  . History of stroke 11/03/2014  . Hypertension   . DM (diabetes mellitus) ([place]) 05/06/2013  . HTN (hypertension) 05/06/2013  . Anemia 05/06/2013    Past Surgical History:  Procedure Laterality Date  . ABDOMINAL HYSTERECTOMY    . BIOPSY  05/19/2019   Procedure: BIOPSY;  Surgeon: Ronald Lobo, MD;  Location: [place];  Service: Endoscopy;;  . COLONOSCOPY    . COLONOSCOPY WITH PROPOFOL N/A 10/02/2018   Procedure: COLONOSCOPY WITH PROPOFOL;  Surgeon: Arta Silence, MD;  Location: [place];  Service: Endoscopy;  Laterality: N/A;  . ENTEROSCOPY N/A 05/19/2019   Procedure: ENTEROSCOPY;  Surgeon: Ronald Lobo, MD;  Location: [facility] ENDOSCOPY;  Service: Endoscopy;  Laterality: N/A;  . ESOPHAGOGASTRODUODENOSCOPY (EGD) WITH PROPOFOL N/A 10/02/2018   Procedure: ESOPHAGOGASTRODUODENOSCOPY (EGD) WITH PROPOFOL;  Surgeon: Arta Silence, MD;  Location: [place];  Service: Endoscopy;  Laterality: N/A;  . ESOPHAGOGASTRODUODENOSCOPY (EGD) WITH PROPOFOL N/A 04/23/2019   Procedure: ESOPHAGOGASTRODUODENOSCOPY (EGD) WITH PROPOFOL;  Surgeon: Wonda Horner, MD;  Location: [facility] ENDOSCOPY;  Service: Endoscopy;  Laterality: N/A;  . EYE SURGERY Bilateral    cataract removal  . GIVENS CAPSULE STUDY N/A 10/02/2018   Procedure: GIVENS CAPSULE STUDY;  Surgeon: Arta Silence, MD;  Location: [facility]  ENDOSCOPY;  Service: Endoscopy;  Laterality: N/A;  . RIGHT/LEFT HEART CATH AND CORONARY ANGIOGRAPHY N/A 03/06/2018   Procedure: RIGHT/LEFT HEART CATH AND CORONARY ANGIOGRAPHY;  Surgeon: Belva Crome, MD;  Location: [place] CV LAB;  Service: Cardiovascular;  Laterality: N/A;  . SMALL BOWEL ENTEROSCOPY  05/19/2019  . TEE WITHOUT CARDIOVERSION N/A 04/01/2018   Procedure: TRANSESOPHAGEAL ECHOCARDIOGRAM (TEE);  Surgeon: Jerline Pain, MD;  Location: [facility] ENDOSCOPY;  Service: Cardiovascular;  Laterality: N/A;  . TEE  WITHOUT CARDIOVERSION N/A 04/15/2018   Procedure: TRANSESOPHAGEAL ECHOCARDIOGRAM (TEE);  Surgeon: Sherren Mocha, MD;  Location: [place];  Service: Open Heart Surgery;  Laterality: N/A;  . TOTAL KNEE ARTHROPLASTY Right 04/14/2013   Dr Marlou Sa  . TOTAL KNEE ARTHROPLASTY Right 04/14/2013   Procedure: TOTAL KNEE ARTHROPLASTY;  Surgeon: Meredith Pel, MD;  Location: [place];  Service: Orthopedics;  Laterality: Right;  . TRANSCATHETER AORTIC VALVE REPLACEMENT, TRANSFEMORAL  04/15/2018  . TRANSCATHETER AORTIC VALVE REPLACEMENT, TRANSFEMORAL Bilateral 04/15/2018   Procedure: TRANSCATHETER AORTIC VALVE REPLACEMENT, TRANSFEMORAL;  Surgeon: Sherren Mocha, MD;  Location: [place];  Service: Open Heart Surgery;  Laterality: Bilateral;     OB History   No obstetric history on file.     Family History  Problem Relation Age of Onset  . Stroke Mother   . Hypertension Mother   . Hypertension Father   . Heart attack Neg Hx     Social History   Tobacco Use  . Smoking status: Never Smoker  . Smokeless tobacco: Never Used  Substance Use Topics  . Alcohol use: No  . Drug use: No    Home Medications Prior to Admission medications   Medication Sig Start Date End Date Taking? Authorizing Provider  acetaminophen (TYLENOL) 325 MG tablet Take 325-650 mg by mouth every 8 (eight) hours as needed (for headaches).     [provider]  amLODipine (NORVASC) 5 MG tablet Take 5 mg by mouth daily.    [provider]  Blood Glucose Monitoring Suppl (ACCU-CHEK AVIVA PLUS) w/Device KIT Use Accu Chek Aviva Plus meter to check blood sugar once daily. DX:E11.65 07/28/19   Elayne Snare, MD  Cholecalciferol (VITAMIN D3) 5000 units CAPS Take 5,000 Units by mouth daily.    [provider]  glucose blood test strip Use Accu chek aviva plus test strips as instructed to check blood sugar once daily.DX:E11.65 07/31/19   Elayne Snare, MD  hydrochlorothiazide (HYDRODIURIL) 25 MG tablet Take 25 mg by mouth  daily.     [provider]  hydrOXYzine (ATARAX/VISTARIL) 25 MG tablet Take 25 mg by mouth 3 (three) times daily as needed. 08/03/19   [provider]  metFORMIN (GLUCOPHAGE-XR) 750 MG 24 hr tablet TAKE 2 TABLETS BY MOUTH ONCE DAILY WITH BREAKFAST 11/25/19   Elayne Snare, MD  metoprolol succinate (TOPROL-XL) 50 MG 24 hr tablet Take 50 mg by mouth daily.     [provider]  Polyethyl Glycol-Propyl Glycol (SYSTANE OP) Place 1 drop into both eyes 2 (two) times daily.     [provider]  simvastatin (ZOCOR) 20 MG tablet Take 20 mg by mouth every evening.    [provider]    Allergies    Ace inhibitors, Keflex [cephalexin], and Rifadin [rifampin]  Review of Systems   Review of Systems  Constitutional: Negative for fatigue and fever.  HENT: Negative for congestion and sore throat.   Eyes: Negative for visual disturbance.  Respiratory: Negative for chest tightness and shortness of breath.  Cardiovascular: Negative for chest pain, palpitations and leg swelling.  Gastrointestinal: Negative for abdominal pain, diarrhea, nausea and vomiting.  Endocrine: Negative for polyuria.  Genitourinary: Negative for dysuria and frequency.  Musculoskeletal: Positive for back pain. Negative for arthralgias.  Skin: Negative for wound.  Neurological: Negative for dizziness, weakness, numbness and headaches.    Physical Exam Updated Vital Signs BP (!) 128/59 (BP Location: Right Arm)   Pulse 66   Temp 98.3 F (36.8 C) (Oral)   Resp 18   SpO2 97%   Physical Exam Constitutional:      Appearance: She is normal weight.  HENT:     Head: Normocephalic and atraumatic.     Nose: Nose normal.     Mouth/Throat:     Mouth: Mucous membranes are moist.     Pharynx: Oropharynx is clear.  Eyes:     Extraocular Movements: Extraocular movements intact.     Conjunctiva/sclera: Conjunctivae normal.     Pupils: Pupils are equal, round, and reactive to light.    Cardiovascular:     Rate and Rhythm: Normal rate and regular rhythm.     Pulses: Normal pulses.     Heart sounds: Normal heart sounds.  Pulmonary:     Effort: Pulmonary effort is normal.     Breath sounds: Normal breath sounds.  Abdominal:     General: Abdomen is flat. Bowel sounds are normal. There is no distension.     Palpations: Abdomen is soft.     Tenderness: There is no abdominal tenderness.  Musculoskeletal:        General: No swelling, tenderness or deformity.     Cervical back: Normal range of motion and neck supple. No rigidity.     Right lower leg: No edema.     Left lower leg: No edema.     Comments: reproducible pain with spinal extension. No TTP of paraspinal muscles or pain with percussion of vertebrae.  Neg straight leg raise.  Skin:    General: Skin is warm and dry.  Neurological:     General: No focal deficit present.     Mental Status: She is alert and oriented to person, place, and time. Mental status is at baseline.     Cranial Nerves: No cranial nerve deficit.     Motor: No weakness.     Gait: Gait abnormal (shuffling gait).  Psychiatric:        Mood and Affect: Mood normal.     ED Results / Procedures / Treatments   Labs (all labs ordered are listed, but only abnormal results are displayed) Labs Reviewed - No data to display  EKG None  Radiology No results found.  Procedures Procedures (including critical care time)  Medications Ordered in ED Medications  lidocaine (LIDODERM) 5 % 1 patch (has no administration in time range)    ED Course  I have reviewed the triage vital signs and the nursing notes.  Pertinent labs & imaging results that were available during my care of the patient were reviewed by me and considered in my medical decision making (see chart for details).    MDM Rules/Calculators/A&P                      Ms. Gabbert is a 79 year old woman who presents to the emergency room with new onset low back pain.  The  differential for her low back pain includes a strain paraspinal muscle, disc herniation, compression fracture.  We will follow-up with a lumbar  spine x-ray to assess for possible compression fracture.  Will likely send home with pain control and follow-up with her PCP.  X-ray of the lumbar spine shows no evidence of compression fracture.  She is discharged with diclofenac patches and she was encouraged to follow-up with her PCP. Final Clinical Impression(s) / ED Diagnoses Final diagnoses:  None    Rx / DC Orders ED Discharge Orders    None       Matilde Haymaker, MD 12/26/19 Paulette Blanch    Carmin Muskrat, MD 12/26/19 2320

## 2019-12-26 NOTE — ED Notes (Signed)
Patient verbalizes understanding of discharge instructions . Opportunity for questions and answers were provided . Armband removed by staff ,Pt discharged from ED. W/C  offered at D/C  and Declined W/C at D/C and was escorted to lobby by RN.  

## 2019-12-26 NOTE — Discharge Instructions (Addendum)
You came to the ED today for new onset back pain starting today.  Based on the lab work and imaging we did today, it seems like this back pain is most likely musculoskeletal.  You do not need any further work-up here in the emergency department today.  I suspect that this pain will significantly improve in the next 1 to 2 weeks.  It will be very important for you to have regular follow-up with your primary care physician for continued monitoring and possible pain control.  I have prescribed you diclofenac patches which she can put directly over the painful area of your back.  This will hopefully provide substantial pain relief while you wait for your body to heal.

## 2020-01-20 ENCOUNTER — Ambulatory Visit: Payer: Self-pay

## 2020-01-20 ENCOUNTER — Other Ambulatory Visit: Payer: Self-pay

## 2020-01-20 ENCOUNTER — Encounter: Payer: Self-pay | Admitting: Orthopedic Surgery

## 2020-01-20 ENCOUNTER — Ambulatory Visit (INDEPENDENT_AMBULATORY_CARE_PROVIDER_SITE_OTHER): Payer: Medicare Other | Admitting: Orthopedic Surgery

## 2020-01-20 VITALS — Ht 64.0 in | Wt 195.0 lb

## 2020-01-20 DIAGNOSIS — M25562 Pain in left knee: Secondary | ICD-10-CM

## 2020-01-20 DIAGNOSIS — M1712 Unilateral primary osteoarthritis, left knee: Secondary | ICD-10-CM

## 2020-01-22 ENCOUNTER — Encounter: Payer: Self-pay | Admitting: Orthopedic Surgery

## 2020-01-22 DIAGNOSIS — M1712 Unilateral primary osteoarthritis, left knee: Secondary | ICD-10-CM | POA: Diagnosis not present

## 2020-01-22 MED ORDER — METHYLPREDNISOLONE ACETATE 40 MG/ML IJ SUSP
40.0000 mg | INTRAMUSCULAR | Status: AC | PRN
  Administered 2020-01-22: 40 mg via INTRA_ARTICULAR

## 2020-01-22 MED ORDER — LIDOCAINE HCL 1 % IJ SOLN
5.0000 mL | INTRAMUSCULAR | Status: AC | PRN
  Administered 2020-01-22: 5 mL

## 2020-01-22 MED ORDER — BUPIVACAINE HCL 0.25 % IJ SOLN
4.0000 mL | INTRAMUSCULAR | Status: AC | PRN
  Administered 2020-01-22: 4 mL via INTRA_ARTICULAR

## 2020-01-22 NOTE — Progress Notes (Signed)
Office Visit Note   Patient: Janet Williamson           Date of Birth: October 15, 1940           MRN: [id number] Visit Date: 01/20/2020 Requested by: Kelton Pillar, MD [place] [address] [place],  [place] [address] PCP: Kelton Pillar, MD  Subjective: Chief Complaint  Patient presents with  . Left Knee - Pain    HPI: Janet Williamson is a 79 y.o. female who presents to the office complaining of left knee pain.  She notes pain in the left knee for several years.  She has previous Right total knee arthroplasty several years ago and did well with that..  She complains of swelling and instability in the left knee.  Denies any mechanical symptoms.  Denies groin pain or radicular leg pain.  Takes tylenol with some relief.  Localizes pain to the medial aspect.  .                ROS:  All systems reviewed are negative as they relate to the chief complaint within the history of present illness.  Patient denies  fevers or chills.   Assessment & Plan: Visit Diagnoses:  1. Left knee pain, unspecified chronicity     Plan: Patient is a 79 y.o. Female who presents complaining of left knee pain. History of knee arthritis. She has had previous right TKA.  Wants to avoid surgery on the left knee for as long as possible.  Radiographs reveal significant knee arthritis of the left knee, worst in the medial compartment.  Discussed options.  Decided on left knee cortisone injection today. Patient tolerated the procedure well.  Considered RX'ing Mobic for pain relief but patient has a history of stroke so decided against this.    Follow-Up Instructions: No follow-ups on file.   Orders:  Orders Placed This Encounter  Procedures  . XR Knee 1-2 Views Left   No orders of the defined types were placed in this encounter.     Procedures: Large Joint Inj: L knee on 01/22/2020 4:36 PM Indications: diagnostic evaluation, joint swelling and pain Details: 18 G 1.5 in needle, superolateral  approach  Arthrogram: No  Medications: 5 mL lidocaine 1 %; 40 mg methylPREDNISolone acetate 40 MG/ML; 4 mL bupivacaine 0.25 % Outcome: tolerated well, no immediate complications Procedure, treatment alternatives, risks and benefits explained, specific risks discussed. Consent was given by the patient. Immediately prior to procedure a time out was called to verify the correct patient, procedure, equipment, support staff and site/side marked as required. Patient was prepped and draped in the usual sterile fashion.       Clinical Data: No additional findings.  Objective: Vital Signs: Ht 5\' 4"  (1.626 m)   Wt 195 lb (88.5 kg)   BMI 33.47 kg/m   Physical Exam:    Constitutional: Patient appears well-developed HEENT:  Head: Normocephalic Eyes:EOM are normal Neck: Normal range of motion Cardiovascular: Normal rate Pulmonary/chest: Effort normal Neurologic: Patient is alert Skin: Skin is warm Psychiatric: Patient has normal mood and affect    Ortho Exam:  Left knee Mild medial joint line TTP No effusion Extensor mechanism intact No TTP over the lateral jointlines, quad tendon, patellar tendon, pes anserinus, patella, tibial tubercle, LCL/MCL insertions Stable to varus/valgus stresses.  Stable to anterior/posterior drawer Extension to 0 degrees Flexion > 90 degrees   Specialty Comments:  No specialty comments available.  Imaging: No results found.   PMFS History: Patient Active  Problem List   Diagnosis Date Noted  . GI bleed 04/21/2019  . Symptomatic anemia 09/30/2018  . S/P TAVR (transcatheter aortic valve replacement) 04/15/2018  . Severe aortic stenosis   . PAF (paroxysmal atrial fibrillation) ([facility]) 03/04/2018  . History of stroke 11/03/2014  . Hypertension   . DM (diabetes mellitus) ([facility]) 05/06/2013  . HTN (hypertension) 05/06/2013  . Anemia 05/06/2013   Past Medical History:  Diagnosis Date  . Acute lower GI bleeding 05/2019  . Anemia   . Cardiac  mass    a. 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 Not seen on most recent TEE 2019.  . Cardiomyopathy in other disease   . Cataracts, bilateral   . Diabetes mellitus    Type 2  . Generalized osteoarthritis   . GERD (gastroesophageal reflux disease)   . HLD (hyperlipidemia)   . Hypertension   . Hypothyroidism   . LBBB (left bundle branch block)   . PAF (paroxysmal atrial fibrillation) ([facility])    a. s/p DCCV 06/2017, on Xarelto  . Phlebitis   . S/P TAVR (transcatheter aortic valve replacement) 04/15/2018   Edwards Sapien 3 THV (size 23 mm, model # 9600TFX, serial # [id number]) via the TF approach  . Severe aortic stenosis    a. s/p TAVR 04/2018.  . Stroke [facility]) 2008    Family History  Problem Relation Age of Onset  . Stroke Mother   . Hypertension Mother   . Hypertension Father   . Heart attack Neg Hx     Past Surgical History:  Procedure Laterality Date  . ABDOMINAL HYSTERECTOMY    . BIOPSY  05/19/2019   Procedure: BIOPSY;  Surgeon: Bernette Redbird, MD;  Location: [facility] ENDOSCOPY;  Service: Endoscopy;;  . COLONOSCOPY    . COLONOSCOPY WITH PROPOFOL N/A 10/02/2018   Procedure: COLONOSCOPY WITH PROPOFOL;  Surgeon: [name], MD;  Location: [facility] ENDOSCOPY;  Service: Endoscopy;  Laterality: N/A;  . ENTEROSCOPY N/A 05/19/2019   Procedure: ENTEROSCOPY;  Surgeon: Bernette Redbird, MD;  Location: [facility] ENDOSCOPY;  Service: Endoscopy;  Laterality: N/A;  . ESOPHAGOGASTRODUODENOSCOPY (EGD) WITH PROPOFOL N/A 10/02/2018   Procedure: ESOPHAGOGASTRODUODENOSCOPY (EGD) WITH PROPOFOL;  Surgeon: [name], MD;  Location: [facility] ENDOSCOPY;  Service: Endoscopy;  Laterality: N/A;  . ESOPHAGOGASTRODUODENOSCOPY (EGD) WITH PROPOFOL N/A 04/23/2019   Procedure: ESOPHAGOGASTRODUODENOSCOPY (EGD) WITH PROPOFOL;  Surgeon: Graylin Shiver, MD;  Location: [facility] ENDOSCOPY;  Service: Endoscopy;  Laterality: N/A;  . EYE SURGERY Bilateral    cataract removal  . GIVENS CAPSULE STUDY N/A 10/02/2018   Procedure: GIVENS  CAPSULE STUDY;  Surgeon: [name], MD;  Location: [facility] ENDOSCOPY;  Service: Endoscopy;  Laterality: N/A;  . RIGHT/LEFT HEART CATH AND CORONARY ANGIOGRAPHY N/A 03/06/2018   Procedure: RIGHT/LEFT HEART CATH AND CORONARY ANGIOGRAPHY;  Surgeon: Lyn Records, MD;  Location: [facility] INVASIVE CV LAB;  Service: Cardiovascular;  Laterality: N/A;  . SMALL BOWEL ENTEROSCOPY  05/19/2019  . TEE WITHOUT CARDIOVERSION N/A 04/01/2018   Procedure: TRANSESOPHAGEAL ECHOCARDIOGRAM (TEE);  Surgeon: Jake Bathe, MD;  Location: [facility] ENDOSCOPY;  Service: Cardiovascular;  Laterality: N/A;  . TEE WITHOUT CARDIOVERSION N/A 04/15/2018   Procedure: TRANSESOPHAGEAL ECHOCARDIOGRAM (TEE);  Surgeon: Tonny Bollman, MD;  Location: [facility] OR;  Service: Open Heart Surgery;  Laterality: N/A;  . TOTAL KNEE ARTHROPLASTY Right 04/14/2013   Dr August Saucer  . TOTAL KNEE ARTHROPLASTY Right 04/14/2013   Procedure: TOTAL KNEE ARTHROPLASTY;  Surgeon: Cammy Copa, MD;  Location: [facility] OR;  Service: Orthopedics;  Laterality: Right;  . TRANSCATHETER AORTIC VALVE  REPLACEMENT, TRANSFEMORAL  04/15/2018  . TRANSCATHETER AORTIC VALVE REPLACEMENT, TRANSFEMORAL Bilateral 04/15/2018   Procedure: TRANSCATHETER AORTIC VALVE REPLACEMENT, TRANSFEMORAL;  Surgeon: Tonny Bollman, MD;  Location: [facility] OR;  Service: Open Heart Surgery;  Laterality: Bilateral;   Social History   Occupational History  . Not on file  Tobacco Use  . Smoking status: Never Smoker  . Smokeless tobacco: Never Used  Substance and Sexual Activity  . Alcohol use: No  . Drug use: No  . Sexual activity: Not Currently

## 2020-02-15 ENCOUNTER — Other Ambulatory Visit: Payer: Self-pay

## 2020-02-15 ENCOUNTER — Encounter: Payer: Self-pay | Admitting: Cardiology

## 2020-02-15 ENCOUNTER — Ambulatory Visit (INDEPENDENT_AMBULATORY_CARE_PROVIDER_SITE_OTHER): Payer: Medicare HMO | Admitting: Cardiology

## 2020-02-15 VITALS — BP 120/70 | HR 67 | Ht 64.0 in | Wt 195.0 lb

## 2020-02-15 DIAGNOSIS — Z952 Presence of prosthetic heart valve: Secondary | ICD-10-CM | POA: Diagnosis not present

## 2020-02-15 DIAGNOSIS — I447 Left bundle-branch block, unspecified: Secondary | ICD-10-CM | POA: Diagnosis not present

## 2020-02-15 DIAGNOSIS — I480 Paroxysmal atrial fibrillation: Secondary | ICD-10-CM | POA: Diagnosis not present

## 2020-02-15 DIAGNOSIS — I10 Essential (primary) hypertension: Secondary | ICD-10-CM

## 2020-02-15 NOTE — Patient Instructions (Signed)
Medication Instructions:  The current medical regimen is effective;  continue present plan and medications.  *If you need a refill on your cardiac medications before your next appointment, please call your pharmacy*  Follow-Up: At [facility], you and your health needs are our priority.  As part of our continuing mission to provide you with exceptional heart care, we have created designated Provider Care Teams.  These Care Teams include your primary Cardiologist (physician) and Advanced Practice Providers (APPs -  Physician Assistants and Nurse Practitioners) who all work together to provide you with the care you need, when you need it.  We recommend signing up for the patient portal called "MyChart".  Sign up information is provided on this After Visit Summary.  MyChart is used to connect with patients for Virtual Visits (Telemedicine).  Patients are able to view lab/test results, encounter notes, upcoming appointments, etc.  Non-urgent messages can be sent to your provider as well.   To learn more about what you can do with MyChart, go to [email or web address].    Your next appointment:   6 month(s)  The format for your next appointment:   In Person  Provider:   Lori Gerhardt, NP and 1 yr with Dr Skains.  Thank you for choosing [facility]!!     

## 2020-02-15 NOTE — Progress Notes (Signed)
Cardiology Office Note:    Date:  02/15/2020   ID:  Janet Williamson, DOB September 29, 1941, MRN [id number]  PCP:  Kelton Pillar, MD  Cardiologist:  [name], MD  Electrophysiologist:  None   Referring MD: Kelton Pillar, MD     History of Present Illness:    Janet Williamson is a 79 y.o. female here for follow-up of TAVR July 2019, left bundle branch block mitral annular calcification diabetes PAF on Xarelto.  Prior embolic stroke 2248  Overall she feels well today.  She does state that she occasionally will have a little right sided posterior neck pain when turning her neck in a certain direction.  Explained musculoskeletal nature of this.  She has not had any further melena no bleeding episodes no chest pain no significant shortness of breath.  Echocardiogram performed last year showed elevated but stable transaortic gradients.  In review of Janet Williamson's last note:She was seen by Dr. Cyndia Bent in 2008 when she presented with an embolic stroke and was found to have a mass on her mitral valve.It was unclear if this was a fibroblastoma or papilloma or possibly dystrophic calcification and we decided to follow it. She has been followed by Dr. Marlou Porch since that and has done well but over the past year has developed progressive shortness of breath and fatigue with activity.She had an echo on 02/26/2018 showing progression to severe aortic stenosis with a mean gradient of 47 mmHg and a peak gradient of 71 mmHg. The dimensionless index was 0.34. Left ventricular ejection fraction was 55 to 60%.She underwent cardiac catheterization on 03/06/2018 which showed no significant coronary disease. The peak to peak gradient across the aortic valve was 32 mmHg. She was evaluated by Dr. Burt Knack and a repeat TEE was performed to evaluate the mitral valve given her history of a mitral valve mass. This showed no evidence of mitral valve mass or vegetation. There is mild regurgitation. The leaflets were mildly  thickened with mild mitral annular calcification. The aortic valve was severely calcified and restricted with a mean gradient of 36 mmHg.   She underwentsuccessful TAVR with a33m Edwards Sapien 3 THV via the TF approach on 04/15/18. Post operative echo showed higher than expected transvalvular aortic gradients likely related to patient-prosthesis mismatch with a mean gradient of 22 mmHgwithno paravalvular regurgitation. She was discharged the following day on Asprin 81 mg daily and home Xarelto .1 month echo showedEF 55% with normally functioning TAVR with no PVL and mean gradient 239mHg.  She was admitted in 09/2018 for GI bleed. Hg 5.2 and she was transfused. Colonoscopy was normal. EGD showed non bleeding erosive gastropathy. Capsule endoscopy showed duodenal ulcer. She was started on a PPI and aspirin discontinued. Follow up CBC showed Hg 9.6.  [place] and had had follow up echo - some mild dizziness noted but otherwise, seemed to be ok. I then saw her for a virtual visit in September - she had been admitted with recurrent GI bleed - ended up having repeat EGD and found to have posible proximal hemorrhagic gastritis but no ulcer - left on PPI and Carafate - Xarelto held for one week - then discussion for possibly lowering of the dose. She was doing better at my visit.   Last seen by Dr. SkMarlou Porchn October - decision that risk of bleeding outweighed the use of Xarelto and this was not restarted.   The patient does not have symptoms concerning for  COVID-19 infection (fever, chills, cough, or new shortness of breath).   Comes in today. Here alone. She had her first COVID vaccine this past Sunday at her church. Little nagging headache in the mornings - gets better as the day goes on - otherwise, feels good. She has moved out of her daughter's house - now has her own place and she does all her own housework/laundry/etc.  No chest pain.  Not short of breath. No bleeding noted. BP is good. She had lab back in October with PCP. She feels ok on her medicines and overall has no real concerns.     Past Medical History:  Diagnosis Date  . Acute lower GI bleeding 05/2019  . Anemia   . Cardiac mass    a. 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 Not seen on most recent TEE 2019.  . Cardiomyopathy in other disease   . Cataracts, bilateral   . Diabetes mellitus    Type 2  . Generalized osteoarthritis   . GERD (gastroesophageal reflux disease)   . HLD (hyperlipidemia)   . Hypertension   . Hypothyroidism   . LBBB (left bundle branch block)   . PAF (paroxysmal atrial fibrillation) ([place])    a. s/p DCCV 06/2017, on Xarelto  . Phlebitis   . S/P TAVR (transcatheter aortic valve replacement) 04/15/2018   Edwards Sapien 3 THV (size 23 mm, model # 9600TFX, serial # [id number]) via the TF approach  . Severe aortic stenosis    a. s/p TAVR 04/2018.  . Stroke [facility]) 2008    Past Surgical History:  Procedure Laterality Date  . ABDOMINAL HYSTERECTOMY    . BIOPSY  05/19/2019   Procedure: BIOPSY;  Surgeon: Ronald Lobo, MD;  Location: [place];  Service: Endoscopy;;  . COLONOSCOPY    . COLONOSCOPY WITH PROPOFOL N/A 10/02/2018   Procedure: COLONOSCOPY WITH PROPOFOL;  Surgeon: Arta Silence, MD;  Location: [place];  Service: Endoscopy;  Laterality: N/A;  . ENTEROSCOPY N/A 05/19/2019   Procedure: ENTEROSCOPY;  Surgeon: Ronald Lobo, MD;  Location: [facility] ENDOSCOPY;  Service: Endoscopy;  Laterality: N/A;  . ESOPHAGOGASTRODUODENOSCOPY (EGD) WITH PROPOFOL N/A 10/02/2018   Procedure: ESOPHAGOGASTRODUODENOSCOPY (EGD) WITH PROPOFOL;  Surgeon: Arta Silence, MD;  Location: [place];  Service: Endoscopy;  Laterality: N/A;  . ESOPHAGOGASTRODUODENOSCOPY (EGD) WITH PROPOFOL N/A 04/23/2019   Procedure: ESOPHAGOGASTRODUODENOSCOPY (EGD) WITH PROPOFOL;  Surgeon: Wonda Horner, MD;  Location: [facility] ENDOSCOPY;  Service: Endoscopy;  Laterality:  N/A;  . EYE SURGERY Bilateral    cataract removal  . GIVENS CAPSULE STUDY N/A 10/02/2018   Procedure: GIVENS CAPSULE STUDY;  Surgeon: Arta Silence, MD;  Location: [facility] ENDOSCOPY;  Service: Endoscopy;  Laterality: N/A;  . RIGHT/LEFT HEART CATH AND CORONARY ANGIOGRAPHY N/A 03/06/2018   Procedure: RIGHT/LEFT HEART CATH AND CORONARY ANGIOGRAPHY;  Surgeon: Belva Crome, MD;  Location: [place] CV LAB;  Service: Cardiovascular;  Laterality: N/A;  . SMALL BOWEL ENTEROSCOPY  05/19/2019  . TEE WITHOUT CARDIOVERSION N/A 04/01/2018   Procedure: TRANSESOPHAGEAL ECHOCARDIOGRAM (TEE);  Surgeon: Jerline Pain, MD;  Location: [facility] ENDOSCOPY;  Service: Cardiovascular;  Laterality: N/A;  . TEE WITHOUT CARDIOVERSION N/A 04/15/2018   Procedure: TRANSESOPHAGEAL ECHOCARDIOGRAM (TEE);  Surgeon: Sherren Mocha, MD;  Location: [place];  Service: Open Heart Surgery;  Laterality: N/A;  . TOTAL KNEE ARTHROPLASTY Right 04/14/2013   Dr Marlou Sa  . TOTAL KNEE ARTHROPLASTY Right 04/14/2013   Procedure: TOTAL KNEE ARTHROPLASTY;  Surgeon: Meredith Pel, MD;  Location: [place];  Service: Orthopedics;  Laterality: Right;  .  TRANSCATHETER AORTIC VALVE REPLACEMENT, TRANSFEMORAL  04/15/2018  . TRANSCATHETER AORTIC VALVE REPLACEMENT, TRANSFEMORAL Bilateral 04/15/2018   Procedure: TRANSCATHETER AORTIC VALVE REPLACEMENT, TRANSFEMORAL;  Surgeon: Sherren Mocha, MD;  Location: [place];  Service: Open Heart Surgery;  Laterality: Bilateral;    Current Medications: Current Meds  Medication Sig  . acetaminophen (TYLENOL) 325 MG tablet Take 325-650 mg by mouth every 8 (eight) hours as needed (for headaches).   Marland Kitchen amLODipine (NORVASC) 5 MG tablet Take 5 mg by mouth daily.  . Blood Glucose Monitoring Suppl (ACCU-CHEK AVIVA PLUS) w/Device KIT Use Accu Chek Aviva Plus meter to check blood sugar once daily. DX:E11.65  . Cholecalciferol (VITAMIN D3) 5000 units CAPS Take 5,000 Units by mouth daily.  . diclofenac (FLECTOR) 1.3 % PTCH Place 1 patch  onto the skin 2 (two) times daily.  Marland Kitchen glucose blood test strip Use Accu chek aviva plus test strips as instructed to check blood sugar once daily.DX:E11.65  . hydrochlorothiazide (HYDRODIURIL) 25 MG tablet Take 25 mg by mouth daily.   . hydrOXYzine (ATARAX/VISTARIL) 25 MG tablet Take 25 mg by mouth 3 (three) times daily as needed.  . metFORMIN (GLUCOPHAGE-XR) 750 MG 24 hr tablet TAKE 2 TABLETS BY MOUTH ONCE DAILY WITH BREAKFAST  . metoprolol succinate (TOPROL-XL) 50 MG 24 hr tablet Take 50 mg by mouth daily.   Vladimir Faster Glycol-Propyl Glycol (SYSTANE OP) Place 1 drop into both eyes 2 (two) times daily.   . simvastatin (ZOCOR) 20 MG tablet Take 20 mg by mouth every evening.     Allergies:   Ace inhibitors, Keflex [cephalexin], and Rifadin [rifampin]   Social History   Socioeconomic History  . Marital status: Widowed    Spouse name: Not on file  . Number of children: Not on file  . Years of education: Not on file  . Highest education level: Not on file  Occupational History  . Not on file  Tobacco Use  . Smoking status: Never Smoker  . Smokeless tobacco: Never Used  Substance and Sexual Activity  . Alcohol use: No  . Drug use: No  . Sexual activity: Not Currently  Other Topics Concern  . Not on file  Social History Narrative  . Not on file   Social Determinants of Health   Financial Resource Strain:   . Difficulty of Paying Living Expenses:   Food Insecurity:   . Worried About Charity fundraiser in the Last Year:   . Arboriculturist in the Last Year:   Transportation Needs:   . Film/video editor (Medical):   Marland Kitchen Lack of Transportation (Non-Medical):   Physical Activity:   . Days of Exercise per Week:   . Minutes of Exercise per Session:   Stress:   . Feeling of Stress :   Social Connections:   . Frequency of Communication with Friends and Family:   . Frequency of Social Gatherings with Friends and Family:   . Attends Religious Services:   . Active Member of  Clubs or Organizations:   . Attends Archivist Meetings:   Marland Kitchen Marital Status:      Family History: The patient's family history includes Hypertension in her father and mother; Stroke in her mother. There is no history of Heart attack.  ROS:   Please see the history of present illness.     All other systems reviewed and are negative.  EKGs/Labs/Other Studies Reviewed:    The following studies were reviewed today: Echocardiogram 03/10/2019:   TAVR  with a 23 mm Edwards Sapien 3 THV via the TF approach on  04/15/18. Transaortic gradients are elevated but not significantly  changed from the prior study, now mean 30 mmHg, previously 24 mmHg,  peak velocity now 3.27 m/s, previously 3.24 m/s.   EKG:  EKG is  ordered today.  The ekg ordered today demonstrates 67 left bundle branch block  Recent Labs: 05/19/2019: Hemoglobin 8.5; Magnesium 1.6; Platelets 366 08/03/2019: ALT 7 12/03/2019: BUN 16; Creatinine, Ser 0.80; Potassium 4.2; Sodium 138; TSH 2.94  Recent Lipid Panel    Component Value Date/Time   CHOL 127 12/03/2019 1006   CHOL 145 10/17/2018 1255   TRIG 98.0 12/03/2019 1006   HDL 34.60 (L) 12/03/2019 1006   HDL 47 10/17/2018 1255   CHOLHDL 4 12/03/2019 1006   VLDL 19.6 12/03/2019 1006   LDLCALC 73 12/03/2019 1006   LDLCALC 80 10/17/2018 1255    Physical Exam:    VS:  BP 120/70   Pulse 67   Ht 5' 4"  (1.626 m)   Wt 195 lb (88.5 kg)   SpO2 96%   BMI 33.47 kg/m     Wt Readings from Last 3 Encounters:  02/15/20 195 lb (88.5 kg)  01/20/20 195 lb (88.5 kg)  12/08/19 194 lb 12.8 oz (88.4 kg)     GEN:  Well nourished, well developed in no acute distress HEENT: Normal NECK: No JVD; No carotid bruits LYMPHATICS: No lymphadenopathy CARDIAC: RRR, 2/6 systolic murmur, no rubs, gallops RESPIRATORY:  Clear to auscultation without rales, wheezing or rhonchi  ABDOMEN: Soft, non-tender, non-distended MUSCULOSKELETAL:  No edema; No deformity  SKIN: Warm and  dry NEUROLOGIC:  Alert and oriented x 3 PSYCHIATRIC:  Normal affect   ASSESSMENT:    1. PAF (paroxysmal atrial fibrillation) ([place])   2. Essential hypertension   3. S/P TAVR (transcatheter aortic valve replacement)   4. LBBB (left bundle branch block)    PLAN:    In order of problems listed above:  Severe aortic stenosis status post TAVR 2019 -Murmur appreciated on exam.  Stable but mildly elevated gradients noted on echocardiogram last in June 2020.  No changes made.  Postop GI bleed -No longer on anticoagulation.  Hemoglobin has stabilized.  Dr. Laurann Montana has been monitoring.  No melena.  Paroxysmal atrial fibrillation -Continues with sinus rhythm currently.  Once again no longer on anticoagulation because of severe bleeding risks.  Essential hypertension -Stable.  Medications reviewed.  Chronic left bundle branch block -Stable, no significant palpitations.  No need for pacemaker.   Medication Adjustments/Labs and Tests Ordered: Current medicines are reviewed at length with the patient today.  Concerns regarding medicines are outlined above.  Orders Placed This Encounter  Procedures  . EKG 12-Lead   No orders of the defined types were placed in this encounter.   Patient Instructions  Medication Instructions:  The current medical regimen is effective;  continue present plan and medications.  *If you need a refill on your cardiac medications before your next appointment, please call your pharmacy*  Follow-Up: At [facility], you and your health needs are our priority.  As part of our continuing mission to provide you with exceptional heart care, we have created designated Provider Care Teams.  These Care Teams include your primary Cardiologist (physician) and Advanced Practice Providers (APPs -  Physician Assistants and Nurse Practitioners) who all work together to provide you with the care you need, when you need it.  We recommend signing up for the patient portal  called "MyChart".  Sign up information is provided on this After Visit Summary.  MyChart is used to connect with patients for Virtual Visits (Telemedicine).  Patients are able to view lab/test results, encounter notes, upcoming appointments, etc.  Non-urgent messages can be sent to your provider as well.   To learn more about what you can do with MyChart, go to [email or web address].    Your next appointment:   6 month(s)  The format for your next appointment:   In Person  Provider:   Truitt Merle, NP and 1 yr with Dr Marlou Porch.   Thank you for choosing [facility]!!        Signed, [name], MD  02/15/2020 10:38 AM    [place]

## 2020-02-24 ENCOUNTER — Other Ambulatory Visit: Payer: Self-pay | Admitting: Endocrinology

## 2020-04-05 ENCOUNTER — Other Ambulatory Visit (INDEPENDENT_AMBULATORY_CARE_PROVIDER_SITE_OTHER): Payer: Medicare HMO

## 2020-04-05 ENCOUNTER — Other Ambulatory Visit: Payer: Self-pay

## 2020-04-05 DIAGNOSIS — E119 Type 2 diabetes mellitus without complications: Secondary | ICD-10-CM

## 2020-04-05 LAB — BASIC METABOLIC PANEL
BUN: 18 mg/dL (ref 6–23)
CO2: 32 mEq/L (ref 19–32)
Calcium: 10.5 mg/dL (ref 8.4–10.5)
Chloride: 98 mEq/L (ref 96–112)
Creatinine, Ser: 0.8 mg/dL (ref 0.40–1.20)
GFR: 83.65 mL/min (ref >60.00)
Glucose, Bld: 125 mg/dL — ABNORMAL HIGH (ref 70–99)
Potassium: 3.7 mEq/L (ref 3.5–5.1)
Sodium: 136 mEq/L (ref 135–145)

## 2020-04-05 LAB — HEMOGLOBIN A1C: Hgb A1c MFr Bld: 6.6 % — ABNORMAL HIGH (ref 4.6–6.5)

## 2020-04-07 DIAGNOSIS — L0293 Carbuncle, unspecified: Secondary | ICD-10-CM | POA: Diagnosis not present

## 2020-04-08 ENCOUNTER — Other Ambulatory Visit: Payer: Self-pay

## 2020-04-08 ENCOUNTER — Encounter: Payer: Self-pay | Admitting: Endocrinology

## 2020-04-08 ENCOUNTER — Ambulatory Visit (INDEPENDENT_AMBULATORY_CARE_PROVIDER_SITE_OTHER): Payer: Medicare HMO | Admitting: Endocrinology

## 2020-04-08 VITALS — BP 148/78 | HR 72 | Ht 64.0 in | Wt 188.6 lb

## 2020-04-08 DIAGNOSIS — E119 Type 2 diabetes mellitus without complications: Secondary | ICD-10-CM

## 2020-04-08 DIAGNOSIS — E7800 Pure hypercholesterolemia, unspecified: Secondary | ICD-10-CM | POA: Diagnosis not present

## 2020-04-08 LAB — GLUCOSE, POCT (MANUAL RESULT ENTRY): POC Glucose: 106 mg/dl — AB (ref 70–99)

## 2020-04-08 NOTE — Patient Instructions (Signed)
Check blood sugars on waking up 2 days a week  Also check blood sugars about 2 hours after meals and do this after different meals by rotation  Recommended blood sugar levels on waking up are 80-130 and about 2 hours after meal is 130-160  Please bring your blood sugar monitor to each visit, thank you

## 2020-04-08 NOTE — Progress Notes (Addendum)
Patient ID: Janet Williamson, female   DOB: [birth year], 79 y.o.   MRN: [id number]   Reason for Appointment: Diabetes follow-up   History of Present Illness   Type 2 DIABETES MELITUS, date of diagnosis 02/2012     She has had mild diabetes which has been well controlled with metformin ER alone. Has had no side effects from metformin  She also has had diabetes education in 2013 . Oral hypoglycemic drugs: Metformin ER using 750 mg tablets, 1.5 g daily.       Side effects from medications: None  Her A1c is 6.6, was 6.8  Fructosamine last 250  Current management, blood sugar patterns and problems identified:    She did bring her monitor for download  Her blood sugars are much higher than before but they were averaging only 110  She has only a few readings in the low 100 range especially earlier in the day but frequently has readings around 180 and 1 reading over 400  Her lab glucose was 125 and today in the office fasting glucose was 106 with the office machine and 143 with her Accu-Chek Aviva plus glucose monitor  She does not think she has expired test strips and these were verified, also usually has her fingertips clean when checking sugars  Her weight is coming back down compared to last time and it had gone up  Again not able to exercise much    1 being checked primarily midday and afternoon and only occasionally in the evenings and minimal readings after meals are fasting  Blood sugars are excellent with highest reading only 143  Fasting glucose was 125  Is consistent with taking her Metformin and has no GI side effects with this  Her weight has gone up since last year because of lack of exercise, she is having more joint pains   Monitors blood glucose:  less than once a day     Glucometer:  Accu-Chek  Blood Glucose readings from download as above Recent range 134-448 with average 178   PRE-MEAL Fasting  1 PM-4 PM Dinner  8 PM Overall  Glucose range:  ?   82-143  86-126  132   Mean/median:   111    110     Previous visits with dietitian: Several years ago  Dinner At 5-6 pm       Physical activity: exercise: walking a little  Wt Readings from Last 3 Encounters:  04/08/20 188 lb 9.6 oz (85.5 kg)  02/15/20 195 lb (88.5 kg)  01/20/20 195 lb (88.5 kg)     Lab Results  Component Value Date   HGBA1C 6.6 (H) 04/05/2020   HGBA1C 6.8 (H) 12/03/2019   HGBA1C 4.7 (L) 05/17/2019   Lab Results  Component Value Date   MICROALBUR <0.7 08/03/2019   LDLCALC 73 12/03/2019   CREATININE 0.80 04/05/2020     Other active problems evaluated today: See review of systems  LABS:  Lab on 04/05/2020  Component Date Value Ref Range Status  . Sodium 04/05/2020 136  135 - 145 mEq/L Final  . Potassium 04/05/2020 3.7  3.5 - 5.1 mEq/L Final  . Chloride 04/05/2020 98  96 - 112 mEq/L Final  . CO2 04/05/2020 32  19 - 32 mEq/L Final  . Glucose, Bld 04/05/2020 125* 70 - 99 mg/dL Final  . BUN 04/05/2020 18  6 - 23 mg/dL Final  . Creatinine, Ser 04/05/2020 0.80  0.40 - 1.20 mg/dL Final  . GFR 04/05/2020  83.65  >60.00 mL/min Final  . Calcium 04/05/2020 10.5  8.4 - 10.5 mg/dL Final  . Hgb A1c MFr Bld 04/05/2020 6.6* 4.6 - 6.5 % Final   Glycemic Control Guidelines for People with Diabetes:Non Diabetic:  <6%Goal of Therapy: <7%Additional Action Suggested:  >8%     Allergies as of 04/08/2020      Reactions   Ace Inhibitors Swelling, Other (See Comments)   Angioedema   Keflex [cephalexin] Swelling, Other (See Comments)   Lips became swollwn   Rifadin [rifampin] Swelling, Other (See Comments)   Lips became swollen      Medication List       Accurate as of April 08, 2020 10:17 AM. If you have any questions, ask your nurse or doctor.        Accu-Chek Aviva Plus w/Device Kit Use Accu Chek Aviva Plus meter to check blood sugar once daily. DX:E11.65   acetaminophen 325 MG tablet Commonly known as: TYLENOL Take 325-650 mg by mouth every 8 (eight)  hours as needed (for headaches).   amLODipine 5 MG tablet Commonly known as: NORVASC Take 5 mg by mouth daily.   diclofenac 1.3 % Ptch Commonly known as: FLECTOR Place 1 patch onto the skin 2 (two) times daily.   doxycycline 100 MG capsule Commonly known as: VIBRAMYCIN Take 100 mg by mouth 2 (two) times daily.   glucose blood test strip Use Accu chek aviva plus test strips as instructed to check blood sugar once daily.DX:E11.65   hydrochlorothiazide 25 MG tablet Commonly known as: HYDRODIURIL Take 25 mg by mouth daily.   hydrOXYzine 25 MG tablet Commonly known as: ATARAX/VISTARIL Take 25 mg by mouth 3 (three) times daily as needed.   metFORMIN 750 MG 24 hr tablet Commonly known as: GLUCOPHAGE-XR TAKE 2 TABLETS BY MOUTH ONCE DAILY WITH BREAKFAST   metoprolol succinate 50 MG 24 hr tablet Commonly known as: TOPROL-XL Take 50 mg by mouth daily.   simvastatin 20 MG tablet Commonly known as: ZOCOR Take 20 mg by mouth every evening.   SYSTANE OP Place 1 drop into both eyes 2 (two) times daily.   Vitamin D3 125 MCG (5000 UT) Caps Take 5,000 Units by mouth daily.       Allergies:  Allergies  Allergen Reactions  . Ace Inhibitors Swelling and Other (See Comments)    Angioedema   . Keflex [Cephalexin] Swelling and Other (See Comments)    Lips became swollwn  . Rifadin [Rifampin] Swelling and Other (See Comments)    Lips became swollen    Past Medical History:  Diagnosis Date  . Acute lower GI bleeding 05/2019  . Anemia   . Cardiac mass    a. 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 Not seen on most recent TEE 2019.  . Cardiomyopathy in other disease   . Cataracts, bilateral   . Diabetes mellitus    Type 2  . Generalized osteoarthritis   . GERD (gastroesophageal reflux disease)   . HLD (hyperlipidemia)   . Hypertension   . Hypothyroidism   . LBBB (left bundle branch block)   . PAF (paroxysmal atrial fibrillation) ([place])    a. s/p DCCV 06/2017, on Xarelto   . Phlebitis   . S/P TAVR (transcatheter aortic valve replacement) 04/15/2018   Edwards Sapien 3 THV (size 23 mm, model # 9600TFX, serial # [id number]) via the TF approach  . Severe aortic stenosis    a. s/p TAVR 04/2018.  . Stroke [facility]) 2008    Past Surgical History:  Procedure Laterality Date  . ABDOMINAL HYSTERECTOMY    . BIOPSY  05/19/2019   Procedure: BIOPSY;  Surgeon: Ronald Lobo, MD;  Location: [place];  Service: Endoscopy;;  . COLONOSCOPY    . COLONOSCOPY WITH PROPOFOL N/A 10/02/2018   Procedure: COLONOSCOPY WITH PROPOFOL;  Surgeon: Arta Silence, MD;  Location: [place];  Service: Endoscopy;  Laterality: N/A;  . ENTEROSCOPY N/A 05/19/2019   Procedure: ENTEROSCOPY;  Surgeon: Ronald Lobo, MD;  Location: [facility] ENDOSCOPY;  Service: Endoscopy;  Laterality: N/A;  . ESOPHAGOGASTRODUODENOSCOPY (EGD) WITH PROPOFOL N/A 10/02/2018   Procedure: ESOPHAGOGASTRODUODENOSCOPY (EGD) WITH PROPOFOL;  Surgeon: Arta Silence, MD;  Location: [place];  Service: Endoscopy;  Laterality: N/A;  . ESOPHAGOGASTRODUODENOSCOPY (EGD) WITH PROPOFOL N/A 04/23/2019   Procedure: ESOPHAGOGASTRODUODENOSCOPY (EGD) WITH PROPOFOL;  Surgeon: Wonda Horner, MD;  Location: [facility] ENDOSCOPY;  Service: Endoscopy;  Laterality: N/A;  . EYE SURGERY Bilateral    cataract removal  . GIVENS CAPSULE STUDY N/A 10/02/2018   Procedure: GIVENS CAPSULE STUDY;  Surgeon: Arta Silence, MD;  Location: [facility] ENDOSCOPY;  Service: Endoscopy;  Laterality: N/A;  . RIGHT/LEFT HEART CATH AND CORONARY ANGIOGRAPHY N/A 03/06/2018   Procedure: RIGHT/LEFT HEART CATH AND CORONARY ANGIOGRAPHY;  Surgeon: Belva Crome, MD;  Location: [place] CV LAB;  Service: Cardiovascular;  Laterality: N/A;  . SMALL BOWEL ENTEROSCOPY  05/19/2019  . TEE WITHOUT CARDIOVERSION N/A 04/01/2018   Procedure: TRANSESOPHAGEAL ECHOCARDIOGRAM (TEE);  Surgeon: Jerline Pain, MD;  Location: [facility] ENDOSCOPY;  Service: Cardiovascular;  Laterality: N/A;  . TEE WITHOUT  CARDIOVERSION N/A 04/15/2018   Procedure: TRANSESOPHAGEAL ECHOCARDIOGRAM (TEE);  Surgeon: Sherren Mocha, MD;  Location: [place];  Service: Open Heart Surgery;  Laterality: N/A;  . TOTAL KNEE ARTHROPLASTY Right 04/14/2013   Dr Marlou Sa  . TOTAL KNEE ARTHROPLASTY Right 04/14/2013   Procedure: TOTAL KNEE ARTHROPLASTY;  Surgeon: Meredith Pel, MD;  Location: [place];  Service: Orthopedics;  Laterality: Right;  . TRANSCATHETER AORTIC VALVE REPLACEMENT, TRANSFEMORAL  04/15/2018  . TRANSCATHETER AORTIC VALVE REPLACEMENT, TRANSFEMORAL Bilateral 04/15/2018   Procedure: TRANSCATHETER AORTIC VALVE REPLACEMENT, TRANSFEMORAL;  Surgeon: Sherren Mocha, MD;  Location: [place];  Service: Open Heart Surgery;  Laterality: Bilateral;    Family History  Problem Relation Age of Onset  . Stroke Mother   . Hypertension Mother   . Hypertension Father   . Heart attack Neg Hx     Social History:  reports that she has never smoked. She has never used smokeless tobacco. She reports that she does not drink alcohol and does not use drugs.  Review of Systems:  HYPERTENSION:  she has been managed with 5 mg amlodipine, 50 mg metoprolol and hydrochlorothiazide 25 mg, followed by PCP.  She had swelling of the lips with ramipril and has not been prescribed an ARB   HYPERLIPIDEMIA: The lipid abnormality consists of elevated LDL treated with simvastatin 20 mg by PCP and LDL is controlled   LDL as follows:   Lab Results  Component Value Date   CHOL 127 12/03/2019   HDL 34.60 (L) 12/03/2019   LDLCALC 73 12/03/2019   TRIG 98.0 12/03/2019   CHOLHDL 4 12/03/2019    Multinodular goiter:  She has had a long-standing multinodular goiter, last ultrasound was done in 05/2012 which showed the largest nodule to be 4.1 cm nodule in the isthmus Her exam has been showing no changes She only has occasional difficulty with mucus not going down the throat but can swallow well  TSH has been normal  Lab  Results  Component Value Date    TSH 2.94 12/03/2019    Last eye exam was in 12/2018, report not available  HYPERCALCEMIA: has had sporadic high readings Most recently calcium is 10.6 She does take 25 mg HCTZ PTH normal   Lab Results  Component Value Date   CALCIUM 10.5 04/05/2020   CALCIUM 10.6 (H) 12/03/2019   CALCIUM 10.2 08/03/2019   CALCIUM 9.3 05/19/2019   CALCIUM 9.2 05/18/2019    Lab Results  Component Value Date   PTH 23 08/22/2017   CALCIUM 10.5 04/05/2020   CAION 1.14 (L) 04/15/2018     Examination: BP (!) 148/78 (BP Location: Right Arm, Patient Position: Sitting, Cuff Size: Large)   Pulse 72   Ht 5' 4"  (1.626 m)   Wt 188 lb 9.6 oz (85.5 kg)   SpO2 95%   BMI 32.37 kg/m   Body mass index is 32.37 kg/m.   Exam not indicated   ASSESSMENT/ PLAN:   Diabetes type 2 with obesity See history of present illness for discussion of current diabetes management, blood sugar patterns and problems identified  She is on Metformin only with consistently good control  A1c is 6.6  Her home blood sugar monitor appears to be inaccurate and reading falsely high She was given a new Accu-Chek guide meter to replace her Aviva plus  Although she may potentially have some high readings after meals her A1c is not any higher than before and likely will need more accurate readings, lab and office glucose today appear to be fairly good She is starting to lose weight and encouraged her to continue watching her diet and being as active as possible To rotate timing of glucose monitoring as before  Follow-up in 4 months but call if blood sugars start going up  Elayne Snare 04/08/2020, 10:17 AM   Note: This office note was prepared with Dragon voice recognition system technology. Any transcriptional errors that result from this process are unintentional.    Lab on 04/05/2020  Component Date Value Ref Range Status  . Sodium 04/05/2020 136  135 - 145 mEq/L Final  . Potassium 04/05/2020 3.7  3.5 - 5.1 mEq/L Final   . Chloride 04/05/2020 98  96 - 112 mEq/L Final  . CO2 04/05/2020 32  19 - 32 mEq/L Final  . Glucose, Bld 04/05/2020 125* 70 - 99 mg/dL Final  . BUN 04/05/2020 18  6 - 23 mg/dL Final  . Creatinine, Ser 04/05/2020 0.80  0.40 - 1.20 mg/dL Final  . GFR 04/05/2020 83.65  >60.00 mL/min Final  . Calcium 04/05/2020 10.5  8.4 - 10.5 mg/dL Final  . Hgb A1c MFr Bld 04/05/2020 6.6* 4.6 - 6.5 % Final   Glycemic Control Guidelines for People with Diabetes:Non Diabetic:  <6%Goal of Therapy: <7%Additional Action Suggested:  >8%

## 2020-05-03 ENCOUNTER — Telehealth: Payer: Self-pay | Admitting: Endocrinology

## 2020-05-03 NOTE — Telephone Encounter (Signed)
Medication Refill Request  Did you call your pharmacy and request this refill first? Yes  . If patient has not contacted pharmacy first, instruct them to do so for future refills.  . Remind them that contacting the pharmacy for their refill is the quickest method to get the refill.  . Refill policy also stated that it will take anywhere between 24-72 hours to receive the refill.    Name of medication? accu chek test strips  Is this a 90 day supply? yes  Name and location of pharmacy?  [facility] - [place], [place] - [address] Phone:  [phone]  Fax:  [phone]       . Is the request for diabetes test strips? yes . If yes, what brand? accu-chek guide

## 2020-05-04 ENCOUNTER — Other Ambulatory Visit: Payer: Self-pay

## 2020-05-04 MED ORDER — GLUCOSE BLOOD VI STRP: ORAL_STRIP | 2 refills | Status: DC

## 2020-05-04 NOTE — Telephone Encounter (Signed)
Rx sent 

## 2020-05-06 NOTE — Telephone Encounter (Signed)
[facility] - [place], [place] - [address] Phone:  [phone]  Fax:  [phone]     Pharmacy called to advise that [employer] does not cover Accu Chek   Pharmacy is requesting a change to  One Touch Ultra - they will need a meter, lancets, and test strips

## 2020-05-09 ENCOUNTER — Other Ambulatory Visit: Payer: Self-pay

## 2020-05-09 ENCOUNTER — Other Ambulatory Visit: Payer: Self-pay | Admitting: *Deleted

## 2020-05-09 DIAGNOSIS — E119 Type 2 diabetes mellitus without complications: Secondary | ICD-10-CM

## 2020-05-09 MED ORDER — ONETOUCH ULTRASOFT LANCETS MISC: 1.0000 | 1 refills | Status: DC | PRN

## 2020-05-09 MED ORDER — ONETOUCH ULTRA 2 W/DEVICE KIT: PACK | 0 refills | Status: DC

## 2020-05-09 MED ORDER — ONETOUCH ULTRA VI STRP: 1.0000 | ORAL_STRIP | Freq: Three times a day (TID) | 0 refills | Status: DC

## 2020-05-09 MED ORDER — ONETOUCH ULTRA VI STRP: 1.0000 | ORAL_STRIP | 1 refills | Status: DC | PRN

## 2020-05-09 NOTE — Telephone Encounter (Signed)
RX sent for OneTouch Glucometer/test strips/lancets.

## 2020-05-18 ENCOUNTER — Ambulatory Visit (INDEPENDENT_AMBULATORY_CARE_PROVIDER_SITE_OTHER): Payer: Medicare HMO

## 2020-05-18 ENCOUNTER — Ambulatory Visit (INDEPENDENT_AMBULATORY_CARE_PROVIDER_SITE_OTHER): Payer: Medicare HMO | Admitting: Orthopedic Surgery

## 2020-05-18 ENCOUNTER — Encounter: Payer: Self-pay | Admitting: Orthopedic Surgery

## 2020-05-18 DIAGNOSIS — M25562 Pain in left knee: Secondary | ICD-10-CM

## 2020-05-18 MED ORDER — LIDOCAINE HCL 1 % IJ SOLN
5.0000 mL | INTRAMUSCULAR | Status: AC | PRN
  Administered 2020-05-18: 5 mL

## 2020-05-18 MED ORDER — BUPIVACAINE HCL 0.25 % IJ SOLN
4.0000 mL | INTRAMUSCULAR | Status: AC | PRN
  Administered 2020-05-18: 4 mL via INTRA_ARTICULAR

## 2020-05-18 MED ORDER — METHYLPREDNISOLONE ACETATE 40 MG/ML IJ SUSP
40.0000 mg | INTRAMUSCULAR | Status: AC | PRN
  Administered 2020-05-18: 40 mg via INTRA_ARTICULAR

## 2020-05-18 NOTE — Progress Notes (Signed)
Office Visit Note   Patient: Janet Williamson           Date of Birth: Jul 31, 1941           MRN: [id number] Visit Date: 05/18/2020 Requested by: Maurice Small, MD [address] [place],  [place] [address] PCP: Maurice Small, MD  Subjective: Chief Complaint  Patient presents with  . Left Knee - Pain    HPI: Received is a 79 year old patient with left knee arthritis.  Had impact injury to the left knee 3 weeks ago.  Reports mild swelling in the knee.  Was doing reasonably well prior to the fall but did have some arthritic type pain.              ROS: All systems reviewed are negative as they relate to the chief complaint within the history of present illness.  Patient denies  fevers or chills.   Assessment & Plan: Visit Diagnoses:  1. Left knee pain, unspecified chronicity     Plan: Impression is left knee pain with contusion and bone bruise patella.  I think cortisone shot may press the reset button on this knee pain.  Doing well with right total knee replacement.  Left knee injected today.  Tolerated well.  Follow-up as needed.  Follow-Up Instructions: Return if symptoms worsen or fail to improve.   Orders:  Orders Placed This Encounter  Procedures  . XR Knee 1-2 Views Left   No orders of the defined types were placed in this encounter.     Procedures: Large Joint Inj: L knee on 05/18/2020 10:28 PM Indications: diagnostic evaluation, joint swelling and pain Details: 18 G 1.5 in needle, superolateral approach  Arthrogram: No  Medications: 5 mL lidocaine 1 %; 40 mg methylPREDNISolone acetate 40 MG/ML; 4 mL bupivacaine 0.25 % Outcome: tolerated well, no immediate complications Procedure, treatment alternatives, risks and benefits explained, specific risks discussed. Consent was given by the patient. Immediately prior to procedure a time out was called to verify the correct patient, procedure, equipment, support staff and site/side marked as required.  Patient was prepped and draped in the usual sterile fashion.       Clinical Data: No additional findings.  Objective: Vital Signs: There were no vitals taken for this visit.  Physical Exam:   Constitutional: Patient appears well-developed HEENT:  Head: Normocephalic Eyes:EOM are normal Neck: Normal range of motion Cardiovascular: Normal rate Pulmonary/chest: Effort normal Neurologic: Patient is alert Skin: Skin is warm Psychiatric: Patient has normal mood and affect    Ortho Exam: Ortho exam demonstrates no left knee effusion.  Extensor mechanism is intact.  Less than 10 flexion contracture present.  She can bend to about 90.  No groin pain with internal X rotation leg.  Ankle dorsiflexion plantarflexion intact and foot is perfused.  Specialty Comments:  No specialty comments available.  Imaging: XR Knee 1-2 Views Left  Result Date: 05/18/2020 AP lateral left knee reviewed.  Tricompartmental arthritis is present with varus alignment and end-stage medial compartment arthritis in the knee.  No acute fracture.    PMFS History: Patient Active Problem List   Diagnosis Date Noted  . GI bleed 04/21/2019  . Symptomatic anemia 09/30/2018  . S/P TAVR (transcatheter aortic valve replacement) 04/15/2018  . Severe aortic stenosis   . PAF (paroxysmal atrial fibrillation) ([facility]) 03/04/2018  . History of stroke 11/03/2014  . Hypertension   . DM (diabetes mellitus) ([facility]) 05/06/2013  . HTN (hypertension) 05/06/2013  . Anemia 05/06/2013  Past Medical History:  Diagnosis Date  . Acute lower GI bleeding 05/2019  . Anemia   . Cardiac mass    a. 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 Not seen on most recent TEE 2019.  . Cardiomyopathy in other disease   . Cataracts, bilateral   . Diabetes mellitus    Type 2  . Generalized osteoarthritis   . GERD (gastroesophageal reflux disease)   . HLD (hyperlipidemia)   . Hypertension   . Hypothyroidism   . LBBB (left bundle branch  block)   . PAF (paroxysmal atrial fibrillation) ([facility])    a. s/p DCCV 06/2017, on Xarelto  . Phlebitis   . S/P TAVR (transcatheter aortic valve replacement) 04/15/2018   Edwards Sapien 3 THV (size 23 mm, model # 9600TFX, serial # [id number]) via the TF approach  . Severe aortic stenosis    a. s/p TAVR 04/2018.  . Stroke [facility]) 2008    Family History  Problem Relation Age of Onset  . Stroke Mother   . Hypertension Mother   . Hypertension Father   . Heart attack Neg Hx     Past Surgical History:  Procedure Laterality Date  . ABDOMINAL HYSTERECTOMY    . BIOPSY  05/19/2019   Procedure: BIOPSY;  Surgeon: Bernette Redbird, MD;  Location: [facility] ENDOSCOPY;  Service: Endoscopy;;  . COLONOSCOPY    . COLONOSCOPY WITH PROPOFOL N/A 10/02/2018   Procedure: COLONOSCOPY WITH PROPOFOL;  Surgeon: [name], MD;  Location: [facility] ENDOSCOPY;  Service: Endoscopy;  Laterality: N/A;  . ENTEROSCOPY N/A 05/19/2019   Procedure: ENTEROSCOPY;  Surgeon: Bernette Redbird, MD;  Location: [facility] ENDOSCOPY;  Service: Endoscopy;  Laterality: N/A;  . ESOPHAGOGASTRODUODENOSCOPY (EGD) WITH PROPOFOL N/A 10/02/2018   Procedure: ESOPHAGOGASTRODUODENOSCOPY (EGD) WITH PROPOFOL;  Surgeon: [name], MD;  Location: [facility] ENDOSCOPY;  Service: Endoscopy;  Laterality: N/A;  . ESOPHAGOGASTRODUODENOSCOPY (EGD) WITH PROPOFOL N/A 04/23/2019   Procedure: ESOPHAGOGASTRODUODENOSCOPY (EGD) WITH PROPOFOL;  Surgeon: Graylin Shiver, MD;  Location: [facility] ENDOSCOPY;  Service: Endoscopy;  Laterality: N/A;  . EYE SURGERY Bilateral    cataract removal  . GIVENS CAPSULE STUDY N/A 10/02/2018   Procedure: GIVENS CAPSULE STUDY;  Surgeon: [name], MD;  Location: [facility] ENDOSCOPY;  Service: Endoscopy;  Laterality: N/A;  . RIGHT/LEFT HEART CATH AND CORONARY ANGIOGRAPHY N/A 03/06/2018   Procedure: RIGHT/LEFT HEART CATH AND CORONARY ANGIOGRAPHY;  Surgeon: Lyn Records, MD;  Location: [facility] INVASIVE CV LAB;  Service: Cardiovascular;  Laterality: N/A;  . SMALL BOWEL  ENTEROSCOPY  05/19/2019  . TEE WITHOUT CARDIOVERSION N/A 04/01/2018   Procedure: TRANSESOPHAGEAL ECHOCARDIOGRAM (TEE);  Surgeon: Jake Bathe, MD;  Location: [facility] ENDOSCOPY;  Service: Cardiovascular;  Laterality: N/A;  . TEE WITHOUT CARDIOVERSION N/A 04/15/2018   Procedure: TRANSESOPHAGEAL ECHOCARDIOGRAM (TEE);  Surgeon: Tonny Bollman, MD;  Location: [facility] OR;  Service: Open Heart Surgery;  Laterality: N/A;  . TOTAL KNEE ARTHROPLASTY Right 04/14/2013   Dr August Saucer  . TOTAL KNEE ARTHROPLASTY Right 04/14/2013   Procedure: TOTAL KNEE ARTHROPLASTY;  Surgeon: Cammy Copa, MD;  Location: [facility] OR;  Service: Orthopedics;  Laterality: Right;  . TRANSCATHETER AORTIC VALVE REPLACEMENT, TRANSFEMORAL  04/15/2018  . TRANSCATHETER AORTIC VALVE REPLACEMENT, TRANSFEMORAL Bilateral 04/15/2018   Procedure: TRANSCATHETER AORTIC VALVE REPLACEMENT, TRANSFEMORAL;  Surgeon: Tonny Bollman, MD;  Location: [facility] OR;  Service: Open Heart Surgery;  Laterality: Bilateral;   Social History   Occupational History  . Not on file  Tobacco Use  . Smoking status: Never Smoker  . Smokeless tobacco: Never Used  Vaping  Use  . Vaping Use: Never used  Substance and Sexual Activity  . Alcohol use: No  . Drug use: No  . Sexual activity: Not Currently

## 2020-05-21 ENCOUNTER — Other Ambulatory Visit: Payer: Self-pay | Admitting: Endocrinology

## 2020-05-26 ENCOUNTER — Other Ambulatory Visit: Payer: Self-pay

## 2020-05-26 MED ORDER — ONETOUCH ULTRA 2 W/DEVICE KIT: PACK | 0 refills | Status: DC

## 2020-05-26 NOTE — Telephone Encounter (Signed)
Rx sent 

## 2020-05-26 NOTE — Telephone Encounter (Signed)
Patient called stating her pharmacy has never gotten the Rx for the One Touch device kit - she said they got the one touch strips that we called in but never the machine. Patient asked if we could resend it over.   [place], [address] Phone:  [phone]  Fax:  [phone]

## 2020-05-31 ENCOUNTER — Other Ambulatory Visit: Payer: Self-pay | Admitting: *Deleted

## 2020-05-31 MED ORDER — ONETOUCH ULTRA 2 W/DEVICE KIT: PACK | 0 refills | Status: DC

## 2020-06-01 ENCOUNTER — Other Ambulatory Visit: Payer: Self-pay | Admitting: *Deleted

## 2020-06-01 MED ORDER — ONETOUCH ULTRA 2 W/DEVICE KIT: PACK | 0 refills | Status: DC

## 2020-07-01 DIAGNOSIS — D500 Iron deficiency anemia secondary to blood loss (chronic): Secondary | ICD-10-CM | POA: Diagnosis not present

## 2020-07-01 DIAGNOSIS — Z1389 Encounter for screening for other disorder: Secondary | ICD-10-CM | POA: Diagnosis not present

## 2020-07-01 DIAGNOSIS — E118 Type 2 diabetes mellitus with unspecified complications: Secondary | ICD-10-CM | POA: Diagnosis not present

## 2020-07-01 DIAGNOSIS — I10 Essential (primary) hypertension: Secondary | ICD-10-CM | POA: Diagnosis not present

## 2020-07-01 DIAGNOSIS — I480 Paroxysmal atrial fibrillation: Secondary | ICD-10-CM | POA: Diagnosis not present

## 2020-07-01 DIAGNOSIS — K2970 Gastritis, unspecified, without bleeding: Secondary | ICD-10-CM | POA: Diagnosis not present

## 2020-07-01 DIAGNOSIS — Z0000 Encounter for general adult medical examination without abnormal findings: Secondary | ICD-10-CM | POA: Diagnosis not present

## 2020-07-01 DIAGNOSIS — I700 Atherosclerosis of aorta: Secondary | ICD-10-CM | POA: Diagnosis not present

## 2020-07-01 DIAGNOSIS — E785 Hyperlipidemia, unspecified: Secondary | ICD-10-CM | POA: Diagnosis not present

## 2020-07-06 ENCOUNTER — Encounter: Payer: Self-pay | Admitting: Nurse Practitioner

## 2020-07-18 ENCOUNTER — Other Ambulatory Visit: Payer: Self-pay | Admitting: Family Medicine

## 2020-07-18 DIAGNOSIS — Z1231 Encounter for screening mammogram for malignant neoplasm of breast: Secondary | ICD-10-CM

## 2020-08-02 NOTE — Progress Notes (Signed)
Telehealth Visit     Virtual Visit via Telephone Note   This visit type was conducted due to national recommendations for restrictions regarding the COVID-19 Pandemic (e.g. social distancing) in an effort to limit this patient's exposure and mitigate transmission in our community.  Due to her co-morbid illnesses, this patient is at least at moderate risk for complications without adequate follow up.  This format is felt to be most appropriate for this patient at this time.  The patient did not have access to video technology/had technical difficulties with video requiring transitioning to audio format only (telephone).  All issues noted in this document were discussed and addressed.  No physical exam could be performed with this format.  Please refer to the patient's chart for her  consent to telehealth for [facility].   Evaluation Performed:  Follow-up visit   The patient was identified using 2 identifiers.   This visit type was conducted due to national recommendations for restrictions regarding the COVID-19 Pandemic (e.g. social distancing).  This format is felt to be most appropriate for this patient at this time.  All issues noted in this document were discussed and addressed.  No physical exam was performed (except for noted visual exam findings with Video Visits).  Please refer to the patient's chart (MyChart message for video visits and phone note for telephone visits) for the patient's consent to telehealth for [facility].  Date:  08/16/2020   ID:  Janet Williamson, DOB 02-Feb-1941, MRN [id number]  Patient Location:  Unknown  Provider location:   Home  PCP:  Maurice Small, MD  Cardiologist:  Tyrone Sage & Donato Schultz, MD  Electrophysiologist:  None   Chief Complaint:  For follow up.   History of Present Illness:    Janet Williamson is a 79 y.o. female who presents via Web designer for a telehealth visit today.  Seen for Dr. Anne Fu.   She has a history of  prior AS with TAVR July 2019, left bundle branch block,  mitral annular calcification, DM, PAF and on chronic anticoagulation with Xarelto. Prior embolic stroke in 2008. Remote echo in 2008 with concern for mitral valve mass - not noted on follow up studies - but did develop progressive AS. She had GI bleed in December of 2019 with HGB down to 5.   I last saw in January. She saw Dr. Anne Fu in May. She was doing well other than some neck discomfort - felt to be musculoskeletal. She had moved back home. Last echo showed elevated but stable transaortic gradients related to patient prosthetic mismatch. Her Xarelto has since not been continued - decision that risk of bleeding outweighed the use of Xarelto.   Unknown if she has symptoms concerning for COVID-19 infection (fever, chills, cough, or new shortness of breath).   Attempted telephone visit - no answer despite 4 attempts.   Past Medical History:  Diagnosis Date  . Acute lower GI bleeding 05/2019  . Anemia   . Cardiac mass    a. 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 Not seen on most recent TEE 2019.  . Cardiomyopathy in other disease   . Cataracts, bilateral   . Diabetes mellitus    Type 2  . Generalized osteoarthritis   . GERD (gastroesophageal reflux disease)   . HLD (hyperlipidemia)   . Hypertension   . Hypothyroidism   . LBBB (left bundle branch block)   . PAF (paroxysmal atrial fibrillation) (HCC)    a. s/p DCCV 06/2017, on Xarelto  .  Phlebitis   . S/P TAVR (transcatheter aortic valve replacement) 04/15/2018   Edwards Sapien 3 THV (size 23 mm, model # 9600TFX, serial # [id number]) via the TF approach  . Severe aortic stenosis    a. s/p TAVR 04/2018.  . Stroke [facility]) 2008   Past Surgical History:  Procedure Laterality Date  . ABDOMINAL HYSTERECTOMY    . BIOPSY  05/19/2019   Procedure: BIOPSY;  Surgeon: Bernette Redbird, MD;  Location: [facility] ENDOSCOPY;  Service: Endoscopy;;  . COLONOSCOPY    . COLONOSCOPY WITH PROPOFOL N/A  10/02/2018   Procedure: COLONOSCOPY WITH PROPOFOL;  Surgeon: [name], MD;  Location: [facility] ENDOSCOPY;  Service: Endoscopy;  Laterality: N/A;  . ENTEROSCOPY N/A 05/19/2019   Procedure: ENTEROSCOPY;  Surgeon: Bernette Redbird, MD;  Location: [facility] ENDOSCOPY;  Service: Endoscopy;  Laterality: N/A;  . ESOPHAGOGASTRODUODENOSCOPY (EGD) WITH PROPOFOL N/A 10/02/2018   Procedure: ESOPHAGOGASTRODUODENOSCOPY (EGD) WITH PROPOFOL;  Surgeon: [name], MD;  Location: [facility] ENDOSCOPY;  Service: Endoscopy;  Laterality: N/A;  . ESOPHAGOGASTRODUODENOSCOPY (EGD) WITH PROPOFOL N/A 04/23/2019   Procedure: ESOPHAGOGASTRODUODENOSCOPY (EGD) WITH PROPOFOL;  Surgeon: Graylin Shiver, MD;  Location: [facility] ENDOSCOPY;  Service: Endoscopy;  Laterality: N/A;  . EYE SURGERY Bilateral    cataract removal  . GIVENS CAPSULE STUDY N/A 10/02/2018   Procedure: GIVENS CAPSULE STUDY;  Surgeon: [name], MD;  Location: [facility] ENDOSCOPY;  Service: Endoscopy;  Laterality: N/A;  . RIGHT/LEFT HEART CATH AND CORONARY ANGIOGRAPHY N/A 03/06/2018   Procedure: RIGHT/LEFT HEART CATH AND CORONARY ANGIOGRAPHY;  Surgeon: Lyn Records, MD;  Location: [facility] INVASIVE CV LAB;  Service: Cardiovascular;  Laterality: N/A;  . SMALL BOWEL ENTEROSCOPY  05/19/2019  . TEE WITHOUT CARDIOVERSION N/A 04/01/2018   Procedure: TRANSESOPHAGEAL ECHOCARDIOGRAM (TEE);  Surgeon: Jake Bathe, MD;  Location: [facility] ENDOSCOPY;  Service: Cardiovascular;  Laterality: N/A;  . TEE WITHOUT CARDIOVERSION N/A 04/15/2018   Procedure: TRANSESOPHAGEAL ECHOCARDIOGRAM (TEE);  Surgeon: Tonny Bollman, MD;  Location: [facility] OR;  Service: Open Heart Surgery;  Laterality: N/A;  . TOTAL KNEE ARTHROPLASTY Right 04/14/2013   Dr August Saucer  . TOTAL KNEE ARTHROPLASTY Right 04/14/2013   Procedure: TOTAL KNEE ARTHROPLASTY;  Surgeon: Cammy Copa, MD;  Location: [facility] OR;  Service: Orthopedics;  Laterality: Right;  . TRANSCATHETER AORTIC VALVE REPLACEMENT, TRANSFEMORAL  04/15/2018  . TRANSCATHETER AORTIC  VALVE REPLACEMENT, TRANSFEMORAL Bilateral 04/15/2018   Procedure: TRANSCATHETER AORTIC VALVE REPLACEMENT, TRANSFEMORAL;  Surgeon: Tonny Bollman, MD;  Location: [facility] OR;  Service: Open Heart Surgery;  Laterality: Bilateral;     No outpatient medications have been marked as taking for the 08/16/20 encounter (Telemedicine) with Rosalio Macadamia, NP.     Allergies:   Ace inhibitors, Keflex [cephalexin], and Rifadin [rifampin]   Social History   Tobacco Use  . Smoking status: Never Smoker  . Smokeless tobacco: Never Used  Vaping Use  . Vaping Use: Never used  Substance Use Topics  . Alcohol use: No  . Drug use: No     Family Hx: The patient's family history includes Hypertension in her father and mother; Stroke in her mother. There is no history of Heart attack.  ROS:   Please see the history of present illness.   All other systems reviewed are negative except for .    Objective:    Vital Signs:  There were no vitals taken for this visit.   Wt Readings from Last 3 Encounters:  08/11/20 191 lb 12.8 oz (87 kg)  04/08/20 188 lb 9.6 oz (85.5 kg)  02/15/20  195 lb (88.5 kg)       Labs/Other Tests and Data Reviewed:    Lab Results  Component Value Date   WBC 8.0 05/19/2019   HGB 8.5 (L) 05/19/2019   HCT 28.4 (L) 05/19/2019   PLT 366 05/19/2019   GLUCOSE 143 (H) 08/08/2020   CHOL 136 08/08/2020   TRIG 90.0 08/08/2020   HDL 43.10 08/08/2020   LDLCALC 75 08/08/2020   ALT 9 08/08/2020   AST 15 08/08/2020   NA 137 08/08/2020   K 3.5 08/08/2020   CL 98 08/08/2020   CREATININE 0.79 08/08/2020   BUN 12 08/08/2020   CO2 31 08/08/2020   TSH 2.94 12/03/2019   INR 1.5 (H) 04/21/2019   HGBA1C 7.1 (H) 08/08/2020   MICROALBUR 2.1 (H) 08/08/2020     BNP (last 3 results) No results for input(s): BNP in the last 8760 hours.  ProBNP (last 3 results) No results for input(s): PROBNP in the last 8760 hours.    Prior CV studies:    The following studies were reviewed  today:  ECHO IMPRESSIONS 03/2019  1. The left ventricle has hyperdynamic systolic function, with an ejection fraction of >65%. The cavity size was normal. There is moderate concentric left ventricular hypertrophy. Left ventricular diastolic Doppler parameters are consistent with  impaired relaxation. Elevated left ventricular end-diastolic pressure. 2. The right ventricle has normal systolic function. The cavity was mildly enlarged. There is no increase in right ventricular wall thickness. Right ventricular systolic pressure is mildly elevated with an estimated pressure of 45.8 mmHg. 3. Left atrial size was mildly dilated. 4. Right atrial size was mildly dilated. 5. The mitral valve is degenerative. Mild thickening of the mitral valve leaflet. Mild calcification of the mitral valve leaflet. There is moderate mitral annular calcification present. Mitral valve regurgitation is moderate by color flow Doppler. No  evidence of mitral valve stenosis. 6. Tricuspid valve regurgitation is moderate. 7. Aortic valve regurgitation was not assessed by color flow Doppler. Moderate stenosis of the aortic valve.   2D ECHO 05/21/18 ( 1 month s/p TAVR) Study Conclusions - Left ventricle: The cavity size was normal. There was severe focal basal hypertrophy of the septum. Systolic function was normal. The estimated ejection fraction was in the range of 55% to 60%. Wall motion was normal; there were no regional wall motion abnormalities. Doppler parameters are consistent with abnormal left ventricular relaxation (grade 1 diastolic dysfunction). - Aortic valve: TAVR with a 23 mm Edwards Sapien 3 THV via the TF approach on 04/15/18. Peak velocity (S): 324 cm/s. Mean gradient (S): 24 mm Hg. Valve area (Vmax): 1.11 cm^2. - Mitral valve: Moderately calcified annulus. - Left atrium: The atrium was mildly dilated. - Tricuspid valve: There was moderate regurgitation. - Pulmonic valve: There  was moderate regurgitation. - Pulmonary arteries: Systolic pressure was mildly increased. PA peak pressure: 43 mm Hg (S).     TAVR OPERATIVE NOTE7/06/2018  Procedure: Transcatheter Aortic Valve Replacement - Percutaneous Transfemoral Approach Edwards Sapien 3 THV (size 45mm, model # 9600TFX, serial # [id number])  Co-Surgeons:Clarence H. Cornelius Moras, MD and Tonny Bollman, MD  Pre-operative Echo Findings: ? Severe aortic stenosis ? Normalleft ventricular systolic function  Post-operative Echo Findings: ? Noparavalvular leak ? Normal/unchangedleft ventricular systolic function  ___________________    ASSESSMENT & PLAN:    1.  PAF  2. HTN  3. Prior TAVR  4. LBBB  5. Prior GI bleed  6. Prior stroke   Patient Risk:   After full review of  this patient's clinical status, I feel that they are at least moderate risk at this time.  Time:   Today, I have spent 0 minutes with the patient with telehealth technology discussing the above issues.     Medication Adjustments/Labs and Tests Ordered: Current medicines are reviewed at length with the patient today.  Concerns regarding medicines are outlined above.   Tests Ordered: No orders of the defined types were placed in this encounter.   Medication Changes: No orders of the defined types were placed in this encounter.   Disposition:  Unable to complete visit - patient did not answer phone. No voice mail set up.    Patient is agreeable to this plan and will call if any problems develop in the interim.   Avelina Laine, NP  08/16/2020 7:17 AM    [facility]

## 2020-08-08 ENCOUNTER — Other Ambulatory Visit: Payer: Self-pay

## 2020-08-08 ENCOUNTER — Other Ambulatory Visit (INDEPENDENT_AMBULATORY_CARE_PROVIDER_SITE_OTHER): Payer: Medicare HMO

## 2020-08-08 DIAGNOSIS — E119 Type 2 diabetes mellitus without complications: Secondary | ICD-10-CM

## 2020-08-08 DIAGNOSIS — E7800 Pure hypercholesterolemia, unspecified: Secondary | ICD-10-CM

## 2020-08-08 LAB — COMPREHENSIVE METABOLIC PANEL
ALT: 9 U/L (ref 0–35)
AST: 15 U/L (ref 0–37)
Albumin: 4 g/dL (ref 3.5–5.2)
Alkaline Phosphatase: 58 U/L (ref 39–117)
BUN: 12 mg/dL (ref 6–23)
CO2: 31 mEq/L (ref 19–32)
Calcium: 10.3 mg/dL (ref 8.4–10.5)
Chloride: 98 mEq/L (ref 96–112)
Creatinine, Ser: 0.79 mg/dL (ref 0.40–1.20)
GFR: 71.02 mL/min (ref >60.00)
Glucose, Bld: 143 mg/dL — ABNORMAL HIGH (ref 70–99)
Potassium: 3.5 mEq/L (ref 3.5–5.1)
Sodium: 137 mEq/L (ref 135–145)
Total Bilirubin: 0.4 mg/dL (ref 0.2–1.2)
Total Protein: 8.4 g/dL — ABNORMAL HIGH (ref 6.0–8.3)

## 2020-08-08 LAB — MICROALBUMIN / CREATININE URINE RATIO
Creatinine,U: 143.8 mg/dL
Microalb Creat Ratio: 1.5 mg/g (ref 0.0–30.0)
Microalb, Ur: 2.1 mg/dL — ABNORMAL HIGH (ref 0.0–1.9)

## 2020-08-08 LAB — HEMOGLOBIN A1C: Hgb A1c MFr Bld: 7.1 % — ABNORMAL HIGH (ref 4.6–6.5)

## 2020-08-08 LAB — LIPID PANEL
Cholesterol: 136 mg/dL (ref 0–200)
HDL: 43.1 mg/dL (ref >39.00)
LDL Cholesterol: 75 mg/dL (ref 0–99)
NonHDL: 93.28
Total CHOL/HDL Ratio: 3
Triglycerides: 90 mg/dL (ref 0.0–149.0)
VLDL: 18 mg/dL (ref 0.0–40.0)

## 2020-08-10 ENCOUNTER — Telehealth: Payer: Self-pay | Admitting: *Deleted

## 2020-08-10 NOTE — Telephone Encounter (Signed)
  Patient Consent for Virtual Visit         Janet Williamson has provided verbal consent on 08/10/2020 for a virtual visit (video or telephone).   CONSENT FOR VIRTUAL VISIT FOR:  Janet Williamson  By participating in this virtual visit I agree to the following:  I hereby voluntarily request, consent and authorize [facility] and its employed or contracted physicians, physician assistants, nurse practitioners or other licensed health care professionals (the Practitioner), to provide me with telemedicine health care services (the "Services") as deemed necessary by the treating Practitioner. I acknowledge and consent to receive the Services by the Practitioner via telemedicine. I understand that the telemedicine visit will involve communicating with the Practitioner through live audiovisual communication technology and the disclosure of certain medical information by electronic transmission. I acknowledge that I have been given the opportunity to request an in-person assessment or other available alternative prior to the telemedicine visit and am voluntarily participating in the telemedicine visit.  I understand that I have the right to withhold or withdraw my consent to the use of telemedicine in the course of my care at any time, without affecting my right to future care or treatment, and that the Practitioner or I may terminate the telemedicine visit at any time. I understand that I have the right to inspect all information obtained and/or recorded in the course of the telemedicine visit and may receive copies of available information for a reasonable fee.  I understand that some of the potential risks of receiving the Services via telemedicine include:  Marland Kitchen Delay or interruption in medical evaluation due to technological equipment failure or disruption; . Information transmitted may not be sufficient (e.g. poor resolution of images) to allow for appropriate medical decision making by the  Practitioner; and/or  . In rare instances, security protocols could fail, causing a breach of personal health information.  Furthermore, I acknowledge that it is my responsibility to provide information about my medical history, conditions and care that is complete and accurate to the best of my ability. I acknowledge that Practitioner's advice, recommendations, and/or decision may be based on factors not within their control, such as incomplete or inaccurate data provided by me or distortions of diagnostic images or specimens that may result from electronic transmissions. I understand that the practice of medicine is not an exact science and that Practitioner makes no warranties or guarantees regarding treatment outcomes. I acknowledge that a copy of this consent can be made available to me via my patient portal [facility] MyChart), or I can request a printed copy by calling the office of [facility].    I understand that my insurance will be billed for this visit.   I have read or had this consent read to me. . I understand the contents of this consent, which adequately explains the benefits and risks of the Services being provided via telemedicine.  . I have been provided ample opportunity to ask questions regarding this consent and the Services and have had my questions answered to my satisfaction. . I give my informed consent for the services to be provided through the use of telemedicine in my medical care

## 2020-08-11 ENCOUNTER — Other Ambulatory Visit: Payer: Self-pay

## 2020-08-11 ENCOUNTER — Ambulatory Visit (INDEPENDENT_AMBULATORY_CARE_PROVIDER_SITE_OTHER): Payer: Medicare HMO | Admitting: Endocrinology

## 2020-08-11 ENCOUNTER — Encounter: Payer: Self-pay | Admitting: Endocrinology

## 2020-08-11 VITALS — BP 120/70 | HR 73 | Ht 64.0 in | Wt 191.8 lb

## 2020-08-11 DIAGNOSIS — E1165 Type 2 diabetes mellitus with hyperglycemia: Secondary | ICD-10-CM

## 2020-08-11 DIAGNOSIS — E7800 Pure hypercholesterolemia, unspecified: Secondary | ICD-10-CM | POA: Diagnosis not present

## 2020-08-11 DIAGNOSIS — E042 Nontoxic multinodular goiter: Secondary | ICD-10-CM

## 2020-08-11 NOTE — Progress Notes (Signed)
Patient ID: Janet Williamson, female   DOB: 1941/08/06, 79 y.o.   MRN: [id number]   Reason for Appointment: Diabetes follow-up   History of Present Illness   Type 2 DIABETES MELITUS, date of diagnosis 02/2012     She has had mild diabetes which has been well controlled with metformin ER alone. Has had no side effects from metformin  She also has had diabetes education in 2013 . Oral hypoglycemic drugs: Metformin ER using 750 mg tablets, 1.5 g daily.       Side effects from medications: None  Her A1c is 7.1 compared to 6.6  Fructosamine last 250  Current management, blood sugar patterns and problems identified:    She did start using the One Touch monitor that was given but the date and time is not programmed properly and difficult to identify a pattern  She is checking her blood sugar daily sporadically   Does not appear to have any significant or consistently high readings however  Lab glucose after breakfast was 143  She is asking about fluids, she is eating a lot of oranges, apples and grapes  Her weight is slightly higher at 192  She did not do any exercise but is possibly going to start water aerobics   Monitors blood glucose:  less than once a day     Glucometer:  One Touch ultra 2  Blood Glucose readings from meter review  Last few readings as follows: 81-163, not clear which time she is checking the readings and may be higher after breakfast  Previous range 134-448 with average 178   Previous visits with dietitian: Several years ago  Dinner At 5-6 pm        Wt Readings from Last 3 Encounters:  08/11/20 191 lb 12.8 oz (87 kg)  04/08/20 188 lb 9.6 oz (85.5 kg)  02/15/20 195 lb (88.5 kg)     Lab Results  Component Value Date   HGBA1C 7.1 (H) 08/08/2020   HGBA1C 6.6 (H) 04/05/2020   HGBA1C 6.8 (H) 12/03/2019   Lab Results  Component Value Date   MICROALBUR 2.1 (H) 08/08/2020   [place] 75 08/08/2020   CREATININE 0.79 08/08/2020      Other active problems evaluated today: See review of systems  LABS:  Lab on 08/08/2020  Component Date Value Ref Range Status  . Microalb, Ur 08/08/2020 2.1* 0.0 - 1.9 mg/dL Final  . Creatinine,U 08/08/2020 143.8  mg/dL Final  . Microalb Creat Ratio 08/08/2020 1.5  0.0 - 30.0 mg/g Final  . Cholesterol 08/08/2020 136  0 - 200 mg/dL Final   ATP III Classification       Desirable:  < 200 mg/dL               Borderline High:  200 - 239 mg/dL          High:  > = 240 mg/dL  . Triglycerides 08/08/2020 90.0  0 - 149 mg/dL Final   Normal:  <150 mg/dLBorderline High:  150 - 199 mg/dL  . HDL 08/08/2020 43.10  >39.00 mg/dL Final  . VLDL 08/08/2020 18.0  0.0 - 40.0 mg/dL Final  . LDL Cholesterol 08/08/2020 75  0 - 99 mg/dL Final  . Total CHOL/HDL Ratio 08/08/2020 3   Final                  Men          Women1/2 Average Risk     3.4  3.3Average Risk          5.0          4.42X Average Risk          9.6          7.13X Average Risk          15.0          11.0                      . NonHDL 08/08/2020 93.28   Final   NOTE:  Non-HDL goal should be 30 mg/dL higher than patient's LDL goal (i.e. LDL goal of < 70 mg/dL, would have non-HDL goal of < 100 mg/dL)  . Sodium 08/08/2020 137  135 - 145 mEq/L Final  . Potassium 08/08/2020 3.5  3.5 - 5.1 mEq/L Final  . Chloride 08/08/2020 98  96 - 112 mEq/L Final  . CO2 08/08/2020 31  19 - 32 mEq/L Final  . Glucose, Bld 08/08/2020 143* 70 - 99 mg/dL Final  . BUN 08/08/2020 12  6 - 23 mg/dL Final  . Creatinine, Ser 08/08/2020 0.79  0.40 - 1.20 mg/dL Final  . Total Bilirubin 08/08/2020 0.4  0.2 - 1.2 mg/dL Final  . Alkaline Phosphatase 08/08/2020 58  39 - 117 U/L Final  . AST 08/08/2020 15  0 - 37 U/L Final  . ALT 08/08/2020 9  0 - 35 U/L Final  . Total Protein 08/08/2020 8.4* 6.0 - 8.3 g/dL Final  . Albumin 08/08/2020 4.0  3.5 - 5.2 g/dL Final  . GFR 08/08/2020 71.02  >60.00 mL/min Final   Calculated using the CKD-EPI Creatinine Equation (2021)  . Calcium  08/08/2020 10.3  8.4 - 10.5 mg/dL Final  . Hgb A1c MFr Bld 08/08/2020 7.1* 4.6 - 6.5 % Final   Glycemic Control Guidelines for People with Diabetes:Non Diabetic:  <6%Goal of Therapy: <7%Additional Action Suggested:  >8%     Allergies as of 08/11/2020      Reactions   Ace Inhibitors Swelling, Other (See Comments)   Angioedema   Keflex [cephalexin] Swelling, Other (See Comments)   Lips became swollwn   Rifadin [rifampin] Swelling, Other (See Comments)   Lips became swollen      Medication List       Accurate as of August 11, 2020  5:01 PM. If you have any questions, ask your nurse or doctor.        acetaminophen 325 MG tablet Commonly known as: TYLENOL Take 325-650 mg by mouth every 8 (eight) hours as needed (for headaches).   amLODipine 5 MG tablet Commonly known as: NORVASC Take 5 mg by mouth daily.   diclofenac 1.3 % Ptch Commonly known as: FLECTOR Place 1 patch onto the skin 2 (two) times daily.   doxycycline 100 MG capsule Commonly known as: VIBRAMYCIN Take 100 mg by mouth 2 (two) times daily.   hydrochlorothiazide 25 MG tablet Commonly known as: HYDRODIURIL Take 25 mg by mouth daily.   hydrOXYzine 25 MG tablet Commonly known as: ATARAX/VISTARIL Take 25 mg by mouth 3 (three) times daily as needed.   metFORMIN 750 MG 24 hr tablet Commonly known as: GLUCOPHAGE-XR TAKE 2 TABLETS BY MOUTH ONCE DAILY WITH BREAKFAST   metoprolol succinate 50 MG 24 hr tablet Commonly known as: TOPROL-XL Take 50 mg by mouth daily.   ONE TOUCH ULTRA 2 w/Device Kit Use to check blood sugars three times daily DX: E11.9   OneTouch Ultra test strip Generic  drug: glucose blood 1 each by Other route 3 (three) times daily. E11.9   onetouch ultrasoft lancets 1 each by Other route as needed for other. Use to check blood sugars 3X a day   simvastatin 20 MG tablet Commonly known as: ZOCOR Take 20 mg by mouth every evening.   SYSTANE OP Place 1 drop into both eyes 2 (two) times  daily.   Vitamin D3 125 MCG (5000 UT) Caps Take 5,000 Units by mouth daily.       Allergies:  Allergies  Allergen Reactions  . Ace Inhibitors Swelling and Other (See Comments)    Angioedema   . Keflex [Cephalexin] Swelling and Other (See Comments)    Lips became swollwn  . Rifadin [Rifampin] Swelling and Other (See Comments)    Lips became swollen    Past Medical History:  Diagnosis Date  . Acute lower GI bleeding 05/2019  . Anemia   . Cardiac mass    a. 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 Not seen on most recent TEE 2019.  . Cardiomyopathy in other disease   . Cataracts, bilateral   . Diabetes mellitus    Type 2  . Generalized osteoarthritis   . GERD (gastroesophageal reflux disease)   . HLD (hyperlipidemia)   . Hypertension   . Hypothyroidism   . LBBB (left bundle branch block)   . PAF (paroxysmal atrial fibrillation) ([place])    a. s/p DCCV 06/2017, on Xarelto  . Phlebitis   . S/P TAVR (transcatheter aortic valve replacement) 04/15/2018   Edwards Sapien 3 THV (size 23 mm, model # [id number], serial # [id number]) via the TF approach  . Severe aortic stenosis    a. s/p TAVR 04/2018.  . Stroke [facility]) 2008    Past Surgical History:  Procedure Laterality Date  . ABDOMINAL HYSTERECTOMY    . BIOPSY  05/19/2019   Procedure: BIOPSY;  Surgeon: Ronald Lobo, MD;  Location: [place];  Service: Endoscopy;;  . COLONOSCOPY    . COLONOSCOPY WITH PROPOFOL N/A 10/02/2018   Procedure: COLONOSCOPY WITH PROPOFOL;  Surgeon: Arta Silence, MD;  Location: [place];  Service: Endoscopy;  Laterality: N/A;  . ENTEROSCOPY N/A 05/19/2019   Procedure: ENTEROSCOPY;  Surgeon: Ronald Lobo, MD;  Location: [facility] ENDOSCOPY;  Service: Endoscopy;  Laterality: N/A;  . ESOPHAGOGASTRODUODENOSCOPY (EGD) WITH PROPOFOL N/A 10/02/2018   Procedure: ESOPHAGOGASTRODUODENOSCOPY (EGD) WITH PROPOFOL;  Surgeon: Arta Silence, MD;  Location: [place];  Service: Endoscopy;  Laterality: N/A;  .  ESOPHAGOGASTRODUODENOSCOPY (EGD) WITH PROPOFOL N/A 04/23/2019   Procedure: ESOPHAGOGASTRODUODENOSCOPY (EGD) WITH PROPOFOL;  Surgeon: Wonda Horner, MD;  Location: [facility] ENDOSCOPY;  Service: Endoscopy;  Laterality: N/A;  . EYE SURGERY Bilateral    cataract removal  . GIVENS CAPSULE STUDY N/A 10/02/2018   Procedure: GIVENS CAPSULE STUDY;  Surgeon: Arta Silence, MD;  Location: [facility] ENDOSCOPY;  Service: Endoscopy;  Laterality: N/A;  . RIGHT/LEFT HEART CATH AND CORONARY ANGIOGRAPHY N/A 03/06/2018   Procedure: RIGHT/LEFT HEART CATH AND CORONARY ANGIOGRAPHY;  Surgeon: Belva Crome, MD;  Location: [place] CV LAB;  Service: Cardiovascular;  Laterality: N/A;  . SMALL BOWEL ENTEROSCOPY  05/19/2019  . TEE WITHOUT CARDIOVERSION N/A 04/01/2018   Procedure: TRANSESOPHAGEAL ECHOCARDIOGRAM (TEE);  Surgeon: Jerline Pain, MD;  Location: [facility] ENDOSCOPY;  Service: Cardiovascular;  Laterality: N/A;  . TEE WITHOUT CARDIOVERSION N/A 04/15/2018   Procedure: TRANSESOPHAGEAL ECHOCARDIOGRAM (TEE);  Surgeon: Sherren Mocha, MD;  Location: [place];  Service: Open Heart Surgery;  Laterality: N/A;  . TOTAL KNEE ARTHROPLASTY Right  04/14/2013   Dr Marlou Sa  . TOTAL KNEE ARTHROPLASTY Right 04/14/2013   Procedure: TOTAL KNEE ARTHROPLASTY;  Surgeon: Meredith Pel, MD;  Location: [place];  Service: Orthopedics;  Laterality: Right;  . TRANSCATHETER AORTIC VALVE REPLACEMENT, TRANSFEMORAL  04/15/2018  . TRANSCATHETER AORTIC VALVE REPLACEMENT, TRANSFEMORAL Bilateral 04/15/2018   Procedure: TRANSCATHETER AORTIC VALVE REPLACEMENT, TRANSFEMORAL;  Surgeon: Sherren Mocha, MD;  Location: [place];  Service: Open Heart Surgery;  Laterality: Bilateral;    Family History  Problem Relation Age of Onset  . Stroke Mother   . Hypertension Mother   . Hypertension Father   . Heart attack Neg Hx     Social History:  reports that she has never smoked. She has never used smokeless tobacco. She reports that she does not drink alcohol and does not use  drugs.  Review of Systems:  HYPERTENSION:  she has been managed with 5 mg amlodipine, 50 mg metoprolol and hydrochlorothiazide 25 mg, followed by PCP. She had swelling of the lips with ramipril and has not been prescribed an ARB   HYPERLIPIDEMIA: The lipid abnormality consists of elevated LDL treated with simvastatin 20 mg by PCP and LDL is controlled   LDL as follows:   Lab Results  Component Value Date   CHOL 136 08/08/2020   HDL 43.10 08/08/2020   LDLCALC 75 08/08/2020   TRIG 90.0 08/08/2020   CHOLHDL 3 08/08/2020    Multinodular goiter:  She has had a long-standing multinodular goiter, last ultrasound was done in 05/2012 which showed the largest nodule to be 4.1 cm nodule in the isthmus  She only has occasional difficulty with feeling of choking in her throat especially in the mornings but no difficulty swallowing, this is not any worse  TSH has been normal  Lab Results  Component Value Date   TSH 2.94 12/03/2019    Last eye exam was in 12/2018, report not available  HYPERCALCEMIA: has had very occasional high readings Most recently calcium is 10.3 She does take 25 mg HCTZ PTH low normal   Lab Results  Component Value Date   CALCIUM 10.3 08/08/2020   CALCIUM 10.5 04/05/2020   CALCIUM 10.6 (H) 12/03/2019   CALCIUM 10.2 08/03/2019   CALCIUM 9.3 05/19/2019    Lab Results  Component Value Date   PTH 23 08/22/2017   CALCIUM 10.3 08/08/2020   CAION 1.14 (L) 04/15/2018     Examination: BP 120/70   Pulse 73   Ht 5' 4"  (1.626 m)   Wt 191 lb 12.8 oz (87 kg)   SpO2 99%   BMI 32.92 kg/m   Body mass index is 32.92 kg/m.   She has a 3.5 cm midline thyroid nodule which is relatively soft Right thyroid lobe is rubbery and about 2-2.5 times normal, left side is smaller   ASSESSMENT/ PLAN:   Diabetes type 2 with obesity See history of present illness for discussion of current diabetes management, blood sugar patterns and problems identified  She is on  Metformin only with consistently good control  A1c is 7.1, previously 6.6  Her home blood sugar monitor not being programmed to the right date and time and this was done today Although she may have a slightly higher A1c and blood sugars as high as about 160 the level of control is still adequate for her age and comorbid conditions  She may do better with some more exercise and she is planning to do some water exercises soon  Continue 1500 mg of Metformin,  renal function normal  Goiter: She has only minimal compressive symptoms and nonprogressive. Discussed that at this point she is not a candidate for surgery We will recheck thyroid levels on the next visit  LIPIDS: Excellent with LDL 75  Microalbumin normal  Elayne Snare 08/11/2020, 5:01 PM   Note: This office note was prepared with Dragon voice recognition system technology. Any transcriptional errors that result from this process are unintentional.

## 2020-08-11 NOTE — Patient Instructions (Signed)
Check blood sugars on waking up 2-3 days a week  Also check blood sugars about 2 hours after meals and do this after different meals by rotation  Recommended blood sugar levels on waking up are 90-130 and about 2 hours after meal is 130-160  Please bring your blood sugar monitor to each visit, thank you   

## 2020-08-15 ENCOUNTER — Telehealth: Payer: Self-pay

## 2020-08-15 ENCOUNTER — Other Ambulatory Visit: Payer: Self-pay | Admitting: *Deleted

## 2020-08-15 MED ORDER — METFORMIN HCL ER 750 MG PO TB24
ORAL_TABLET | ORAL | 1 refills | Status: DC
Start: 2020-08-15 — End: 2020-08-19

## 2020-08-15 NOTE — Telephone Encounter (Signed)
Rx sent 

## 2020-08-15 NOTE — Telephone Encounter (Signed)
New message   1. Which medications need to be refilled? (please list name of each medication and dose if known) metFORMIN (GLUCOPHAGE-XR) 750 MG 24 hr tablet  2. Which pharmacy/location (including street and city if local pharmacy) is medication to be sent to? [facility] order    3. Do they need a 30 day or 90 day supply? 90 day supply

## 2020-08-16 ENCOUNTER — Other Ambulatory Visit: Payer: Self-pay

## 2020-08-16 ENCOUNTER — Telehealth: Payer: Self-pay | Admitting: *Deleted

## 2020-08-16 ENCOUNTER — Telehealth (INDEPENDENT_AMBULATORY_CARE_PROVIDER_SITE_OTHER): Payer: Medicare HMO | Admitting: Nurse Practitioner

## 2020-08-16 ENCOUNTER — Encounter: Payer: Self-pay | Admitting: Nurse Practitioner

## 2020-08-16 DIAGNOSIS — E785 Hyperlipidemia, unspecified: Secondary | ICD-10-CM

## 2020-08-16 DIAGNOSIS — Z952 Presence of prosthetic heart valve: Secondary | ICD-10-CM

## 2020-08-16 DIAGNOSIS — I10 Essential (primary) hypertension: Secondary | ICD-10-CM

## 2020-08-16 DIAGNOSIS — I480 Paroxysmal atrial fibrillation: Secondary | ICD-10-CM

## 2020-08-16 DIAGNOSIS — Z8719 Personal history of other diseases of the digestive system: Secondary | ICD-10-CM

## 2020-08-16 DIAGNOSIS — I447 Left bundle-branch block, unspecified: Secondary | ICD-10-CM

## 2020-08-16 NOTE — Telephone Encounter (Signed)
Called pt X 4 to get ready for [facility] visit today. No vm and not available. This appt was made in office with Norma Fredrickson, NP and looks like scheduler sent letter to change appt to [facility] since scheduler could not get answer from pt. Will send to [place] to [employer] [facility] [employer].

## 2020-08-17 ENCOUNTER — Ambulatory Visit
Admission: RE | Admit: 2020-08-17 | Discharge: 2020-08-17 | Disposition: A | Discharge status: Home / Self Care | Payer: Medicare HMO | Source: Ambulatory Visit | Attending: Family Medicine | Admitting: Family Medicine

## 2020-08-17 ENCOUNTER — Other Ambulatory Visit: Payer: Self-pay

## 2020-08-17 DIAGNOSIS — Z1231 Encounter for screening mammogram for malignant neoplasm of breast: Secondary | ICD-10-CM

## 2020-08-19 ENCOUNTER — Other Ambulatory Visit: Payer: Self-pay | Admitting: Endocrinology

## 2020-08-23 ENCOUNTER — Other Ambulatory Visit: Payer: Self-pay | Admitting: *Deleted

## 2020-08-23 ENCOUNTER — Telehealth: Payer: Self-pay

## 2020-08-23 DIAGNOSIS — E119 Type 2 diabetes mellitus without complications: Secondary | ICD-10-CM

## 2020-08-23 MED ORDER — ONETOUCH ULTRASOFT LANCETS MISC: 1.0000 | 1 refills | Status: DC | PRN

## 2020-08-23 MED ORDER — ONETOUCH ULTRA VI STRP: 1.0000 | ORAL_STRIP | Freq: Three times a day (TID) | 1 refills | Status: DC

## 2020-08-23 MED ORDER — ONETOUCH ULTRA 2 W/DEVICE KIT
PACK | 0 refills | Status: DC
Start: 2020-08-23 — End: 2024-05-27

## 2020-08-23 NOTE — Telephone Encounter (Signed)
New message     1. Which medications need to be refilled? (please list name of each medication and dose if known)   Lancets (ONETOUCH ULTRASOFT) lancets  Blood Glucose Monitoring Suppl (ONE TOUCH ULTRA 2) w/Device KIT  glucose blood (ONETOUCH ULTRA) test strip  2. Which pharmacy/location (including street and city if local pharmacy) is medication to be sent to? [place]

## 2020-08-23 NOTE — Telephone Encounter (Signed)
Rx sent 

## 2020-09-28 ENCOUNTER — Other Ambulatory Visit: Payer: Self-pay | Admitting: Family Medicine

## 2020-09-28 DIAGNOSIS — E2839 Other primary ovarian failure: Secondary | ICD-10-CM

## 2020-09-28 DIAGNOSIS — M8580 Other specified disorders of bone density and structure, unspecified site: Secondary | ICD-10-CM

## 2020-12-07 ENCOUNTER — Other Ambulatory Visit (INDEPENDENT_AMBULATORY_CARE_PROVIDER_SITE_OTHER): Payer: Medicare HMO

## 2020-12-07 ENCOUNTER — Other Ambulatory Visit: Payer: Self-pay

## 2020-12-07 DIAGNOSIS — E042 Nontoxic multinodular goiter: Secondary | ICD-10-CM | POA: Diagnosis not present

## 2020-12-07 DIAGNOSIS — E1165 Type 2 diabetes mellitus with hyperglycemia: Secondary | ICD-10-CM

## 2020-12-07 LAB — HEMOGLOBIN A1C: Hgb A1c MFr Bld: 6.9 % — ABNORMAL HIGH (ref 4.6–6.5)

## 2020-12-07 LAB — BASIC METABOLIC PANEL
BUN: 15 mg/dL (ref 6–23)
CO2: 30 mEq/L (ref 19–32)
Calcium: 10.3 mg/dL (ref 8.4–10.5)
Chloride: 100 mEq/L (ref 96–112)
Creatinine, Ser: 0.74 mg/dL (ref 0.40–1.20)
GFR: 76.64 mL/min (ref >60.00)
Glucose, Bld: 119 mg/dL — ABNORMAL HIGH (ref 70–99)
Potassium: 3.8 mEq/L (ref 3.5–5.1)
Sodium: 137 mEq/L (ref 135–145)

## 2020-12-08 LAB — T4, FREE: Free T4: 0.8 ng/dL (ref 0.60–1.60)

## 2020-12-08 LAB — TSH: TSH: 2.86 u[IU]/mL (ref 0.35–4.50)

## 2020-12-12 ENCOUNTER — Other Ambulatory Visit: Payer: Medicare HMO

## 2020-12-15 ENCOUNTER — Ambulatory Visit (INDEPENDENT_AMBULATORY_CARE_PROVIDER_SITE_OTHER): Payer: Medicare HMO | Admitting: Endocrinology

## 2020-12-15 ENCOUNTER — Other Ambulatory Visit: Payer: Self-pay

## 2020-12-15 ENCOUNTER — Encounter: Payer: Self-pay | Admitting: Endocrinology

## 2020-12-15 VITALS — BP 130/80 | HR 72 | Resp 18 | Ht 62.0 in | Wt 196.6 lb

## 2020-12-15 DIAGNOSIS — E119 Type 2 diabetes mellitus without complications: Secondary | ICD-10-CM

## 2020-12-15 DIAGNOSIS — E042 Nontoxic multinodular goiter: Secondary | ICD-10-CM | POA: Diagnosis not present

## 2020-12-15 DIAGNOSIS — I10 Essential (primary) hypertension: Secondary | ICD-10-CM | POA: Diagnosis not present

## 2020-12-15 NOTE — Progress Notes (Signed)
Patient ID: NOTNAMED CROUCHER, female   DOB: 10-Aug-1941, 80 y.o.   MRN: [id number]   Reason for Appointment: follow-up   History of Present Illness   Type 2 DIABETES MELITUS, date of diagnosis 02/2012     She has had mild diabetes which has been well controlled with metformin ER alone. Has had no side effects from metformin  She also has had diabetes education in 2013 . Oral hypoglycemic drugs: Metformin ER using 750 mg tablets, 1.5 g daily.       Side effects from medications: None  Her A1c is about the same at 6.9  Fructosamine last 250  Current management, blood sugar patterns and problems identified:    Blood sugar meter could not be downloaded today  She is checking her blood sugar occasionally and mostly midday or afternoon  Her lunch is apparently with a larger meal  She is usually trying to watch her diet with cutting back on carbohydrates and fruits  Not able to do any exercise and has gained some weight   Monitors blood glucose:  less than once a day     Glucometer:  One Touch ultra 2  Blood Glucose readings from meter review  Last few readings as follows: 89-119  AVERAGE 121 for 30 days   Previous visits with dietitian: Several years ago  Dinner At 5-6 pm        Wt Readings from Last 3 Encounters:  12/15/20 196 lb 9.6 oz (89.2 kg)  08/11/20 191 lb 12.8 oz (87 kg)  04/08/20 188 lb 9.6 oz (85.5 kg)     Lab Results  Component Value Date   HGBA1C 6.9 (H) 12/07/2020   HGBA1C 7.1 (H) 08/08/2020   HGBA1C 6.6 (H) 04/05/2020   Lab Results  Component Value Date   MICROALBUR 2.1 (H) 08/08/2020   LDLCALC 75 08/08/2020   CREATININE 0.74 12/07/2020     Other active problems evaluated today: See review of systems  LABS:  No visits with results within 1 Week(s) from this visit.  Latest known visit with results is:  Lab on 12/07/2020  Component Date Value Ref Range Status  . Free T4 12/07/2020 0.80  0.60 - 1.60 ng/dL Final   Comment:  Specimens from patients who are undergoing biotin therapy and /or ingesting biotin supplements may contain high levels of biotin.  The higher biotin concentration in these specimens interferes with this Free T4 assay.  Specimens that contain high levels  of biotin may cause false high results for this Free T4 assay.  Please interpret results in light of the total clinical presentation of the patient.    Marland Kitchen TSH 12/07/2020 2.86  0.35 - 4.50 uIU/mL Final  . Sodium 12/07/2020 137  135 - 145 mEq/L Final  . Potassium 12/07/2020 3.8  3.5 - 5.1 mEq/L Final  . Chloride 12/07/2020 100  96 - 112 mEq/L Final  . CO2 12/07/2020 30  19 - 32 mEq/L Final  . Glucose, Bld 12/07/2020 119* 70 - 99 mg/dL Final  . BUN 12/07/2020 15  6 - 23 mg/dL Final  . Creatinine, Ser 12/07/2020 0.74  0.40 - 1.20 mg/dL Final  . GFR 12/07/2020 76.64  >60.00 mL/min Final   Calculated using the CKD-EPI Creatinine Equation (2021)  . Calcium 12/07/2020 10.3  8.4 - 10.5 mg/dL Final  . Hgb A1c MFr Bld 12/07/2020 6.9* 4.6 - 6.5 % Final   Glycemic Control Guidelines for People with Diabetes:Non Diabetic:  <6%Goal of Therapy: <7%Additional Action  Suggested:  >8%     Allergies as of 12/15/2020      Reactions   Ace Inhibitors Swelling, Other (See Comments)   Angioedema   Keflex [cephalexin] Swelling, Other (See Comments)   Lips became swollwn   Rifadin [rifampin] Swelling, Other (See Comments)   Lips became swollen      Medication List       Accurate as of December 15, 2020 10:14 AM. If you have any questions, ask your nurse or doctor.        acetaminophen 325 MG tablet Commonly known as: TYLENOL Take 325-650 mg by mouth every 8 (eight) hours as needed (for headaches).   amLODipine 5 MG tablet Commonly known as: NORVASC Take 5 mg by mouth daily.   diclofenac 1.3 % Ptch Commonly known as: FLECTOR Place 1 patch onto the skin 2 (two) times daily.   doxycycline 100 MG capsule Commonly known as: VIBRAMYCIN Take 100 mg by mouth  2 (two) times daily.   hydrochlorothiazide 25 MG tablet Commonly known as: HYDRODIURIL Take 25 mg by mouth daily.   hydrOXYzine 25 MG tablet Commonly known as: ATARAX/VISTARIL Take 25 mg by mouth 3 (three) times daily as needed.   metFORMIN 750 MG 24 hr tablet Commonly known as: GLUCOPHAGE-XR TAKE 2 TABLETS BY MOUTH ONCE DAILY WITH BREAKFAST   metoprolol succinate 50 MG 24 hr tablet Commonly known as: TOPROL-XL Take 50 mg by mouth daily.   ONE TOUCH ULTRA 2 w/Device Kit Use to check blood sugars three times daily DX: E11.9   OneTouch Ultra test strip Generic drug: glucose blood 1 each by Other route 3 (three) times daily. E11.9   onetouch ultrasoft lancets 1 each by Other route as needed for other. Use to check blood sugars 3X a day   simvastatin 20 MG tablet Commonly known as: ZOCOR Take 20 mg by mouth every evening.   SYSTANE OP Place 1 drop into both eyes 2 (two) times daily.   Vitamin D3 125 MCG (5000 UT) Caps Take 5,000 Units by mouth daily.       Allergies:  Allergies  Allergen Reactions  . Ace Inhibitors Swelling and Other (See Comments)    Angioedema   . Keflex [Cephalexin] Swelling and Other (See Comments)    Lips became swollwn  . Rifadin [Rifampin] Swelling and Other (See Comments)    Lips became swollen    Past Medical History:  Diagnosis Date  . Acute lower GI bleeding 05/2019  . Anemia   . Cardiac mass    a. 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 Not seen on most recent TEE 2019.  . Cardiomyopathy in other disease   . Cataracts, bilateral   . Diabetes mellitus    Type 2  . Generalized osteoarthritis   . GERD (gastroesophageal reflux disease)   . HLD (hyperlipidemia)   . Hypertension   . Hypothyroidism   . LBBB (left bundle branch block)   . PAF (paroxysmal atrial fibrillation) ([place])    a. s/p DCCV 06/2017, on Xarelto  . Phlebitis   . S/P TAVR (transcatheter aortic valve replacement) 04/15/2018   Edwards Sapien 3 THV (size 23 mm,  model # [id number], serial # [id number]) via the TF approach  . Severe aortic stenosis    a. s/p TAVR 04/2018.  . Stroke [facility]) 2008    Past Surgical History:  Procedure Laterality Date  . ABDOMINAL HYSTERECTOMY    . BIOPSY  05/19/2019   Procedure: BIOPSY;  Surgeon: Ronald Lobo, MD;  Location:  [facility] ENDOSCOPY;  Service: Endoscopy;;  . BREAST BIOPSY Left    years ago at "a doctor's office"  . COLONOSCOPY    . COLONOSCOPY WITH PROPOFOL N/A 10/02/2018   Procedure: COLONOSCOPY WITH PROPOFOL;  Surgeon: Arta Silence, MD;  Location: [place];  Service: Endoscopy;  Laterality: N/A;  . ENTEROSCOPY N/A 05/19/2019   Procedure: ENTEROSCOPY;  Surgeon: Ronald Lobo, MD;  Location: [facility] ENDOSCOPY;  Service: Endoscopy;  Laterality: N/A;  . ESOPHAGOGASTRODUODENOSCOPY (EGD) WITH PROPOFOL N/A 10/02/2018   Procedure: ESOPHAGOGASTRODUODENOSCOPY (EGD) WITH PROPOFOL;  Surgeon: Arta Silence, MD;  Location: [place];  Service: Endoscopy;  Laterality: N/A;  . ESOPHAGOGASTRODUODENOSCOPY (EGD) WITH PROPOFOL N/A 04/23/2019   Procedure: ESOPHAGOGASTRODUODENOSCOPY (EGD) WITH PROPOFOL;  Surgeon: Wonda Horner, MD;  Location: [facility] END[facility]COPY;  Service: Endoscopy;  Laterality: N/A;  . EYE SURGERY Bilateral    cataract removal  . GIVENS CAPSULE STUDY N/A 10/02/2018   Procedure: GIVENS CAPSULE STUDY;  Surgeon: Arta Silence, MD;  Location: [facility] ENDOSCOPY;  Service: Endoscopy;  Laterality: N/A;  . RIGHT/LEFT HEART CATH AND CORONARY ANGIOGRAPHY N/A 03/06/2018   Procedure: RIGHT/LEFT HEART CATH AND CORONARY ANGIOGRAPHY;  Surgeon: Belva Crome, MD;  Location: [place] CV LAB;  Service: Cardiovascular;  Laterality: N/A;  . SMALL BOWEL ENTEROSCOPY  05/19/2019  . TEE WITHOUT CARDIOVERSION N/A 04/01/2018   Procedure: TRANSESOPHAGEAL ECHOCARDIOGRAM (TEE);  Surgeon: Jerline Pain, MD;  Location: [facility] ENDOSCOPY;  Service: Cardiovascular;  Laterality: N/A;  . TEE WITHOUT CARDIOVERSION N/A 04/15/2018   Procedure: TRANSESOPHAGEAL  ECHOCARDIOGRAM (TEE);  Surgeon: Sherren Mocha, MD;  Location: [place];  Service: Open Heart Surgery;  Laterality: N/A;  . TOTAL KNEE ARTHROPLASTY Right 04/14/2013   Dr Marlou Sa  . TOTAL KNEE ARTHROPLASTY Right 04/14/2013   Procedure: TOTAL KNEE ARTHROPLASTY;  Surgeon: Meredith Pel, MD;  Location: [place];  Service: Orthopedics;  Laterality: Right;  . TRANSCATHETER AORTIC VALVE REPLACEMENT, TRANSFEMORAL  04/15/2018  . TRANSCATHETER AORTIC VALVE REPLACEMENT, TRANSFEMORAL Bilateral 04/15/2018   Procedure: TRANSCATHETER AORTIC VALVE REPLACEMENT, TRANSFEMORAL;  Surgeon: Sherren Mocha, MD;  Location: [place];  Service: Open Heart Surgery;  Laterality: Bilateral;    Family History  Problem Relation Age of Onset  . Stroke Mother   . Hypertension Mother   . Hypertension Father   . Heart attack Neg Hx     Social History:  reports that she has never smoked. She has never used smokeless tobacco. She reports that she does not drink alcohol and does not use drugs.  Review of Systems:  HYPERTENSION:  she has been treated with 5 mg amlodipine, 50 mg metoprolol and hydrochlorothiazide 25 mg, followed by PCP. She had swelling of the lips with ramipril and has not been prescribed an ARB   HYPERLIPIDEMIA: The lipid abnormality consists of elevated LDL treated with simvastatin 20 mg by PCP and LDL is controlled   LDL as follows:   Lab Results  Component Value Date   CHOL 136 08/08/2020   HDL 43.10 08/08/2020   LDLCALC 75 08/08/2020   TRIG 90.0 08/08/2020   CHOLHDL 3 08/08/2020    Multinodular goiter:  She has had a long-standing multinodular goiter, last ultrasound was done in 05/2012 which showed the largest nodule to be 4.1 cm nodule in the isthmus  She only has occasional difficulty with feeling of choking in her throat but no difficulty swallowing, this is not any worse  Findings on last exam: There is a 3.5 cm midline thyroid nodule which is relatively soft Right thyroid lobe is  rubbery and  about 2-2.5 times normal, left side is smaller  TSH has been normal  Lab Results  Component Value Date   TSH 2.86 12/07/2020    Last eye exam was in 12/2018, report not available  HYPERCALCEMIA: has had very occasional high readings Most recently calcium is 10.3 She does take 25 mg HCTZ PTH low normal   Lab Results  Component Value Date   CALCIUM 10.3 12/07/2020   CALCIUM 10.3 08/08/2020   CALCIUM 10.5 04/05/2020   CALCIUM 10.6 (H) 12/03/2019   CALCIUM 10.2 08/03/2019    Lab Results  Component Value Date   PTH 23 08/22/2017   CALCIUM 10.3 12/07/2020   CAION 1.14 (L) 04/15/2018     Examination: BP 130/80 (BP Location: Left Arm)   Pulse 72   Resp 18   Ht 5' 2"  (1.575 m)   Wt 196 lb 9.6 oz (89.2 kg)   SpO2 98%   BMI 35.96 kg/m   Body mass index is 35.96 kg/m.      ASSESSMENT/ PLAN:   Diabetes type 2 with obesity  See history of present illness for discussion of current diabetes management, blood sugar patterns and problems identified  She is on Metformin 1500 mg with consistently good control  A1c is now below 7% at 6.9  Her blood sugars at home are looking fairly normal although checking mostly midday and afternoon Has had no progression of her diabetes for several years She is not able to do much exercise because of back pain but encouraged her to consider walking in water Also she agrees to see the dietitian to control her weight gain   Multinodular goiter: This is euthyroid  Hypertension: Well-controlled  Elayne Snare 12/15/2020, 10:14 AM   Note: This office note was prepared with Dragon voice recognition system technology. Any transcriptional errors that result from this process are unintentional.

## 2021-01-13 ENCOUNTER — Other Ambulatory Visit: Payer: Self-pay | Admitting: *Deleted

## 2021-01-13 DIAGNOSIS — E1165 Type 2 diabetes mellitus with hyperglycemia: Secondary | ICD-10-CM

## 2021-01-13 MED ORDER — METFORMIN HCL ER 750 MG PO TB24: ORAL_TABLET | ORAL | 1 refills | Status: DC

## 2021-01-17 DIAGNOSIS — E119 Type 2 diabetes mellitus without complications: Secondary | ICD-10-CM | POA: Diagnosis not present

## 2021-01-17 DIAGNOSIS — Z0100 Encounter for examination of eyes and vision without abnormal findings: Secondary | ICD-10-CM | POA: Diagnosis not present

## 2021-01-17 LAB — HM DIABETES EYE EXAM

## 2021-01-23 ENCOUNTER — Encounter: Payer: Self-pay | Admitting: Endocrinology

## 2021-01-23 ENCOUNTER — Other Ambulatory Visit: Payer: Self-pay | Admitting: Endocrinology

## 2021-01-23 DIAGNOSIS — E119 Type 2 diabetes mellitus without complications: Secondary | ICD-10-CM

## 2021-02-02 ENCOUNTER — Encounter: Payer: Medicare HMO | Attending: Endocrinology | Admitting: Dietician

## 2021-02-02 ENCOUNTER — Other Ambulatory Visit: Payer: Self-pay

## 2021-02-02 ENCOUNTER — Encounter: Payer: Self-pay | Admitting: Dietician

## 2021-02-02 ENCOUNTER — Encounter: Payer: Self-pay | Admitting: Endocrinology

## 2021-02-02 DIAGNOSIS — Z794 Long term (current) use of insulin: Secondary | ICD-10-CM

## 2021-02-02 DIAGNOSIS — E0800 Diabetes mellitus due to underlying condition with hyperosmolarity without nonketotic hyperglycemic-hyperosmolar coma (NKHHC): Secondary | ICD-10-CM | POA: Diagnosis not present

## 2021-02-02 NOTE — Progress Notes (Signed)
Diabetes Self-Management Education  Visit Type: First/Initial  Appt. Start Time: 1035 Appt. End Time: 1135  02/02/2021  [name], identified by name and date of birth, is a 80 y.o. female with a diagnosis of Diabetes: Type 2.   ASSESSMENT Patient is here today alone.  She has been gaining weight since she has been unable to exercise.  She walks with a cane and has arthritis in both knees. She would like to lose weight.  History includes:  Type 2 Diabetes (2013), HTN, HLD, hypothyroid,GERD A1C 6.9% 12/07/2020 Medications include Metformin Weight gain from 188 lbs 04/2020 to 196 lbs 12/2020, 195 lbs 02/02/2021  Patient lives alone.  She is retired and used to work at [employer]. She does her own shopping.  She avoids rice, pasta, red meat, fried foods.  Weight 195 lb (88.5 kg). Body mass index is 35.67 kg/m.   Diabetes Self-Management Education - 02/02/21 1046      Visit Information   Visit Type First/Initial      Initial Visit   Diabetes Type Type 2    Are you currently following a meal plan? No    Are you taking your medications as prescribed? Yes    Date Diagnosed 2013      Health Coping   How would you rate your overall health? Fair      Psychosocial Assessment   Patient Belief/Attitude about Diabetes Afraid    Self-care barriers None    Self-management support Doctor's office    Other persons present Patient    Patient Concerns Nutrition/Meal planning;Weight Control    Special Needs None    Preferred Learning Style No preference indicated    Learning Readiness Ready    How often do you need to have someone help you when you read instructions, pamphlets, or other written materials from your doctor or pharmacy? 1 - Never    What is the last grade level you completed in school? 11      Pre-Education Assessment   Patient understands the diabetes disease and treatment process. Needs Review    Patient understands incorporating nutritional management into  lifestyle. Needs Review    Patient undertands incorporating physical activity into lifestyle. Needs Review    Patient understands using medications safely. Needs Review    Patient understands monitoring blood glucose, interpreting and using results Needs Review    Patient understands prevention, detection, and treatment of acute complications. Needs Review    Patient understands prevention, detection, and treatment of chronic complications. Needs Review    Patient understands how to develop strategies to address psychosocial issues. Needs Review    Patient understands how to develop strategies to promote health/change behavior. Needs Review      Complications   Last HgB A1C per patient/outside source 6.9 %   12/17/20   How often do you check your blood sugar? 1-2 times/day    Fasting Blood glucose range (mg/dL) 14-239    Have you had a dilated eye exam in the past 12 months? Yes    Have you had a dental exam in the past 12 months? No    Are you checking your feet? Yes    How many days per week are you checking your feet? 2      Dietary Intake   Breakfast Premier protein shake, banana, yogurt    Snack (morning) none    Lunch [place] sandwich on [employer] with small amt reg mayo, apple or orange    Snack (afternoon) none  Dinner baked chicken, broccoli, fruit cup OR baked fish, collard greens or cabbage, sweet potatoes   6   Snack (evening) none    Beverage(s) water or unsweetened green tea      Exercise   Exercise Type ADL's    How many days per week to you exercise? 0    How many minutes per day do you exercise? 0    Total minutes per week of exercise 0      Patient Education   Previous Diabetes Education No    Disease state  Definition of diabetes, type 1 and 2, and the diagnosis of diabetes    Nutrition management  Role of diet in the treatment of diabetes and the relationship between the three main macronutrients and blood glucose level;Meal options for control of blood glucose level  and chronic complications.    Physical activity and exercise  Role of exercise on diabetes management, blood pressure control and cardiac health.;Helped patient identify appropriate exercises in relation to his/her diabetes, diabetes complications and other health issue.    Medications Reviewed patients medication for diabetes, action, purpose, timing of dose and side effects.    Monitoring Taught/discussed recording of test results and interpretation of SMBG.;Identified appropriate SMBG and/or A1C goals.;Yearly dilated eye exam;Daily foot exams    Acute complications Taught treatment of hypoglycemia - the 15 rule.    Chronic complications Relationship between chronic complications and blood glucose control      Individualized Goals (developed by patient)   Nutrition General guidelines for healthy choices and portions discussed    Physical Activity Exercise 5-7 days per week;15 minutes per day    Medications take my medication as prescribed    Monitoring  test my blood glucose as discussed    Reducing Risk increase portions of healthy fats      Post-Education Assessment   Patient understands the diabetes disease and treatment process. Demonstrates understanding / competency    Patient understands incorporating nutritional management into lifestyle. Demonstrates understanding / competency    Patient undertands incorporating physical activity into lifestyle. Demonstrates understanding / competency    Patient understands using medications safely. Demonstrates understanding / competency    Patient understands monitoring blood glucose, interpreting and using results Demonstrates understanding / competency    Patient understands prevention, detection, and treatment of acute complications. Demonstrates understanding / competency    Patient understands prevention, detection, and treatment of chronic complications. Demonstrates understanding / competency    Patient understands how to develop strategies  to address psychosocial issues. Demonstrates understanding / competency    Patient understands how to develop strategies to promote health/change behavior. Demonstrates understanding / competency      Outcomes   Expected Outcomes Demonstrated interest in learning. Expect positive outcomes    Future DMSE PRN    Program Status Completed           Individualized Plan for Diabetes Self-Management Training:   Learning Objective:  Patient will have a greater understanding of diabetes self-management. Patient education plan is to attend individual and/or group sessions per assessed needs and concerns.   Plan:   Patient Instructions  Stay active - aim for 30 minutes most day.  Start slow.  [employer] officer, trade [employer]  [employer]  Arm Chair exercise  Listen to your body and stop when satisfied. Take the skin off your chicken. Increase your non starchy vegetable intake. Consider protein shake or yogurt for breakfast instead of both.     Expected Outcomes:  Demonstrated  interest in learning. Expect positive outcomes  Education material provided: ADA - How to Thrive: A Guide for Your Journey with Diabetes and Diabetes Resources  If problems or questions, patient to contact team via:  Phone  Future DSME appointment: PRN

## 2021-02-02 NOTE — Patient Instructions (Signed)
Stay active - aim for 30 minutes most day.  Start slow.  [employer] officer, trade union  You Tube  Arm Chair exercise  Listen to your body and stop when satisfied. Take the skin off your chicken. Increase your non starchy vegetable intake. Consider protein shake or yogurt for breakfast instead of both.

## 2021-03-28 DIAGNOSIS — K1120 Sialoadenitis, unspecified: Secondary | ICD-10-CM | POA: Diagnosis not present

## 2021-04-21 ENCOUNTER — Other Ambulatory Visit: Payer: Self-pay

## 2021-04-21 ENCOUNTER — Ambulatory Visit (INDEPENDENT_AMBULATORY_CARE_PROVIDER_SITE_OTHER): Payer: Medicare HMO | Admitting: Cardiology

## 2021-04-21 ENCOUNTER — Encounter: Payer: Self-pay | Admitting: Cardiology

## 2021-04-21 VITALS — BP 136/74 | HR 67 | Ht 62.0 in | Wt 195.6 lb

## 2021-04-21 DIAGNOSIS — I10 Essential (primary) hypertension: Secondary | ICD-10-CM | POA: Diagnosis not present

## 2021-04-21 DIAGNOSIS — I480 Paroxysmal atrial fibrillation: Secondary | ICD-10-CM

## 2021-04-21 DIAGNOSIS — Z952 Presence of prosthetic heart valve: Secondary | ICD-10-CM | POA: Diagnosis not present

## 2021-04-21 DIAGNOSIS — I447 Left bundle-branch block, unspecified: Secondary | ICD-10-CM | POA: Diagnosis not present

## 2021-04-21 NOTE — Progress Notes (Signed)
Cardiology Office Note:    Date:  04/21/2021   ID:  Janet Williamson, DOB 1941/06/09, MRN [id number]  PCP:  Janet Pillar, MD   [facility] Providers Cardiologist:  Janet Furbish, MD     Referring MD: Janet Pillar, MD     History of Present Illness:    Janet Williamson is a 80 y.o. female here for follow-up of TAVR July 2019 left bundle branch block mitral annular calcification exuberant, diabetes paroxysmal atrial fibrillation.  She had a prior embolic stroke in 4332.  She had GI bleed in 2019 with hemoglobin of 5.  At that time, Xarelto risk was too high.  Risk of bleeding outweighed the use of Xarelto.  She has had at times neck discomfort felt to be musculoskeletal.  Echocardiogram showed stable transthoracic gradients.    Has felt some leg burning at times, right leg.  Also has arthritis.  Main complaint.  She has not had any further melena symptoms.  No chest Williamson no shortness of breath.  No syncope.  No new strokelike symptoms.  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 Not seen on most recent TEE 2019.   Cardiomyopathy in other disease    Cataracts, bilateral    Diabetes mellitus    Type 2   Generalized osteoarthritis    GERD (gastroesophageal reflux disease)    HLD (hyperlipidemia)    Hypertension    Hypothyroidism    LBBB (left bundle branch block)    PAF (paroxysmal atrial fibrillation) ([place])    a. s/p DCCV 06/2017, on Xarelto   Phlebitis    S/P TAVR (transcatheter aortic valve replacement) 04/15/2018   Edwards Sapien 3 THV (size 23 mm, model # 9600TFX, serial # [id number]) via the TF approach   Severe aortic stenosis    a. s/p TAVR 04/2018.   Stroke [facility]) 2008    Past Surgical History:  Procedure Laterality Date   ABDOMINAL HYSTERECTOMY     BIOPSY  05/19/2019   Procedure: BIOPSY;  Surgeon: Janet Lobo, MD;  Location: [place] ENDOSCOPY;  Service:  Endoscopy;;   BREAST BIOPSY Left    years ago at "a doctor's office"   COLONOSCOPY     COLONOSCOPY WITH PROPOFOL N/A 10/02/2018   Procedure: COLONOSCOPY WITH PROPOFOL;  Surgeon: Janet Silence, MD;  Location: [place];  Service: Endoscopy;  Laterality: N/A;   ENTEROSCOPY N/A 05/19/2019   Procedure: ENTEROSCOPY;  Surgeon: Janet Lobo, MD;  Location: [place];  Service: Endoscopy;  Laterality: N/A;   ESOPHAGOGASTRODUODENOSCOPY (EGD) WITH PROPOFOL N/A 10/02/2018   Procedure: ESOPHAGOGASTRODUODENOSCOPY (EGD) WITH PROPOFOL;  Surgeon: Janet Silence, MD;  Location: [place];  Service: Endoscopy;  Laterality: N/A;   ESOPHAGOGASTRODUODENOSCOPY (EGD) WITH PROPOFOL N/A 04/23/2019   Procedure: ESOPHAGOGASTRODUODENOSCOPY (EGD) WITH PROPOFOL;  Surgeon: Janet Horner, MD;  Location[facility] ENDOSCOPY;  Service: Endoscopy;  Laterality: N/A;   EYE SURGERY Bilateral    cataract removal   GIVENS CAPSULE STUDY N/A 10/02/2018   Procedure: GIVENS CAPSULE STUDY;  Surgeon: Janet Silence, MD;  Location: [facility] ENDOSCOPY;  Service: Endoscopy;  Laterality: N/A;   RIGHT/LEFT HEART CATH AND CORONARY ANGIOGRAPHY N/A 03/06/2018   Procedure: RIGHT/LEFT HEART CATH AND CORONARY ANGIOGRAPHY;  Surgeon: Janet Crome, MD;  Location: [place] CV LAB;  Service: Cardiovascular;  Laterality: N/A;   SMALL BOWEL ENTEROSCOPY  05/19/2019   TEE WITHOUT CARDIOVERSION N/A 04/01/2018  Procedure: TRANSESOPHAGEAL ECHOCARDIOGRAM (TEE);  Surgeon: Janet Pain, MD;  Location: [facility] ENDOSCOPY;  Service: Cardiovascular;  Laterality: N/A;   TEE WITHOUT CARDIOVERSION N/A 04/15/2018   Procedure: TRANSESOPHAGEAL ECHOCARDIOGRAM (TEE);  Surgeon: Janet Mocha, MD;  Location: [place];  Service: Open Heart Surgery;  Laterality: N/A;   TOTAL KNEE ARTHROPLASTY Right 04/14/2013   Dr Janet Williamson   TOTAL KNEE ARTHROPLASTY Right 04/14/2013   Procedure: TOTAL KNEE ARTHROPLASTY;  Surgeon: Janet Pel, MD;  Location: [place];  Service: Orthopedics;   Laterality: Right;   TRANSCATHETER AORTIC VALVE REPLACEMENT, TRANSFEMORAL  04/15/2018   TRANSCATHETER AORTIC VALVE REPLACEMENT, TRANSFEMORAL Bilateral 04/15/2018   Procedure: TRANSCATHETER AORTIC VALVE REPLACEMENT, TRANSFEMORAL;  Surgeon: Janet Mocha, MD;  Location: [place];  Service: Open Heart Surgery;  Laterality: Bilateral;    Current Medications: Current Meds  Medication Sig   acetaminophen (TYLENOL) 325 MG tablet Take 325-650 mg by mouth every 8 (eight) hours as needed (for headaches).    amLODipine (NORVASC) 5 MG tablet Take 5 mg by mouth daily.   Blood Glucose Monitoring Suppl (ACCU-CHEK GUIDE ME) w/Device KIT by Does not apply route.   Blood Glucose Monitoring Suppl (ONE TOUCH ULTRA 2) w/Device KIT Use to check blood sugars three times daily DX: E11.9   Cholecalciferol (VITAMIN D3) 5000 units CAPS Take 5,000 Units by mouth daily.   hydrochlorothiazide (HYDRODIURIL) 25 MG tablet Take 25 mg by mouth daily.    hydrOXYzine (ATARAX/VISTARIL) 25 MG tablet Take 25 mg by mouth 3 (three) times daily as needed.   Lancets (ONETOUCH DELICA PLUS [id number]) MISC TEST BLOOD SUGAR THREE TIMES DAILY   metFORMIN (GLUCOPHAGE-XR) 750 MG 24 hr tablet TAKE 2 TABLETS BY MOUTH ONCE DAILY WITH BREAKFAST   metoprolol succinate (TOPROL-XL) 50 MG 24 hr tablet Take 50 mg by mouth daily.    ONETOUCH ULTRA test strip TEST BLOOD SUGAR THREE TIMES DAILY   Polyethyl Glycol-Propyl Glycol (SYSTANE OP) Place 1 drop into both eyes 2 (two) times daily.    simvastatin (ZOCOR) 20 MG tablet Take 20 mg by mouth every evening.     Allergies:   Ace inhibitors, Keflex [cephalexin], and Rifadin [rifampin]   Social History   Socioeconomic History   Marital status: Widowed    Spouse name: Not on file   Number of children: Not on file   Years of education: Not on file   Highest education level: Not on file  Occupational History   Not on file  Tobacco Use   Smoking status: Never   Smokeless tobacco: Never  Vaping Use    Vaping Use: Never used  Substance and Sexual Activity   Alcohol use: No   Drug use: No   Sexual activity: Not Currently  Other Topics Concern   Not on file  Social History Narrative   Not on file   Social Determinants of Health   Financial Resource Strain: Not on file  Food Insecurity: Not on file  Transportation Needs: Not on file  Physical Activity: Not on file  Stress: Not on file  Social Connections: Not on file     Family History: The patient's family history includes Hypertension in her father and mother; Stroke in her mother. There is no history of Heart attack.  ROS:   Please see the history of present illness.     All other systems reviewed and are negative.  EKGs/Labs/Other Studies Reviewed:    The following studies were reviewed today:  ECHO 2020:    TAVR with a 23  mm Edwards Sapien 3 THV via the TF approach on  04/15/18. Transaortic gradients are elevated but not significantly  changed from the prior study, now mean 30 mmHg, previously 24 mmHg,  peak velocity now 3.27 m/s, previously 3.24 m/s.   EKG:  EKG is ordered today.  The ekg ordered today demonstrates sinus bradycardia 67 left bundle branch block.  Recent Labs: 08/08/2020: ALT 9 12/07/2020: BUN 15; Creatinine, Ser 0.74; Potassium 3.8; Sodium 137; TSH 2.86  Recent Lipid Panel    Component Value Date/Time   CHOL 136 08/08/2020 0918   CHOL 145 10/17/2018 1255   TRIG 90.0 08/08/2020 0918   HDL 43.10 08/08/2020 0918   HDL 47 10/17/2018 1255   CHOLHDL 3 08/08/2020 0918   VLDL 18.0 08/08/2020 0918   LDLCALC 75 08/08/2020 0918   LDLCALC 80 10/17/2018 1255     Risk Assessment/Calculations:          Physical Exam:    VS:  BP 136/74   Pulse 67   Ht _0  (1.575 m)   Wt 195 lb 9.6 oz (88.7 kg)   SpO2 98%   BMI 35.78 kg/m     Wt Readings from Last 3 Encounters:  04/21/21 195 lb 9.6 oz (88.7 kg)  02/02/21 195 lb (88.5 kg)  12/15/20 196 lb 9.6 oz (89.2 kg)     GEN:  Well nourished, well  developed in no acute distress HEENT: Normal NECK: No JVD; No carotid bruits LYMPHATICS: No lymphadenopathy CARDIAC: RRR, no murmurs, rubs, gallops RESPIRATORY:  Clear to auscultation without rales, wheezing or rhonchi  ABDOMEN: Soft, non-tender, non-distended MUSCULOSKELETAL:  No edema; No deformity  SKIN: Warm and dry NEUROLOGIC:  Alert and oriented x 3 PSYCHIATRIC:  Normal affect   ASSESSMENT:    1. PAF (paroxysmal atrial fibrillation) ([place])   2. S/P TAVR (transcatheter aortic valve replacement)   3. Essential hypertension   4. LBBB (left bundle branch block)    PLAN:    In order of problems listed above:  Paroxysmal atrial fibrillation - Currently in sinus rhythm with left under much block.  Once again had a lengthy discussion regarding chronic anticoagulation.  Since her hemoglobin was 5 while on Xarelto with significant GI bleed and after discussion with gastroenterology, risks of anticoagulation outweigh benefit.  She would not be a good candidate for watchman device.  TAVR valve severe aortic stenosis - Doing well stable mean gradient on echocardiogram from 24-30.  Fairly stable.  No symptoms.  Prior echocardiogram reviewed.  Left bundle branch block - Stable no syncope.  Doing well.  Prior stroke - Once again not a candidate for anticoagulation.  Continue with goal-directed medical therapy hypertension control which includes Norvasc 5 mg once a day, hydrochlorothiazide 25 mg a day metoprolol succinate 50 mg a day and simvastatin 20 mg every evening.  Refills as needed.  Continue with current medical management.  LDL 75 HDL 43 hemoglobin A1c 6.9 creatinine 0.7 potassium 3.8 ALT 9 last hemoglobin 11.2      Medication Adjustments/Labs and Tests Ordered: Current medicines are reviewed at length with the patient today.  Concerns regarding medicines are outlined above.  Orders Placed This Encounter  Procedures   EKG 12-Lead    No orders of the defined types were placed  in this encounter.   Patient Instructions  Medication Instructions:  The current medical regimen is effective;  continue present plan and medications.  *If you need a refill on your cardiac medications before your next appointment, please  call your pharmacy*  Follow-Up: At [facility], you and your health needs are our priority.  As part of our continuing mission to provide you with exceptional heart care, we have created designated Provider Care Teams.  These Care Teams include your primary Cardiologist (physician) and Advanced Practice Providers (APPs -  Physician Assistants and Nurse Practitioners) who all work together to provide you with the care you need, when you need it.  We recommend signing up for the patient portal called "MyChart".  Sign up information is provided on this After Visit Summary.  MyChart is used to connect with patients for Virtual Visits (Telemedicine).  Patients are able to view lab/test results, encounter notes, upcoming appointments, etc.  Non-urgent messages can be sent to your provider as well.   To learn more about what you can do with MyChart, go to [email or web address].    Your next appointment:   1 year(s)  The format for your next appointment:   In Person  Provider:   [name], MD  Thank you for choosing [facility]!!     Signed, Janet Furbish, MD  04/21/2021 9:24 AM    [place]

## 2021-04-21 NOTE — Patient Instructions (Signed)
Medication Instructions:  The current medical regimen is effective;  continue present plan and medications.  *If you need a refill on your cardiac medications before your next appointment, please call your pharmacy*  Follow-Up: At [facility], you and your health needs are our priority.  As part of our continuing mission to provide you with exceptional heart care, we have created designated Provider Care Teams.  These Care Teams include your primary Cardiologist (physician) and Advanced Practice Providers (APPs -  Physician Assistants and Nurse Practitioners) who all work together to provide you with the care you need, when you need it.  We recommend signing up for the patient portal called "MyChart".  Sign up information is provided on this After Visit Summary.  MyChart is used to connect with patients for Virtual Visits (Telemedicine).  Patients are able to view lab/test results, encounter notes, upcoming appointments, etc.  Non-urgent messages can be sent to your provider as well.   To learn more about what you can do with MyChart, go to [email or web address].    Your next appointment:   1 year(s)  The format for your next appointment:   In Person  Provider:   Mark Skains, MD   Thank you for choosing [facility]!!    

## 2021-06-19 ENCOUNTER — Other Ambulatory Visit: Payer: Self-pay | Admitting: Endocrinology

## 2021-06-20 ENCOUNTER — Other Ambulatory Visit: Payer: Self-pay

## 2021-06-20 ENCOUNTER — Other Ambulatory Visit (INDEPENDENT_AMBULATORY_CARE_PROVIDER_SITE_OTHER): Payer: Medicare HMO

## 2021-06-20 DIAGNOSIS — E119 Type 2 diabetes mellitus without complications: Secondary | ICD-10-CM | POA: Diagnosis not present

## 2021-06-20 LAB — BASIC METABOLIC PANEL
BUN: 14 mg/dL (ref 6–23)
CO2: 32 mEq/L (ref 19–32)
Calcium: 10.2 mg/dL (ref 8.4–10.5)
Chloride: 99 mEq/L (ref 96–112)
Creatinine, Ser: 0.77 mg/dL (ref 0.40–1.20)
GFR: 72.8 mL/min (ref >60.00)
Glucose, Bld: 109 mg/dL — ABNORMAL HIGH (ref 70–99)
Potassium: 3.6 mEq/L (ref 3.5–5.1)
Sodium: 136 mEq/L (ref 135–145)

## 2021-06-20 LAB — HEMOGLOBIN A1C: Hgb A1c MFr Bld: 6.9 % — ABNORMAL HIGH (ref 4.6–6.5)

## 2021-06-22 ENCOUNTER — Other Ambulatory Visit: Payer: Self-pay

## 2021-06-22 ENCOUNTER — Ambulatory Visit (INDEPENDENT_AMBULATORY_CARE_PROVIDER_SITE_OTHER): Payer: Medicare HMO | Admitting: Endocrinology

## 2021-06-22 ENCOUNTER — Encounter: Payer: Self-pay | Admitting: Endocrinology

## 2021-06-22 VITALS — BP 140/72 | HR 62 | Ht 62.0 in | Wt 190.8 lb

## 2021-06-22 DIAGNOSIS — E7800 Pure hypercholesterolemia, unspecified: Secondary | ICD-10-CM

## 2021-06-22 DIAGNOSIS — E119 Type 2 diabetes mellitus without complications: Secondary | ICD-10-CM

## 2021-06-22 NOTE — Progress Notes (Signed)
Patient ID: Janet Williamson, female   DOB: [birth year], 80 y.o.   MRN: [id number]   Reason for Appointment: follow-up   History of Present Illness   Type 2 DIABETES MELITUS, date of diagnosis 02/2012     She has had mild diabetes which has been well controlled with metformin ER alone. Has had no side effects from metformin  She also has had diabetes education in 2013 . Oral hypoglycemic drugs: Metformin ER using 750 mg tablets, 1.5 g daily.       Side effects from medications: None  Her A1c is the same at 6.9  Fructosamine last 250  Current management, blood sugar patterns and problems identified:   Blood sugar meter was not brought in by patient today Previously would check her blood sugar mostly midday  She reports mostly readings near normal afternoon Her lunch is her largest meal  She has lost 5 pounds recently with trying to cut back on fried food, overall portions and fruits  Benefiting from seeing the dietitian in April No side effects from metformin Not able to do any exercise     Monitors blood glucose:  less than once a day     Glucometer:  One Touch ultra 2  Blood Glucose readings from recall up to 110  Last few readings as follows: 89-119  AVERAGE 121 for 30 days   Previous visits with dietitian: 4/22      Wt Readings from Last 3 Encounters:  06/22/21 190 lb 12.8 oz (86.5 kg)  04/21/21 195 lb 9.6 oz (88.7 kg)  02/02/21 195 lb (88.5 kg)     Lab Results  Component Value Date   HGBA1C 6.9 (H) 06/20/2021   HGBA1C 6.9 (H) 12/07/2020   HGBA1C 7.1 (H) 08/08/2020   Lab Results  Component Value Date   MICROALBUR 2.1 (H) 08/08/2020   LDLCALC 75 08/08/2020   CREATININE 0.77 06/20/2021     Other active problems evaluated today: See review of systems  LABS:  Lab on 06/20/2021  Component Date Value Ref Range Status   Sodium 06/20/2021 136  135 - 145 mEq/L Final   Potassium 06/20/2021 3.6  3.5 - 5.1 mEq/L Final   Chloride 06/20/2021 99   96 - 112 mEq/L Final   CO2 06/20/2021 32  19 - 32 mEq/L Final   Glucose, Bld 06/20/2021 109 (A) 70 - 99 mg/dL Final   BUN 06/20/2021 14  6 - 23 mg/dL Final   Creatinine, Ser 06/20/2021 0.77  0.40 - 1.20 mg/dL Final   GFR 06/20/2021 72.80  >60.00 mL/min Final   Calculated using the CKD-EPI Creatinine Equation (2021)   Calcium 06/20/2021 10.2  8.4 - 10.5 mg/dL Final   Hgb A1c MFr Bld 06/20/2021 6.9 (A) 4.6 - 6.5 % Final   Glycemic Control Guidelines for People with Diabetes:Non Diabetic:  <6%Goal of Therapy: <7%Additional Action Suggested:  >8%     Allergies as of 06/22/2021       Reactions   Ace Inhibitors Swelling, Other (See Comments)   Angioedema   Keflex [cephalexin] Swelling, Other (See Comments)   Lips became swollwn   Rifadin [rifampin] Swelling, Other (See Comments)   Lips became swollen        Medication List        Accurate as of June 22, 2021 10:29 AM. If you have any questions, ask your nurse or doctor.          acetaminophen 325 MG tablet Commonly known as: TYLENOL Take  325-650 mg by mouth every 8 (eight) hours as needed (for headaches).   amLODipine 5 MG tablet Commonly known as: NORVASC Take 5 mg by mouth daily.   diclofenac 1.3 % Ptch Commonly known as: FLECTOR Place 1 patch onto the skin 2 (two) times daily.   doxycycline 100 MG capsule Commonly known as: VIBRAMYCIN Take 100 mg by mouth 2 (two) times daily.   hydrochlorothiazide 25 MG tablet Commonly known as: HYDRODIURIL Take 25 mg by mouth daily.   hydrOXYzine 25 MG tablet Commonly known as: ATARAX/VISTARIL Take 25 mg by mouth 3 (three) times daily as needed.   metFORMIN 750 MG 24 hr tablet Commonly known as: GLUCOPHAGE-XR TAKE 2 TABLETS BY MOUTH ONCE DAILY WITH BREAKFAST   metoprolol succinate 50 MG 24 hr tablet Commonly known as: TOPROL-XL Take 50 mg by mouth daily.   Accu-Chek Guide Me w/Device Kit by Does not apply route.   ONE TOUCH ULTRA 2 w/Device Kit Use to check  blood sugars three times daily DX: S97.0   OneTouch Delica Plus [id number] Misc TEST BLOOD SUGAR THREE TIMES DAILY   OneTouch Ultra test strip Generic drug: glucose blood TEST BLOOD SUGAR THREE TIMES DAILY   simvastatin 20 MG tablet Commonly known as: ZOCOR Take 20 mg by mouth every evening.   SYSTANE OP Place 1 drop into both eyes 2 (two) times daily.   Vitamin D3 125 MCG (5000 UT) Caps Take 5,000 Units by mouth daily.        Allergies:  Allergies  Allergen Reactions   Ace Inhibitors Swelling and Other (See Comments)    Angioedema    Keflex [Cephalexin] Swelling and Other (See Comments)    Lips became swollwn   Rifadin [Rifampin] Swelling and Other (See Comments)    Lips became swollen    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 Not seen on most recent TEE 2019.   Cardiomyopathy in other disease    Cataracts, bilateral    Diabetes mellitus    Type 2   Generalized osteoarthritis    GERD (gastroesophageal reflux disease)    HLD (hyperlipidemia)    Hypertension    Hypothyroidism    LBBB (left bundle branch block)    PAF (paroxysmal atrial fibrillation) ([place])    a. s/p DCCV 06/2017, on Xarelto   Phlebitis    S/P TAVR (transcatheter aortic valve replacement) 04/15/2018   Edwards Sapien 3 THV (size 23 mm, model # [id number], serial # [id number]) via the TF approach   Severe aortic stenosis    a. s/p TAVR 04/2018.   Stroke [facility]) 2008    Past Surgical History:  Procedure Laterality Date   ABDOMINAL HYSTERECTOMY     BIOPSY  05/19/2019   Procedure: BIOPSY;  Surgeon: Ronald Lobo, MD;  Location: [place] ENDOSCOPY;  Service: Endoscopy;;   BREAST BIOPSY Left    years ago at "a doctor's office"   COLONOSCOPY     COLONOSCOPY WITH PROPOFOL N/A 10/02/2018   Procedure: COLONOSCOPY WITH PROPOFOL;  Surgeon: Arta Silence, MD;  Location: [place];  Service: Endoscopy;   Laterality: N/A;   ENTEROSCOPY N/A 05/19/2019   Procedure: ENTEROSCOPY;  Surgeon: Ronald Lobo, MD;  Location: [place];  Service: Endoscopy;  Laterality: N/A;   ESOPHAGOGASTRODUODENOSCOPY (EGD) WITH PROPOFOL N/A 10/02/2018   Procedure: ESOPHAGOGASTRODUODENOSCOPY (EGD) WITH PROPOFOL;  Surgeon: Arta Silence, MD;  Location: [place];  Service: Endoscopy;  Laterality: N/A;   ESOPHAGOGASTRODUODENOSCOPY (EGD) WITH PROPOFOL N/A 04/23/2019   Procedure: ESOPHAGOGASTRODUODENOSCOPY (EGD) WITH PROPOFOL;  Surgeon: Wonda Horner, MD;  Location: [facility] ENDOSCOPY;  Service: Endoscopy;  Laterality: N/A;   EYE SURGERY Bilateral    cataract removal   GIVENS CAPSULE STUDY N/A 10/02/2018   Procedure: GIVENS CAPSULE STUDY;  Surgeon: Arta Silence, MD;  Location: [facility] ENDOSCOPY;  Service: Endoscopy;  Laterality: N/A;   RIGHT/LEFT HEART CATH AND CORONARY ANGIOGRAPHY N/A 03/06/2018   Procedure: RIGHT/LEFT HEART CATH AND CORONARY ANGIOGRAPHY;  Surgeon: Belva Crome, MD;  Location: [place] CV LAB;  Service: Cardiovascular;  Laterality: N/A;   SMALL BOWEL ENTEROSCOPY  05/19/2019   TEE WITHOUT CARDIOVERSION N/A 04/01/2018   Procedure: TRANSESOPHAGEAL ECHOCARDIOGRAM (TEE);  Surgeon: Jerline Pain, MD;  Location: [facility] ENDOSCOPY;  Service: Cardiovascular;  Laterality: N/A;   TEE WITHOUT CARDIOVERSION N/A 04/15/2018   Procedure: TRANSESOPHAGEAL ECHOCARDIOGRAM (TEE);  Surgeon: Sherren Mocha, MD;  Location: [place];  Service: Open Heart Surgery;  Laterality: N/A;   TOTAL KNEE ARTHROPLASTY Right 04/14/2013   Dr Marlou Sa   TOTAL KNEE ARTHROPLASTY Right 04/14/2013   Procedure: TOTAL KNEE ARTHROPLASTY;  Surgeon: Meredith Pel, MD;  Location: [place];  Service: Orthopedics;  Laterality: Right;   TRANSCATHETER AORTIC VALVE REPLACEMENT, TRANSFEMORAL  04/15/2018   TRANSCATHETER AORTIC VALVE REPLACEMENT, TRANSFEMORAL Bilateral 04/15/2018   Procedure: TRANSCATHETER AORTIC VALVE REPLACEMENT, TRANSFEMORAL;  Surgeon: Sherren Mocha,  MD;  Location: [place];  Service: Open Heart Surgery;  Laterality: Bilateral;    Family History  Problem Relation Age of Onset   Stroke Mother    Hypertension Mother    Hypertension Father    Heart attack Neg Hx     Social History:  reports that she has never smoked. She has never used smokeless tobacco. She reports that she does not drink alcohol and does not use drugs.  Review of Systems:  HYPERTENSION:  she has been treated with 5 mg amlodipine, 50 mg metoprolol and hydrochlorothiazide 25 mg, followed by PCP. She had swelling of the lips with ramipril and has not been prescribed an ARB   HYPERLIPIDEMIA: The lipid abnormality consists of elevated LDL treated with simvastatin 20 mg by PCP and LDL is controlled  No recent labs available  LDL as follows:   Lab Results  Component Value Date   CHOL 136 08/08/2020   HDL 43.10 08/08/2020   LDLCALC 75 08/08/2020   TRIG 90.0 08/08/2020   CHOLHDL 3 08/08/2020    Multinodular goiter:  She has had a long-standing multinodular goiter, last ultrasound was done in 05/2012 which showed the largest nodule to be 4.1 cm nodule in the isthmus  She only has occasional difficulty with feeling of choking in her throat but no difficulty swallowing, this is not any worse  Findings on last exam: There is a 3.5 cm midline thyroid nodule which is relatively soft Right thyroid lobe is rubbery and about 2-2.5 times normal, left side is smaller  TSH has been normal  Lab Results  Component Value Date   TSH 2.86 12/07/2020    Last eye exam was in 12/2018, report not available  HYPERCALCEMIA: has had occasional high levels just above normal Recently appears to have only upper normal readings She does take 25 mg HCTZ PTH low normal previously   Lab Results  Component Value Date   CALCIUM 10.2 06/20/2021   CALCIUM 10.3 12/07/2020   CALCIUM 10.3 08/08/2020   CALCIUM 10.5 04/05/2020  CALCIUM 10.6 (H) 12/03/2019    Lab Results  Component  Value Date   PTH 23 08/22/2017   CALCIUM 10.2 06/20/2021   CAION 1.14 (L) 04/15/2018     Examination: BP 140/72   Pulse 62   Ht _0  (1.575 m)   Wt 190 lb 12.8 oz (86.5 kg)   SpO2 98%   BMI 34.90 kg/m   Body mass index is 34.9 kg/m.   Diabetic Foot Exam - Simple   Simple Foot Form Diabetic Foot exam was performed with the following findings: Yes   Visual Inspection No deformities, no ulcerations, no other skin breakdown bilaterally: Yes Sensation Testing Intact to touch and monofilament testing bilaterally: Yes Pulse Check Posterior Tibialis and Dorsalis pulse intact bilaterally: Yes Comments       ASSESSMENT/ PLAN:   Diabetes type 2 with obesity  See history of present illness for discussion of current diabetes management, blood sugar patterns and problems identified  She is on Metformin 1500 mg with consistently good control  A1c is consistently below 7% at 6.9  She has done better with her diet and has lost a little weight Blood sugars are reportedly below 110 at home and 109 in the lab fasting She will continue the same regimen  History of hyperlipidemia: She will have labs done with her PCP at upcoming visit  History of mild hypercalcemia (now stable with upper normal levels  Hypertension: Well-controlled  Elayne Snare 06/22/2021, 10:29 AM   Note: This office note was prepared with Dragon voice recognition system technology. Any transcriptional errors that result from this process are unintentional.

## 2021-07-06 ENCOUNTER — Other Ambulatory Visit: Payer: Self-pay | Admitting: Family Medicine

## 2021-07-06 DIAGNOSIS — Z1231 Encounter for screening mammogram for malignant neoplasm of breast: Secondary | ICD-10-CM

## 2021-07-08 ENCOUNTER — Other Ambulatory Visit: Payer: Self-pay | Admitting: Endocrinology

## 2021-07-08 DIAGNOSIS — E1165 Type 2 diabetes mellitus with hyperglycemia: Secondary | ICD-10-CM

## 2021-08-07 ENCOUNTER — Other Ambulatory Visit: Payer: Self-pay | Admitting: Family Medicine

## 2021-08-18 ENCOUNTER — Ambulatory Visit
Admission: RE | Admit: 2021-08-18 | Discharge: 2021-08-18 | Disposition: A | Discharge status: Home / Self Care | Payer: Medicare HMO | Source: Ambulatory Visit | Attending: Family Medicine | Admitting: Family Medicine

## 2021-08-18 DIAGNOSIS — Z1231 Encounter for screening mammogram for malignant neoplasm of breast: Secondary | ICD-10-CM | POA: Diagnosis not present

## 2021-08-23 ENCOUNTER — Other Ambulatory Visit: Payer: Self-pay | Admitting: Family Medicine

## 2021-08-23 DIAGNOSIS — R928 Other abnormal and inconclusive findings on diagnostic imaging of breast: Secondary | ICD-10-CM

## 2021-08-30 DIAGNOSIS — I10 Essential (primary) hypertension: Secondary | ICD-10-CM | POA: Diagnosis not present

## 2021-08-30 DIAGNOSIS — E042 Nontoxic multinodular goiter: Secondary | ICD-10-CM | POA: Diagnosis not present

## 2021-08-30 DIAGNOSIS — Z7984 Long term (current) use of oral hypoglycemic drugs: Secondary | ICD-10-CM | POA: Diagnosis not present

## 2021-08-30 DIAGNOSIS — E118 Type 2 diabetes mellitus with unspecified complications: Secondary | ICD-10-CM | POA: Diagnosis not present

## 2021-08-30 DIAGNOSIS — Z1389 Encounter for screening for other disorder: Secondary | ICD-10-CM | POA: Diagnosis not present

## 2021-08-30 DIAGNOSIS — I700 Atherosclerosis of aorta: Secondary | ICD-10-CM | POA: Diagnosis not present

## 2021-08-30 DIAGNOSIS — E785 Hyperlipidemia, unspecified: Secondary | ICD-10-CM | POA: Diagnosis not present

## 2021-08-30 DIAGNOSIS — Z23 Encounter for immunization: Secondary | ICD-10-CM | POA: Diagnosis not present

## 2021-08-30 DIAGNOSIS — D6869 Other thrombophilia: Secondary | ICD-10-CM | POA: Diagnosis not present

## 2021-08-30 DIAGNOSIS — Z0000 Encounter for general adult medical examination without abnormal findings: Secondary | ICD-10-CM | POA: Diagnosis not present

## 2021-09-01 ENCOUNTER — Other Ambulatory Visit: Payer: Self-pay | Admitting: Endocrinology

## 2021-09-01 DIAGNOSIS — E119 Type 2 diabetes mellitus without complications: Secondary | ICD-10-CM

## 2021-09-11 ENCOUNTER — Other Ambulatory Visit (HOSPITAL_COMMUNITY): Payer: Self-pay

## 2021-09-11 ENCOUNTER — Telehealth: Payer: Self-pay | Admitting: Pharmacy Technician

## 2021-09-11 NOTE — Telephone Encounter (Signed)
Patient Advocate Encounter  Received notification from [facility]) that prior authorization for ONE TOUCH ULTRA STRIPS is required.  Preferred meters and test strips include: Roche (e.g., Accu-Check Aviva Plus, Accu-Check Guide, Accu-Chek Guide Me) or [employer] (e.g. TrueMetrix, TrueTrack).   PA submitted on 12.5.22 Key [id number] Status is pending   [facility] will continue to follow  Ricke Hey, CPhT Patient Advocate [facility] Phone: [phone] Fax:  [phone]

## 2021-09-12 NOTE — Telephone Encounter (Signed)
Received notification from [employer] [facility]) regarding a prior authorization for ONE TOUCH ULTRA. Authorization has been APPROVED from 01.01.22 to 12.31.23.    Authorization #  [id number]

## 2021-10-26 ENCOUNTER — Ambulatory Visit
Admission: RE | Admit: 2021-10-26 | Discharge: 2021-10-26 | Disposition: A | Discharge status: Home / Self Care | Payer: Medicare HMO | Source: Ambulatory Visit | Attending: Family Medicine | Admitting: Family Medicine

## 2021-10-26 ENCOUNTER — Other Ambulatory Visit: Payer: Self-pay

## 2021-10-26 DIAGNOSIS — R922 Inconclusive mammogram: Secondary | ICD-10-CM | POA: Diagnosis not present

## 2021-10-26 DIAGNOSIS — R928 Other abnormal and inconclusive findings on diagnostic imaging of breast: Secondary | ICD-10-CM

## 2021-10-26 DIAGNOSIS — N6323 Unspecified lump in the left breast, lower outer quadrant: Secondary | ICD-10-CM | POA: Diagnosis not present

## 2021-12-19 ENCOUNTER — Other Ambulatory Visit (INDEPENDENT_AMBULATORY_CARE_PROVIDER_SITE_OTHER): Payer: Medicare HMO

## 2021-12-19 ENCOUNTER — Other Ambulatory Visit: Payer: Self-pay

## 2021-12-19 DIAGNOSIS — E119 Type 2 diabetes mellitus without complications: Secondary | ICD-10-CM | POA: Diagnosis not present

## 2021-12-19 DIAGNOSIS — E7800 Pure hypercholesterolemia, unspecified: Secondary | ICD-10-CM

## 2021-12-19 LAB — COMPREHENSIVE METABOLIC PANEL
ALT: 7 U/L (ref 0–35)
AST: 10 U/L (ref 0–37)
Albumin: 4 g/dL (ref 3.5–5.2)
Alkaline Phosphatase: 65 U/L (ref 39–117)
BUN: 20 mg/dL (ref 6–23)
CO2: 31 mEq/L (ref 19–32)
Calcium: 10.2 mg/dL (ref 8.4–10.5)
Chloride: 98 mEq/L (ref 96–112)
Creatinine, Ser: 0.72 mg/dL (ref 0.40–1.20)
GFR: 78.63 mL/min (ref >60.00)
Glucose, Bld: 96 mg/dL (ref 70–99)
Potassium: 3.9 mEq/L (ref 3.5–5.1)
Sodium: 137 mEq/L (ref 135–145)
Total Bilirubin: 0.4 mg/dL (ref 0.2–1.2)
Total Protein: 7.5 g/dL (ref 6.0–8.3)

## 2021-12-19 LAB — LIPID PANEL
Cholesterol: 143 mg/dL (ref 0–200)
HDL: 44 mg/dL (ref >39.00)
LDL Cholesterol: 83 mg/dL (ref 0–99)
NonHDL: 98.76
Total CHOL/HDL Ratio: 3
Triglycerides: 81 mg/dL (ref 0.0–149.0)
VLDL: 16.2 mg/dL (ref 0.0–40.0)

## 2021-12-19 LAB — MICROALBUMIN / CREATININE URINE RATIO
Creatinine,U: 112.3 mg/dL
Microalb Creat Ratio: 2.1 mg/g (ref 0.0–30.0)
Microalb, Ur: 2.4 mg/dL — ABNORMAL HIGH (ref 0.0–1.9)

## 2021-12-19 LAB — HEMOGLOBIN A1C: Hgb A1c MFr Bld: 7 % — ABNORMAL HIGH (ref 4.6–6.5)

## 2021-12-21 ENCOUNTER — Other Ambulatory Visit: Payer: Self-pay

## 2021-12-21 ENCOUNTER — Encounter: Payer: Self-pay | Admitting: Endocrinology

## 2021-12-21 ENCOUNTER — Ambulatory Visit (INDEPENDENT_AMBULATORY_CARE_PROVIDER_SITE_OTHER): Payer: Medicare HMO | Admitting: Endocrinology

## 2021-12-21 VITALS — BP 132/70 | HR 78 | Ht 62.0 in | Wt 189.2 lb

## 2021-12-21 DIAGNOSIS — E1165 Type 2 diabetes mellitus with hyperglycemia: Secondary | ICD-10-CM

## 2021-12-21 DIAGNOSIS — E7800 Pure hypercholesterolemia, unspecified: Secondary | ICD-10-CM

## 2021-12-21 DIAGNOSIS — I10 Essential (primary) hypertension: Secondary | ICD-10-CM | POA: Diagnosis not present

## 2021-12-21 NOTE — Progress Notes (Signed)
? ? ?Patient ID: Janet Williamson, female   DOB: Jul 01, 1941, 81 y.o.   MRN: [id number] ? ? ?Reason for Appointment: follow-up  ? ?History of Present Illness  ? ?Type 2 DIABETES MELITUS, date of diagnosis 02/2012 ?    ?She has had mild diabetes which has been well controlled with metformin ER alone. ?Has had no side effects from metformin  ?She also has had diabetes education in 2013 ?Marland Kitchen ?Oral hypoglycemic drugs: Metformin ER using 750 mg tablets, 1.5 g daily.       Side effects from medications: None ? ?Her A1c is about the same at 7 compared to 6.9 ? ?Fructosamine last 250 ? ?Current management, blood sugar patterns and problems identified: ? ? ?She has been taking metformin long-term without side effects ?Compared to last year her weight is coming down slightly but recently stable ?She says that she is generally eating small portions without any large meals either at lunch or dinner ?However not clear what her blood sugars are after meals and she is mostly checking blood sugar before or after breakfast ?Blood sugars are slightly higher fasting but better after breakfast ?Previously had benefited from discussing meal planning with the dietitian ?Not able to do any exercise because of musculoskeletal issues ? ? ?Monitors blood glucose:  less than once a day     Glucometer:  One Touch ultra 2 ? ?Blood sugar readings from download: ? ?PRE-MEAL Fasting Lunch Dinner Bedtime Overall  ?Glucose range: 120-147      ?Mean/median: 135    133  ? ?Previously: ? ?POST-MEAL PC Breakfast PC Lunch PC Dinner  ?Glucose range: 111-155    ?Mean/median:     ? ? ? ?Previous visits with dietitian: 4/22 ?   ? ?Wt Readings from Last 3 Encounters:  ?12/21/21 189 lb 3.2 oz (85.8 kg)  ?06/22/21 190 lb 12.8 oz (86.5 kg)  ?04/21/21 195 lb 9.6 oz (88.7 kg)  ? ? ? ?Lab Results  ?Component Value Date  ? HGBA1C 7.0 (H) 12/19/2021  ? HGBA1C 6.9 (H) 06/20/2021  ? HGBA1C 6.9 (H) 12/07/2020  ? ?Lab Results  ?Component Value Date  ? MICROALBUR 2.4 (H)  12/19/2021  ? [place] 83 12/19/2021  ? CREATININE 0.72 12/19/2021  ? ?  ?Other active problems evaluated today: See review of systems ? ?LABS: ? ?Lab on 12/19/2021  ?Component Date Value Ref Range Status  ? Cholesterol 12/19/2021 143  0 - 200 mg/dL Final  ? ATP III Classification       Desirable:  < 200 mg/dL               Borderline High:  200 - 239 mg/dL          High:  > = 240 mg/dL  ? Triglycerides 12/19/2021 81.0  0.0 - 149.0 mg/dL Final  ? Normal:  <150 mg/dLBorderline High:  150 - 199 mg/dL  ? HDL 12/19/2021 44.00  >39.00 mg/dL Final  ? VLDL 12/19/2021 16.2  0.0 - 40.0 mg/dL Final  ? LDL Cholesterol 12/19/2021 83  0 - 99 mg/dL Final  ? Total CHOL/HDL Ratio 12/19/2021 3   Final  ?                Men          Women1/2 Average Risk     3.4          3.3Average Risk          5.0  4.42X Average Risk          9.6          7.13X Average Risk          15.0          11.0                      ? NonHDL 12/19/2021 98.76   Final  ? NOTE:  Non-HDL goal should be 30 mg/dL higher than patient's LDL goal (i.e. LDL goal of < 70 mg/dL, would have non-HDL goal of < 100 mg/dL)  ? Microalb, Ur 12/19/2021 2.4 (H)  0.0 - 1.9 mg/dL Final  ? Creatinine,U 12/19/2021 112.3  mg/dL Final  ? Microalb Creat Ratio 12/19/2021 2.1  0.0 - 30.0 mg/g Final  ? Sodium 12/19/2021 137  135 - 145 mEq/L Final  ? Potassium 12/19/2021 3.9  3.5 - 5.1 mEq/L Final  ? Chloride 12/19/2021 98  96 - 112 mEq/L Final  ? CO2 12/19/2021 31  19 - 32 mEq/L Final  ? Glucose, Bld 12/19/2021 96  70 - 99 mg/dL Final  ? BUN 12/19/2021 20  6 - 23 mg/dL Final  ? Creatinine, Ser 12/19/2021 0.72  0.40 - 1.20 mg/dL Final  ? Total Bilirubin 12/19/2021 0.4  0.2 - 1.2 mg/dL Final  ? Alkaline Phosphatase 12/19/2021 65  39 - 117 U/L Final  ? AST 12/19/2021 10  0 - 37 U/L Final  ? ALT 12/19/2021 7  0 - 35 U/L Final  ? Total Protein 12/19/2021 7.5  6.0 - 8.3 g/dL Final  ? Albumin 12/19/2021 4.0  3.5 - 5.2 g/dL Final  ? GFR 12/19/2021 78.63  >60.00 mL/min Final  ? Calculated  using the CKD-EPI Creatinine Equation (2021)  ? Calcium 12/19/2021 10.2  8.4 - 10.5 mg/dL Final  ? Hgb A1c MFr Bld 12/19/2021 7.0 (H)  4.6 - 6.5 % Final  ? Glycemic Control Guidelines for People with Diabetes:Non Diabetic:  <6%Goal of Therapy: <7%Additional Action Suggested:  >8%   ? ? ?Allergies as of 12/21/2021   ? ?   Reactions  ? Ace Inhibitors Swelling, Other (See Comments)  ? Angioedema  ? Keflex [cephalexin] Swelling, Other (See Comments)  ? Lips became swollwn  ? Rifadin [rifampin] Swelling, Other (See Comments)  ? Lips became swollen  ? ?  ? ?  ?Medication List  ?  ? ?  ? Accurate as of December 21, 2021 10:36 AM. If you have any questions, ask your nurse or doctor.  ?  ?  ? ?  ? ?acetaminophen 325 MG tablet ?Commonly known as: TYLENOL ?Take 325-650 mg by mouth every 8 (eight) hours as needed (for headaches). ?  ?amLODipine 5 MG tablet ?Commonly known as: NORVASC ?Take 5 mg by mouth daily. ?  ?diclofenac 1.3 % Ptch ?Commonly known as: FLECTOR ?Place 1 patch onto the skin 2 (two) times daily. ?  ?doxycycline 100 MG capsule ?Commonly known as: VIBRAMYCIN ?Take 100 mg by mouth 2 (two) times daily. ?  ?hydrochlorothiazide 25 MG tablet ?Commonly known as: HYDRODIURIL ?Take 25 mg by mouth daily. ?  ?hydrOXYzine 25 MG tablet ?Commonly known as: ATARAX ?Take 25 mg by mouth 3 (three) times daily as needed. ?  ?metFORMIN 750 MG 24 hr tablet ?Commonly known as: GLUCOPHAGE-XR ?TAKE 2 TABLETS BY MOUTH ONCE DAILY WITH BREAKFAST ?  ?metoprolol succinate 50 MG 24 hr tablet ?Commonly known as: TOPROL-XL ?Take 50 mg by mouth daily. ?  ?Accu-Chek  Guide Me w/Device Kit ?by Does not apply route. ?  ?ONE TOUCH ULTRA 2 w/Device Kit ?Use to check blood sugars three times daily DX: E11.9 ?  ?OneTouch Delica Plus [id number] Misc ?TEST BLOOD SUGAR THREE TIMES DAILY ?  ?OneTouch Ultra test strip ?Generic drug: glucose blood ?TEST BLOOD SUGAR THREE TIMES DAILY ?  ?simvastatin 20 MG tablet ?Commonly known as: ZOCOR ?Take 20 mg by mouth every  evening. ?  ?SYSTANE OP ?Place 1 drop into both eyes 2 (two) times daily. ?  ?Vitamin D3 125 MCG (5000 UT) Caps ?Take 5,000 Units by mouth daily. ?  ? ?  ? ? ?Allergies:  ?Allergies  ?Allergen Reactions  ? Ace Inhibitors Swelling and Other (See Comments)  ?  Angioedema ?  ? Keflex [Cephalexin] Swelling and Other (See Comments)  ?  Lips became swollwn  ? Rifadin [Rifampin] Swelling and Other (See Comments)  ?  Lips became swollen  ? ? ?Past Medical History:  ?Diagnosis Date  ? Acute lower GI bleeding 05/2019  ? Anemia   ? Cardiac mass   ? a. 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 Not seen on most recent TEE 2019.  ? Cardiomyopathy in other disease   ? Cataracts, bilateral   ? Diabetes mellitus   ? Type 2  ? Generalized osteoarthritis   ? GERD (gastroesophageal reflux disease)   ? HLD (hyperlipidemia)   ? Hypertension   ? Hypothyroidism   ? LBBB (left bundle branch block)   ? PAF (paroxysmal atrial fibrillation) ([place])   ? a. s/p DCCV 06/2017, on Xarelto  ? Phlebitis   ? S/P TAVR (transcatheter aortic valve replacement) 04/15/2018  ? Edwards Sapien 3 THV (size 23 mm, model # 9600TFX, serial # [id number]) via the TF approach  ? Severe aortic stenosis   ? a. s/p TAVR 04/2018.  ? Stroke [facility]) 2008  ? ? ?Past Surgical History:  ?Procedure Laterality Date  ? ABDOMINAL HYSTERECTOMY    ? BIOPSY  05/19/2019  ? Procedure: BIOPSY;  Surgeon: Ronald Lobo, MD;  Location: [place];  Service: Endoscopy;;  ? BREAST BIOPSY Left   ? years ago at "a doctor's office"  ? COLONOSCOPY    ? COLONOSCOPY WITH PROPOFOL N/A 10/02/2018  ? Procedure: COLONOSCOPY WITH PROPOFOL;  Surgeon: Arta Silence, MD;  Location: [place];  Service: Endoscopy;  Laterality: N/A;  ? ENTEROSCOPY N/A 05/19/2019  ? Procedure: ENTEROSCOPY;  Surgeon: Ronald Lobo, MD;  Location: [place];  Service: Endoscopy;  Laterality: N/A;  ? ESOPHAGOGASTRODUODENOSCOPY (EGD) WITH PROPOFOL N/A 10/02/2018  ? Procedure: ESOPHAGOGASTRODUODENOSCOPY (EGD) WITH PROPOFOL;   Surgeon: Arta Silence, MD;  Location: [facility] ENDOSCOPY;  Service: Endoscopy;  Laterality: N/A;  ? ESOPHAGOGASTRODUODENOSCOPY (EGD) WITH PROPOFOL N/A 04/23/2019  ? Procedure: ESOPHAGOGASTRODUODENOSCOPY (

## 2021-12-21 NOTE — Patient Instructions (Addendum)
Check blood sugars on waking up 2 days a week  Also check blood sugars about 2 hours after meals and do this after different meals by rotation  Recommended blood sugar levels on waking up are 90-130 and about 2 hours after meal is 130-160  Please bring your blood sugar monitor to each visit, thank you   

## 2022-01-24 ENCOUNTER — Other Ambulatory Visit: Payer: Self-pay | Admitting: Endocrinology

## 2022-03-04 DIAGNOSIS — E119 Type 2 diabetes mellitus without complications: Secondary | ICD-10-CM | POA: Diagnosis not present

## 2022-03-14 ENCOUNTER — Other Ambulatory Visit: Payer: Self-pay | Admitting: Endocrinology

## 2022-03-14 DIAGNOSIS — E1165 Type 2 diabetes mellitus with hyperglycemia: Secondary | ICD-10-CM

## 2022-05-14 ENCOUNTER — Ambulatory Visit (INDEPENDENT_AMBULATORY_CARE_PROVIDER_SITE_OTHER): Payer: Medicare HMO | Admitting: Orthopedic Surgery

## 2022-05-14 ENCOUNTER — Ambulatory Visit (INDEPENDENT_AMBULATORY_CARE_PROVIDER_SITE_OTHER): Payer: Medicare HMO

## 2022-05-14 ENCOUNTER — Ambulatory Visit: Payer: Self-pay

## 2022-05-14 ENCOUNTER — Encounter: Payer: Self-pay | Admitting: Orthopedic Surgery

## 2022-05-14 DIAGNOSIS — M25561 Pain in right knee: Secondary | ICD-10-CM

## 2022-05-14 DIAGNOSIS — M1712 Unilateral primary osteoarthritis, left knee: Secondary | ICD-10-CM

## 2022-05-14 DIAGNOSIS — M79605 Pain in left leg: Secondary | ICD-10-CM

## 2022-05-14 DIAGNOSIS — M79604 Pain in right leg: Secondary | ICD-10-CM

## 2022-05-14 DIAGNOSIS — M25562 Pain in left knee: Secondary | ICD-10-CM

## 2022-05-14 MED ORDER — METHYLPREDNISOLONE ACETATE 40 MG/ML IJ SUSP
40.0000 mg | INTRAMUSCULAR | Status: AC | PRN
  Administered 2022-05-14: 40 mg via INTRA_ARTICULAR

## 2022-05-14 MED ORDER — BUPIVACAINE HCL 0.25 % IJ SOLN
4.0000 mL | INTRAMUSCULAR | Status: AC | PRN
  Administered 2022-05-14: 4 mL via INTRA_ARTICULAR

## 2022-05-14 MED ORDER — LIDOCAINE HCL 1 % IJ SOLN
5.0000 mL | INTRAMUSCULAR | Status: AC | PRN
  Administered 2022-05-14: 5 mL

## 2022-05-14 NOTE — Progress Notes (Signed)
Office Visit Note   Patient: Janet Williamson           Date of Birth: 06/20/1941           MRN: [id number] Visit Date: 05/14/2022 Requested by: Maurice Small, MD [address] [place],  [place] [address] PCP: Maurice Small, MD  Subjective: Chief Complaint  Patient presents with   Right Knee - Pain   Left Knee - Pain    HPI: Janet Williamson is a an 81 year old patient with bilateral knee pain.  She underwent right total knee replacement in 2015.  Reports generally constant pain in her knees which radiate down her tibias.  Denies any history of injury.  Last 3 months the pain has become more constant.  She reports swelling when she is sitting down.  Pain moves from her knee into her lower legs.  Takes Tylenol for pain as needed.  She is having some low back pain as well.  Radiographs from 2020 one of the lumbar spine looked generally intact with mild degenerative changes.  Pain is better when she stops walking.  Hurts her to sleep at times.  Denies any fevers and chills.              ROS: All systems reviewed are negative as they relate to the chief complaint within the history of present illness.  Patient denies  fevers or chills.   Assessment & Plan: Visit Diagnoses:  1. Pain in both knees, unspecified chronicity   2. Bilateral leg pain     Plan: Impression is bilateral knee pain with severe arthritis in the left knee.  Right knee looks good in terms of alignment no effusion and good range of motion.  I think this could be coming from her back.  Could have stenosis in her back.  Symptoms been going on for 3 months minimum.  Failure of conservative treatment.  Plan is cortisone injection into the left knee with MRI of the lumbar spine to evaluate stenosis follow-up after that study  Follow-Up Instructions: Return for after MRI.   Orders:  Orders Placed This Encounter  Procedures   XR KNEE 3 VIEW RIGHT   XR KNEE 3 VIEW LEFT   MR Lumbar Spine w/o contrast   No orders of  the defined types were placed in this encounter.     Procedures: Large Joint Inj: L knee on 05/14/2022 8:17 PM Indications: diagnostic evaluation, joint swelling and pain Details: 18 G 1.5 in needle, superolateral approach  Arthrogram: No  Medications: 5 mL lidocaine 1 %; 40 mg methylPREDNISolone acetate 40 MG/ML; 4 mL bupivacaine 0.25 % Outcome: tolerated well, no immediate complications Procedure, treatment alternatives, risks and benefits explained, specific risks discussed. Consent was given by the patient. Immediately prior to procedure a time out was called to verify the correct patient, procedure, equipment, support staff and site/side marked as required. Patient was prepped and draped in the usual sterile fashion.       Clinical Data: No additional findings.  Objective: Vital Signs: There were no vitals taken for this visit.  Physical Exam:   Constitutional: Patient appears well-developed HEENT:  Head: Normocephalic Eyes:EOM are normal Neck: Normal range of motion Cardiovascular: Normal rate Pulmonary/chest: Effort normal Neurologic: Patient is alert Skin: Skin is warm Psychiatric: Patient has normal mood and affect   Ortho Exam: Ortho exam demonstrates full active and passive range of motion of the hips.  Right knee has range of motion of 0-100 left knee range  of motion 10-80 with mild effusion.  No groin pain on the left right-hand side with internal and external rotation.  Pedal pulses palpable.  No other masses lymphadenopathy or skin changes noted in the knee regions.  No warmth to either knee.  No nerve root tension signs.  5 out of 5 ankle dorsiflexion plantarflexion quad and hamstring strength with no paresthesias L1-S1 bilaterally.  Specialty Comments:  No specialty comments available.  Imaging: XR KNEE 3 VIEW LEFT  Result Date: 05/14/2022 AP lateral merchant radiographs left knee reviewed.  Moderate varus alignment is present.  End-stage medial  compartment arthritis is present with less severe arthritis in the patellofemoral and lateral components.  No acute fracture.  XR KNEE 3 VIEW RIGHT  Result Date: 05/14/2022 AP lateral merchant radiographs right knee reviewed.  Total knee prosthesis in good position alignment with no complicating features.  No loosening or lucencies around the bone cement interface.  No acute fracture.  Patella well centered in the trochlear groove    PMFS History: Patient Active Problem List   Diagnosis Date Noted   GI bleed 04/21/2019   Symptomatic anemia 09/30/2018   S/P TAVR (transcatheter aortic valve replacement) 04/15/2018   Severe aortic stenosis    PAF (paroxysmal atrial fibrillation) ([facility]) 03/04/2018   History of stroke 11/03/2014   Hypertension    DM (diabetes mellitus) ([facility]) 05/06/2013   HTN (hypertension) 05/06/2013   Anemia 05/06/2013   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 Not seen on most recent TEE 2019.   Cardiomyopathy in other disease    Cataracts, bilateral    Diabetes mellitus    Type 2   Generalized osteoarthritis    GERD (gastroesophageal reflux disease)    HLD (hyperlipidemia)    Hypertension    Hypothyroidism    LBBB (left bundle branch block)    PAF (paroxysmal atrial fibrillation) ([facility])    a. s/p DCCV 06/2017, on Xarelto   Phlebitis    S/P TAVR (transcatheter aortic valve replacement) 04/15/2018   Edwards Sapien 3 THV (size 23 mm, model # 9600TFX, serial # [id number]) via the TF approach   Severe aortic stenosis    a. s/p TAVR 04/2018.   Stroke [facility]) 2008    Family History  Problem Relation Age of Onset   Stroke Mother    Hypertension Mother    Hypertension Father    Heart attack Neg Hx     Past Surgical History:  Procedure Laterality Date   ABDOMINAL HYSTERECTOMY     BIOPSY  05/19/2019   Procedure: BIOPSY;  Surgeon: Bernette Redbird, MD;  Location: [facility]  ENDOSCOPY;  Service: Endoscopy;;   BREAST BIOPSY Left    years ago at "a doctor's office"   COLONOSCOPY     COLONOSCOPY WITH PROPOFOL N/A 10/02/2018   Procedure: COLONOSCOPY WITH PROPOFOL;  Surgeon: [name], MD;  Location: [facility] ENDOSCOPY;  Service: Endoscopy;  Laterality: N/A;   ENTEROSCOPY N/A 05/19/2019   Procedure: ENTEROSCOPY;  Surgeon: Bernette Redbird, MD;  Location: [facility] ENDOSCOPY;  Service: Endoscopy;  Laterality: N/A;   ESOPHAGOGASTRODUODENOSCOPY (EGD) WITH PROPOFOL N/A 10/02/2018   Procedure: ESOPHAGOGASTRODUODENOSCOPY (EGD) WITH PROPOFOL;  Surgeon: [name], MD;  Location: [facility] ENDOSCOPY;  Service: Endoscopy;  Laterality: N/A;   ESOPHAGOGASTRODUODENOSCOPY (EGD) WITH PROPOFOL N/A 04/23/2019   Procedure: ESOPHAGOGASTRODUODENOSCOPY (EGD) WITH PROPOFOL;  Surgeon: Graylin Shiver, MD;  Location: [facility] ENDOSCOPY;  Service: Endoscopy;  Laterality: N/A;   EYE SURGERY Bilateral    cataract removal   GIVENS CAPSULE STUDY N/A 10/02/2018   Procedure: GIVENS CAPSULE STUDY;  Surgeon: Arta Silence, MD;  [facility] END[facility];  Service: Endoscopy;  Laterality: N/A;   RIGHT/LEFT HEART CATH AND CORONARY ANGIOGRAPHY N/A 03/06/2018   Procedure: RIGHT/LEFT HEART CATH AND CORONARY ANGIOGRAPHY;  Surgeon: Belva Crome, MD;  Location: [place] CV LAB;  Service: Cardiovascular;  Laterality: N/A;   SMALL BOWEL ENTEROSCOPY  05/19/2019   TEE WITHOUT CARDIOVERSION N/A 04/01/2018   Procedure: TRANSESOPHAGEAL ECHOCARDIOGRAM (TEE);  Surgeon: Jerline Pain, MD;  Location: [facility] ENDOSCOPY;  Service: Cardiovascular;  Laterality: N/A;   TEE WITHOUT CARDIOVERSION N/A 04/15/2018   Procedure: TRANSESOPHAGEAL ECHOCARDIOGRAM (TEE);  Surgeon: Sherren Mocha, MD;  Location: [place];  Service: Open Heart Surgery;  Laterality: N/A;   TOTAL KNEE ARTHROPLASTY Right 04/14/2013   Dr Marlou Sa   TOTAL KNEE ARTHROPLASTY Right 04/14/2013   Procedure: TOTAL KNEE ARTHROPLASTY;  Surgeon: Meredith Pel, MD;  Location: [place];   Service: Orthopedics;  Laterality: Right;   TRANSCATHETER AORTIC VALVE REPLACEMENT, TRANSFEMORAL  04/15/2018   TRANSCATHETER AORTIC VALVE REPLACEMENT, TRANSFEMORAL Bilateral 04/15/2018   Procedure: TRANSCATHETER AORTIC VALVE REPLACEMENT, TRANSFEMORAL;  Surgeon: Sherren Mocha, MD;  Location: [place];  Service: Open Heart Surgery;  Laterality: Bilateral;   Social History   Occupational History   Not on file  Tobacco Use   Smoking status: Never   Smokeless tobacco: Never  Vaping Use   Vaping Use: Never used  Substance and Sexual Activity   Alcohol use: No   Drug use: No   Sexual activity: Not Currently

## 2022-06-04 ENCOUNTER — Encounter: Payer: Self-pay | Admitting: Orthopedic Surgery

## 2022-06-04 ENCOUNTER — Ambulatory Visit (INDEPENDENT_AMBULATORY_CARE_PROVIDER_SITE_OTHER): Payer: Medicare HMO | Admitting: Surgical

## 2022-06-04 ENCOUNTER — Telehealth: Payer: Self-pay

## 2022-06-04 DIAGNOSIS — M1712 Unilateral primary osteoarthritis, left knee: Secondary | ICD-10-CM

## 2022-06-04 NOTE — Progress Notes (Signed)
Patient returns for patient returns for reevaluation of left knee pain and low back pain.  She was supposed to have MRI of her lumbar spine but states that she was never called.  MRI is authorized according to the referrals tab and good through September 9.  She was given the number for [facility] imaging to call and set up MRI lumbar spine.  She does state that the knee pain is improved.  Plan to preapproved her for left knee gel injection.  Mostly complains of axial lumbar spine pain without radicular pain.  No red flag symptoms.  She will be no charge for this visit today as she was intended to go over the MRI this visit.  Follow-up after MRI.

## 2022-06-04 NOTE — Telephone Encounter (Signed)
Can we get this patient approved for a  L knee gel injection please

## 2022-06-07 NOTE — Telephone Encounter (Signed)
VOB submitted for Monovisc, left knee 

## 2022-06-16 ENCOUNTER — Other Ambulatory Visit: Payer: Medicare HMO

## 2022-06-21 ENCOUNTER — Other Ambulatory Visit: Payer: Self-pay | Admitting: Endocrinology

## 2022-06-21 DIAGNOSIS — E119 Type 2 diabetes mellitus without complications: Secondary | ICD-10-CM

## 2022-06-26 ENCOUNTER — Other Ambulatory Visit: Payer: Medicare HMO

## 2022-06-27 ENCOUNTER — Ambulatory Visit: Payer: Medicare HMO | Admitting: Orthopedic Surgery

## 2022-06-28 ENCOUNTER — Ambulatory Visit: Payer: Medicare HMO | Admitting: Endocrinology

## 2022-06-28 ENCOUNTER — Ambulatory Visit
Admission: RE | Admit: 2022-06-28 | Discharge: 2022-06-28 | Disposition: A | Discharge status: Home / Self Care | Payer: Medicare HMO | Source: Ambulatory Visit | Attending: Orthopedic Surgery | Admitting: Orthopedic Surgery

## 2022-06-28 DIAGNOSIS — M79604 Pain in right leg: Secondary | ICD-10-CM

## 2022-06-28 DIAGNOSIS — M48061 Spinal stenosis, lumbar region without neurogenic claudication: Secondary | ICD-10-CM | POA: Diagnosis not present

## 2022-06-28 DIAGNOSIS — M545 Low back pain, unspecified: Secondary | ICD-10-CM | POA: Diagnosis not present

## 2022-06-29 ENCOUNTER — Other Ambulatory Visit: Payer: Self-pay

## 2022-06-29 DIAGNOSIS — M1712 Unilateral primary osteoarthritis, left knee: Secondary | ICD-10-CM

## 2022-07-04 ENCOUNTER — Ambulatory Visit (INDEPENDENT_AMBULATORY_CARE_PROVIDER_SITE_OTHER): Payer: Medicare HMO | Admitting: Surgical

## 2022-07-04 ENCOUNTER — Encounter: Payer: Self-pay | Admitting: Orthopedic Surgery

## 2022-07-04 DIAGNOSIS — M79604 Pain in right leg: Secondary | ICD-10-CM | POA: Diagnosis not present

## 2022-07-04 DIAGNOSIS — M48061 Spinal stenosis, lumbar region without neurogenic claudication: Secondary | ICD-10-CM

## 2022-07-04 DIAGNOSIS — M1712 Unilateral primary osteoarthritis, left knee: Secondary | ICD-10-CM | POA: Diagnosis not present

## 2022-07-04 DIAGNOSIS — M79605 Pain in left leg: Secondary | ICD-10-CM | POA: Diagnosis not present

## 2022-07-04 MED ORDER — LIDOCAINE HCL 1 % IJ SOLN
5.0000 mL | INTRAMUSCULAR | Status: AC | PRN
  Administered 2022-07-04: 5 mL

## 2022-07-04 MED ORDER — HYALURONAN 88 MG/4ML IX SOSY
88.0000 mg | PREFILLED_SYRINGE | INTRA_ARTICULAR | Status: AC | PRN
  Administered 2022-07-04: 88 mg via INTRA_ARTICULAR

## 2022-07-04 NOTE — Progress Notes (Signed)
Need f/u fri pm thx

## 2022-07-04 NOTE — Progress Notes (Signed)
Office Visit Note   Patient: [place]           Date of Birth: 07/15/1941           MRN: [id number] Visit Date: 07/04/2022 Requested by: Kelton Pillar, MD [facility] [address] [employer] [address],  [place] [address] PCP: Kelton Pillar, MD  Subjective: Chief Complaint  Patient presents with   Other     Scan review Knee monovisc injection    HPI: Janet [place] is a 81 y.o. female who presents to the office for MRI review. Patient denies any changes in symptoms.  Continues to complain mainly of axial lumbar spine pain as well as radiation of pain down the left leg to the knee/proximal calf.  Also returns for left knee Monovisc injection.  No red flag symptoms.  MRI results revealed: MR Lumbar Spine w/o contrast  Result Date: 07/02/2022 CLINICAL DATA:  Spinal stenosis. Patient reports low back pain extending into the lower extremities bilaterally EXAM: MRI LUMBAR SPINE WITHOUT CONTRAST TECHNIQUE: Multiplanar, multisequence MR imaging of the lumbar spine was performed. No intravenous contrast was administered. COMPARISON:  Lumbar spine radiographs 12/26/2019 FINDINGS: Segmentation: 5 non rib-bearing lumbar type vertebral bodies are present. The lowest fully formed vertebral body is L5. Alignment: No significant listhesis is present. Mild leftward curvature is centered at L3-4. Vertebrae:  Marrow signal and vertebral body heights are normal. Conus medullaris and cauda equina: Conus extends to the L1 level. Conus and cauda equina appear normal. Paraspinal and other soft tissues: Limited imaging the abdomen is unremarkable. There is no significant adenopathy. No solid organ lesions are present. Disc levels: T12-L1: Negative. L1-2: Negative. L2-3: Negative. L3-4: A leftward disc protrusion is present. Mild left subarticular narrowing is present. Foramina are patent bilaterally. L4-5: A broad-based disc protrusion is present. Moderate facet hypertrophy and ligamentum flavum thickening is  noted. Mild central canal stenosis is present with subarticular narrowing bilaterally. Mild to moderate foraminal narrowing is worse right than left. L5-S1: A broad-based disc protrusion is present. Moderate facet hypertrophy and ligamentum flavum thickening is noted. Mild subarticular and foraminal narrowing is present bilaterally. IMPRESSION: 1. Broad-based disc protrusions and facet hypertrophy at L4-5 greater than L5-S1. 2. Mild central canal stenosis with subarticular narrowing bilaterally at L4-5. 3. Mild to moderate foraminal narrowing bilaterally at L4-5 is worse right than left. 4. Mild subarticular and foraminal narrowing bilaterally at L5-S1. 5. Mild left subarticular narrowing at L3-4. Electronically Signed   By: San Morelle M.D.   On: 07/02/2022 05:49                 ROS: All systems reviewed are negative as they relate to the chief complaint within the history of present illness.  Patient denies fevers or chills.  Assessment & Plan: Visit Diagnoses:  1. Bilateral leg pain     Plan: Janet [facility] is a 81 y.o. female who presents to the office for left knee Monovisc injection.  She is also here for lumbar spine MRI review.  This was reviewed with her today and demonstrates some spinal stenosis at L4-L5 as well as foraminal stenosis bilaterally at this level which may be contributing to her low back pain and radicular pain.  Plan to set her up for lumbar spine ESI with Dr. Ernestina Patches.  She does not take any blood thinners.  Monovisc injection also administered today.  She tolerated procedure well.  Follow-up with the office as needed if symptoms do not improve.  Follow-Up Instructions:  No follow-ups on file.   Orders:  Orders Placed This Encounter  Procedures   Ambulatory referral to Physical Medicine Rehab   No orders of the defined types were placed in this encounter.     Procedures: Large Joint Inj on 07/04/2022 11:21 AM Indications: pain, joint swelling and diagnostic  evaluation Details: 18 G 1.5 in needle, superolateral approach  Arthrogram: No  Medications: 5 mL lidocaine 1 %; 88 mg Hyaluronan 88 MG/4ML Outcome: tolerated well, no immediate complications Procedure, treatment alternatives, risks and benefits explained, specific risks discussed. Consent was given by the patient. Immediately prior to procedure a time out was called to verify the correct patient, procedure, equipment, support staff and site/side marked as required. Patient was prepped and draped in the usual sterile fashion.       Clinical Data: No additional findings.  Objective: Vital Signs: There were no vitals taken for this visit.  Physical Exam:  Constitutional: Patient appears well-developed HEENT:  Head: Normocephalic Eyes:EOM are normal Neck: Normal range of motion Cardiovascular: Normal rate Pulmonary/chest: Effort normal Neurologic: Patient is alert Skin: Skin is warm Psychiatric: Patient has normal mood and affect  Ortho Exam: Ortho exam demonstrates left knee with small effusion.  Intact active and passive range of motion.  Able to perform straight leg raise.  Ambulates without significant antalgia.  5/5 motor strength of bilateral hip flexion, quadricep, hamstring, dorsiflexion, plantarflexion.  Specialty Comments:  No specialty comments available.  Imaging: No results found.   PMFS History: Patient Active Problem List   Diagnosis Date Noted   GI bleed 04/21/2019   Symptomatic anemia 09/30/2018   S/P TAVR (transcatheter aortic valve replacement) 04/15/2018   Severe aortic stenosis    PAF (paroxysmal atrial fibrillation) ([place]) 03/04/2018   History of stroke 11/03/2014   Hypertension    DM (diabetes mellitus) ([place]) 05/06/2013   HTN (hypertension) 05/06/2013   Anemia 05/06/2013   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 Not seen on most recent  TEE 2019.   Cardiomyopathy in other disease    Cataracts, bilateral    Diabetes mellitus    Type 2   Generalized osteoarthritis    GERD (gastroesophageal reflux disease)    HLD (hyperlipidemia)    Hypertension    Hypothyroidism    LBBB (left bundle branch block)    PAF (paroxysmal atrial fibrillation) ([place])    a. s/p DCCV 06/2017, on Xarelto   Phlebitis    S/P TAVR (transcatheter aortic valve replacement) 04/15/2018   Edwards Sapien 3 THV (size 23 mm, model # 9600TFX, serial # [id number]) via the TF approach   Severe aortic stenosis    a. s/p TAVR 04/2018.   Stroke [facility]) 2008    Family History  Problem Relation Age of Onset   Stroke Mother    Hypertension Mother    Hypertension Father    Heart attack Neg Hx     Past Surgical History:  Procedure Laterality Date   ABDOMINAL HYSTERECTOMY     BIOPSY  05/19/2019   Procedure: BIOPSY;  Surgeon: Ronald Lobo, MD;  Location: [place] ENDOSCOPY;  Service: Endoscopy;;   BREAST BIOPSY Left    years ago at "a doctor's office"   COLONOSCOPY     COLONOSCOPY WITH PROPOFOL N/A 10/02/2018   Procedure: COLONOSCOPY WITH PROPOFOL;  Surgeon: Arta Silence, MD;  Location: [place];  Service: Endoscopy;  Laterality:  N/A;   ENTEROSCOPY N/A 05/19/2019   Procedure: ENTEROSCOPY;  Surgeon: Ronald Lobo, MD;  Location: [place];  Service: Endoscopy;  Laterality: N/A;   ESOPHAGOGASTRODUODENOSCOPY (EGD) WITH PROPOFOL N/A 10/02/2018   Procedure: ESOPHAGOGASTRODUODENOSCOPY (EGD) WITH PROPOFOL;  Surgeon: Arta Silence, MD;  Location: [place];  Service: Endoscopy;  Laterality: N/A;   ESOPHAGOGASTRODUODENOSCOPY (EGD) WITH PROPOFOL N/A 04/23/2019   Procedure: ESOPHAGOGASTRODUODENOSCOPY (EGD) WITH PROPOFOL;  Surgeon: Wonda Horner, MD;  Location: [facility] ENDOSCOPY;  Service: Endoscopy;  Laterality: N/A;   EYE SURGERY Bilateral    cataract removal   GIVENS CAPSULE STUDY N/A 10/02/2018   Procedure: GIVENS CAPSULE STUDY;  Surgeon: Arta Silence, MD;   Location: [facility] ENDOSCOPY;  Service: Endoscopy;  Laterality: N/A;   RIGHT/LEFT HEART CATH AND CORONARY ANGIOGRAPHY N/A 03/06/2018   Procedure: RIGHT/LEFT HEART CATH AND CORONARY ANGIOGRAPHY;  Surgeon: Belva Crome, MD;  Location: [place] CV LAB;  Service: Cardiovascular;  Laterality: N/A;   SMALL BOWEL ENTEROSCOPY  05/19/2019   TEE WITHOUT CARDIOVERSION N/A 04/01/2018   Procedure: TRANSESOPHAGEAL ECHOCARDIOGRAM (TEE);  Surgeon: Jerline Pain, MD;  Location: [facility] ENDOSCOPY;  Service: Cardiovascular;  Laterality: N/A;   TEE WITHOUT CARDIOVERSION N/A 04/15/2018   Procedure: TRANSESOPHAGEAL ECHOCARDIOGRAM (TEE);  Surgeon: Sherren Mocha, MD;  Location: [place];  Service: Open Heart Surgery;  Laterality: N/A;   TOTAL KNEE ARTHROPLASTY Right 04/14/2013   Dr Marlou Sa   TOTAL KNEE ARTHROPLASTY Right 04/14/2013   Procedure: TOTAL KNEE ARTHROPLASTY;  Surgeon: Meredith Pel, MD;  Location: [place];  Service: Orthopedics;  Laterality: Right;   TRANSCATHETER AORTIC VALVE REPLACEMENT, TRANSFEMORAL  04/15/2018   TRANSCATHETER AORTIC VALVE REPLACEMENT, TRANSFEMORAL Bilateral 04/15/2018   Procedure: TRANSCATHETER AORTIC VALVE REPLACEMENT, TRANSFEMORAL;  Surgeon: Sherren Mocha, MD;  Location: [place];  Service: Open Heart Surgery;  Laterality: Bilateral;   Social History   Occupational History   Not on file  Tobacco Use   Smoking status: Never   Smokeless tobacco: Never  Vaping Use   Vaping Use: Never used  Substance and Sexual Activity   Alcohol use: No   Drug use: No   Sexual activity: Not Currently

## 2022-07-05 NOTE — Progress Notes (Signed)
nope

## 2022-07-11 ENCOUNTER — Other Ambulatory Visit: Payer: Medicare HMO

## 2022-07-12 ENCOUNTER — Other Ambulatory Visit (INDEPENDENT_AMBULATORY_CARE_PROVIDER_SITE_OTHER): Payer: Medicare HMO

## 2022-07-12 DIAGNOSIS — E1165 Type 2 diabetes mellitus with hyperglycemia: Secondary | ICD-10-CM | POA: Diagnosis not present

## 2022-07-12 LAB — HEMOGLOBIN A1C: Hgb A1c MFr Bld: 6.9 % — ABNORMAL HIGH (ref 4.6–6.5)

## 2022-07-12 LAB — BASIC METABOLIC PANEL
BUN: 13 mg/dL (ref 6–23)
CO2: 32 mEq/L (ref 19–32)
Calcium: 10.3 mg/dL (ref 8.4–10.5)
Chloride: 100 mEq/L (ref 96–112)
Creatinine, Ser: 0.8 mg/dL (ref 0.40–1.20)
GFR: 69.02 mL/min (ref >60.00)
Glucose, Bld: 107 mg/dL — ABNORMAL HIGH (ref 70–99)
Potassium: 3.7 mEq/L (ref 3.5–5.1)
Sodium: 138 mEq/L (ref 135–145)

## 2022-07-13 ENCOUNTER — Ambulatory Visit (INDEPENDENT_AMBULATORY_CARE_PROVIDER_SITE_OTHER): Payer: Medicare HMO | Admitting: Endocrinology

## 2022-07-13 ENCOUNTER — Encounter: Payer: Self-pay | Admitting: Endocrinology

## 2022-07-13 VITALS — BP 138/70 | HR 62 | Ht 63.75 in | Wt 192.0 lb

## 2022-07-13 DIAGNOSIS — E042 Nontoxic multinodular goiter: Secondary | ICD-10-CM

## 2022-07-13 DIAGNOSIS — Z8639 Personal history of other endocrine, nutritional and metabolic disease: Secondary | ICD-10-CM

## 2022-07-13 DIAGNOSIS — E119 Type 2 diabetes mellitus without complications: Secondary | ICD-10-CM | POA: Diagnosis not present

## 2022-07-13 DIAGNOSIS — E7800 Pure hypercholesterolemia, unspecified: Secondary | ICD-10-CM

## 2022-07-13 NOTE — Progress Notes (Signed)
Patient ID: Janet [place], female   DOB: May 15, 1941, 81 y.o.   MRN: [id number]   Reason for Appointment: follow-up of various problems  History of Present Illness   Type 2 DIABETES MELITUS, date of diagnosis 02/2012     She has had mild diabetes which has been well controlled with metformin ER alone. Has had no side effects from metformin  She also has had diabetes education in 2013 . Oral hypoglycemic drugs: Metformin ER using 750 mg tablets, 1.5 g daily.       Side effects from medications: None  Her A1c is about the same at 6.9  Fructosamine last 250  Current management, blood sugar patterns and problems identified:   She has been taking metformin as before She checks her blood sugars only occasionally and still has mildly increased blood sugars in the mornings but not as consistently Only rarely has high readings after breakfast but not checked after dinner Weight has leveled off Lab glucose 107 fasting late morning Previously had benefited from discussing meal planning with the dietitian Not able to do any exercise because of musculoskeletal issues   Blood sugar readings from his download of ultra 2 meter  AVERAGE overall 126 FASTING average 128 Range 102-169 with highest reading midday and no readings after dinner  Previously:  PRE-MEAL Fasting Lunch Dinner Bedtime Overall  Glucose range: 120-147      Mean/median: 135    133    Previous visits with dietitian: 4/22     Wt Readings from Last 3 Encounters:  07/13/22 192 lb (87.1 kg)  12/21/21 189 lb 3.2 oz (85.8 kg)  06/22/21 190 lb 12.8 oz (86.5 kg)     Lab Results  Component Value Date   HGBA1C 6.9 (H) 07/12/2022   HGBA1C 7.0 (H) 12/19/2021   HGBA1C 6.9 (H) 06/20/2021   Lab Results  Component Value Date   MICROALBUR 2.4 (H) 12/19/2021   LDLCALC 83 12/19/2021   CREATININE 0.80 07/12/2022     Other active problems evaluated today: See review of systems  LABS:  Lab on 07/12/2022   Component Date Value Ref Range Status   Sodium 07/12/2022 138  135 - 145 mEq/L Final   Potassium 07/12/2022 3.7  3.5 - 5.1 mEq/L Final   Chloride 07/12/2022 100  96 - 112 mEq/L Final   CO2 07/12/2022 32  19 - 32 mEq/L Final   Glucose, Bld 07/12/2022 107 (H)  70 - 99 mg/dL Final   BUN 07/12/2022 13  6 - 23 mg/dL Final   Creatinine, Ser 07/12/2022 0.80  0.40 - 1.20 mg/dL Final   GFR 07/12/2022 69.02  >60.00 mL/min Final   Calculated using the CKD-EPI Creatinine Equation (2021)   Calcium 07/12/2022 10.3  8.4 - 10.5 mg/dL Final   Hgb A1c MFr Bld 07/12/2022 6.9 (H)  4.6 - 6.5 % Final   Glycemic Control Guidelines for People with Diabetes:Non Diabetic:  <6%Goal of Therapy: <7%Additional Action Suggested:  >8%     Allergies as of 07/13/2022       Reactions   Ace Inhibitors Swelling, Other (See Comments)   Angioedema   Keflex [cephalexin] Swelling, Other (See Comments)   Lips became swollwn   Rifadin [rifampin] Swelling, Other (See Comments)   Lips became swollen        Medication List        Accurate as of July 13, 2022 11:59 PM. If you have any questions, ask your nurse or doctor.  acetaminophen 325 MG tablet Commonly known as: TYLENOL Take 325-650 mg by mouth every 8 (eight) hours as needed (for headaches).   amLODipine 5 MG tablet Commonly known as: NORVASC Take 5 mg by mouth daily.   diclofenac 1.3 % Ptch Commonly known as: FLECTOR Place 1 patch onto the skin 2 (two) times daily.   doxycycline 100 MG capsule Commonly known as: VIBRAMYCIN Take 100 mg by mouth 2 (two) times daily.   hydrochlorothiazide 25 MG tablet Commonly known as: HYDRODIURIL Take 25 mg by mouth daily.   hydrOXYzine 25 MG tablet Commonly known as: ATARAX Take 25 mg by mouth 3 (three) times daily as needed.   metFORMIN 750 MG 24 hr tablet Commonly known as: GLUCOPHAGE-XR TAKE 2 TABLETS BY MOUTH ONCE DAILY WITH BREAKFAST   metoprolol succinate 50 MG 24 hr tablet Commonly known  as: TOPROL-XL Take 50 mg by mouth daily.   Accu-Chek Guide Me w/Device Kit by Does not apply route.   ONE TOUCH ULTRA 2 w/Device Kit Use to check blood sugars three times daily DX: P23.3   OneTouch Delica Plus [id number] Misc TEST BLOOD SUGAR THREE TIMES DAILY   OneTouch Ultra test strip Generic drug: glucose blood TEST BLOOD SUGAR THREE TIMES DAILY   simvastatin 20 MG tablet Commonly known as: ZOCOR Take 20 mg by mouth every evening.   SYSTANE OP Place 1 drop into both eyes 2 (two) times daily.   Vitamin D3 125 MCG (5000 UT) Caps Take 5,000 Units by mouth daily.        Allergies:  Allergies  Allergen Reactions   Ace Inhibitors Swelling and Other (See Comments)    Angioedema    Keflex [Cephalexin] Swelling and Other (See Comments)    Lips became swollwn   Rifadin [Rifampin] Swelling and Other (See Comments)    Lips became swollen    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 Not seen on most recent TEE 2019.   Cardiomyopathy in other disease    Cataracts, bilateral    Diabetes mellitus    Type 2   Generalized osteoarthritis    GERD (gastroesophageal reflux disease)    HLD (hyperlipidemia)    Hypertension    Hypothyroidism    LBBB (left bundle branch block)    PAF (paroxysmal atrial fibrillation) ([place])    a. s/p DCCV 06/2017, on Xarelto   Phlebitis    S/P TAVR (transcatheter aortic valve replacement) 04/15/2018   Edwards Sapien 3 THV (size 23 mm, model # 9600TFX, serial # [id number]) via the TF approach   Severe aortic stenosis    a. s/p TAVR 04/2018.   Stroke [facility]) 2008    Past Surgical History:  Procedure Laterality Date   ABDOMINAL HYSTERECTOMY     BIOPSY  05/19/2019   Procedure: BIOPSY;  Surgeon: Ronald Lobo, MD;  Location: [place] ENDOSCOPY;  Service: Endoscopy;;   BREAST BIOPSY Left    years ago at "a doctor's office"   COLONOSCOPY      COLONOSCOPY WITH PROPOFOL N/A 10/02/2018   Procedure: COLONOSCOPY WITH PROPOFOL;  Surgeon: Arta Silence, MD;  Location: [place];  Service: Endoscopy;  Laterality: N/A;   ENTEROSCOPY N/A 05/19/2019   Procedure: ENTEROSCOPY;  Surgeon: Ronald Lobo, MD;  Location: [place];  Service: Endoscopy;  Laterality: N/A;   ESOPHAGOGASTRODUODENOSCOPY (EGD) WITH PROPOFOL N/A 10/02/2018   Procedure: ESOPHAGOGASTRODUODENOSCOPY (EGD) WITH PROPOFOL;  Surgeon: Arta Silence, MD;  Location: [facility] ENDOSCOPY;  Service: Endoscopy;  Laterality: N/A;   ESOPHAGOGASTRODUODENOSCOPY (EGD) WITH PROPOFOL N/A 04/23/2019   Procedure: ESOPHAGOGASTRODUODENOSCOPY (EGD) WITH PROPOFOL;  Surgeon: Wonda Horner, MD;  Location: [facility] ENDOSCOPY;  Service: Endoscopy;  Laterality: N/A;   EYE SURGERY Bilateral    cataract removal   GIVENS CAPSULE STUDY N/A 10/02/2018   Procedure: GIVENS CAPSULE STUDY;  Surgeon: Arta Silence, MD;  Location: [facility] [place] ENDOSCOPY;  Service: Endoscopy;  Laterality: N/A;   RIGHT/LEFT HEART CATH AND CORONARY ANGIOGRAPHY N/A 03/06/2018   Procedure: RIGHT/LEFT HEART CATH AND CORONARY ANGIOGRAPHY;  Surgeon: Belva Crome, MD;  Location: [place] CV LAB;  Service: Cardiovascular;  Laterality: N/A;   SMALL BOWEL ENTEROSCOPY  05/19/2019   TEE WITHOUT CARDIOVERSION N/A 04/01/2018   Procedure: TRANSESOPHAGEAL ECHOCARDIOGRAM (TEE);  Surgeon: Jerline Pain, MD;  Location: [facility] ENDOSCOPY;  Service: Cardiovascular;  Laterality: N/A;   TEE WITHOUT CARDIOVERSION N/A 04/15/2018   Procedure: TRANSESOPHAGEAL ECHOCARDIOGRAM (TEE);  Surgeon: Sherren Mocha, MD;  Location: [place];  Service: Open Heart Surgery;  Laterality: N/A;   TOTAL KNEE ARTHROPLASTY Right 04/14/2013   Dr Marlou Sa   TOTAL KNEE ARTHROPLASTY Right 04/14/2013   Procedure: TOTAL KNEE ARTHROPLASTY;  Surgeon: Meredith Pel, MD;  Location: [place];  Service: Orthopedics;  Laterality: Right;   TRANSCATHETER AORTIC VALVE REPLACEMENT, TRANSFEMORAL  04/15/2018    TRANSCATHETER AORTIC VALVE REPLACEMENT, TRANSFEMORAL Bilateral 04/15/2018   Procedure: TRANSCATHETER AORTIC VALVE REPLACEMENT, TRANSFEMORAL;  Surgeon: Sherren Mocha, MD;  Location: [place];  Service: Open Heart Surgery;  Laterality: Bilateral;    Family History  Problem Relation Age of Onset   Stroke Mother    Hypertension Mother    Hypertension Father    Heart attack Neg Hx     Social History:  reports that she has never smoked. She has never used smokeless tobacco. She reports that she does not drink alcohol and does not use drugs.  Review of Systems:  HYPERTENSION:  she has been treated with 5 mg amlodipine, 50 mg metoprolol and hydrochlorothiazide 25 mg, followed by PCP. She had swelling of the lips with ramipril and has not been prescribed an ARB drug  HYPERLIPIDEMIA: The lipid abnormality consists of elevated LDL treated with simvastatin 20 mg by PCP and LDL is controlled    LDL as follows:   Lab Results  Component Value Date   CHOL 143 12/19/2021   HDL 44.00 12/19/2021   LDLCALC 83 12/19/2021   TRIG 81.0 12/19/2021   CHOLHDL 3 12/19/2021    Multinodular goiter:  She has had a long-standing multinodular goiter, last ultrasound was done in 05/2012 which showed the largest nodule to be 4.1 cm nodule in the isthmus  She only has occasional difficulty with feeling of choking in her throat but no difficulty swallowing, this is not any worse  Findings on last exam: There is a 3.5 cm midline thyroid nodule which is relatively soft Right thyroid lobe is rubbery and about 2-2.5 times normal, left side is smaller  TSH has been normal  Lab Results  Component Value Date   TSH 2.86 12/07/2020     HYPERCALCEMIA: has had occasional high levels just above normal Recently again has upper normal readings She does take 25 mg HCTZ PTH low normal previously   Lab Results  Component Value Date   CALCIUM 10.3 07/12/2022   CALCIUM 10.2 12/19/2021   CALCIUM 10.2 06/20/2021    CALCIUM 10.3 12/07/2020   CALCIUM 10.3 08/08/2020  Lab Results  Component Value Date   PTH 23 08/22/2017   CALCIUM 10.3 07/12/2022   CAION 1.14 (L) 04/15/2018     Examination: BP 138/70   Pulse 62   Ht 5' 3.75" (1.619 m)   Wt 192 lb (87.1 kg)   SpO2 96%   BMI 33.22 kg/m   Body mass index is 33.22 kg/m.   Has a prominent 3.5 cm midline thyroid nodule which is relatively soft and smooth Right thyroid lobe is rubbery and about 2-2.5 times normal, left side is 1-1/2-2 times normal, smooth and firm   ASSESSMENT/ PLAN:   Diabetes type 2 with obesity  See history of present illness for discussion of current diabetes management, blood sugar patterns and problems identified  She is on Metformin 1500 mg long-term  A1c is consistently near 7%  She has fairly good blood sugars at home with highest fasting 137 Overall has good blood sugars and considering her age still has excellent control Has maintained her weight Usually is watching her diet Again not able to exercise much  She will continue metformin monotherapy She can do more readings after dinner  Multinodular goiter: Her exam is unchanged from previous  Upper normal calcium levels without hyperthyroidism being diagnosed, no change  There are no Patient Instructions on file for this visit.   Elayne Snare 07/15/2022, 8:50 PM   Note: This office note was prepared with Dragon voice recognition system technology. Any transcriptional errors that result from this process are unintentional.

## 2022-07-19 ENCOUNTER — Ambulatory Visit (INDEPENDENT_AMBULATORY_CARE_PROVIDER_SITE_OTHER): Payer: Medicare HMO | Admitting: Physical Medicine and Rehabilitation

## 2022-07-19 ENCOUNTER — Ambulatory Visit: Payer: Self-pay

## 2022-07-19 VITALS — BP 145/77 | HR 66

## 2022-07-19 DIAGNOSIS — M5416 Radiculopathy, lumbar region: Secondary | ICD-10-CM

## 2022-07-19 MED ORDER — METHYLPREDNISOLONE ACETATE 80 MG/ML IJ SUSP
40.0000 mg | Freq: Once | INTRAMUSCULAR | Status: AC
  Administered 2022-07-19: 40 mg

## 2022-07-19 NOTE — Progress Notes (Signed)
Numeric Pain Rating Scale and Functional Assessment Average Pain 7   In the last MONTH (on 0-10 scale) has pain interfered with the following?  1. General activity like being  able to carry out your everyday physical activities such as walking, climbing stairs, carrying groceries, or moving a chair?  Rating(10)   +Driver, -BT, -Dye Allergies.  Walking makes pain worse. Pain in middle part of lower back, radiates down both legs. Tylenol for pain

## 2022-07-19 NOTE — Patient Instructions (Signed)

## 2022-07-30 NOTE — Progress Notes (Signed)
Janet Williamson - 81 y.o. female MRN [id number]  Date of birth: 10-05-1941  Office Visit Note: Visit Date: 07/19/2022 PCP: Kelton Pillar, MD Referred by: Kelton Pillar, MD  Subjective: Chief Complaint  Patient presents with   Lower Back - Pain   HPI:  Janet Williamson is a 81 y.o. female who comes in today at the request of Annie Main, PA-C for planned Left L4-5 Lumbar Interlaminar epidural steroid injection with fluoroscopic guidance.  The patient has failed conservative care including home exercise, medications, time and activity modification.  This injection will be diagnostic and hopefully therapeutic.  Please see requesting physician notes for further details and justification.   ROS Otherwise per HPI.  Assessment & Plan: Visit Diagnoses:    ICD-10-CM   1. Lumbar radiculopathy  M54.16 XR C-ARM NO REPORT    Epidural Steroid injection    methylPREDNISolone acetate (DEPO-MEDROL) injection 40 mg      Plan: No additional findings.   Meds & Orders:  Meds ordered this encounter  Medications   methylPREDNISolone acetate (DEPO-MEDROL) injection 40 mg    Orders Placed This Encounter  Procedures   XR C-ARM NO REPORT   Epidural Steroid injection    Follow-up: Return for visit to requesting provider as needed.   Procedures: No procedures performed  Lumbar Epidural Steroid Injection - Interlaminar Approach with Fluoroscopic Guidance  Patient: Janet Williamson      Date of Birth: September 21, 1941 MRN: [id number] PCP: Kelton Pillar, MD      Visit Date: 07/19/2022   Universal Protocol:     Consent Given By: the patient  Position: PRONE  Additional Comments: Vital signs were monitored before and after the procedure. Patient was prepped and draped in the usual sterile fashion. The correct patient, procedure, and site was verified.   Injection Procedure Details:   Procedure diagnoses: Lumbar radiculopathy [M54.16]   Meds Administered:  Meds ordered this  encounter  Medications   methylPREDNISolone acetate (DEPO-MEDROL) injection 40 mg     Laterality: Left  Location/Site:  L4-5  Needle: 3.5 in., 20 ga. Tuohy  Needle Placement: Paramedian epidural  Findings:   -Comments: Excellent flow of contrast into the epidural space.  Procedure Details: Using a paramedian approach from the side mentioned above, the region overlying the inferior lamina was localized under fluoroscopic visualization and the soft tissues overlying this structure were infiltrated with 4 ml. of 1% Lidocaine without Epinephrine. The Tuohy needle was inserted into the epidural space using a paramedian approach.   The epidural space was localized using loss of resistance along with counter oblique bi-planar fluoroscopic views.  After negative aspirate for air, blood, and CSF, a 2 ml. volume of Isovue-250 was injected into the epidural space and the flow of contrast was observed. Radiographs were obtained for documentation purposes.    The injectate was administered into the level noted above.   Additional Comments:  The patient tolerated the procedure well Dressing: 2 x 2 sterile gauze and Band-Aid    Post-procedure details: Patient was observed during the procedure. Post-procedure instructions were reviewed.  Patient left the clinic in stable condition.   Clinical History: MRI LUMBAR SPINE WITHOUT CONTRAST   TECHNIQUE: Multiplanar, multisequence MR imaging of the lumbar spine was performed. No intravenous contrast was administered.   COMPARISON:  Lumbar spine radiographs 12/26/2019   FINDINGS: Segmentation: 5 non rib-bearing lumbar type vertebral bodies are present. The lowest fully formed vertebral body is L5.   Alignment: No significant listhesis is present. Mild  leftward curvature is centered at L3-4.   Vertebrae:  Marrow signal and vertebral body heights are normal.   Conus medullaris and cauda equina: Conus extends to the L1 level. Conus and  cauda equina appear normal.   Paraspinal and other soft tissues: Limited imaging the abdomen is unremarkable. There is no significant adenopathy. No solid organ lesions are present.   Disc levels:   T12-L1: Negative.   L1-2: Negative.   L2-3: Negative.   L3-4: A leftward disc protrusion is present. Mild left subarticular narrowing is present. Foramina are patent bilaterally.   L4-5: A broad-based disc protrusion is present. Moderate facet hypertrophy and ligamentum flavum thickening is noted. Mild central canal stenosis is present with subarticular narrowing bilaterally. Mild to moderate foraminal narrowing is worse right than left.   L5-S1: A broad-based disc protrusion is present. Moderate facet hypertrophy and ligamentum flavum thickening is noted. Mild subarticular and foraminal narrowing is present bilaterally.   IMPRESSION: 1. Broad-based disc protrusions and facet hypertrophy at L4-5 greater than L5-S1. 2. Mild central canal stenosis with subarticular narrowing bilaterally at L4-5. 3. Mild to moderate foraminal narrowing bilaterally at L4-5 is worse right than left. 4. Mild subarticular and foraminal narrowing bilaterally at L5-S1. 5. Mild left subarticular narrowing at L3-4.     Electronically Signed   By: San Morelle M.D.   On: 07/02/2022 05:49     Objective:  VS:  HT:    WT:   BMI:     BP:(!) 145/77  HR:66bpm  TEMP: ( )  RESP:  Physical Exam Vitals and nursing note reviewed.  Constitutional:      General: She is not in acute distress.    Appearance: Normal appearance. She is not ill-appearing.  HENT:     Head: Normocephalic and atraumatic.     Right Ear: External ear normal.     Left Ear: External ear normal.  Eyes:     Extraocular Movements: Extraocular movements intact.  Cardiovascular:     Rate and Rhythm: Normal rate.     Pulses: Normal pulses.  Pulmonary:     Effort: Pulmonary effort is normal. No respiratory distress.   Abdominal:     General: There is no distension.     Palpations: Abdomen is soft.  Musculoskeletal:        General: Tenderness present.     Cervical back: Neck supple.     Right lower leg: No edema.     Left lower leg: No edema.     Comments: Patient has good distal strength with no pain over the greater trochanters.  No clonus or focal weakness.  Skin:    Findings: No erythema, lesion or rash.  Neurological:     General: No focal deficit present.     Mental Status: She is alert and oriented to person, place, and time.     Sensory: No sensory deficit.     Motor: No weakness or abnormal muscle tone.     Coordination: Coordination normal.  Psychiatric:        Mood and Affect: Mood normal.        Behavior: Behavior normal.      Imaging: No results found.

## 2022-07-30 NOTE — Procedures (Signed)
Lumbar Epidural Steroid Injection - Interlaminar Approach with Fluoroscopic Guidance  Patient: Janet Williamson      Date of Birth: 04/08/1941 MRN: [id number] PCP: Kelton Pillar, MD      Visit Date: 07/19/2022   Universal Protocol:     Consent Given By: the patient  Position: PRONE  Additional Comments: Vital signs were monitored before and after the procedure. Patient was prepped and draped in the usual sterile fashion. The correct patient, procedure, and site was verified.   Injection Procedure Details:   Procedure diagnoses: Lumbar radiculopathy [M54.16]   Meds Administered:  Meds ordered this encounter  Medications   methylPREDNISolone acetate (DEPO-MEDROL) injection 40 mg     Laterality: Left  Location/Site:  L4-5  Needle: 3.5 in., 20 ga. Tuohy  Needle Placement: Paramedian epidural  Findings:   -Comments: Excellent flow of contrast into the epidural space.  Procedure Details: Using a paramedian approach from the side mentioned above, the region overlying the inferior lamina was localized under fluoroscopic visualization and the soft tissues overlying this structure were infiltrated with 4 ml. of 1% Lidocaine without Epinephrine. The Tuohy needle was inserted into the epidural space using a paramedian approach.   The epidural space was localized using loss of resistance along with counter oblique bi-planar fluoroscopic views.  After negative aspirate for air, blood, and CSF, a 2 ml. volume of Isovue-250 was injected into the epidural space and the flow of contrast was observed. Radiographs were obtained for documentation purposes.    The injectate was administered into the level noted above.   Additional Comments:  The patient tolerated the procedure well Dressing: 2 x 2 sterile gauze and Band-Aid    Post-procedure details: Patient was observed during the procedure. Post-procedure instructions were reviewed.  Patient left the clinic in stable  condition.

## 2022-08-02 ENCOUNTER — Ambulatory Visit (INDEPENDENT_AMBULATORY_CARE_PROVIDER_SITE_OTHER): Payer: Medicare HMO | Admitting: Physical Medicine and Rehabilitation

## 2022-08-02 ENCOUNTER — Encounter: Payer: Self-pay | Admitting: Physical Medicine and Rehabilitation

## 2022-08-02 VITALS — BP 138/77 | HR 79

## 2022-08-02 DIAGNOSIS — M48061 Spinal stenosis, lumbar region without neurogenic claudication: Secondary | ICD-10-CM | POA: Diagnosis not present

## 2022-08-02 DIAGNOSIS — M4726 Other spondylosis with radiculopathy, lumbar region: Secondary | ICD-10-CM | POA: Diagnosis not present

## 2022-08-02 DIAGNOSIS — M5416 Radiculopathy, lumbar region: Secondary | ICD-10-CM

## 2022-08-02 NOTE — Progress Notes (Signed)
Janet Williamson - 81 y.o. female MRN [id number]  Date of birth: [birth year]  Office Visit Note: Visit Date: 08/02/2022 PCP: Kelton Pillar, MD Referred by: Kelton Pillar, MD  Subjective: Chief Complaint  Patient presents with   Lower Back - Pain   HPI: Janet Williamson is a 81 y.o. female who comes in today for evaluation of chronic, worsening and severe bilateral lower back pain radiating to buttocks and down legs to feet. Pain ongoing for several months and is exacerbated by movement and activity. She describes her pain as sore and aching in nature, currently rates as 0 out of 10. Some relief of pain with home exercise regimen, rest and use of medications. Patient also reports she attends exercise class multiple times a week. Recent lumbar MRI imaging exhibits multi level degenerative changes, moderate facet arthropathy at L4-L5 and L5-S1, there is mild central canal stenosis with subarticular narrowing bilaterally at L4-5. Leftward disc protrusion is present at the level of L3-L4. No high grade spinal canal stenosis noted. Patient recently underwent left L4-L5 interlaminar epidural steroid injection in our office on 07/19/2022 and reports 100% relief of pain that continues to sustain. Patient denies focal weakness, numbness and tingling. Patient denies recent trauma or falls.    Review of Systems  Musculoskeletal:  Negative for back pain.  Neurological:  Negative for tingling, sensory change, focal weakness and weakness.  All other systems reviewed and are negative.  Otherwise per HPI.  Assessment & Plan: Visit Diagnoses:    ICD-10-CM   1. Lumbar radiculopathy  M54.16     2. Other spondylosis with radiculopathy, lumbar region  M47.26     3. Foraminal stenosis of lumbar region  M48.061        Plan: Findings:  Chronic bilateral lower back pain radiating to buttocks and down legs to feet. Complete resolution of pain with recent left L4-L5 interlaminar epidural steroid injection,  pain relief continues to sustain. Continues to use conservative therapies such as rest and medications as needed. Patients clinical presentation and exam are complex, her pain pattern does not directly correlate with lumbar MRI imaging, seems to be more non dermatomal. If injection continues to provide good pain relief we can repeat this procedure infrequently as needed. If her pain returns we did discuss course of formal physical therapy. Patient encouraged to continue with home exercise regimen. No red flag symptoms noted upon exam.     Meds & Orders: No orders of the defined types were placed in this encounter.  No orders of the defined types were placed in this encounter.   Follow-up: Return if symptoms worsen or fail to improve.   Procedures: No procedures performed      Clinical History: MRI LUMBAR SPINE WITHOUT CONTRAST   TECHNIQUE: Multiplanar, multisequence MR imaging of the lumbar spine was performed. No intravenous contrast was administered.   COMPARISON:  Lumbar spine radiographs 12/26/2019   FINDINGS: Segmentation: 5 non rib-bearing lumbar type vertebral bodies are present. The lowest fully formed vertebral body is L5.   Alignment: No significant listhesis is present. Mild leftward curvature is centered at L3-4.   Vertebrae:  Marrow signal and vertebral body heights are normal.   Conus medullaris and cauda equina: Conus extends to the L1 level. Conus and cauda equina appear normal.   Paraspinal and other soft tissues: Limited imaging the abdomen is unremarkable. There is no significant adenopathy. No solid organ lesions are present.   Disc levels:   T12-L1: Negative.   L1-2:  Negative.   L2-3: Negative.   L3-4: A leftward disc protrusion is present. Mild left subarticular narrowing is present. Foramina are patent bilaterally.   L4-5: A broad-based disc protrusion is present. Moderate facet hypertrophy and ligamentum flavum thickening is noted. Mild  central canal stenosis is present with subarticular narrowing bilaterally. Mild to moderate foraminal narrowing is worse right than left.   L5-S1: A broad-based disc protrusion is present. Moderate facet hypertrophy and ligamentum flavum thickening is noted. Mild subarticular and foraminal narrowing is present bilaterally.   IMPRESSION: 1. Broad-based disc protrusions and facet hypertrophy at L4-5 greater than L5-S1. 2. Mild central canal stenosis with subarticular narrowing bilaterally at L4-5. 3. Mild to moderate foraminal narrowing bilaterally at L4-5 is worse right than left. 4. Mild subarticular and foraminal narrowing bilaterally at L5-S1. 5. Mild left subarticular narrowing at L3-4.     Electronically Signed   By: San Morelle M.D.   On: 07/02/2022 05:49   She reports that she has never smoked. She has never used smokeless tobacco.  Recent Labs    12/19/21 0916 07/12/22 0943  HGBA1C 7.0* 6.9*    Objective:  VS:  HT:    WT:   BMI:     BP:138/77  HR:79bpm  TEMP: ( )  RESP:  Physical Exam Vitals and nursing note reviewed.  HENT:     Head: Normocephalic and atraumatic.     Right Ear: External ear normal.     Left Ear: External ear normal.     Nose: Nose normal.     Mouth/Throat:     Mouth: Mucous membranes are moist.  Eyes:     Extraocular Movements: Extraocular movements intact.  Cardiovascular:     Rate and Rhythm: Normal rate.     Pulses: Normal pulses.  Pulmonary:     Effort: Pulmonary effort is normal.  Abdominal:     General: Abdomen is flat. There is no distension.  Musculoskeletal:        General: Tenderness present.     Cervical back: Normal range of motion.     Comments: Pt rises from seated position to standing without difficulty. Good lumbar range of motion. Strong distal strength without clonus, no pain upon palpation of greater trochanters. Sensation intact bilaterally. Walks independently, gait steady.   Skin:    General: Skin is  warm and dry.     Capillary Refill: Capillary refill takes less than 2 seconds.  Neurological:     General: No focal deficit present.     Mental Status: She is alert and oriented to person, place, and time.  Psychiatric:        Mood and Affect: Mood normal.        Behavior: Behavior normal.     Ortho Exam  Imaging: No results found.  Past Medical/Family/Surgical/Social History: Medications & Allergies reviewed per EMR, new medications updated. Patient Active Problem List   Diagnosis Date Noted   GI bleed 04/21/2019   Symptomatic anemia 09/30/2018   S/P TAVR (transcatheter aortic valve replacement) 04/15/2018   Severe aortic stenosis    PAF (paroxysmal atrial fibrillation) ([place]) 03/04/2018   History of stroke 11/03/2014   Hypertension    DM (diabetes mellitus) ([place]) 05/06/2013   HTN (hypertension) 05/06/2013   Anemia 05/06/2013   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 Not seen on most recent TEE 2019.  Cardiomyopathy in other disease    Cataracts, bilateral    Diabetes mellitus    Type 2   Generalized osteoarthritis    GERD (gastroesophageal reflux disease)    HLD (hyperlipidemia)    Hypertension    Hypothyroidism    LBBB (left bundle branch block)    PAF (paroxysmal atrial fibrillation) ([facility])    a. s/p DCCV 06/2017, on Xarelto   Phlebitis    S/P TAVR (transcatheter aortic valve replacement) 04/15/2018   Edwards Sapien 3 THV (size 23 mm, model # 9600TFX, serial # [id number]) via the TF approach   Severe aortic stenosis    a. s/p TAVR 04/2018.   Stroke [facility]) 2008   Family History  Problem Relation Age of Onset   Stroke Mother    Hypertension Mother    Hypertension Father    Heart attack Neg Hx    Past Surgical History:  Procedure Laterality Date   ABDOMINAL HYSTERECTOMY     BIOPSY  05/19/2019   Procedure: BIOPSY;  Surgeon: Bernette Redbird, MD;  Location:  [facility] ENDOSCOPY;  Service: Endoscopy;;   BREAST BIOPSY Left    years ago at "a doctor's office"   COLONOSCOPY     COLONOSCOPY WITH PROPOFOL N/A 10/02/2018   Procedure: COLONOSCOPY WITH PROPOFOL;  Surgeon: [name], MD;  Location: [facility] ENDOSCOPY;  Service: Endoscopy;  Laterality: N/A;   ENTEROSCOPY N/A 05/19/2019   Procedure: ENTEROSCOPY;  Surgeon: Bernette Redbird, MD;  Location: [facility] ENDOSCOPY;  Service: Endoscopy;  Laterality: N/A;   ESOPHAGOGASTRODUODENOSCOPY (EGD) WITH PROPOFOL N/A 10/02/2018   Procedure: ESOPHAGOGASTRODUODENOSCOPY (EGD) WITH PROPOFOL;  Surgeon: [name], MD;  Location: [facility] ENDOSCOPY;  Service: Endoscopy;  Laterality: N/A;   ESOPHAGOGASTRODUODENOSCOPY (EGD) WITH PROPOFOL N/A 04/23/2019   Procedure: ESOPHAGOGASTRODUODENOSCOPY (EGD) WITH PROPOFOL;  Surgeon: Graylin Shiver, MD;  Location: [facility] ENDOSCOPY;  Service: Endoscopy;  Laterality: N/A;   EYE SURGERY Bilateral    cataract removal   GIVENS CAPSULE STUDY N/A 10/02/2018   Procedure: GIVENS CAPSULE STUDY;  Surgeon: [name], MD;  Location: [facility] ENDOSCOPY;  Service: Endoscopy;  Laterality: N/A;   RIGHT/LEFT HEART CATH AND CORONARY ANGIOGRAPHY N/A 03/06/2018   Procedure: RIGHT/LEFT HEART CATH AND CORONARY ANGIOGRAPHY;  Surgeon: Lyn Records, MD;  Location: [facility] INVASIVE CV LAB;  Service: Cardiovascular;  Laterality: N/A;   SMALL BOWEL ENTEROSCOPY  05/19/2019   TEE WITHOUT CARDIOVERSION N/A 04/01/2018   Procedure: TRANSESOPHAGEAL ECHOCARDIOGRAM (TEE);  Surgeon: Jake Bathe, MD;  Location: [facility] ENDOSCOPY;  Service: Cardiovascular;  Laterality: N/A;   TEE WITHOUT CARDIOVERSION N/A 04/15/2018   Procedure: TRANSESOPHAGEAL ECHOCARDIOGRAM (TEE);  Surgeon: Tonny Bollman, MD;  Location: [facility] OR;  Service: Open Heart Surgery;  Laterality: N/A;   TOTAL KNEE ARTHROPLASTY Right 04/14/2013   Dr August Saucer   TOTAL KNEE ARTHROPLASTY Right 04/14/2013   Procedure: TOTAL KNEE ARTHROPLASTY;  Surgeon: Cammy Copa, MD;  Location: [facility] OR;   Service: Orthopedics;  Laterality: Right;   TRANSCATHETER AORTIC VALVE REPLACEMENT, TRANSFEMORAL  04/15/2018   TRANSCATHETER AORTIC VALVE REPLACEMENT, TRANSFEMORAL Bilateral 04/15/2018   Procedure: TRANSCATHETER AORTIC VALVE REPLACEMENT, TRANSFEMORAL;  Surgeon: Tonny Bollman, MD;  Location: [facility] OR;  Service: Open Heart Surgery;  Laterality: Bilateral;   Social History   Occupational History   Not on file  Tobacco Use   Smoking status: Never   Smokeless tobacco: Never  Vaping Use   Vaping Use: Never used  Substance and Sexual Activity   Alcohol use: No   Drug use: No   Sexual activity: Not Currently

## 2022-08-02 NOTE — Progress Notes (Signed)
Numeric Pain Rating Scale and Functional Assessment Average Pain 0   In the last MONTH (on 0-10 scale) has pain interfered with the following?  1. General activity like being  able to carry out your everyday physical activities such as walking, climbing stairs, carrying groceries, or moving a chair?  Rating(0)    Back pain is better. Started having relief after shot on the following day

## 2022-08-16 ENCOUNTER — Other Ambulatory Visit: Payer: Self-pay | Admitting: Endocrinology

## 2022-09-07 ENCOUNTER — Other Ambulatory Visit: Payer: Self-pay | Admitting: Family Medicine

## 2022-09-07 ENCOUNTER — Encounter: Payer: Self-pay | Admitting: Family Medicine

## 2022-09-07 DIAGNOSIS — R32 Unspecified urinary incontinence: Secondary | ICD-10-CM | POA: Diagnosis not present

## 2022-09-07 DIAGNOSIS — E118 Type 2 diabetes mellitus with unspecified complications: Secondary | ICD-10-CM | POA: Diagnosis not present

## 2022-09-07 DIAGNOSIS — I350 Nonrheumatic aortic (valve) stenosis: Secondary | ICD-10-CM | POA: Diagnosis not present

## 2022-09-07 DIAGNOSIS — R229 Localized swelling, mass and lump, unspecified: Secondary | ICD-10-CM

## 2022-09-07 DIAGNOSIS — M8589 Other specified disorders of bone density and structure, multiple sites: Secondary | ICD-10-CM

## 2022-09-07 DIAGNOSIS — E042 Nontoxic multinodular goiter: Secondary | ICD-10-CM | POA: Diagnosis not present

## 2022-09-07 DIAGNOSIS — D6869 Other thrombophilia: Secondary | ICD-10-CM | POA: Diagnosis not present

## 2022-09-07 DIAGNOSIS — Z0000 Encounter for general adult medical examination without abnormal findings: Secondary | ICD-10-CM | POA: Diagnosis not present

## 2022-09-07 DIAGNOSIS — I10 Essential (primary) hypertension: Secondary | ICD-10-CM | POA: Diagnosis not present

## 2022-09-07 DIAGNOSIS — M8588 Other specified disorders of bone density and structure, other site: Secondary | ICD-10-CM | POA: Diagnosis not present

## 2022-09-07 DIAGNOSIS — Z1159 Encounter for screening for other viral diseases: Secondary | ICD-10-CM | POA: Diagnosis not present

## 2022-09-07 DIAGNOSIS — E785 Hyperlipidemia, unspecified: Secondary | ICD-10-CM | POA: Diagnosis not present

## 2022-09-07 DIAGNOSIS — I639 Cerebral infarction, unspecified: Secondary | ICD-10-CM | POA: Diagnosis not present

## 2022-09-07 DIAGNOSIS — Z1231 Encounter for screening mammogram for malignant neoplasm of breast: Secondary | ICD-10-CM

## 2022-10-05 ENCOUNTER — Other Ambulatory Visit: Payer: Medicare HMO

## 2022-10-09 ENCOUNTER — Ambulatory Visit: Payer: Medicare HMO | Admitting: Endocrinology

## 2022-10-30 ENCOUNTER — Telehealth: Payer: Self-pay | Admitting: Physical Medicine and Rehabilitation

## 2022-10-30 NOTE — Telephone Encounter (Signed)
Patient wants an appt with Dr. Ernestina Patches

## 2022-10-30 NOTE — Telephone Encounter (Signed)
She was seen by Lurena Joiner for her knee pain, so I'm not sure if this was meant for Dr. Ernestina Patches

## 2022-10-30 NOTE — Telephone Encounter (Signed)
Patient called. Returning a call to schedule with Dr. Newton.  ?

## 2022-10-30 NOTE — Telephone Encounter (Signed)
Pt called to set an appt for bilateral knee pains with Dr Ernestina Patches. Please call pt at [phone]

## 2022-10-30 NOTE — Telephone Encounter (Signed)
Attempted to call patient but phone has call block

## 2022-11-05 NOTE — Telephone Encounter (Signed)
Patient is going to make an appointment with Dr. Marlou Sa or Lurena Joiner

## 2022-11-21 ENCOUNTER — Encounter: Payer: Self-pay | Admitting: Orthopedic Surgery

## 2022-11-21 ENCOUNTER — Ambulatory Visit (INDEPENDENT_AMBULATORY_CARE_PROVIDER_SITE_OTHER): Payer: Medicare HMO | Admitting: Orthopedic Surgery

## 2022-11-21 DIAGNOSIS — M1712 Unilateral primary osteoarthritis, left knee: Secondary | ICD-10-CM

## 2022-11-21 MED ORDER — LIDOCAINE HCL 1 % IJ SOLN
5.0000 mL | INTRAMUSCULAR | Status: AC | PRN
  Administered 2022-11-21: 5 mL

## 2022-11-21 MED ORDER — METHYLPREDNISOLONE ACETATE 40 MG/ML IJ SUSP
40.0000 mg | INTRAMUSCULAR | Status: AC | PRN
  Administered 2022-11-21: 40 mg via INTRA_ARTICULAR

## 2022-11-21 MED ORDER — BUPIVACAINE HCL 0.25 % IJ SOLN
4.0000 mL | INTRAMUSCULAR | Status: AC | PRN
  Administered 2022-11-21: 4 mL via INTRA_ARTICULAR

## 2022-11-21 NOTE — Progress Notes (Signed)
Office Visit Note   Patient: Janet Williamson           Date of Birth: 1940/12/18           MRN: [id number] Visit Date: 11/21/2022 Requested by: Kelton Pillar, MD [address] [place],  [place] [address] PCP: Kelton Pillar, MD  Subjective: Chief Complaint  Patient presents with   Right Knee - Pain   Left Knee - Pain    HPI: Janet Williamson is a 82 y.o. female who presents to the office reporting left knee pain.  She has a history of known arthritis.  Last hemoglobin A1c was 7.0.  Injections have helped her in the past.  Last seen in September.  There is old notes are reviewed..                ROS: All systems reviewed are negative as they relate to the chief complaint within the history of present illness.  Patient denies fevers or chills.  Assessment & Plan: Visit Diagnoses:  1. Arthritis of left knee     Plan: Impression is left knee arthritis.  Cortisone injection performed today.  Aspiration of about 10 cc also performed.  Plan is to continue nonweightbearing quad strengthening exercises and follow-up as needed.  She wants to try to avoid knee replacement if possible.  Follow-Up Instructions: No follow-ups on file.   Orders:  No orders of the defined types were placed in this encounter.  No orders of the defined types were placed in this encounter.     Procedures: Large Joint Inj: L knee on 11/21/2022 10:28 PM Indications: diagnostic evaluation, joint swelling and pain Details: 18 G 1.5 in needle, superolateral approach  Arthrogram: No  Medications: 5 mL lidocaine 1 %; 40 mg methylPREDNISolone acetate 40 MG/ML; 4 mL bupivacaine 0.25 % Outcome: tolerated well, no immediate complications Procedure, treatment alternatives, risks and benefits explained, specific risks discussed. Consent was given by the patient. Immediately prior to procedure a time out was called to verify the correct patient, procedure, equipment, support staff and site/side marked  as required. Patient was prepped and draped in the usual sterile fashion.       Clinical Data: No additional findings.  Objective: Vital Signs: There were no vitals taken for this visit.  Physical Exam:  Constitutional: Patient appears well-developed HEENT:  Head: Normocephalic Eyes:EOM are normal Neck: Normal range of motion Cardiovascular: Normal rate Pulmonary/chest: Effort normal Neurologic: Patient is alert Skin: Skin is warm Psychiatric: Patient has normal mood and affect  Ortho Exam: Ortho exam demonstrates range of motion of 5-1 05.  Collateral patient ligaments are stable.  Extensor mechanism intact.  Pedal pulses palpable.  No groin pain on the left with internal and external rotation of the leg.  Mild patellofemoral crepitus is present.  Specialty Comments:  MRI LUMBAR SPINE WITHOUT CONTRAST   TECHNIQUE: Multiplanar, multisequence MR imaging of the lumbar spine was performed. No intravenous contrast was administered.   COMPARISON:  Lumbar spine radiographs 12/26/2019   FINDINGS: Segmentation: 5 non rib-bearing lumbar type vertebral bodies are present. The lowest fully formed vertebral body is L5.   Alignment: No significant listhesis is present. Mild leftward curvature is centered at L3-4.   Vertebrae:  Marrow signal and vertebral body heights are normal.   Conus medullaris and cauda equina: Conus extends to the L1 level. Conus and cauda equina appear normal.   Paraspinal and other soft tissues: Limited imaging the abdomen is unremarkable. There is  no significant adenopathy. No solid organ lesions are present.   Disc levels:   T12-L1: Negative.   L1-2: Negative.   L2-3: Negative.   L3-4: A leftward disc protrusion is present. Mild left subarticular narrowing is present. Foramina are patent bilaterally.   L4-5: A broad-based disc protrusion is present. Moderate facet hypertrophy and ligamentum flavum thickening is noted. Mild central canal  stenosis is present with subarticular narrowing bilaterally. Mild to moderate foraminal narrowing is worse right than left.   L5-S1: A broad-based disc protrusion is present. Moderate facet hypertrophy and ligamentum flavum thickening is noted. Mild subarticular and foraminal narrowing is present bilaterally.   IMPRESSION: 1. Broad-based disc protrusions and facet hypertrophy at L4-5 greater than L5-S1. 2. Mild central canal stenosis with subarticular narrowing bilaterally at L4-5. 3. Mild to moderate foraminal narrowing bilaterally at L4-5 is worse right than left. 4. Mild subarticular and foraminal narrowing bilaterally at L5-S1. 5. Mild left subarticular narrowing at L3-4.     Electronically Signed   By: San Morelle M.D.   On: 07/02/2022 05:49  Imaging: No results found.   PMFS History: Patient Active Problem List   Diagnosis Date Noted   GI bleed 04/21/2019   Symptomatic anemia 09/30/2018   S/P TAVR (transcatheter aortic valve replacement) 04/15/2018   Severe aortic stenosis    PAF (paroxysmal atrial fibrillation) ([place]) 03/04/2018   History of stroke 11/03/2014   Hypertension    DM (diabetes mellitus) ([place]) 05/06/2013   HTN (hypertension) 05/06/2013   Anemia 05/06/2013   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 Not seen on most recent TEE 2019.   Cardiomyopathy in other disease    Cataracts, bilateral    Diabetes mellitus    Type 2   Generalized osteoarthritis    GERD (gastroesophageal reflux disease)    HLD (hyperlipidemia)    Hypertension    Hypothyroidism    LBBB (left bundle branch block)    PAF (paroxysmal atrial fibrillation) ([place])    a. s/p DCCV 06/2017, on Xarelto   Phlebitis    S/P TAVR (transcatheter aortic valve replacement) 04/15/2018   Edwards Sapien 3 THV (size 23 mm, model # 9600TFX, serial # [id number]) via the TF approach    Severe aortic stenosis    a. s/p TAVR 04/2018.   Stroke [facility]) 2008    Family History  Problem Relation Age of Onset   Stroke Mother    Hypertension Mother    Hypertension Father    Heart attack Neg Hx     Past Surgical History:  Procedure Laterality Date   ABDOMINAL HYSTERECTOMY     BIOPSY  05/19/2019   Procedure: BIOPSY;  Surgeon: Ronald Lobo, MD;  Location: [facility] ENDOSCOPY;  Service: Endoscopy;;   BREAST BIOPSY Left    years ago at "a doctor's office"   COLONOSCOPY     COLONOSCOPY WITH PROPOFOL N/A 10/02/2018   Procedure: COLONOSCOPY WITH PROPOFOL;  Surgeon: Arta Silence, MD;  Location: [place];  Service: Endoscopy;  Laterality: N/A;   ENTEROSCOPY N/A 05/19/2019   Procedure: ENTEROSCOPY;  Surgeon: Ronald Lobo, MD;  Location: [place];  Service: Endoscopy;  Laterality: N/A;   ESOPHAGOGASTRODUODENOSCOPY (EGD) WITH PROPOFOL N/A 10/02/2018   Procedure: ESOPHAGOGASTRODUODENOSCOPY (EGD) WITH PROPOFOL;  Surgeon: Arta Silence, MD;  Location: [place];  Service: Endoscopy;  Laterality: N/A;   ESOPHAGOGASTRODUODENOSCOPY (EGD) WITH PROPOFOL N/A 04/23/2019  Procedure: ESOPHAGOGASTRODUODENOSCOPY (EGD) WITH PROPOFOL;  Surgeon: Wonda Horner, MD;  Location: [facility] ENDOSCOPY;  Service: Endoscopy;  Laterality: N/A;   EYE SURGERY Bilateral    cataract removal   GIVENS CAPSULE STUDY N/A 10/02/2018   Procedure: GIVENS CAPSULE STUDY;  Surgeon: Arta Silence, MD;  Location: [facility] ENDOSCOPY;  Service: Endoscopy;  Laterality: N/A;   RIGHT/LEFT HEART CATH AND CORONARY ANGIOGRAPHY N/A 03/06/2018   Procedure: RIGHT/LEFT HEART CATH AND CORONARY ANGIOGRAPHY;  Surgeon: Belva Crome, MD;  Location: [place] CV LAB;  Service: Cardiovascular;  Laterality: N/A;   SMALL BOWEL ENTEROSCOPY  05/19/2019   TEE WITHOUT CARDIOVERSION N/A 04/01/2018   Procedure: TRANSESOPHAGEAL ECHOCARDIOGRAM (TEE);  Surgeon: Jerline Pain, MD;  Location: [facility] ENDOSCOPY;  Service: Cardiovascular;  Laterality: N/A;    TEE WITHOUT CARDIOVERSION N/A 04/15/2018   Procedure: TRANSESOPHAGEAL ECHOCARDIOGRAM (TEE);  Surgeon: Sherren Mocha, MD;  Location: [place];  Service: Open Heart Surgery;  Laterality: N/A;   TOTAL KNEE ARTHROPLASTY Right 04/14/2013   Dr Marlou Sa   TOTAL KNEE ARTHROPLASTY Right 04/14/2013   Procedure: TOTAL KNEE ARTHROPLASTY;  Surgeon: Meredith Pel, MD;  Location: [place];  Service: Orthopedics;  Laterality: Right;   TRANSCATHETER AORTIC VALVE REPLACEMENT, TRANSFEMORAL  04/15/2018   TRANSCATHETER AORTIC VALVE REPLACEMENT, TRANSFEMORAL Bilateral 04/15/2018   Procedure: TRANSCATHETER AORTIC VALVE REPLACEMENT, TRANSFEMORAL;  Surgeon: [place] Mocha, MD;  Location: [place];  Service: Open Heart Surgery;  Laterality: Bilateral;   Social History   Occupational History   Not on file  Tobacco Use   Smoking status: Never   Smokeless tobacco: Never  Vaping Use   Vaping Use: Never used  Substance and Sexual Activity   Alcohol use: No   Drug use: No   Sexual activity: Not Currently

## 2022-11-30 ENCOUNTER — Ambulatory Visit
Admission: RE | Admit: 2022-11-30 | Discharge: 2022-11-30 | Disposition: A | Discharge status: Home / Self Care | Payer: Medicare HMO | Source: Ambulatory Visit | Attending: Family Medicine | Admitting: Family Medicine

## 2022-11-30 DIAGNOSIS — Z1231 Encounter for screening mammogram for malignant neoplasm of breast: Secondary | ICD-10-CM

## 2022-12-25 ENCOUNTER — Other Ambulatory Visit (INDEPENDENT_AMBULATORY_CARE_PROVIDER_SITE_OTHER): Payer: Medicare HMO

## 2022-12-25 ENCOUNTER — Ambulatory Visit (INDEPENDENT_AMBULATORY_CARE_PROVIDER_SITE_OTHER): Payer: Medicare HMO | Admitting: Surgical

## 2022-12-25 ENCOUNTER — Encounter: Payer: Self-pay | Admitting: Surgical

## 2022-12-25 DIAGNOSIS — M1712 Unilateral primary osteoarthritis, left knee: Secondary | ICD-10-CM | POA: Diagnosis not present

## 2022-12-25 MED ORDER — METHYLPREDNISOLONE ACETATE 40 MG/ML IJ SUSP
40.0000 mg | INTRAMUSCULAR | Status: AC | PRN
  Administered 2022-12-25: 40 mg via INTRA_ARTICULAR

## 2022-12-25 MED ORDER — BUPIVACAINE HCL 0.25 % IJ SOLN
4.0000 mL | INTRAMUSCULAR | Status: AC | PRN
  Administered 2022-12-25: 4 mL via INTRA_ARTICULAR

## 2022-12-25 MED ORDER — LIDOCAINE HCL 1 % IJ SOLN
5.0000 mL | INTRAMUSCULAR | Status: AC | PRN
  Administered 2022-12-25: 5 mL

## 2022-12-25 NOTE — Progress Notes (Signed)
Office Visit Note   Patient: Janet Williamson           Date of Birth: [birth year]           MRN: [id number] Visit Date: 12/25/2022 Requested by: Kelton Pillar, MD [address] [place],  [place] [address] PCP: Kelton Pillar, MD  Subjective: Chief Complaint  Patient presents with   Left Knee - Pain    HPI: Janet Williamson is a 82 y.o. female who presents to the office reporting left knee pain with locking episode.  Patient states that she had locking episode about 8 days ago where her knee locked for about 15 minutes and she had to manipulate it in order to unlock it.  This is never happened before or since.  Since then, she has had increased pain in the left knee primarily in the medial compartment where most of her knee arthritis is.  She has tried a knee sleeve and Tylenol without much relief.  No groin pain or radicular pain..                ROS: All systems reviewed are negative as they relate to the chief complaint within the history of present illness.  Patient denies fevers or chills.  Assessment & Plan: Visit Diagnoses:  1. Arthritis of left knee     Plan: Patient is a 82 year old female who presents for evaluation of left knee pain.  She had recent cortisone injection little over a month ago but has new locking episode about 8 days ago that has flared her pain up.  She has new radiographs taken today demonstrating no loose body.  Plan is to administer another cortisone injection even though it is fairly close to her last injection, just based on the amount of increased pain she has.  She has no fevers or chills.  Will see how this does for her and I recommended that she let us know if she has any other locking episodes.  If she has recurrent locking, we could consider MRI to evaluate for loose body that does not show up on x-ray if she would like to consider excision of such a loose body.  Otherwise, follow-up as needed.  Follow-Up Instructions: No follow-ups  on file.   Orders:  Orders Placed This Encounter  Procedures   XR KNEE 3 VIEW LEFT   No orders of the defined types were placed in this encounter.     Procedures: Large Joint Inj: L knee on 12/25/2022 1:04 PM Indications: diagnostic evaluation, joint swelling and pain Details: 18 G 1.5 in needle, superolateral approach  Arthrogram: No  Medications: 5 mL lidocaine 1 %; 40 mg methylPREDNISolone acetate 40 MG/ML; 4 mL bupivacaine 0.25 % Aspirate: 5 mL Outcome: tolerated well, no immediate complications Procedure, treatment alternatives, risks and benefits explained, specific risks discussed. Consent was given by the patient. Immediately prior to procedure a time out was called to verify the correct patient, procedure, equipment, support staff and site/side marked as required. Patient was prepped and draped in the usual sterile fashion.       Clinical Data: No additional findings.  Objective: Vital Signs: There were no vitals taken for this visit.  Physical Exam:  Constitutional: Patient appears well-developed HEENT:  Head: Normocephalic Eyes:EOM are normal Neck: Normal range of motion Cardiovascular: Normal rate Pulmonary/chest: Effort normal Neurologic: Patient is alert Skin: Skin is warm Psychiatric: Patient has normal mood and affect  Ortho Exam: Ortho exam demonstrates left knee  with trace effusion.  Tenderness over the medial joint line.  No mechanical locking that is felt with passive motion of the knee today.  She has no tenderness over the lateral joint line.  No pain with hip range of motion.  Able to perform straight leg raise without extensor lag.  No calf tenderness.  Negative Homans' sign.  Intact ankle dorsiflexion and plantarflexion.  No cellulitis or skin changes noted.    Specialty Comments:  MRI LUMBAR SPINE WITHOUT CONTRAST   TECHNIQUE: Multiplanar, multisequence MR imaging of the lumbar spine was performed. No intravenous contrast was  administered.   COMPARISON:  Lumbar spine radiographs 12/26/2019   FINDINGS: Segmentation: 5 non rib-bearing lumbar type vertebral bodies are present. The lowest fully formed vertebral body is L5.   Alignment: No significant listhesis is present. Mild leftward curvature is centered at L3-4.   Vertebrae:  Marrow signal and vertebral body heights are normal.   Conus medullaris and cauda equina: Conus extends to the L1 level. Conus and cauda equina appear normal.   Paraspinal and other soft tissues: Limited imaging the abdomen is unremarkable. There is no significant adenopathy. No solid organ lesions are present.   Disc levels:   T12-L1: Negative.   L1-2: Negative.   L2-3: Negative.   L3-4: A leftward disc protrusion is present. Mild left subarticular narrowing is present. Foramina are patent bilaterally.   L4-5: A broad-based disc protrusion is present. Moderate facet hypertrophy and ligamentum flavum thickening is noted. Mild central canal stenosis is present with subarticular narrowing bilaterally. Mild to moderate foraminal narrowing is worse right than left.   L5-S1: A broad-based disc protrusion is present. Moderate facet hypertrophy and ligamentum flavum thickening is noted. Mild subarticular and foraminal narrowing is present bilaterally.   IMPRESSION: 1. Broad-based disc protrusions and facet hypertrophy at L4-5 greater than L5-S1. 2. Mild central canal stenosis with subarticular narrowing bilaterally at L4-5. 3. Mild to moderate foraminal narrowing bilaterally at L4-5 is worse right than left. 4. Mild subarticular and foraminal narrowing bilaterally at L5-S1. 5. Mild left subarticular narrowing at L3-4.     Electronically Signed   By: San Morelle M.D.   On: 07/02/2022 05:49  Imaging: No results found.   PMFS History: Patient Active Problem List   Diagnosis Date Noted   GI bleed 04/21/2019   Symptomatic anemia 09/30/2018   S/P TAVR  (transcatheter aortic valve replacement) 04/15/2018   Severe aortic stenosis    PAF (paroxysmal atrial fibrillation) ([place]) 03/04/2018   History of stroke 11/03/2014   Hypertension    DM (diabetes mellitus) ([place]) 05/06/2013   HTN (hypertension) 05/06/2013   Anemia 05/06/2013   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 Not seen on most recent TEE 2019.   Cardiomyopathy in other disease    Cataracts, bilateral    Diabetes mellitus    Type 2   Generalized osteoarthritis    GERD (gastroesophageal reflux disease)    HLD (hyperlipidemia)    Hypertension    Hypothyroidism    LBBB (left bundle branch block)    PAF (paroxysmal atrial fibrillation) ([place])    a. s/p DCCV 06/2017, on Xarelto   Phlebitis    S/P TAVR (transcatheter aortic valve replacement) 04/15/2018   Edwards Sapien 3 THV (size 23 mm, model # 9600TFX, serial # [id number]) via the TF approach   Severe aortic stenosis  a. s/p TAVR 04/2018.   Stroke [facility]) 2008    Family History  Problem Relation Age of Onset   Stroke Mother    Hypertension Mother    Hypertension Father    Heart attack Neg Hx     Past Surgical History:  Procedure Laterality Date   ABDOMINAL HYSTERECTOMY     BIOPSY  05/19/2019   Procedure: BIOPSY;  Surgeon: Ronald Lobo, MD;  Location: [facility] ENDOSCOPY;  Service: Endoscopy;;   BREAST BIOPSY Left    years ago at "a doctor's office"   COLONOSCOPY     COLONOSCOPY WITH PROPOFOL N/A 10/02/2018   Procedure: COLONOSCOPY WITH PROPOFOL;  Surgeon: Arta Silence, MD;  Location: [place];  Service: Endoscopy;  Laterality: N/A;   ENTEROSCOPY N/A 05/19/2019   Procedure: ENTEROSCOPY;  Surgeon: Ronald Lobo, MD;  Location: [place];  Service: Endoscopy;  Laterality: N/A;   ESOPHAGOGASTRODUODENOSCOPY (EGD) WITH PROPOFOL N/A 10/02/2018   Procedure: ESOPHAGOGASTRODUODENOSCOPY (EGD) WITH PROPOFOL;  Surgeon:  Arta Silence, MD;  Location: [place];  Service: Endoscopy;  Laterality: N/A;   ESOPHAGOGASTRODUODENOSCOPY (EGD) WITH PROPOFOL N/A 04/23/2019   Procedure: ESOPHAGOGASTRODUODENOSCOPY (EGD) WITH PROPOFOL;  Surgeon: Wonda Horner, MD;  Location: [facility] ENDOSCOPY;  Service: Endoscopy;  Laterality: N/A;   EYE SURGERY Bilateral    cataract removal   GIVENS CAPSULE STUDY N/A 10/02/2018   Procedure: GIVENS CAPSULE STUDY;  Surgeon: Arta Silence, MD;  Location: [facility] ENDOSCOPY;  Service: Endoscopy;  Laterality: N/A;   RIGHT/LEFT HEART CATH AND CORONARY ANGIOGRAPHY N/A 03/06/2018   Procedure: RIGHT/LEFT HEART CATH AND CORONARY ANGIOGRAPHY;  Surgeon: Belva Crome, MD;  Location: [place] CV LAB;  Service: Cardiovascular;  Laterality: N/A;   SMALL BOWEL ENTEROSCOPY  05/19/2019   TEE WITHOUT CARDIOVERSION N/A 04/01/2018   Procedure: TRANSESOPHAGEAL ECHOCARDIOGRAM (TEE);  Surgeon: Jerline Pain, MD;  Location: [facility] ENDOSCOPY;  Service: Cardiovascular;  Laterality: N/A;   TEE WITHOUT CARDIOVERSION N/A 04/15/2018   Procedure: TRANSESOPHAGEAL ECHOCARDIOGRAM (TEE);  Surgeon: Sherren Mocha, MD;  Location: [place];  Service: Open Heart Surgery;  Laterality: N/A;   TOTAL KNEE ARTHROPLASTY Right 04/14/2013   Dr Marlou Sa   TOTAL KNEE ARTHROPLASTY Right 04/14/2013   Procedure: TOTAL KNEE ARTHROPLASTY;  Surgeon: Meredith Pel, MD;  Location: [place];  Service: Orthopedics;  Laterality: Right;   TRANSCATHETER AORTIC VALVE REPLACEMENT, TRANSFEMORAL  04/15/2018   TRANSCATHETER AORTIC VALVE REPLACEMENT, TRANSFEMORAL Bilateral 04/15/2018   Procedure: TRANSCATHETER AORTIC VALVE REPLACEMENT, TRANSFEMORAL;  Surgeon: Sherren Mocha, MD;  Location: [place];  Service: Open Heart Surgery;  Laterality: Bilateral;   Social History   Occupational History   Not on file  Tobacco Use   Smoking status: Never   Smokeless tobacco: Never  Vaping Use   Vaping Use: Never used  Substance and Sexual Activity   Alcohol use: No   Drug use:  No   Sexual activity: Not Currently

## 2022-12-31 DIAGNOSIS — Z0100 Encounter for examination of eyes and vision without abnormal findings: Secondary | ICD-10-CM | POA: Diagnosis not present

## 2022-12-31 DIAGNOSIS — E119 Type 2 diabetes mellitus without complications: Secondary | ICD-10-CM | POA: Diagnosis not present

## 2023-01-15 ENCOUNTER — Other Ambulatory Visit (INDEPENDENT_AMBULATORY_CARE_PROVIDER_SITE_OTHER): Payer: Medicare HMO

## 2023-01-15 DIAGNOSIS — E119 Type 2 diabetes mellitus without complications: Secondary | ICD-10-CM

## 2023-01-15 DIAGNOSIS — E042 Nontoxic multinodular goiter: Secondary | ICD-10-CM

## 2023-01-15 DIAGNOSIS — E7800 Pure hypercholesterolemia, unspecified: Secondary | ICD-10-CM | POA: Diagnosis not present

## 2023-01-15 LAB — LIPID PANEL
Cholesterol: 152 mg/dL (ref 0–200)
HDL: 47.3 mg/dL (ref >39.00)
LDL Cholesterol: 92 mg/dL (ref 0–99)
NonHDL: 104.94
Total CHOL/HDL Ratio: 3
Triglycerides: 63 mg/dL (ref 0.0–149.0)
VLDL: 12.6 mg/dL (ref 0.0–40.0)

## 2023-01-15 LAB — T4, FREE: Free T4: 0.77 ng/dL (ref 0.60–1.60)

## 2023-01-15 LAB — COMPREHENSIVE METABOLIC PANEL
ALT: 9 U/L (ref 0–35)
AST: 16 U/L (ref 0–37)
Albumin: 4 g/dL (ref 3.5–5.2)
Alkaline Phosphatase: 66 U/L (ref 39–117)
BUN: 16 mg/dL (ref 6–23)
CO2: 31 mEq/L (ref 19–32)
Calcium: 10.4 mg/dL (ref 8.4–10.5)
Chloride: 98 mEq/L (ref 96–112)
Creatinine, Ser: 0.78 mg/dL (ref 0.40–1.20)
GFR: 70.9 mL/min (ref >60.00)
Glucose, Bld: 108 mg/dL — ABNORMAL HIGH (ref 70–99)
Potassium: 3.9 mEq/L (ref 3.5–5.1)
Sodium: 138 mEq/L (ref 135–145)
Total Bilirubin: 0.3 mg/dL (ref 0.2–1.2)
Total Protein: 8 g/dL (ref 6.0–8.3)

## 2023-01-15 LAB — MICROALBUMIN / CREATININE URINE RATIO
Creatinine,U: 17.7 mg/dL
Microalb Creat Ratio: 4 mg/g (ref 0.0–30.0)
Microalb, Ur: <0.7 mg/dL (ref 0.0–1.9)

## 2023-01-15 LAB — HEMOGLOBIN A1C: Hgb A1c MFr Bld: 6.8 % — ABNORMAL HIGH (ref 4.6–6.5)

## 2023-01-15 LAB — TSH: TSH: 3.09 u[IU]/mL (ref 0.35–5.50)

## 2023-01-17 ENCOUNTER — Ambulatory Visit (INDEPENDENT_AMBULATORY_CARE_PROVIDER_SITE_OTHER): Payer: Medicare HMO | Admitting: Endocrinology

## 2023-01-17 ENCOUNTER — Encounter: Payer: Self-pay | Admitting: Endocrinology

## 2023-01-17 VITALS — BP 124/72 | HR 77 | Ht 63.75 in | Wt 187.0 lb

## 2023-01-17 DIAGNOSIS — E042 Nontoxic multinodular goiter: Secondary | ICD-10-CM | POA: Diagnosis not present

## 2023-01-17 DIAGNOSIS — E119 Type 2 diabetes mellitus without complications: Secondary | ICD-10-CM | POA: Diagnosis not present

## 2023-01-17 DIAGNOSIS — E7800 Pure hypercholesterolemia, unspecified: Secondary | ICD-10-CM

## 2023-01-17 DIAGNOSIS — I10 Essential (primary) hypertension: Secondary | ICD-10-CM

## 2023-01-17 NOTE — Patient Instructions (Signed)
Check blood sugars on waking up 2-3 days a week  Also check blood sugars about 2 hours after meals and do this after different meals by rotation  Recommended blood sugar levels on waking up are 90-130 and about 2 hours after meal is 130-160  Please bring your blood sugar monitor to each visit, thank you   

## 2023-01-17 NOTE — Progress Notes (Signed)
Patient ID: Janet Williamson, female   DOB: 1941-04-14, 82 y.o.   MRN: [id number]   Reason for Appointment: follow-up of various problems  History of Present Illness   Type 2 DIABETES MELITUS, date of diagnosis 02/2012     She has had mild diabetes which has been well controlled with metformin ER alone. Has had no side effects from metformin  She also has had diabetes education in 2013 . Oral hypoglycemic drugs: Metformin ER using 750 mg tablets, 1.5 g daily.       Side effects from medications: None  Her A1c is about the same at 6.8  Fructosamine last 250  Current management, blood sugar patterns and problems identified:   She has been taking metformin 1500 mg daily as before No diarrhea or abdominal discomfort with this The time on her monitor was incorrect and still has only a few readings Her test trips were checked and these are not outdated Blood sugars at home are running about 120-150 recently but previously as low as 93 She thinks she is watching her diet fairly well Her weight is slightly less than before Not able to do any exercise because of musculoskeletal pain and difficulty ambulating   Blood sugar readings from his download of ultra 2 meter  AVERAGE overall 129  Average previously. 126 FASTING average 128 Range 102-169 with highest reading midday and no readings after dinner   Previous visits with dietitian: 4/22     Wt Readings from Last 3 Encounters:  01/17/23 187 lb (84.8 kg)  07/13/22 192 lb (87.1 kg)  12/21/21 189 lb 3.2 oz (85.8 kg)     Lab Results  Component Value Date   HGBA1C 6.8 (H) 01/15/2023   HGBA1C 6.9 (H) 07/12/2022   HGBA1C 7.0 (H) 12/19/2021   Lab Results  Component Value Date   MICROALBUR <0.7 01/15/2023   LDLCALC 92 01/15/2023   CREATININE 0.78 01/15/2023     Other active problems evaluated today: See review of systems  LABS:  Lab on 01/15/2023  Component Date Value Ref Range Status   Free T4 01/15/2023  0.77  0.60 - 1.60 ng/dL Final   Comment: Specimens from patients who are undergoing biotin therapy and /or ingesting biotin supplements may contain high levels of biotin.  The higher biotin concentration in these specimens interferes with this Free T4 assay.  Specimens that contain high levels  of biotin may cause false high results for this Free T4 assay.  Please interpret results in light of the total clinical presentation of the patient.     TSH 01/15/2023 3.09  0.35 - 5.50 uIU/mL Final   Cholesterol 01/15/2023 152  0 - 200 mg/dL Final   ATP III Classification       Desirable:  < 200 mg/dL               Borderline High:  200 - 239 mg/dL          High:  > = 409 mg/dL   Triglycerides 81/19/1478 63.0  0.0 - 149.0 mg/dL Final   Normal:  <295 mg/dLBorderline High:  150 - 199 mg/dL   HDL 62/13/0865 78.46  >39.00 mg/dL Final   VLDL 96/29/5284 12.6  0.0 - 40.0 mg/dL Final   LDL Cholesterol 01/15/2023 92  0 - 99 mg/dL Final   Total CHOL/HDL Ratio 01/15/2023 3   Final                  Men  Women1/2 Average Risk     3.4          3.3Average Risk          5.0          4.42X Average Risk          9.6          7.13X Average Risk          15.0          11.0                       NonHDL 01/15/2023 104.94   Final   NOTE:  Non-HDL goal should be 30 mg/dL higher than patient's LDL goal (i.e. LDL goal of < 70 mg/dL, would have non-HDL goal of < 100 mg/dL)   Microalb, Ur 43/53/9122 <0.7  0.0 - 1.9 mg/dL Final   Creatinine,U 58/34/6219 17.7  mg/dL Final   Microalb Creat Ratio 01/15/2023 4.0  0.0 - 30.0 mg/g Final   Sodium 01/15/2023 138  135 - 145 mEq/L Final   Potassium 01/15/2023 3.9  3.5 - 5.1 mEq/L Final   Chloride 01/15/2023 98  96 - 112 mEq/L Final   CO2 01/15/2023 31  19 - 32 mEq/L Final   Glucose, Bld 01/15/2023 108 (H)  70 - 99 mg/dL Final   BUN 47/09/5270 16  6 - 23 mg/dL Final   Creatinine, Ser 01/15/2023 0.78  0.40 - 1.20 mg/dL Final   Total Bilirubin 01/15/2023 0.3  0.2 - 1.2 mg/dL Final    Alkaline Phosphatase 01/15/2023 66  39 - 117 U/L Final   AST 01/15/2023 16  0 - 37 U/L Final   ALT 01/15/2023 9  0 - 35 U/L Final   Total Protein 01/15/2023 8.0  6.0 - 8.3 g/dL Final   Albumin 29/29/0903 4.0  3.5 - 5.2 g/dL Final   GFR 01/49/9692 70.90  >60.00 mL/min Final   Calculated using the CKD-EPI Creatinine Equation (2021)   Calcium 01/15/2023 10.4  8.4 - 10.5 mg/dL Final   Hgb S9J MFr Bld 01/15/2023 6.8 (H)  4.6 - 6.5 % Final   Glycemic Control Guidelines for People with Diabetes:Non Diabetic:  <6%Goal of Therapy: <7%Additional Action Suggested:  >8%     Allergies as of 01/17/2023       Reactions   Ace Inhibitors Swelling, Other (See Comments)   Angioedema   Keflex [cephalexin] Swelling, Other (See Comments)   Lips became swollwn   Rifadin [rifampin] Swelling, Other (See Comments)   Lips became swollen        Medication List        Accurate as of January 17, 2023 10:04 AM. If you have any questions, ask your nurse or doctor.          STOP taking these medications    diclofenac 1.3 % Ptch Commonly known as: FLECTOR Stopped by: Reather Littler, MD   doxycycline 100 MG capsule Commonly known as: VIBRAMYCIN Stopped by: Reather Littler, MD       TAKE these medications    acetaminophen 325 MG tablet Commonly known as: TYLENOL Take 325-650 mg by mouth every 8 (eight) hours as needed (for headaches).   amLODipine 5 MG tablet Commonly known as: NORVASC Take 5 mg by mouth daily.   hydrochlorothiazide 25 MG tablet Commonly known as: HYDRODIURIL Take 25 mg by mouth daily.   hydrOXYzine 25 MG tablet Commonly known as: ATARAX Take 25 mg by mouth 3 (three) times  daily as needed.   metFORMIN 750 MG 24 hr tablet Commonly known as: GLUCOPHAGE-XR TAKE 2 TABLETS BY MOUTH ONCE DAILY WITH BREAKFAST   metoprolol succinate 50 MG 24 hr tablet Commonly known as: TOPROL-XL Take 50 mg by mouth daily.   Accu-Chek Guide Me w/Device Kit by Does not apply route.   ONE TOUCH  ULTRA 2 w/Device Kit Use to check blood sugars three times daily DX: E11.9   OneTouch Delica Plus Lancet33G Misc TEST BLOOD SUGAR THREE TIMES DAILY   OneTouch Ultra test strip Generic drug: glucose blood TEST BLOOD SUGAR THREE TIMES DAILY   simvastatin 20 MG tablet Commonly known as: ZOCOR Take 20 mg by mouth every evening.   SYSTANE OP Place 1 drop into both eyes 2 (two) times daily.   Vitamin D3 125 MCG (5000 UT) capsule Generic drug: Cholecalciferol Take 5,000 Units by mouth daily.        Allergies:  Allergies  Allergen Reactions   Ace Inhibitors Swelling and Other (See Comments)    Angioedema    Keflex [Cephalexin] Swelling and Other (See Comments)    Lips became swollwn   Rifadin [Rifampin] Swelling and Other (See Comments)    Lips became swollen    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 Not seen on most recent TEE 2019.   Cardiomyopathy in other disease    Cataracts, bilateral    Diabetes mellitus    Type 2   Generalized osteoarthritis    GERD (gastroesophageal reflux disease)    HLD (hyperlipidemia)    Hypertension    Hypothyroidism    LBBB (left bundle branch block)    PAF (paroxysmal atrial fibrillation)    a. s/p DCCV 06/2017, on Xarelto   Phlebitis    S/P TAVR (transcatheter aortic valve replacement) 04/15/2018   Edwards Sapien 3 THV (size 23 mm, model # 9600TFX, serial # [id number]) via the TF approach   Severe aortic stenosis    a. s/p TAVR 04/2018.   Stroke 2008    Past Surgical History:  Procedure Laterality Date   ABDOMINAL HYSTERECTOMY     BIOPSY  05/19/2019   Procedure: BIOPSY;  Surgeon: Bernette RedbirdBuccini, Robert, MD;  Location: [facility];  Service: Endoscopy;;   BREAST BIOPSY Left    years ago at "a doctor's office"   COLONOSCOPY     COLONOSCOPY WITH PROPOFOL N/A 10/02/2018   Procedure: COLONOSCOPY WITH PROPOFOL;  Surgeon: Willis Modenautlaw,  William, MD;  [facility];  Service: Endoscopy;  Laterality: N/A;   ENTEROSCOPY N/A 05/19/2019   Procedure: ENTEROSCOPY;  Surgeon: Bernette RedbirdBuccini, Robert, MD;  [facility];  Service: Endoscopy;  Laterality: N/A;   ESOPHAGOGASTRODUODENOSCOPY (EGD) WITH PROPOFOL N/A 10/02/2018   Procedure: ESOPHAGOGASTRODUODENOSCOPY (EGD) WITH PROPOFOL;  Surgeon: Willis Modenautlaw, William, MD;  [facility];  Service: Endoscopy;  Laterality: N/A;   ESOPHAGOGASTRODUODENOSCOPY (EGD) WITH PROPOFOL N/A 04/23/2019   Procedure: ESOPHAGOGASTRODUODENOSCOPY (EGD) WITH PROPOFOL;  Surgeon: Graylin ShiverGanem, Salem F, MD;  [facility];  Service: Endoscopy;  Laterality: N/A;   EYE SURGERY Bilateral    cataract removal   GIVENS CAPSULE STUDY N/A 10/02/2018   Procedure: GIVENS CAPSULE STUDY;  Surgeon: Willis Modenautlaw, William, MD;  [facility];  Service: Endoscopy;  Laterality: N/A;   RIGHT/LEFT HEART CATH AND CORONARY ANGIOGRAPHY N/A 03/06/2018   Procedure: RIGHT/LEFT HEART CATH AND CORONARY ANGIOGRAPHY;  Surgeon: Lyn RecordsSmith, Henry W, MD;  Location: [facility] INVASIVE CV LAB;  Service: Cardiovascular;  Laterality: N/A;   SMALL BOWEL ENTEROSCOPY  05/19/2019   TEE WITHOUT CARDIOVERSION N/A 04/01/2018   Procedure: TRANSESOPHAGEAL ECHOCARDIOGRAM (TEE);  Surgeon: Jake Bathe, MD;  Location[facility] ENDOSCOPY;  Service: Cardiovascular;  Laterality: N/A;   TEE WITHOUT CARDIOVERSION N/A 04/15/2018   Procedure: TRANSESOPHAGEAL ECHOCARDIOGRAM (TEE);  Surgeon: Tonny Bollman, MD;  Location: [facility] OR;  Service: Open Heart Surgery;  Laterality: N/A;   TOTAL KNEE ARTHROPLASTY Right 04/14/2013   Dr August Saucer   TOTAL KNEE ARTHROPLASTY Right 04/14/2013   Procedure: TOTAL KNEE ARTHROPLASTY;  Surgeon: Cammy Copa, MD;  Location: [facility] OR;  Service: Orthopedics;  Laterality: Right;   TRANSCATHETER AORTIC VALVE REPLACEMENT, TRANSFEMORAL  04/15/2018   TRANSCATHETER AORTIC VALVE REPLACEMENT, TRANSFEMORAL Bilateral 04/15/2018   Procedure: TRANSCATHETER AORTIC  VALVE REPLACEMENT, TRANSFEMORAL;  Surgeon: Tonny Bollman, MD;  Location[facility] OR;  Service: Open Heart Surgery;  Laterality: Bilateral;    Family History  Problem Relation Age of Onset   Stroke Mother    Hypertension Mother    Hypertension Father    Heart attack Neg Hx     Social History:  reports that she has never smoked. She has never used smokeless tobacco. She reports that she does not drink alcohol and does not use drugs.  Review of Systems:  HYPERTENSION:  she has been treated with 5 mg amlodipine, 50 mg metoprolol and hydrochlorothiazide 25 mg, followed by PCP. She had swelling of the lips with ramipril and has not been prescribed an ARB drug  BP Readings from Last 3 Encounters:  01/17/23 124/72  08/02/22 138/77  07/19/22 (!) 145/77    HYPERLIPIDEMIA: The lipid abnormality consists of elevated LDL treated with simvastatin 20 mg, all parameters controlled    LDL as follows:   Lab Results  Component Value Date   CHOL 152 01/15/2023   HDL 47.30 01/15/2023   LDLCALC 92 01/15/2023   TRIG 63.0 01/15/2023   CHOLHDL 3 01/15/2023    Multinodular goiter:  She has had a long-standing multinodular goiter, last ultrasound was done in 05/2012 which showed the largest nodule to be 4.1 cm nodule in the isthmus  She only has occasional difficulty with feeling of choking in her throat but no difficulty swallowing, this is not any worse  Findings on last exam: There is a 3.5 cm midline thyroid nodule which is relatively soft Right thyroid lobe is rubbery and about 2-2.5 times normal, left side is smaller  TSH has been normal consistently  Lab Results  Component Value Date   TSH 3.09 01/15/2023     HYPERCALCEMIA: has had occasional high levels just above normal Recently again has upper normal readings She does take 25 mg HCTZ PTH low normal previously   Lab Results  Component Value Date   CALCIUM 10.4 01/15/2023   CALCIUM 10.3 07/12/2022   CALCIUM 10.2 12/19/2021    CALCIUM 10.2 06/20/2021   CALCIUM 10.3 12/07/2020    Lab Results  Component Value Date   PTH 23 08/22/2017   CALCIUM 10.4 01/15/2023   CAION 1.14 (L) 04/15/2018     Examination: BP 124/72 (BP Location: Left Arm, Patient Position: Sitting, Cuff Size: Large)   Pulse 77   Ht 5' 3.75" (1.619 m)   Wt 187 lb (84.8 kg)   SpO2 99%   BMI 32.35 kg/m   Body mass index is 32.35 kg/m.      ASSESSMENT/ PLAN:   Diabetes type 2 with obesity  See  history of present illness for discussion of current diabetes management, blood sugar patterns and problems identified  She is on Metformin 1500 mg long-term  A1c is consistently just under 7%  She has fairly good blood sugars at although checked somewhat infrequently Difficult to assess any patterns as her monitor her at the wrong time but most of the blood sugars are in the low 100 range Considering her age her blood sugar control is excellent Weight is slightly less As before not able to exercise much  She will continue metformin monotherapy, 1500 mg as before The time on her monitor was set to the correct time Discussed when to check her blood sugars and blood sugar targets  Microalbumin normal  Multinodular goiter: TSH is normal again  LIPIDS: Well-controlled with simvastatin 20 mg which she will continue  Upper normal calcium levels without hyperparathyroidism, stable and may be related to HCTZ  Labs: Reviewed and Forwarded to PCP also  There are no Patient Instructions on file for this visit.   Reather Littler 01/17/2023, 10:04 AM   Note: This office note was prepared with Dragon voice recognition system technology. Any transcriptional errors that result from this process are unintentional.

## 2023-01-27 ENCOUNTER — Other Ambulatory Visit: Payer: Self-pay | Admitting: Endocrinology

## 2023-01-27 DIAGNOSIS — E1165 Type 2 diabetes mellitus with hyperglycemia: Secondary | ICD-10-CM

## 2023-03-13 DIAGNOSIS — M8580 Other specified disorders of bone density and structure, unspecified site: Secondary | ICD-10-CM | POA: Diagnosis not present

## 2023-03-13 DIAGNOSIS — D6869 Other thrombophilia: Secondary | ICD-10-CM | POA: Diagnosis not present

## 2023-03-13 DIAGNOSIS — D500 Iron deficiency anemia secondary to blood loss (chronic): Secondary | ICD-10-CM | POA: Diagnosis not present

## 2023-03-13 DIAGNOSIS — E118 Type 2 diabetes mellitus with unspecified complications: Secondary | ICD-10-CM | POA: Diagnosis not present

## 2023-03-13 DIAGNOSIS — M1990 Unspecified osteoarthritis, unspecified site: Secondary | ICD-10-CM | POA: Diagnosis not present

## 2023-03-13 DIAGNOSIS — I639 Cerebral infarction, unspecified: Secondary | ICD-10-CM | POA: Diagnosis not present

## 2023-03-13 DIAGNOSIS — I350 Nonrheumatic aortic (valve) stenosis: Secondary | ICD-10-CM | POA: Diagnosis not present

## 2023-03-13 DIAGNOSIS — E785 Hyperlipidemia, unspecified: Secondary | ICD-10-CM | POA: Diagnosis not present

## 2023-03-13 DIAGNOSIS — I10 Essential (primary) hypertension: Secondary | ICD-10-CM | POA: Diagnosis not present

## 2023-03-28 ENCOUNTER — Ambulatory Visit
Admission: RE | Admit: 2023-03-28 | Discharge: 2023-03-28 | Disposition: A | Discharge status: Home / Self Care | Payer: Medicare HMO | Source: Ambulatory Visit | Attending: Family Medicine | Admitting: Family Medicine

## 2023-03-28 DIAGNOSIS — Z90722 Acquired absence of ovaries, bilateral: Secondary | ICD-10-CM | POA: Diagnosis not present

## 2023-03-28 DIAGNOSIS — N958 Other specified menopausal and perimenopausal disorders: Secondary | ICD-10-CM | POA: Diagnosis not present

## 2023-03-28 DIAGNOSIS — M8589 Other specified disorders of bone density and structure, multiple sites: Secondary | ICD-10-CM

## 2023-03-28 DIAGNOSIS — M810 Age-related osteoporosis without current pathological fracture: Secondary | ICD-10-CM | POA: Diagnosis not present

## 2023-03-28 DIAGNOSIS — E349 Endocrine disorder, unspecified: Secondary | ICD-10-CM | POA: Diagnosis not present

## 2023-04-11 ENCOUNTER — Other Ambulatory Visit: Payer: Self-pay | Admitting: Endocrinology

## 2023-04-11 DIAGNOSIS — E119 Type 2 diabetes mellitus without complications: Secondary | ICD-10-CM

## 2023-04-26 DIAGNOSIS — M1712 Unilateral primary osteoarthritis, left knee: Secondary | ICD-10-CM | POA: Diagnosis not present

## 2023-04-26 DIAGNOSIS — M25562 Pain in left knee: Secondary | ICD-10-CM | POA: Diagnosis not present

## 2023-04-26 DIAGNOSIS — M25662 Stiffness of left knee, not elsewhere classified: Secondary | ICD-10-CM | POA: Diagnosis not present

## 2023-04-26 DIAGNOSIS — R262 Difficulty in walking, not elsewhere classified: Secondary | ICD-10-CM | POA: Diagnosis not present

## 2023-05-22 DIAGNOSIS — M1712 Unilateral primary osteoarthritis, left knee: Secondary | ICD-10-CM | POA: Diagnosis not present

## 2023-05-22 DIAGNOSIS — R262 Difficulty in walking, not elsewhere classified: Secondary | ICD-10-CM | POA: Diagnosis not present

## 2023-05-22 DIAGNOSIS — M25662 Stiffness of left knee, not elsewhere classified: Secondary | ICD-10-CM | POA: Diagnosis not present

## 2023-05-22 DIAGNOSIS — M25562 Pain in left knee: Secondary | ICD-10-CM | POA: Diagnosis not present

## 2023-05-31 DIAGNOSIS — M25562 Pain in left knee: Secondary | ICD-10-CM | POA: Diagnosis not present

## 2023-05-31 DIAGNOSIS — M25662 Stiffness of left knee, not elsewhere classified: Secondary | ICD-10-CM | POA: Diagnosis not present

## 2023-05-31 DIAGNOSIS — R262 Difficulty in walking, not elsewhere classified: Secondary | ICD-10-CM | POA: Diagnosis not present

## 2023-05-31 DIAGNOSIS — M1712 Unilateral primary osteoarthritis, left knee: Secondary | ICD-10-CM | POA: Diagnosis not present

## 2023-06-07 DIAGNOSIS — M25562 Pain in left knee: Secondary | ICD-10-CM | POA: Diagnosis not present

## 2023-06-07 DIAGNOSIS — M25662 Stiffness of left knee, not elsewhere classified: Secondary | ICD-10-CM | POA: Diagnosis not present

## 2023-06-07 DIAGNOSIS — R262 Difficulty in walking, not elsewhere classified: Secondary | ICD-10-CM | POA: Diagnosis not present

## 2023-06-07 DIAGNOSIS — M1712 Unilateral primary osteoarthritis, left knee: Secondary | ICD-10-CM | POA: Diagnosis not present

## 2023-06-14 DIAGNOSIS — R262 Difficulty in walking, not elsewhere classified: Secondary | ICD-10-CM | POA: Diagnosis not present

## 2023-06-14 DIAGNOSIS — M25562 Pain in left knee: Secondary | ICD-10-CM | POA: Diagnosis not present

## 2023-06-14 DIAGNOSIS — M25662 Stiffness of left knee, not elsewhere classified: Secondary | ICD-10-CM | POA: Diagnosis not present

## 2023-06-14 DIAGNOSIS — M1712 Unilateral primary osteoarthritis, left knee: Secondary | ICD-10-CM | POA: Diagnosis not present

## 2023-06-21 DIAGNOSIS — M25562 Pain in left knee: Secondary | ICD-10-CM | POA: Diagnosis not present

## 2023-06-21 DIAGNOSIS — M1712 Unilateral primary osteoarthritis, left knee: Secondary | ICD-10-CM | POA: Diagnosis not present

## 2023-06-21 DIAGNOSIS — R262 Difficulty in walking, not elsewhere classified: Secondary | ICD-10-CM | POA: Diagnosis not present

## 2023-06-21 DIAGNOSIS — M25662 Stiffness of left knee, not elsewhere classified: Secondary | ICD-10-CM | POA: Diagnosis not present

## 2023-06-28 DIAGNOSIS — R262 Difficulty in walking, not elsewhere classified: Secondary | ICD-10-CM | POA: Diagnosis not present

## 2023-06-28 DIAGNOSIS — M25562 Pain in left knee: Secondary | ICD-10-CM | POA: Diagnosis not present

## 2023-06-28 DIAGNOSIS — M25662 Stiffness of left knee, not elsewhere classified: Secondary | ICD-10-CM | POA: Diagnosis not present

## 2023-06-28 DIAGNOSIS — M1712 Unilateral primary osteoarthritis, left knee: Secondary | ICD-10-CM | POA: Diagnosis not present

## 2023-07-15 ENCOUNTER — Encounter: Payer: Self-pay | Admitting: Endocrinology

## 2023-07-15 ENCOUNTER — Ambulatory Visit (INDEPENDENT_AMBULATORY_CARE_PROVIDER_SITE_OTHER): Payer: Medicare HMO | Admitting: Endocrinology

## 2023-07-15 VITALS — BP 162/82 | HR 76 | Resp 18 | Ht 63.75 in | Wt 173.2 lb

## 2023-07-15 DIAGNOSIS — E7800 Pure hypercholesterolemia, unspecified: Secondary | ICD-10-CM

## 2023-07-15 DIAGNOSIS — E1165 Type 2 diabetes mellitus with hyperglycemia: Secondary | ICD-10-CM | POA: Diagnosis not present

## 2023-07-15 DIAGNOSIS — E1169 Type 2 diabetes mellitus with other specified complication: Secondary | ICD-10-CM

## 2023-07-15 DIAGNOSIS — E119 Type 2 diabetes mellitus without complications: Secondary | ICD-10-CM

## 2023-07-15 DIAGNOSIS — E042 Nontoxic multinodular goiter: Secondary | ICD-10-CM

## 2023-07-15 DIAGNOSIS — Z7984 Long term (current) use of oral hypoglycemic drugs: Secondary | ICD-10-CM | POA: Diagnosis not present

## 2023-07-15 LAB — POCT GLYCOSYLATED HEMOGLOBIN (HGB A1C): Hemoglobin A1C: 7.6 % — AB (ref 4.0–5.6)

## 2023-07-15 NOTE — Progress Notes (Signed)
Outpatient Endocrinology Note [place] Theus Espin, MD  07/15/23  Patient's Name: Janet Williamson    DOB: [birth year]    MRN: [id number]                                                    REASON OF VISIT: Follow up for type 2 diabetes mellitus  PCP: Irven Coe, MD  HISTORY OF PRESENT ILLNESS:   Janet Williamson is a 82 y.o. old female with past medical history listed below, is here for follow up for type 2 diabetes mellitus /multinodular goiter.   Pertinent Diabetes History: Patient was diagnosed with type 2 diabetes mellitus in 2013.  She has relatively well-controlled type 2 diabetes mellitus controlled on metformin extended release.  Chronic Diabetes Complications : Retinopathy: no. Last ophthalmology exam was done annually reportedly. Nephropathy: no, She had swelling of the lips with ramipril and has not been prescribed an ARB /ACE drug  Peripheral neuropathy: no Coronary artery disease: no Stroke: stroke  Relevant comorbidities and cardiovascular risk factors: Obesity: no Body mass index is 29.96 kg/m.  Hypertension: yes Hyperlipidemia. Yes, on a statin.  Current / Home Diabetic regimen includes: Metformin extended release 750 mg 2 tablets daily.  Prior diabetic medications:  Glycemic data:   Not able to download the glucometer in the clinic today.  Blood sugar reviewed directly from the meter.  She has been checking in the morning, in the afternoon and around suppertime, some of the blood sugar as follows.  273, 296, 184, 244, 184, 178, 154, 207, 181, 166, 159, 240,154, 165, 150, 94.    Variable blood sugar.  Blood sugar as low as 90 4 in the morning.  Some of the other time she has blood sugar up to 200s.  Hypoglycemia: Patient has no hypoglycemic episodes. Patient has hypoglycemia awareness.  Factors modifying glucose control: 1.  Diabetic diet assessment: 3 meals a day.  Sometimes ice cream.  2.  Staying active or exercising: Not able to exercise due to  musculoskeletal pain.  3.  Medication compliance: compliant all of the time.  # Multinodular goiter longstanding.  Last ultrasound was in August 2013, showed largest nodule to be 4.1 cm in the isthmus.  She has occasional neck discomfort and feeling of choking but no difficulty swallowing and has remained stable for several years.  # Hypercalcemia : She has occasional high serum calcium however has remained upper normal recently.  She does take hydrochlorothiazide for hypertension.  PTH was low normal previously. Upper normal calcium levels without hyperparathyroidism, stable and may be related to hydrochlorothiazide.  Interval history 07/15/23 Glucometer data as reviewed above.  She has been taking metformin, no GI issues.  Hemoglobin A1c went up to 7.6%.  She denies vision problem.  No numbness and tingling of feet.  No other complaints today.  REVIEW OF SYSTEMS As per history of present illness.   PAST MEDICAL HISTORY: 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 Not seen on most recent TEE 2019.   Cardiomyopathy in other disease    Cataracts, bilateral    Diabetes mellitus    Type 2   Generalized osteoarthritis    GERD (gastroesophageal reflux disease)    HLD (hyperlipidemia)  Hypertension    Hypothyroidism    LBBB (left bundle branch block)    PAF (paroxysmal atrial fibrillation) (HCC)    a. s/p DCCV 06/2017, on Xarelto   Phlebitis    S/P TAVR (transcatheter aortic valve replacement) 04/15/2018   Edwards Sapien 3 THV (size 23 mm, model # 9600TFX, serial # [id number]) via the TF approach   Severe aortic stenosis    a. s/p TAVR 04/2018.   Stroke [facility]) 2008    PAST SURGICAL HISTORY: Past Surgical History:  Procedure Laterality Date   ABDOMINAL HYSTERECTOMY     BIOPSY  05/19/2019   Procedure: BIOPSY;  Surgeon: Bernette Redbird, MD;  Location: [facility] ENDOSCOPY;  Service: Endoscopy;;    BREAST BIOPSY Left    years ago at "a doctor's office"   COLONOSCOPY     COLONOSCOPY WITH PROPOFOL N/A 10/02/2018   Procedure: COLONOSCOPY WITH PROPOFOL;  Surgeon: [name], MD;  Location: [facility] ENDOSCOPY;  Service: Endoscopy;  Laterality: N/A;   ENTEROSCOPY N/A 05/19/2019   Procedure: ENTEROSCOPY;  Surgeon: Bernette Redbird, MD;  Location: [facility] ENDOSCOPY;  Service: Endoscopy;  Laterality: N/A;   ESOPHAGOGASTRODUODENOSCOPY (EGD) WITH PROPOFOL N/A 10/02/2018   Procedure: ESOPHAGOGASTRODUODENOSCOPY (EGD) WITH PROPOFOL;  Surgeon: [name], MD;  Location: [facility] ENDOSCOPY;  Service: Endoscopy;  Laterality: N/A;   ESOPHAGOGASTRODUODENOSCOPY (EGD) WITH PROPOFOL N/A 04/23/2019   Procedure: ESOPHAGOGASTRODUODENOSCOPY (EGD) WITH PROPOFOL;  Surgeon: Graylin Shiver, MD;  Location: [facility] ENDOSCOPY;  Service: Endoscopy;  Laterality: N/A;   EYE SURGERY Bilateral    cataract removal   GIVENS CAPSULE STUDY N/A 10/02/2018   Procedure: GIVENS CAPSULE STUDY;  Surgeon: [name], MD;  Location: [facility] ENDOSCOPY;  Service: Endoscopy;  Laterality: N/A;   RIGHT/LEFT HEART CATH AND CORONARY ANGIOGRAPHY N/A 03/06/2018   Procedure: RIGHT/LEFT HEART CATH AND CORONARY ANGIOGRAPHY;  Surgeon: Lyn Records, MD;  Location: [facility] INVASIVE CV LAB;  Service: Cardiovascular;  Laterality: N/A;   SMALL BOWEL ENTEROSCOPY  05/19/2019   TEE WITHOUT CARDIOVERSION N/A 04/01/2018   Procedure: TRANSESOPHAGEAL ECHOCARDIOGRAM (TEE);  Surgeon: Jake Bathe, MD;  Location: [facility] ENDOSCOPY;  Service: Cardiovascular;  Laterality: N/A;   TEE WITHOUT CARDIOVERSION N/A 04/15/2018   Procedure: TRANSESOPHAGEAL ECHOCARDIOGRAM (TEE);  Surgeon: Tonny Bollman, MD;  Location: [facility] OR;  Service: Open Heart Surgery;  Laterality: N/A;   TOTAL KNEE ARTHROPLASTY Right 04/14/2013   Dr August Saucer   TOTAL KNEE ARTHROPLASTY Right 04/14/2013   Procedure: TOTAL KNEE ARTHROPLASTY;  Surgeon: Cammy Copa, MD;  Location: [facility] OR;  Service: Orthopedics;  Laterality:  Right;   TRANSCATHETER AORTIC VALVE REPLACEMENT, TRANSFEMORAL  04/15/2018   TRANSCATHETER AORTIC VALVE REPLACEMENT, TRANSFEMORAL Bilateral 04/15/2018   Procedure: TRANSCATHETER AORTIC VALVE REPLACEMENT, TRANSFEMORAL;  Surgeon: Tonny Bollman, MD;  Location: [facility] OR;  Service: Open Heart Surgery;  Laterality: Bilateral;    ALLERGIES: Allergies  Allergen Reactions   Ace Inhibitors Swelling and Other (See Comments)    Angioedema    Keflex [Cephalexin] Swelling and Other (See Comments)    Lips became swollwn   Rifadin [Rifampin] Swelling and Other (See Comments)    Lips became swollen    FAMILY HISTORY:  Family History  Problem Relation Age of Onset   Stroke Mother    Hypertension Mother    Hypertension Father    Heart attack Neg Hx     SOCIAL HISTORY: Social History   Socioeconomic History   Marital status: Widowed    Spouse name: Not on file   Number of children: Not on file   Years  of education: Not on file   Highest education level: Not on file  Occupational History   Not on file  Tobacco Use   Smoking status: Never   Smokeless tobacco: Never  Vaping Use   Vaping status: Never Used  Substance and Sexual Activity   Alcohol use: No   Drug use: No   Sexual activity: Not Currently  Other Topics Concern   Not on file  Social History Narrative   Not on file   Social Determinants of Health   Financial Resource Strain: Not on file  Food Insecurity: Not on file  Transportation Needs: Not on file  Physical Activity: Not on file  Stress: Not on file  Social Connections: Not on file    MEDICATIONS:  Current Outpatient Medications  Medication Sig Dispense Refill   acetaminophen (TYLENOL) 325 MG tablet Take 325-650 mg by mouth every 8 (eight) hours as needed (for headaches).     amLODipine (NORVASC) 5 MG tablet Take 5 mg by mouth daily.     Blood Glucose Monitoring Suppl (ACCU-CHEK GUIDE ME) w/Device KIT by Does not apply route.     Blood Glucose Monitoring Suppl  (ONE TOUCH ULTRA 2) w/Device KIT Use to check blood sugars three times daily DX: E11.9 1 kit 0   Cholecalciferol (VITAMIN D3) 5000 units CAPS Take 5,000 Units by mouth daily.     hydrochlorothiazide (HYDRODIURIL) 25 MG tablet Take 25 mg by mouth daily.      hydrOXYzine (ATARAX/VISTARIL) 25 MG tablet Take 25 mg by mouth 3 (three) times daily as needed.     Lancets (ONETOUCH DELICA PLUS LANCET33G) MISC TEST BLOOD SUGAR THREE TIMES DAILY 300 each 10   metFORMIN (GLUCOPHAGE-XR) 750 MG 24 hr tablet TAKE 2 TABLETS ONE TIME DAILY WITH BREAKFAST 180 tablet 2   metoprolol succinate (TOPROL-XL) 50 MG 24 hr tablet Take 50 mg by mouth daily.      ONETOUCH ULTRA TEST test strip TEST BLOOD SUGAR THREE TIMES DAILY 300 strip 3   Polyethyl Glycol-Propyl Glycol (SYSTANE OP) Place 1 drop into both eyes 2 (two) times daily.      simvastatin (ZOCOR) 20 MG tablet Take 20 mg by mouth every evening.     No current facility-administered medications for this visit.    PHYSICAL EXAM: Vitals:   07/15/23 0827 07/15/23 0829  BP: (!) 160/76 (!) 162/82  Pulse: 76   Resp: 18   SpO2: 96%   Weight: 173 lb 3.2 oz (78.6 kg)   Height: 5' 3.75" (1.619 m)    Body mass index is 29.96 kg/m.  Wt Readings from Last 3 Encounters:  07/15/23 173 lb 3.2 oz (78.6 kg)  01/17/23 187 lb (84.8 kg)  07/13/22 192 lb (87.1 kg)    General: Well developed, well nourished female in no apparent distress.  HEENT: AT/[place], no external lesions.  Eyes: Conjunctiva clear and no icterus. Neck: Neck supple.  Thyromegaly with isthmus nodule about 3 cm, mobile soft, nontender present. Lungs: Respirations not labored Neurologic: Alert, oriented, normal speech Extremities / Skin: Dry.  Psychiatric: Does not appear depressed or anxious  Diabetic Foot Exam - Simple   No data filed    LABS Reviewed Lab Results  Component Value Date   HGBA1C 7.6 (A) 07/15/2023   HGBA1C 6.8 (H) 01/15/2023   HGBA1C 6.9 (H) 07/12/2022   Lab Results  Component  Value Date   FRUCTOSAMINE 250 12/03/2019   FRUCTOSAMINE 247 08/03/2019   Lab Results  Component Value Date  CHOL 152 01/15/2023   HDL 47.30 01/15/2023   LDLCALC 92 01/15/2023   TRIG 63.0 01/15/2023   CHOLHDL 3 01/15/2023   Lab Results  Component Value Date   MICRALBCREAT 4.0 01/15/2023   MICRALBCREAT 2.1 12/19/2021   Lab Results  Component Value Date   CREATININE 0.78 01/15/2023   Lab Results  Component Value Date   GFR 70.90 01/15/2023    ASSESSMENT / PLAN  1. Diabetes mellitus, stable (HCC)   2. Hypercholesterolemia   3. Type 2 diabetes mellitus with other specified complication, without long-term current use of insulin (HCC)   4. Uncontrolled type 2 diabetes mellitus with hyperglycemia, without long-term current use of insulin (HCC)   5. Multinodular goiter     Diabetes Mellitus type 2, complicated by stroke. - Diabetic status / severity: Fair control.  Lab Results  Component Value Date   HGBA1C 7.6 (A) 07/15/2023    - Hemoglobin A1c goal : <7.5%  Discussed about increasing dose of metformin versus improvement on the diet.  She wants to try diet improvement.  - Medications: No change.  I) metformin extended release 750 mg 2 tablets daily.  - Home glucose testing: At least in the morning fasting. - Discussed/ Gave Hypoglycemia treatment plan.  # Consult : not required at this time.   # Annual urine for microalbuminuria/ creatinine ratio, no microalbuminuria currently. Last  Lab Results  Component Value Date   MICRALBCREAT 4.0 01/15/2023    # Foot check nightly / neuropathy.  # Annual dilated diabetic eye exams.   - Diet: Make healthy diabetic food choices - Life style / activity / exercise: Discussed.  2. Blood pressure  -  BP Readings from Last 1 Encounters:  07/15/23 (!) 162/82    - Control is not in target.  - No change in current plans.  No symptoms.  Advised to talk with primary care provider.  3. Lipid status / Hyperlipidemia -  Last  Lab Results  Component Value Date   LDLCALC 92 01/15/2023   - Continue simvastatin 20 mg daily.  # Multinodular goiter : Longstanding.  Last ultrasound in 2013 with largest nodule measuring 4.1 cm in the isthmus. -Will continue to monitor. ?  Consider to repeat ultrasound thyroid in the future. -Annual thyroid lab.  Is euthyroid and not on thyroid medication.  # Check lab for BMP, hemoglobin A1c, urine microalbumin and creatinine ratio and lipid panel in 6 months prior to visit.  Diagnoses and all orders for this visit:  Diabetes mellitus, stable (HCC) -     POCT glycosylated hemoglobin (Hb A1C) -     Basic metabolic panel; Future -     Hemoglobin A1c; Future -     Microalbumin / creatinine urine ratio; Future -     Lipid panel; Future  Hypercholesterolemia -     Lipid panel; Future  Type 2 diabetes mellitus with other specified complication, without long-term current use of insulin (HCC)  Uncontrolled type 2 diabetes mellitus with hyperglycemia, without long-term current use of insulin (HCC)  Multinodular goiter    DISPOSITION Follow up in clinic in 6 months suggested.   All questions answered and patient verbalized understanding of the plan.  [place] Toya Palacios, MD [facility] Endocrinology [facility] [address] [place], [address] [place], [place] [address] Phone # [phone]  At least part of this note was generated using voice recognition software. Inadvertent word errors may have occurred, which were not recognized during the proofreading process.

## 2023-08-18 ENCOUNTER — Other Ambulatory Visit: Payer: Self-pay

## 2023-08-18 ENCOUNTER — Emergency Department (HOSPITAL_COMMUNITY)
Admission: EM | Admit: 2023-08-18 | Discharge: 2023-08-18 | Disposition: A | Discharge status: Home / Self Care | Payer: Medicare HMO | Attending: Emergency Medicine | Admitting: Emergency Medicine

## 2023-08-18 ENCOUNTER — Encounter (HOSPITAL_COMMUNITY): Payer: Self-pay

## 2023-08-18 DIAGNOSIS — Z794 Long term (current) use of insulin: Secondary | ICD-10-CM | POA: Diagnosis not present

## 2023-08-18 DIAGNOSIS — R110 Nausea: Secondary | ICD-10-CM | POA: Insufficient documentation

## 2023-08-18 DIAGNOSIS — I10 Essential (primary) hypertension: Secondary | ICD-10-CM | POA: Insufficient documentation

## 2023-08-18 DIAGNOSIS — E119 Type 2 diabetes mellitus without complications: Secondary | ICD-10-CM | POA: Insufficient documentation

## 2023-08-18 DIAGNOSIS — Z7984 Long term (current) use of oral hypoglycemic drugs: Secondary | ICD-10-CM | POA: Diagnosis not present

## 2023-08-18 DIAGNOSIS — Z79899 Other long term (current) drug therapy: Secondary | ICD-10-CM | POA: Diagnosis not present

## 2023-08-18 DIAGNOSIS — Z87891 Personal history of nicotine dependence: Secondary | ICD-10-CM | POA: Insufficient documentation

## 2023-08-18 LAB — COMPREHENSIVE METABOLIC PANEL
ALT: 12 U/L (ref 0–44)
AST: 17 U/L (ref 15–41)
Albumin: 3.3 g/dL — ABNORMAL LOW (ref 3.5–5.0)
Alkaline Phosphatase: 62 U/L (ref 38–126)
Anion gap: 8 (ref 5–15)
BUN: 13 mg/dL (ref 8–23)
CO2: 27 mmol/L (ref 22–32)
Calcium: 9.9 mg/dL (ref 8.9–10.3)
Chloride: 101 mmol/L (ref 98–111)
Creatinine, Ser: 0.54 mg/dL (ref 0.44–1.00)
GFR, Estimated: >60 mL/min (ref >60)
Glucose, Bld: 142 mg/dL — ABNORMAL HIGH (ref 70–99)
Potassium: 4 mmol/L (ref 3.5–5.1)
Sodium: 136 mmol/L (ref 135–145)
Total Bilirubin: 0.4 mg/dL (ref <1.2)
Total Protein: 7.6 g/dL (ref 6.5–8.1)

## 2023-08-18 LAB — CBC
HCT: 35.1 % — ABNORMAL LOW (ref 36.0–46.0)
Hemoglobin: 10.5 g/dL — ABNORMAL LOW (ref 12.0–15.0)
MCH: 22.7 pg — ABNORMAL LOW (ref 26.0–34.0)
MCHC: 29.9 g/dL — ABNORMAL LOW (ref 30.0–36.0)
MCV: 76 fL — ABNORMAL LOW (ref 80.0–100.0)
Platelets: 343 10*3/uL (ref 150–400)
RBC: 4.62 MIL/uL (ref 3.87–5.11)
RDW: 17.8 % — ABNORMAL HIGH (ref 11.5–15.5)
WBC: 8.2 10*3/uL (ref 4.0–10.5)
nRBC: 0 % (ref 0.0–0.2)

## 2023-08-18 LAB — TROPONIN I (HIGH SENSITIVITY): Troponin I (High Sensitivity): 6 ng/L (ref <18)

## 2023-08-18 LAB — TSH: TSH: 1.189 u[IU]/mL (ref 0.350–4.500)

## 2023-08-18 LAB — LIPASE, BLOOD: Lipase: 24 U/L (ref 11–51)

## 2023-08-18 NOTE — ED Notes (Signed)
Pt provided with PO challenge and passed. EDP advised

## 2023-08-18 NOTE — ED Triage Notes (Signed)
Reports abd pain nausea and can feel her stomach bubbling.  +diarrhea.

## 2023-08-18 NOTE — ED Notes (Signed)
Pt ambulated to bathroom without assistance 

## 2023-08-18 NOTE — ED Provider Notes (Signed)
[place] EMERGENCY DEPARTMENT AT [facility] Provider Note   CSN: [id number] Arrival date & time: 08/18/23  1321     History  Chief Complaint  Patient presents with   Abdominal Pain    Janet Williamson is a 82 y.o. female with PMH T2DM, HTN, HLD, hypothyroidis, PAF not on Xarelto d/t severe GIB, TAVR in 2019, stroke in 2008 here for abdominal pain and difficulty eating. Started today while at church. Suddenly felt sick and dizzy. States abdomen doesn't actually hurt, just feels "sick on my stomach." No vomiting. Has had diarrhea but this has been present for a week, once a day, is not bloody, no fevers chills, taking Pepto bismol and is helping; no recent raw meat ingestion or travel. States her blood sugar has been running high recently to 310, last checked yesterday. Not on insulin. Dizziness lasted 10 seconds when the sick sensation started, but resolved. Describes as things going blurry but also spinning. Never had similar sensation. The nausea and sick feeling have persisted for several hours but also resolved as of 2.5 hours ago. Patient now feels completely back to baseline/normal/symptom free.  No urinary symptoms. No chest pain, SOB, jaw pain or arm pain, history of CAD/MI, heart palpitations. Hx provoked DVT after knee surgery in 2013 with an IVC filter temporarily placed since removed, not on blood thinners. Has had ginger ale/taken PO today  Last echo was in 2020 per chart review. EF 65%, mod MR/TR, normal TAVR with elevated mean gradient of 30 (previously 24).   The history is provided by the patient and medical records.       Home Medications Prior to Admission medications   Medication Sig Start Date End Date Taking? Authorizing Provider  acetaminophen (TYLENOL) 325 MG tablet Take 325-650 mg by mouth every 8 (eight) hours as needed (for headaches).    [provider]  amLODipine (NORVASC) 5 MG tablet Take 5 mg by mouth daily.    [provider]  Blood Glucose Monitoring Suppl (ACCU-CHEK GUIDE ME) w/Device KIT by Does not apply route.    [provider]  Blood Glucose Monitoring Suppl (ONE TOUCH ULTRA 2) w/Device KIT Use to check blood sugars three times daily DX: E11.9 08/23/20   Reather Littler, MD  Cholecalciferol (VITAMIN D3) 5000 units CAPS Take 5,000 Units by mouth daily.    [provider]  hydrochlorothiazide (HYDRODIURIL) 25 MG tablet Take 25 mg by mouth daily.     [provider]  hydrOXYzine (ATARAX/VISTARIL) 25 MG tablet Take 25 mg by mouth 3 (three) times daily as needed. 08/03/19   [provider]  Lancets (ONETOUCH DELICA PLUS LANCET33G) MISC TEST BLOOD SUGAR THREE TIMES DAILY 08/16/22   Reather Littler, MD  metFORMIN (GLUCOPHAGE-XR) 750 MG 24 hr tablet TAKE 2 TABLETS ONE TIME DAILY WITH BREAKFAST 01/27/23   Reather Littler, MD  metoprolol succinate (TOPROL-XL) 50 MG 24 hr tablet Take 50 mg by mouth daily.     [provider]  Koren Bound TEST test strip TEST BLOOD SUGAR THREE TIMES DAILY 04/12/23   Reather Littler, MD  Polyethyl Glycol-Propyl Glycol (SYSTANE OP) Place 1 drop into both eyes 2 (two) times daily.     [provider]  simvastatin (ZOCOR) 20 MG tablet Take 20 mg by mouth every evening.    [provider]      Allergies    Ace inhibitors, Keflex [cephalexin], and Rifadin [rifampin]    Review of Systems   Review of  Systems  Physical Exam Updated Vital Signs BP (!) 165/62   Pulse (!) 56   Temp 98.2 F (36.8 C) (Oral)   Resp 20   Ht 5' 3.5" (1.613 m)   Wt 78.5 kg   SpO2 100%   BMI 30.16 kg/m  Physical Exam Constitutional:      General: She is not in acute distress.    Appearance: She is well-developed. She is not ill-appearing.  HENT:     Head: Normocephalic and atraumatic.     Mouth/Throat:     Mouth: Mucous membranes are moist.  Eyes:     Extraocular Movements: Extraocular movements intact.     Pupils: Pupils are equal, round, and reactive to  light.  Cardiovascular:     Rate and Rhythm: Normal rate and regular rhythm.     Heart sounds: Murmur (soft systolic murmur loudest RUSB) heard.  Pulmonary:     Effort: Pulmonary effort is normal.     Breath sounds: Normal breath sounds.  Abdominal:     General: There is no distension.     Palpations: Abdomen is soft.     Tenderness: There is no abdominal tenderness. There is no guarding or rebound. Negative signs include Murphy's sign and McBurney's sign.  Musculoskeletal:     Right lower leg: No edema.     Left lower leg: No edema.  Skin:    General: Skin is warm and dry.     Capillary Refill: Capillary refill takes less than 2 seconds.  Neurological:     General: No focal deficit present.     Mental Status: She is alert and oriented to person, place, and time.     Cranial Nerves: No cranial nerve deficit.     Motor: No weakness.     ED Results / Procedures / Treatments   Labs (all labs ordered are listed, but only abnormal results are displayed) Labs Reviewed  COMPREHENSIVE METABOLIC PANEL - Abnormal; Notable for the following components:      Result Value   Glucose, Bld 142 (*)    Albumin 3.3 (*)    All other components within normal limits  CBC - Abnormal; Notable for the following components:   Hemoglobin 10.5 (*)    HCT 35.1 (*)    MCV 76.0 (*)    MCH 22.7 (*)    MCHC 29.9 (*)    RDW 17.8 (*)    All other components within normal limits  LIPASE, BLOOD  TSH  URINALYSIS, ROUTINE W REFLEX MICROSCOPIC  TROPONIN I (HIGH SENSITIVITY)    EKG EKG Interpretation Date/Time:  Sunday August 18 2023 15:54:39 EST Ventricular Rate:  58 PR Interval:  195 QRS Duration:  148 QT Interval:  472 QTC Calculation: 464 R Axis:   -43  Text Interpretation: Sinus rhythm Left bundle branch block Baseline wander in lead(s) I No significant change since last tracing Confirmed by Linwood Dibbles [phone]) on 08/18/2023 4:05:58 PM  Radiology No results found.  Procedures Procedures     Medications Ordered in ED Medications - No data to display  ED Course/ Medical Decision Making/ A&P Clinical Course as of 08/18/23 1815  Sun Aug 18, 2023  1756 Urinalysis, Routine w reflex microscopic -Urine, Clean Catch [GD]    Clinical Course User Index [GD] Karmen Stabs, MD                                 Medical Decision Making  Amount and/or Complexity of Data Reviewed Labs: ordered. Decision-making details documented in ED Course.   82 y/o F with PMH T2DM, HTN, HLD, hypothyroidis, PAF not on Xarelto d/t severe GIB, TAVR in 2019, stroke here for an episode of brief lightheadedness/dizziness lasting 10 seconds and nausea that lasted several hours but resolved. DDX considered: ACS/MI, DKA, CHF, PE, arrhythmia, abdominal surgical emergency, dissection, electrolyte/metabolic derangement, hypoglycemia or hyperglycemia, infection, GERD, thyroid derangement, biliary pathology, hepatitis, pancreatitis.   Patient is hemodynamically stable afebrile and well-appearing on exam.  She is not septic or ill-appearing.  She is currently back to baseline and symptom-free.  Abdominal exam is reassuring as described above, advanced imaging of the abdomen is not indicated this encounter as with full resolution of symptoms and acute surgical process or catastrophe is unlikely.  The lipase is normal making pancreatitis unlikely, LFTs and renal function are normal making biliary obstruction or hepatitis or acute renal failure unlikely.  Electrolytes are normal.  Glucose is 142 and anion gap and bicarb are normal making DKA unlikely.  TSH was obtained and is normal making thyroid derangement unlikely cause of symptoms. Her physical exam with equal pulses normal neuro exam and return to baseline makes dissection unlikely. PE was considered given the transient dizziness but with normal HR, O2 sat, no CP or SOB at all during the symptoms that brought her in or currently, exceedingly low suspicion and further  workup not indicated. CHF considered, but not volume overloaded clinically and no history of this. UTI was considered but no urinary symptoms. Transient dysrhythmia was considered given the dizziness but with no syncope or chest pain or palpitations this is less likely.   ACS/MI was considered also, troponin was obtained and normal; EKG shows normal sinus rhythm with a rate of 58 and a leftward axis with a left bundle branch block that is not new from prior.  There is no STEMI and it does not meet the Sgarbossa criteria to cause concern for STEMI equivalent.  There are no active ischemic changes.  This EKG is unchanged from prior on my independent review. Given reassuring EKG and troponin, very low suspicion for ACS.   Unclear etiology for symptoms though symptoms have completely resolved.  I discussed patient's above workup with her.  Based on above acute life-threatening pathology is unlikely.  Is possible she may have been hyperglycemic at the time of symptoms but this resolved; or may have had mild vagal symptoms (nausea + dizziness) or other benign process. Patient is requesting to go home. Able to take PO in the ED. Stable and appropriate for discharge.  Recommended PCP follow-up in 3 to 5 days.  Strict return precautions were discussed.  Patient was discharged from the ED         Final Clinical Impression(s) / ED Diagnoses Final diagnoses:  Nausea    Rx / DC Orders ED Discharge Orders     None         Karmen Stabs, MD 08/18/23 Nobie Putnam    Linwood Dibbles, MD 08/18/23 1954

## 2023-08-18 NOTE — Discharge Instructions (Signed)
You were seen today in the emergency department for nausea and dizziness that resolved.  While you were here we checked labs, and EKG did a physical exam.  This did not show any acute life-threatening cause of your symptoms.  Is unclear what caused her symptoms, though some possibilities may have included a higher than usual blood sugar during the time of your symptoms (though your blood sugar was fairly normal here; so your symptoms may have resolved as your blood sugar improved) or another nonemergent condition that we are not able to test for or diagnosis within the limitations of the emergency department.  Please follow-up with your primary care doctor for repeat physical exam.  Please return to the emergency department if your symptoms return or if you develop chest pain, shortness of breath, headaches, fevers or chills, vomiting, pain with urination, or any new or concerning symptoms.

## 2023-09-24 DIAGNOSIS — R634 Abnormal weight loss: Secondary | ICD-10-CM | POA: Diagnosis not present

## 2023-09-24 DIAGNOSIS — R109 Unspecified abdominal pain: Secondary | ICD-10-CM | POA: Diagnosis not present

## 2023-09-24 DIAGNOSIS — I10 Essential (primary) hypertension: Secondary | ICD-10-CM | POA: Diagnosis not present

## 2023-09-24 DIAGNOSIS — R110 Nausea: Secondary | ICD-10-CM | POA: Diagnosis not present

## 2023-09-24 DIAGNOSIS — E1165 Type 2 diabetes mellitus with hyperglycemia: Secondary | ICD-10-CM | POA: Diagnosis not present

## 2023-09-25 ENCOUNTER — Other Ambulatory Visit: Payer: Self-pay | Admitting: Physician Assistant

## 2023-09-25 DIAGNOSIS — R109 Unspecified abdominal pain: Secondary | ICD-10-CM

## 2023-10-07 ENCOUNTER — Encounter: Payer: Self-pay | Admitting: Physician Assistant

## 2023-10-08 ENCOUNTER — Ambulatory Visit
Admission: RE | Admit: 2023-10-08 | Discharge: 2023-10-08 | Disposition: A | Discharge status: Home / Self Care | Payer: Medicare HMO | Source: Ambulatory Visit | Attending: Physician Assistant | Admitting: Physician Assistant

## 2023-10-08 DIAGNOSIS — K828 Other specified diseases of gallbladder: Secondary | ICD-10-CM | POA: Diagnosis not present

## 2023-10-08 DIAGNOSIS — K8689 Other specified diseases of pancreas: Secondary | ICD-10-CM | POA: Diagnosis not present

## 2023-10-08 DIAGNOSIS — R109 Unspecified abdominal pain: Secondary | ICD-10-CM

## 2023-10-08 DIAGNOSIS — K831 Obstruction of bile duct: Secondary | ICD-10-CM | POA: Diagnosis not present

## 2023-10-08 DIAGNOSIS — I700 Atherosclerosis of aorta: Secondary | ICD-10-CM | POA: Diagnosis not present

## 2023-10-08 MED ORDER — IOPAMIDOL (ISOVUE-300) INJECTION 61%
200.0000 mL | Freq: Once | INTRAVENOUS | Status: AC | PRN
  Administered 2023-10-08: 100 mL via INTRAVENOUS

## 2023-10-15 ENCOUNTER — Encounter (HOSPITAL_COMMUNITY): Payer: Self-pay | Admitting: Gastroenterology

## 2023-10-15 ENCOUNTER — Other Ambulatory Visit: Payer: Self-pay | Admitting: Gastroenterology

## 2023-10-15 DIAGNOSIS — C259 Malignant neoplasm of pancreas, unspecified: Secondary | ICD-10-CM | POA: Diagnosis not present

## 2023-10-15 NOTE — Anesthesia Preprocedure Evaluation (Addendum)
 Anesthesia Evaluation  Patient identified by MRN, date of birth, ID band Patient awake    Reviewed: Allergy & Precautions, NPO status , Patient's Chart, lab work & pertinent test results  History of Anesthesia Complications Negative for: history of anesthetic complications  Airway Mallampati: III  TM Distance: >3 FB Neck ROM: Full   Comment: Previous grade I view with MAC 4, mask with OPA Dental  (+) Dental Advisory Given, Upper Dentures, Lower Dentures   Pulmonary neg pulmonary ROS   Pulmonary exam normal breath sounds clear to auscultation       Cardiovascular hypertension (amlodipine , HCTZ, metoprolol ), Pt. on medications and Pt. on home beta blockers (-) angina (-) Past MI, (-) Cardiac Stents and (-) CABG + dysrhythmias (LBBB) Atrial Fibrillation + Valvular Problems/Murmurs (s/p TAVR 04/15/2018, moderate AS, MR, and TR)  Rhythm:Regular Rate:Normal + Systolic murmurs HLD  TTE 03/10/2019: IMPRESSIONS     1. The left ventricle has hyperdynamic systolic function, with an  ejection fraction of >65%. The cavity size was normal. There is moderate  concentric left ventricular hypertrophy. Left ventricular diastolic  Doppler parameters are consistent with  impaired relaxation. Elevated left ventricular end-diastolic pressure.   2. The right ventricle has normal systolic function. The cavity was  mildly enlarged. There is no increase in right ventricular wall thickness.  Right ventricular systolic pressure is mildly elevated with an estimated  pressure of 45.8 mmHg.   3. Left atrial size was mildly dilated.   4. Right atrial size was mildly dilated.   5. The mitral valve is degenerative. Mild thickening of the mitral valve  leaflet. Mild calcification of the mitral valve leaflet. There is moderate  mitral annular calcification present. Mitral valve regurgitation is  moderate by color flow Doppler. No  evidence of mitral valve  stenosis.   6. Tricuspid valve regurgitation is moderate.   7. Aortic valve regurgitation was not assessed by color flow Doppler.  Moderate stenosis of the aortic valve.     Neuro/Psych neg Seizures CVA (2008), No Residual Symptoms    GI/Hepatic Neg liver ROS,GERD  ,,  Endo/Other  diabetes, Type 2, Oral Hypoglycemic AgentsHypothyroidism    Renal/GU negative Renal ROS     Musculoskeletal  (+) Arthritis , Osteoarthritis,    Abdominal   Peds  Hematology  (+) Blood dyscrasia, anemia Lab Results      Component                Value               Date                      WBC                      8.2                 08/18/2023                HGB                      10.5 (L)            08/18/2023                HCT                      35.1 (L)            08/18/2023  MCV                      76.0 (L)            08/18/2023                PLT                      343                 08/18/2023              Anesthesia Other Findings   Reproductive/Obstetrics                             Anesthesia Physical Anesthesia Plan  ASA: 4  Anesthesia Plan: General   Post-op Pain Management:    Induction: Intravenous  PONV Risk Score and Plan: 3 and Ondansetron , Dexamethasone  and Treatment may vary due to age or medical condition  Airway Management Planned: Oral ETT  Additional Equipment: ClearSight  Intra-op Plan:   Post-operative Plan: Extubation in OR  Informed Consent: I have reviewed the patients History and Physical, chart, labs and discussed the procedure including the risks, benefits and alternatives for the proposed anesthesia with the patient or authorized representative who has indicated his/her understanding and acceptance.     Dental advisory given  Plan Discussed with: CRNA and Anesthesiologist  Anesthesia Plan Comments: (Risks of general anesthesia discussed including, but not limited to, sore throat, hoarse voice,  chipped/damaged teeth, injury to vocal cords, nausea and vomiting, allergic reactions, lung infection, heart attack, stroke, and death. All questions answered. )        Anesthesia Quick Evaluation

## 2023-10-16 ENCOUNTER — Ambulatory Visit (HOSPITAL_COMMUNITY): Payer: Medicare HMO

## 2023-10-16 ENCOUNTER — Ambulatory Visit (HOSPITAL_COMMUNITY): Payer: Self-pay | Admitting: Anesthesiology

## 2023-10-16 ENCOUNTER — Encounter (HOSPITAL_COMMUNITY): Payer: Self-pay | Admitting: Gastroenterology

## 2023-10-16 ENCOUNTER — Encounter (HOSPITAL_COMMUNITY)
Admission: RE | Disposition: A | Discharge status: Home / Self Care | Payer: Self-pay | Source: Home / Self Care | Attending: Gastroenterology

## 2023-10-16 ENCOUNTER — Other Ambulatory Visit: Payer: Self-pay

## 2023-10-16 ENCOUNTER — Ambulatory Visit (HOSPITAL_BASED_OUTPATIENT_CLINIC_OR_DEPARTMENT_OTHER): Payer: Medicare HMO | Admitting: Anesthesiology

## 2023-10-16 ENCOUNTER — Ambulatory Visit (HOSPITAL_COMMUNITY)
Admission: RE | Admit: 2023-10-16 | Discharge: 2023-10-16 | Disposition: A | Discharge status: Home / Self Care | Payer: Medicare HMO | Attending: Gastroenterology | Admitting: Gastroenterology

## 2023-10-16 DIAGNOSIS — K8689 Other specified diseases of pancreas: Secondary | ICD-10-CM | POA: Insufficient documentation

## 2023-10-16 DIAGNOSIS — C250 Malignant neoplasm of head of pancreas: Secondary | ICD-10-CM | POA: Diagnosis not present

## 2023-10-16 DIAGNOSIS — D490 Neoplasm of unspecified behavior of digestive system: Secondary | ICD-10-CM

## 2023-10-16 DIAGNOSIS — R748 Abnormal levels of other serum enzymes: Secondary | ICD-10-CM | POA: Diagnosis not present

## 2023-10-16 DIAGNOSIS — K831 Obstruction of bile duct: Secondary | ICD-10-CM | POA: Insufficient documentation

## 2023-10-16 DIAGNOSIS — Q759 Congenital malformation of skull and face bones, unspecified: Secondary | ICD-10-CM | POA: Insufficient documentation

## 2023-10-16 DIAGNOSIS — E119 Type 2 diabetes mellitus without complications: Secondary | ICD-10-CM

## 2023-10-16 DIAGNOSIS — I10 Essential (primary) hypertension: Secondary | ICD-10-CM | POA: Diagnosis not present

## 2023-10-16 DIAGNOSIS — K838 Other specified diseases of biliary tract: Secondary | ICD-10-CM | POA: Diagnosis not present

## 2023-10-16 DIAGNOSIS — Z4682 Encounter for fitting and adjustment of non-vascular catheter: Secondary | ICD-10-CM | POA: Diagnosis not present

## 2023-10-16 DIAGNOSIS — K869 Disease of pancreas, unspecified: Secondary | ICD-10-CM | POA: Diagnosis not present

## 2023-10-16 DIAGNOSIS — K6389 Other specified diseases of intestine: Secondary | ICD-10-CM

## 2023-10-16 DIAGNOSIS — R933 Abnormal findings on diagnostic imaging of other parts of digestive tract: Secondary | ICD-10-CM | POA: Diagnosis not present

## 2023-10-16 DIAGNOSIS — C221 Intrahepatic bile duct carcinoma: Secondary | ICD-10-CM | POA: Diagnosis not present

## 2023-10-16 DIAGNOSIS — I480 Paroxysmal atrial fibrillation: Secondary | ICD-10-CM | POA: Diagnosis not present

## 2023-10-16 DIAGNOSIS — R935 Abnormal findings on diagnostic imaging of other abdominal regions, including retroperitoneum: Secondary | ICD-10-CM | POA: Diagnosis not present

## 2023-10-16 DIAGNOSIS — C249 Malignant neoplasm of biliary tract, unspecified: Secondary | ICD-10-CM | POA: Diagnosis not present

## 2023-10-16 HISTORY — PX: EUS: SHX5427

## 2023-10-16 HISTORY — PX: ERCP: SHX5425

## 2023-10-16 HISTORY — PX: FINE NEEDLE ASPIRATION: SHX5430

## 2023-10-16 HISTORY — PX: BILIARY STENT PLACEMENT: SHX5538

## 2023-10-16 HISTORY — PX: SPHINCTEROTOMY: SHX5544

## 2023-10-16 HISTORY — PX: ESOPHAGOGASTRODUODENOSCOPY (EGD) WITH PROPOFOL: SHX5813

## 2023-10-16 HISTORY — PX: BILIARY BRUSHING: SHX6843

## 2023-10-16 LAB — CBC
HCT: 33.4 % — ABNORMAL LOW (ref 36.0–46.0)
Hemoglobin: 10.6 g/dL — ABNORMAL LOW (ref 12.0–15.0)
MCH: 24.5 pg — ABNORMAL LOW (ref 26.0–34.0)
MCHC: 31.7 g/dL (ref 30.0–36.0)
MCV: 77.1 fL — ABNORMAL LOW (ref 80.0–100.0)
Platelets: 278 10*3/uL (ref 150–400)
RBC: 4.33 MIL/uL (ref 3.87–5.11)
RDW: 19.4 % — ABNORMAL HIGH (ref 11.5–15.5)
WBC: 6.5 10*3/uL (ref 4.0–10.5)
nRBC: 0 % (ref 0.0–0.2)

## 2023-10-16 LAB — COMPREHENSIVE METABOLIC PANEL
ALT: 233 U/L — ABNORMAL HIGH (ref 0–44)
AST: 236 U/L — ABNORMAL HIGH (ref 15–41)
Albumin: 3.2 g/dL — ABNORMAL LOW (ref 3.5–5.0)
Alkaline Phosphatase: 513 U/L — ABNORMAL HIGH (ref 38–126)
Anion gap: 9 (ref 5–15)
BUN: 9 mg/dL (ref 8–23)
CO2: 28 mmol/L (ref 22–32)
Calcium: 9.7 mg/dL (ref 8.9–10.3)
Chloride: 101 mmol/L (ref 98–111)
Creatinine, Ser: 0.5 mg/dL (ref 0.44–1.00)
GFR, Estimated: >60 mL/min (ref >60)
Glucose, Bld: 172 mg/dL — ABNORMAL HIGH (ref 70–99)
Potassium: 3.4 mmol/L — ABNORMAL LOW (ref 3.5–5.1)
Sodium: 138 mmol/L (ref 135–145)
Total Bilirubin: 2.8 mg/dL — ABNORMAL HIGH (ref 0.0–1.2)
Total Protein: 7.3 g/dL (ref 6.5–8.1)

## 2023-10-16 LAB — GLUCOSE, CAPILLARY: Glucose-Capillary: 149 mg/dL — ABNORMAL HIGH (ref 70–99)

## 2023-10-16 SURGERY — ULTRASOUND, UPPER GI TRACT, ENDOSCOPIC
Anesthesia: General

## 2023-10-16 MED ORDER — DICLOFENAC SUPPOSITORY 100 MG
RECTAL | Status: DC | PRN
  Administered 2023-10-16: 100 mg via RECTAL

## 2023-10-16 MED ORDER — DICLOFENAC SUPPOSITORY 100 MG
RECTAL | Status: AC
Start: 2023-10-16 — End: ?
  Filled 2023-10-16: qty 1

## 2023-10-16 MED ORDER — PHENYLEPHRINE HCL-NACL 20-0.9 MG/250ML-% IV SOLN
INTRAVENOUS | Status: DC | PRN
  Administered 2023-10-16: 30 ug/min via INTRAVENOUS

## 2023-10-16 MED ORDER — PROPOFOL 10 MG/ML IV BOLUS
INTRAVENOUS | Status: DC | PRN
  Administered 2023-10-16: 50 mg via INTRAVENOUS
  Administered 2023-10-16: 20 mg via INTRAVENOUS

## 2023-10-16 MED ORDER — ONDANSETRON HCL 4 MG/2ML IJ SOLN
INTRAMUSCULAR | Status: DC | PRN
  Administered 2023-10-16: 4 mg via INTRAVENOUS

## 2023-10-16 MED ORDER — GLUCAGON HCL RDNA (DIAGNOSTIC) 1 MG IJ SOLR
INTRAMUSCULAR | Status: AC
Start: 2023-10-16 — End: ?
  Filled 2023-10-16: qty 2

## 2023-10-16 MED ORDER — CIPROFLOXACIN IN D5W 400 MG/200ML IV SOLN
INTRAVENOUS | Status: AC
  Filled 2023-10-16: qty 200

## 2023-10-16 MED ORDER — PROPOFOL 10 MG/ML IV BOLUS
INTRAVENOUS | Status: AC
  Filled 2023-10-16: qty 20

## 2023-10-16 MED ORDER — HYDRALAZINE HCL 20 MG/ML IJ SOLN
INTRAMUSCULAR | Status: AC
  Filled 2023-10-16: qty 1

## 2023-10-16 MED ORDER — OXYCODONE HCL 5 MG PO TABS: 5.0000 mg | ORAL_TABLET | Freq: Once | ORAL | Status: DC | PRN

## 2023-10-16 MED ORDER — ROCURONIUM BROMIDE 10 MG/ML (PF) SYRINGE
PREFILLED_SYRINGE | INTRAVENOUS | Status: DC | PRN
  Administered 2023-10-16: 50 mg via INTRAVENOUS

## 2023-10-16 MED ORDER — SUGAMMADEX SODIUM 200 MG/2ML IV SOLN
INTRAVENOUS | Status: DC | PRN
  Administered 2023-10-16: 150 mg via INTRAVENOUS

## 2023-10-16 MED ORDER — OXYCODONE HCL 5 MG/5ML PO SOLN: 5.0000 mg | Freq: Once | ORAL | Status: DC | PRN

## 2023-10-16 MED ORDER — PHENYLEPHRINE HCL (PRESSORS) 10 MG/ML IV SOLN
INTRAVENOUS | Status: AC
Start: 2023-10-16 — End: ?
  Filled 2023-10-16: qty 1

## 2023-10-16 MED ORDER — SODIUM CHLORIDE 0.9 % IV SOLN
INTRAVENOUS | Status: DC | PRN
  Administered 2023-10-16: 10 mL

## 2023-10-16 MED ORDER — FENTANYL CITRATE (PF) 100 MCG/2ML IJ SOLN
INTRAMUSCULAR | Status: AC
  Filled 2023-10-16: qty 2

## 2023-10-16 MED ORDER — LABETALOL HCL 5 MG/ML IV SOLN
5.0000 mg | INTRAVENOUS | Status: DC | PRN
  Administered 2023-10-16: 5 mg via INTRAVENOUS

## 2023-10-16 MED ORDER — FENTANYL CITRATE (PF) 100 MCG/2ML IJ SOLN
25.0000 ug | INTRAMUSCULAR | Status: DC | PRN
  Administered 2023-10-16: 25 ug via INTRAVENOUS

## 2023-10-16 MED ORDER — LACTATED RINGERS IV SOLN: INTRAVENOUS | Status: DC | PRN

## 2023-10-16 MED ORDER — FENTANYL CITRATE (PF) 100 MCG/2ML IJ SOLN
INTRAMUSCULAR | Status: DC | PRN
  Administered 2023-10-16: 50 ug via INTRAVENOUS
  Administered 2023-10-16: 25 ug via INTRAVENOUS
  Administered 2023-10-16: 50 ug via INTRAVENOUS
  Administered 2023-10-16: 25 ug via INTRAVENOUS

## 2023-10-16 MED ORDER — LABETALOL HCL 5 MG/ML IV SOLN
INTRAVENOUS | Status: AC
  Filled 2023-10-16: qty 4

## 2023-10-16 MED ORDER — HYDRALAZINE HCL 20 MG/ML IJ SOLN
5.0000 mg | Freq: Once | INTRAMUSCULAR | Status: AC
  Administered 2023-10-16: 5 mg via INTRAVENOUS

## 2023-10-16 MED ORDER — PHENYLEPHRINE 80 MCG/ML (10ML) SYRINGE FOR IV PUSH (FOR BLOOD PRESSURE SUPPORT)
PREFILLED_SYRINGE | INTRAVENOUS | Status: DC | PRN
  Administered 2023-10-16: 80 ug via INTRAVENOUS

## 2023-10-16 MED ORDER — LIDOCAINE 2% (20 MG/ML) 5 ML SYRINGE
INTRAMUSCULAR | Status: DC | PRN
  Administered 2023-10-16: 80 mg via INTRAVENOUS

## 2023-10-16 MED ORDER — DEXAMETHASONE SODIUM PHOSPHATE 10 MG/ML IJ SOLN
INTRAMUSCULAR | Status: DC | PRN
  Administered 2023-10-16: 4 mg via INTRAVENOUS

## 2023-10-16 MED ORDER — CIPROFLOXACIN IN D5W 400 MG/200ML IV SOLN
INTRAVENOUS | Status: DC | PRN
  Administered 2023-10-16: 400 mg via INTRAVENOUS

## 2023-10-16 NOTE — Progress Notes (Signed)
 Received orders from Dr Isaias Cowman to treat hypertension upon arrival to recovery.

## 2023-10-16 NOTE — Anesthesia Procedure Notes (Signed)
 Procedure Name: Intubation Date/Time: 10/16/2023 10:31 AM  Performed by: Brandy Almarie BROCKS, CRNAPre-anesthesia Checklist: Patient identified, Emergency Drugs available, Suction available and Patient being monitored Patient Re-evaluated:Patient Re-evaluated prior to induction Oxygen Delivery Method: Circle system utilized Preoxygenation: Pre-oxygenation with 100% oxygen Induction Type: IV induction Ventilation: Mask ventilation without difficulty Laryngoscope Size: Mac and 4 Grade View: Grade I Tube type: Oral Tube size: 7.0 mm Number of attempts: 1 Airway Equipment and Method: Stylet Placement Confirmation: ETT inserted through vocal cords under direct vision, positive ETCO2 and breath sounds checked- equal and bilateral Secured at: 22 cm Tube secured with: Tape Dental Injury: Teeth and Oropharynx as per pre-operative assessment

## 2023-10-16 NOTE — Op Note (Signed)
 [facility] Patient Name: Janet Williamson Procedure Date: 10/16/2023 MRN: [id number] Attending MD: Elsie Cree , MD, [id number] Date of Birth: 04/23/1941 CSN: [id number] Age: 83 Admit Type: Outpatient Procedure:                Upper EUS Indications:              Suspected mass in pancreas on CT scan Providers:                Elsie Cree, MD, Ozell Pouch, Haskel Chris, Technician Referring MD:             Dr. Oliva Boots Medicines:                General Anesthesia Complications:            No immediate complications. Estimated Blood Loss:     Estimated blood loss: none. Procedure:                Pre-Anesthesia Assessment:                           - Prior to the procedure, a History and Physical                            was performed, and patient medications and                            allergies were reviewed. The patient's tolerance of                            previous anesthesia was also reviewed. The risks                            and benefits of the procedure and the sedation                            options and risks were discussed with the patient.                            All questions were answered, and informed consent                            was obtained. Prior Anticoagulants: The patient has                            taken no anticoagulant or antiplatelet agents                            except for aspirin . ASA Grade Assessment: IV - A                            patient with severe systemic disease that is a  constant threat to life. After reviewing the risks                            and benefits, the patient was deemed in                            satisfactory condition to undergo the procedure.                           After obtaining informed consent, the endoscope was                            passed under direct vision. Throughout the                             procedure, the patient's blood pressure, pulse, and                            oxygen saturations were monitored continuously. The                            [id number] ([id number] Olympus linear ultrasound scope                            was introduced through the mouth, and advanced to                            the second part of duodenum. The upper EUS was                            accomplished without difficulty. The patient                            tolerated the procedure well. Scope In: Scope Out: Findings:      ENDOSONOGRAPHIC FINDING: :      No lymphadenopathy seen.      There was dilation in the common bile duct which measured up to 20 mm.      Moderate hyperechoic material consistent with sludge was visualized       endosonographically in the common bile duct.      Moderate hyperechoic material consistent with sludge was visualized       endosonographically in the gallbladder.      An irregular mass was identified in the pancreatic head. The mass was       hypoechoic. The mass measured 33 mm by 33 mm in maximal cross-sectional       diameter. The endosonographic borders were poorly-defined. There was       sonographic evidence suggesting invasion into the superior mesenteric       artery (manifested by no vessel visualization). An intact interface was       seen between the mass and the celiac trunk suggesting a lack of       invasion. The remainder of the pancreas was examined. The       endosonographic appearance of parenchyma and the upstream pancreatic       duct indicated duct dilation.  Fine needle aspiration for cytology was       performed. Color Doppler imaging was utilized prior to needle puncture       to confirm a lack of significant vascular structures within the needle       path. Six passes were made with the 25 gauge needle using a       transduodenal approach. A stylet was used. A cytotechnologist was       present to evaluate the adequacy of the specimen.  Final cytology results       are pending. Impression:               - There was dilation in the common bile duct which                            measured up to 20 mm.                           - Hyperechoic material consistent with sludge was                            visualized endosonographically in the common bile                            duct.                           - Hyperechoic material consistent with sludge was                            visualized endosonographically in the gallbladder.                           - A mass was identified in the pancreatic head.                            This was staged T3 N0 Mx by endosonographic                            criteria. Fine needle aspiration performed. Moderate Sedation:      Not Applicable - Patient had care per Anesthesia. Recommendation:           - Perform an ERCP today.                           - Await cytology results. Procedure Code(s):        --- Professional ---                           [phone], Esophagogastroduodenoscopy, flexible,                            transoral; with transendoscopic ultrasound-guided                            intramural or transmural fine needle  aspiration/biopsy(s) (includes endoscopic                            ultrasound examination of the esophagus, stomach,                            and either the duodenum or a surgically altered                            stomach where the jejunum is examined distal to the                            anastomosis) Diagnosis Code(s):        --- Professional ---                           K83.8, Other specified diseases of biliary tract                           K86.89, Other specified diseases of pancreas                           R93.3, Abnormal findings on diagnostic imaging of                            other parts of digestive tract CPT copyright 2022 [employer]. All rights reserved. The codes documented  in this report are preliminary and upon coder review may  be revised to meet current compliance requirements. Elsie Cree, MD 10/16/2023 11:55:11 AM This report has been signed electronically. Number of Addenda: 0

## 2023-10-16 NOTE — Anesthesia Postprocedure Evaluation (Signed)
 Anesthesia Post Note  Patient: Janet Williamson  Procedure(s) Performed: FULL UPPER ENDOSCOPIC ULTRASOUND (EUS) RADIAL ENDOSCOPIC RETROGRADE CHOLANGIOPANCREATOGRAPHY (ERCP) ESOPHAGOGASTRODUODENOSCOPY (EGD) WITH PROPOFOL  FINE NEEDLE ASPIRATION (FNA) LINEAR SPHINCTEROTOMY BILIARY BRUSHING BILIARY STENT PLACEMENT     Patient location during evaluation: PACU Anesthesia Type: General Level of consciousness: awake Pain management: pain level controlled Vital Signs Assessment: post-procedure vital signs reviewed and stable Respiratory status: spontaneous breathing, nonlabored ventilation and respiratory function stable Cardiovascular status: blood pressure returned to baseline and stable Postop Assessment: no apparent nausea or vomiting Anesthetic complications: no   No notable events documented.  Last Vitals:  Vitals:   10/16/23 1320 10/16/23 1330  BP: (!) 176/62 (!) 174/63  Pulse: 73 84  Resp: 11 15  Temp:    SpO2: 95% 96%    Last Pain:  Vitals:   10/16/23 1320  TempSrc:   PainSc: 1                  Delon Aisha Arch

## 2023-10-16 NOTE — H&P (Signed)
 [facility] H/P Note  Chief Complaint: pancreatic mass  HPI: Janet Williamson is an 83 y.o. female.  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  Last LFTs elevated ALP few weeks ago, repeat today pending.    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 Not seen on most recent TEE 2019.   Cardiomyopathy in other disease    Cataracts, bilateral    Diabetes mellitus    Type 2   Generalized osteoarthritis    GERD (gastroesophageal reflux disease)    HLD (hyperlipidemia)    Hypertension    Hypothyroidism    LBBB (left bundle branch block)    PAF (paroxysmal atrial fibrillation) (HCC)    a. s/p DCCV 06/2017, on Xarelto    Phlebitis    S/P TAVR (transcatheter aortic valve replacement) 04/15/2018   Edwards Sapien 3 THV (size 23 mm, model # 9600TFX, serial # [id number]) via the TF approach   Severe aortic stenosis    a. s/p TAVR 04/2018.   Stroke [facility]) 2008    Past Surgical History:  Procedure Laterality Date   ABDOMINAL HYSTERECTOMY     BIOPSY  05/19/2019   Procedure: BIOPSY;  Surgeon: Donnald Charleston, MD;  Location: [facility] ENDOSCOPY;  Service: Endoscopy;;   BREAST BIOPSY Left    years ago at a doctor's office   COLONOSCOPY     COLONOSCOPY WITH PROPOFOL  N/A 10/02/2018   Procedure: COLONOSCOPY WITH PROPOFOL ;  Surgeon: Janet Fallow, MD;  Location: [facility] ENDOSCOPY;  Service: Endoscopy;  Laterality: N/A;   ENTEROSCOPY N/A 05/19/2019   Procedure: ENTEROSCOPY;  Surgeon: Donnald Charleston, MD;  Location: [facility] ENDOSCOPY;  Service: Endoscopy;  Laterality: N/A;   ESOPHAGOGASTRODUODENOSCOPY (EGD) WITH PROPOFOL  N/A 10/02/2018   Procedure: ESOPHAGOGASTRODUODENOSCOPY (EGD) WITH PROPOFOL ;  Surgeon: Janet Fallow, MD;  Location: [facility] ENDOSCOPY;  Service: Endoscopy;  Laterality: N/A;   ESOPHAGOGASTRODUODENOSCOPY (EGD) WITH PROPOFOL  N/A 04/23/2019   Procedure: ESOPHAGOGASTRODUODENOSCOPY (EGD) WITH  PROPOFOL ;  Surgeon: Lennard Lesta FALCON, MD;  Location: [facility] ENDOSCOPY;  Service: Endoscopy;  Laterality: N/A;   EYE SURGERY Bilateral    cataract removal   GIVENS CAPSULE STUDY N/A 10/02/2018   Procedure: GIVENS CAPSULE STUDY;  Surgeon: Janet Fallow, MD;  Location: [facility] ENDOSCOPY;  Service: Endoscopy;  Laterality: N/A;   RIGHT/LEFT HEART CATH AND CORONARY ANGIOGRAPHY N/A 03/06/2018   Procedure: RIGHT/LEFT HEART CATH AND CORONARY ANGIOGRAPHY;  Surgeon: Claudene Victory ORN, MD;  Location: [facility] INVASIVE CV LAB;  Service: Cardiovascular;  Laterality: N/A;   SMALL BOWEL ENTEROSCOPY  05/19/2019   TEE WITHOUT CARDIOVERSION N/A 04/01/2018   Procedure: TRANSESOPHAGEAL ECHOCARDIOGRAM (TEE);  Surgeon: Jeffrie Oneil BROCKS, MD;  Location: [facility] ENDOSCOPY;  Service: Cardiovascular;  Laterality: N/A;   TEE WITHOUT CARDIOVERSION N/A 04/15/2018   Procedure: TRANSESOPHAGEAL ECHOCARDIOGRAM (TEE);  Surgeon: Wonda Sharper, MD;  Location: [facility] OR;  Service: Open Heart Surgery;  Laterality: N/A;   TOTAL KNEE ARTHROPLASTY Right 04/14/2013   Dr Addie   TOTAL KNEE ARTHROPLASTY Right 04/14/2013   Procedure: TOTAL KNEE ARTHROPLASTY;  Surgeon: Cordella Glendia Addie, MD;  Location: [facility] OR;  Service: Orthopedics;  Laterality: Right;   TRANSCATHETER AORTIC VALVE REPLACEMENT, TRANSFEMORAL  04/15/2018   TRANSCATHETER AORTIC VALVE REPLACEMENT, TRANSFEMORAL Bilateral 04/15/2018   Procedure: TRANSCATHETER AORTIC VALVE REPLACEMENT, TRANSFEMORAL;  Surgeon: Wonda Sharper, MD;  Location: [facility] OR;  Service: Open Heart Surgery;  Laterality: Bilateral;    Medications Prior to Admission  Medication  Sig Dispense Refill   amLODipine  (NORVASC ) 5 MG tablet Take 5 mg by mouth daily.     Blood Glucose Monitoring Suppl (ACCU-CHEK GUIDE ME) w/Device KIT by Does not apply route.     Blood Glucose Monitoring Suppl (ONE TOUCH ULTRA 2) w/Device KIT Use to check blood sugars three times daily DX: E11.9 1 kit 0   Cholecalciferol  (VITAMIN D3) 5000 units CAPS Take 5,000 Units by  mouth daily.     hydrochlorothiazide  (HYDRODIURIL ) 25 MG tablet Take 25 mg by mouth daily.      metFORMIN  (GLUCOPHAGE -XR) 750 MG 24 hr tablet TAKE 2 TABLETS ONE TIME DAILY WITH BREAKFAST 180 tablet 2   metoprolol  succinate (TOPROL -XL) 50 MG 24 hr tablet Take 50 mg by mouth daily.      ONETOUCH ULTRA TEST test strip TEST BLOOD SUGAR THREE TIMES DAILY 300 strip 3   simvastatin  (ZOCOR ) 20 MG tablet Take 20 mg by mouth every evening.     acetaminophen  (TYLENOL ) 325 MG tablet Take 325-650 mg by mouth every 8 (eight) hours as needed (for headaches).     hydrOXYzine (ATARAX/VISTARIL) 25 MG tablet Take 25 mg by mouth 3 (three) times daily as needed.     Lancets (ONETOUCH DELICA PLUS LANCET33G) MISC TEST BLOOD SUGAR THREE TIMES DAILY 300 each 10   Polyethyl Glycol-Propyl Glycol (SYSTANE OP) Place 1 drop into both eyes 2 (two) times daily.       Allergies:  Allergies  Allergen Reactions   Ace Inhibitors Swelling and Other (See Comments)    Angioedema    Keflex [Cephalexin] Swelling and Other (See Comments)    Lips became swollwn   Rifadin [Rifampin] Swelling and Other (See Comments)    Lips became swollen    Family History  Problem Relation Age of Onset   Stroke Mother    Hypertension Mother    Hypertension Father    Heart attack Neg Hx     Social History:  reports that she has never smoked. She has never used smokeless tobacco. She reports that she does not drink alcohol and does not use drugs.   ROS: As per HPI, all others negative   Blood pressure (!) 172/68, pulse 65, temperature 98.3 F (36.8 C), temperature source Oral, resp. rate 15, height 5' 3 (1.6 m), weight 72.6 kg, SpO2 98%. General appearance: NAD HEENT:  [place]/AT, jaundiced sclera LUNGS:  No visible distress ABD: soft, mild tenderness NEURO:  No encephalopathy  Results for orders placed or performed during the hospital encounter of 10/16/23 (from the past 48 hours)  Glucose, capillary     Status: Abnormal    Collection Time: 10/16/23  9:40 AM  Result Value Ref Range   Glucose-Capillary 149 (H) 70 - 99 mg/dL    Comment: Glucose reference range applies only to samples taken after fasting for at least 8 hours.   No results found.  Assessment/Plan   Pancreatic mass with impending biliary obstruction. Upper endoscopic ultrasound with fine need aspiration. Pending liver test results today, possible ERCP to follow. Risks (bleeding, infection, bowel perforation that could require surgery, sedation-related changes in cardiopulmonary systems), benefits (identification and possible treatment of source of symptoms, exclusion of certain causes of symptoms), and alternatives (watchful waiting, radiographic imaging studies, empiric medical treatment) of upper endoscopy with ultrasound and possible fine needle aspiration (EUS +/- FNA) were explained to patient/family in detail and patient wishes to proceed.  Risks (up to and including bleeding, infection, perforation, pancreatitis that can be complicated by infected necrosis  and death), benefits (removal of stones, alleviating blockage, decreasing risk of cholangitis or choledocholithiasis-related pancreatitis), and alternatives (watchful waiting, percutaneous transhepatic cholangiography) of ERCP were explained to patient/family in detail and patient elects to proceed.   Janet Williamson 10/16/2023, 10:28 AM

## 2023-10-16 NOTE — Transfer of Care (Signed)
 Immediate Anesthesia Transfer of Care Note  Patient: Kenly P Demaree  Procedure(s) Performed: FULL UPPER ENDOSCOPIC ULTRASOUND (EUS) RADIAL ENDOSCOPIC RETROGRADE CHOLANGIOPANCREATOGRAPHY (ERCP) ESOPHAGOGASTRODUODENOSCOPY (EGD) WITH PROPOFOL  FINE NEEDLE ASPIRATION (FNA) LINEAR SPHINCTEROTOMY BILIARY BRUSHING BILIARY STENT PLACEMENT  Patient Location: PACU  Anesthesia Type:General  Level of Consciousness: awake, alert , and oriented  Airway & Oxygen Therapy: Patient Spontanous Breathing and Patient connected to face mask oxygen  Post-op Assessment: Report given to RN, Post -op Vital signs reviewed and stable, and Patient moving all extremities X 4  Post vital signs: Reviewed and stable  Last Vitals:  Vitals Value Taken Time  BP 207/96   Temp    Pulse 96   Resp 12   SpO2 100     Last Pain:  Vitals:   10/16/23 0926  TempSrc: Oral  PainSc: 0-No pain         Complications: No notable events documented.

## 2023-10-16 NOTE — Discharge Instructions (Addendum)
 Call if GI question or problem otherwise call for biopsy report in 5 days and begin with clear liquids and if no problem at 6 PM may have soft solids and if doing okay in a.m. may slowly advance diet and when biopsies returned will probably refer to oncology at that point  YOU HAD AN ENDOSCOPIC PROCEDURE TODAY: Refer to the procedure report and other information in the discharge instructions given to you for any specific questions about what was found during the examination. If this information does not answer your questions, please call [facility] office at [phone] to clarify.   YOU SHOULD EXPECT: Some feelings of bloating in the abdomen. Passage of more gas than usual. Walking can help get rid of the air that was put into your GI tract during the procedure and reduce the bloating. If you had a lower endoscopy (such as a colonoscopy or flexible sigmoidoscopy) you may notice spotting of blood in your stool or on the toilet paper. Some abdominal soreness may be present for a day or two, also.  DIET: Your first meal following the procedure should be a light meal and then it is ok to progress to your normal diet. A half-sandwich or bowl of soup is an example of a good first meal. Heavy or fried foods are harder to digest and may make you feel nauseous or bloated. Drink plenty of fluids but you should avoid alcoholic beverages for 24 hours. If you had a esophageal dilation, please see attached instructions for diet.    ACTIVITY: Your care partner should take you home directly after the procedure. You should plan to take it easy, moving slowly for the rest of the day. You can resume normal activity the day after the procedure however YOU SHOULD NOT DRIVE, use power tools, machinery or perform tasks that involve climbing or major physical exertion for 24 hours (because of the sedation medicines used during the test).   SYMPTOMS TO REPORT IMMEDIATELY: A gastroenterologist can be reached at any hour. Please  call [phone]  for any of the following symptoms:  Following upper endoscopy (EGD, EUS, ERCP, esophageal dilation) Vomiting of blood or coffee ground material  New, significant abdominal pain  New, significant chest pain or pain under the shoulder blades  Painful or persistently difficult swallowing  New shortness of breath  Black, tarry-looking or red, bloody stools  FOLLOW UP:  If any biopsies were taken you will be contacted by phone or by letter within the next 1-3 weeks. Call [phone]  if you have not heard about the biopsies in 3 weeks.  Please also call with any specific questions about appointments or follow up tests. YOU HAD AN ENDOSCOPIC PROCEDURE TODAY: Refer to the procedure report and other information in the discharge instructions given to you for any specific questions about what was found during the examination. If this information does not answer your questions, please call [facility] office at [phone] to clarify.

## 2023-10-16 NOTE — Op Note (Signed)
 [facility] Patient Name: Janet Williamson Procedure Date: 10/16/2023 MRN: [id number] Attending MD: Oliva Boots , MD, [id number] Date of Birth: 1941/01/10 CSN: [id number] Age: 83 Admit Type: Outpatient Procedure:                ERCP Indications:              Abnormal abdominal CT, Elevated liver enzymes,                            Tumor of the head of pancreas Providers:                Ozell Pouch, Haskel Chris, Technician, Oliva Boots, MD Referring MD:              Medicines:                General Anesthesia Complications:            No immediate complications. Estimated Blood Loss:     Estimated blood loss: none. Procedure:                Pre-Anesthesia Assessment:                           - Prior to the procedure, a History and Physical                            was performed, and patient medications and                            allergies were reviewed. The patient's tolerance of                            previous anesthesia was also reviewed. The risks                            and benefits of the procedure and the sedation                            options and risks were discussed with the patient.                            All questions were answered, and informed consent                            was obtained. Prior Anticoagulants: The patient has                            taken no anticoagulant or antiplatelet agents                            except for aspirin . ASA Grade Assessment: IV - A                            patient  with severe systemic disease that is a                            constant threat to life. After reviewing the risks                            and benefits, the patient was deemed in                            satisfactory condition to undergo the procedure.                           - Prior to the procedure, a History and Physical                            was performed, and patient medications  and                            allergies were reviewed. The patient's tolerance of                            previous anesthesia was also reviewed. The risks                            and benefits of the procedure and the sedation                            options and risks were discussed with the patient.                            All questions were answered, and informed consent                            was obtained. Prior Anticoagulants: The patient has                            taken no anticoagulant or antiplatelet agents. ASA                            Grade Assessment: III - A patient with severe                            systemic disease. After reviewing the risks and                            benefits, the patient was deemed in satisfactory                            condition to undergo the procedure.                           After obtaining informed consent, the scope was  passed under direct vision. Throughout the                            procedure, the patient's blood pressure, pulse, and                            oxygen saturations were monitored continuously. The                            [id number]-[id number] ([id number] Olympus duodenoscope was                            introduced through the mouth, and used to inject                            contrast into and used to cannulate the bile duct.                            The ERCP was accomplished without difficulty. The                            patient tolerated the procedure well. Scope In: Scope Out: Findings:      The major papilla was normal. Deep selective cannulation was readily       obtained and there was no pancreatic duct injection or wire advancement       throughout the procedure and obvious distal CBD stricture was confirmed       and we proceeded with a small to medium biliary sphincterotomy which was       made with a Hydratome sphincterotome using ERBE electrocautery. There        was no post-sphincterotomy bleeding. Cells for cytology were obtained by       brushing in the lower third of the main bile duct since the EUS biopsies       were not available on Quik stain. One 10 Fr by 6 cm uncovered metal       biliary stent with no external flaps and no internal flaps was placed       5.5 cm into the common bile duct. Bile flowed through the stent. The       stent was in good position. The wire and introducer were removed and the       scope was removed and the patient tolerated the procedure well there was       no obvious immediate complication Impression:               - The major papilla appeared normal.                           - A biliary sphincterotomy was performed.                           - Cells for cytology obtained in the lower third of                            the main duct.                           -  One uncovered metal biliary stent was placed into                            the common bile duct. Moderate Sedation:      Not Applicable - Patient had care per Anesthesia. Recommendation:           - Clear liquid diet for 6 hours. If doing well at 6                            PM tonight may have soft solids                           - Continue present medications.                           - Await cytology results and await path results.                            Probable referral to oncologist as soon as path                            returns                           - Return to GI clinic PRN.                           - Telephone GI clinic for pathology results in 5                            days.                           - Telephone GI clinic if symptomatic PRN. Follow                            liver tests back to normal or near normal as an                            outpatient probably per oncology Procedure Code(s):        --- Professional ---                           [phone], Endoscopic retrograde                             cholangiopancreatography (ERCP); with placement of                            endoscopic stent into biliary or pancreatic duct,                            including pre- and post-dilation and guide wire  passage, when performed, including sphincterotomy,                            when performed, each stent Diagnosis Code(s):        --- Professional ---                           R74.8, Abnormal levels of other serum enzymes                           D49.0, Neoplasm of unspecified behavior of                            digestive system                           R93.5, Abnormal findings on diagnostic imaging of                            other abdominal regions, including retroperitoneum CPT copyright 2022 [employer]. All rights reserved. The codes documented in this report are preliminary and upon coder review may  be revised to meet current compliance requirements. Oliva Boots, MD 10/16/2023 12:22:24 PM This report has been signed electronically. Number of Addenda: 0

## 2023-10-18 ENCOUNTER — Encounter (HOSPITAL_COMMUNITY): Payer: Self-pay | Admitting: Gastroenterology

## 2023-10-23 DIAGNOSIS — E042 Nontoxic multinodular goiter: Secondary | ICD-10-CM | POA: Diagnosis not present

## 2023-10-23 DIAGNOSIS — I350 Nonrheumatic aortic (valve) stenosis: Secondary | ICD-10-CM | POA: Diagnosis not present

## 2023-10-23 DIAGNOSIS — M8588 Other specified disorders of bone density and structure, other site: Secondary | ICD-10-CM | POA: Diagnosis not present

## 2023-10-23 DIAGNOSIS — E785 Hyperlipidemia, unspecified: Secondary | ICD-10-CM | POA: Diagnosis not present

## 2023-10-23 DIAGNOSIS — I639 Cerebral infarction, unspecified: Secondary | ICD-10-CM | POA: Diagnosis not present

## 2023-10-23 DIAGNOSIS — E1169 Type 2 diabetes mellitus with other specified complication: Secondary | ICD-10-CM | POA: Diagnosis not present

## 2023-10-23 DIAGNOSIS — Z0000 Encounter for general adult medical examination without abnormal findings: Secondary | ICD-10-CM | POA: Diagnosis not present

## 2023-10-23 DIAGNOSIS — D649 Anemia, unspecified: Secondary | ICD-10-CM | POA: Diagnosis not present

## 2023-10-23 DIAGNOSIS — E118 Type 2 diabetes mellitus with unspecified complications: Secondary | ICD-10-CM | POA: Diagnosis not present

## 2023-10-23 DIAGNOSIS — I10 Essential (primary) hypertension: Secondary | ICD-10-CM | POA: Diagnosis not present

## 2023-10-23 DIAGNOSIS — R748 Abnormal levels of other serum enzymes: Secondary | ICD-10-CM | POA: Diagnosis not present

## 2023-10-24 ENCOUNTER — Inpatient Hospital Stay: Payer: Medicare HMO

## 2023-10-24 ENCOUNTER — Inpatient Hospital Stay: Payer: Medicare HMO | Attending: Nurse Practitioner | Admitting: Nurse Practitioner

## 2023-10-24 VITALS — BP 149/68 | HR 71 | Temp 98.0°F | Resp 16 | Ht 63.0 in | Wt 161.8 lb

## 2023-10-24 DIAGNOSIS — C250 Malignant neoplasm of head of pancreas: Secondary | ICD-10-CM | POA: Diagnosis not present

## 2023-10-24 DIAGNOSIS — I10 Essential (primary) hypertension: Secondary | ICD-10-CM | POA: Diagnosis not present

## 2023-10-24 DIAGNOSIS — Z7984 Long term (current) use of oral hypoglycemic drugs: Secondary | ICD-10-CM | POA: Insufficient documentation

## 2023-10-24 DIAGNOSIS — Z79899 Other long term (current) drug therapy: Secondary | ICD-10-CM | POA: Diagnosis not present

## 2023-10-24 DIAGNOSIS — E119 Type 2 diabetes mellitus without complications: Secondary | ICD-10-CM | POA: Insufficient documentation

## 2023-10-24 DIAGNOSIS — E039 Hypothyroidism, unspecified: Secondary | ICD-10-CM | POA: Diagnosis not present

## 2023-10-24 NOTE — Progress Notes (Addendum)
[facility]   [phone] Fax:(336) [phone]   Clinic New Consult Note   Patient Care Team: Irven Coe, MD as PCP - General (Family Medicine) Jake Bathe, MD as PCP - Cardiology (Cardiology) 10/24/2023  CHIEF COMPLAINTS/PURPOSE OF CONSULTATION:  Pancreatic cancer, referred by Dr. Dulce Sellar  History of Present Illness    Janet Williamson is an 83 year old female with PMH including HTN, DM, arthritis and h/o stroke in 2008 is here because of newly diagnosed pancreatic cancer.  She had an episode of abdominal pain, nausea, and dizziness at church and came to ED 08/18/2023, labs were reassuring and she felt better, imaging was not done.  She followed up with PCP who ordered CT AP done 10/08/2023 showing a stable solid right lower lobe pulmonary nodule from 2019 and an ill-defined hypodense lesion in the pancreatic head measuring 4.4 x 3.9 x 3.3 cm obstructing the pancreatic and CBD appearing to encase and occlude the SMV and portal confluence with soft tissue stranding extending along the mesenteric root with nodular foci peritoneal soft tissue nodularity anterior to the mass; overall concerning for primary pancreatic malignancy with peritoneal carcinomatosis.  She underwent EUS 10/16/23 by Dr. Dulce Sellar showing a T3 N0 pancreatic head mass invading the SMA and ERCP with stenting by Dr. Ewing Schlein, cytology confirming adenocarcinoma. Repeat labs at PCP 10/23/23 showed improving LFTs and normal Tbili.   Socially, she is single, lives alone in a 1 level home, independent with ADLs, drives.  Denies recent fall.  7 out of 9 children are living.  Denies family history of cancer.  Denies alcohol, tobacco, or drug use.  Today she presents with one of her children.  She notes a 20+ pound weight loss since 01/2023, including 10 lbs lost in the past 3 months.  Has a good appetite.  Denies abdominal pain, nausea/vomiting.  Bowels moving normally.  Denies fever, chills, chest pain, dyspnea, leg  edema.  She has a mild cough from the EUS.  She reports her medical conditions are controlled, no significant deficits from previous stroke except some knee and leg weakness she feels is more from arthritis.  In her free time she goes to church, visits friends, or goes out of town.     MEDICAL HISTORY:  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 Not seen on most recent TEE 2019.   Cardiomyopathy in other disease    Cataracts, bilateral    Diabetes mellitus    Type 2   Generalized osteoarthritis    GERD (gastroesophageal reflux disease)    HLD (hyperlipidemia)    Hypertension    Hypothyroidism    LBBB (left bundle branch block)    PAF (paroxysmal atrial fibrillation) (HCC)    a. s/p DCCV 06/2017, on Xarelto   Phlebitis    S/P TAVR (transcatheter aortic valve replacement) 04/15/2018   Edwards Sapien 3 THV (size 23 mm, model # 9600TFX, serial # [id number]) via the TF approach   Severe aortic stenosis    a. s/p TAVR 04/2018.   Stroke [facility]) 2008    SURGICAL HISTORY: Past Surgical History:  Procedure Laterality Date   ABDOMINAL HYSTERECTOMY     BILIARY BRUSHING  10/16/2023   Procedure: BILIARY BRUSHING;  Surgeon: [name], MD;  Location: [facility] ENDOSCOPY;  Service: Gastroenterology;;   BILIARY STENT PLACEMENT N/A 10/16/2023   Procedure: BILIARY STENT PLACEMENT;  Surgeon: [name], MD;  Location: [facility] ENDOSCOPY;  Service: Gastroenterology;  Laterality: N/A;   BIOPSY  05/19/2019   Procedure: BIOPSY;  Surgeon: Bernette Redbird, MD;  Location: [facility] ENDOSCOPY;  Service: Endoscopy;;   BREAST BIOPSY Left    years ago at "a doctor's office"   COLONOSCOPY     COLONOSCOPY WITH PROPOFOL N/A 10/02/2018   Procedure: COLONOSCOPY WITH PROPOFOL;  Surgeon: [name], MD;  Location: [facility] ENDOSCOPY;  Service: Endoscopy;  Laterality: N/A;   ENTEROSCOPY N/A 05/19/2019   Procedure: ENTEROSCOPY;   Surgeon: Bernette Redbird, MD;  Location: [facility] ENDOSCOPY;  Service: Endoscopy;  Laterality: N/A;   ERCP N/A 10/16/2023   Procedure: ENDOSCOPIC RETROGRADE CHOLANGIOPANCREATOGRAPHY (ERCP);  Surgeon: [name], MD;  Location: [facility] ENDOSCOPY;  Service: Gastroenterology;  Laterality: N/A;   ESOPHAGOGASTRODUODENOSCOPY (EGD) WITH PROPOFOL N/A 10/02/2018   Procedure: ESOPHAGOGASTRODUODENOSCOPY (EGD) WITH PROPOFOL;  Surgeon: [name], MD;  Location: [facility] ENDOSCOPY;  Service: Endoscopy;  Laterality: N/A;   ESOPHAGOGASTRODUODENOSCOPY (EGD) WITH PROPOFOL N/A 04/23/2019   Procedure: ESOPHAGOGASTRODUODENOSCOPY (EGD) WITH PROPOFOL;  Surgeon: Graylin Shiver, MD;  Location: [facility] ENDOSCOPY;  Service: Endoscopy;  Laterality: N/A;   ESOPHAGOGASTRODUODENOSCOPY (EGD) WITH PROPOFOL N/A 10/16/2023   Procedure: ESOPHAGOGASTRODUODENOSCOPY (EGD) WITH PROPOFOL;  Surgeon: [name], MD;  Location: [facility] ENDOSCOPY;  Service: Gastroenterology;  Laterality: N/A;   EUS N/A 10/16/2023   Procedure: FULL UPPER ENDOSCOPIC ULTRASOUND (EUS) RADIAL;  Surgeon: [name], MD;  Location: [facility] ENDOSCOPY;  Service: Gastroenterology;  Laterality: N/A;   EYE SURGERY Bilateral    cataract removal   FINE NEEDLE ASPIRATION N/A 10/16/2023   Procedure: FINE NEEDLE ASPIRATION (FNA) LINEAR;  Surgeon: [name], MD;  Location: [facility] ENDOSCOPY;  Service: Gastroenterology;  Laterality: N/A;   GIVENS CAPSULE STUDY N/A 10/02/2018   Procedure: GIVENS CAPSULE STUDY;  Surgeon: [name], MD;  Location: [facility] ENDOSCOPY;  Service: Endoscopy;  Laterality: N/A;   RIGHT/LEFT HEART CATH AND CORONARY ANGIOGRAPHY N/A 03/06/2018   Procedure: RIGHT/LEFT HEART CATH AND CORONARY ANGIOGRAPHY;  Surgeon: Lyn Records, MD;  Location: [facility] INVASIVE CV LAB;  Service: Cardiovascular;  Laterality: N/A;   SMALL BOWEL ENTEROSCOPY  05/19/2019   SPHINCTEROTOMY  10/16/2023   Procedure: SPHINCTEROTOMY;  Surgeon: [name], MD;  Location: [facility] ENDOSCOPY;  Service:  Gastroenterology;;   TEE WITHOUT CARDIOVERSION N/A 04/01/2018   Procedure: TRANSESOPHAGEAL ECHOCARDIOGRAM (TEE);  Surgeon: Jake Bathe, MD;  Location: [facility] ENDOSCOPY;  Service: Cardiovascular;  Laterality: N/A;   TEE WITHOUT CARDIOVERSION N/A 04/15/2018   Procedure: TRANSESOPHAGEAL ECHOCARDIOGRAM (TEE);  Surgeon: Tonny Bollman, MD;  Location[facility] OR;  Service: Open Heart Surgery;  Laterality: N/A;   TOTAL KNEE ARTHROPLASTY Right 04/14/2013   Dr August Saucer   TOTAL KNEE ARTHROPLASTY Right 04/14/2013   Procedure: TOTAL KNEE ARTHROPLASTY;  Surgeon: Cammy Copa, MD;  Location: [facility] OR;  Service: Orthopedics;  Laterality: Right;   TRANSCATHETER AORTIC VALVE REPLACEMENT, TRANSFEMORAL  04/15/2018   TRANSCATHETER AORTIC VALVE REPLACEMENT, TRANSFEMORAL Bilateral 04/15/2018   Procedure: TRANSCATHETER AORTIC VALVE REPLACEMENT, TRANSFEMORAL;  Surgeon: Tonny Bollman, MD;  Location: [facility] OR;  Service: Open Heart Surgery;  Laterality: Bilateral;    SOCIAL HISTORY: Social History   Socioeconomic History   Marital status: Widowed    Spouse name: Not on file   Number of children: Not on file   Years of education: Not on file   Highest education level: Not on file  Occupational History   Not on file  Tobacco Use   Smoking status: Never   Smokeless tobacco: Never  Vaping Use   Vaping status: Never Used  Substance and Sexual Activity   Alcohol use: No   Drug use: No   Sexual activity: Not Currently  Other Topics Concern   Not on file  Social History Narrative   Not on file   Social Drivers of Health   Financial Resource Strain: Not on file  Food Insecurity: Not on file  Transportation Needs: Not on file  Physical Activity: Not on file  Stress: Not on file  Social Connections: Not on file  Intimate Partner Violence: Not on file    FAMILY HISTORY: Family History  Problem Relation Age of Onset   Stroke Mother    Hypertension Mother    Hypertension Father    Heart attack Neg Hx      ALLERGIES:  is allergic to ace inhibitors, keflex [cephalexin], and rifadin [rifampin].  MEDICATIONS:  Current Outpatient Medications  Medication Sig Dispense Refill   acetaminophen (TYLENOL) 325 MG tablet Take 325-650 mg by mouth every 8 (eight) hours as needed (for headaches).     amLODipine (NORVASC) 5 MG tablet Take 5 mg by mouth daily.     Blood Glucose Monitoring Suppl (ACCU-CHEK GUIDE ME) w/Device KIT by Does not apply route.     Blood Glucose Monitoring Suppl (ONE TOUCH ULTRA 2) w/Device KIT Use to check blood sugars three times daily DX: E11.9 1 kit 0   Cholecalciferol (VITAMIN D3) 5000 units CAPS Take 5,000 Units by mouth daily.     hydrochlorothiazide (HYDRODIURIL) 25 MG tablet Take 25 mg by mouth daily.      hydrOXYzine (ATARAX/VISTARIL) 25 MG tablet Take 25 mg by mouth 3 (three) times daily as needed.     Lancets (ONETOUCH DELICA PLUS LANCET33G) MISC TEST BLOOD SUGAR THREE TIMES DAILY 300 each 10   metFORMIN (GLUCOPHAGE-XR) 750 MG 24 hr tablet TAKE 2 TABLETS ONE TIME DAILY WITH BREAKFAST 180 tablet 2   metoprolol succinate (TOPROL-XL) 50 MG 24 hr tablet Take 50 mg by mouth daily.      ONETOUCH ULTRA TEST test strip TEST BLOOD SUGAR THREE TIMES DAILY 300 strip 3   Polyethyl Glycol-Propyl Glycol (SYSTANE OP) Place 1 drop into both eyes 2 (two) times daily.      simvastatin (ZOCOR) 20 MG tablet Take 20 mg by mouth every evening.     No current facility-administered medications for this visit.    REVIEW OF SYSTEMS:   Constitutional: Denies fevers, chills or abnormal night sweats (+) unintentional weight loss Eyes: Denies blurriness of vision, double vision or watery eyes Ears, nose, mouth, throat, and face: Denies mucositis or sore throat Respiratory: Denies cough, dyspnea or wheezes Cardiovascular: Denies palpitation, chest discomfort or lower extremity swelling (+) of stroke 2008 Gastrointestinal:  Denies nausea, heartburn or change in bowel habits Skin: Denies abnormal  skin rashes Lymphatics: Denies new lymphadenopathy or easy bruising Neurological:Denies numbness, tingling or new weaknesses Behavioral/Psych: Mood is stable, no new changes  All other systems were reviewed with the patient and are negative.  PHYSICAL EXAMINATION: ECOG PERFORMANCE STATUS: 1 - Symptomatic but completely ambulatory  Vitals:   10/24/23 1303  BP: (!) 149/68  Pulse: 71  Resp: 16  Temp: 98 F (36.7 C)  SpO2: 98%   Filed Weights   10/24/23 1303  Weight: 161 lb 12.8 oz (73.4 kg)    GENERAL:alert, no distress and comfortable SKIN: no rashes or significant lesions EYES: sclera clear  LYMPH:  no palpable cervical or supraclavicular lymphadenopathy  LUNGS: clear with normal  breathing effort HEART: regular rate & rhythm, no lower extremity edema ABDOMEN:abdomen soft, non-tender and normal bowel sounds Musculoskeletal:no cyanosis of digits and no clubbing  PSYCH: alert & oriented x 3 with fluent speech NEURO: no focal motor/sensory deficits    LABORATORY DATA:  I have reviewed the data as listed    Latest Ref Rng & Units 10/16/2023   10:08 AM 08/18/2023    1:47 PM 05/19/2019    2:12 AM  CBC  WBC 4.0 - 10.5 K/uL 6.5  8.2  8.0   Hemoglobin 12.0 - 15.0 g/dL 78.2  95.6  8.5   Hematocrit 36.0 - 46.0 % 33.4  35.1  28.4   Platelets 150 - 400 K/uL 278  343  366       Latest Ref Rng & Units 10/16/2023   10:08 AM 08/18/2023    1:47 PM 01/15/2023    8:59 AM  CMP  Glucose 70 - 99 mg/dL 213  086  578   BUN 8 - 23 mg/dL 9  13  16    Creatinine 0.44 - 1.00 mg/dL 4.69  6.29  5.28   Sodium 135 - 145 mmol/L 138  136  138   Potassium 3.5 - 5.1 mmol/L 3.4  4.0  3.9   Chloride 98 - 111 mmol/L 101  101  98   CO2 22 - 32 mmol/L 28  27  31    Calcium 8.9 - 10.3 mg/dL 9.7  9.9  41.3   Total Protein 6.5 - 8.1 g/dL 7.3  7.6  8.0   Total Bilirubin 0.0 - 1.2 mg/dL 2.8  0.4  0.3   Alkaline Phos 38 - 126 U/L 513  62  66   AST 15 - 41 U/L 236  17  16   ALT 0 - 44 U/L 233  12  9        RADIOGRAPHIC STUDIES: I have personally reviewed the radiological images as listed and agreed with the findings in the report. DG ERCP Result Date: 10/16/2023 CLINICAL DATA:  Pancreatic lesion with extrahepatic biliary obstruction EXAM: ERCP TECHNIQUE: Multiple spot images obtained with the fluoroscopic device and submitted for interpretation post-procedure. COMPARISON:  CT 10/08/2023 FINDINGS: Series of fluoroscopic spot images document endoscopic cannulation and opacification of the biliary tree. Dilated CBD and central intrahepatic bile ducts. Images document placement of a biliary stent in the CBD with tapered narrowing in its distal portion. IMPRESSION: Endoscopic CBD cannulation and intervention as above. These images were submitted for radiologic interpretation only. Please see the procedural report for the amount of contrast and the fluoroscopy time utilized. Electronically Signed   By: Corlis Leak M.D.   On: 10/16/2023 16:59   CT ABDOMEN PELVIS W CONTRAST Result Date: 10/12/2023 CLINICAL DATA:  Abdominal pain and diarrhea x1 month. * Tracking Code: BO * EXAM: CT ABDOMEN AND PELVIS WITH CONTRAST TECHNIQUE: Multidetector CT imaging of the abdomen and pelvis was performed using the standard protocol following bolus administration of intravenous contrast. RADIATION DOSE REDUCTION: This exam was performed according to the departmental dose-optimization program which includes automated exposure control, adjustment of the mA and/or kV according to patient size and/or use of iterative reconstruction technique. CONTRAST:  ISOVUE-300 IOPAMIDOL (ISOVUE-300) INJECTION 61% COMPARISON:  CT April 09, 2018 FINDINGS: Lower chest: 10 mm solid right lower lobe pulmonary nodule on image 24/5, stable dating back to April 09, 2018 compatible with a benign finding. Aortic valve prosthesis. Calcifications of the mitral annulus. Hepatobiliary: No suspicious hepatic lesion. Severe intra  and extrahepatic biliary ductal  dilation to the level of the pancreatic head. Sludge in a distended gallbladder. Pancreas: Severe pancreatic ductal dilation with distal pancreatic atrophy. Aggressive ill-defined hypodense lesion in the pancreatic head measures 4.4 x 3.9 by 3.3 cm on images 33/2 and 37/3. Although difficult to definitively assess given contrast timing on this examination the lesion appears to encase and occlude the SMV and SMV/portal confluence for instance on image 32/2. Spleen: No splenomegaly or focal splenic lesion. Adrenals/Urinary Tract: Bilateral adrenal glands appear normal. Bilateral renal lobulations. Urinary bladder is unremarkable for degree of distension. Stomach/Bowel: Radiopaque enteric contrast material traverses the sigmoid colon. Stomach is minimally distended limiting evaluation. No pathologic dilation of small or large bowel. Colonic stool burden compatible with constipation. Vascular/Lymphatic: Aortic atherosclerosis. Normal caliber abdominal aorta. Smooth IVC contours. Reproductive: Uterus is surgically absent. No suspicious adnexal mass. Other: Irregular soft tissue stranding extends along the mesenteric root with nodular foci peritoneal soft tissue nodularity anterior to the pancreatic mass for instance measuring 11 mm on image 36/2. No significant abdominopelvic free fluid. Musculoskeletal: Sclerotic focus in the T8 vertebral body on image 60/4 in the left acetabulum on image 65/3 present dating back to April 09, 2018 compatible with a benign finding. No aggressive lytic or blastic lesion of bone. IMPRESSION: 1. CT findings compatible with primary pancreatic neoplasm, likely adenocarcinoma, with obstruction of the pancreatic and common bile ducts. Suggest GI consult for ERCP with therapeutic stent placement and biopsy as well as oncology consultation, with possible presentation at multidisciplinary tumor board. 2. Although difficult to definitively assess given contrast timing on this examination the lesion  appears to encase and occlude the SMV and SMV/portal confluence. 3. Irregular soft tissue stranding extends along the mesenteric root with nodular foci of peritoneal soft tissue nodularity anterior to the pancreatic mass, suspicious for peritoneal carcinomatosis. 4. Sludge in a distended gallbladder without evidence of acute gallbladder inflammation. 5. Colonic stool burden compatible with constipation. 6.  Aortic Atherosclerosis (ICD10-I70.0). These results will be called to the ordering clinician or representative by the Radiologist Assistant, and communication documented in the PACS or [employer]. Electronically Signed   By: Maudry Mayhew M.D.   On: 10/12/2023 10:50    ASSESSMENT & PLAN: 83 yo female   Malignant neoplasm of pancreatic head, adenocarcinoma, T3N0 by EUS, possible peritoneal carcinomatosis She had an episode of abdominal pain, nausea, and dizziness at church and came to ED 08/18/2023, labs were reassuring and she felt better, imaging was not done.   -Due to abdominal pain, PCP ordered CT AP done 10/08/2023 showing a stable solid right lower lobe pulmonary nodule from 2019 and an ill-defined hypodense lesion in the pancreatic head measuring 4.4 x 3.9 x 3.3 cm obstructing the pancreatic and CBD appearing to encase and occlude the SMV and portal confluence with soft tissue stranding extending along the mesenteric root with nodular foci peritoneal soft tissue nodularity anterior to the mass; overall concerning for primary pancreatic malignancy with peritoneal carcinomatosis.    -EUS 10/16/23 by Dr. Dulce Sellar showing a T3 N0 pancreatic head mass invading the SMA and ERCP with stenting by Dr. Ewing Schlein, cytology confirming adenocarcinoma.  -We reviewed the aggressive nature of pancreatic cancer, and the high risk of recurrence even after complete resection -Unfortunately due to her age, the vascular invasion of the SMA, and possible peritoneal carcinomatosis, she is likely not a surgical  candidate.  -We are recommending a PET scan to evaluate for carcinomatosis and complete staging. Will draw baseline CA  19-9 at next lab -We discussed that without surgery, this is likely not curable, but still treatable. Will request MMR on pancreatic biopsy, to see if she is a candidate for immunotherapy.  -She agrees to genetics to r/o BRCA mutation, if positive would be a candidate for PARP inhibitor -We discussed there is no role for radiation at this time  -Janet Williamson appears stable. She is asymptomatic except unintentional weight loss. She is independent and active with controlled co-morbidities and strong family support.  -She is interested in cancer treatment and would likely be a good candidate for chemotherapy -We discussed Single agent Gemcitabine vs Gem/Abraxane, potential risks/benefits, side effects, and symptom management. She sgrees to proceed. Treatment goal is palliative  -Repeat labs at PCP 10/23/23 showed improving LFTs and normal Tbili.  -We discussed a port. Will also refer her for nutrition and chemo ed -F/up with C1 chemo -Pt seen with Dr. Mosetta Putt   Extrahepatic biliary obstruction 2/2 #1 -S/p ERCP with stenting, Tbili improved -GI Dr. Dulce Sellar and Dr. Ewing Schlein  Family history -She is the only person in her family with cancer -Agrees to genetic testing   PLAN: -Imaging, EUS/ERCP, and path reviewed -PET scan, Chemo ed, nutrition, and port placement in the next 1-3 weeks -Genetic testing and baseline CA 19-9 with next lab -Request MMR on pancreas biopsy -F/up with C1 chemo -Pt seen with Dr. Mosetta Putt      Orders Placed This Encounter  Procedures   NM PET Image Initial (PI) Skull Base To Thigh    Standing Status:   Future    Expected Date:   10/31/2023    Expiration Date:   10/23/2024    If indicated for the ordered procedure, I authorize the administration of a radiopharmaceutical per Radiology protocol:   Yes    Preferred imaging location?:   [place]   CBC with  Differential (Cancer Center Only)    Standing Status:   Future    Expected Date:   11/12/2023    Expiration Date:   11/11/2024   CMP (Cancer Center only)    Standing Status:   Future    Expected Date:   11/12/2023    Expiration Date:   11/11/2024   CBC with Differential (Cancer Center Only)    Standing Status:   Future    Expected Date:   11/19/2023    Expiration Date:   11/18/2024   CMP (Cancer Center only)    Standing Status:   Future    Expected Date:   11/19/2023    Expiration Date:   11/18/2024   CBC with Differential (Cancer Center Only)    Standing Status:   Future    Expected Date:   12/10/2023    Expiration Date:   12/09/2024   CMP (Cancer Center only)    Standing Status:   Future    Expected Date:   12/10/2023    Expiration Date:   12/09/2024   CBC with Differential (Cancer Center Only)    Standing Status:   Future    Expected Date:   12/24/2023    Expiration Date:   12/23/2024   CMP (Cancer Center only)    Standing Status:   Future    Expected Date:   12/24/2023    Expiration Date:   12/23/2024   CA 19.9    Standing Status:   Standing    Number of Occurrences:   50    Expiration Date:   10/23/2024   Ambulatory Referral to [facility]  Nutrition    Referral Priority:   Routine    Referral Type:   Consultation    Referral Reason:   Specialty Services Required    Number of Visits Requested:   1    All questions were answered. The patient knows to call the clinic with any problems, questions or concerns.     Pollyann Samples, NP 10/24/2023    Addendum I have seen the patient, examined her. I agree with the assessment and and plan and have edited the notes.   83 yo female with PMH of hypertension, diabetes, arthritis, and history of stroke in 2008 without residual deficits, was referred for newly diagnosed pancreatic cancer.  I have reviewed her images, procedure notes, and labs, and discussed the findings with her.  She presented with obstructive jaundice from pancreatic cancer in the head,  which is staged T3 N0 by EUS, with invasion of SMA.  Her CT scan is concerning for peritoneal metastasis, will obtain PET scan for further evaluation. We will review her case in GI conference next week.  I recommended genetic testing, to see if she is a targeted therapy candidate.  Will also try MMR on her biopsy if there is adequate tissue.  We discussed chemotherapy options, I recommend her to try gemcitabine and Abraxane, with gemcitabine alone for first cycle.  She is interested to, chemo consent obtained.  The goal of therapy is palliative.  If she has no metastatic disease on PET, and next staging scan, we can also consider consolidation radiation after 3 to 6 months of chemotherapy.  All questions were answered.  I spent a total of 60 minutes for her visit today, more than 50% time on face-to-face counseling.  Malachy Mood MD 10/24/2023

## 2023-10-24 NOTE — Progress Notes (Signed)
START ON PATHWAY REGIMEN - Pancreatic Adenocarcinoma     A cycle is every 28 days:     Nab-paclitaxel (protein bound)      Gemcitabine   **Always confirm dose/schedule in your pharmacy ordering system**  Patient Characteristics: Metastatic Disease, First Line, PS = 0,1, BRCA1/2 and PALB2  Mutation Absent/Unknown Therapeutic Status: Metastatic Disease Line of Therapy: First Line ECOG Performance Status: 1 BRCA1/2 Mutation Status: Awaiting Test Results PALB2 Mutation Status: Awaiting Test Results Intent of Therapy: Non-Curative / Palliative Intent, Discussed with Patient 

## 2023-10-25 ENCOUNTER — Other Ambulatory Visit: Payer: Self-pay | Admitting: Genetic Counselor

## 2023-10-25 ENCOUNTER — Telehealth: Payer: Self-pay | Admitting: Genetic Counselor

## 2023-10-25 ENCOUNTER — Other Ambulatory Visit: Payer: Self-pay

## 2023-10-25 ENCOUNTER — Encounter: Payer: Self-pay | Admitting: Hematology

## 2023-10-25 DIAGNOSIS — C250 Malignant neoplasm of head of pancreas: Secondary | ICD-10-CM

## 2023-10-25 NOTE — Telephone Encounter (Signed)
Inbasket note from Santiago Glad asking for POC testing order to be placed for next lab appointment on 11/12/2023.  Order placed and TRF put in for [employer]

## 2023-10-27 ENCOUNTER — Encounter: Payer: Self-pay | Admitting: Hematology

## 2023-10-30 ENCOUNTER — Other Ambulatory Visit: Payer: Self-pay | Admitting: *Deleted

## 2023-10-30 NOTE — Progress Notes (Signed)
The proposed treatment discussed in conference is for discussion purpose only and is not a binding recommendation.  The patients have not been physically examined, or presented with their treatment options.  Therefore, final treatment plans cannot be decided.  

## 2023-10-31 ENCOUNTER — Encounter: Payer: Self-pay | Admitting: Hematology

## 2023-11-01 ENCOUNTER — Encounter (HOSPITAL_COMMUNITY)
Admission: RE | Admit: 2023-11-01 | Discharge: 2023-11-01 | Disposition: A | Discharge status: Home / Self Care | Payer: Medicare HMO | Source: Ambulatory Visit | Attending: Hematology | Admitting: Hematology

## 2023-11-01 ENCOUNTER — Encounter: Payer: Self-pay | Admitting: Family Medicine

## 2023-11-01 DIAGNOSIS — C250 Malignant neoplasm of head of pancreas: Secondary | ICD-10-CM | POA: Insufficient documentation

## 2023-11-01 DIAGNOSIS — R911 Solitary pulmonary nodule: Secondary | ICD-10-CM | POA: Diagnosis not present

## 2023-11-01 LAB — GLUCOSE, CAPILLARY: Glucose-Capillary: 147 mg/dL — ABNORMAL HIGH (ref 70–99)

## 2023-11-01 MED ORDER — FLUDEOXYGLUCOSE F - 18 (FDG) INJECTION
7.8500 | Freq: Once | INTRAVENOUS | Status: AC
  Administered 2023-11-01: 7.85 via INTRAVENOUS

## 2023-11-04 ENCOUNTER — Telehealth: Payer: Self-pay | Admitting: Nurse Practitioner

## 2023-11-04 DIAGNOSIS — C250 Malignant neoplasm of head of pancreas: Secondary | ICD-10-CM

## 2023-11-04 DIAGNOSIS — R9389 Abnormal findings on diagnostic imaging of other specified body structures: Secondary | ICD-10-CM

## 2023-11-04 NOTE — Telephone Encounter (Signed)
I called pt to review her PET scan and discussed the incidental findings. She agrees with thyroid US. I also reviewed the tumor board discussion and she agrees with referral to Dr. Donell Beers. She is aware of upcoming appts as scheduled and had no other questions at this time.   Santiago Glad, NP

## 2023-11-06 ENCOUNTER — Telehealth: Payer: Self-pay

## 2023-11-06 NOTE — Progress Notes (Signed)
 Pharmacist Chemotherapy Monitoring - Initial Assessment    Anticipated start date: 11/13/23   The following has been reviewed per standard work regarding the patient's treatment regimen: The patient's diagnosis, treatment plan and drug doses, and organ/hematologic function Lab orders and baseline tests specific to treatment regimen  The treatment plan start date, drug sequencing, and pre-medications Prior authorization status  Patient's documented medication list, including drug-drug interaction screen and prescriptions for anti-emetics and supportive care specific to the treatment regimen The drug concentrations, fluid compatibility, administration routes, and timing of the medications to be used The patient's access for treatment and lifetime cumulative dose history, if applicable  The patient's medication allergies and previous infusion related reactions, if applicable   Changes made to treatment plan:  N/A  Follow up needed:  N/A   Janet Williamson, RPH, 11/06/2023  10:15 AM

## 2023-11-06 NOTE — Telephone Encounter (Signed)
Left voicemail notifying patient's daughter that the FMLA documents for both sisters had been completed and faxed back to company. Fax confirmation received. Copies placed up front for pick up at next visit.

## 2023-11-11 ENCOUNTER — Ambulatory Visit (HOSPITAL_COMMUNITY)
Admission: RE | Admit: 2023-11-11 | Discharge: 2023-11-11 | Disposition: A | Discharge status: Home / Self Care | Payer: Medicare HMO | Source: Ambulatory Visit | Attending: Nurse Practitioner | Admitting: Nurse Practitioner

## 2023-11-11 ENCOUNTER — Other Ambulatory Visit: Payer: Self-pay | Admitting: Nurse Practitioner

## 2023-11-11 ENCOUNTER — Encounter: Payer: Self-pay | Admitting: Hematology

## 2023-11-11 DIAGNOSIS — R9389 Abnormal findings on diagnostic imaging of other specified body structures: Secondary | ICD-10-CM | POA: Diagnosis not present

## 2023-11-11 DIAGNOSIS — E042 Nontoxic multinodular goiter: Secondary | ICD-10-CM | POA: Diagnosis not present

## 2023-11-11 MED ORDER — PROCHLORPERAZINE MALEATE 10 MG PO TABS: 10.0000 mg | ORAL_TABLET | Freq: Four times a day (QID) | ORAL | 3 refills | Status: DC | PRN

## 2023-11-11 MED ORDER — ONDANSETRON HCL 8 MG PO TABS
8.0000 mg | ORAL_TABLET | Freq: Three times a day (TID) | ORAL | 3 refills | Status: DC | PRN
Start: 2023-11-11 — End: 2023-11-12

## 2023-11-12 ENCOUNTER — Other Ambulatory Visit: Payer: Self-pay

## 2023-11-12 ENCOUNTER — Inpatient Hospital Stay: Payer: Medicare HMO | Admitting: Nutrition

## 2023-11-12 ENCOUNTER — Inpatient Hospital Stay: Payer: Medicare HMO | Attending: Nurse Practitioner

## 2023-11-12 ENCOUNTER — Inpatient Hospital Stay: Payer: Medicare HMO

## 2023-11-12 ENCOUNTER — Inpatient Hospital Stay (HOSPITAL_BASED_OUTPATIENT_CLINIC_OR_DEPARTMENT_OTHER): Payer: Medicare HMO | Admitting: Nurse Practitioner

## 2023-11-12 ENCOUNTER — Encounter: Payer: Self-pay | Admitting: Nurse Practitioner

## 2023-11-12 VITALS — BP 137/59 | HR 68 | Temp 97.6°F | Resp 18 | Ht 63.0 in | Wt 159.2 lb

## 2023-11-12 DIAGNOSIS — C250 Malignant neoplasm of head of pancreas: Secondary | ICD-10-CM | POA: Diagnosis not present

## 2023-11-12 DIAGNOSIS — Z79899 Other long term (current) drug therapy: Secondary | ICD-10-CM | POA: Insufficient documentation

## 2023-11-12 DIAGNOSIS — Z5111 Encounter for antineoplastic chemotherapy: Secondary | ICD-10-CM | POA: Diagnosis not present

## 2023-11-12 DIAGNOSIS — E876 Hypokalemia: Secondary | ICD-10-CM | POA: Insufficient documentation

## 2023-11-12 DIAGNOSIS — E042 Nontoxic multinodular goiter: Secondary | ICD-10-CM | POA: Insufficient documentation

## 2023-11-12 LAB — CBC WITH DIFFERENTIAL (CANCER CENTER ONLY)
Abs Immature Granulocytes: 0.01 10*3/uL (ref 0.00–0.07)
Basophils Absolute: 0.1 10*3/uL (ref 0.0–0.1)
Basophils Relative: 1 %
Eosinophils Absolute: 0.2 10*3/uL (ref 0.0–0.5)
Eosinophils Relative: 2 %
HCT: 34.5 % — ABNORMAL LOW (ref 36.0–46.0)
Hemoglobin: 10.7 g/dL — ABNORMAL LOW (ref 12.0–15.0)
Immature Granulocytes: 0 %
Lymphocytes Relative: 34 %
Lymphs Abs: 2.8 10*3/uL (ref 0.7–4.0)
MCH: 23.2 pg — ABNORMAL LOW (ref 26.0–34.0)
MCHC: 31 g/dL (ref 30.0–36.0)
MCV: 74.8 fL — ABNORMAL LOW (ref 80.0–100.0)
Monocytes Absolute: 0.5 10*3/uL (ref 0.1–1.0)
Monocytes Relative: 6 %
Neutro Abs: 4.7 10*3/uL (ref 1.7–7.7)
Neutrophils Relative %: 57 %
Platelet Count: 304 10*3/uL (ref 150–400)
RBC: 4.61 MIL/uL (ref 3.87–5.11)
RDW: 16.6 % — ABNORMAL HIGH (ref 11.5–15.5)
WBC Count: 8.2 10*3/uL (ref 4.0–10.5)
nRBC: 0 % (ref 0.0–0.2)

## 2023-11-12 LAB — CMP (CANCER CENTER ONLY)
ALT: 7 U/L (ref 0–44)
AST: 13 U/L — ABNORMAL LOW (ref 15–41)
Albumin: 3.6 g/dL (ref 3.5–5.0)
Alkaline Phosphatase: 86 U/L (ref 38–126)
Anion gap: 6 (ref 5–15)
BUN: 10 mg/dL (ref 8–23)
CO2: 34 mmol/L — ABNORMAL HIGH (ref 22–32)
Calcium: 9.6 mg/dL (ref 8.9–10.3)
Chloride: 99 mmol/L (ref 98–111)
Creatinine: 0.56 mg/dL (ref 0.44–1.00)
GFR, Estimated: >60 mL/min (ref >60)
Glucose, Bld: 160 mg/dL — ABNORMAL HIGH (ref 70–99)
Potassium: 2.8 mmol/L — ABNORMAL LOW (ref 3.5–5.1)
Sodium: 139 mmol/L (ref 135–145)
Total Bilirubin: 0.4 mg/dL (ref 0.0–1.2)
Total Protein: 7.3 g/dL (ref 6.5–8.1)

## 2023-11-12 LAB — GENETIC SCREENING ORDER

## 2023-11-12 MED ORDER — POTASSIUM CHLORIDE CRYS ER 20 MEQ PO TBCR: 20.0000 meq | EXTENDED_RELEASE_TABLET | Freq: Two times a day (BID) | ORAL | 0 refills | Status: DC

## 2023-11-12 MED ORDER — PROCHLORPERAZINE MALEATE 10 MG PO TABS: 10.0000 mg | ORAL_TABLET | Freq: Four times a day (QID) | ORAL | 3 refills | Status: DC | PRN

## 2023-11-12 MED ORDER — ONDANSETRON HCL 8 MG PO TABS: 8.0000 mg | ORAL_TABLET | Freq: Three times a day (TID) | ORAL | 3 refills | Status: DC | PRN

## 2023-11-12 MED ORDER — LOPERAMIDE HCL 2 MG PO CAPS: 2.0000 mg | ORAL_CAPSULE | ORAL | 1 refills | Status: DC | PRN

## 2023-11-12 NOTE — Progress Notes (Signed)
 83 year old female diagnosed with pancreas cancer and followed by Dr. Lanny.    CURRENT THERAPY: Pending first-line single agent gemcitabine  with cycle 1, if tolerated will add Abraxane  with cycle 2   Past medical history includes hypertension, diabetes, arthritis, stroke 2008, anemia, and GERD.  Medications include vitamin D3, Glucophage  XR, Imodium , Zofran , potassium, Compazine .  Labs include potassium 2.8 glucose 160.  Height: 5 feet 3 inches. Weight: 159 pounds 4 ounces today. Usual body weight: Patient weighed 187 pounds in April 2024. BMI: 28.21.  Patient reports she was unaware she was losing weight until she went to the doctor.  She endorses an almost 30 pound weight loss.  This is about 15% since April, 2024.  Currently she reports her appetite as being good.  She states she has loose stools 3-4 times daily.  Provider has ordered Imodium .  She denies nausea, vomiting, and constipation.  Dietary recall reveals patient sometimes drinks an original Ensure for breakfast or pancakes with sausage.  At noon she might have leftover chicken from the night before and salad.  For dinner she typically has some type of meat, vegetable, and starch.  She likes beef and broccoli with rice.  She will occasionally have an orange as a snack.  She drinks coffee, tea, and water .  She mentions she likes yogurt but dislikes eggs.  Nutrition diagnosis: Unintended weight loss related to pancreas cancer and associated treatments as evidenced by 15% weight loss in less than 1 year.  Intervention: Educated to increase to small frequent meals and snacks 5-6 times daily. Increase original Ensure to Ensure Plus 1-2 cartons daily.  Provided samples and coupons. Reviewed no concentrated sweets diet.  Reviewed strategies for increasing calories and protein.  Encouraged weight maintenance. Nutrition facts sheets were given.  Contact information provided.  Monitoring, evaluation, goals: Patient will tolerate adequate  calories and protein to minimize further weight loss.  Next visit: Wednesday, March 5, during infusion with Elvie.  **Disclaimer: This note was dictated with voice recognition software. Similar sounding words can inadvertently be transcribed and this note may contain transcription errors which may not have been corrected upon publication of note.**

## 2023-11-12 NOTE — Progress Notes (Signed)
 Patient Care Team: Leonel Cole, MD as PCP - General (Family Medicine) Jeffrie Oneil BROCKS, MD as PCP - Cardiology (Cardiology) Lanny Callander, MD as Consulting Physician (Hematology) Burnette Fallow, MD as Consulting Physician (Gastroenterology) Rosalie Kitchens, MD as Consulting Physician (Gastroenterology)   CHIEF COMPLAINT: Follow-up pancreatic cancer  Oncology History  Malignant neoplasm of head of pancreas (HCC)  10/24/2023 Initial Diagnosis   Malignant neoplasm of head of pancreas (HCC)   11/13/2023 -  Chemotherapy   Patient is on Treatment Plan : PANCREATIC Abraxane  D1,8,15 + Gemcitabine  D1,8,15 q28d        CURRENT THERAPY: Pending first-line single agent gemcitabine  with cycle 1, if tolerated will add Abraxane  with cycle 2  INTERVAL HISTORY Janet Williamson returns for follow-up, last seen by me 10/24/2023 as a new patient.  She subsequently underwent a PET scan and is here to review results.  Attended chemo class this morning. Chemo scheduled to start tomorrow.  She has been doing well at home with no significant changes.  She has occasional abdominal pain managed with Tylenol .  She has diarrhea 3-4 times daily, and occasional heartburn.  Takes Pepto-Bismol.  She had a coughing fit in the visit from dry throat but otherwise denies cough, chest pain, dyspnea, leg edema, nausea/vomiting, or any other new or specific complaints.  ROS  All other systems reviewed and negative  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 Not seen on most recent TEE 2019.   Cardiomyopathy in other disease    Cataracts, bilateral    Diabetes mellitus    Type 2   Generalized osteoarthritis    GERD (gastroesophageal reflux disease)    HLD (hyperlipidemia)    Hypertension    Hypothyroidism    LBBB (left bundle branch block)    PAF (paroxysmal atrial fibrillation) (HCC)    a. s/p DCCV 06/2017, on Xarelto     Phlebitis    S/P TAVR (transcatheter aortic valve replacement) 04/15/2018   Edwards Sapien 3 THV (size 23 mm, model # 9600TFX, serial # [id number]) via the TF approach   Severe aortic stenosis    a. s/p TAVR 04/2018.   Stroke [facility]) 2008     Past Surgical History:  Procedure Laterality Date   ABDOMINAL HYSTERECTOMY     BILIARY BRUSHING  10/16/2023   Procedure: BILIARY BRUSHING;  Surgeon: Burnette Fallow, MD;  Location: [facility];  Service: Gastroenterology;;   BILIARY STENT PLACEMENT N/A 10/16/2023   Procedure: BILIARY STENT PLACEMENT;  Surgeon: Burnette Fallow, MD;  Location: [facility];  Service: Gastroenterology;  Laterality: N/A;   BIOPSY  05/19/2019   Procedure: BIOPSY;  Surgeon: Donnald Charleston, MD;  Location: [facility] ENDOSCOPY;  Service: Endoscopy;;   BREAST BIOPSY Left    years ago at a doctor's office   COLONOSCOPY     COLONOSCOPY WITH PROPOFOL  N/A 10/02/2018   Procedure: COLONOSCOPY WITH PROPOFOL ;  Surgeon: Burnette Fallow, MD;  Location: [facility] ENDOSCOPY;  Service: Endoscopy;  Laterality: N/A;   ENTEROSCOPY N/A 05/19/2019   Procedure: ENTEROSCOPY;  Surgeon: Donnald Charleston, MD;  Location: [facility] ENDOSCOPY;  Service: Endoscopy;  Laterality: N/A;   ERCP N/A 10/16/2023   Procedure: ENDOSCOPIC RETROGRADE CHOLANGIOPANCREATOGRAPHY (ERCP);  Surgeon: Burnette Fallow, MD;  Location: [facility];  Service: Gastroenterology;  Laterality: N/A;   ESOPHAGOGASTRODUODENOSCOPY (EGD) WITH PROPOFOL  N/A 10/02/2018   Procedure: ESOPHAGOGASTRODUODENOSCOPY (EGD) WITH PROPOFOL ;  Surgeon: Burnette Fallow, MD;  Location: [facility]OSCOPY;  Service: Endoscopy;  Laterality: N/A;   ESOPHAGOGASTRODUODENOSCOPY (EGD) WITH PROPOFOL  N/A 04/23/2019   Procedure: ESOPHAGOGASTRODUODENOSCOPY (EGD) WITH PROPOFOL ;  Surgeon: Lennard Lesta FALCON, MD;  Location: [facility] ENDOSCOPY;  Service: Endoscopy;  Laterality: N/A;   ESOPHAGOGASTRODUODENOSCOPY (EGD) WITH PROPOFOL  N/A 10/16/2023   Procedure: ESOPHAGOGASTRODUODENOSCOPY (EGD) WITH PROPOFOL ;  Surgeon:  Burnette Fallow, MD;  Location: [facility];  Service: Gastroenterology;  Laterality: N/A;   EUS N/A 10/16/2023   Procedure: FULL UPPER ENDOSCOPIC ULTRASOUND (EUS) RADIAL;  Surgeon: Burnette Fallow, MD;  Location: [facility];  Service: Gastroenterology;  Laterality: N/A;   EYE SURGERY Bilateral    cataract removal   FINE NEEDLE ASPIRATION N/A 10/16/2023   Procedure: FINE NEEDLE ASPIRATION (FNA) LINEAR;  Surgeon: Burnette Fallow, MD;  Location: [facility];  Service: Gastroenterology;  Laterality: N/A;   GIVENS CAPSULE STUDY N/A 10/02/2018   Procedure: GIVENS CAPSULE STUDY;  Surgeon: Burnette Fallow, MD;  Location: [facility] ENDOSCOPY;  Service: Endoscopy;  Laterality: N/A;   RIGHT/LEFT HEART CATH AND CORONARY ANGIOGRAPHY N/A 03/06/2018   Procedure: RIGHT/LEFT HEART CATH AND CORONARY ANGIOGRAPHY;  Surgeon: Claudene Victory ORN, MD;  Location: [facility] INVASIVE CV LAB;  Service: Cardiovascular;  Laterality: N/A;   SMALL BOWEL ENTEROSCOPY  05/19/2019   SPHINCTEROTOMY  10/16/2023   Procedure: SPHINCTEROTOMY;  Surgeon: Burnette Fallow, MD;  Location: [facility];  Service: Gastroenterology;;   TEE WITHOUT CARDIOVERSION N/A 04/01/2018   Procedure: TRANSESOPHAGEAL ECHOCARDIOGRAM (TEE);  Surgeon: Jeffrie Oneil BROCKS, MD;  Location: [facility] ENDOSCOPY;  Service: Cardiovascular;  Laterality: N/A;   TEE WITHOUT CARDIOVERSION N/A 04/15/2018   Procedure: TRANSESOPHAGEAL ECHOCARDIOGRAM (TEE);  Surgeon: Wonda Sharper, MD;  Location: [facility] OR;  Service: Open Heart Surgery;  Laterality: N/A;   TOTAL KNEE ARTHROPLASTY Right 04/14/2013   Dr Addie   TOTAL KNEE ARTHROPLASTY Right 04/14/2013   Procedure: TOTAL KNEE ARTHROPLASTY;  Surgeon: Cordella Glendia Addie, MD;  Location: [facility] OR;  Service: Orthopedics;  Laterality: Right;   TRANSCATHETER AORTIC VALVE REPLACEMENT, TRANSFEMORAL  04/15/2018   TRANSCATHETER AORTIC VALVE REPLACEMENT, TRANSFEMORAL Bilateral 04/15/2018   Procedure: TRANSCATHETER AORTIC VALVE REPLACEMENT, TRANSFEMORAL;  Surgeon: Wonda Sharper,  MD;  Location: [facility] OR;  Service: Open Heart Surgery;  Laterality: Bilateral;     Outpatient Encounter Medications as of 11/12/2023  Medication Sig   acetaminophen  (TYLENOL ) 325 MG tablet Take 325-650 mg by mouth every 8 (eight) hours as needed (for headaches).   amLODipine  (NORVASC ) 5 MG tablet Take 5 mg by mouth daily.   Blood Glucose Monitoring Suppl (ACCU-CHEK GUIDE ME) w/Device KIT by Does not apply route.   Blood Glucose Monitoring Suppl (ONE TOUCH ULTRA 2) w/Device KIT Use to check blood sugars three times daily DX: E11.9   Cholecalciferol  (VITAMIN D3) 5000 units CAPS Take 5,000 Units by mouth daily.   hydrochlorothiazide  (HYDRODIURIL ) 25 MG tablet Take 25 mg by mouth daily.    hydrOXYzine (ATARAX/VISTARIL) 25 MG tablet Take 25 mg by mouth 3 (three) times daily as needed.   Lancets (ONETOUCH DELICA PLUS LANCET33G) MISC TEST BLOOD SUGAR THREE TIMES DAILY   loperamide  (IMODIUM ) 2 MG capsule Take 1 capsule (2 mg total) by mouth as needed for diarrhea or loose stools.   metFORMIN  (GLUCOPHAGE -XR) 750 MG 24 hr tablet TAKE 2 TABLETS ONE TIME DAILY WITH BREAKFAST   metoprolol  succinate (TOPROL -XL) 50 MG 24 hr tablet Take 50 mg by mouth daily.    ONETOUCH ULTRA TEST test strip TEST BLOOD SUGAR THREE TIMES DAILY   Polyethyl Glycol-Propyl Glycol (SYSTANE OP) Place 1 drop into  both eyes 2 (two) times daily.    potassium chloride  SA (KLOR-CON  M) 20 MEQ tablet Take 1 tablet (20 mEq total) by mouth 2 (two) times daily.   simvastatin  (ZOCOR ) 20 MG tablet Take 20 mg by mouth every evening.   ondansetron  (ZOFRAN ) 8 MG tablet Take 1 tablet (8 mg total) by mouth every 8 (eight) hours as needed for nausea or vomiting.   prochlorperazine  (COMPAZINE ) 10 MG tablet Take 1 tablet (10 mg total) by mouth every 6 (six) hours as needed for nausea or vomiting.   [DISCONTINUED] ondansetron  (ZOFRAN ) 8 MG tablet Take 1 tablet (8 mg total) by mouth every 8 (eight) hours as needed for nausea or vomiting. (Patient not taking:  Reported on 11/12/2023)   [DISCONTINUED] prochlorperazine  (COMPAZINE ) 10 MG tablet Take 1 tablet (10 mg total) by mouth every 6 (six) hours as needed for nausea or vomiting. (Patient not taking: Reported on 11/12/2023)   No facility-administered encounter medications on file as of 11/12/2023.     Today's Vitals   11/12/23 1105 11/12/23 1106  BP: (!) 142/69 (!) 137/59  Pulse: 68   Resp: 18   Temp: 97.6 F (36.4 C)   SpO2: 98%   Weight: 159 lb 4 oz (72.2 kg)   Height: 5' 3 (1.6 m)    Body mass index is 28.21 kg/m.   PHYSICAL EXAM GENERAL:alert, no distress and comfortable SKIN: no rash  EYES: sclera clear NECK: without mass LYMPH:  no palpable cervical or supraclavicular lymphadenopathy  LUNGS: clear with normal breathing effort HEART: regular rate & rhythm, no lower extremity edema ABDOMEN: abdomen soft, non-tender and normal bowel sounds NEURO: alert & oriented x 3 with fluent speech, no focal motor/sensory deficits   CBC    Component Value Date/Time   WBC 8.2 11/12/2023 0855   WBC 6.5 10/16/2023 1008   RBC 4.61 11/12/2023 0855   HGB 10.7 (L) 11/12/2023 0855   HGB 8.0 (L) 03/13/2019 1115   HCT 34.5 (L) 11/12/2023 0855   HCT 29.9 (L) 03/13/2019 1115   PLT 304 11/12/2023 0855   PLT 511 (H) 03/13/2019 1115   MCV 74.8 (L) 11/12/2023 0855   MCV 67 (L) 03/13/2019 1115   MCH 23.2 (L) 11/12/2023 0855   MCHC 31.0 11/12/2023 0855   RDW 16.6 (H) 11/12/2023 0855   RDW 21.8 (H) 03/13/2019 1115   LYMPHSABS 2.8 11/12/2023 0855   LYMPHSABS 2.4 03/04/2018 1215   MONOABS 0.5 11/12/2023 0855   EOSABS 0.2 11/12/2023 0855   EOSABS 0.1 03/04/2018 1215   BASOSABS 0.1 11/12/2023 0855   BASOSABS 0.0 03/04/2018 1215     CMP     Component Value Date/Time   NA 139 11/12/2023 0855   NA 137 03/13/2019 1115   K 2.8 (L) 11/12/2023 0855   CL 99 11/12/2023 0855   CO2 34 (H) 11/12/2023 0855   GLUCOSE 160 (H) 11/12/2023 0855   BUN 10 11/12/2023 0855   BUN 13 03/13/2019 1115   CREATININE  0.56 11/12/2023 0855   CALCIUM  9.6 11/12/2023 0855   PROT 7.3 11/12/2023 0855   PROT 7.7 10/17/2018 1255   ALBUMIN 3.6 11/12/2023 0855   ALBUMIN 4.2 10/17/2018 1255   AST 13 (L) 11/12/2023 0855   ALT 7 11/12/2023 0855   ALKPHOS 86 11/12/2023 0855   BILITOT 0.4 11/12/2023 0855   GFRNONAA >60 11/12/2023 0855   GFRAA >60 05/19/2019 0212     ASSESSMENT & PLAN:82 yo female    Malignant neoplasm of pancreatic head, adenocarcinoma,  T3N0 by EUS, possible peritoneal carcinomatosis -She had an episode of abdominal pain, nausea, and dizziness at church and came to ED 08/18/2023, labs were reassuring and she felt better, imaging was not done.   -Due to abdominal pain, PCP ordered CT AP done 10/08/2023 showing a stable solid right lower lobe pulmonary nodule from 2019 and an ill-defined hypodense lesion in the pancreatic head measuring 4.4 x 3.9 x 3.3 cm obstructing the pancreatic and CBD appearing to encase and occlude the SMV and portal confluence with soft tissue stranding extending along the mesenteric root with nodular foci peritoneal soft tissue nodularity anterior to the mass; overall concerning for primary pancreatic malignancy with peritoneal carcinomatosis.    -EUS 10/16/23 by Dr. Burnette showing a T3 N0 pancreatic head mass invading the SMA and ERCP with stenting by Dr. Rosalie, cytology confirming adenocarcinoma.  -Due to her age, the vascular invasion/abutment of the SMA, and possible peritoneal involvement, she is likely not a surgical candidate but Dr. Aron has reviewed her case and agreed to see the pt for surgical discussion. She has been referred -PET scan showed no definite evidence of metastatic disease.  The questionable peritoneal involvement on CT is felt to be local extension -MMR has been requested. Genetic panel drawn today to r/o BRCA mutation, if positive would be a candidate for PARP inhibitor -Janet Williamson appears stable.  We reviewed symptom management for mild abdominal pain,  reflux, and diarrhea.  She will begin potassium for hypokalemia.  She will see dietitian today and plan to start cycle 1 chemo tomorrow with single agent gemcitabine  for cycle 1 (day 1, day 8) -Consider port placement if she tolerates cycle 1 well, then plan to add Abraxane  with cycle 2 (day 1, 15) -She understands if surgery is offered, the goal of neoadjuvant chemo is curative, but if not offered/not a candidate then the treatment goal would be palliative -Reviewed potential risk and potential benefit and symptom management -Follow-up next week with cycle 1 day 8   Extrahepatic biliary obstruction 2/2 #1 -S/p ERCP with stenting, Tbili improved -GI Dr. Burnette and Dr. Rosalie -LFTs normalized   Family history -She is the only person in her family with cancer -Agrees to genetic testing, panel drawn today    PLAN: -PET scan and today's labs reviewed, genetic testing is pending  -Obtain baseline CA 19-9 tomorrow when she returns for treatment  -Reviewed symptom management: - Rx: potassium BID for hypokalemia, imodium  PRN for diarrhea and sent anti-emetics to local pharmacy -F/up dietitian today  -Return to start C1 D1 chemo 11/13/23 with single agent gemcitabine  for cycle 1 (day 1, day 8) -Consider port placement if she tolerates cycle 1 well, then plan to add Abraxane  with cycle 2 (day 1, 15) -She understands if surgery is offered, the goal of neoadjuvant chemo is curative, but if not offered/not a candidate then the treatment goal would be palliative. Reviewed potential risk/benefit and symptom management and consented. -Follow-up next week with cycle 1 day 8 Gem   All questions were answered. The patient knows to call the clinic with any problems, questions or concerns. No barriers to learning were detected. I spent 30 minutes counseling the patient face to face. The total time spent in the appointment was 40 minutes and more than 50% was on counseling, review of test results, and coordination  of care.   Janet Fleury, NP-C 11/12/2023

## 2023-11-13 ENCOUNTER — Inpatient Hospital Stay: Payer: Medicare HMO

## 2023-11-13 VITALS — BP 153/70 | HR 66 | Temp 97.9°F | Resp 18 | Ht 63.0 in | Wt 158.0 lb

## 2023-11-13 DIAGNOSIS — E042 Nontoxic multinodular goiter: Secondary | ICD-10-CM | POA: Diagnosis not present

## 2023-11-13 DIAGNOSIS — C250 Malignant neoplasm of head of pancreas: Secondary | ICD-10-CM

## 2023-11-13 DIAGNOSIS — E876 Hypokalemia: Secondary | ICD-10-CM | POA: Diagnosis not present

## 2023-11-13 DIAGNOSIS — Z79899 Other long term (current) drug therapy: Secondary | ICD-10-CM | POA: Diagnosis not present

## 2023-11-13 DIAGNOSIS — Z5111 Encounter for antineoplastic chemotherapy: Secondary | ICD-10-CM | POA: Diagnosis not present

## 2023-11-13 MED ORDER — PROCHLORPERAZINE MALEATE 10 MG PO TABS
10.0000 mg | ORAL_TABLET | Freq: Once | ORAL | Status: AC
  Administered 2023-11-13: 10 mg via ORAL
  Filled 2023-11-13: qty 1

## 2023-11-13 MED ORDER — SODIUM CHLORIDE 0.9 % IV SOLN
1000.0000 mg/m2 | Freq: Once | INTRAVENOUS | Status: AC
  Administered 2023-11-13: 1824 mg via INTRAVENOUS
  Filled 2023-11-13: qty 47.97

## 2023-11-13 MED ORDER — SODIUM CHLORIDE 0.9 % IV SOLN: INTRAVENOUS | Status: DC

## 2023-11-13 NOTE — Patient Instructions (Signed)
 [facility]  Discharge Instructions: Thank you for choosing [facility] to provide your oncology and hematology care.   If you have a lab appointment with the Cancer Center, please go directly to the Cancer Center and check in at the registration area.   Wear comfortable clothing and clothing appropriate for easy access to any Portacath or PICC line.   We strive to give you quality time with your provider. You may need to reschedule your appointment if you arrive late (15 or more minutes).  Arriving late affects you and other patients whose appointments are after yours.  Also, if you miss three or more appointments without notifying the office, you may be dismissed from the clinic at the provider's discretion.      For prescription refill requests, have your pharmacy contact our office and allow 72 hours for refills to be completed.    Today you received the following chemotherapy and/or immunotherapy agents Gemcitabine.      To help prevent nausea and vomiting after your treatment, we encourage you to take your nausea medication as directed.  BELOW ARE SYMPTOMS THAT SHOULD BE REPORTED IMMEDIATELY: *FEVER GREATER THAN 100.4 F (38 C) OR HIGHER *CHILLS OR SWEATING *NAUSEA AND VOMITING THAT IS NOT CONTROLLED WITH YOUR NAUSEA MEDICATION *UNUSUAL SHORTNESS OF BREATH *UNUSUAL BRUISING OR BLEEDING *URINARY PROBLEMS (pain or burning when urinating, or frequent urination) *BOWEL PROBLEMS (unusual diarrhea, constipation, pain near the anus) TENDERNESS IN MOUTH AND THROAT WITH OR WITHOUT PRESENCE OF ULCERS (sore throat, sores in mouth, or a toothache) UNUSUAL RASH, SWELLING OR PAIN  UNUSUAL VAGINAL DISCHARGE OR ITCHING   Items with * indicate a potential emergency and should be followed up as soon as possible or go to the Emergency Department if any problems should occur.  Please show the CHEMOTHERAPY ALERT CARD or  IMMUNOTHERAPY ALERT CARD at check-in to the Emergency Department and triage nurse.  Should you have questions after your visit or need to cancel or reschedule your appointment, please contact [facility]  Dept: [phone]  and follow the prompts.  Office hours are 8:00 a.m. to 4:30 p.m. Monday - Friday. Please note that voicemails left after 4:00 p.m. may not be returned until the following business day.  We are closed weekends and major holidays. You have access to a nurse at all times for urgent questions. Please call the main number to the clinic Dept: [phone] and follow the prompts.   For any non-urgent questions, you may also contact your provider using MyChart. We now offer e-Visits for anyone 68 and older to request care online for non-urgent symptoms. For details visit [email or web address].   Also download the MyChart app! Go to the app store, search "MyChart", open the app, select [place], and log in with your MyChart username and password.  Gemcitabine Injection What is this medication? GEMCITABINE (jem SYE ta been) treats some types of cancer. It works by slowing down the growth of cancer cells. This medicine may be used for other purposes; ask your health care provider or pharmacist if you have questions. COMMON BRAND NAME(S): Gemzar, Infugem What should I tell my care team before I take this medication? They need to know if you have any of these conditions: Blood disorders Infection Kidney disease Liver disease Lung or breathing disease, such as asthma or COPD Recent or ongoing  radiation therapy An unusual or allergic reaction to gemcitabine, other medications, foods, dyes, or preservatives If you or your partner are pregnant or trying to get pregnant Breast-feeding How should I use this medication? This medication is injected into a vein. It is given by your care team in a hospital or clinic setting. Talk  to your care team about the use of this medication in children. Special care may be needed. Overdosage: If you think you have taken too much of this medicine contact a poison control center or emergency room at once. NOTE: This medicine is only for you. Do not share this medicine with others. What if I miss a dose? Keep appointments for follow-up doses. It is important not to miss your dose. Call your care team if you are unable to keep an appointment. What may interact with this medication? Interactions have not been studied. This list may not describe all possible interactions. Give your health care provider a list of all the medicines, herbs, non-prescription drugs, or dietary supplements you use. Also tell them if you smoke, drink alcohol, or use illegal drugs. Some items may interact with your medicine. What should I watch for while using this medication? Your condition will be monitored carefully while you are receiving this medication. This medication may make you feel generally unwell. This is not uncommon, as chemotherapy can affect healthy cells as well as cancer cells. Report any side effects. Continue your course of treatment even though you feel ill unless your care team tells you to stop. In some cases, you may be given additional medications to help with side effects. Follow all directions for their use. This medication may increase your risk of getting an infection. Call your care team for advice if you get a fever, chills, sore throat, or other symptoms of a cold or flu. Do not treat yourself. Try to avoid being around people who are sick. This medication may increase your risk to bruise or bleed. Call your care team if you notice any unusual bleeding. Be careful brushing or flossing your teeth or using a toothpick because you may get an infection or bleed more easily. If you have any dental work done, tell your dentist you are receiving this medication. Avoid taking medications that  contain aspirin, acetaminophen, ibuprofen, naproxen, or ketoprofen unless instructed by your care team. These medications may hide a fever. Talk to your care team if you or your partner wish to become pregnant or think you might be pregnant. This medication can cause serious birth defects if taken during pregnancy and for 6 months after the last dose. A negative pregnancy test is required before starting this medication. A reliable form of contraception is recommended while taking this medication and for 6 months after the last dose. Talk to your care team about effective forms of contraception. Do not father a child while taking this medication and for 3 months after the last dose. Use a condom while having sex during this time period. Do not breastfeed while taking this medication and for at least 1 week after the last dose. This medication may cause infertility. Talk to your care team if you are concerned about your fertility. What side effects may I notice from receiving this medication? Side effects that you should report to your care team as soon as possible: Allergic reactions--skin rash, itching, hives, swelling of the face, lips, tongue, or throat Capillary leak syndrome--stomach or muscle pain, unusual weakness or fatigue, feeling faint or lightheaded, decrease in  the amount of urine, swelling of the ankles, hands, or feet, trouble breathing Infection--fever, chills, cough, sore throat, wounds that don't heal, pain or trouble when passing urine, general feeling of discomfort or being unwell Liver injury--right upper belly pain, loss of appetite, nausea, light-colored stool, dark yellow or brown urine, yellowing skin or eyes, unusual weakness or fatigue Low red blood cell level--unusual weakness or fatigue, dizziness, headache, trouble breathing Lung injury--shortness of breath or trouble breathing, cough, spitting up blood, chest pain, fever Stomach pain, bloody diarrhea, pale skin, unusual  weakness or fatigue, decrease in the amount of urine, which may be signs of hemolytic uremic syndrome Sudden and severe headache, confusion, change in vision, seizures, which may be signs of posterior reversible encephalopathy syndrome (PRES) Unusual bruising or bleeding Side effects that usually do not require medical attention (report to your care team if they continue or are bothersome): Diarrhea Drowsiness Hair loss Nausea Pain, redness, or swelling with sores inside the mouth or throat Vomiting This list may not describe all possible side effects. Call your doctor for medical advice about side effects. You may report side effects to FDA at 1-800-FDA-1088. Where should I keep my medication? This medication is given in a hospital or clinic. It will not be stored at home. NOTE: This sheet is a summary. It may not cover all possible information. If you have questions about this medicine, talk to your doctor, pharmacist, or health care provider.  2024 Elsevier/Gold Standard (2022-01-30 00:00:00)

## 2023-11-14 ENCOUNTER — Telehealth: Payer: Self-pay

## 2023-11-14 ENCOUNTER — Other Ambulatory Visit: Payer: Self-pay

## 2023-11-14 DIAGNOSIS — C250 Malignant neoplasm of head of pancreas: Secondary | ICD-10-CM

## 2023-11-14 NOTE — Progress Notes (Signed)
 Faxed surgical referral to Dr. Cherlynn Cornfield at [facility] as per Lacie Burton NP. Faxed last office note, copy of referral, demographics, insurance card and last PET scan. Received fax confirmation of receipt.

## 2023-11-14 NOTE — Telephone Encounter (Signed)
 Sent an email to [employer] at [facility] lab histology to add an MMR to this patients biopsy as requested by Lacie Burton NP. CAse number is [id number].

## 2023-11-15 LAB — CANCER ANTIGEN 19-9: CA 19-9: <2 U/mL (ref 0–35)

## 2023-11-18 LAB — CYTOLOGY - NON PAP

## 2023-11-20 ENCOUNTER — Encounter: Payer: Self-pay | Admitting: Hematology

## 2023-11-20 ENCOUNTER — Inpatient Hospital Stay: Payer: Medicare HMO

## 2023-11-20 ENCOUNTER — Inpatient Hospital Stay (HOSPITAL_BASED_OUTPATIENT_CLINIC_OR_DEPARTMENT_OTHER): Payer: Medicare HMO | Admitting: Hematology

## 2023-11-20 VITALS — BP 137/66 | HR 71 | Temp 97.7°F | Resp 17 | Wt 158.1 lb

## 2023-11-20 DIAGNOSIS — E042 Nontoxic multinodular goiter: Secondary | ICD-10-CM | POA: Diagnosis not present

## 2023-11-20 DIAGNOSIS — C250 Malignant neoplasm of head of pancreas: Secondary | ICD-10-CM

## 2023-11-20 DIAGNOSIS — E876 Hypokalemia: Secondary | ICD-10-CM | POA: Diagnosis not present

## 2023-11-20 DIAGNOSIS — Z79899 Other long term (current) drug therapy: Secondary | ICD-10-CM | POA: Diagnosis not present

## 2023-11-20 DIAGNOSIS — Z5111 Encounter for antineoplastic chemotherapy: Secondary | ICD-10-CM | POA: Diagnosis not present

## 2023-11-20 LAB — CMP (CANCER CENTER ONLY)
ALT: 9 U/L (ref 0–44)
AST: 13 U/L — ABNORMAL LOW (ref 15–41)
Albumin: 3.5 g/dL (ref 3.5–5.0)
Alkaline Phosphatase: 78 U/L (ref 38–126)
Anion gap: 5 (ref 5–15)
BUN: 10 mg/dL (ref 8–23)
CO2: 35 mmol/L — ABNORMAL HIGH (ref 22–32)
Calcium: 10 mg/dL (ref 8.9–10.3)
Chloride: 99 mmol/L (ref 98–111)
Creatinine: 0.58 mg/dL (ref 0.44–1.00)
GFR, Estimated: >60 mL/min (ref >60)
Glucose, Bld: 183 mg/dL — ABNORMAL HIGH (ref 70–99)
Potassium: 3.4 mmol/L — ABNORMAL LOW (ref 3.5–5.1)
Sodium: 139 mmol/L (ref 135–145)
Total Bilirubin: 0.3 mg/dL (ref 0.0–1.2)
Total Protein: 7.5 g/dL (ref 6.5–8.1)

## 2023-11-20 LAB — CBC WITH DIFFERENTIAL (CANCER CENTER ONLY)
Abs Immature Granulocytes: 0.02 10*3/uL (ref 0.00–0.07)
Basophils Absolute: 0 10*3/uL (ref 0.0–0.1)
Basophils Relative: 1 %
Eosinophils Absolute: 0.1 10*3/uL (ref 0.0–0.5)
Eosinophils Relative: 1 %
HCT: 33.1 % — ABNORMAL LOW (ref 36.0–46.0)
Hemoglobin: 10.3 g/dL — ABNORMAL LOW (ref 12.0–15.0)
Immature Granulocytes: 0 %
Lymphocytes Relative: 30 %
Lymphs Abs: 2 10*3/uL (ref 0.7–4.0)
MCH: 23.5 pg — ABNORMAL LOW (ref 26.0–34.0)
MCHC: 31.1 g/dL (ref 30.0–36.0)
MCV: 75.4 fL — ABNORMAL LOW (ref 80.0–100.0)
Monocytes Absolute: 0.3 10*3/uL (ref 0.1–1.0)
Monocytes Relative: 5 %
Neutro Abs: 4.2 10*3/uL (ref 1.7–7.7)
Neutrophils Relative %: 63 %
Platelet Count: 229 10*3/uL (ref 150–400)
RBC: 4.39 MIL/uL (ref 3.87–5.11)
RDW: 16.4 % — ABNORMAL HIGH (ref 11.5–15.5)
WBC Count: 6.6 10*3/uL (ref 4.0–10.5)
nRBC: 0 % (ref 0.0–0.2)

## 2023-11-20 MED ORDER — PROCHLORPERAZINE MALEATE 10 MG PO TABS
10.0000 mg | ORAL_TABLET | Freq: Once | ORAL | Status: AC
Start: 2023-11-20 — End: 2023-11-20
  Administered 2023-11-20: 10 mg via ORAL
  Filled 2023-11-20: qty 1

## 2023-11-20 MED ORDER — SODIUM CHLORIDE 0.9 % IV SOLN
1000.0000 mg/m2 | Freq: Once | INTRAVENOUS | Status: AC
  Administered 2023-11-20: 1824 mg via INTRAVENOUS
  Filled 2023-11-20: qty 47.97

## 2023-11-20 MED ORDER — SODIUM CHLORIDE 0.9 % IV SOLN: INTRAVENOUS | Status: DC

## 2023-11-20 NOTE — Patient Instructions (Signed)
[facility]  Discharge Instructions: Thank you for choosing [facility] to provide your oncology and hematology care.   If you have a lab appointment with the Cancer Center, please go directly to the Cancer Center and check in at the registration area.   Wear comfortable clothing and clothing appropriate for easy access to any Portacath or PICC line.   We strive to give you quality time with your provider. You may need to reschedule your appointment if you arrive late (15 or more minutes).  Arriving late affects you and other patients whose appointments are after yours.  Also, if you miss three or more appointments without notifying the office, you may be dismissed from the clinic at the provider's discretion.      For prescription refill requests, have your pharmacy contact our office and allow 72 hours for refills to be completed.    Today you received the following chemotherapy and/or immunotherapy agents Gemcitabine.      To help prevent nausea and vomiting after your treatment, we encourage you to take your nausea medication as directed.  BELOW ARE SYMPTOMS THAT SHOULD BE REPORTED IMMEDIATELY: *FEVER GREATER THAN 100.4 F (38 C) OR HIGHER *CHILLS OR SWEATING *NAUSEA AND VOMITING THAT IS NOT CONTROLLED WITH YOUR NAUSEA MEDICATION *UNUSUAL SHORTNESS OF BREATH *UNUSUAL BRUISING OR BLEEDING *URINARY PROBLEMS (pain or burning when urinating, or frequent urination) *BOWEL PROBLEMS (unusual diarrhea, constipation, pain near the anus) TENDERNESS IN MOUTH AND THROAT WITH OR WITHOUT PRESENCE OF ULCERS (sore throat, sores in mouth, or a toothache) UNUSUAL RASH, SWELLING OR PAIN  UNUSUAL VAGINAL DISCHARGE OR ITCHING   Items with * indicate a potential emergency and should be followed up as soon as possible or go to the Emergency Department if any problems should occur.  Please show the CHEMOTHERAPY ALERT CARD or  IMMUNOTHERAPY ALERT CARD at check-in to the Emergency Department and triage nurse.  Should you have questions after your visit or need to cancel or reschedule your appointment, please contact [facility]  Dept: [phone]  and follow the prompts.  Office hours are 8:00 a.m. to 4:30 p.m. Monday - Friday. Please note that voicemails left after 4:00 p.m. may not be returned until the following business day.  We are closed weekends and major holidays. You have access to a nurse at all times for urgent questions. Please call the main number to the clinic Dept: [phone] and follow the prompts.   For any non-urgent questions, you may also contact your provider using MyChart. We now offer e-Visits for anyone 68 and older to request care online for non-urgent symptoms. For details visit [email or web address].   Also download the MyChart app! Go to the app store, search "MyChart", open the app, select [place], and log in with your MyChart username and password.  Gemcitabine Injection What is this medication? GEMCITABINE (jem SYE ta been) treats some types of cancer. It works by slowing down the growth of cancer cells. This medicine may be used for other purposes; ask your health care provider or pharmacist if you have questions. COMMON BRAND NAME(S): Gemzar, Infugem What should I tell my care team before I take this medication? They need to know if you have any of these conditions: Blood disorders Infection Kidney disease Liver disease Lung or breathing disease, such as asthma or COPD Recent or ongoing  radiation therapy An unusual or allergic reaction to gemcitabine, other medications, foods, dyes, or preservatives If you or your partner are pregnant or trying to get pregnant Breast-feeding How should I use this medication? This medication is injected into a vein. It is given by your care team in a hospital or clinic setting. Talk  to your care team about the use of this medication in children. Special care may be needed. Overdosage: If you think you have taken too much of this medicine contact a poison control center or emergency room at once. NOTE: This medicine is only for you. Do not share this medicine with others. What if I miss a dose? Keep appointments for follow-up doses. It is important not to miss your dose. Call your care team if you are unable to keep an appointment. What may interact with this medication? Interactions have not been studied. This list may not describe all possible interactions. Give your health care provider a list of all the medicines, herbs, non-prescription drugs, or dietary supplements you use. Also tell them if you smoke, drink alcohol, or use illegal drugs. Some items may interact with your medicine. What should I watch for while using this medication? Your condition will be monitored carefully while you are receiving this medication. This medication may make you feel generally unwell. This is not uncommon, as chemotherapy can affect healthy cells as well as cancer cells. Report any side effects. Continue your course of treatment even though you feel ill unless your care team tells you to stop. In some cases, you may be given additional medications to help with side effects. Follow all directions for their use. This medication may increase your risk of getting an infection. Call your care team for advice if you get a fever, chills, sore throat, or other symptoms of a cold or flu. Do not treat yourself. Try to avoid being around people who are sick. This medication may increase your risk to bruise or bleed. Call your care team if you notice any unusual bleeding. Be careful brushing or flossing your teeth or using a toothpick because you may get an infection or bleed more easily. If you have any dental work done, tell your dentist you are receiving this medication. Avoid taking medications that  contain aspirin, acetaminophen, ibuprofen, naproxen, or ketoprofen unless instructed by your care team. These medications may hide a fever. Talk to your care team if you or your partner wish to become pregnant or think you might be pregnant. This medication can cause serious birth defects if taken during pregnancy and for 6 months after the last dose. A negative pregnancy test is required before starting this medication. A reliable form of contraception is recommended while taking this medication and for 6 months after the last dose. Talk to your care team about effective forms of contraception. Do not father a child while taking this medication and for 3 months after the last dose. Use a condom while having sex during this time period. Do not breastfeed while taking this medication and for at least 1 week after the last dose. This medication may cause infertility. Talk to your care team if you are concerned about your fertility. What side effects may I notice from receiving this medication? Side effects that you should report to your care team as soon as possible: Allergic reactions--skin rash, itching, hives, swelling of the face, lips, tongue, or throat Capillary leak syndrome--stomach or muscle pain, unusual weakness or fatigue, feeling faint or lightheaded, decrease in  the amount of urine, swelling of the ankles, hands, or feet, trouble breathing Infection--fever, chills, cough, sore throat, wounds that don't heal, pain or trouble when passing urine, general feeling of discomfort or being unwell Liver injury--right upper belly pain, loss of appetite, nausea, light-colored stool, dark yellow or brown urine, yellowing skin or eyes, unusual weakness or fatigue Low red blood cell level--unusual weakness or fatigue, dizziness, headache, trouble breathing Lung injury--shortness of breath or trouble breathing, cough, spitting up blood, chest pain, fever Stomach pain, bloody diarrhea, pale skin, unusual  weakness or fatigue, decrease in the amount of urine, which may be signs of hemolytic uremic syndrome Sudden and severe headache, confusion, change in vision, seizures, which may be signs of posterior reversible encephalopathy syndrome (PRES) Unusual bruising or bleeding Side effects that usually do not require medical attention (report to your care team if they continue or are bothersome): Diarrhea Drowsiness Hair loss Nausea Pain, redness, or swelling with sores inside the mouth or throat Vomiting This list may not describe all possible side effects. Call your doctor for medical advice about side effects. You may report side effects to FDA at 1-800-FDA-1088. Where should I keep my medication? This medication is given in a hospital or clinic. It will not be stored at home. NOTE: This sheet is a summary. It may not cover all possible information. If you have questions about this medicine, talk to your doctor, pharmacist, or health care provider.  2024 Elsevier/Gold Standard (2022-01-30 00:00:00)

## 2023-11-20 NOTE — Assessment & Plan Note (Addendum)
T3N0 by EUS, questionable peritoneal carcinomatosis -She had an episode of abdominal pain, nausea, and dizziness at church and came to ED 08/18/2023 -CT AP done 10/08/2023 showing a stable solid right lower lobe pulmonary nodule from 2019 and an ill-defined hypodense lesion in the pancreatic head measuring 4.4 x 3.9 x 3.3 cm obstructing the pancreatic and CBD appearing to encase and occlude the SMV and portal confluence with soft tissue stranding extending along the mesenteric root with nodular foci peritoneal soft tissue nodularity anterior to the mass; overall concerning for primary pancreatic malignancy with peritoneal carcinomatosis.    -EUS 10/16/23 by Dr. Dulce Sellar showing a T3 N0 pancreatic head mass invading the SMA and ERCP with stenting by Dr. Ewing Schlein, cytology confirming adenocarcinoma.  -Due to her age, the vascular invasion/abutment of the SMA, and possible peritoneal involvement, she is likely not a surgical candidate but Dr. Donell Beers has reviewed her case and agreed to see the pt for surgical discussion. She has been referred -PET scan showed no definite evidence of metastatic disease.  The questionable peritoneal involvement on CT is felt to be local extension -plan to start chemo gemcitabine alone, and may add Abraxane from second cycle if tolerates well. She started chemo on 11/13/2023

## 2023-11-20 NOTE — Progress Notes (Signed)
[facility]   Telephone:([phone]) [phone] Fax:(336) [phone]   Clinic Follow up Note   Patient Care Team: Irven Coe, MD as PCP - General (Family Medicine) Jake Bathe, MD as PCP - Cardiology (Cardiology) Malachy Mood, MD as Consulting Physician (Hematology) [name], MD as Consulting Physician (Gastroenterology) Vida Rigger, MD as Consulting Physician (Gastroenterology)  Date of Service:  11/20/2023  CHIEF COMPLAINT: f/u of pancreatic cancer  CURRENT THERAPY:  Neoadjuvant chemotherapy gemcitabine  Oncology History   Malignant neoplasm of head of pancreas (HCC) T3N0 by EUS, questionable peritoneal carcinomatosis -She had an episode of abdominal pain, nausea, and dizziness at church and came to ED 08/18/2023 -CT AP done 10/08/2023 showing a stable solid right lower lobe pulmonary nodule from 2019 and an ill-defined hypodense lesion in the pancreatic head measuring 4.4 x 3.9 x 3.3 cm obstructing the pancreatic and CBD appearing to encase and occlude the SMV and portal confluence with soft tissue stranding extending along the mesenteric root with nodular foci peritoneal soft tissue nodularity anterior to the mass; overall concerning for primary pancreatic malignancy with peritoneal carcinomatosis.    -EUS 10/16/23 by Dr. Dulce Sellar showing a T3 N0 pancreatic head mass invading the SMA and ERCP with stenting by Dr. Ewing Schlein, cytology confirming adenocarcinoma.  -Due to her age, the vascular invasion/abutment of the SMA, and possible peritoneal involvement, she is likely not a surgical candidate but Dr. Donell Beers has reviewed her case and agreed to see the pt for surgical discussion. She has been referred -PET scan showed no definite evidence of metastatic disease.  The questionable peritoneal involvement on CT is felt to be local extension -plan to start chemo gemcitabine alone, and may add Abraxane from second cycle if tolerates well. She started chemo on 11/13/2023    Assessment  and Plan    Pancreatic Cancer stage IIA  83 year old female with pancreatic cancer undergoing chemotherapy. Reports occasional abdominal pain, variable appetite, but stable weight. No significant fatigue. PET scan shows no metastasis; CT scan suggests possible extrapancreatic spread. Blood counts and kidney/liver function are stable. Potassium levels slightly low but improved with supplementation. Discussed chemotherapy risks including fatigue, appetite issues, nausea, and hair loss. Emphasized fall risk during chemotherapy. Explained cumulative side effects of chemotherapy. Plan to add a second drug in two weeks with dose reduction and more breaks between treatments. Informed her about the possibility of undetectable small tumor metastasis and the importance of continued chemotherapy. - Administer gemcitabine chemotherapy today - Schedule next chemotherapy session in two weeks with two drugs and dose reduction - Monitor for side effects including fatigue and hair loss - Encourage activity with adequate rest - Ensure she has a cell phone for emergencies due to fall risk  Thyroid Nodules Incidental thyroid nodules on CT scan, largest 2.6 cm. Some nodules have shown interval growth since 2013, but morphology unchanged. Radiologist recommends follow-up in one year. Biopsy not needed currently due to more serious pancreatic cancer. - Schedule follow-up ultrasound in one year - No biopsy needed at this time  General Health Maintenance Generally in good health aside from cancer diagnosis. Blood pressure and other vital signs are well-managed. - Continue potassium supplementation - Encourage balanced diet and regular meals  Plan -Lab reviewed, adequate treatment, will proceed with cycle 1 day 8 gemcitabine today - Next chemotherapy sessions on March 5th and March 19th with gemcitabine and Abraxane -Follow-up in 2 weeks      SUMMARY OF ONCOLOGIC HISTORY: Oncology History  Malignant neoplasm of  head  of pancreas (HCC)  10/16/2023 Cancer Staging   Staging form: Exocrine Pancreas, AJCC 8th Edition - Clinical stage from 10/16/2023: Stage IIA (cT3, cN0, cM0) - Signed by Malachy Mood, MD on 11/20/2023 Total positive nodes: 0   10/24/2023 Initial Diagnosis   Malignant neoplasm of head of pancreas (HCC)   11/13/2023 -  Chemotherapy   Patient is on Treatment Plan : PANCREATIC Abraxane D1,8,15 + Gemcitabine D1,8,15 q28d        Discussed the use of AI scribe software for clinical note transcription with the patient, who gave verbal consent to proceed.  History of Present Illness   The patient, an 83 year old female with pancreatic cancer, presents for a follow-up visit. She reports occasional abdominal pain, but it is not severe enough to require pain medication. Her appetite is variable, sometimes not wanting to eat, but she makes an effort to maintain her nutrition. She denies fatigue and is able to maintain her household activities. She lives with her daughter and has a supportive family network nearby. She uses a cane for mobility and is aware of the risk of falls, especially post-chemotherapy. She recently traveled out of town, indicating a good level of energy and mobility. She has a history of thyroid nodules, which have been stable since 2013 and are not currently recommended for biopsy.         All other systems were reviewed with the patient and are negative.  MEDICAL HISTORY:  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 Not seen on most recent TEE 2019.   Cardiomyopathy in other disease    Cataracts, bilateral    Diabetes mellitus    Type 2   Generalized osteoarthritis    GERD (gastroesophageal reflux disease)    HLD (hyperlipidemia)    Hypertension    Hypothyroidism    LBBB (left bundle branch block)    PAF (paroxysmal atrial fibrillation) (HCC)    a. s/p DCCV 06/2017, on  Xarelto   Phlebitis    S/P TAVR (transcatheter aortic valve replacement) 04/15/2018   Edwards Sapien 3 THV (size 23 mm, model # 9600TFX, serial # [id number]) via the TF approach   Severe aortic stenosis    a. s/p TAVR 04/2018.   Stroke [facility]) 2008    SURGICAL HISTORY: Past Surgical History:  Procedure Laterality Date   ABDOMINAL HYSTERECTOMY     BILIARY BRUSHING  10/16/2023   Procedure: BILIARY BRUSHING;  Surgeon: [name], MD;  Location: [facility] ENDOSCOPY;  Service: Gastroenterology;;   BILIARY STENT PLACEMENT N/A 10/16/2023   Procedure: BILIARY STENT PLACEMENT;  Surgeon: [name], MD;  Location: [facility] ENDOSCOPY;  Service: Gastroenterology;  Laterality: N/A;   BIOPSY  05/19/2019   Procedure: BIOPSY;  Surgeon: Bernette Redbird, MD;  Location: [facility] ENDOSCOPY;  Service: Endoscopy;;   BREAST BIOPSY Left    years ago at "a doctor's office"   COLONOSCOPY     COLONOSCOPY WITH PROPOFOL N/A 10/02/2018   Procedure: COLONOSCOPY WITH PROPOFOL;  Surgeon: [name], MD;  Location: [facility] ENDOSCOPY;  Service: Endoscopy;  Laterality: N/A;   ENTEROSCOPY N/A 05/19/2019   Procedure: ENTEROSCOPY;  Surgeon: Bernette Redbird, MD;  Location: [facility] ENDOSCOPY;  Service: Endoscopy;  Laterality: N/A;   ERCP N/A 10/16/2023   Procedure: ENDOSCOPIC RETROGRADE CHOLANGIOPANCREATOGRAPHY (ERCP);  Surgeon: [name], MD;  Location: [facility] ENDOSCOPY;  Service: Gastroenterology;  Laterality: N/A;   ESOPHAGOGASTRODUODENOSCOPY (EGD) WITH PROPOFOL  N/A 10/02/2018   Procedure: ESOPHAGOGASTRODUODENOSCOPY (EGD) WITH PROPOFOL;  Surgeon: [name], MD;  Location: [facility] ENDOSCOPY;  Service: Endoscopy;  Laterality: N/A;   ESOPHAGOGASTRODUODENOSCOPY (EGD) WITH PROPOFOL N/A 04/23/2019   Procedure: ESOPHAGOGASTRODUODENOSCOPY (EGD) WITH PROPOFOL;  Surgeon: Graylin Shiver, MD;  Location: [facility] ENDOSCOPY;  Service: Endoscopy;  Laterality: N/A;   ESOPHAGOGASTRODUODENOSCOPY (EGD) WITH PROPOFOL N/A 10/16/2023   Procedure: ESOPHAGOGASTRODUODENOSCOPY  (EGD) WITH PROPOFOL;  Surgeon: [name], MD;  Location: [facility] ENDOSCOPY;  Service: Gastroenterology;  Laterality: N/A;   EUS N/A 10/16/2023   Procedure: FULL UPPER ENDOSCOPIC ULTRASOUND (EUS) RADIAL;  Surgeon: [name], MD;  Location: [facility] ENDOSCOPY;  Service: Gastroenterology;  Laterality: N/A;   EYE SURGERY Bilateral    cataract removal   FINE NEEDLE ASPIRATION N/A 10/16/2023   Procedure: FINE NEEDLE ASPIRATION (FNA) LINEAR;  Surgeon: [name], MD;  Location: [facility] ENDOSCOPY;  Service: Gastroenterology;  Laterality: N/A;   GIVENS CAPSULE STUDY N/A 10/02/2018   Procedure: GIVENS CAPSULE STUDY;  Surgeon: [name], MD;  Location: [facility] ENDOSCOPY;  Service: Endoscopy;  Laterality: N/A;   RIGHT/LEFT HEART CATH AND CORONARY ANGIOGRAPHY N/A 03/06/2018   Procedure: RIGHT/LEFT HEART CATH AND CORONARY ANGIOGRAPHY;  Surgeon: Lyn Records, MD;  Location: [facility] INVASIVE CV LAB;  Service: Cardiovascular;  Laterality: N/A;   SMALL BOWEL ENTEROSCOPY  05/19/2019   SPHINCTEROTOMY  10/16/2023   Procedure: SPHINCTEROTOMY;  Surgeon: [name], MD;  Location: [facility] ENDOSCOPY;  Service: Gastroenterology;;   TEE WITHOUT CARDIOVERSION N/A 04/01/2018   Procedure: TRANSESOPHAGEAL ECHOCARDIOGRAM (TEE);  Surgeon: Jake Bathe, MD;  Location: [facility] ENDOSCOPY;  Service: Cardiovascular;  Laterality: N/A;   TEE WITHOUT CARDIOVERSION N/A 04/15/2018   Procedure: TRANSESOPHAGEAL ECHOCARDIOGRAM (TEE);  Surgeon: Tonny Bollman, MD;  Location: [facility] OR;  Service: Open Heart Surgery;  Laterality: N/A;   TOTAL KNEE ARTHROPLASTY Right 04/14/2013   Dr August Saucer   TOTAL KNEE ARTHROPLASTY Right 04/14/2013   Procedure: TOTAL KNEE ARTHROPLASTY;  Surgeon: Cammy Copa, MD;  Location: [facility] OR;  Service: Orthopedics;  Laterality: Right;   TRANSCATHETER AORTIC VALVE REPLACEMENT, TRANSFEMORAL  04/15/2018   TRANSCATHETER AORTIC VALVE REPLACEMENT, TRANSFEMORAL Bilateral 04/15/2018   Procedure: TRANSCATHETER AORTIC VALVE REPLACEMENT,  TRANSFEMORAL;  Surgeon: Tonny Bollman, MD;  Location: [facility] OR;  Service: Open Heart Surgery;  Laterality: Bilateral;    I have reviewed the social history and family history with the patient and they are unchanged from previous note.  ALLERGIES:  is allergic to ace inhibitors, keflex [cephalexin], and rifadin [rifampin].  MEDICATIONS:  Current Outpatient Medications  Medication Sig Dispense Refill   acetaminophen (TYLENOL) 325 MG tablet Take 325-650 mg by mouth every 8 (eight) hours as needed (for headaches).     amLODipine (NORVASC) 5 MG tablet Take 5 mg by mouth daily.     Blood Glucose Monitoring Suppl (ACCU-CHEK GUIDE ME) w/Device KIT by Does not apply route.     Blood Glucose Monitoring Suppl (ONE TOUCH ULTRA 2) w/Device KIT Use to check blood sugars three times daily DX: E11.9 1 kit 0   Cholecalciferol (VITAMIN D3) 5000 units CAPS Take 5,000 Units by mouth daily.     hydrochlorothiazide (HYDRODIURIL) 25 MG tablet Take 25 mg by mouth daily.      hydrOXYzine (ATARAX/VISTARIL) 25 MG tablet Take 25 mg by mouth 3 (three) times daily as needed.     Lancets (ONETOUCH DELICA PLUS LANCET33G) MISC TEST BLOOD SUGAR THREE TIMES DAILY 300 each 10   loperamide (IMODIUM) 2 MG capsule Take 1 capsule (2 mg total) by  mouth as needed for diarrhea or loose stools. 30 capsule 1   metFORMIN (GLUCOPHAGE-XR) 750 MG 24 hr tablet TAKE 2 TABLETS ONE TIME DAILY WITH BREAKFAST 180 tablet 2   metoprolol succinate (TOPROL-XL) 50 MG 24 hr tablet Take 50 mg by mouth daily.      ondansetron (ZOFRAN) 8 MG tablet Take 1 tablet (8 mg total) by mouth every 8 (eight) hours as needed for nausea or vomiting. 20 tablet 3   ONETOUCH ULTRA TEST test strip TEST BLOOD SUGAR THREE TIMES DAILY 300 strip 3   Polyethyl Glycol-Propyl Glycol (SYSTANE OP) Place 1 drop into both eyes 2 (two) times daily.      potassium chloride SA (KLOR-CON M) 20 MEQ tablet Take 1 tablet (20 mEq total) by mouth 2 (two) times daily. 60 tablet 0    prochlorperazine (COMPAZINE) 10 MG tablet Take 1 tablet (10 mg total) by mouth every 6 (six) hours as needed for nausea or vomiting. 20 tablet 3   simvastatin (ZOCOR) 20 MG tablet Take 20 mg by mouth every evening.     No current facility-administered medications for this visit.   Facility-Administered Medications Ordered in Other Visits  Medication Dose Route Frequency Provider Last Rate Last Admin   0.9 %  sodium chloride infusion   Intravenous Continuous Malachy Mood, MD   Stopped at 11/20/23 1509    PHYSICAL EXAMINATION: ECOG PERFORMANCE STATUS: 1 - Symptomatic but completely ambulatory  Vitals:   11/20/23 1259  BP: 137/66  Pulse: 71  Resp: 17  Temp: 97.7 F (36.5 C)  SpO2: 100%   Wt Readings from Last 3 Encounters:  11/20/23 158 lb 1.6 oz (71.7 kg)  11/13/23 158 lb 0.6 oz (71.7 kg)  11/12/23 159 lb 4 oz (72.2 kg)     GENERAL:alert, no distress and comfortable SKIN: skin color, texture, turgor are normal, no rashes or significant lesions EYES: normal, Conjunctiva are pink and non-injected, sclera clear NECK: supple, thyroid normal size, non-tender, without nodularity LYMPH:  no palpable lymphadenopathy in the cervical, axillary  LUNGS: clear to auscultation and percussion with normal breathing effort HEART: regular rate & rhythm and no murmurs and no lower extremity edema ABDOMEN:abdomen soft, non-tender and normal bowel sounds Musculoskeletal:no cyanosis of digits and no clubbing  NEURO: alert & oriented x 3 with fluent speech, no focal motor/sensory deficits   LABORATORY DATA:  I have reviewed the data as listed    Latest Ref Rng & Units 11/20/2023   12:40 PM 11/12/2023    8:55 AM 10/16/2023   10:08 AM  CBC  WBC 4.0 - 10.5 K/uL 6.6  8.2  6.5   Hemoglobin 12.0 - 15.0 g/dL 96.0  45.4  09.8   Hematocrit 36.0 - 46.0 % 33.1  34.5  33.4   Platelets 150 - 400 K/uL 229  304  278         Latest Ref Rng & Units 11/20/2023   12:40 PM 11/12/2023    8:55 AM 10/16/2023   10:08  AM  CMP  Glucose 70 - 99 mg/dL 119  147  829   BUN 8 - 23 mg/dL 10  10  9    Creatinine 0.44 - 1.00 mg/dL 5.62  1.30  8.65   Sodium 135 - 145 mmol/L 139  139  138   Potassium 3.5 - 5.1 mmol/L 3.4  2.8  3.4   Chloride 98 - 111 mmol/L 99  99  101   CO2 22 - 32 mmol/L 35  34  28   Calcium 8.9 - 10.3 mg/dL 16.1  9.6  9.7   Total Protein 6.5 - 8.1 g/dL 7.5  7.3  7.3   Total Bilirubin 0.0 - 1.2 mg/dL 0.3  0.4  2.8   Alkaline Phos 38 - 126 U/L 78  86  513   AST 15 - 41 U/L 13  13  236   ALT 0 - 44 U/L 9  7  233       RADIOGRAPHIC STUDIES: I have personally reviewed the radiological images as listed and agreed with the findings in the report. No results found.    No orders of the defined types were placed in this encounter.  All questions were answered. The patient knows to call the clinic with any problems, questions or concerns. No barriers to learning was detected. The total time spent in the appointment was 25 minutes.     Malachy Mood, MD 11/20/2023

## 2023-11-21 LAB — CANCER ANTIGEN 19-9: CA 19-9: <2 U/mL (ref 0–35)

## 2023-11-26 DIAGNOSIS — C250 Malignant neoplasm of head of pancreas: Secondary | ICD-10-CM | POA: Diagnosis not present

## 2023-11-26 DIAGNOSIS — K8681 Exocrine pancreatic insufficiency: Secondary | ICD-10-CM | POA: Diagnosis not present

## 2023-11-26 DIAGNOSIS — K529 Noninfective gastroenteritis and colitis, unspecified: Secondary | ICD-10-CM | POA: Diagnosis not present

## 2023-11-27 ENCOUNTER — Other Ambulatory Visit: Payer: Self-pay

## 2023-11-27 DIAGNOSIS — E1165 Type 2 diabetes mellitus with hyperglycemia: Secondary | ICD-10-CM

## 2023-11-27 MED ORDER — METFORMIN HCL ER 750 MG PO TB24: ORAL_TABLET | ORAL | 2 refills | Status: AC

## 2023-12-04 ENCOUNTER — Encounter: Payer: Self-pay | Admitting: Genetic Counselor

## 2023-12-04 DIAGNOSIS — Z1379 Encounter for other screening for genetic and chromosomal anomalies: Secondary | ICD-10-CM | POA: Insufficient documentation

## 2023-12-09 NOTE — Assessment & Plan Note (Signed)
 T3N0 by EUS, questionable peritoneal carcinomatosis -She had an episode of abdominal pain, nausea, and dizziness at church and came to ED 08/18/2023 -CT AP done 10/08/2023 showing a stable solid right lower lobe pulmonary nodule from 2019 and an ill-defined hypodense lesion in the pancreatic head measuring 4.4 x 3.9 x 3.3 cm obstructing the pancreatic and CBD appearing to encase and occlude the SMV and portal confluence with soft tissue stranding extending along the mesenteric root with nodular foci peritoneal soft tissue nodularity anterior to the mass; overall concerning for primary pancreatic malignancy with peritoneal carcinomatosis.    -EUS 10/16/23 by Dr. Dulce Sellar showing a T3 N0 pancreatic head mass invading the SMA and ERCP with stenting by Dr. Ewing Schlein, cytology confirming adenocarcinoma.  -Due to her age, the vascular invasion/abutment of the SMA, and possible peritoneal involvement, she is likely not a surgical candidate but Dr. Donell Beers has reviewed her case and agreed to see the pt for surgical discussion. She was seen on 11/26/2023 and felt to be a poor candidate for surgery  -PET scan showed no definite evidence of metastatic disease.  The questionable peritoneal involvement on CT is felt to be local extension -plan to start chemo gemcitabine alone, and may add Abraxane from second cycle if tolerates well. She started chemo on 11/13/2023

## 2023-12-11 ENCOUNTER — Inpatient Hospital Stay: Payer: Medicare HMO | Attending: Nurse Practitioner

## 2023-12-11 ENCOUNTER — Encounter: Payer: Self-pay | Admitting: Hematology

## 2023-12-11 ENCOUNTER — Inpatient Hospital Stay (HOSPITAL_BASED_OUTPATIENT_CLINIC_OR_DEPARTMENT_OTHER): Payer: Medicare HMO | Admitting: Hematology

## 2023-12-11 ENCOUNTER — Inpatient Hospital Stay: Payer: Medicare HMO | Admitting: Dietician

## 2023-12-11 ENCOUNTER — Inpatient Hospital Stay: Payer: Medicare HMO

## 2023-12-11 VITALS — BP 160/76 | HR 72 | Temp 98.3°F | Resp 17 | Wt 158.6 lb

## 2023-12-11 DIAGNOSIS — Z7984 Long term (current) use of oral hypoglycemic drugs: Secondary | ICD-10-CM | POA: Insufficient documentation

## 2023-12-11 DIAGNOSIS — D649 Anemia, unspecified: Secondary | ICD-10-CM | POA: Diagnosis not present

## 2023-12-11 DIAGNOSIS — Z5111 Encounter for antineoplastic chemotherapy: Secondary | ICD-10-CM | POA: Diagnosis not present

## 2023-12-11 DIAGNOSIS — E1165 Type 2 diabetes mellitus with hyperglycemia: Secondary | ICD-10-CM | POA: Diagnosis not present

## 2023-12-11 DIAGNOSIS — C250 Malignant neoplasm of head of pancreas: Secondary | ICD-10-CM

## 2023-12-11 DIAGNOSIS — Z79899 Other long term (current) drug therapy: Secondary | ICD-10-CM | POA: Insufficient documentation

## 2023-12-11 LAB — CBC WITH DIFFERENTIAL (CANCER CENTER ONLY)
Abs Immature Granulocytes: 0.03 10*3/uL (ref 0.00–0.07)
Basophils Absolute: 0 10*3/uL (ref 0.0–0.1)
Basophils Relative: 1 %
Eosinophils Absolute: 0.1 10*3/uL (ref 0.0–0.5)
Eosinophils Relative: 1 %
HCT: 33.4 % — ABNORMAL LOW (ref 36.0–46.0)
Hemoglobin: 10.4 g/dL — ABNORMAL LOW (ref 12.0–15.0)
Immature Granulocytes: 0 %
Lymphocytes Relative: 27 %
Lymphs Abs: 2.3 10*3/uL (ref 0.7–4.0)
MCH: 23.6 pg — ABNORMAL LOW (ref 26.0–34.0)
MCHC: 31.1 g/dL (ref 30.0–36.0)
MCV: 75.7 fL — ABNORMAL LOW (ref 80.0–100.0)
Monocytes Absolute: 0.6 10*3/uL (ref 0.1–1.0)
Monocytes Relative: 7 %
Neutro Abs: 5.6 10*3/uL (ref 1.7–7.7)
Neutrophils Relative %: 64 %
Platelet Count: 398 10*3/uL (ref 150–400)
RBC: 4.41 MIL/uL (ref 3.87–5.11)
RDW: 17.6 % — ABNORMAL HIGH (ref 11.5–15.5)
WBC Count: 8.6 10*3/uL (ref 4.0–10.5)
nRBC: 0 % (ref 0.0–0.2)

## 2023-12-11 LAB — CMP (CANCER CENTER ONLY)
ALT: 6 U/L (ref 0–44)
AST: 11 U/L — ABNORMAL LOW (ref 15–41)
Albumin: 3.6 g/dL (ref 3.5–5.0)
Alkaline Phosphatase: 66 U/L (ref 38–126)
Anion gap: 6 (ref 5–15)
BUN: 9 mg/dL (ref 8–23)
CO2: 29 mmol/L (ref 22–32)
Calcium: 9.4 mg/dL (ref 8.9–10.3)
Chloride: 101 mmol/L (ref 98–111)
Creatinine: 0.52 mg/dL (ref 0.44–1.00)
GFR, Estimated: >60 mL/min (ref >60)
Glucose, Bld: 210 mg/dL — ABNORMAL HIGH (ref 70–99)
Potassium: 3.5 mmol/L (ref 3.5–5.1)
Sodium: 136 mmol/L (ref 135–145)
Total Bilirubin: 0.3 mg/dL (ref 0.0–1.2)
Total Protein: 7.4 g/dL (ref 6.5–8.1)

## 2023-12-11 MED ORDER — GEMCITABINE HCL CHEMO INJECTION 1 GM/26.3ML
1000.0000 mg/m2 | Freq: Once | INTRAVENOUS | Status: AC
  Administered 2023-12-11: 1824 mg via INTRAVENOUS
  Filled 2023-12-11: qty 47.97

## 2023-12-11 MED ORDER — SODIUM CHLORIDE 0.9 % IV SOLN: INTRAVENOUS | Status: DC

## 2023-12-11 MED ORDER — PACLITAXEL PROTEIN-BOUND CHEMO INJECTION 100 MG
100.0000 mg/m2 | Freq: Once | INTRAVENOUS | Status: AC
  Administered 2023-12-11: 200 mg via INTRAVENOUS
  Filled 2023-12-11: qty 40

## 2023-12-11 MED ORDER — PROCHLORPERAZINE MALEATE 10 MG PO TABS
10.0000 mg | ORAL_TABLET | Freq: Once | ORAL | Status: AC
  Administered 2023-12-11: 10 mg via ORAL
  Filled 2023-12-11: qty 1

## 2023-12-11 NOTE — Progress Notes (Signed)
 Nutrition Follow-up:  Patient with pancreatic cancer. She is currently receiving gemcitabine + abraxane (start 2/5; abraxane added 3/5). Patient is under the care of Dr. Mosetta Putt.   Noted not felt to be a good candidate for surgery s/p 2/18 office visit with Dr. Donell Beers  Met with patient in infusion. She is accompanied by friend from church today. Patient endorses good appetite. Eating 3-4 times/day. Patient had yogurt, sausage patty, banana, coffee for breakfast. She knows she had a salad at dinner, but unable to recall what else she ate yesterday. Patient reports loose foul smelling stools ~5 minutes after eating. She has not started taking Creon as recommended per Dr. Donell Beers on 2/18. Patient denies nausea, vomiting.    Medications: Creon, metformin  Labs: glucose 210  Anthropometrics: Wr 158 lb 9.6 oz today - stable  2/12 - 158 lb 1.6 oz 2/4 - 159 lb 4 oz   NUTRITION DIAGNOSIS: Unintended wt loss - stable   INTERVENTION:  Educated on s/s of EPI and rationale for PERT. Reviewed recommended dose with meals/snacks - handout provided Continue strategies for increasing calories and protein with small frequent meals     MONITORING, EVALUATION, GOAL: wt trends, intake, BM   NEXT VISIT: Wednesday March 19 during infusion

## 2023-12-11 NOTE — Progress Notes (Signed)
 [facility]   [phone] Fax:([phone]) [phone]   Clinic Follow up Note   Patient Care Team: Irven Coe, MD as PCP - General (Family Medicine) Jake Bathe, MD as PCP - Cardiology (Cardiology) Malachy Mood, MD as Consulting Physician (Hematology) [name], MD as Consulting Physician (Gastroenterology) Vida Rigger, MD as Consulting Physician (Gastroenterology)  Date of Service:  12/11/2023  CHIEF COMPLAINT: f/u of pancreatic cancer  CURRENT THERAPY:  Gemcitabine and Abraxane every 2 weeks  Oncology History   Malignant neoplasm of head of pancreas (HCC) T3N0 by EUS, questionable peritoneal carcinomatosis -She had an episode of abdominal pain, nausea, and dizziness at church and came to ED 08/18/2023 -CT AP done 10/08/2023 showing a stable solid right lower lobe pulmonary nodule from 2019 and an ill-defined hypodense lesion in the pancreatic head measuring 4.4 x 3.9 x 3.3 cm obstructing the pancreatic and CBD appearing to encase and occlude the SMV and portal confluence with soft tissue stranding extending along the mesenteric root with nodular foci peritoneal soft tissue nodularity anterior to the mass; overall concerning for primary pancreatic malignancy with peritoneal carcinomatosis.    -EUS 10/16/23 by Dr. Dulce Sellar showing a T3 N0 pancreatic head mass invading the SMA and ERCP with stenting by Dr. Ewing Schlein, cytology confirming adenocarcinoma.  -Due to her age, the vascular invasion/abutment of the SMA, and possible peritoneal involvement, she is likely not a surgical candidate but Dr. Donell Beers has reviewed her case and agreed to see the pt for surgical discussion. She was seen on 11/26/2023 and felt to be a poor candidate for surgery  -PET scan showed no definite evidence of metastatic disease.  The questionable peritoneal involvement on CT is felt to be local extension -plan to start chemo gemcitabine alone, and may add Abraxane from second cycle if tolerates well.  She started chemo on 11/13/2023   Assessment and Plan    Pancreatic cancer She is undergoing chemotherapy with gemcitabine for pancreatic cancer, tolerating it well without significant side effects. Blood counts show mild anemia, and kidney and liver functions are good. Blood sugar is slightly elevated at 210 mg/dL. She is willing to try a stronger two-drug regimen despite increased risks of fatigue, low blood counts, infection, and hair loss. The regimen will be administered biweekly with a slightly reduced dose due to its moderate intensity. If significant side effects occur, the plan is to revert to the single-agent regimen. Hair loss is expected after two to three treatments, and neuropathy risk will be managed with ice packs during infusions. - Administer two-drug chemotherapy regimen with a slightly reduced dose every two weeks. - Monitor for side effects such as fatigue, low blood counts, infection, and hair loss. - Schedule next chemotherapy session for March 19. - Perform a scan every three months to assess treatment efficacy. - Provide a wig if desired due to anticipated hair loss. - Advise her to use ice packs on hands and feet during chemotherapy to reduce neuropathy risk. - Ensure she has support at home, especially on day three post-chemotherapy when fatigue is expected. - Encourage adequate nutrition and hydration to prevent weight loss and dizziness.  Mild anemia She has mild anemia, which is not significantly impacting her health.  Elevated blood sugar Her blood sugar is slightly elevated at 210 mg/dL, noted but not currently addressed with specific interventions.  Genetic testing results Genetic testing returned negative for significant mutations. A variant of unknown significance was found in the BRCA1 gene, but it is not currently  believed to be related to the cancer. Her daughters do not need to be screened based on these results. - Provide a copy of the genetic testing  report to her.      Plan -Lab reviewed, adequate for treatment, will proceed cycle 2 chemo with gemcitabine and Abraxane (dose reduced to 100 mg/m), today -Follow-up in 2 weeks before next cycle chemo.   SUMMARY OF ONCOLOGIC HISTORY: Oncology History  Malignant neoplasm of head of pancreas (HCC)  10/16/2023 Cancer Staging   Staging form: Exocrine Pancreas, AJCC 8th Edition - Clinical stage from 10/16/2023: Stage IIA (cT3, cN0, cM0) - Signed by Malachy Mood, MD on 11/20/2023 Total positive nodes: 0   10/24/2023 Initial Diagnosis   Malignant neoplasm of head of pancreas (HCC)   11/13/2023 -  Chemotherapy   Patient is on Treatment Plan : PANCREATIC Abraxane D1,8,15 + Gemcitabine D1,8,15 q28d     12/04/2023 Genetic Testing   Negative genetic testing on the CancerNext+RNA panel.  BRCA1  p.D695N (c.2083G>A)  VUS identified.  The report date is 12/03/2023.  The [facility] CancerNext+RNAinsight Panel includes sequencing, rearrangement analysis, and RNA analysis for the following 39 genes: APC, ATM, BAP1, BARD1, BMPR1A, BRCA1, BRCA2, BRIP1, CDH1, CDKN2A, CHEK2, FH, FLCN, MET, MLH1, MSH2, MSH6, MUTYH, NF1, NTHL1, PALB2, PMS2, PTEN, RAD51C, RAD51D, SMAD4, STK11, TP53, TSC1, TSC2, and VHL (sequencing and deletion/duplication); AXIN2, HOXB13, MBD4, MSH3, POLD1 and POLE (sequencing only); EPCAM and GREM1 (deletion/duplication only).       Discussed the use of AI scribe software for clinical note transcription with the patient, who gave verbal consent to proceed.  History of Present Illness   Janet Williamson, an 83 year old female with a known diagnosis of pancreatic cancer, presents for a follow-up visit after receiving chemotherapy. She reports no significant side effects from the treatment, denying any fatigue or discomfort. She does mention mild itching but denies any associated rash. She lives alone but has daily support from her six daughters. She is independent and mobile, using a cane for support. She reports  no limitations in her daily activities, including shopping and walking around her house. She denies any abdominal pain. Her blood work shows mild anemia and slightly elevated blood sugar levels. She has received two doses of gemcitabine as part of her chemotherapy regimen.         All other systems were reviewed with the patient and are negative.  MEDICAL HISTORY:  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 Not seen on most recent TEE 2019.   Cardiomyopathy in other disease    Cataracts, bilateral    Diabetes mellitus    Type 2   Generalized osteoarthritis    GERD (gastroesophageal reflux disease)    HLD (hyperlipidemia)    Hypertension    Hypothyroidism    LBBB (left bundle branch block)    PAF (paroxysmal atrial fibrillation) (HCC)    a. s/p DCCV 06/2017, on Xarelto   Phlebitis    S/P TAVR (transcatheter aortic valve replacement) 04/15/2018   Edwards Sapien 3 THV (size 23 mm, model # 9600TFX, serial # [id number]) via the TF approach   Severe aortic stenosis    a. s/p TAVR 04/2018.   Stroke [facility]) 2008    SURGICAL HISTORY: Past Surgical History:  Procedure Laterality Date   ABDOMINAL HYSTERECTOMY     BILIARY BRUSHING  10/16/2023   Procedure: BILIARY BRUSHING;  Surgeon: [name], MD;  Location: [facility] ENDOSCOPY;  Service: Gastroenterology;;   BILIARY STENT PLACEMENT N/A 10/16/2023   Procedure: BILIARY STENT PLACEMENT;  Surgeon: [name], MD;  Location: [facility] ENDOSCOPY;  Service: Gastroenterology;  Laterality: N/A;   BIOPSY  05/19/2019   Procedure: BIOPSY;  Surgeon: Bernette Redbird, MD;  Location: [facility] ENDOSCOPY;  Service: Endoscopy;;   BREAST BIOPSY Left    years ago at "a doctor's office"   COLONOSCOPY     COLONOSCOPY WITH PROPOFOL N/A 10/02/2018   Procedure: COLONOSCOPY WITH PROPOFOL;  Surgeon: [name], MD;  Location: [facility] ENDOSCOPY;  Service: Endoscopy;  Laterality:  N/A;   ENTEROSCOPY N/A 05/19/2019   Procedure: ENTEROSCOPY;  Surgeon: Bernette Redbird, MD;  Location: [facility] ENDOSCOPY;  Service: Endoscopy;  Laterality: N/A;   ERCP N/A 10/16/2023   Procedure: ENDOSCOPIC RETROGRADE CHOLANGIOPANCREATOGRAPHY (ERCP);  Surgeon: [name], MD;  Location: [facility] ENDOSCOPY;  Service: Gastroenterology;  Laterality: N/A;   ESOPHAGOGASTRODUODENOSCOPY (EGD) WITH PROPOFOL N/A 10/02/2018   Procedure: ESOPHAGOGASTRODUODENOSCOPY (EGD) WITH PROPOFOL;  Surgeon: [name], MD;  Location: [facility] ENDOSCOPY;  Service: Endoscopy;  Laterality: N/A;   ESOPHAGOGASTRODUODENOSCOPY (EGD) WITH PROPOFOL N/A 04/23/2019   Procedure: ESOPHAGOGASTRODUODENOSCOPY (EGD) WITH PROPOFOL;  Surgeon: Graylin Shiver, MD;  Location: [facility] ENDOSCOPY;  Service: Endoscopy;  Laterality: N/A;   ESOPHAGOGASTRODUODENOSCOPY (EGD) WITH PROPOFOL N/A 10/16/2023   Procedure: ESOPHAGOGASTRODUODENOSCOPY (EGD) WITH PROPOFOL;  Surgeon: [name], MD;  Location: [facility] ENDOSCOPY;  Service: Gastroenterology;  Laterality: N/A;   EUS N/A 10/16/2023   Procedure: FULL UPPER ENDOSCOPIC ULTRASOUND (EUS) RADIAL;  Surgeon: [name], MD;  Location: [facility] ENDOSCOPY;  Service: Gastroenterology;  Laterality: N/A;   EYE SURGERY Bilateral    cataract removal   FINE NEEDLE ASPIRATION N/A 10/16/2023   Procedure: FINE NEEDLE ASPIRATION (FNA) LINEAR;  Surgeon: [name], MD;  Location: [facility] ENDOSCOPY;  Service: Gastroenterology;  Laterality: N/A;   GIVENS CAPSULE STUDY N/A 10/02/2018   Procedure: GIVENS CAPSULE STUDY;  Surgeon: [name], MD;  Location: [facility] ENDOSCOPY;  Service: Endoscopy;  Laterality: N/A;   RIGHT/LEFT HEART CATH AND CORONARY ANGIOGRAPHY N/A 03/06/2018   Procedure: RIGHT/LEFT HEART CATH AND CORONARY ANGIOGRAPHY;  Surgeon: Lyn Records, MD;  Location: [facility] INVASIVE CV LAB;  Service: Cardiovascular;  Laterality: N/A;   SMALL BOWEL ENTEROSCOPY  05/19/2019   SPHINCTEROTOMY  10/16/2023   Procedure: SPHINCTEROTOMY;  Surgeon:  [name], MD;  Location: [facility] ENDOSCOPY;  Service: Gastroenterology;;   TEE WITHOUT CARDIOVERSION N/A 04/01/2018   Procedure: TRANSESOPHAGEAL ECHOCARDIOGRAM (TEE);  Surgeon: Jake Bathe, MD;  Location: [facility] ENDOSCOPY;  Service: Cardiovascular;  Laterality: N/A;   TEE WITHOUT CARDIOVERSION N/A 04/15/2018   Procedure: TRANSESOPHAGEAL ECHOCARDIOGRAM (TEE);  Surgeon: Tonny Bollman, MD;  Location: [facility] OR;  Service: Open Heart Surgery;  Laterality: N/A;   TOTAL KNEE ARTHROPLASTY Right 04/14/2013   Dr August Saucer   TOTAL KNEE ARTHROPLASTY Right 04/14/2013   Procedure: TOTAL KNEE ARTHROPLASTY;  Surgeon: Cammy Copa, MD;  Location[facility] OR;  Service: Orthopedics;  Laterality: Right;   TRANSCATHETER AORTIC VALVE REPLACEMENT, TRANSFEMORAL  04/15/2018   TRANSCATHETER AORTIC VALVE REPLACEMENT, TRANSFEMORAL Bilateral 04/15/2018   Procedure: TRANSCATHETER AORTIC VALVE REPLACEMENT, TRANSFEMORAL;  Surgeon: Tonny Bollman, MD;  Location: [facility] OR;  Service: Open Heart Surgery;  Laterality: Bilateral;    I have reviewed the social history and family history with the patient and they are unchanged from previous note.  ALLERGIES:  is allergic to ace inhibitors, keflex [cephalexin], and rifadin [rifampin].  MEDICATIONS:  Current Outpatient Medications  Medication Sig Dispense  Refill   acetaminophen (TYLENOL) 325 MG tablet Take 325-650 mg by mouth every 8 (eight) hours as needed (for headaches).     amLODipine (NORVASC) 5 MG tablet Take 5 mg by mouth daily.     Blood Glucose Monitoring Suppl (ACCU-CHEK GUIDE ME) w/Device KIT by Does not apply route.     Blood Glucose Monitoring Suppl (ONE TOUCH ULTRA 2) w/Device KIT Use to check blood sugars three times daily DX: E11.9 1 kit 0   Cholecalciferol (VITAMIN D3) 5000 units CAPS Take 5,000 Units by mouth daily.     hydrochlorothiazide (HYDRODIURIL) 25 MG tablet Take 25 mg by mouth daily.      hydrOXYzine (ATARAX/VISTARIL) 25 MG tablet Take 25 mg by mouth 3 (three) times  daily as needed.     Lancets (ONETOUCH DELICA PLUS LANCET33G) MISC TEST BLOOD SUGAR THREE TIMES DAILY 300 each 10   loperamide (IMODIUM) 2 MG capsule Take 1 capsule (2 mg total) by mouth as needed for diarrhea or loose stools. 30 capsule 1   metFORMIN (GLUCOPHAGE-XR) 750 MG 24 hr tablet TAKE 2 TABLETS ONE TIME DAILY WITH BREAKFAST 180 tablet 2   metoprolol succinate (TOPROL-XL) 50 MG 24 hr tablet Take 50 mg by mouth daily.      ondansetron (ZOFRAN) 8 MG tablet Take 1 tablet (8 mg total) by mouth every 8 (eight) hours as needed for nausea or vomiting. 20 tablet 3   ONETOUCH ULTRA TEST test strip TEST BLOOD SUGAR THREE TIMES DAILY 300 strip 3   Polyethyl Glycol-Propyl Glycol (SYSTANE OP) Place 1 drop into both eyes 2 (two) times daily.      potassium chloride SA (KLOR-CON M) 20 MEQ tablet Take 1 tablet (20 mEq total) by mouth 2 (two) times daily. 60 tablet 0   prochlorperazine (COMPAZINE) 10 MG tablet Take 1 tablet (10 mg total) by mouth every 6 (six) hours as needed for nausea or vomiting. 20 tablet 3   simvastatin (ZOCOR) 20 MG tablet Take 20 mg by mouth every evening.     No current facility-administered medications for this visit.   Facility-Administered Medications Ordered in Other Visits  Medication Dose Route Frequency Provider Last Rate Last Admin   0.9 %  sodium chloride infusion   Intravenous Continuous Malachy Mood, MD   Stopped at 12/11/23 1305   0.9 %  sodium chloride infusion   Intravenous Continuous Malachy Mood, MD   Stopped at 12/11/23 1304    PHYSICAL EXAMINATION: ECOG PERFORMANCE STATUS: 1 - Symptomatic but completely ambulatory  Vitals:   12/11/23 1000  BP: (!) 160/76  Pulse: 72  Resp: 17  Temp: 98.3 F (36.8 C)  SpO2: 96%   Wt Readings from Last 3 Encounters:  12/11/23 158 lb 9.6 oz (71.9 kg)  11/20/23 158 lb 1.6 oz (71.7 kg)  11/13/23 158 lb 0.6 oz (71.7 kg)     GENERAL:alert, no distress and comfortable SKIN: skin color, texture, turgor are normal, no rashes or  significant lesions EYES: normal, Conjunctiva are pink and non-injected, sclera clear NECK: supple, thyroid normal size, non-tender, without nodularity LYMPH:  no palpable lymphadenopathy in the cervical, axillary  LUNGS: clear to auscultation and percussion with normal breathing effort HEART: regular rate & rhythm and no murmurs and no lower extremity edema ABDOMEN:abdomen soft, non-tender and normal bowel sounds Musculoskeletal:no cyanosis of digits and no clubbing  NEURO: alert & oriented x 3 with fluent speech, no focal motor/sensory deficits    LABORATORY DATA:  I have reviewed the data as  listed    Latest Ref Rng & Units 12/11/2023    9:27 AM 11/20/2023   12:40 PM 11/12/2023    8:55 AM  CBC  WBC 4.0 - 10.5 K/uL 8.6  6.6  8.2   Hemoglobin 12.0 - 15.0 g/dL 65.7  84.6  96.2   Hematocrit 36.0 - 46.0 % 33.4  33.1  34.5   Platelets 150 - 400 K/uL 398  229  304         Latest Ref Rng & Units 12/11/2023    9:27 AM 11/20/2023   12:40 PM 11/12/2023    8:55 AM  CMP  Glucose 70 - 99 mg/dL 952  841  324   BUN 8 - 23 mg/dL 9  10  10    Creatinine 0.44 - 1.00 mg/dL 4.01  0.27  2.53   Sodium 135 - 145 mmol/L 136  139  139   Potassium 3.5 - 5.1 mmol/L 3.5  3.4  2.8   Chloride 98 - 111 mmol/L 101  99  99   CO2 22 - 32 mmol/L 29  35  34   Calcium 8.9 - 10.3 mg/dL 9.4  66.4  9.6   Total Protein 6.5 - 8.1 g/dL 7.4  7.5  7.3   Total Bilirubin 0.0 - 1.2 mg/dL 0.3  0.3  0.4   Alkaline Phos 38 - 126 U/L 66  78  86   AST 15 - 41 U/L 11  13  13    ALT 0 - 44 U/L 6  9  7        RADIOGRAPHIC STUDIES: I have personally reviewed the radiological images as listed and agreed with the findings in the report. No results found.    Orders Placed This Encounter  Procedures   CBC with Differential (Cancer Center Only)    Standing Status:   Future    Expected Date:   01/08/2024    Expiration Date:   01/07/2025   CMP (Cancer Center only)    Standing Status:   Future    Expected Date:   01/08/2024     Expiration Date:   01/07/2025   CBC with Differential (Cancer Center Only)    Standing Status:   Future    Expected Date:   01/22/2024    Expiration Date:   01/21/2025   CMP (Cancer Center only)    Standing Status:   Future    Expected Date:   01/22/2024    Expiration Date:   01/21/2025   All questions were answered. The patient knows to call the clinic with any problems, questions or concerns. No barriers to learning was detected. The total time spent in the appointment was 30 minutes.     Malachy Mood, MD 12/11/2023

## 2023-12-12 ENCOUNTER — Other Ambulatory Visit: Payer: Self-pay

## 2023-12-12 LAB — CANCER ANTIGEN 19-9: CA 19-9: <2 U/mL (ref 0–35)

## 2023-12-24 NOTE — Assessment & Plan Note (Signed)
 T3N0 by EUS, questionable peritoneal carcinomatosis -She had an episode of abdominal pain, nausea, and dizziness at church and came to ED 08/18/2023 -CT AP done 10/08/2023 showing a stable solid right lower lobe pulmonary nodule from 2019 and an ill-defined hypodense lesion in the pancreatic head measuring 4.4 x 3.9 x 3.3 cm obstructing the pancreatic and CBD appearing to encase and occlude the SMV and portal confluence with soft tissue stranding extending along the mesenteric root with nodular foci peritoneal soft tissue nodularity anterior to the mass; overall concerning for primary pancreatic malignancy with peritoneal carcinomatosis.    -EUS 10/16/23 by Dr. Dulce Sellar showing a T3 N0 pancreatic head mass invading the SMA and ERCP with stenting by Dr. Ewing Schlein, cytology confirming adenocarcinoma.  -Due to her age, the vascular invasion/abutment of the SMA, and possible peritoneal involvement, she is likely not a surgical candidate but Dr. Donell Beers has reviewed her case and agreed to see the pt for surgical discussion. She was seen on 11/26/2023 and felt to be a poor candidate for surgery  -PET scan showed no definite evidence of metastatic disease.  The questionable peritoneal involvement on CT is felt to be local extension -plan to start chemo gemcitabine alone, and may add Abraxane from second cycle if tolerates well. She started chemo on 11/13/2023

## 2023-12-25 ENCOUNTER — Encounter: Payer: Self-pay | Admitting: Hematology

## 2023-12-25 ENCOUNTER — Inpatient Hospital Stay: Payer: Medicare HMO

## 2023-12-25 ENCOUNTER — Inpatient Hospital Stay (HOSPITAL_BASED_OUTPATIENT_CLINIC_OR_DEPARTMENT_OTHER): Payer: Medicare HMO | Admitting: Hematology

## 2023-12-25 ENCOUNTER — Inpatient Hospital Stay: Admitting: Dietician

## 2023-12-25 VITALS — BP 138/52 | HR 68 | Temp 98.2°F | Resp 20 | Ht 63.0 in | Wt 155.9 lb

## 2023-12-25 VITALS — BP 151/64 | HR 69 | Resp 18

## 2023-12-25 DIAGNOSIS — Z7984 Long term (current) use of oral hypoglycemic drugs: Secondary | ICD-10-CM | POA: Diagnosis not present

## 2023-12-25 DIAGNOSIS — C250 Malignant neoplasm of head of pancreas: Secondary | ICD-10-CM

## 2023-12-25 DIAGNOSIS — Z5111 Encounter for antineoplastic chemotherapy: Secondary | ICD-10-CM | POA: Diagnosis not present

## 2023-12-25 DIAGNOSIS — D649 Anemia, unspecified: Secondary | ICD-10-CM | POA: Diagnosis not present

## 2023-12-25 DIAGNOSIS — E1165 Type 2 diabetes mellitus with hyperglycemia: Secondary | ICD-10-CM | POA: Diagnosis not present

## 2023-12-25 DIAGNOSIS — Z79899 Other long term (current) drug therapy: Secondary | ICD-10-CM | POA: Diagnosis not present

## 2023-12-25 LAB — CBC WITH DIFFERENTIAL (CANCER CENTER ONLY)
Abs Immature Granulocytes: 0.03 10*3/uL (ref 0.00–0.07)
Basophils Absolute: 0.1 10*3/uL (ref 0.0–0.1)
Basophils Relative: 1 %
Eosinophils Absolute: 0.1 10*3/uL (ref 0.0–0.5)
Eosinophils Relative: 2 %
HCT: 33.9 % — ABNORMAL LOW (ref 36.0–46.0)
Hemoglobin: 10.5 g/dL — ABNORMAL LOW (ref 12.0–15.0)
Immature Granulocytes: 0 %
Lymphocytes Relative: 28 %
Lymphs Abs: 2.1 10*3/uL (ref 0.7–4.0)
MCH: 23.9 pg — ABNORMAL LOW (ref 26.0–34.0)
MCHC: 31 g/dL (ref 30.0–36.0)
MCV: 77.2 fL — ABNORMAL LOW (ref 80.0–100.0)
Monocytes Absolute: 0.5 10*3/uL (ref 0.1–1.0)
Monocytes Relative: 7 %
Neutro Abs: 4.7 10*3/uL (ref 1.7–7.7)
Neutrophils Relative %: 62 %
Platelet Count: 290 10*3/uL (ref 150–400)
RBC: 4.39 MIL/uL (ref 3.87–5.11)
RDW: 18.2 % — ABNORMAL HIGH (ref 11.5–15.5)
WBC Count: 7.4 10*3/uL (ref 4.0–10.5)
nRBC: 0 % (ref 0.0–0.2)

## 2023-12-25 LAB — CMP (CANCER CENTER ONLY)
ALT: 10 U/L (ref 0–44)
AST: 13 U/L — ABNORMAL LOW (ref 15–41)
Albumin: 3.7 g/dL (ref 3.5–5.0)
Alkaline Phosphatase: 63 U/L (ref 38–126)
Anion gap: 5 (ref 5–15)
BUN: 12 mg/dL (ref 8–23)
CO2: 33 mmol/L — ABNORMAL HIGH (ref 22–32)
Calcium: 10.1 mg/dL (ref 8.9–10.3)
Chloride: 100 mmol/L (ref 98–111)
Creatinine: 0.61 mg/dL (ref 0.44–1.00)
GFR, Estimated: >60 mL/min (ref >60)
Glucose, Bld: 274 mg/dL — ABNORMAL HIGH (ref 70–99)
Potassium: 4 mmol/L (ref 3.5–5.1)
Sodium: 138 mmol/L (ref 135–145)
Total Bilirubin: 0.3 mg/dL (ref 0.0–1.2)
Total Protein: 7.4 g/dL (ref 6.5–8.1)

## 2023-12-25 MED ORDER — SODIUM CHLORIDE 0.9 % IV SOLN
1000.0000 mg/m2 | Freq: Once | INTRAVENOUS | Status: AC
  Administered 2023-12-25: 1824 mg via INTRAVENOUS
  Filled 2023-12-25: qty 47.97

## 2023-12-25 MED ORDER — SODIUM CHLORIDE 0.9 % IV SOLN: INTRAVENOUS | Status: DC

## 2023-12-25 MED ORDER — PROCHLORPERAZINE MALEATE 10 MG PO TABS
10.0000 mg | ORAL_TABLET | Freq: Once | ORAL | Status: AC
  Administered 2023-12-25: 10 mg via ORAL
  Filled 2023-12-25: qty 1

## 2023-12-25 NOTE — Progress Notes (Signed)
 Nutrition Follow-up:  Patient with pancreatic cancer. She is currently receiving gemcitabine + abraxane (start 2/5; abraxane added 3/5).   Met with pt and daughter in infusion. Pt reports having a rough time after last treatment. She endorses 4 days of extreme fatigue, aching knees, dizziness, heart palpitations. Patient had no appetite during this time as well as poor hydration. Daughter says she missed her birthday due to feeling so poorly. Dr. Mosetta Putt has reduced dose today. Pt reports improved BM with Creon. She is taking one with meals. Sometimes she has cramping after meals. This resolves after BM. Patient denies nausea, vomiting, diarrhea, constipation.    Medications: reviewed   Labs: glucose 274  Anthropometrics: Wt 155 lb 14.4 oz today decreased 2% in 2 weeks - significant   3/5 - 158 lb 9.6 oz    NUTRITION DIAGNOSIS: Unintended wt loss continues    INTERVENTION:  Recommend increasing Creon to 3 at meal times, 2 with snacks as prescribed per Dr. Donell Beers Encouraged ensure complete 2-3/day on days with poor po Encourage pt to contact triage line if having dizziness/heart palpitations    MONITORING, EVALUATION, GOAL: wt trends, intake   NEXT VISIT: Wednesday April 9 during infusion

## 2023-12-25 NOTE — Progress Notes (Signed)
 [facility]   [phone] [phone]   Clinic Follow up Note   Patient Care Team: Irven Coe, MD as PCP - General (Family Medicine) Jake Bathe, MD as PCP - Cardiology (Cardiology) Malachy Mood, MD as Consulting Physician (Hematology) [name], MD as Consulting Physician (Gastroenterology) Vida Rigger, MD as Consulting Physician (Gastroenterology)  Date of Service:  12/25/2023  CHIEF COMPLAINT: f/u of pancreatic cancer  CURRENT THERAPY:  Neoadjuvant chemotherapy gemcitabine and Abraxane every 2 weeks  Oncology History   Malignant neoplasm of head of pancreas (HCC) T3N0 by EUS, questionable peritoneal carcinomatosis -She had an episode of abdominal pain, nausea, and dizziness at church and came to ED 08/18/2023 -CT AP done 10/08/2023 showing a stable solid right lower lobe pulmonary nodule from 2019 and an ill-defined hypodense lesion in the pancreatic head measuring 4.4 x 3.9 x 3.3 cm obstructing the pancreatic and CBD appearing to encase and occlude the SMV and portal confluence with soft tissue stranding extending along the mesenteric root with nodular foci peritoneal soft tissue nodularity anterior to the mass; overall concerning for primary pancreatic malignancy with peritoneal carcinomatosis.    -EUS 10/16/23 by Dr. Dulce Sellar showing a T3 N0 pancreatic head mass invading the SMA and ERCP with stenting by Dr. Ewing Schlein, cytology confirming adenocarcinoma.  -Due to her age, the vascular invasion/abutment of the SMA, and possible peritoneal involvement, she is likely not a surgical candidate but Dr. Donell Beers has reviewed her case and agreed to see the pt for surgical discussion. She was seen on 11/26/2023 and felt to be a poor candidate for surgery  -PET scan showed no definite evidence of metastatic disease.  The questionable peritoneal involvement on CT is felt to be local extension -plan to start chemo gemcitabine alone, and may add Abraxane from second  cycle if tolerates well. She started chemo on 11/13/2023    Assessment and Plan    Pancreatic cancer Undergoing chemotherapy for pancreatic cancer. Experienced significant side effects from combination chemotherapy, including nausea, anorexia, asthenia, dizziness, and palpitations, lasting four days post-treatment. Prefers single-agent chemotherapy due to better tolerance. Decision made to revert to single-agent regimen with a schedule of two weeks on, one week off, as it is more tolerable for her age and condition. The previous regimen was more effective but too harsh for her. - Administer single-agent chemotherapy with a schedule of two weeks on, one week off. - Schedule the next chemotherapy session to start on April 2nd. - Order a repeat scan in early May to assess treatment response. - Advise the use of ginger products to help manage nausea.  Diabetes mellitus Blood glucose level elevated at 274 mg/dL. Consumed a breakfast high in carbohydrates and sugar, including pancakes and Ensure, contributing to elevated glucose level. Advised on dietary modifications to better manage diabetes. - Advise switching from Ensure to Glucerna, which has less sugar. - Monitor blood glucose levels and adjust diet accordingly.     Plan -Due to her poor tolerance to gemcitabine and Abraxane, we will change her chemotherapy back to gemcitabine alone -Follow-up in 2 weeks to start cycle 3 gemcitabine    SUMMARY OF ONCOLOGIC HISTORY: Oncology History  Malignant neoplasm of head of pancreas (HCC)  10/16/2023 Cancer Staging   Staging form: Exocrine Pancreas, AJCC 8th Edition - Clinical stage from 10/16/2023: Stage IIA (cT3, cN0, cM0) - Signed by Malachy Mood, MD on 11/20/2023 Total positive nodes: 0   10/24/2023 Initial Diagnosis   Malignant neoplasm of head of pancreas (HCC)  11/13/2023 -  Chemotherapy   Patient is on Treatment Plan : PANCREATIC Abraxane D1,8,15 + Gemcitabine D1,8,15 q28d     12/04/2023 Genetic  Testing   Negative genetic testing on the CancerNext+RNA panel.  BRCA1  p.D695N (c.2083G>A)  VUS identified.  The report date is 12/03/2023.  The [facility] CancerNext+RNAinsight Panel includes sequencing, rearrangement analysis, and RNA analysis for the following 39 genes: APC, ATM, BAP1, BARD1, BMPR1A, BRCA1, BRCA2, BRIP1, CDH1, CDKN2A, CHEK2, FH, FLCN, MET, MLH1, MSH2, MSH6, MUTYH, NF1, NTHL1, PALB2, PMS2, PTEN, RAD51C, RAD51D, SMAD4, STK11, TP53, TSC1, TSC2, and VHL (sequencing and deletion/duplication); AXIN2, HOXB13, MBD4, MSH3, POLD1 and POLE (sequencing only); EPCAM and GREM1 (deletion/duplication only).       Discussed the use of AI scribe software for clinical note transcription with the patient, who gave verbal consent to proceed.  History of Present Illness   The patient, an 83 year old female with a history of pancreatic cancer, presents for a follow-up visit after her recent chemotherapy session. She reports experiencing severe side effects from the treatment, including nausea, loss of appetite, weakness, dizziness, and heart palpitations. These symptoms lasted for four days, after which she began to recover. She also reports knee pain, which may or may not be related to the chemotherapy. The patient has a history of diabetes, as indicated by a discussion about her high blood sugar levels. She also appears to have some mobility issues, as she arrived at the clinic in a wheelchair.         All other systems were reviewed with the patient and are negative.  MEDICAL HISTORY:  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 Not seen on most recent TEE 2019.   Cardiomyopathy in other disease    Cataracts, bilateral    Diabetes mellitus    Type 2   Generalized osteoarthritis    GERD (gastroesophageal reflux disease)    HLD (hyperlipidemia)    Hypertension    Hypothyroidism    LBBB (left  bundle branch block)    PAF (paroxysmal atrial fibrillation) (HCC)    a. s/p DCCV 06/2017, on Xarelto   Phlebitis    S/P TAVR (transcatheter aortic valve replacement) 04/15/2018   Edwards Sapien 3 THV (size 23 mm, model # 9600TFX, serial # [id number]) via the TF approach   Severe aortic stenosis    a. s/p TAVR 04/2018.   Stroke [facility]) 2008    SURGICAL HISTORY: Past Surgical History:  Procedure Laterality Date   ABDOMINAL HYSTERECTOMY     BILIARY BRUSHING  10/16/2023   Procedure: BILIARY BRUSHING;  Surgeon: [name], MD;  Location: [facility] ENDOSCOPY;  Service: Gastroenterology;;   BILIARY STENT PLACEMENT N/A 10/16/2023   Procedure: BILIARY STENT PLACEMENT;  Surgeon: [name], MD;  Location: [facility] ENDOSCOPY;  Service: Gastroenterology;  Laterality: N/A;   BIOPSY  05/19/2019   Procedure: BIOPSY;  Surgeon: Bernette Redbird, MD;  Location: [facility] ENDOSCOPY;  Service: Endoscopy;;   BREAST BIOPSY Left    years ago at "a doctor's office"   COLONOSCOPY     COLONOSCOPY WITH PROPOFOL N/A 10/02/2018   Procedure: COLONOSCOPY WITH PROPOFOL;  Surgeon: [name], MD;  Location: [facility] ENDOSCOPY;  Service: Endoscopy;  Laterality: N/A;   ENTEROSCOPY N/A 05/19/2019   Procedure: ENTEROSCOPY;  Surgeon: Bernette Redbird, MD;  Location: [facility] ENDOSCOPY;  Service: Endoscopy;  Laterality: N/A;   ERCP N/A 10/16/2023   Procedure:  ENDOSCOPIC RETROGRADE CHOLANGIOPANCREATOGRAPHY (ERCP);  Surgeon: [name], MD;  Location: [facility] ENDOSCOPY;  Service: Gastroenterology;  Laterality: N/A;   ESOPHAGOGASTRODUODENOSCOPY (EGD) WITH PROPOFOL N/A 10/02/2018   Procedure: ESOPHAGOGASTRODUODENOSCOPY (EGD) WITH PROPOFOL;  Surgeon: [name], MD;  Location: [facility] ENDOSCOPY;  Service: Endoscopy;  Laterality: N/A;   ESOPHAGOGASTRODUODENOSCOPY (EGD) WITH PROPOFOL N/A 04/23/2019   Procedure: ESOPHAGOGASTRODUODENOSCOPY (EGD) WITH PROPOFOL;  Surgeon: Graylin Shiver, MD;  Location: [facility] ENDOSCOPY;  Service: Endoscopy;  Laterality: N/A;    ESOPHAGOGASTRODUODENOSCOPY (EGD) WITH PROPOFOL N/A 10/16/2023   Procedure: ESOPHAGOGASTRODUODENOSCOPY (EGD) WITH PROPOFOL;  Surgeon: [name], MD;  Location: [facility] ENDOSCOPY;  Service: Gastroenterology;  Laterality: N/A;   EUS N/A 10/16/2023   Procedure: FULL UPPER ENDOSCOPIC ULTRASOUND (EUS) RADIAL;  Surgeon: [name], MD;  Location: [facility] ENDOSCOPY;  Service: Gastroenterology;  Laterality: N/A;   EYE SURGERY Bilateral    cataract removal   FINE NEEDLE ASPIRATION N/A 10/16/2023   Procedure: FINE NEEDLE ASPIRATION (FNA) LINEAR;  Surgeon: [name], MD;  Location: [facility] ENDOSCOPY;  Service: Gastroenterology;  Laterality: N/A;   GIVENS CAPSULE STUDY N/A 10/02/2018   Procedure: GIVENS CAPSULE STUDY;  Surgeon: [name], MD;  Location: [facility] ENDOSCOPY;  Service: Endoscopy;  Laterality: N/A;   RIGHT/LEFT HEART CATH AND CORONARY ANGIOGRAPHY N/A 03/06/2018   Procedure: RIGHT/LEFT HEART CATH AND CORONARY ANGIOGRAPHY;  Surgeon: Lyn Records, MD;  Location: [facility] INVASIVE CV LAB;  Service: Cardiovascular;  Laterality: N/A;   SMALL BOWEL ENTEROSCOPY  05/19/2019   SPHINCTEROTOMY  10/16/2023   Procedure: SPHINCTEROTOMY;  Surgeon: [name], MD;  Location: [facility] ENDOSCOPY;  Service: Gastroenterology;;   TEE WITHOUT CARDIOVERSION N/A 04/01/2018   Procedure: TRANSESOPHAGEAL ECHOCARDIOGRAM (TEE);  Surgeon: Jake Bathe, MD;  Location: [facility] ENDOSCOPY;  Service: Cardiovascular;  Laterality: N/A;   TEE WITHOUT CARDIOVERSION N/A 04/15/2018   Procedure: TRANSESOPHAGEAL ECHOCARDIOGRAM (TEE);  Surgeon: Tonny Bollman, MD;  Location: [facility] OR;  Service: Open Heart Surgery;  Laterality: N/A;   TOTAL KNEE ARTHROPLASTY Right 04/14/2013   Dr August Saucer   TOTAL KNEE ARTHROPLASTY Right 04/14/2013   Procedure: TOTAL KNEE ARTHROPLASTY;  Surgeon: Cammy Copa, MD;  Location: [facility] OR;  Service: Orthopedics;  Laterality: Right;   TRANSCATHETER AORTIC VALVE REPLACEMENT, TRANSFEMORAL  04/15/2018   TRANSCATHETER AORTIC VALVE  REPLACEMENT, TRANSFEMORAL Bilateral 04/15/2018   Procedure: TRANSCATHETER AORTIC VALVE REPLACEMENT, TRANSFEMORAL;  Surgeon: Tonny Bollman, MD;  Location: [facility] OR;  Service: Open Heart Surgery;  Laterality: Bilateral;    I have reviewed the social history and family history with the patient and they are unchanged from previous note.  ALLERGIES:  is allergic to ace inhibitors, keflex [cephalexin], and rifadin [rifampin].  MEDICATIONS:  Current Outpatient Medications  Medication Sig Dispense Refill   acetaminophen (TYLENOL) 325 MG tablet Take 325-650 mg by mouth every 8 (eight) hours as needed (for headaches).     amLODipine (NORVASC) 5 MG tablet Take 5 mg by mouth daily.     Blood Glucose Monitoring Suppl (ACCU-CHEK GUIDE ME) w/Device KIT by Does not apply route.     Blood Glucose Monitoring Suppl (ONE TOUCH ULTRA 2) w/Device KIT Use to check blood sugars three times daily DX: E11.9 1 kit 0   Cholecalciferol (VITAMIN D3) 5000 units CAPS Take 5,000 Units by mouth daily.     hydrochlorothiazide (HYDRODIURIL) 25 MG tablet Take 25 mg by mouth daily.      hydrOXYzine (ATARAX/VISTARIL) 25 MG tablet Take 25 mg by mouth 3 (three) times daily as needed.     Lancets [facility] DELICA PLUS  LANCET33G) MISC TEST BLOOD SUGAR THREE TIMES DAILY 300 each 10   loperamide (IMODIUM) 2 MG capsule Take 1 capsule (2 mg total) by mouth as needed for diarrhea or loose stools. 30 capsule 1   metFORMIN (GLUCOPHAGE-XR) 750 MG 24 hr tablet TAKE 2 TABLETS ONE TIME DAILY WITH BREAKFAST 180 tablet 2   metoprolol succinate (TOPROL-XL) 50 MG 24 hr tablet Take 50 mg by mouth daily.      ondansetron (ZOFRAN) 8 MG tablet Take 1 tablet (8 mg total) by mouth every 8 (eight) hours as needed for nausea or vomiting. 20 tablet 3   ONETOUCH ULTRA TEST test strip TEST BLOOD SUGAR THREE TIMES DAILY 300 strip 3   Polyethyl Glycol-Propyl Glycol (SYSTANE OP) Place 1 drop into both eyes 2 (two) times daily.      potassium chloride SA (KLOR-CON  M) 20 MEQ tablet Take 1 tablet (20 mEq total) by mouth 2 (two) times daily. 60 tablet 0   prochlorperazine (COMPAZINE) 10 MG tablet Take 1 tablet (10 mg total) by mouth every 6 (six) hours as needed for nausea or vomiting. 20 tablet 3   simvastatin (ZOCOR) 20 MG tablet Take 20 mg by mouth every evening.     No current facility-administered medications for this visit.   Facility-Administered Medications Ordered in Other Visits  Medication Dose Route Frequency Provider Last Rate Last Admin   0.9 %  sodium chloride infusion   Intravenous Continuous Malachy Mood, MD   Stopped at 12/11/23 1305   0.9 %  sodium chloride infusion   Intravenous Continuous Malachy Mood, MD   Stopped at 12/11/23 1304    PHYSICAL EXAMINATION: ECOG PERFORMANCE STATUS: 1 - Symptomatic but completely ambulatory  Vitals:   12/25/23 1216  BP: (!) 138/52  Pulse: 68  Resp: 20  Temp: 98.2 F (36.8 C)  SpO2: 99%   Wt Readings from Last 3 Encounters:  12/25/23 155 lb 14.4 oz (70.7 kg)  12/11/23 158 lb 9.6 oz (71.9 kg)  11/20/23 158 lb 1.6 oz (71.7 kg)     GENERAL:alert, no distress and comfortable SKIN: skin color, texture, turgor are normal, no rashes or significant lesions EYES: normal, Conjunctiva are pink and non-injected, sclera clear NECK: supple, thyroid normal size, non-tender, without nodularity LYMPH:  no palpable lymphadenopathy in the cervical, axillary  LUNGS: clear to auscultation and percussion with normal breathing effort HEART: regular rate & rhythm and no murmurs and no lower extremity edema ABDOMEN:abdomen soft, non-tender and normal bowel sounds Musculoskeletal:no cyanosis of digits and no clubbing  NEURO: alert & oriented x 3 with fluent speech, no focal motor/sensory deficits      LABORATORY DATA:  I have reviewed the data as listed    Latest Ref Rng & Units 12/25/2023   11:30 AM 12/11/2023    9:27 AM 11/20/2023   12:40 PM  CBC  WBC 4.0 - 10.5 K/uL 7.4  8.6  6.6   Hemoglobin 12.0 - 15.0 g/dL  91.4  78.2  95.6   Hematocrit 36.0 - 46.0 % 33.9  33.4  33.1   Platelets 150 - 400 K/uL 290  398  229         Latest Ref Rng & Units 12/25/2023   11:30 AM 12/11/2023    9:27 AM 11/20/2023   12:40 PM  CMP  Glucose 70 - 99 mg/dL 213  086  578   BUN 8 - 23 mg/dL 12  9  10    Creatinine 0.44 - 1.00 mg/dL 4.69  0.52  0.58   Sodium 135 - 145 mmol/L 138  136  139   Potassium 3.5 - 5.1 mmol/L 4.0  3.5  3.4   Chloride 98 - 111 mmol/L 100  101  99   CO2 22 - 32 mmol/L 33  29  35   Calcium 8.9 - 10.3 mg/dL 95.2  9.4  84.1   Total Protein 6.5 - 8.1 g/dL 7.4  7.4  7.5   Total Bilirubin 0.0 - 1.2 mg/dL 0.3  0.3  0.3   Alkaline Phos 38 - 126 U/L 63  66  78   AST 15 - 41 U/L 13  11  13    ALT 0 - 44 U/L 10  6  9        RADIOGRAPHIC STUDIES: I have personally reviewed the radiological images as listed and agreed with the findings in the report. No results found.    Orders Placed This Encounter  Procedures   CBC with Differential (Cancer Center Only)    Standing Status:   Future    Expected Date:   01/08/2024    Expiration Date:   01/07/2025   CMP (Cancer Center only)    Standing Status:   Future    Expected Date:   01/08/2024    Expiration Date:   01/07/2025   CBC with Differential (Cancer Center Only)    Standing Status:   Future    Expected Date:   01/15/2024    Expiration Date:   01/14/2025   CMP (Cancer Center only)    Standing Status:   Future    Expected Date:   01/15/2024    Expiration Date:   01/14/2025   CBC with Differential (Cancer Center Only)    Standing Status:   Future    Expected Date:   02/05/2024    Expiration Date:   02/04/2025   CMP (Cancer Center only)    Standing Status:   Future    Expected Date:   02/05/2024    Expiration Date:   02/04/2025   CBC with Differential (Cancer Center Only)    Standing Status:   Future    Expected Date:   02/12/2024    Expiration Date:   02/11/2025   CMP (Cancer Center only)    Standing Status:   Future    Expected Date:   02/12/2024    Expiration  Date:   02/11/2025   All questions were answered. The patient knows to call the clinic with any problems, questions or concerns. No barriers to learning was detected. The total time spent in the appointment was 30 minutes.     Malachy Mood, MD 12/25/2023

## 2023-12-25 NOTE — Patient Instructions (Signed)
 [facility]  Discharge Instructions: Thank you for choosing [facility] to provide your oncology and hematology care.   If you have a lab appointment with the Cancer Center, please go directly to the Cancer Center and check in at the registration area.   Wear comfortable clothing and clothing appropriate for easy access to any Portacath or PICC line.   We strive to give you quality time with your provider. You may need to reschedule your appointment if you arrive late (15 or more minutes).  Arriving late affects you and other patients whose appointments are after yours.  Also, if you miss three or more appointments without notifying the office, you may be dismissed from the clinic at the provider's discretion.      For prescription refill requests, have your pharmacy contact our office and allow 72 hours for refills to be completed.    Today you received the following chemotherapy and/or immunotherapy agents Gemcitabine.      To help prevent nausea and vomiting after your treatment, we encourage you to take your nausea medication as directed.  BELOW ARE SYMPTOMS THAT SHOULD BE REPORTED IMMEDIATELY: *FEVER GREATER THAN 100.4 F (38 C) OR HIGHER *CHILLS OR SWEATING *NAUSEA AND VOMITING THAT IS NOT CONTROLLED WITH YOUR NAUSEA MEDICATION *UNUSUAL SHORTNESS OF BREATH *UNUSUAL BRUISING OR BLEEDING *URINARY PROBLEMS (pain or burning when urinating, or frequent urination) *BOWEL PROBLEMS (unusual diarrhea, constipation, pain near the anus) TENDERNESS IN MOUTH AND THROAT WITH OR WITHOUT PRESENCE OF ULCERS (sore throat, sores in mouth, or a toothache) UNUSUAL RASH, SWELLING OR PAIN  UNUSUAL VAGINAL DISCHARGE OR ITCHING   Items with * indicate a potential emergency and should be followed up as soon as possible or go to the Emergency Department if any problems should occur.  Please show the CHEMOTHERAPY ALERT CARD or  IMMUNOTHERAPY ALERT CARD at check-in to the Emergency Department and triage nurse.  Should you have questions after your visit or need to cancel or reschedule your appointment, please contact [facility]  Dept: [phone]  and follow the prompts.  Office hours are 8:00 a.m. to 4:30 p.m. Monday - Friday. Please note that voicemails left after 4:00 p.m. may not be returned until the following business day.  We are closed weekends and major holidays. You have access to a nurse at all times for urgent questions. Please call the main number to the clinic Dept: [phone] and follow the prompts.   For any non-urgent questions, you may also contact your provider using MyChart. We now offer e-Visits for anyone 68 and older to request care online for non-urgent symptoms. For details visit [email or web address].   Also download the MyChart app! Go to the app store, search "MyChart", open the app, select [place], and log in with your MyChart username and password.  Gemcitabine Injection What is this medication? GEMCITABINE (jem SYE ta been) treats some types of cancer. It works by slowing down the growth of cancer cells. This medicine may be used for other purposes; ask your health care provider or pharmacist if you have questions. COMMON BRAND NAME(S): Gemzar, Infugem What should I tell my care team before I take this medication? They need to know if you have any of these conditions: Blood disorders Infection Kidney disease Liver disease Lung or breathing disease, such as asthma or COPD Recent or ongoing  radiation therapy An unusual or allergic reaction to gemcitabine, other medications, foods, dyes, or preservatives If you or your partner are pregnant or trying to get pregnant Breast-feeding How should I use this medication? This medication is injected into a vein. It is given by your care team in a hospital or clinic setting. Talk  to your care team about the use of this medication in children. Special care may be needed. Overdosage: If you think you have taken too much of this medicine contact a poison control center or emergency room at once. NOTE: This medicine is only for you. Do not share this medicine with others. What if I miss a dose? Keep appointments for follow-up doses. It is important not to miss your dose. Call your care team if you are unable to keep an appointment. What may interact with this medication? Interactions have not been studied. This list may not describe all possible interactions. Give your health care provider a list of all the medicines, herbs, non-prescription drugs, or dietary supplements you use. Also tell them if you smoke, drink alcohol, or use illegal drugs. Some items may interact with your medicine. What should I watch for while using this medication? Your condition will be monitored carefully while you are receiving this medication. This medication may make you feel generally unwell. This is not uncommon, as chemotherapy can affect healthy cells as well as cancer cells. Report any side effects. Continue your course of treatment even though you feel ill unless your care team tells you to stop. In some cases, you may be given additional medications to help with side effects. Follow all directions for their use. This medication may increase your risk of getting an infection. Call your care team for advice if you get a fever, chills, sore throat, or other symptoms of a cold or flu. Do not treat yourself. Try to avoid being around people who are sick. This medication may increase your risk to bruise or bleed. Call your care team if you notice any unusual bleeding. Be careful brushing or flossing your teeth or using a toothpick because you may get an infection or bleed more easily. If you have any dental work done, tell your dentist you are receiving this medication. Avoid taking medications that  contain aspirin, acetaminophen, ibuprofen, naproxen, or ketoprofen unless instructed by your care team. These medications may hide a fever. Talk to your care team if you or your partner wish to become pregnant or think you might be pregnant. This medication can cause serious birth defects if taken during pregnancy and for 6 months after the last dose. A negative pregnancy test is required before starting this medication. A reliable form of contraception is recommended while taking this medication and for 6 months after the last dose. Talk to your care team about effective forms of contraception. Do not father a child while taking this medication and for 3 months after the last dose. Use a condom while having sex during this time period. Do not breastfeed while taking this medication and for at least 1 week after the last dose. This medication may cause infertility. Talk to your care team if you are concerned about your fertility. What side effects may I notice from receiving this medication? Side effects that you should report to your care team as soon as possible: Allergic reactions--skin rash, itching, hives, swelling of the face, lips, tongue, or throat Capillary leak syndrome--stomach or muscle pain, unusual weakness or fatigue, feeling faint or lightheaded, decrease in  the amount of urine, swelling of the ankles, hands, or feet, trouble breathing Infection--fever, chills, cough, sore throat, wounds that don't heal, pain or trouble when passing urine, general feeling of discomfort or being unwell Liver injury--right upper belly pain, loss of appetite, nausea, light-colored stool, dark yellow or brown urine, yellowing skin or eyes, unusual weakness or fatigue Low red blood cell level--unusual weakness or fatigue, dizziness, headache, trouble breathing Lung injury--shortness of breath or trouble breathing, cough, spitting up blood, chest pain, fever Stomach pain, bloody diarrhea, pale skin, unusual  weakness or fatigue, decrease in the amount of urine, which may be signs of hemolytic uremic syndrome Sudden and severe headache, confusion, change in vision, seizures, which may be signs of posterior reversible encephalopathy syndrome (PRES) Unusual bruising or bleeding Side effects that usually do not require medical attention (report to your care team if they continue or are bothersome): Diarrhea Drowsiness Hair loss Nausea Pain, redness, or swelling with sores inside the mouth or throat Vomiting This list may not describe all possible side effects. Call your doctor for medical advice about side effects. You may report side effects to FDA at 1-800-FDA-1088. Where should I keep my medication? This medication is given in a hospital or clinic. It will not be stored at home. NOTE: This sheet is a summary. It may not cover all possible information. If you have questions about this medicine, talk to your doctor, pharmacist, or health care provider.  2024 Elsevier/Gold Standard (2022-01-30 00:00:00)

## 2023-12-26 ENCOUNTER — Other Ambulatory Visit: Payer: Self-pay

## 2023-12-26 DIAGNOSIS — C250 Malignant neoplasm of head of pancreas: Secondary | ICD-10-CM

## 2023-12-27 ENCOUNTER — Other Ambulatory Visit: Payer: Self-pay

## 2023-12-31 ENCOUNTER — Other Ambulatory Visit: Payer: Self-pay

## 2023-12-31 DIAGNOSIS — E119 Type 2 diabetes mellitus without complications: Secondary | ICD-10-CM

## 2023-12-31 DIAGNOSIS — E7800 Pure hypercholesterolemia, unspecified: Secondary | ICD-10-CM

## 2024-01-03 NOTE — Discharge Instructions (Signed)
 Implanted Port Insertion After Care   What can I expect after the procedure?   After the procedure, it is common to have:   Discomfort at the port insertion site.   Bruising on the skin over the port. This should improve over 3-4 days.    Port care   After your port is placed, you will get a manufacturer's information card. The card has information about your port. Keep this card with you at all times.   Take care of the port as told by your health care provider. Ask your health care provider if you or a family member can get training for taking care of the port at home.   Make sure to remember what type of port you have.       Incision care   Follow instructions from your health care provider about how to take care of your port insertion site. Make sure you:   Wash your hands with soap and water for at least 20 seconds before and after you change your bandage (dressing). If soap and water are not available, use hand sanitizer.        Leave your initial bandage on for a full 24 hours.     After 24 hours you may remove the dressing and shower.  Do not scrub directly on the incision site but rather above it and let the soapy water run over the incision.  Pat dry after.    You may then opt to redress your incision for your comfort but you may just leave it open to air.  The Dermabond (surgical super glue) will protect your incision and keep it clean and dry.    Leave the layer of skin glue in place. If the glue edges start to loosen and curl up, you may trim the loose edges but do not pull or pick at it.   Do NOT apply neosporin or other antibacterial ointment to the surgical glue.  It will dissolve the glue and expose your new incision to possible infection.     Do NOT apply EMLA numbing cream to the surgical glue.  It will dissolve the glue and expose your new incision to possible infection.  You may have to wait to use the EMLA cream until your incision has healed.    Check  your port insertion site every day for signs of infection. Check for:   Redness, swelling, or pain.   Fluid or blood.   Warmth.   Pus or a bad smell.      Activity   Return to your normal activities as told by your health care provider. Ask your health care provider what activities are safe for you.   You may have to avoid lifting. Ask your health care provider how much you can safely lift.   General instructions   Take over-the-counter and prescription medicines only as told by your health care provider.   Do not take baths, swim, or use a hot tub until your incision has healed completely (usually 2 weeks).    If you were given a sedative during the procedure, it can affect you for several hours. Do not drive, operate machinery or sign important documents for 24 hours after your procedure.   Keep all follow-up visits. This is important.   Please contact our office at [phone] you may also call [phone] and ask to speak with Cala Bradford, RN for the following symptoms:   You have a fever or chills.   You  have redness, swelling, or pain around your port insertion site.   You have fluid or blood coming from your port insertion site.   Your port insertion site feels warm to the touch.   You have pus or a bad smell coming from the port insertion site.   Get help right away if:   You have chest pain or shortness of breath.   You have bleeding from your port that you cannot control.   Do not wait to see if the symptoms will go away.   Do not drive yourself to the hospital.      These symptoms may be an emergency.    Get help right away. Call 911.     Summary   Take care of the port as told by your health care provider. Keep the manufacturer's information card with you at all times.   Keep your dressing on for 24 hours!  After that you can shower and redress the site only as needed.    No neosporin, antibiotic ointment or EMLA numbing cream on the glue over  your incision   Do not submerge your incision under water in a bath, pool or hot tube until fully healed   Contact a health care provider if you have a fever or chills or if you have redness, swelling, or pain around your port insertion site.   Keep all follow-up visits.   If you need to speak to someone after hours (5:00PM) please contact the on-all IR MD at [phone]. Tell them you are a patient of Dr. Loreta Ave; you had a Port placed today and issues you are experiencing.    Thank you for visiting [facility] today!

## 2024-01-03 NOTE — Progress Notes (Signed)
 Called patient and daughters numbers listed in epic several times and left a message on Lucille's voicemail to return call regarding her mother's port placement on 01/06/24

## 2024-01-06 ENCOUNTER — Ambulatory Visit
Admission: RE | Admit: 2024-01-06 | Discharge: 2024-01-06 | Disposition: A | Discharge status: Home / Self Care | Source: Ambulatory Visit | Attending: Hematology | Admitting: Hematology

## 2024-01-06 DIAGNOSIS — C250 Malignant neoplasm of head of pancreas: Secondary | ICD-10-CM

## 2024-01-06 NOTE — Progress Notes (Signed)
 Patient with a history of pancreatic cancer presented to the [facility] clinic today for port-a-catheter placement. She was not aware to be NPO and she drank a protein shake around 7 am.   Procedure will be rescheduled. Patient is aware to be NPO after midnight on the day of the procedure.   Alwyn Ren, AGACNP-BC 01/06/2024, 9:11 AM

## 2024-01-06 NOTE — Progress Notes (Signed)
 Late entry, spoke with patient's daughter Malena Catholic on 01/03/24 regarding her mother's port placement on 01/06/24. She stated she would relay the instructions to her mother and that she would be accompanying her to her procedure. Instructions given regarding NPO status, time of arrival 8:00am, what to expect pre, during, post procedure, I advised her to wear comfortable clothing, and we reviewed her allergies and medications. Time allowed to ask questions Malena Catholic verbalized understanding.

## 2024-01-06 NOTE — Progress Notes (Signed)
 Janet Williamson arrived for her Port placement at [phone]. I greeted Janet Williamson and her daughter Janet Williamson and led them to the nurse's area. I explained the consent forms, obtained her vital signs asked when was the last time she had anything to eat or drink at that time she stated she had an ensure this morning at 0700 with her medications. I asked if she just had sips as directed to [facility] on Friday and she stated no she finished the bottle. At that time I spoke with Janet Muir, NP and she stated we need to have her rescheduled, Janet Williamson agreed. Janet Muir, NP spoke with Janet Williamson and her daughter explaining the need to reschedule. I contacted the schedulers to make them aware as well.

## 2024-01-07 ENCOUNTER — Other Ambulatory Visit: Payer: Medicare HMO

## 2024-01-07 DIAGNOSIS — E7800 Pure hypercholesterolemia, unspecified: Secondary | ICD-10-CM | POA: Diagnosis not present

## 2024-01-07 DIAGNOSIS — E119 Type 2 diabetes mellitus without complications: Secondary | ICD-10-CM | POA: Diagnosis not present

## 2024-01-07 NOTE — Assessment & Plan Note (Signed)
 T3N0 by EUS, questionable peritoneal carcinomatosis -She had an episode of abdominal pain, nausea, and dizziness at church and came to ED 08/18/2023 -CT AP done 10/08/2023 showing a stable solid right lower lobe pulmonary nodule from 2019 and an ill-defined hypodense lesion in the pancreatic head measuring 4.4 x 3.9 x 3.3 cm obstructing the pancreatic and CBD appearing to encase and occlude the SMV and portal confluence with soft tissue stranding extending along the mesenteric root with nodular foci peritoneal soft tissue nodularity anterior to the mass; overall concerning for primary pancreatic malignancy with peritoneal carcinomatosis.    -EUS 10/16/23 by Dr. Dulce Sellar showing a T3 N0 pancreatic head mass invading the SMA and ERCP with stenting by Dr. Ewing Schlein, cytology confirming adenocarcinoma.  -Due to her age, the vascular invasion/abutment of the SMA, and possible peritoneal involvement, she is likely not a surgical candidate but Dr. Donell Beers has reviewed her case and agreed to see the pt for surgical discussion. She was seen on 11/26/2023 and felt to be a poor candidate for surgery  -PET scan showed no definite evidence of metastatic disease.  The questionable peritoneal involvement on CT is felt to be local extension -plan to start chemo gemcitabine alone, and may add Abraxane from second cycle if tolerates well. She started chemo on 11/13/2023, she did not tolerate gem/Abraxane, and chemo changed back to gemcitabine alone

## 2024-01-08 ENCOUNTER — Inpatient Hospital Stay: Attending: Nurse Practitioner

## 2024-01-08 ENCOUNTER — Inpatient Hospital Stay (HOSPITAL_BASED_OUTPATIENT_CLINIC_OR_DEPARTMENT_OTHER): Admitting: Hematology

## 2024-01-08 ENCOUNTER — Inpatient Hospital Stay

## 2024-01-08 ENCOUNTER — Encounter: Payer: Self-pay | Admitting: Endocrinology

## 2024-01-08 VITALS — BP 144/56 | HR 64 | Temp 97.3°F | Resp 17 | Wt 156.2 lb

## 2024-01-08 DIAGNOSIS — Z79899 Other long term (current) drug therapy: Secondary | ICD-10-CM | POA: Diagnosis not present

## 2024-01-08 DIAGNOSIS — E876 Hypokalemia: Secondary | ICD-10-CM | POA: Diagnosis not present

## 2024-01-08 DIAGNOSIS — C250 Malignant neoplasm of head of pancreas: Secondary | ICD-10-CM

## 2024-01-08 DIAGNOSIS — Z5111 Encounter for antineoplastic chemotherapy: Secondary | ICD-10-CM | POA: Insufficient documentation

## 2024-01-08 LAB — CMP (CANCER CENTER ONLY)
ALT: 7 U/L (ref 0–44)
AST: 11 U/L — ABNORMAL LOW (ref 15–41)
Albumin: 3.7 g/dL (ref 3.5–5.0)
Alkaline Phosphatase: 60 U/L (ref 38–126)
Anion gap: 5 (ref 5–15)
BUN: 9 mg/dL (ref 8–23)
CO2: 32 mmol/L (ref 22–32)
Calcium: 10.1 mg/dL (ref 8.9–10.3)
Chloride: 101 mmol/L (ref 98–111)
Creatinine: 0.57 mg/dL (ref 0.44–1.00)
GFR, Estimated: >60 mL/min (ref >60)
Glucose, Bld: 118 mg/dL — ABNORMAL HIGH (ref 70–99)
Potassium: 3.8 mmol/L (ref 3.5–5.1)
Sodium: 138 mmol/L (ref 135–145)
Total Bilirubin: 0.3 mg/dL (ref 0.0–1.2)
Total Protein: 7.4 g/dL (ref 6.5–8.1)

## 2024-01-08 LAB — CBC WITH DIFFERENTIAL (CANCER CENTER ONLY)
Abs Immature Granulocytes: 0.02 10*3/uL (ref 0.00–0.07)
Basophils Absolute: 0 10*3/uL (ref 0.0–0.1)
Basophils Relative: 1 %
Eosinophils Absolute: 0.2 10*3/uL (ref 0.0–0.5)
Eosinophils Relative: 2 %
HCT: 32.6 % — ABNORMAL LOW (ref 36.0–46.0)
Hemoglobin: 10 g/dL — ABNORMAL LOW (ref 12.0–15.0)
Immature Granulocytes: 0 %
Lymphocytes Relative: 34 %
Lymphs Abs: 2.7 10*3/uL (ref 0.7–4.0)
MCH: 23.6 pg — ABNORMAL LOW (ref 26.0–34.0)
MCHC: 30.7 g/dL (ref 30.0–36.0)
MCV: 77.1 fL — ABNORMAL LOW (ref 80.0–100.0)
Monocytes Absolute: 0.6 10*3/uL (ref 0.1–1.0)
Monocytes Relative: 7 %
Neutro Abs: 4.4 10*3/uL (ref 1.7–7.7)
Neutrophils Relative %: 56 %
Platelet Count: 272 10*3/uL (ref 150–400)
RBC: 4.23 MIL/uL (ref 3.87–5.11)
RDW: 18.2 % — ABNORMAL HIGH (ref 11.5–15.5)
WBC Count: 7.9 10*3/uL (ref 4.0–10.5)
nRBC: 0 % (ref 0.0–0.2)

## 2024-01-08 LAB — MICROALBUMIN / CREATININE URINE RATIO
Creatinine, Urine: 108 mg/dL (ref 20–275)
Microalb Creat Ratio: 5 mg/g{creat} (ref <30)
Microalb, Ur: 0.5 mg/dL

## 2024-01-08 LAB — LIPID PANEL
Cholesterol: 137 mg/dL (ref <200)
HDL: 42 mg/dL — ABNORMAL LOW (ref >=50)
LDL Cholesterol (Calc): 78 mg/dL
Non-HDL Cholesterol (Calc): 95 mg/dL (ref <130)
Total CHOL/HDL Ratio: 3.3 (calc) (ref <5.0)
Triglycerides: 91 mg/dL (ref <150)

## 2024-01-08 MED ORDER — PROCHLORPERAZINE MALEATE 10 MG PO TABS
10.0000 mg | ORAL_TABLET | Freq: Once | ORAL | Status: AC
  Administered 2024-01-08: 10 mg via ORAL
  Filled 2024-01-08: qty 1

## 2024-01-08 MED ORDER — SODIUM CHLORIDE 0.9 % IV SOLN
1000.0000 mg/m2 | Freq: Once | INTRAVENOUS | Status: AC
  Administered 2024-01-08: 1824 mg via INTRAVENOUS
  Filled 2024-01-08: qty 47.97

## 2024-01-08 MED ORDER — SODIUM CHLORIDE 0.9 % IV SOLN: INTRAVENOUS | Status: DC

## 2024-01-08 NOTE — Patient Instructions (Signed)
 [facility] - A DEPT OF [facility]  Discharge Instructions: Thank you for choosing [facility] to provide your oncology and hematology care.   If you have a lab appointment with the Cancer Center, please go directly to the Cancer Center and check in at the registration area.   Wear comfortable clothing and clothing appropriate for easy access to any Portacath or PICC line.   We strive to give you quality time with your provider. You may need to reschedule your appointment if you arrive late (15 or more minutes).  Arriving late affects you and other patients whose appointments are after yours.  Also, if you miss three or more appointments without notifying the office, you may be dismissed from the clinic at the provider's discretion.      For prescription refill requests, have your pharmacy contact our office and allow 72 hours for refills to be completed.    Today you received the following chemotherapy and/or immunotherapy agents: Gemcitabine.       To help prevent nausea and vomiting after your treatment, we encourage you to take your nausea medication as directed.  BELOW ARE SYMPTOMS THAT SHOULD BE REPORTED IMMEDIATELY: *FEVER GREATER THAN 100.4 F (38 C) OR HIGHER *CHILLS OR SWEATING *NAUSEA AND VOMITING THAT IS NOT CONTROLLED WITH YOUR NAUSEA MEDICATION *UNUSUAL SHORTNESS OF BREATH *UNUSUAL BRUISING OR BLEEDING *URINARY PROBLEMS (pain or burning when urinating, or frequent urination) *BOWEL PROBLEMS (unusual diarrhea, constipation, pain near the anus) TENDERNESS IN MOUTH AND THROAT WITH OR WITHOUT PRESENCE OF ULCERS (sore throat, sores in mouth, or a toothache) UNUSUAL RASH, SWELLING OR PAIN  UNUSUAL VAGINAL DISCHARGE OR ITCHING   Items with * indicate a potential emergency and should be followed up as soon as possible or go to the Emergency Department if any problems should occur.  Please show the CHEMOTHERAPY ALERT CARD or  IMMUNOTHERAPY ALERT CARD at check-in to the Emergency Department and triage nurse.  Should you have questions after your visit or need to cancel or reschedule your appointment, please contact [facility] - A DEPT OF [facility]  Dept: [phone]  and follow the prompts.  Office hours are 8:00 a.m. to 4:30 p.m. Monday - Friday. Please note that voicemails left after 4:00 p.m. may not be returned until the following business day.  We are closed weekends and major holidays. You have access to a nurse at all times for urgent questions. Please call the main number to the clinic Dept: [phone] and follow the prompts.   For any non-urgent questions, you may also contact your provider using MyChart. We now offer e-Visits for anyone 19 and older to request care online for non-urgent symptoms. For details visit [email or web address].   Also download the MyChart app! Go to the app store, search "MyChart", open the app, select [facility], and log in with your MyChart username and password.

## 2024-01-08 NOTE — Progress Notes (Signed)
 [facility]   [phone] [phone]   Clinic Follow up Note   Patient Care Team: Irven Coe, MD as PCP - General (Family Medicine) Jake Bathe, MD as PCP - Cardiology (Cardiology) Malachy Mood, MD as Consulting Physician (Hematology) [name], MD as Consulting Physician (Gastroenterology) Vida Rigger, MD as Consulting Physician (Gastroenterology)  Date of Service:  01/08/2024  CHIEF COMPLAINT: f/u of pancreatic cancer  CURRENT THERAPY:  Gemcitabine on day 1 and 8 every 21 days  Oncology History   Malignant neoplasm of head of pancreas (HCC) T3N0 by EUS, questionable peritoneal carcinomatosis -She had an episode of abdominal pain, nausea, and dizziness at church and came to ED 08/18/2023 -CT AP done 10/08/2023 showing a stable solid right lower lobe pulmonary nodule from 2019 and an ill-defined hypodense lesion in the pancreatic head measuring 4.4 x 3.9 x 3.3 cm obstructing the pancreatic and CBD appearing to encase and occlude the SMV and portal confluence with soft tissue stranding extending along the mesenteric root with nodular foci peritoneal soft tissue nodularity anterior to the mass; overall concerning for primary pancreatic malignancy with peritoneal carcinomatosis.    -EUS 10/16/23 by Dr. Dulce Sellar showing a T3 N0 pancreatic head mass invading the SMA and ERCP with stenting by Dr. Ewing Schlein, cytology confirming adenocarcinoma.  -Due to her age, the vascular invasion/abutment of the SMA, and possible peritoneal involvement, she is likely not a surgical candidate but Dr. Donell Beers has reviewed her case and agreed to see the pt for surgical discussion. She was seen on 11/26/2023 and felt to be a poor candidate for surgery  -PET scan showed no definite evidence of metastatic disease.  The questionable peritoneal involvement on CT is felt to be local extension -plan to start chemo gemcitabine alone, and may add Abraxane from second cycle if tolerates  well. She started chemo on 11/13/2023, she did not tolerate gem/Abraxane, and chemo changed back to gemcitabine alone     Assessment and Plan    Pancreatic cancer Undergoing chemotherapy for pancreatic cancer. Previously on a regimen of two drugs, which was difficult to tolerate. Currently on a single chemotherapy drug administered weekly for two weeks, followed by a week off. Lab results show hemoglobin at 10.0, with normal renal and hepatic functions. A CT scan is scheduled for late April or early May to assess cancer status. Discussed potential surgery and radiation therapy; she is currently unsure about surgery. Will review scan results with Dr. Ebbie Ridge to determine further treatment options. - Continue chemotherapy with one drug weekly for two weeks, then one week off - Order CT scan for the week of April 23 - Discuss potential surgery and radiation therapy - Review scan results with Dr. Donell Beers   Diarrhea Experiences diarrhea with urgency and abdominal pain, primarily in the morning and late morning. Stools are formed. Currently managing symptoms with Imodium. Advised against increasing the dose to avoid constipation. - Continue Imodium as needed for diarrhea  Low potassium Previously had hypokalemia and was prescribed potassium supplements. It is unclear if she is currently taking them. - Confirm if she is taking potassium supplements  Plan -Lab reviewed, adequate for treatment, will proceed cycle 3-day 1 gemcitabine today, she will return next week for day 8 treatment -next Follow-up appointment scheduled for April 30, including a CT scan and dietitian consultation. Port placement appointment also scheduled, previously delayed.         SUMMARY OF ONCOLOGIC HISTORY: Oncology History  Malignant neoplasm of head of pancreas (  HCC)  10/16/2023 Cancer Staging   Staging form: Exocrine Pancreas, AJCC 8th Edition - Clinical stage from 10/16/2023: Stage IIA (cT3, cN0, cM0) - Signed by Malachy Mood, MD on 11/20/2023 Total positive nodes: 0   10/24/2023 Initial Diagnosis   Malignant neoplasm of head of pancreas (HCC)   11/13/2023 -  Chemotherapy   Patient is on Treatment Plan : PANCREATIC Abraxane D1,8,15 + Gemcitabine D1,8,15 q28d     12/04/2023 Genetic Testing   Negative genetic testing on the CancerNext+RNA panel.  BRCA1  p.D695N (c.2083G>A)  VUS identified.  The report date is 12/03/2023.  The [facility] CancerNext+RNAinsight Panel includes sequencing, rearrangement analysis, and RNA analysis for the following 39 genes: APC, ATM, BAP1, BARD1, BMPR1A, BRCA1, BRCA2, BRIP1, CDH1, CDKN2A, CHEK2, FH, FLCN, MET, MLH1, MSH2, MSH6, MUTYH, NF1, NTHL1, PALB2, PMS2, PTEN, RAD51C, RAD51D, SMAD4, STK11, TP53, TSC1, TSC2, and VHL (sequencing and deletion/duplication); AXIN2, HOXB13, MBD4, MSH3, POLD1 and POLE (sequencing only); EPCAM and GREM1 (deletion/duplication only).       Discussed the use of AI scribe software for clinical note transcription with the patient, who gave verbal consent to proceed.  History of Present Illness   The patient, an 83 year old with pancreatic cancer, presents for a follow-up visit. She reports tolerating the last cycle of chemotherapy well, which was reduced to one drug due to difficulty tolerating the two-drug regimen. She denies any new problems since the last visit.  The patient also reports experiencing diarrhea, characterized by urgency and occasional incontinence. The bowel movements occur primarily in the morning and are formed, not loose. She is currently managing this with over-the-counter Imodium.  The patient is scheduled for a port placement procedure and has an upcoming appointment with a dietitian. She has requested an extra week break in her chemotherapy cycle due to scheduling.         All other systems were reviewed with the patient and are negative.  MEDICAL HISTORY:  Past Medical History:  Diagnosis Date   Acute lower GI bleeding 05/2019    Anemia    Cardiac mass    a. 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 Not seen on most recent TEE 2019.   Cardiomyopathy in other disease    Cataracts, bilateral    Diabetes mellitus    Type 2   Generalized osteoarthritis    GERD (gastroesophageal reflux disease)    HLD (hyperlipidemia)    Hypertension    Hypothyroidism    LBBB (left bundle branch block)    PAF (paroxysmal atrial fibrillation) (HCC)    a. s/p DCCV 06/2017, on Xarelto   Phlebitis    S/P TAVR (transcatheter aortic valve replacement) 04/15/2018   Edwards Sapien 3 THV (size 23 mm, model # 9600TFX, serial # [id number]) via the TF approach   Severe aortic stenosis    a. s/p TAVR 04/2018.   Stroke [facility]) 2008    SURGICAL HISTORY: Past Surgical History:  Procedure Laterality Date   ABDOMINAL HYSTERECTOMY     BILIARY BRUSHING  10/16/2023   Procedure: BILIARY BRUSHING;  Surgeon: [name], MD;  Location: [facility] ENDOSCOPY;  Service: Gastroenterology;;   BILIARY STENT PLACEMENT N/A 10/16/2023   Procedure: BILIARY STENT PLACEMENT;  Surgeon: [name], MD;  [facility];  Service: Gastroenterology;  Laterality: N/A;   BIOPSY  05/19/2019   Procedure: BIOPSY;  Surgeon: Bernette Redbird, MD;  Location: [facility] ENDOSCOPY;  Service: Endoscopy;;   BREAST BIOPSY Left    years ago at "a doctor's office"   COLONOSCOPY  COLONOSCOPY WITH PROPOFOL N/A 10/02/2018   Procedure: COLONOSCOPY WITH PROPOFOL;  Surgeon: [name], MD;  Location: [facility] ENDOSCOPY;  Service: Endoscopy;  Laterality: N/A;   ENTEROSCOPY N/A 05/19/2019   Procedure: ENTEROSCOPY;  Surgeon: Bernette Redbird, MD;  Location: [facility] ENDOSCOPY;  Service: Endoscopy;  Laterality: N/A;   ERCP N/A 10/16/2023   Procedure: ENDOSCOPIC RETROGRADE CHOLANGIOPANCREATOGRAPHY (ERCP);  Surgeon: [name], MD;  Location: [facility] ENDOSCOPY;  Service: Gastroenterology;  Laterality: N/A;   ESOPHAGOGASTRODUODENOSCOPY (EGD) WITH PROPOFOL N/A 10/02/2018   Procedure: ESOPHAGOGASTRODUODENOSCOPY  (EGD) WITH PROPOFOL;  Surgeon: [name], MD;  Location: [facility] ENDOSCOPY;  Service: Endoscopy;  Laterality: N/A;   ESOPHAGOGASTRODUODENOSCOPY (EGD) WITH PROPOFOL N/A 04/23/2019   Procedure: ESOPHAGOGASTRODUODENOSCOPY (EGD) WITH PROPOFOL;  Surgeon: Graylin Shiver, MD;  Location: [facility] ENDOSCOPY;  Service: Endoscopy;  Laterality: N/A;   ESOPHAGOGASTRODUODENOSCOPY (EGD) WITH PROPOFOL N/A 10/16/2023   Procedure: ESOPHAGOGASTRODUODENOSCOPY (EGD) WITH PROPOFOL;  Surgeon: [name], MD;  Location: [facility];  Service: Gastroenterology;  Laterality: N/A;   EUS N/A 10/16/2023   Procedure: FULL UPPER ENDOSCOPIC ULTRASOUND (EUS) RADIAL;  Surgeon: [name], MD;  Location: [facility];  Service: Gastroenterology;  Laterality: N/A;   EYE SURGERY Bilateral    cataract removal   FINE NEEDLE ASPIRATION N/A 10/16/2023   Procedure: FINE NEEDLE ASPIRATION (FNA) LINEAR;  Surgeon: [name], MD;  Location: [facility];  Service: Gastroenterology;  Laterality: N/A;   GIVENS CAPSULE STUDY N/A 10/02/2018   Procedure: GIVENS CAPSULE STUDY;  Surgeon: [name], MD;  Location: [facility] ENDOSCOPY;  Service: Endoscopy;  Laterality: N/A;   RIGHT/LEFT HEART CATH AND CORONARY ANGIOGRAPHY N/A 03/06/2018   Procedure: RIGHT/LEFT HEART CATH AND CORONARY ANGIOGRAPHY;  Surgeon: Lyn Records, MD;  Location: [facility] INVASIVE CV LAB;  Service: Cardiovascular;  Laterality: N/A;   SMALL BOWEL ENTEROSCOPY  05/19/2019   SPHINCTEROTOMY  10/16/2023   Procedure: SPHINCTEROTOMY;  Surgeon: [name], MD;  Location[facility];  Service: Gastroenterology;;   TEE WITHOUT CARDIOVERSION N/A 04/01/2018   Procedure: TRANSESOPHAGEAL ECHOCARDIOGRAM (TEE);  Surgeon: Jake Bathe, MD;  Location: [facility] ENDOSCOPY;  Service: Cardiovascular;  Laterality: N/A;   TEE WITHOUT CARDIOVERSION N/A 04/15/2018   Procedure: TRANSESOPHAGEAL ECHOCARDIOGRAM (TEE);  Surgeon: Tonny Bollman, MD;  Location: [facility] OR;  Service: Open Heart Surgery;  Laterality:  N/A;   TOTAL KNEE ARTHROPLASTY Right 04/14/2013   Dr August Saucer   TOTAL KNEE ARTHROPLASTY Right 04/14/2013   Procedure: TOTAL KNEE ARTHROPLASTY;  Surgeon: Cammy Copa, MD;  Location[facility];  Service: Orthopedics;  Laterality: Right;   TRANSCATHETER AORTIC VALVE REPLACEMENT, TRANSFEMORAL  04/15/2018   TRANSCATHETER AORTIC VALVE REPLACEMENT, TRANSFEMORAL Bilateral 04/15/2018   Procedure: TRANSCATHETER AORTIC VALVE REPLACEMENT, TRANSFEMORAL;  Surgeon: Tonny Bollman, MD;  Location: [facility] OR;  Service: Open Heart Surgery;  Laterality: Bilateral;    I have reviewed the social history and family history with the patient and they are unchanged from previous note.  ALLERGIES:  is allergic to ace inhibitors, keflex [cephalexin], and rifadin [rifampin].  MEDICATIONS:  Current Outpatient Medications  Medication Sig Dispense Refill   acetaminophen (TYLENOL) 325 MG tablet Take 325-650 mg by mouth every 8 (eight) hours as needed (for headaches).     amLODipine (NORVASC) 5 MG tablet Take 5 mg by mouth daily.     Blood Glucose Monitoring Suppl (ACCU-CHEK GUIDE ME) w/Device KIT by Does not apply route.     Blood Glucose Monitoring Suppl (ONE TOUCH ULTRA 2) w/Device KIT Use to check blood sugars three times daily DX: E11.9 1 kit 0  Cholecalciferol (VITAMIN D3) 5000 units CAPS Take 5,000 Units by mouth daily.     hydrochlorothiazide (HYDRODIURIL) 25 MG tablet Take 25 mg by mouth daily.      hydrOXYzine (ATARAX/VISTARIL) 25 MG tablet Take 25 mg by mouth 3 (three) times daily as needed.     Lancets (ONETOUCH DELICA PLUS LANCET33G) MISC TEST BLOOD SUGAR THREE TIMES DAILY 300 each 10   loperamide (IMODIUM) 2 MG capsule Take 1 capsule (2 mg total) by mouth as needed for diarrhea or loose stools. 30 capsule 1   metFORMIN (GLUCOPHAGE-XR) 750 MG 24 hr tablet TAKE 2 TABLETS ONE TIME DAILY WITH BREAKFAST 180 tablet 2   metoprolol succinate (TOPROL-XL) 50 MG 24 hr tablet Take 50 mg by mouth daily.      ondansetron  (ZOFRAN) 8 MG tablet Take 1 tablet (8 mg total) by mouth every 8 (eight) hours as needed for nausea or vomiting. 20 tablet 3   ONETOUCH ULTRA TEST test strip TEST BLOOD SUGAR THREE TIMES DAILY 300 strip 3   Polyethyl Glycol-Propyl Glycol (SYSTANE OP) Place 1 drop into both eyes 2 (two) times daily.      potassium chloride SA (KLOR-CON M) 20 MEQ tablet Take 1 tablet (20 mEq total) by mouth 2 (two) times daily. 60 tablet 0   prochlorperazine (COMPAZINE) 10 MG tablet Take 1 tablet (10 mg total) by mouth every 6 (six) hours as needed for nausea or vomiting. 20 tablet 3   simvastatin (ZOCOR) 20 MG tablet Take 20 mg by mouth every evening.     No current facility-administered medications for this visit.   Facility-Administered Medications Ordered in Other Visits  Medication Dose Route Frequency Provider Last Rate Last Admin   0.9 %  sodium chloride infusion   Intravenous Continuous Malachy Mood, MD   Stopped at 12/11/23 1305   0.9 %  sodium chloride infusion   Intravenous Continuous Malachy Mood, MD   Stopped at 12/11/23 1304   0.9 %  sodium chloride infusion   Intravenous Continuous Malachy Mood, MD   Stopped at 01/08/24 1049   0.9 %  sodium chloride infusion   Intravenous Continuous Malachy Mood, MD   Stopped at 01/08/24 1157    PHYSICAL EXAMINATION: ECOG PERFORMANCE STATUS: 1 - Symptomatic but completely ambulatory  Vitals:   01/08/24 0907  BP: (!) 144/56  Pulse: 64  Resp: 17  Temp: (!) 97.3 F (36.3 C)  SpO2: 99%   Wt Readings from Last 3 Encounters:  01/08/24 156 lb 3.2 oz (70.9 kg)  12/25/23 155 lb 14.4 oz (70.7 kg)  12/11/23 158 lb 9.6 oz (71.9 kg)     GENERAL:alert, no distress and comfortable SKIN: skin color, texture, turgor are normal, no rashes or significant lesions EYES: normal, Conjunctiva are pink and non-injected, sclera clear NECK: supple, thyroid normal size, non-tender, without nodularity LYMPH:  no palpable lymphadenopathy in the cervical, axillary  LUNGS: clear to  auscultation and percussion with normal breathing effort HEART: regular rate & rhythm and no murmurs and no lower extremity edema ABDOMEN:abdomen soft, non-tender and normal bowel sounds Musculoskeletal:no cyanosis of digits and no clubbing  NEURO: alert & oriented x 3 with fluent speech, no focal motor/sensory deficits    LABORATORY DATA:  I have reviewed the data as listed    Latest Ref Rng & Units 01/08/2024    8:30 AM 12/25/2023   11:30 AM 12/11/2023    9:27 AM  CBC  WBC 4.0 - 10.5 K/uL 7.9  7.4  8.6  Hemoglobin 12.0 - 15.0 g/dL 16.1  09.6  04.5   Hematocrit 36.0 - 46.0 % 32.6  33.9  33.4   Platelets 150 - 400 K/uL 272  290  398         Latest Ref Rng & Units 01/08/2024    8:30 AM 12/25/2023   11:30 AM 12/11/2023    9:27 AM  CMP  Glucose 70 - 99 mg/dL 409  811  914   BUN 8 - 23 mg/dL 9  12  9    Creatinine 0.44 - 1.00 mg/dL 7.82  9.56  2.13   Sodium 135 - 145 mmol/L 138  138  136   Potassium 3.5 - 5.1 mmol/L 3.8  4.0  3.5   Chloride 98 - 111 mmol/L 101  100  101   CO2 22 - 32 mmol/L 32  33  29   Calcium 8.9 - 10.3 mg/dL 08.6  57.8  9.4   Total Protein 6.5 - 8.1 g/dL 7.4  7.4  7.4   Total Bilirubin 0.0 - 1.2 mg/dL 0.3  0.3  0.3   Alkaline Phos 38 - 126 U/L 60  63  66   AST 15 - 41 U/L 11  13  11    ALT 0 - 44 U/L 7  10  6        RADIOGRAPHIC STUDIES: I have personally reviewed the radiological images as listed and agreed with the findings in the report. No results found.    Orders Placed This Encounter  Procedures   CT PANCREAS ABDOMEN W WO CONTRAST    Standing Status:   Future    Expected Date:   01/29/2024    Expiration Date:   01/07/2025    If indicated for the ordered procedure, I authorize the administration of contrast media per Radiology protocol:   Yes    Does the patient have a contrast media/X-ray dye allergy?:   No    Preferred imaging location?:   [facility]   All questions were answered. The patient knows to call the clinic with any problems,  questions or concerns. No barriers to learning was detected. The total time spent in the appointment was 25 minutes.     Malachy Mood, MD 01/08/2024

## 2024-01-09 NOTE — Progress Notes (Signed)
 Reminder call to Janet Williamson regarding Port placement on 01/10/24. Instructions given to her daughter Janet Williamson, she will be accompanying her mother. Instructions given on NPO status I reiterated sips of water with her blood pressure medications in the morning, what to expect pre, during, and post procedure, we reviewed allergies and medications, advised Janet Williamson to wear comfortable clothing, and arrival time of 10am. Time allowed to ask questions, Janet Williamson verbalized understanding.

## 2024-01-09 NOTE — Discharge Instructions (Signed)
 Implanted Port Insertion After Care   What can I expect after the procedure?   After the procedure, it is common to have:   Discomfort at the port insertion site.   Bruising on the skin over the port. This should improve over 3-4 days.    Port care   After your port is placed, you will get a manufacturer's information card. The card has information about your port. Keep this card with you at all times.   Take care of the port as told by your health care provider. Ask your health care provider if you or a family member can get training for taking care of the port at home.   Make sure to remember what type of port you have.       Incision care   Follow instructions from your health care provider about how to take care of your port insertion site. Make sure you:   Wash your hands with soap and water for at least 20 seconds before and after you change your bandage (dressing). If soap and water are not available, use hand sanitizer.        Leave your initial bandage on for a full 24 hours.     After 24 hours you may remove the dressing and shower.  Do not scrub directly on the incision site but rather above it and let the soapy water run over the incision.  Pat dry after.    You may then opt to redress your incision for your comfort but you may just leave it open to air.  The Dermabond (surgical super glue) will protect your incision and keep it clean and dry.    Leave the layer of skin glue in place. If the glue edges start to loosen and curl up, you may trim the loose edges but do not pull or pick at it.   Do NOT apply neosporin or other antibacterial ointment to the surgical glue.  It will dissolve the glue and expose your new incision to possible infection.     Do NOT apply EMLA numbing cream to the surgical glue.  It will dissolve the glue and expose your new incision to possible infection.  You may have to wait to use the EMLA cream until your incision has healed.    Check  your port insertion site every day for signs of infection. Check for:   Redness, swelling, or pain.   Fluid or blood.   Warmth.   Pus or a bad smell.      Activity   Return to your normal activities as told by your health care provider. Ask your health care provider what activities are safe for you.   You may have to avoid lifting. Ask your health care provider how much you can safely lift.   General instructions   Take over-the-counter and prescription medicines only as told by your health care provider.   Do not take baths, swim, or use a hot tub until your incision has healed completely (usually 2 weeks).    If you were given a sedative during the procedure, it can affect you for several hours. Do not drive, operate machinery or sign important documents for 24 hours after your procedure.   Keep all follow-up visits. This is important.   Please contact our office at [phone] or you may call [phone] and ask to speak to [place], [place] for the following symptoms:   You have a fever or chills.   You  have redness, swelling, or pain around your port insertion site.   You have fluid or blood coming from your port insertion site.   Your port insertion site feels warm to the touch.   You have pus or a bad smell coming from the port insertion site.   Get help right away if:   You have chest pain or shortness of breath.   You have bleeding from your port that you cannot control.   Do not wait to see if the symptoms will go away.   Do not drive yourself to the hospital.      These symptoms may be an emergency.    Get help right away. Call 911.     Summary   Take care of the port as told by your health care provider. Keep the manufacturer's information card with you at all times.   Keep your dressing on for 24 hours!  After that you can shower and redress the site only as needed.    No neosporin, antibiotic ointment or EMLA numbing cream on the glue over  your incision   Do not submerge your incision under water in a bath, pool or hot tube until fully healed   Contact a health care provider if you have a fever or chills or if you have redness, swelling, or pain around your port insertion site.   Keep all follow-up visits.   If you need to speak to someone after hours (5:00PM) please contact the on-call IR MD at [phone]. Tell them you are a patient of Dr. Loreta Ave; you had a Port placed today and any issues you are experiencing.   Thank you for visiting [facility] today!

## 2024-01-10 ENCOUNTER — Ambulatory Visit
Admission: RE | Admit: 2024-01-10 | Discharge: 2024-01-10 | Disposition: A | Discharge status: Home / Self Care | Source: Ambulatory Visit | Attending: Hematology | Admitting: Hematology

## 2024-01-10 DIAGNOSIS — C259 Malignant neoplasm of pancreas, unspecified: Secondary | ICD-10-CM | POA: Diagnosis not present

## 2024-01-10 DIAGNOSIS — Z452 Encounter for adjustment and management of vascular access device: Secondary | ICD-10-CM | POA: Diagnosis not present

## 2024-01-10 HISTORY — PX: IR IMAGING GUIDED PORT INSERTION: IMG5740

## 2024-01-10 MED ORDER — HEPARIN SOD (PORK) LOCK FLUSH 100 UNIT/ML IV SOLN
500.0000 [IU] | Freq: Once | INTRAVENOUS | Status: AC
  Administered 2024-01-10: 500 [IU]

## 2024-01-10 MED ORDER — LIDOCAINE-EPINEPHRINE 1 %-1:100000 IJ SOLN
20.0000 mL | Freq: Once | INTRAMUSCULAR | Status: AC
  Administered 2024-01-10: 20 mL via INTRADERMAL

## 2024-01-10 MED ORDER — FENTANYL CITRATE (PF) 100 MCG/2ML IJ SOLN
INTRAMUSCULAR | Status: AC | PRN
  Administered 2024-01-10: 50 ug via INTRAVENOUS
  Administered 2024-01-10 (×2): 25 ug via INTRAVENOUS

## 2024-01-10 MED ORDER — MIDAZOLAM HCL 2 MG/2ML IJ SOLN
INTRAMUSCULAR | Status: AC | PRN
  Administered 2024-01-10: 1 mg via INTRAVENOUS
  Administered 2024-01-10 (×2): .5 mg via INTRAVENOUS

## 2024-01-10 NOTE — H&P (Signed)
 Chief Complaint: Patient was seen in consultation today for pancreatic cancer  Referring Physician(s): Feng,Yan  Supervising Physician: Malachy Moan  Patient Status: DRI Janet Williamson - outpatient   History of Present Illness: Janet Williamson is an 83 y.o. female with a medical history significant for lower GI bleed, DM2, paroxysmal atrial fibrillation, severe aortic stenosis s/p TAVR, stroke and recently diagnosed pancreatic cancer. She developed abdominal pain, nausea and dizziness last November and CT imaging showed an obstructing mass in the pancreatic head. She underwent EUS/ERCP 10/16/23 with cytology confirming adenocarcinoma.   She was started on chemotherapy 11/13/23 and her oncology team has recommended port-a-catheter placement for durable venous access.   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 Not seen on most recent TEE 2019.   Cardiomyopathy in other disease    Cataracts, bilateral    Diabetes mellitus    Type 2   Generalized osteoarthritis    GERD (gastroesophageal reflux disease)    HLD (hyperlipidemia)    Hypertension    Hypothyroidism    LBBB (left bundle branch block)    PAF (paroxysmal atrial fibrillation) (HCC)    a. s/p DCCV 06/2017, on Xarelto   Phlebitis    S/P TAVR (transcatheter aortic valve replacement) 04/15/2018   Edwards Sapien 3 THV (size 23 mm, model # 9600TFX, serial # [id number]) via the TF approach   Severe aortic stenosis    a. s/p TAVR 04/2018.   Stroke [facility]) 2008    Past Surgical History:  Procedure Laterality Date   ABDOMINAL HYSTERECTOMY     BILIARY BRUSHING  10/16/2023   Procedure: BILIARY BRUSHING;  Surgeon: [name], MD;  Location: [facility] ENDOSCOPY;  Service: Gastroenterology;;   BILIARY STENT PLACEMENT N/A 10/16/2023   Procedure: BILIARY STENT PLACEMENT;  Surgeon: [name], MD;  Location: [facility] ENDOSCOPY;  Service:  Gastroenterology;  Laterality: N/A;   BIOPSY  05/19/2019   Procedure: BIOPSY;  Surgeon: Bernette Redbird, MD;  Location: [facility] ENDOSCOPY;  Service: Endoscopy;;   BREAST BIOPSY Left    years ago at "a doctor's office"   COLONOSCOPY     COLONOSCOPY WITH PROPOFOL N/A 10/02/2018   Procedure: COLONOSCOPY WITH PROPOFOL;  Surgeon: [name], MD;  Location: [facility] ENDOSCOPY;  Service: Endoscopy;  Laterality: N/A;   ENTEROSCOPY N/A 05/19/2019   Procedure: ENTEROSCOPY;  Surgeon: Bernette Redbird, MD;  Location: [facility] ENDOSCOPY;  Service: Endoscopy;  Laterality: N/A;   ERCP N/A 10/16/2023   Procedure: ENDOSCOPIC RETROGRADE CHOLANGIOPANCREATOGRAPHY (ERCP);  Surgeon: [name], MD;  Location: [facility] ENDOSCOPY;  Service: Gastroenterology;  Laterality: N/A;   ESOPHAGOGASTRODUODENOSCOPY (EGD) WITH PROPOFOL N/A 10/02/2018   Procedure: ESOPHAGOGASTRODUODENOSCOPY (EGD) WITH PROPOFOL;  Surgeon: [name], MD;  Location: [facility] ENDOSCOPY;  Service: Endoscopy;  Laterality: N/A;   ESOPHAGOGASTRODUODENOSCOPY (EGD) WITH PROPOFOL N/A 04/23/2019   Procedure: ESOPHAGOGASTRODUODENOSCOPY (EGD) WITH PROPOFOL;  Surgeon: Graylin Shiver, MD;  Location: [facility] ENDOSCOPY;  Service: Endoscopy;  Laterality: N/A;   ESOPHAGOGASTRODUODENOSCOPY (EGD) WITH PROPOFOL N/A 10/16/2023   Procedure: ESOPHAGOGASTRODUODENOSCOPY (EGD) WITH PROPOFOL;  Surgeon: [name], MD;  Location: [facility] ENDOSCOPY;  Service: Gastroenterology;  Laterality: N/A;   EUS N/A 10/16/2023   Procedure: FULL UPPER ENDOSCOPIC ULTRASOUND (EUS) RADIAL;  Surgeon: [name], MD;  Location: [facility] ENDOSCOPY;  Service: Gastroenterology;  Laterality: N/A;   EYE SURGERY Bilateral    cataract removal   FINE NEEDLE ASPIRATION N/A 10/16/2023   Procedure:  FINE NEEDLE ASPIRATION (FNA) LINEAR;  Surgeon: [name], MD;  Location: [facility] ENDOSCOPY;  Service: Gastroenterology;  Laterality: N/A;   GIVENS CAPSULE STUDY N/A 10/02/2018   Procedure: GIVENS CAPSULE STUDY;  Surgeon: [name],  MD;  Location: [facility] ENDOSCOPY;  Service: Endoscopy;  Laterality: N/A;   RIGHT/LEFT HEART CATH AND CORONARY ANGIOGRAPHY N/A 03/06/2018   Procedure: RIGHT/LEFT HEART CATH AND CORONARY ANGIOGRAPHY;  Surgeon: Lyn Records, MD;  Location: [facility] INVASIVE CV LAB;  Service: Cardiovascular;  Laterality: N/A;   SMALL BOWEL ENTEROSCOPY  05/19/2019   SPHINCTEROTOMY  10/16/2023   Procedure: SPHINCTEROTOMY;  Surgeon: [name], MD;  Location: [facility] ENDOSCOPY;  Service: Gastroenterology;;   TEE WITHOUT CARDIOVERSION N/A 04/01/2018   Procedure: TRANSESOPHAGEAL ECHOCARDIOGRAM (TEE);  Surgeon: Jake Bathe, MD;  Location: [facility] ENDOSCOPY;  Service: Cardiovascular;  Laterality: N/A;   TEE WITHOUT CARDIOVERSION N/A 04/15/2018   Procedure: TRANSESOPHAGEAL ECHOCARDIOGRAM (TEE);  Surgeon: Tonny Bollman, MD;  Location: [facility] OR;  Service: Open Heart Surgery;  Laterality: N/A;   TOTAL KNEE ARTHROPLASTY Right 04/14/2013   Dr August Saucer   TOTAL KNEE ARTHROPLASTY Right 04/14/2013   Procedure: TOTAL KNEE ARTHROPLASTY;  Surgeon: Cammy Copa, MD;  Location: [facility] OR;  Service: Orthopedics;  Laterality: Right;   TRANSCATHETER AORTIC VALVE REPLACEMENT, TRANSFEMORAL  04/15/2018   TRANSCATHETER AORTIC VALVE REPLACEMENT, TRANSFEMORAL Bilateral 04/15/2018   Procedure: TRANSCATHETER AORTIC VALVE REPLACEMENT, TRANSFEMORAL;  Surgeon: Tonny Bollman, MD;  Location: [facility] OR;  Service: Open Heart Surgery;  Laterality: Bilateral;    Allergies: Ace inhibitors, Keflex [cephalexin], and Rifadin [rifampin]  Medications: Prior to Admission medications   Medication Sig Start Date End Date Taking? Authorizing Provider  acetaminophen (TYLENOL) 325 MG tablet Take 325-650 mg by mouth every 8 (eight) hours as needed (for headaches).    [provider]  amLODipine (NORVASC) 5 MG tablet Take 5 mg by mouth daily.    [provider]  Blood Glucose Monitoring Suppl (ACCU-CHEK GUIDE ME) w/Device KIT by Does not apply route.    [provider]  Blood Glucose Monitoring Suppl (ONE TOUCH ULTRA 2) w/Device KIT Use to check blood sugars three times daily DX: E11.9 08/23/20   Reather Littler, MD  Cholecalciferol (VITAMIN D3) 5000 units CAPS Take 5,000 Units by mouth daily.    [provider]  hydrochlorothiazide (HYDRODIURIL) 25 MG tablet Take 25 mg by mouth daily.     [provider]  hydrOXYzine (ATARAX/VISTARIL) 25 MG tablet Take 25 mg by mouth 3 (three) times daily as needed. 08/03/19   [provider]  Lancets (ONETOUCH DELICA PLUS LANCET33G) MISC TEST BLOOD SUGAR THREE TIMES DAILY 08/16/22   Reather Littler, MD  loperamide (IMODIUM) 2 MG capsule Take 1 capsule (2 mg total) by mouth as needed for diarrhea or loose stools. 11/12/23   Pollyann Samples, NP  metFORMIN (GLUCOPHAGE-XR) 750 MG 24 hr tablet TAKE 2 TABLETS ONE TIME DAILY WITH BREAKFAST 11/27/23   Thapa, [place], MD  metoprolol succinate (TOPROL-XL) 50 MG 24 hr tablet Take 50 mg by mouth daily.     [provider]  ondansetron (ZOFRAN) 8 MG tablet Take 1 tablet (8 mg total) by mouth every 8 (eight) hours as needed for nausea or vomiting. 11/12/23   Pollyann Samples, NP  ONETOUCH ULTRA TEST test strip TEST BLOOD SUGAR THREE TIMES DAILY 04/12/23   Reather Littler, MD  Polyethyl Glycol-Propyl Glycol (SYSTANE OP) Place 1 drop into both eyes 2 (two) times daily.     [provider]  potassium chloride SA (KLOR-CON M) 20 MEQ tablet Take 1 tablet (20 mEq total) by mouth 2 (two) times daily. 11/12/23   Pollyann Samples, NP  prochlorperazine (COMPAZINE) 10 MG tablet Take 1 tablet (10 mg total) by mouth every 6 (six) hours as needed for nausea or vomiting. 11/12/23   Pollyann Samples, NP  simvastatin (ZOCOR) 20 MG tablet Take 20 mg by mouth every evening.    [provider]     Family History  Problem Relation Age of Onset   Stroke Mother    Hypertension Mother    Hypertension Father    Heart attack Neg Hx     Social History   Socioeconomic  History   Marital status: Widowed    Spouse name: Not on file   Number of children: Not on file   Years of education: Not on file   Highest education level: Not on file  Occupational History   Not on file  Tobacco Use   Smoking status: Never   Smokeless tobacco: Never  Vaping Use   Vaping status: Never Used  Substance and Sexual Activity   Alcohol use: No   Drug use: No   Sexual activity: Not Currently  Other Topics Concern   Not on file  Social History Narrative   Not on file   Social Drivers of Health   Financial Resource Strain: Not on file  Food Insecurity: Not on file  Transportation Needs: Not on file  Physical Activity: Not on file  Stress: Not on file  Social Connections: Not on file    Review of Systems: A 12 point ROS discussed and pertinent positives are indicated in the HPI above.  All other systems are negative.  Review of Systems  All other systems reviewed and are negative.   Vital Signs: BP (!) 161/61 (BP Location: Left Arm, Patient Position: Sitting, Cuff Size: Normal)   Pulse 66   Temp 98.9 F (37.2 C) (Oral)   Resp 17   SpO2 97%   Physical Exam Constitutional:      General: She is not in acute distress.    Appearance: She is not ill-appearing.  HENT:     Mouth/Throat:     Mouth: Mucous membranes are moist.     Pharynx: Oropharynx is clear.  Cardiovascular:     Pulses: Normal pulses.     Heart sounds: Murmur heard.  Pulmonary:     Effort: Pulmonary effort is normal.  Abdominal:     Palpations: Abdomen is soft.     Tenderness: There is no abdominal tenderness.  Skin:    General: Skin is warm and dry.  Neurological:     Mental Status: She is alert and oriented to person, place, and time.  Psychiatric:        Mood and Affect: Mood normal.        Behavior: Behavior normal.        Thought Content: Thought content normal.        Judgment: Judgment normal.     Imaging: No results found.  Labs:  CBC: Recent Labs     11/20/23 1240 12/11/23 0927 12/25/23 1130 01/08/24 0830  WBC 6.6 8.6 7.4 7.9  HGB 10.3* 10.4* 10.5* 10.0*  HCT 33.1* 33.4* 33.9* 32.6*  PLT 229 398 290 272    COAGS: No results for input(s): "INR", "APTT" in the last 8760 hours.  BMP: Recent Labs    11/20/23 1240 12/11/23 0927 12/25/23 1130 01/08/24 0830  NA 139  136 138 138  K 3.4* 3.5 4.0 3.8  CL 99 101 100 101  CO2 35* 29 33* 32  GLUCOSE 183* 210* 274* 118*  BUN 10 9 12 9   CALCIUM 10.0 9.4 10.1 10.1  CREATININE 0.58 0.52 0.61 0.57  GFRNONAA >60 >60 >60 >60    LIVER FUNCTION TESTS: Recent Labs    11/20/23 1240 12/11/23 0927 12/25/23 1130 01/08/24 0830  BILITOT 0.3 0.3 0.3 0.3  AST 13* 11* 13* 11*  ALT 9 6 10 7   ALKPHOS 78 66 63 60  PROT 7.5 7.4 7.4 7.4  ALBUMIN 3.5 3.6 3.7 3.7    TUMOR MARKERS: No results for input(s): "AFPTM", "CEA", "CA199", "CHROMGRNA" in the last 8760 hours.  Assessment and Plan:  Pancreatic cancer currently receiving chemotherapy; durable venous access required: Janet Williamson, 83 year old female, presents today for an image-guided port-a-catheter placement.   Risks and benefits of image-guided port-a-catheter placement were discussed with the patient including, but not limited to bleeding, infection, pneumothorax, or fibrin sheath development and need for additional procedures.  All of the patient's questions were answered, patient is agreeable to proceed. She has been NPO. She is a full code.   Consent signed and in chart.  Thank you for this interesting consult.  I greatly enjoyed meeting Karita P Stofko and look forward to participating in their care.  A copy of this report was sent to the requesting provider on this date.  Electronically Signed: Alwyn Ren, AGACNP-BC 01/10/2024, 10:18 AM   I spent a total of  30 Minutes   in face to face in clinical consultation, greater than 50% of which was counseling/coordinating care for pancreatic cancer.

## 2024-01-10 NOTE — Progress Notes (Signed)
Pt back in nursing recovery area. Pt still drowsy from procedure but will wake up when spoken to. Pt follows commands, talks in complete sentences and has no complaints at this time. Pt will remain in nursing station until discharge.  ?

## 2024-01-13 ENCOUNTER — Ambulatory Visit (INDEPENDENT_AMBULATORY_CARE_PROVIDER_SITE_OTHER): Payer: Medicare HMO | Admitting: Endocrinology

## 2024-01-13 ENCOUNTER — Encounter: Payer: Self-pay | Admitting: Endocrinology

## 2024-01-13 VITALS — BP 152/70 | HR 77 | Resp 20 | Ht 63.0 in | Wt 154.8 lb

## 2024-01-13 DIAGNOSIS — E1169 Type 2 diabetes mellitus with other specified complication: Secondary | ICD-10-CM | POA: Diagnosis not present

## 2024-01-13 DIAGNOSIS — Z7984 Long term (current) use of oral hypoglycemic drugs: Secondary | ICD-10-CM | POA: Diagnosis not present

## 2024-01-13 DIAGNOSIS — E119 Type 2 diabetes mellitus without complications: Secondary | ICD-10-CM

## 2024-01-13 LAB — POCT GLYCOSYLATED HEMOGLOBIN (HGB A1C): Hemoglobin A1C: 7.1 % — AB (ref 4.0–5.6)

## 2024-01-13 NOTE — Progress Notes (Signed)
 Outpatient Endocrinology Note [place] Korynn Kenedy, MD  01/13/24  Patient's Name: Janet Williamson    DOB: 08/06/1941    MRN: [id number]                                                    REASON OF VISIT: Follow up for type 2 diabetes mellitus  PCP: Irven Coe, MD  HISTORY OF PRESENT ILLNESS:   Janet Williamson is a 83 y.o. old female with past medical history listed below, is here for follow up for type 2 diabetes mellitus /multinodular goiter.   Pertinent Diabetes History: Patient was diagnosed with type 2 diabetes mellitus in 2013.  She has relatively well-controlled type 2 diabetes mellitus controlled on metformin extended release.  Chronic Diabetes Complications : Retinopathy: no. Last ophthalmology exam was done annually reportedly. Nephropathy: no, She had swelling of the lips with ramipril and has not been prescribed an ARB /ACE drug  Peripheral neuropathy: no Coronary artery disease: no Stroke: stroke  Relevant comorbidities and cardiovascular risk factors: Obesity: no Body mass index is 27.42 kg/m.  Hypertension: yes Hyperlipidemia. Yes, on a statin.  Current / Home Diabetic regimen includes: Metformin extended release 750 mg 2 tablets daily.  Prior diabetic medications:  Glycemic data:    Not able to download the glucometer in the clinic today.  Blood sugar reviewed directly from the meter.  She has been checking mostly in the morning, in the afternoon and around suppertime, some of the blood sugar as follows : 127, 129, 157, 117, 154, 133, 159, 135, 158, 174,  171, 125  Hypoglycemia: Patient has no hypoglycemic episodes. Patient has hypoglycemia awareness.  Factors modifying glucose control: 1.  Diabetic diet assessment: 3 meals a day.  Sometimes ice cream.  2.  Staying active or exercising: Not able to exercise due to musculoskeletal pain.  3.  Medication compliance: compliant all of the time.  # Multinodular goiter longstanding.  Last ultrasound was in August  2013, showed largest nodule to be 4.1 cm in the isthmus.  She has occasional neck discomfort and feeling of choking but no difficulty swallowing and has remained stable for several years.  # Hypercalcemia : She has occasional high serum calcium however has remained upper normal recently.  She does take hydrochlorothiazide for hypertension.  PTH was low normal previously. Upper normal calcium levels without hyperparathyroidism, stable and may be related to hydrochlorothiazide.  Interval history Glucometer data as reviewed above.  Patient was diagnosed with pancreatic cancer in January 2025, currently on chemotherapy.  She has not been receiving steroid with the chemotherapy.  There is a possible plan for pancreatic surgery and radiation therapy.  Blood sugar as reviewed above mostly acceptable.  Hemoglobin A1c today 7.1% likely falsely low due to anemia.  She has no other complaints today.  Recent laboratory results reviewed.  Stable renal function.  She has anemia with a hemoglobin of 10.  Urine microalbumin creatinine ratio normal.  REVIEW OF SYSTEMS As per history of present illness.   PAST MEDICAL HISTORY: 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 Not seen on most recent TEE 2019.   Cardiomyopathy in other disease    Cataracts, bilateral    Diabetes mellitus  Type 2   Generalized osteoarthritis    GERD (gastroesophageal reflux disease)    HLD (hyperlipidemia)    Hypertension    Hypothyroidism    LBBB (left bundle branch block)    PAF (paroxysmal atrial fibrillation) (HCC)    a. s/p DCCV 06/2017, on Xarelto   Phlebitis    S/P TAVR (transcatheter aortic valve replacement) 04/15/2018   Edwards Sapien 3 THV (size 23 mm, model # 9600TFX, serial # [id number]) via the TF approach   Severe aortic stenosis    a. s/p TAVR 04/2018.   Stroke [facility]) 2008    PAST SURGICAL HISTORY: Past  Surgical History:  Procedure Laterality Date   ABDOMINAL HYSTERECTOMY     BILIARY BRUSHING  10/16/2023   Procedure: BILIARY BRUSHING;  Surgeon: [name], MD;  Location: [facility] ENDOSCOPY;  Service: Gastroenterology;;   BILIARY STENT PLACEMENT N/A 10/16/2023   Procedure: BILIARY STENT PLACEMENT;  Surgeon: [name], MD;  Location: [facility] ENDOSCOPY;  Service: Gastroenterology;  Laterality: N/A;   BIOPSY  05/19/2019   Procedure: BIOPSY;  Surgeon: Bernette Redbird, MD;  Location: [facility] ENDOSCOPY;  Service: Endoscopy;;   BREAST BIOPSY Left    years ago at "a doctor's office"   COLONOSCOPY     COLONOSCOPY WITH PROPOFOL N/A 10/02/2018   Procedure: COLONOSCOPY WITH PROPOFOL;  Surgeon: [name], MD;  Location: [facility] ENDOSCOPY;  Service: Endoscopy;  Laterality: N/A;   ENTEROSCOPY N/A 05/19/2019   Procedure: ENTEROSCOPY;  Surgeon: Bernette Redbird, MD;  Location: [facility] ENDOSCOPY;  Service: Endoscopy;  Laterality: N/A;   ERCP N/A 10/16/2023   Procedure: ENDOSCOPIC RETROGRADE CHOLANGIOPANCREATOGRAPHY (ERCP);  Surgeon: [name], MD;  Location: [facility] ENDOSCOPY;  Service: Gastroenterology;  Laterality: N/A;   ESOPHAGOGASTRODUODENOSCOPY (EGD) WITH PROPOFOL N/A 10/02/2018   Procedure: ESOPHAGOGASTRODUODENOSCOPY (EGD) WITH PROPOFOL;  Surgeon: [name], MD;  Location: [facility] ENDOSCOPY;  Service: Endoscopy;  Laterality: N/A;   ESOPHAGOGASTRODUODENOSCOPY (EGD) WITH PROPOFOL N/A 04/23/2019   Procedure: ESOPHAGOGASTRODUODENOSCOPY (EGD) WITH PROPOFOL;  Surgeon: Graylin Shiver, MD;  Location: [facility] ENDOSCOPY;  Service: Endoscopy;  Laterality: N/A;   ESOPHAGOGASTRODUODENOSCOPY (EGD) WITH PROPOFOL N/A 10/16/2023   Procedure: ESOPHAGOGASTRODUODENOSCOPY (EGD) WITH PROPOFOL;  Surgeon: [name], MD;  Location: [facility] ENDOSCOPY;  Service: Gastroenterology;  Laterality: N/A;   EUS N/A 10/16/2023   Procedure: FULL UPPER ENDOSCOPIC ULTRASOUND (EUS) RADIAL;  Surgeon: [name], MD;  Location: [facility] ENDOSCOPY;  Service:  Gastroenterology;  Laterality: N/A;   EYE SURGERY Bilateral    cataract removal   FINE NEEDLE ASPIRATION N/A 10/16/2023   Procedure: FINE NEEDLE ASPIRATION (FNA) LINEAR;  Surgeon: [name], MD;  Location: [facility] ENDOSCOPY;  Service: Gastroenterology;  Laterality: N/A;   GIVENS CAPSULE STUDY N/A 10/02/2018   Procedure: GIVENS CAPSULE STUDY;  Surgeon: [name], MD;  Location: [facility] ENDOSCOPY;  Service: Endoscopy;  Laterality: N/A;   IR IMAGING GUIDED PORT INSERTION  01/10/2024   RIGHT/LEFT HEART CATH AND CORONARY ANGIOGRAPHY N/A 03/06/2018   Procedure: RIGHT/LEFT HEART CATH AND CORONARY ANGIOGRAPHY;  Surgeon: Lyn Records, MD;  Location: [facility] INVASIVE CV LAB;  Service: Cardiovascular;  Laterality: N/A;   SMALL BOWEL ENTEROSCOPY  05/19/2019   SPHINCTEROTOMY  10/16/2023   Procedure: SPHINCTEROTOMY;  Surgeon: [name], MD;  Location: [facility] ENDOSCOPY;  Service: Gastroenterology;;   TEE WITHOUT CARDIOVERSION N/A 04/01/2018   Procedure: TRANSESOPHAGEAL ECHOCARDIOGRAM (TEE);  Surgeon: Jake Bathe, MD;  Location: [facility] ENDOSCOPY;  Service: Cardiovascular;  Laterality: N/A;   TEE WITHOUT CARDIOVERSION N/A 04/15/2018   Procedure: TRANSESOPHAGEAL ECHOCARDIOGRAM (TEE);  Surgeon: Tonny Bollman, MD;  Location: [facility] OR;  Service: Open Heart Surgery;  Laterality: N/A;   TOTAL KNEE ARTHROPLASTY Right 04/14/2013   Dr August Saucer   TOTAL KNEE ARTHROPLASTY Right 04/14/2013   Procedure: TOTAL KNEE ARTHROPLASTY;  Surgeon: Cammy Copa, MD;  Location: [facility] OR;  Service: Orthopedics;  Laterality: Right;   TRANSCATHETER AORTIC VALVE REPLACEMENT, TRANSFEMORAL  04/15/2018   TRANSCATHETER AORTIC VALVE REPLACEMENT, TRANSFEMORAL Bilateral 04/15/2018   Procedure: TRANSCATHETER AORTIC VALVE REPLACEMENT, TRANSFEMORAL;  Surgeon: Tonny Bollman, MD;  Location: [facility] OR;  Service: Open Heart Surgery;  Laterality: Bilateral;    ALLERGIES: Allergies  Allergen Reactions   Ace Inhibitors Swelling and Other (See Comments)     Angioedema    Keflex [Cephalexin] Swelling and Other (See Comments)    Lips became swollwn   Rifadin [Rifampin] Swelling and Other (See Comments)    Lips became swollen    FAMILY HISTORY:  Family History  Problem Relation Age of Onset   Stroke Mother    Hypertension Mother    Hypertension Father    Heart attack Neg Hx     SOCIAL HISTORY: Social History   Socioeconomic History   Marital status: Widowed    Spouse name: Not on file   Number of children: Not on file   Years of education: Not on file   Highest education level: Not on file  Occupational History   Not on file  Tobacco Use   Smoking status: Never   Smokeless tobacco: Never  Vaping Use   Vaping status: Never Used  Substance and Sexual Activity   Alcohol use: No   Drug use: No   Sexual activity: Not Currently  Other Topics Concern   Not on file  Social History Narrative   Not on file   Social Drivers of Health   Financial Resource Strain: Not on file  Food Insecurity: Not on file  Transportation Needs: Not on file  Physical Activity: Not on file  Stress: Not on file  Social Connections: Not on file    MEDICATIONS:  Current Outpatient Medications  Medication Sig Dispense Refill   acetaminophen (TYLENOL) 325 MG tablet Take 325-650 mg by mouth every 8 (eight) hours as needed (for headaches).     amLODipine (NORVASC) 5 MG tablet Take 5 mg by mouth daily.     Blood Glucose Monitoring Suppl (ACCU-CHEK GUIDE ME) w/Device KIT by Does not apply route.     Blood Glucose Monitoring Suppl (ONE TOUCH ULTRA 2) w/Device KIT Use to check blood sugars three times daily DX: E11.9 1 kit 0   Cholecalciferol (VITAMIN D3) 5000 units CAPS Take 5,000 Units by mouth daily.     hydrochlorothiazide (HYDRODIURIL) 25 MG tablet Take 25 mg by mouth daily.      hydrOXYzine (ATARAX/VISTARIL) 25 MG tablet Take 25 mg by mouth 3 (three) times daily as needed.     Lancets (ONETOUCH DELICA PLUS LANCET33G) MISC TEST BLOOD SUGAR THREE  TIMES DAILY 300 each 10   loperamide (IMODIUM) 2 MG capsule Take 1 capsule (2 mg total) by mouth as needed for diarrhea or loose stools. 30 capsule 1   metFORMIN (GLUCOPHAGE-XR) 750 MG 24 hr tablet TAKE 2 TABLETS ONE TIME DAILY WITH BREAKFAST 180 tablet 2   metoprolol succinate (TOPROL-XL) 50 MG 24 hr tablet Take 50 mg by mouth daily.      ondansetron (ZOFRAN) 8 MG tablet Take 1 tablet (8 mg total) by mouth every 8 (eight) hours as needed for nausea or vomiting. 20 tablet 3  ONETOUCH ULTRA TEST test strip TEST BLOOD SUGAR THREE TIMES DAILY 300 strip 3   Polyethyl Glycol-Propyl Glycol (SYSTANE OP) Place 1 drop into both eyes 2 (two) times daily.      potassium chloride SA (KLOR-CON M) 20 MEQ tablet Take 1 tablet (20 mEq total) by mouth 2 (two) times daily. 60 tablet 0   prochlorperazine (COMPAZINE) 10 MG tablet Take 1 tablet (10 mg total) by mouth every 6 (six) hours as needed for nausea or vomiting. 20 tablet 3   simvastatin (ZOCOR) 20 MG tablet Take 20 mg by mouth every evening.     No current facility-administered medications for this visit.   Facility-Administered Medications Ordered in Other Visits  Medication Dose Route Frequency Provider Last Rate Last Admin   0.9 %  sodium chloride infusion   Intravenous Continuous Malachy Mood, MD   Stopped at 12/11/23 1305   0.9 %  sodium chloride infusion   Intravenous Continuous Malachy Mood, MD   Stopped at 12/11/23 1304    PHYSICAL EXAM: Vitals:   01/13/24 0814 01/13/24 0815  BP: (!) 156/70 (!) 152/70  Pulse: 77   Resp: 20   SpO2: 97%   Weight: 154 lb 12.8 oz (70.2 kg)   Height: 5\' 3"  (1.6 m)     Body mass index is 27.42 kg/m.  Wt Readings from Last 3 Encounters:  01/13/24 154 lb 12.8 oz (70.2 kg)  01/08/24 156 lb 3.2 oz (70.9 kg)  12/25/23 155 lb 14.4 oz (70.7 kg)    General: Well developed, well nourished female in no apparent distress.  HEENT: AT/[place], no external lesions.  Eyes: Conjunctiva clear and no icterus. Neck: Neck supple.   Thyromegaly with isthmus nodule about 3 cm, mobile soft, nontender present. Lungs: Respirations not labored Neurologic: Alert, oriented, normal speech Extremities / Skin: Dry.  Psychiatric: Does not appear depressed or anxious  Diabetic Foot Exam - Simple   No data filed    LABS Reviewed Lab Results  Component Value Date   HGBA1C 7.1 (A) 01/13/2024   HGBA1C 7.6 (A) 07/15/2023   HGBA1C 6.8 (H) 01/15/2023   Lab Results  Component Value Date   FRUCTOSAMINE 250 12/03/2019   FRUCTOSAMINE 247 08/03/2019   Lab Results  Component Value Date   CHOL 137 01/07/2024   HDL 42 (L) 01/07/2024   LDLCALC 78 01/07/2024   TRIG 91 01/07/2024   CHOLHDL 3.3 01/07/2024   Lab Results  Component Value Date   MICRALBCREAT 5 01/07/2024   MICRALBCREAT 4.0 01/15/2023   Lab Results  Component Value Date   CREATININE 0.57 01/08/2024   Lab Results  Component Value Date   GFR 70.90 01/15/2023    ASSESSMENT / PLAN  1. Type 2 diabetes mellitus with other specified complication, without long-term current use of insulin (HCC)   2. Diabetes mellitus, stable (HCC)     Diabetes Mellitus type 2, complicated by stroke. - Diabetic status / severity: Fair control.  Lab Results  Component Value Date   HGBA1C 7.1 (A) 01/13/2024    - Hemoglobin A1c goal : <7.5%  Mostly acceptable glucometer blood sugar.  Hemoglobin A1c 7.1 likely falsely mildly low due to anemia.  Patient was recently diagnosed with pancreatic cancer currently on chemotherapy, not on a steroid with it.  Possible plan for pancreatic surgery in the future.  Patient is asked to call our clinic if she required any steroids in the future with the chemotherapy treatment, she needs additional antidiabetic medication most likely insulin therapy.  Patient also asked to call our clinic if she underwent pancreatic surgery she needs additional antidiabetic medication including insulin therapy as well at that time.  For now as blood sugars are  acceptable.  Will stay on the current regimen.  She needs closer follow-up for diabetes due to ongoing treatment for pancreatic cancer, will follow up in sooner visit in 2 months.  - Medications: No change.  I) metformin extended release 750 mg 2 tablets daily.  - Home glucose testing: At least in the morning fasting, and occasionally at bedtime. - Discussed/ Gave Hypoglycemia treatment plan.  # Consult : not required at this time.   # Annual urine for microalbuminuria/ creatinine ratio, no microalbuminuria currently. Last  Lab Results  Component Value Date   MICRALBCREAT 5 01/07/2024    # Foot check nightly / neuropathy.  # Annual dilated diabetic eye exams.   - Diet: Make healthy diabetic food choices - Life style / activity / exercise: Discussed.  2. Blood pressure  -  BP Readings from Last 1 Encounters:  01/13/24 (!) 152/70    - Control is not in target. Mildly elevated blood pressure.  Asymptomatic. - No change in current plans.  No symptoms.  Advised to talk with primary care provider.  3. Lipid status / Hyperlipidemia - Last  Lab Results  Component Value Date   LDLCALC 78 01/07/2024   - Continue simvastatin 20 mg daily.  # Multinodular goiter : Longstanding.  Last ultrasound in 2013 with largest nodule measuring 4.1 cm in the isthmus. -Will continue to monitor. ?  Consider to repeat ultrasound thyroid in the future. -Annual thyroid lab.  She is euthyroid and not on thyroid medication.  She had normal thyroid function test with TSH of 1.189 in November 2024.   Diagnoses and all orders for this visit:  Type 2 diabetes mellitus with other specified complication, without long-term current use of insulin (HCC)  Diabetes mellitus, stable (HCC) -     POCT glycosylated hemoglobin (Hb A1C)     DISPOSITION Follow up in clinic in 2 months suggested.   All questions answered and patient verbalized understanding of the plan.  [place] Lugenia Assefa, MD [facility]  Endocrinology [facility] [address] [place], [address] [place], [place] [address] Phone # [phone]  At least part of this note was generated using voice recognition software. Inadvertent word errors may have occurred, which were not recognized during the proofreading process.

## 2024-01-14 ENCOUNTER — Other Ambulatory Visit: Payer: Self-pay

## 2024-01-15 ENCOUNTER — Inpatient Hospital Stay

## 2024-01-15 ENCOUNTER — Other Ambulatory Visit: Payer: Self-pay

## 2024-01-15 ENCOUNTER — Inpatient Hospital Stay: Admitting: Dietician

## 2024-01-15 VITALS — BP 165/60 | HR 70 | Temp 98.9°F | Resp 16 | Wt 155.5 lb

## 2024-01-15 DIAGNOSIS — Z5111 Encounter for antineoplastic chemotherapy: Secondary | ICD-10-CM | POA: Diagnosis not present

## 2024-01-15 DIAGNOSIS — C250 Malignant neoplasm of head of pancreas: Secondary | ICD-10-CM | POA: Diagnosis not present

## 2024-01-15 DIAGNOSIS — Z95828 Presence of other vascular implants and grafts: Secondary | ICD-10-CM | POA: Insufficient documentation

## 2024-01-15 DIAGNOSIS — Z79899 Other long term (current) drug therapy: Secondary | ICD-10-CM | POA: Diagnosis not present

## 2024-01-15 DIAGNOSIS — E876 Hypokalemia: Secondary | ICD-10-CM | POA: Diagnosis not present

## 2024-01-15 LAB — CBC WITH DIFFERENTIAL (CANCER CENTER ONLY)
Abs Immature Granulocytes: 0.01 10*3/uL (ref 0.00–0.07)
Basophils Absolute: 0 10*3/uL (ref 0.0–0.1)
Basophils Relative: 0 %
Eosinophils Absolute: 0 10*3/uL (ref 0.0–0.5)
Eosinophils Relative: 0 %
HCT: 29.1 % — ABNORMAL LOW (ref 36.0–46.0)
Hemoglobin: 9.3 g/dL — ABNORMAL LOW (ref 12.0–15.0)
Immature Granulocytes: 0 %
Lymphocytes Relative: 36 %
Lymphs Abs: 2.1 10*3/uL (ref 0.7–4.0)
MCH: 23.8 pg — ABNORMAL LOW (ref 26.0–34.0)
MCHC: 32 g/dL (ref 30.0–36.0)
MCV: 74.6 fL — ABNORMAL LOW (ref 80.0–100.0)
Monocytes Absolute: 0.3 10*3/uL (ref 0.1–1.0)
Monocytes Relative: 6 %
Neutro Abs: 3.3 10*3/uL (ref 1.7–7.7)
Neutrophils Relative %: 58 %
Platelet Count: 239 10*3/uL (ref 150–400)
RBC: 3.9 MIL/uL (ref 3.87–5.11)
RDW: 17.7 % — ABNORMAL HIGH (ref 11.5–15.5)
WBC Count: 5.8 10*3/uL (ref 4.0–10.5)
nRBC: 0 % (ref 0.0–0.2)

## 2024-01-15 LAB — CMP (CANCER CENTER ONLY)
ALT: 8 U/L (ref 0–44)
AST: 14 U/L — ABNORMAL LOW (ref 15–41)
Albumin: 3.5 g/dL (ref 3.5–5.0)
Alkaline Phosphatase: 52 U/L (ref 38–126)
Anion gap: 5 (ref 5–15)
BUN: 7 mg/dL — ABNORMAL LOW (ref 8–23)
CO2: 34 mmol/L — ABNORMAL HIGH (ref 22–32)
Calcium: 9.6 mg/dL (ref 8.9–10.3)
Chloride: 101 mmol/L (ref 98–111)
Creatinine: 0.5 mg/dL (ref 0.44–1.00)
GFR, Estimated: >60 mL/min (ref >60)
Glucose, Bld: 143 mg/dL — ABNORMAL HIGH (ref 70–99)
Potassium: 3.3 mmol/L — ABNORMAL LOW (ref 3.5–5.1)
Sodium: 140 mmol/L (ref 135–145)
Total Bilirubin: 0.3 mg/dL (ref 0.0–1.2)
Total Protein: 7.3 g/dL (ref 6.5–8.1)

## 2024-01-15 MED ORDER — SODIUM CHLORIDE 0.9 % IV SOLN
1000.0000 mg/m2 | Freq: Once | INTRAVENOUS | Status: AC
  Administered 2024-01-15: 1824 mg via INTRAVENOUS
  Filled 2024-01-15: qty 47.97

## 2024-01-15 MED ORDER — SODIUM CHLORIDE 0.9 % IV SOLN: INTRAVENOUS | Status: DC

## 2024-01-15 MED ORDER — SODIUM CHLORIDE 0.9% FLUSH
10.0000 mL | Freq: Once | INTRAVENOUS | Status: AC
Start: 2024-01-15 — End: 2024-01-15
  Administered 2024-01-15: 10 mL

## 2024-01-15 MED ORDER — PROCHLORPERAZINE MALEATE 10 MG PO TABS
10.0000 mg | ORAL_TABLET | Freq: Once | ORAL | Status: AC
Start: 2024-01-15 — End: 2024-01-15
  Administered 2024-01-15: 10 mg via ORAL
  Filled 2024-01-15: qty 1

## 2024-01-15 MED ORDER — LIDOCAINE-PRILOCAINE 2.5-2.5 % EX CREA: 1.0000 | TOPICAL_CREAM | CUTANEOUS | 2 refills | Status: DC | PRN

## 2024-01-15 NOTE — Progress Notes (Signed)
 Patient here for port flush/labs.  States need Lidocaine cream called to [facility], [place].  Secure chatted Dr. Mosetta Putt, Jonny Ruiz, and [place].  Olegario Shearer states she will call Rx into pharmacy

## 2024-01-15 NOTE — Progress Notes (Signed)
 Nutrition Follow-up:  Patient with pancreatic cancer. She is currently receiving gemcitabine + abraxane (start 2/5; abraxane added 3/5).   Met with patient in infusion. Granddaughter present for visit. Patient reports decreased appetite and off taste of foods Friday-Sunday. She did not eat much on these days. Pt able to drink some glucerna, however this did not taste good either. Family and friends are keeping her stocked with prepared meals. Yesterday pt had glucerna, small piece of sausage, half piece of bread for breakfast. Unable to recall lunch. Patient recalls small pork chop, cabbage, sweet potato for dinner. She is drinking glucerna, but not daily. Patient reports improved BM with Creon. She has ran out of this. Denies nausea, vomiting.   Medications: reviewed  Labs: Hgb 9.3, K 3.3, glucose 143, BUN 7  Anthropometrics: Wt 155 lb 8 oz today - stable   3/19 - 155 lb 14.4 oz    NUTRITION DIAGNOSIS: Unintended wt loss - stable x 3 weeks   INTERVENTION:  Educated on strategies for taste/smell changes, suggested trying baking soda salt water rinses before meals - handout with tips + recipe  Recommend daily Glucerna - samples + coupons Message to Dr. Mosetta Putt with Creon refill request    MONITORING, EVALUATION, GOAL: wt trends, intake   NEXT VISIT: Wednesday May 7 during infusion

## 2024-01-16 ENCOUNTER — Other Ambulatory Visit: Payer: Self-pay | Admitting: Hematology

## 2024-01-16 ENCOUNTER — Other Ambulatory Visit: Payer: Self-pay

## 2024-01-16 MED ORDER — PANCRELIPASE (LIP-PROT-AMYL) 36000-114000 UNITS PO CPEP: ORAL_CAPSULE | ORAL | 2 refills | Status: DC

## 2024-01-21 NOTE — Progress Notes (Signed)
 I have spoken with patient's daughter twice since her port placement 01/13/24 to check on her after the weekend of getting her port inserted per her daughter Thersia Flax she had minimal soreness and she is doing fine she stated she would have her first infusion on 01/15/24. I called this morning to ensure she didn't have any issues during her infusion and Thersia Flax stated she had a great treatment and is doing well. I advised her to call the clinic if any concerns arise, she verbalized understanding.

## 2024-01-22 ENCOUNTER — Ambulatory Visit

## 2024-01-22 ENCOUNTER — Ambulatory Visit: Admitting: Nurse Practitioner

## 2024-01-22 ENCOUNTER — Other Ambulatory Visit

## 2024-01-29 ENCOUNTER — Ambulatory Visit (HOSPITAL_COMMUNITY)
Admission: RE | Admit: 2024-01-29 | Discharge: 2024-01-29 | Disposition: A | Discharge status: Home / Self Care | Source: Ambulatory Visit | Attending: Hematology | Admitting: Hematology

## 2024-01-29 DIAGNOSIS — C259 Malignant neoplasm of pancreas, unspecified: Secondary | ICD-10-CM | POA: Diagnosis not present

## 2024-01-29 DIAGNOSIS — C250 Malignant neoplasm of head of pancreas: Secondary | ICD-10-CM | POA: Insufficient documentation

## 2024-01-29 DIAGNOSIS — C481 Malignant neoplasm of specified parts of peritoneum: Secondary | ICD-10-CM | POA: Diagnosis not present

## 2024-01-29 DIAGNOSIS — K769 Liver disease, unspecified: Secondary | ICD-10-CM | POA: Diagnosis not present

## 2024-01-29 MED ORDER — SODIUM CHLORIDE (PF) 0.9 % IJ SOLN
INTRAMUSCULAR | Status: AC
  Filled 2024-01-29: qty 50

## 2024-01-29 MED ORDER — IOHEXOL 350 MG/ML SOLN
100.0000 mL | Freq: Once | INTRAVENOUS | Status: AC | PRN
  Administered 2024-01-29: 100 mL via INTRAVENOUS

## 2024-02-05 ENCOUNTER — Other Ambulatory Visit

## 2024-02-05 ENCOUNTER — Inpatient Hospital Stay

## 2024-02-05 ENCOUNTER — Inpatient Hospital Stay (HOSPITAL_BASED_OUTPATIENT_CLINIC_OR_DEPARTMENT_OTHER): Admitting: Hematology

## 2024-02-05 VITALS — BP 151/67

## 2024-02-05 VITALS — BP 186/67 | HR 70 | Temp 97.2°F | Resp 18 | Ht 63.0 in | Wt 151.2 lb

## 2024-02-05 DIAGNOSIS — C250 Malignant neoplasm of head of pancreas: Secondary | ICD-10-CM | POA: Diagnosis not present

## 2024-02-05 DIAGNOSIS — Z79899 Other long term (current) drug therapy: Secondary | ICD-10-CM | POA: Diagnosis not present

## 2024-02-05 DIAGNOSIS — Z5111 Encounter for antineoplastic chemotherapy: Secondary | ICD-10-CM | POA: Diagnosis not present

## 2024-02-05 DIAGNOSIS — Z95828 Presence of other vascular implants and grafts: Secondary | ICD-10-CM

## 2024-02-05 DIAGNOSIS — E876 Hypokalemia: Secondary | ICD-10-CM | POA: Diagnosis not present

## 2024-02-05 LAB — CMP (CANCER CENTER ONLY)
ALT: 8 U/L (ref 0–44)
AST: 12 U/L — ABNORMAL LOW (ref 15–41)
Albumin: 3.8 g/dL (ref 3.5–5.0)
Alkaline Phosphatase: 67 U/L (ref 38–126)
Anion gap: 4 — ABNORMAL LOW (ref 5–15)
BUN: 5 mg/dL — ABNORMAL LOW (ref 8–23)
CO2: 32 mmol/L (ref 22–32)
Calcium: 9.8 mg/dL (ref 8.9–10.3)
Chloride: 100 mmol/L (ref 98–111)
Creatinine: 0.55 mg/dL (ref 0.44–1.00)
GFR, Estimated: >60 mL/min (ref >60)
Glucose, Bld: 132 mg/dL — ABNORMAL HIGH (ref 70–99)
Potassium: 3.5 mmol/L (ref 3.5–5.1)
Sodium: 136 mmol/L (ref 135–145)
Total Bilirubin: 0.4 mg/dL (ref 0.0–1.2)
Total Protein: 7.6 g/dL (ref 6.5–8.1)

## 2024-02-05 LAB — CBC WITH DIFFERENTIAL (CANCER CENTER ONLY)
Abs Immature Granulocytes: 0.01 10*3/uL (ref 0.00–0.07)
Basophils Absolute: 0 10*3/uL (ref 0.0–0.1)
Basophils Relative: 1 %
Eosinophils Absolute: 0 10*3/uL (ref 0.0–0.5)
Eosinophils Relative: 0 %
HCT: 34 % — ABNORMAL LOW (ref 36.0–46.0)
Hemoglobin: 10.7 g/dL — ABNORMAL LOW (ref 12.0–15.0)
Immature Granulocytes: 0 %
Lymphocytes Relative: 23 %
Lymphs Abs: 1.8 10*3/uL (ref 0.7–4.0)
MCH: 24 pg — ABNORMAL LOW (ref 26.0–34.0)
MCHC: 31.5 g/dL (ref 30.0–36.0)
MCV: 76.4 fL — ABNORMAL LOW (ref 80.0–100.0)
Monocytes Absolute: 0.6 10*3/uL (ref 0.1–1.0)
Monocytes Relative: 8 %
Neutro Abs: 5.3 10*3/uL (ref 1.7–7.7)
Neutrophils Relative %: 68 %
Platelet Count: 386 10*3/uL (ref 150–400)
RBC: 4.45 MIL/uL (ref 3.87–5.11)
RDW: 19 % — ABNORMAL HIGH (ref 11.5–15.5)
WBC Count: 7.8 10*3/uL (ref 4.0–10.5)
nRBC: 0 % (ref 0.0–0.2)

## 2024-02-05 MED ORDER — SODIUM CHLORIDE 0.9% FLUSH
10.0000 mL | Freq: Once | INTRAVENOUS | Status: AC
  Administered 2024-02-05: 10 mL

## 2024-02-05 MED ORDER — OXYCODONE HCL 5 MG PO TABS: 5.0000 mg | ORAL_TABLET | Freq: Once | ORAL | Status: DC

## 2024-02-05 MED ORDER — OXYCODONE HCL 5 MG PO TABS
5.0000 mg | ORAL_TABLET | Freq: Once | ORAL | Status: AC
  Administered 2024-02-05: 5 mg via ORAL
  Filled 2024-02-05: qty 1

## 2024-02-05 MED ORDER — OXYCODONE HCL 5 MG PO TABS: 5.0000 mg | ORAL_TABLET | Freq: Three times a day (TID) | ORAL | 0 refills | Status: DC | PRN

## 2024-02-05 MED ORDER — PROCHLORPERAZINE MALEATE 10 MG PO TABS
10.0000 mg | ORAL_TABLET | Freq: Once | ORAL | Status: AC
  Administered 2024-02-05: 10 mg via ORAL
  Filled 2024-02-05: qty 1

## 2024-02-05 MED ORDER — PANTOPRAZOLE SODIUM 20 MG PO TBEC: 20.0000 mg | DELAYED_RELEASE_TABLET | Freq: Every day | ORAL | 0 refills | Status: DC

## 2024-02-05 MED ORDER — SODIUM CHLORIDE 0.9 % IV SOLN
1000.0000 mg/m2 | Freq: Once | INTRAVENOUS | Status: AC
  Administered 2024-02-05: 1824 mg via INTRAVENOUS
  Filled 2024-02-05: qty 47.97

## 2024-02-05 MED ORDER — SODIUM CHLORIDE 0.9% FLUSH
10.0000 mL | INTRAVENOUS | Status: DC | PRN
Start: 2024-02-05 — End: 2024-02-05
  Administered 2024-02-05: 10 mL

## 2024-02-05 MED ORDER — SODIUM CHLORIDE 0.9 % IV SOLN: INTRAVENOUS | Status: DC

## 2024-02-05 MED ORDER — HEPARIN SOD (PORK) LOCK FLUSH 100 UNIT/ML IV SOLN
500.0000 [IU] | Freq: Once | INTRAVENOUS | Status: AC | PRN
  Administered 2024-02-05: 500 [IU]

## 2024-02-05 NOTE — Progress Notes (Signed)
 [facility]   Telephone:([phone] Fax:([phone]   Clinic Follow up Note   Patient Care Team: Benedetto Brady, MD as PCP - General (Family Medicine) Hugh Madura, MD as PCP - Cardiology (Cardiology) Sonja Garfield, MD as Consulting Physician (Hematology) Evangeline Hilts, MD as Consulting Physician (Gastroenterology) Ozell Blunt, MD as Consulting Physician (Gastroenterology)  Date of Service:  02/05/2024  CHIEF COMPLAINT: f/u of pancreatic cancer  CURRENT THERAPY:  Gemcitabine   Oncology History   Malignant neoplasm of head of pancreas (HCC) T3N0 by EUS, questionable peritoneal carcinomatosis -She had an episode of abdominal pain, nausea, and dizziness at church and came to ED 08/18/2023 -CT AP done 10/08/2023 showing a stable solid right lower lobe pulmonary nodule from 2019 and an ill-defined hypodense lesion in the pancreatic head measuring 4.4 x 3.9 x 3.3 cm obstructing the pancreatic and CBD appearing to encase and occlude the SMV and portal confluence with soft tissue stranding extending along the mesenteric root with nodular foci peritoneal soft tissue nodularity anterior to the mass; overall concerning for primary pancreatic malignancy with peritoneal carcinomatosis.    -EUS 10/16/23 by Dr. Kimble Pennant showing a T3 N0 pancreatic head mass invading the SMA and ERCP with stenting by Dr. Lavaughn Portland, cytology confirming adenocarcinoma.  -Due to her age, the vascular invasion/abutment of the SMA, and possible peritoneal involvement, she is likely not a surgical candidate but Dr. Cherlynn Cornfield has reviewed her case and agreed to see the pt for surgical discussion. She was seen on 11/26/2023 and felt to be a poor candidate for surgery  -PET scan showed no definite evidence of metastatic disease.  The questionable peritoneal involvement on CT is felt to be local extension -plan to start chemo gemcitabine  alone, and may add Abraxane  from second cycle if tolerates well. She started chemo on  11/13/2023, she did not tolerate gem/Abraxane , and chemo changed back to gemcitabine  alone  -restaging CT 4/23 showed sable pancreatic mass but a new 1.1cm liver lesion which is suspicious for new met, will get liver MRI   Assessment & Plan Pancreatic cancer with possible liver metastasis Pancreatic cancer is well-managed based on the last scan. A new hepatic lesion raises concern for possible metastasis, indicating potential disease progression and lack of response to current chemotherapy. Due to her age, treatment options are limited, and the focus is on comfort rather than aggressive treatment. She agrees with prioritizing comfort, influenced by her sister's recent experience with cancer. - Order MRI of the liver to evaluate the new hepatic lesion for metastasis. - Administer chemotherapy today. - Discuss treatment options after MRI results are available.  Abdominal pain Abdominal pain began yesterday, described as dull and rated 8/10 in severity. Tylenol  was ineffective. Pain may be related to pancreatic cancer or possible liver metastasis. - Prescribe oxycodone  for pain management, to be taken every 8 hours as needed, with the option to increase to every 6 hours if necessary. - Administer one dose of oxycodone  in the infusion room today. - Advise monitoring bowel movements and using laxatives like Miralax to prevent constipation. - Prescribe a proton pump inhibitor to be taken once daily in the morning.  Goals of Care She prioritizes comfort over aggressive treatment, aligning with her values and influenced by her sister's recent death and experience with cancer.  Plan - I reviewed her restaging CT scan, which is concerning for new liver metastasis.  I ordered abdominal MRI with and without contrast for further evaluation - I called in oxycodone  5 mg as  needed for her abdominal pain, will give her 1 dose in the infusion room - Will proceed chemotherapy today.  Follow-up in 2  weeks     SUMMARY OF ONCOLOGIC HISTORY: Oncology History  Malignant neoplasm of head of pancreas (HCC)  10/16/2023 Cancer Staging   Staging form: Exocrine Pancreas, AJCC 8th Edition - Clinical stage from 10/16/2023: Stage IIA (cT3, cN0, cM0) - Signed by Sonja , MD on 11/20/2023 Total positive nodes: 0   10/24/2023 Initial Diagnosis   Malignant neoplasm of head of pancreas (HCC)   11/13/2023 -  Chemotherapy   Patient is on Treatment Plan : PANCREATIC Abraxane  D1,8,15 + Gemcitabine  D1,8,15 q28d     12/04/2023 Genetic Testing   Negative genetic testing on the CancerNext+RNA panel.  BRCA1  p.D695N (c.2083G>A)  VUS identified.  The report date is 12/03/2023.  The [facility] CancerNext+RNAinsight Panel includes sequencing, rearrangement analysis, and RNA analysis for the following 39 genes: APC, ATM, BAP1, BARD1, BMPR1A, BRCA1, BRCA2, BRIP1, CDH1, CDKN2A, CHEK2, FH, FLCN, MET, MLH1, MSH2, MSH6, MUTYH, NF1, NTHL1, PALB2, PMS2, PTEN, RAD51C, RAD51D, SMAD4, STK11, TP53, TSC1, TSC2, and VHL (sequencing and deletion/duplication); AXIN2, HOXB13, MBD4, MSH3, POLD1 and POLE (sequencing only); EPCAM and GREM1 (deletion/duplication only).       Discussed the use of AI scribe software for clinical note transcription with the patient, who gave verbal consent to proceed.  History of Present Illness Janet Williamson is an 83 year old female with pancreatic cancer who presents with abdominal pain.  Abdominal pain began yesterday, characterized as a dull ache with a severity of 8 out of 10, disrupting sleep. Tylenol  was ineffective. She has previously used hydrocodone and oxycodone  for pain, with oxycodone  providing better relief. She is considering using oxycodone  as needed.  Recent imaging shows stable pancreatic cancer with a new liver spot, pending further evaluation. She has a port for treatment administration.  She has no issues with eating or drinking. Her daughter is actively involved in her care. She  recently lost her sister to cancer, which has been emotionally challenging.     All other systems were reviewed with the patient and are negative.  MEDICAL HISTORY:  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 Not seen on most recent TEE 2019.   Cardiomyopathy in other disease    Cataracts, bilateral    Diabetes mellitus    Type 2   Generalized osteoarthritis    GERD (gastroesophageal reflux disease)    HLD (hyperlipidemia)    Hypertension    Hypothyroidism    LBBB (left bundle branch block)    PAF (paroxysmal atrial fibrillation) (HCC)    a. s/p DCCV 06/2017, on Xarelto    Phlebitis    S/P TAVR (transcatheter aortic valve replacement) 04/15/2018   Edwards Sapien 3 THV (size 23 mm, model # 9600TFX, serial # [id number]) via the TF approach   Severe aortic stenosis    a. s/p TAVR 04/2018.   Stroke [facility]) 2008    SURGICAL HISTORY: Past Surgical History:  Procedure Laterality Date   ABDOMINAL HYSTERECTOMY     BILIARY BRUSHING  10/16/2023   Procedure: BILIARY BRUSHING;  Surgeon: Evangeline Hilts, MD;  Location: [name] ENDOSCOPY;  Service: Gastroenterology;;   BILIARY STENT PLACEMENT N/A 10/16/2023   Procedure: BILIARY STENT PLACEMENT;  Surgeon: Evangeline Hilts, MD;  Location: [facility] ENDOSCOPY;  Service: Gastroenterology;  Laterality: N/A;   BIOPSY  05/19/2019   Procedure: BIOPSY;  Surgeon: Lanita Pitman, MD;  Location: [facility] ENDOSCOPY;  Service: Endoscopy;;   BREAST BIOPSY Left    years ago at "a doctor's office"   COLONOSCOPY     COLONOSCOPY WITH PROPOFOL  N/A 10/02/2018   Procedure: COLONOSCOPY WITH PROPOFOL ;  Surgeon: Evangeline Hilts, MD;  Location: [facility] ENDOSCOPY;  Service: Endoscopy;  Laterality: N/A;   ENTEROSCOPY N/A 05/19/2019   Procedure: ENTEROSCOPY;  Surgeon: Lanita Pitman, MD;  Location: [facility] ENDOSCOPY;  Service: Endoscopy;  Laterality: N/A;   ERCP N/A 10/16/2023   Procedure: ENDOSCOPIC  RETROGRADE CHOLANGIOPANCREATOGRAPHY (ERCP);  Surgeon: Evangeline Hilts, MD;  Location: [name];  Service: Gastroenterology;  Laterality: N/A;   ESOPHAGOGASTRODUODENOSCOPY (EGD) WITH PROPOFOL  N/A 10/02/2018   Procedure: ESOPHAGOGASTRODUODENOSCOPY (EGD) WITH PROPOFOL ;  Surgeon: Evangeline Hilts, MD;  Location: [facility] ENDOSCOPY;  Service: Endoscopy;  Laterality: N/A;   ESOPHAGOGASTRODUODENOSCOPY (EGD) WITH PROPOFOL  N/A 04/23/2019   Procedure: ESOPHAGOGASTRODUODENOSCOPY (EGD) WITH PROPOFOL ;  Surgeon: Celedonio Coil, MD;  Location: [facility] ENDOSCOPY;  Service: Endoscopy;  Laterality: N/A;   ESOPHAGOGASTRODUODENOSCOPY (EGD) WITH PROPOFOL  N/A 10/16/2023   Procedure: ESOPHAGOGASTRODUODENOSCOPY (EGD) WITH PROPOFOL ;  Surgeon: Evangeline Hilts, MD;  Location: [facility] ENDOSCOPY;  Service: Gastroenterology;  Laterality: N/A;   EUS N/A 10/16/2023   Procedure: FULL UPPER ENDOSCOPIC ULTRASOUND (EUS) RADIAL;  Surgeon: Evangeline Hilts, MD;  Location: [facility] ENDOSCOPY;  Service: Gastroenterology;  Laterality: N/A;   EYE SURGERY Bilateral    cataract removal   FINE NEEDLE ASPIRATION N/A 10/16/2023   Procedure: FINE NEEDLE ASPIRATION (FNA) LINEAR;  Surgeon: Evangeline Hilts, MD;  Location: [facility] ENDOSCOPY;  Service: Gastroenterology;  Laterality: N/A;   GIVENS CAPSULE STUDY N/A 10/02/2018   Procedure: GIVENS CAPSULE STUDY;  Surgeon: Evangeline Hilts, MD;  Location: [facility] ENDOSCOPY;  Service: Endoscopy;  Laterality: N/A;   IR IMAGING GUIDED PORT INSERTION  01/10/2024   RIGHT/LEFT HEART CATH AND CORONARY ANGIOGRAPHY N/A 03/06/2018   Procedure: RIGHT/LEFT HEART CATH AND CORONARY ANGIOGRAPHY;  Surgeon: Arty Binning, MD;  Location: [facility] INVASIVE CV LAB;  Service: Cardiovascular;  Laterality: N/A;   SMALL BOWEL ENTEROSCOPY  05/19/2019   SPHINCTEROTOMY  10/16/2023   Procedure: SPHINCTEROTOMY;  Surgeon: Evangeline Hilts, MD;  Location: [facility] ENDOSCOPY;  Service: Gastroenterology;;   TEE WITHOUT CARDIOVERSION N/A 04/01/2018   Procedure: TRANSESOPHAGEAL  ECHOCARDIOGRAM (TEE);  Surgeon: Hugh Madura, MD;  Location: [facility] ENDOSCOPY;  Service: Cardiovascular;  Laterality: N/A;   TEE WITHOUT CARDIOVERSION N/A 04/15/2018   Procedure: TRANSESOPHAGEAL ECHOCARDIOGRAM (TEE);  Surgeon: Arnoldo Lapping, MD;  Location: [facility] OR;  Service: Open Heart Surgery;  Laterality: N/A;   TOTAL KNEE ARTHROPLASTY Right 04/14/2013   Dr Rozelle Corning   TOTAL KNEE ARTHROPLASTY Right 04/14/2013   Procedure: TOTAL KNEE ARTHROPLASTY;  Surgeon: Jasmine Mesi, MD;  Location: [facility] OR;  Service: Orthopedics;  Laterality: Right;   TRANSCATHETER AORTIC VALVE REPLACEMENT, TRANSFEMORAL  04/15/2018   TRANSCATHETER AORTIC VALVE REPLACEMENT, TRANSFEMORAL Bilateral 04/15/2018   Procedure: TRANSCATHETER AORTIC VALVE REPLACEMENT, TRANSFEMORAL;  Surgeon: Arnoldo Lapping, MD;  Location: [facility] OR;  Service: Open Heart Surgery;  Laterality: Bilateral;    I have reviewed the social history and family history with the patient and they are unchanged from previous note.  ALLERGIES:  is allergic to ace inhibitors, keflex [cephalexin], and rifadin [rifampin].  MEDICATIONS:  Current Outpatient Medications  Medication Sig Dispense Refill   pantoprazole  (PROTONIX ) 20 MG tablet Take 1 tablet (20 mg total) by mouth daily. 30 tablet 0   acetaminophen  (TYLENOL ) 325 MG tablet Take 325-650 mg by mouth every 8 (  eight) hours as needed (for headaches).     amLODipine  (NORVASC ) 5 MG tablet Take 5 mg by mouth daily.     Blood Glucose Monitoring Suppl (ACCU-CHEK GUIDE ME) w/Device KIT by Does not apply route.     Blood Glucose Monitoring Suppl (ONE TOUCH ULTRA 2) w/Device KIT Use to check blood sugars three times daily DX: E11.9 1 kit 0   Cholecalciferol  (VITAMIN D3) 5000 units CAPS Take 5,000 Units by mouth daily.     hydrochlorothiazide  (HYDRODIURIL ) 25 MG tablet Take 25 mg by mouth daily.      hydrOXYzine (ATARAX/VISTARIL) 25 MG tablet Take 25 mg by mouth 3 (three) times daily as needed.     Lancets (ONETOUCH DELICA PLUS  LANCET33G) MISC TEST BLOOD SUGAR THREE TIMES DAILY 300 each 10   lidocaine -prilocaine  (EMLA ) cream Apply 1 Application topically as needed. Apply a quarter size amount of cream to port-a-cath site 45 mins to 1 hr prior to port-a-cath being access. 30 g 2   lipase/protease/amylase (CREON ) 36000 UNITS CPEP capsule Take 2 capsules (72,000 Units total) by mouth 3 (three) times daily with meals. May also take 1 capsule (36,000 Units total) as needed (with snacks - up to 4 snacks daily). 300 capsule 2   loperamide  (IMODIUM ) 2 MG capsule Take 1 capsule (2 mg total) by mouth as needed for diarrhea or loose stools. 30 capsule 1   metFORMIN  (GLUCOPHAGE -XR) 750 MG 24 hr tablet TAKE 2 TABLETS ONE TIME DAILY WITH BREAKFAST 180 tablet 2   metoprolol  succinate (TOPROL -XL) 50 MG 24 hr tablet Take 50 mg by mouth daily.      ondansetron  (ZOFRAN ) 8 MG tablet Take 1 tablet (8 mg total) by mouth every 8 (eight) hours as needed for nausea or vomiting. 20 tablet 3   ONETOUCH ULTRA TEST test strip TEST BLOOD SUGAR THREE TIMES DAILY 300 strip 3   oxyCODONE  (OXY IR/ROXICODONE ) 5 MG immediate release tablet Take 1 tablet (5 mg total) by mouth every 8 (eight) hours as needed for severe pain (pain score 7-10). 30 tablet 0   Polyethyl Glycol-Propyl Glycol (SYSTANE OP) Place 1 drop into both eyes 2 (two) times daily.      potassium chloride  SA (KLOR-CON  M) 20 MEQ tablet Take 1 tablet (20 mEq total) by mouth 2 (two) times daily. 60 tablet 0   prochlorperazine  (COMPAZINE ) 10 MG tablet Take 1 tablet (10 mg total) by mouth every 6 (six) hours as needed for nausea or vomiting. 20 tablet 3   simvastatin  (ZOCOR ) 20 MG tablet Take 20 mg by mouth every evening.     No current facility-administered medications for this visit.   Facility-Administered Medications Ordered in Other Visits  Medication Dose Route Frequency Provider Last Rate Last Admin   0.9 %  sodium chloride  infusion   Intravenous Continuous Sonja [place], MD   Stopped at  12/11/23 1305   0.9 %  sodium chloride  infusion   Intravenous Continuous Sonja [place]al[place], MD   Stopped at 12/11/23 1304   0.9 %  sodium chloride  infusion   Intravenous Continuous Sonja [place], MD   Stopped at 02/05/24 1514   sodium chloride  flush (NS) 0.9 % injection 10 mL  10 mL Intracatheter PRN Sonja , MD   10 mL at 02/05/24 1514    PHYSICAL EXAMINATION: ECOG PERFORMANCE STATUS: 2 - Symptomatic, <50% confined to bed  Vitals:   02/05/24 1306 02/05/24 1308  BP: (!) 183/67 (!) 186/67  Pulse: 70   Resp: 18   Temp: Aaron Aas)  97.2 F (36.2 C)   SpO2: 99%    Wt Readings from Last 3 Encounters:  02/05/24 151 lb 3 oz (68.6 kg)  01/15/24 155 lb 8 oz (70.5 kg)  01/13/24 154 lb 12.8 oz (70.2 kg)     GENERAL:alert, no distress and comfortable SKIN: skin color, texture, turgor are normal, no rashes or significant lesions EYES: normal, Conjunctiva are pink and non-injected, sclera clear NECK: supple, thyroid  normal size, non-tender, without nodularity LYMPH:  no palpable lymphadenopathy in the cervical, axillary  LUNGS: clear to auscultation and percussion with normal breathing effort HEART: regular rate & rhythm and no murmurs and no lower extremity edema ABDOMEN:abdomen soft, non-tender and normal bowel sounds Musculoskeletal:no cyanosis of digits and no clubbing  NEURO: alert & oriented x 3 with fluent speech, no focal motor/sensory deficits  Physical Exam    LABORATORY DATA:  I have reviewed the data as listed    Latest Ref Rng & Units 02/05/2024   12:25 PM 01/15/2024   12:38 PM 01/08/2024    8:30 AM  CBC  WBC 4.0 - 10.5 K/uL 7.8  5.8  7.9   Hemoglobin 12.0 - 15.0 g/dL 40.9  9.3  81.1   Hematocrit 36.0 - 46.0 % 34.0  29.1  32.6   Platelets 150 - 400 K/uL 386  239  272         Latest Ref Rng & Units 02/05/2024   12:25 PM 01/15/2024   12:38 PM 01/08/2024    8:30 AM  CMP  Glucose 70 - 99 mg/dL 914  782  956   BUN 8 - 23 mg/dL 5  7  9    Creatinine 0.44 - 1.00 mg/dL 2.13  0.86  5.78    Sodium 135 - 145 mmol/L 136  140  138   Potassium 3.5 - 5.1 mmol/L 3.5  3.3  3.8   Chloride 98 - 111 mmol/L 100  101  101   CO2 22 - 32 mmol/L 32  34  32   Calcium  8.9 - 10.3 mg/dL 9.8  9.6  46.9   Total Protein 6.5 - 8.1 g/dL 7.6  7.3  7.4   Total Bilirubin 0.0 - 1.2 mg/dL 0.4  0.3  0.3   Alkaline Phos 38 - 126 U/L 67  52  60   AST 15 - 41 U/L 12  14  11    ALT 0 - 44 U/L 8  8  7        RADIOGRAPHIC STUDIES: I have personally reviewed the radiological images as listed and agreed with the findings in the report. No results found.    Orders Placed This Encounter  Procedures   MR LIVER W WO CONTRAST    Standing Status:   Future    Expected Date:   02/10/2024    Expiration Date:   02/04/2025    If indicated for the ordered procedure, I authorize the administration of contrast media per Radiology protocol:   Yes    What is the patient's sedation requirement?:   No Sedation    Does the patient have a pacemaker or implanted devices?:   No    Preferred imaging location?:   [facility] (table limit - 550 lbs)   All questions were answered. The patient knows to call the clinic with any problems, questions or concerns. No barriers to learning was detected. The total time spent in the appointment was 30 minutes.     Sonja [place], MD 02/05/2024

## 2024-02-05 NOTE — Assessment & Plan Note (Signed)
 T3N0 by EUS, questionable peritoneal carcinomatosis -She had an episode of abdominal pain, nausea, and dizziness at church and came to ED 08/18/2023 -CT AP done 10/08/2023 showing a stable solid right lower lobe pulmonary nodule from 2019 and an ill-defined hypodense lesion in the pancreatic head measuring 4.4 x 3.9 x 3.3 cm obstructing the pancreatic and CBD appearing to encase and occlude the SMV and portal confluence with soft tissue stranding extending along the mesenteric root with nodular foci peritoneal soft tissue nodularity anterior to the mass; overall concerning for primary pancreatic malignancy with peritoneal carcinomatosis.    -EUS 10/16/23 by Dr. Kimble Pennant showing a T3 N0 pancreatic head mass invading the SMA and ERCP with stenting by Dr. Lavaughn Portland, cytology confirming adenocarcinoma.  -Due to her age, the vascular invasion/abutment of the SMA, and possible peritoneal involvement, she is likely not a surgical candidate but Dr. Cherlynn Cornfield has reviewed her case and agreed to see the pt for surgical discussion. She was seen on 11/26/2023 and felt to be a poor candidate for surgery  -PET scan showed no definite evidence of metastatic disease.  The questionable peritoneal involvement on CT is felt to be local extension -plan to start chemo gemcitabine  alone, and may add Abraxane  from second cycle if tolerates well. She started chemo on 11/13/2023, she did not tolerate gem/Abraxane , and chemo changed back to gemcitabine  alone  -restaging CT 4/23 showed sable pancreatic mass but a new 1.1cm liver lesion which is suspicious for new met, will get liver MRI

## 2024-02-05 NOTE — Patient Instructions (Signed)
 [facility] - A DEPT OF [facility]  Discharge Instructions: Thank you for choosing [facility] to provide your oncology and hematology care.   If you have a lab appointment with the Cancer Center, please go directly to the Cancer Center and check in at the registration area.   Wear comfortable clothing and clothing appropriate for easy access to any Portacath or PICC line.   We strive to give you quality time with your provider. You may need to reschedule your appointment if you arrive late (15 or more minutes).  Arriving late affects you and other patients whose appointments are after yours.  Also, if you miss three or more appointments without notifying the office, you may be dismissed from the clinic at the provider's discretion.      For prescription refill requests, have your pharmacy contact our office and allow 72 hours for refills to be completed.    Today you received the following chemotherapy and/or immunotherapy agents gemzar      To help prevent nausea and vomiting after your treatment, we encourage you to take your nausea medication as directed.  BELOW ARE SYMPTOMS THAT SHOULD BE REPORTED IMMEDIATELY: *FEVER GREATER THAN 100.4 F (38 C) OR HIGHER *CHILLS OR SWEATING *NAUSEA AND VOMITING THAT IS NOT CONTROLLED WITH YOUR NAUSEA MEDICATION *UNUSUAL SHORTNESS OF BREATH *UNUSUAL BRUISING OR BLEEDING *URINARY PROBLEMS (pain or burning when urinating, or frequent urination) *BOWEL PROBLEMS (unusual diarrhea, constipation, pain near the anus) TENDERNESS IN MOUTH AND THROAT WITH OR WITHOUT PRESENCE OF ULCERS (sore throat, sores in mouth, or a toothache) UNUSUAL RASH, SWELLING OR PAIN  UNUSUAL VAGINAL DISCHARGE OR ITCHING   Items with * indicate a potential emergency and should be followed up as soon as possible or go to the Emergency Department if any problems should occur.  Please show the CHEMOTHERAPY ALERT CARD or IMMUNOTHERAPY  ALERT CARD at check-in to the Emergency Department and triage nurse.  Should you have questions after your visit or need to cancel or reschedule your appointment, please contact [facility] - A DEPT OF [facility]  Dept: [phone]  and follow the prompts.  Office hours are 8:00 a.m. to 4:30 p.m. Monday - Friday. Please note that voicemails left after 4:00 p.m. may not be returned until the following business day.  We are closed weekends and major holidays. You have access to a nurse at all times for urgent questions. Please call the main number to the clinic Dept: [phone] and follow the prompts.   For any non-urgent questions, you may also contact your provider using MyChart. We now offer e-Visits for anyone 19 and older to request care online for non-urgent symptoms. For details visit [email or web address].   Also download the MyChart app! Go to the app store, search "MyChart", open the app, select [facility], and log in with your MyChart username and password.

## 2024-02-07 ENCOUNTER — Other Ambulatory Visit: Payer: Self-pay

## 2024-02-09 ENCOUNTER — Other Ambulatory Visit: Payer: Self-pay

## 2024-02-09 ENCOUNTER — Emergency Department (HOSPITAL_COMMUNITY)
Admission: EM | Admit: 2024-02-09 | Discharge: 2024-02-09 | Disposition: A | Discharge status: Home / Self Care | Attending: Emergency Medicine | Admitting: Emergency Medicine

## 2024-02-09 ENCOUNTER — Encounter (HOSPITAL_COMMUNITY): Payer: Self-pay

## 2024-02-09 ENCOUNTER — Emergency Department (HOSPITAL_COMMUNITY)

## 2024-02-09 DIAGNOSIS — Z7984 Long term (current) use of oral hypoglycemic drugs: Secondary | ICD-10-CM | POA: Diagnosis not present

## 2024-02-09 DIAGNOSIS — Z79899 Other long term (current) drug therapy: Secondary | ICD-10-CM | POA: Insufficient documentation

## 2024-02-09 DIAGNOSIS — R1013 Epigastric pain: Secondary | ICD-10-CM | POA: Diagnosis not present

## 2024-02-09 DIAGNOSIS — I10 Essential (primary) hypertension: Secondary | ICD-10-CM | POA: Diagnosis not present

## 2024-02-09 DIAGNOSIS — I4891 Unspecified atrial fibrillation: Secondary | ICD-10-CM | POA: Diagnosis not present

## 2024-02-09 DIAGNOSIS — R002 Palpitations: Secondary | ICD-10-CM | POA: Diagnosis present

## 2024-02-09 DIAGNOSIS — E119 Type 2 diabetes mellitus without complications: Secondary | ICD-10-CM | POA: Insufficient documentation

## 2024-02-09 DIAGNOSIS — R0602 Shortness of breath: Secondary | ICD-10-CM | POA: Diagnosis not present

## 2024-02-09 DIAGNOSIS — I700 Atherosclerosis of aorta: Secondary | ICD-10-CM | POA: Diagnosis not present

## 2024-02-09 LAB — CBC WITH DIFFERENTIAL/PLATELET
Abs Immature Granulocytes: 0.04 10*3/uL (ref 0.00–0.07)
Basophils Absolute: 0.1 10*3/uL (ref 0.0–0.1)
Basophils Relative: 1 %
Eosinophils Absolute: 0.1 10*3/uL (ref 0.0–0.5)
Eosinophils Relative: 1 %
HCT: 31.6 % — ABNORMAL LOW (ref 36.0–46.0)
Hemoglobin: 9.7 g/dL — ABNORMAL LOW (ref 12.0–15.0)
Immature Granulocytes: 0 %
Lymphocytes Relative: 26 %
Lymphs Abs: 2.6 10*3/uL (ref 0.7–4.0)
MCH: 24.4 pg — ABNORMAL LOW (ref 26.0–34.0)
MCHC: 30.7 g/dL (ref 30.0–36.0)
MCV: 79.6 fL — ABNORMAL LOW (ref 80.0–100.0)
Monocytes Absolute: 0.3 10*3/uL (ref 0.1–1.0)
Monocytes Relative: 3 %
Neutro Abs: 7.1 10*3/uL (ref 1.7–7.7)
Neutrophils Relative %: 69 %
Platelets: 321 10*3/uL (ref 150–400)
RBC: 3.97 MIL/uL (ref 3.87–5.11)
RDW: 18.8 % — ABNORMAL HIGH (ref 11.5–15.5)
WBC: 10 10*3/uL (ref 4.0–10.5)
nRBC: 0 % (ref 0.0–0.2)

## 2024-02-09 LAB — COMPREHENSIVE METABOLIC PANEL WITH GFR
ALT: 25 U/L (ref 0–44)
AST: 28 U/L (ref 15–41)
Albumin: 3.1 g/dL — ABNORMAL LOW (ref 3.5–5.0)
Alkaline Phosphatase: 60 U/L (ref 38–126)
Anion gap: 9 (ref 5–15)
BUN: 15 mg/dL (ref 8–23)
CO2: 27 mmol/L (ref 22–32)
Calcium: 9.4 mg/dL (ref 8.9–10.3)
Chloride: 99 mmol/L (ref 98–111)
Creatinine, Ser: 0.79 mg/dL (ref 0.44–1.00)
GFR, Estimated: >60 mL/min (ref >60)
Glucose, Bld: 146 mg/dL — ABNORMAL HIGH (ref 70–99)
Potassium: 3.8 mmol/L (ref 3.5–5.1)
Sodium: 135 mmol/L (ref 135–145)
Total Bilirubin: 0.5 mg/dL (ref 0.0–1.2)
Total Protein: 7.7 g/dL (ref 6.5–8.1)

## 2024-02-09 LAB — TROPONIN I (HIGH SENSITIVITY)
Troponin I (High Sensitivity): 71 ng/L — ABNORMAL HIGH (ref <18)
Troponin I (High Sensitivity): 77 ng/L — ABNORMAL HIGH

## 2024-02-09 LAB — MAGNESIUM: Magnesium: 1.6 mg/dL — ABNORMAL LOW (ref 1.7–2.4)

## 2024-02-09 MED ORDER — RIVAROXABAN (XARELTO) EDUCATION KIT FOR AFIB PATIENTS
PACK | Freq: Once | Status: AC
  Filled 2024-02-09: qty 1

## 2024-02-09 MED ORDER — METOPROLOL TARTRATE 25 MG PO TABS: 25.0000 mg | ORAL_TABLET | Freq: Every day | ORAL | 0 refills | Status: DC | PRN

## 2024-02-09 MED ORDER — SODIUM CHLORIDE 0.9 % IV BOLUS
1000.0000 mL | Freq: Once | INTRAVENOUS | Status: AC
  Administered 2024-02-09: 1000 mL via INTRAVENOUS

## 2024-02-09 MED ORDER — RIVAROXABAN 20 MG PO TABS: 20.0000 mg | ORAL_TABLET | Freq: Every day | ORAL | 0 refills | Status: DC

## 2024-02-09 MED ORDER — MAGNESIUM SULFATE 2 GM/50ML IV SOLN
2.0000 g | Freq: Once | INTRAVENOUS | Status: AC
  Administered 2024-02-09: 2 g via INTRAVENOUS
  Filled 2024-02-09: qty 50

## 2024-02-09 MED ORDER — DILTIAZEM HCL 25 MG/5ML IV SOLN
10.0000 mg | Freq: Once | INTRAVENOUS | Status: AC
  Administered 2024-02-09: 10 mg via INTRAVENOUS
  Filled 2024-02-09: qty 5

## 2024-02-09 MED ORDER — RIVAROXABAN 20 MG PO TABS
20.0000 mg | ORAL_TABLET | Freq: Once | ORAL | Status: AC
  Administered 2024-02-09: 20 mg via ORAL
  Filled 2024-02-09: qty 1

## 2024-02-09 MED ORDER — RIVAROXABAN 20 MG PO TABS: 20.0000 mg | ORAL_TABLET | Freq: Every day | ORAL | 0 refills | Status: AC

## 2024-02-09 MED ORDER — DILTIAZEM HCL-DEXTROSE 125-5 MG/125ML-% IV SOLN (PREMIX)
5.0000 mg/h | INTRAVENOUS | Status: DC
Start: 2024-02-09 — End: 2024-02-09
  Filled 2024-02-09: qty 125

## 2024-02-09 NOTE — ED Notes (Signed)
 Pt has converted to NSR; obtained EKG and will hold cardizem  drip. MD notified.

## 2024-02-09 NOTE — Discharge Instructions (Addendum)
 As we discussed, you have atrial fibrillation  I recommend you start taking Xarelto  20 mg daily  I recommend that you take lopressor  25 mg as needed if your heart rate is greater than 120. If your heart rate does not improve with the Lopressor  within an hour, please return to the ER  I have referred you to cardiology for follow-up.  They should contact you next week regarding follow-up  Return to ER if you have worse palpitations or chest pain or shortness of breath

## 2024-02-09 NOTE — ED Triage Notes (Signed)
 Pt came in for palpitations and shortness of breath that started on Thursday. Pt stated she has not cardiac history.

## 2024-02-09 NOTE — ED Provider Notes (Signed)
 [place] EMERGENCY DEPARTMENT AT [facility] Provider Note   CSN: [id number] Arrival date & time: 02/09/24  1906     History  Chief Complaint  Patient presents with   Shortness of Breath        Palpitations         Janet Williamson is a 83 y.o. female history of diabetes, hypertension, metastatic pancreatic cancer on chemotherapy, here presenting with palpitations.  Patient has been having palpitations for the last 2 to 3 days.  Patient states that it is constant.  Denies any chest pain.  No history of A-fib in the past  The history is provided by the patient.       Home Medications Prior to Admission medications   Medication Sig Start Date End Date Taking? Authorizing Provider  acetaminophen  (TYLENOL ) 325 MG tablet Take 325-650 mg by mouth every 8 (eight) hours as needed (for headaches).   Yes [provider]  amLODipine  (NORVASC ) 5 MG tablet Take 5 mg by mouth daily.   Yes [provider]  Cholecalciferol  (VITAMIN D3) 5000 units CAPS Take 5,000 Units by mouth daily.   Yes [provider]  hydrochlorothiazide  (HYDRODIURIL ) 25 MG tablet Take 25 mg by mouth daily.    Yes [provider]  lidocaine -prilocaine  (EMLA ) cream Apply 1 Application topically as needed. Apply a quarter size amount of cream to port-a-cath site 45 mins to 1 hr prior to port-a-cath being access. Patient taking differently: Apply 1 Application topically as needed (Apply a quarter size amount of cream to port-a-cath site 45 mins to 1 hr prior to port-a-cath being acces). 01/15/24  Yes Sonja [place], MD  loperamide  (IMODIUM ) 2 MG capsule Take 1 capsule (2 mg total) by mouth as needed for diarrhea or loose stools. 11/12/23  Yes Burton, Lacie K, NP  metFORMIN  (GLUCOPHAGE -XR) 750 MG 24 hr tablet TAKE 2 TABLETS ONE TIME DAILY WITH BREAKFAST 11/27/23  Yes Thapa, [place], MD  Polyethyl Glycol-Propyl Glycol (SYSTANE OP) Place 1 drop into both eyes 2 (two) times daily.    Yes  [provider]  potassium chloride  SA (KLOR-CON  M) 20 MEQ tablet Take 1 tablet (20 mEq total) by mouth 2 (two) times daily. Patient taking differently: Take 20 mEq by mouth daily. 11/12/23  Yes Burton, Lacie K, NP  simvastatin  (ZOCOR ) 20 MG tablet Take 20 mg by mouth at bedtime.   Yes [provider]  Blood Glucose Monitoring Suppl (ACCU-CHEK GUIDE ME) w/Device KIT by Does not apply route.    [provider]  Blood Glucose Monitoring Suppl (ONE TOUCH ULTRA 2) w/Device KIT Use to check blood sugars three times daily DX: E11.9 08/23/20   Lajean Pike, MD  Lancets [facility] DELICA PLUS [place]) MISC TEST BLOOD SUGAR THREE TIMES DAILY 08/16/22   Lajean Pike, MD  lipase/protease/amylase (CREON ) 36000 UNITS CPEP capsule Take 2 capsules (72,000 Units total) by mouth 3 (three) times daily with meals. May also take 1 capsule (36,000 Units total) as needed (with snacks - up to 4 snacks daily). 01/16/24   Sonja , MD  metoprolol  succinate (TOPROL -XL) 50 MG 24 hr tablet Take 50 mg by mouth daily.     [provider]  ondansetron  (ZOFRAN ) 8 MG tablet Take 1 tablet (8 mg total) by mouth every 8 (eight) hours as needed for nausea or vomiting. 11/12/23   Burton, Lacie K, NP  ONETOUCH ULTRA TEST test strip TEST BLOOD SUGAR THREE TIMES DAILY 04/12/23   Lajean Pike, MD  oxyCODONE  (  OXY IR/ROXICODONE ) 5 MG immediate release tablet Take 1 tablet (5 mg total) by mouth every 8 (eight) hours as needed for severe pain (pain score 7-10). 02/05/24   Sonja [place], MD  pantoprazole  (PROTONIX ) 20 MG tablet Take 1 tablet (20 mg total) by mouth daily. 02/05/24   Sonja [place], MD  prochlorperazine  (COMPAZINE ) 10 MG tablet Take 1 tablet (10 mg total) by mouth every 6 (six) hours as needed for nausea or vomiting. Patient not taking: Reported on 02/09/2024 11/12/23   Burton, Lacie K, NP      Allergies    Ace inhibitors, Keflex [cephalexin], and Rifadin [rifampin]    Review of Systems   Review of Systems   Respiratory:  Positive for shortness of breath.   Cardiovascular:  Positive for palpitations.  All other systems reviewed and are negative.   Physical Exam Updated Vital Signs BP 118/61   Pulse 68   Temp 98.2 F (36.8 C) (Oral)   Resp 19   SpO2 100%  Physical Exam Vitals and nursing note reviewed.  Constitutional:      Comments: Chronically ill-appearing  HENT:     Head: Normocephalic.     Mouth/Throat:     Mouth: Mucous membranes are moist.     Pharynx: Oropharynx is clear.  Eyes:     Extraocular Movements: Extraocular movements intact.     Pupils: Pupils are equal, round, and reactive to light.  Cardiovascular:     Rate and Rhythm: Tachycardia present. Rhythm irregular.  Pulmonary:     Effort: Pulmonary effort is normal.     Breath sounds: Normal breath sounds.  Abdominal:     Palpations: Abdomen is soft.     Comments: Mild epigastric tenderness which is chronic due to her metastatic pancreatic cancer.  Musculoskeletal:        General: Normal range of motion.     Cervical back: Normal range of motion and neck supple.  Skin:    General: Skin is warm.     Capillary Refill: Capillary refill takes less than 2 seconds.  Neurological:     General: No focal deficit present.     Mental Status: She is oriented to person, place, and time.  Psychiatric:        Mood and Affect: Mood normal.        Behavior: Behavior normal.     ED Results / Procedures / Treatments   Labs (all labs ordered are listed, but only abnormal results are displayed) Labs Reviewed  CBC WITH DIFFERENTIAL/PLATELET - Abnormal; Notable for the following components:      Result Value   Hemoglobin 9.7 (*)    HCT 31.6 (*)    MCV 79.6 (*)    MCH 24.4 (*)    RDW 18.8 (*)    All other components within normal limits  COMPREHENSIVE METABOLIC PANEL WITH GFR - Abnormal; Notable for the following components:   Glucose, Bld 146 (*)    Albumin 3.1 (*)    All other components within normal limits   MAGNESIUM  - Abnormal; Notable for the following components:   Magnesium  1.6 (*)    All other components within normal limits  TROPONIN I (HIGH SENSITIVITY) - Abnormal; Notable for the following components:   Troponin I (High Sensitivity) 71 (*)    All other components within normal limits  TROPONIN I (HIGH SENSITIVITY) - Abnormal; Notable for the following components:   Troponin I (High Sensitivity) 77 (*)    All other components within normal limits  EKG EKG Interpretation Date/Time:  Sunday Feb 09 2024 20:09:16 EDT Ventricular Rate:  72 PR Interval:  185 QRS Duration:  147 QT Interval:  447 QTC Calculation: 490 R Axis:   -31  Text Interpretation: Sinus rhythm Left bundle branch block previous tracing showed afib Confirmed by Florette Hurry ([id number]) on 02/09/2024 8:40:17 PM  Radiology DG Chest Port 1 View Result Date: 02/09/2024 CLINICAL DATA:  Shortness of breath EXAM: PORTABLE CHEST 1 VIEW COMPARISON:  05/17/2019 FINDINGS: Changes of prior TAVR are again seen. Aortic calcifications are noted. New right chest wall port is seen in satisfactory position. Lungs are well aerated bilaterally. No focal infiltrate or sizable effusion is seen. No bony abnormality is noted. IMPRESSION: No active disease. Electronically Signed   By: Violeta Grey M.D.   On: 02/09/2024 19:36    Procedures Procedures    Medications Ordered in ED Medications  magnesium  sulfate IVPB 2 g 50 mL (2 g Intravenous New Bag/Given 02/09/24 2113)  sodium chloride  0.9 % bolus 1,000 mL (0 mLs Intravenous Stopped 02/09/24 2112)  diltiazem  (CARDIZEM ) injection 10 mg (10 mg Intravenous Given 02/09/24 1928)    ED Course/ Medical Decision Making/ A&P                                 Medical Decision Making Zalika P Ryner is a 83 y.o. female here presenting with palpitations.  Patient has a history of metastatic pancreatic cancer.  Patient appears to be in rapid A-fib on initial EKG.  Patient has no chest pain or shortness  of breath.  I considered electrolyte abnormalities causing new onset A-fib.  I have low suspicion for PE or ACS as the etiology.  Plan to check CBC CMP and magnesium  level and troponin x 2.  Will give Cardizem  and reassess  9 pm Patient had an initial troponin of 71.  Patient converted to sinus rhythm now after 1 dose of Cardizem   9:57 PM Repeat troponin is 77.  Patient remains in sinus rhythm.  Patient's CHA2DS2-VASc score is 7 so will start on xarelto .  Will refer to cardiology outpatient   Problems Addressed: Atrial fibrillation with rapid ventricular response (HCC): acute illness or injury Hypomagnesemia: acute illness or injury  Amount and/or Complexity of Data Reviewed Labs: ordered. Decision-making details documented in ED Course. Radiology: ordered and independent interpretation performed. Decision-making details documented in ED Course.  Risk Prescription drug management.    Final Clinical Impression(s) / ED Diagnoses Final diagnoses:  Atrial fibrillation with rapid ventricular response (HCC)  Hypomagnesemia    Rx / DC Orders ED Discharge Orders     None         Dalene Duck, MD 02/09/24 2201

## 2024-02-10 ENCOUNTER — Ambulatory Visit (HOSPITAL_COMMUNITY)
Admission: RE | Admit: 2024-02-10 | Discharge: 2024-02-10 | Disposition: A | Discharge status: Home / Self Care | Source: Ambulatory Visit | Attending: Hematology | Admitting: Hematology

## 2024-02-10 DIAGNOSIS — T826XXA Infection and inflammatory reaction due to cardiac valve prosthesis, initial encounter: Secondary | ICD-10-CM | POA: Diagnosis present

## 2024-02-10 DIAGNOSIS — J9601 Acute respiratory failure with hypoxia: Secondary | ICD-10-CM | POA: Diagnosis not present

## 2024-02-10 DIAGNOSIS — I5031 Acute diastolic (congestive) heart failure: Secondary | ICD-10-CM | POA: Diagnosis not present

## 2024-02-10 DIAGNOSIS — R0902 Hypoxemia: Secondary | ICD-10-CM | POA: Diagnosis not present

## 2024-02-10 DIAGNOSIS — I358 Other nonrheumatic aortic valve disorders: Secondary | ICD-10-CM | POA: Diagnosis present

## 2024-02-10 DIAGNOSIS — Z8719 Personal history of other diseases of the digestive system: Secondary | ICD-10-CM | POA: Diagnosis not present

## 2024-02-10 DIAGNOSIS — R531 Weakness: Secondary | ICD-10-CM | POA: Diagnosis not present

## 2024-02-10 DIAGNOSIS — E039 Hypothyroidism, unspecified: Secondary | ICD-10-CM | POA: Diagnosis present

## 2024-02-10 DIAGNOSIS — E1165 Type 2 diabetes mellitus with hyperglycemia: Secondary | ICD-10-CM | POA: Diagnosis present

## 2024-02-10 DIAGNOSIS — I480 Paroxysmal atrial fibrillation: Secondary | ICD-10-CM | POA: Diagnosis present

## 2024-02-10 DIAGNOSIS — I339 Acute and subacute endocarditis, unspecified: Secondary | ICD-10-CM | POA: Diagnosis not present

## 2024-02-10 DIAGNOSIS — I499 Cardiac arrhythmia, unspecified: Secondary | ICD-10-CM | POA: Diagnosis not present

## 2024-02-10 DIAGNOSIS — Z8673 Personal history of transient ischemic attack (TIA), and cerebral infarction without residual deficits: Secondary | ICD-10-CM | POA: Diagnosis not present

## 2024-02-10 DIAGNOSIS — Z952 Presence of prosthetic heart valve: Secondary | ICD-10-CM | POA: Diagnosis not present

## 2024-02-10 DIAGNOSIS — I700 Atherosclerosis of aorta: Secondary | ICD-10-CM | POA: Diagnosis not present

## 2024-02-10 DIAGNOSIS — Z1152 Encounter for screening for COVID-19: Secondary | ICD-10-CM | POA: Diagnosis not present

## 2024-02-10 DIAGNOSIS — J810 Acute pulmonary edema: Secondary | ICD-10-CM | POA: Diagnosis present

## 2024-02-10 DIAGNOSIS — K8689 Other specified diseases of pancreas: Secondary | ICD-10-CM | POA: Diagnosis not present

## 2024-02-10 DIAGNOSIS — R7989 Other specified abnormal findings of blood chemistry: Secondary | ICD-10-CM | POA: Diagnosis not present

## 2024-02-10 DIAGNOSIS — I5033 Acute on chronic diastolic (congestive) heart failure: Secondary | ICD-10-CM | POA: Diagnosis present

## 2024-02-10 DIAGNOSIS — I38 Endocarditis, valve unspecified: Secondary | ICD-10-CM | POA: Diagnosis not present

## 2024-02-10 DIAGNOSIS — I771 Stricture of artery: Secondary | ICD-10-CM | POA: Diagnosis not present

## 2024-02-10 DIAGNOSIS — K769 Liver disease, unspecified: Secondary | ICD-10-CM | POA: Diagnosis present

## 2024-02-10 DIAGNOSIS — C250 Malignant neoplasm of head of pancreas: Secondary | ICD-10-CM | POA: Diagnosis present

## 2024-02-10 DIAGNOSIS — I110 Hypertensive heart disease with heart failure: Secondary | ICD-10-CM | POA: Diagnosis present

## 2024-02-10 DIAGNOSIS — E1100 Type 2 diabetes mellitus with hyperosmolarity without nonketotic hyperglycemic-hyperosmolar coma (NKHHC): Secondary | ICD-10-CM | POA: Diagnosis not present

## 2024-02-10 DIAGNOSIS — Z515 Encounter for palliative care: Secondary | ICD-10-CM | POA: Diagnosis not present

## 2024-02-10 DIAGNOSIS — R000 Tachycardia, unspecified: Secondary | ICD-10-CM | POA: Diagnosis not present

## 2024-02-10 DIAGNOSIS — C269 Malignant neoplasm of ill-defined sites within the digestive system: Secondary | ICD-10-CM | POA: Diagnosis not present

## 2024-02-10 DIAGNOSIS — K219 Gastro-esophageal reflux disease without esophagitis: Secondary | ICD-10-CM | POA: Diagnosis present

## 2024-02-10 DIAGNOSIS — D72819 Decreased white blood cell count, unspecified: Secondary | ICD-10-CM | POA: Diagnosis not present

## 2024-02-10 DIAGNOSIS — T82857D Stenosis of cardiac prosthetic devices, implants and grafts, subsequent encounter: Secondary | ICD-10-CM | POA: Diagnosis not present

## 2024-02-10 DIAGNOSIS — I5A Non-ischemic myocardial injury (non-traumatic): Secondary | ICD-10-CM | POA: Diagnosis not present

## 2024-02-10 DIAGNOSIS — R0689 Other abnormalities of breathing: Secondary | ICD-10-CM | POA: Diagnosis not present

## 2024-02-10 DIAGNOSIS — D509 Iron deficiency anemia, unspecified: Secondary | ICD-10-CM | POA: Diagnosis present

## 2024-02-10 DIAGNOSIS — A419 Sepsis, unspecified organism: Secondary | ICD-10-CM | POA: Diagnosis present

## 2024-02-10 DIAGNOSIS — I2489 Other forms of acute ischemic heart disease: Secondary | ICD-10-CM | POA: Diagnosis present

## 2024-02-10 DIAGNOSIS — I350 Nonrheumatic aortic (valve) stenosis: Secondary | ICD-10-CM | POA: Diagnosis not present

## 2024-02-10 DIAGNOSIS — J189 Pneumonia, unspecified organism: Secondary | ICD-10-CM | POA: Diagnosis present

## 2024-02-10 DIAGNOSIS — E1151 Type 2 diabetes mellitus with diabetic peripheral angiopathy without gangrene: Secondary | ICD-10-CM | POA: Diagnosis present

## 2024-02-10 DIAGNOSIS — Z66 Do not resuscitate: Secondary | ICD-10-CM | POA: Diagnosis present

## 2024-02-10 DIAGNOSIS — D6481 Anemia due to antineoplastic chemotherapy: Secondary | ICD-10-CM | POA: Diagnosis present

## 2024-02-10 DIAGNOSIS — R918 Other nonspecific abnormal finding of lung field: Secondary | ICD-10-CM | POA: Diagnosis not present

## 2024-02-10 DIAGNOSIS — I10 Essential (primary) hypertension: Secondary | ICD-10-CM | POA: Diagnosis not present

## 2024-02-10 DIAGNOSIS — R0602 Shortness of breath: Secondary | ICD-10-CM | POA: Diagnosis not present

## 2024-02-10 DIAGNOSIS — T82857A Stenosis of cardiac prosthetic devices, implants and grafts, initial encounter: Secondary | ICD-10-CM | POA: Diagnosis present

## 2024-02-10 DIAGNOSIS — D630 Anemia in neoplastic disease: Secondary | ICD-10-CM | POA: Diagnosis present

## 2024-02-10 DIAGNOSIS — Y831 Surgical operation with implant of artificial internal device as the cause of abnormal reaction of the patient, or of later complication, without mention of misadventure at the time of the procedure: Secondary | ICD-10-CM | POA: Diagnosis present

## 2024-02-10 DIAGNOSIS — Z4682 Encounter for fitting and adjustment of non-vascular catheter: Secondary | ICD-10-CM | POA: Diagnosis not present

## 2024-02-10 DIAGNOSIS — J9600 Acute respiratory failure, unspecified whether with hypoxia or hypercapnia: Secondary | ICD-10-CM | POA: Diagnosis not present

## 2024-02-10 MED ORDER — GADOBUTROL 1 MMOL/ML IV SOLN
7.0000 mL | Freq: Once | INTRAVENOUS | Status: AC | PRN
  Administered 2024-02-10: 7 mL via INTRAVENOUS

## 2024-02-12 ENCOUNTER — Other Ambulatory Visit

## 2024-02-12 ENCOUNTER — Other Ambulatory Visit: Payer: Self-pay

## 2024-02-12 ENCOUNTER — Encounter: Payer: Self-pay | Admitting: Hematology

## 2024-02-12 ENCOUNTER — Inpatient Hospital Stay: Attending: Nurse Practitioner

## 2024-02-12 ENCOUNTER — Inpatient Hospital Stay: Admitting: Dietician

## 2024-02-12 ENCOUNTER — Inpatient Hospital Stay (HOSPITAL_COMMUNITY)
Admission: EM | Admit: 2024-02-12 | Discharge: 2024-02-19 | DRG: 314 | Disposition: A | Discharge status: Home Health | Attending: Student | Admitting: Student

## 2024-02-12 ENCOUNTER — Emergency Department (HOSPITAL_COMMUNITY)

## 2024-02-12 ENCOUNTER — Inpatient Hospital Stay

## 2024-02-12 VITALS — BP 135/56 | HR 63 | Temp 98.1°F | Resp 18 | Wt 154.2 lb

## 2024-02-12 DIAGNOSIS — M159 Polyosteoarthritis, unspecified: Secondary | ICD-10-CM | POA: Diagnosis present

## 2024-02-12 DIAGNOSIS — J9601 Acute respiratory failure with hypoxia: Secondary | ICD-10-CM | POA: Diagnosis present

## 2024-02-12 DIAGNOSIS — Y831 Surgical operation with implant of artificial internal device as the cause of abnormal reaction of the patient, or of later complication, without mention of misadventure at the time of the procedure: Secondary | ICD-10-CM | POA: Diagnosis present

## 2024-02-12 DIAGNOSIS — I352 Nonrheumatic aortic (valve) stenosis with insufficiency: Secondary | ICD-10-CM | POA: Diagnosis present

## 2024-02-12 DIAGNOSIS — D630 Anemia in neoplastic disease: Secondary | ICD-10-CM | POA: Diagnosis present

## 2024-02-12 DIAGNOSIS — Z66 Do not resuscitate: Secondary | ICD-10-CM | POA: Diagnosis present

## 2024-02-12 DIAGNOSIS — Z79899 Other long term (current) drug therapy: Secondary | ICD-10-CM

## 2024-02-12 DIAGNOSIS — R0902 Hypoxemia: Secondary | ICD-10-CM | POA: Diagnosis not present

## 2024-02-12 DIAGNOSIS — D6481 Anemia due to antineoplastic chemotherapy: Secondary | ICD-10-CM | POA: Diagnosis present

## 2024-02-12 DIAGNOSIS — K769 Liver disease, unspecified: Secondary | ICD-10-CM | POA: Diagnosis present

## 2024-02-12 DIAGNOSIS — C250 Malignant neoplasm of head of pancreas: Secondary | ICD-10-CM | POA: Diagnosis present

## 2024-02-12 DIAGNOSIS — Z95828 Presence of other vascular implants and grafts: Secondary | ICD-10-CM

## 2024-02-12 DIAGNOSIS — E785 Hyperlipidemia, unspecified: Secondary | ICD-10-CM | POA: Diagnosis present

## 2024-02-12 DIAGNOSIS — I5033 Acute on chronic diastolic (congestive) heart failure: Secondary | ICD-10-CM

## 2024-02-12 DIAGNOSIS — J810 Acute pulmonary edema: Principal | ICD-10-CM | POA: Diagnosis present

## 2024-02-12 DIAGNOSIS — G893 Neoplasm related pain (acute) (chronic): Secondary | ICD-10-CM | POA: Diagnosis present

## 2024-02-12 DIAGNOSIS — T82857A Stenosis of cardiac prosthetic devices, implants and grafts, initial encounter: Secondary | ICD-10-CM

## 2024-02-12 DIAGNOSIS — Z9689 Presence of other specified functional implants: Secondary | ICD-10-CM | POA: Diagnosis present

## 2024-02-12 DIAGNOSIS — Z515 Encounter for palliative care: Secondary | ICD-10-CM

## 2024-02-12 DIAGNOSIS — Z1152 Encounter for screening for COVID-19: Secondary | ICD-10-CM

## 2024-02-12 DIAGNOSIS — I10 Essential (primary) hypertension: Secondary | ICD-10-CM | POA: Diagnosis present

## 2024-02-12 DIAGNOSIS — E1151 Type 2 diabetes mellitus with diabetic peripheral angiopathy without gangrene: Secondary | ICD-10-CM | POA: Diagnosis present

## 2024-02-12 DIAGNOSIS — T826XXA Infection and inflammatory reaction due to cardiac valve prosthesis, initial encounter: Principal | ICD-10-CM | POA: Diagnosis present

## 2024-02-12 DIAGNOSIS — I2489 Other forms of acute ischemic heart disease: Secondary | ICD-10-CM | POA: Diagnosis present

## 2024-02-12 DIAGNOSIS — C787 Secondary malignant neoplasm of liver and intrahepatic bile duct: Secondary | ICD-10-CM | POA: Insufficient documentation

## 2024-02-12 DIAGNOSIS — I358 Other nonrheumatic aortic valve disorders: Secondary | ICD-10-CM | POA: Diagnosis present

## 2024-02-12 DIAGNOSIS — I339 Acute and subacute endocarditis, unspecified: Principal | ICD-10-CM

## 2024-02-12 DIAGNOSIS — Z5111 Encounter for antineoplastic chemotherapy: Secondary | ICD-10-CM | POA: Insufficient documentation

## 2024-02-12 DIAGNOSIS — T82857D Stenosis of cardiac prosthetic devices, implants and grafts, subsequent encounter: Secondary | ICD-10-CM

## 2024-02-12 DIAGNOSIS — E1165 Type 2 diabetes mellitus with hyperglycemia: Secondary | ICD-10-CM | POA: Diagnosis present

## 2024-02-12 DIAGNOSIS — M545 Low back pain, unspecified: Secondary | ICD-10-CM | POA: Insufficient documentation

## 2024-02-12 DIAGNOSIS — Z881 Allergy status to other antibiotic agents status: Secondary | ICD-10-CM

## 2024-02-12 DIAGNOSIS — Z952 Presence of prosthetic heart valve: Secondary | ICD-10-CM

## 2024-02-12 DIAGNOSIS — Z888 Allergy status to other drugs, medicaments and biological substances status: Secondary | ICD-10-CM

## 2024-02-12 DIAGNOSIS — I110 Hypertensive heart disease with heart failure: Secondary | ICD-10-CM | POA: Diagnosis present

## 2024-02-12 DIAGNOSIS — D509 Iron deficiency anemia, unspecified: Secondary | ICD-10-CM | POA: Diagnosis present

## 2024-02-12 DIAGNOSIS — E119 Type 2 diabetes mellitus without complications: Secondary | ICD-10-CM

## 2024-02-12 DIAGNOSIS — Z8673 Personal history of transient ischemic attack (TIA), and cerebral infarction without residual deficits: Secondary | ICD-10-CM

## 2024-02-12 DIAGNOSIS — J811 Chronic pulmonary edema: Secondary | ICD-10-CM

## 2024-02-12 DIAGNOSIS — A419 Sepsis, unspecified organism: Secondary | ICD-10-CM

## 2024-02-12 DIAGNOSIS — R000 Tachycardia, unspecified: Secondary | ICD-10-CM | POA: Diagnosis not present

## 2024-02-12 DIAGNOSIS — R32 Unspecified urinary incontinence: Secondary | ICD-10-CM | POA: Diagnosis present

## 2024-02-12 DIAGNOSIS — R0689 Other abnormalities of breathing: Secondary | ICD-10-CM | POA: Diagnosis not present

## 2024-02-12 DIAGNOSIS — Z823 Family history of stroke: Secondary | ICD-10-CM

## 2024-02-12 DIAGNOSIS — R7989 Other specified abnormal findings of blood chemistry: Secondary | ICD-10-CM

## 2024-02-12 DIAGNOSIS — Z8719 Personal history of other diseases of the digestive system: Secondary | ICD-10-CM

## 2024-02-12 DIAGNOSIS — Z90710 Acquired absence of both cervix and uterus: Secondary | ICD-10-CM

## 2024-02-12 DIAGNOSIS — Z7984 Long term (current) use of oral hypoglycemic drugs: Secondary | ICD-10-CM

## 2024-02-12 DIAGNOSIS — Z7901 Long term (current) use of anticoagulants: Secondary | ICD-10-CM

## 2024-02-12 DIAGNOSIS — Z96651 Presence of right artificial knee joint: Secondary | ICD-10-CM | POA: Diagnosis present

## 2024-02-12 DIAGNOSIS — Z8249 Family history of ischemic heart disease and other diseases of the circulatory system: Secondary | ICD-10-CM

## 2024-02-12 DIAGNOSIS — K219 Gastro-esophageal reflux disease without esophagitis: Secondary | ICD-10-CM | POA: Diagnosis present

## 2024-02-12 DIAGNOSIS — D72819 Decreased white blood cell count, unspecified: Secondary | ICD-10-CM | POA: Diagnosis not present

## 2024-02-12 DIAGNOSIS — J189 Pneumonia, unspecified organism: Secondary | ICD-10-CM | POA: Diagnosis present

## 2024-02-12 DIAGNOSIS — T451X5A Adverse effect of antineoplastic and immunosuppressive drugs, initial encounter: Secondary | ICD-10-CM | POA: Diagnosis present

## 2024-02-12 DIAGNOSIS — Z953 Presence of xenogenic heart valve: Secondary | ICD-10-CM

## 2024-02-12 DIAGNOSIS — J9600 Acute respiratory failure, unspecified whether with hypoxia or hypercapnia: Secondary | ICD-10-CM | POA: Diagnosis not present

## 2024-02-12 DIAGNOSIS — E039 Hypothyroidism, unspecified: Secondary | ICD-10-CM | POA: Diagnosis present

## 2024-02-12 DIAGNOSIS — I480 Paroxysmal atrial fibrillation: Secondary | ICD-10-CM | POA: Diagnosis present

## 2024-02-12 DIAGNOSIS — Z9221 Personal history of antineoplastic chemotherapy: Secondary | ICD-10-CM

## 2024-02-12 DIAGNOSIS — I499 Cardiac arrhythmia, unspecified: Secondary | ICD-10-CM | POA: Diagnosis not present

## 2024-02-12 LAB — COMPREHENSIVE METABOLIC PANEL WITH GFR
ALT: 21 U/L (ref 0–44)
AST: 29 U/L (ref 15–41)
Albumin: 3.1 g/dL — ABNORMAL LOW (ref 3.5–5.0)
Alkaline Phosphatase: 73 U/L (ref 38–126)
Anion gap: 10 (ref 5–15)
BUN: 9 mg/dL (ref 8–23)
CO2: 22 mmol/L (ref 22–32)
Calcium: 9.5 mg/dL (ref 8.9–10.3)
Chloride: 102 mmol/L (ref 98–111)
Creatinine, Ser: 0.78 mg/dL (ref 0.44–1.00)
GFR, Estimated: >60 mL/min (ref >60)
Glucose, Bld: 260 mg/dL — ABNORMAL HIGH (ref 70–99)
Potassium: 4.2 mmol/L (ref 3.5–5.1)
Sodium: 134 mmol/L — ABNORMAL LOW (ref 135–145)
Total Bilirubin: 0.5 mg/dL (ref 0.0–1.2)
Total Protein: 8.1 g/dL (ref 6.5–8.1)

## 2024-02-12 LAB — CBC WITH DIFFERENTIAL (CANCER CENTER ONLY)
Abs Immature Granulocytes: 0.01 10*3/uL (ref 0.00–0.07)
Basophils Absolute: 0 10*3/uL (ref 0.0–0.1)
Basophils Relative: 1 %
Eosinophils Absolute: 0 10*3/uL (ref 0.0–0.5)
Eosinophils Relative: 1 %
HCT: 27.9 % — ABNORMAL LOW (ref 36.0–46.0)
Hemoglobin: 8.9 g/dL — ABNORMAL LOW (ref 12.0–15.0)
Immature Granulocytes: 0 %
Lymphocytes Relative: 39 %
Lymphs Abs: 2 10*3/uL (ref 0.7–4.0)
MCH: 24.5 pg — ABNORMAL LOW (ref 26.0–34.0)
MCHC: 31.9 g/dL (ref 30.0–36.0)
MCV: 76.6 fL — ABNORMAL LOW (ref 80.0–100.0)
Monocytes Absolute: 0.4 10*3/uL (ref 0.1–1.0)
Monocytes Relative: 8 %
Neutro Abs: 2.6 10*3/uL (ref 1.7–7.7)
Neutrophils Relative %: 51 %
Platelet Count: 229 10*3/uL (ref 150–400)
RBC: 3.64 MIL/uL — ABNORMAL LOW (ref 3.87–5.11)
RDW: 18.6 % — ABNORMAL HIGH (ref 11.5–15.5)
WBC Count: 5.1 10*3/uL (ref 4.0–10.5)
nRBC: 0 % (ref 0.0–0.2)

## 2024-02-12 LAB — CMP (CANCER CENTER ONLY)
ALT: 13 U/L (ref 0–44)
AST: 15 U/L (ref 15–41)
Albumin: 3.3 g/dL — ABNORMAL LOW (ref 3.5–5.0)
Alkaline Phosphatase: 56 U/L (ref 38–126)
Anion gap: 5 (ref 5–15)
BUN: 11 mg/dL (ref 8–23)
CO2: 32 mmol/L (ref 22–32)
Calcium: 9.4 mg/dL (ref 8.9–10.3)
Chloride: 100 mmol/L (ref 98–111)
Creatinine: 0.58 mg/dL (ref 0.44–1.00)
GFR, Estimated: >60 mL/min (ref >60)
Glucose, Bld: 141 mg/dL — ABNORMAL HIGH (ref 70–99)
Potassium: 4 mmol/L (ref 3.5–5.1)
Sodium: 137 mmol/L (ref 135–145)
Total Bilirubin: 0.3 mg/dL (ref 0.0–1.2)
Total Protein: 6.8 g/dL (ref 6.5–8.1)

## 2024-02-12 LAB — CBC WITH DIFFERENTIAL/PLATELET
Abs Immature Granulocytes: 0.03 10*3/uL (ref 0.00–0.07)
Basophils Absolute: 0 10*3/uL (ref 0.0–0.1)
Basophils Relative: 0 %
Eosinophils Absolute: 0 10*3/uL (ref 0.0–0.5)
Eosinophils Relative: 0 %
HCT: 37.1 % (ref 36.0–46.0)
Hemoglobin: 11.1 g/dL — ABNORMAL LOW (ref 12.0–15.0)
Immature Granulocytes: 0 %
Lymphocytes Relative: 33 %
Lymphs Abs: 4 10*3/uL (ref 0.7–4.0)
MCH: 24.4 pg — ABNORMAL LOW (ref 26.0–34.0)
MCHC: 29.9 g/dL — ABNORMAL LOW (ref 30.0–36.0)
MCV: 81.5 fL (ref 80.0–100.0)
Monocytes Absolute: 0.5 10*3/uL (ref 0.1–1.0)
Monocytes Relative: 4 %
Neutro Abs: 7.5 10*3/uL (ref 1.7–7.7)
Neutrophils Relative %: 63 %
Platelets: 279 10*3/uL (ref 150–400)
RBC: 4.55 MIL/uL (ref 3.87–5.11)
RDW: 18.8 % — ABNORMAL HIGH (ref 11.5–15.5)
WBC: 12 10*3/uL — ABNORMAL HIGH (ref 4.0–10.5)
nRBC: 0 % (ref 0.0–0.2)

## 2024-02-12 LAB — I-STAT VENOUS BLOOD GAS, ED
Acid-base deficit: 3 mmol/L — ABNORMAL HIGH (ref 0.0–2.0)
Bicarbonate: 24.3 mmol/L (ref 20.0–28.0)
Calcium, Ion: 1.25 mmol/L (ref 1.15–1.40)
HCT: 37 % (ref 36.0–46.0)
Hemoglobin: 12.6 g/dL (ref 12.0–15.0)
O2 Saturation: 99 %
Potassium: 4.4 mmol/L (ref 3.5–5.1)
Sodium: 136 mmol/L (ref 135–145)
TCO2: 26 mmol/L (ref 22–32)
pCO2, Ven: 54.6 mmHg (ref 44–60)
pH, Ven: 7.257 (ref 7.25–7.43)
pO2, Ven: 170 mmHg — ABNORMAL HIGH (ref 32–45)

## 2024-02-12 LAB — BRAIN NATRIURETIC PEPTIDE: B Natriuretic Peptide: 815.7 pg/mL — ABNORMAL HIGH (ref 0.0–100.0)

## 2024-02-12 LAB — TROPONIN I (HIGH SENSITIVITY): Troponin I (High Sensitivity): 32 ng/L — ABNORMAL HIGH (ref <18)

## 2024-02-12 LAB — MAGNESIUM: Magnesium: 1.5 mg/dL — ABNORMAL LOW (ref 1.7–2.4)

## 2024-02-12 MED ORDER — PROCHLORPERAZINE MALEATE 10 MG PO TABS
10.0000 mg | ORAL_TABLET | Freq: Once | ORAL | Status: AC
  Administered 2024-02-12: 10 mg via ORAL
  Filled 2024-02-12: qty 1

## 2024-02-12 MED ORDER — SODIUM CHLORIDE 0.9 % IV SOLN
1000.0000 mg/m2 | Freq: Once | INTRAVENOUS | Status: AC
  Administered 2024-02-12: 1824 mg via INTRAVENOUS
  Filled 2024-02-12: qty 47.97

## 2024-02-12 MED ORDER — VANCOMYCIN HCL IN DEXTROSE 1-5 GM/200ML-% IV SOLN
1000.0000 mg | Freq: Once | INTRAVENOUS | Status: DC
Start: 2024-02-12 — End: 2024-02-12

## 2024-02-12 MED ORDER — SODIUM CHLORIDE 0.9% FLUSH
10.0000 mL | Freq: Once | INTRAVENOUS | Status: AC
  Administered 2024-02-12: 10 mL

## 2024-02-12 MED ORDER — CHLORHEXIDINE GLUCONATE CLOTH 2 % EX PADS
6.0000 | MEDICATED_PAD | Freq: Every day | CUTANEOUS | Status: DC
  Administered 2024-02-13 – 2024-02-19 (×7): 6 via TOPICAL

## 2024-02-12 MED ORDER — SODIUM CHLORIDE 0.9 % IV SOLN: 2.0000 g | Freq: Once | INTRAVENOUS | Status: DC

## 2024-02-12 MED ORDER — VANCOMYCIN HCL 1500 MG/300ML IV SOLN
1500.0000 mg | Freq: Once | INTRAVENOUS | Status: DC
  Filled 2024-02-12: qty 300

## 2024-02-12 MED ORDER — MAGNESIUM SULFATE 2 GM/50ML IV SOLN
2.0000 g | Freq: Once | INTRAVENOUS | Status: AC
  Administered 2024-02-12: 2 g via INTRAVENOUS
  Filled 2024-02-12: qty 50

## 2024-02-12 MED ORDER — NITROGLYCERIN IN D5W 200-5 MCG/ML-% IV SOLN
0.0000 ug/min | INTRAVENOUS | Status: DC
  Administered 2024-02-12: 100 ug/min via INTRAVENOUS

## 2024-02-12 MED ORDER — SODIUM CHLORIDE 0.9% FLUSH: 10.0000 mL | INTRAVENOUS | Status: DC | PRN

## 2024-02-12 MED ORDER — FUROSEMIDE 10 MG/ML IJ SOLN
20.0000 mg | Freq: Once | INTRAMUSCULAR | Status: AC
  Administered 2024-02-12: 20 mg via INTRAVENOUS

## 2024-02-12 MED ORDER — SODIUM CHLORIDE 0.9 % IV SOLN: INTRAVENOUS | Status: DC

## 2024-02-12 MED ORDER — HEPARIN SOD (PORK) LOCK FLUSH 100 UNIT/ML IV SOLN
500.0000 [IU] | Freq: Once | INTRAVENOUS | Status: AC | PRN
  Administered 2024-02-12: 500 [IU]

## 2024-02-12 MED ORDER — SODIUM CHLORIDE 0.9% FLUSH
10.0000 mL | INTRAVENOUS | Status: DC | PRN
  Administered 2024-02-12: 10 mL

## 2024-02-12 NOTE — Patient Instructions (Signed)

## 2024-02-12 NOTE — Progress Notes (Signed)
 Nutrition Follow-up:  Patient with pancreatic cancer. She is currently receiving gemcitabine  + abraxane  (start 2/5; abraxane  added 3/5).    Met with patient in infusion. She reports doing well today. Her appetite is good. Drinking one glucerna. Patient is taking one capsule (36,000 units) of Creon  at meal times. Says her bowels are moving much better. Patient denies nausea, vomiting.    Medications: reviewed   Labs: glucose 141, albumin 3.3  Anthropometrics: Wt 154 lb 4 oz today - increased   4/30 - 151 lb 3 oz 4/7 - 154 lb 12.8 oz 3/19 - 155 lb 14.4 oz    NUTRITION DIAGNOSIS: Unintended wt loss improved    INTERVENTION:  Encourage high calorie high protein foods for wt maintenance Continue 36000 units creon  at meals, educated to increase to 72000 with high fat meal Continue daily glucerna - coupons provided     MONITORING, EVALUATION, GOAL: wt trends, intake   NEXT VISIT: To be scheduled as needed

## 2024-02-12 NOTE — Progress Notes (Signed)
 Patient weaned off of BiPAP.  Patient tolerating well.  BiPAP on standby if needed.

## 2024-02-12 NOTE — ED Triage Notes (Signed)
 Pt BIB EMS from home. CC SHOB few hrs ago. 88% RA initally, declined and 85% on cpap.    No hx chf or copd. Recently at [facility] for new Afib r/t low mag. Hz pancreatic cancer   EMS VS 180/110, HR 130-180 Afib, LB with runs of Vtach 10-12 at most per EMS.

## 2024-02-12 NOTE — ED Provider Notes (Signed)
 [name] EMERGENCY DEPARTMENT AT [facility] Provider Note   CSN: [id number] Arrival date & time: 02/12/24  2126     History  Chief Complaint  Patient presents with   Respiratory Distress    Janet Williamson is a 83 y.o. female.  With a history of pancreatic cancer, atrial fibrillation, status post TAVR, diabetes and stroke who presents to the ED for shortness of breath.  Acute onset shortness of breath at home prior to arrival.  EMS was called for respiratory distress and noted the patient to be hypoxic on room air in the 80s with improvement on CPAP prior to arrival.  She was also hypertensive.  Last chemotherapy yesterday.  No chest pain, fevers, chills nausea vomiting or diaphoresis  HPI     Home Medications Prior to Admission medications   Medication Sig Start Date End Date Taking? Authorizing Provider  acetaminophen  (TYLENOL ) 325 MG tablet Take 325-650 mg by mouth every 8 (eight) hours as needed (for headaches).    [provider]  amLODipine  (NORVASC ) 5 MG tablet Take 5 mg by mouth daily.    [provider]  Blood Glucose Monitoring Suppl (ACCU-CHEK GUIDE ME) w/Device KIT by Does not apply route.    [provider]  Blood Glucose Monitoring Suppl (ONE TOUCH ULTRA 2) w/Device KIT Use to check blood sugars three times daily DX: E11.9 08/23/20   Lajean Pike, MD  Cholecalciferol  (VITAMIN D3) 5000 units CAPS Take 5,000 Units by mouth daily.    [provider]  hydrochlorothiazide  (HYDRODIURIL ) 25 MG tablet Take 25 mg by mouth daily.     [provider]  Lancets (ONETOUCH DELICA PLUS LANCET33G) MISC TEST BLOOD SUGAR THREE TIMES DAILY 08/16/22   Lajean Pike, MD  lidocaine -prilocaine  (EMLA ) cream Apply 1 Application topically as needed. Apply a quarter size amount of cream to port-a-cath site 45 mins to 1 hr prior to port-a-cath being access. Patient taking differently: Apply 1 Application topically as needed (as directed- apply a  quarter-size amount of cream to port-a-cath site 45 mins to 1 hr prior to port-a-cath being access). 01/15/24   Sonja [place], MD  lipase/protease/amylase (CREON ) 36000 UNITS CPEP capsule Take 2 capsules (72,000 Units total) by mouth 3 (three) times daily with meals. May also take 1 capsule (36,000 Units total) as needed (with snacks - up to 4 snacks daily). Patient taking differently: Take 1-2 capsules (36,000-72,000 units) by mouth 3 (three) times daily with meals. May also take 1 capsule (36,000 Units total) as needed (with snacks - up to 4 snacks daily). 01/16/24   Sonja [place], MD  loperamide  (IMODIUM ) 2 MG capsule Take 1 capsule (2 mg total) by mouth as needed for diarrhea or loose stools. 11/12/23   Burton, Lacie K, NP  metFORMIN  (GLUCOPHAGE -XR) 750 MG 24 hr tablet TAKE 2 TABLETS ONE TIME DAILY WITH BREAKFAST 11/27/23   Thapa, [place], MD  metoprolol  succinate (TOPROL -XL) 100 MG 24 hr tablet Take 100 mg by mouth daily. Take with or immediately following a meal.    [provider]  metoprolol  tartrate (LOPRESSOR ) 25 MG tablet Take 1 tablet (25 mg total) by mouth daily as needed (if heart rate > 120). 02/09/24   Dalene Duck, MD  ondansetron  (ZOFRAN ) 8 MG tablet Take 1 tablet (8 mg total) by mouth every 8 (eight) hours as needed for nausea or vomiting. 11/12/23   Burton, Lacie K, NP  ONETOUCH ULTRA TEST test strip TEST BLOOD SUGAR THREE TIMES DAILY 04/12/23  Lajean Pike, MD  oxyCODONE  (OXY IR/ROXICODONE ) 5 MG immediate release tablet Take 1 tablet (5 mg total) by mouth every 8 (eight) hours as needed for severe pain (pain score 7-10). Patient taking differently: Take 2.5-5 mg by mouth every 8 (eight) hours as needed for severe pain (pain score 7-10). 02/05/24   Sonja [place], MD  pantoprazole  (PROTONIX ) 20 MG tablet Take 1 tablet (20 mg total) by mouth daily. Patient taking differently: Take 20 mg by mouth daily as needed for heartburn or indigestion. 02/05/24   Sonja [place], MD  Polyethyl Glycol-Propyl Glycol  (SYSTANE OP) Place 1 drop into both eyes 2 (two) times daily.     [provider]  potassium chloride  SA (KLOR-CON  M) 20 MEQ tablet Take 1 tablet (20 mEq total) by mouth 2 (two) times daily. Patient taking differently: Take 20 mEq by mouth daily. 11/12/23   Burton, Lacie K, NP  prochlorperazine  (COMPAZINE ) 10 MG tablet Take 1 tablet (10 mg total) by mouth every 6 (six) hours as needed for nausea or vomiting. Patient not taking: Reported on 02/09/2024 11/12/23   Burton, Lacie K, NP  rivaroxaban  (XARELTO ) 20 MG TABS tablet Take 1 tablet (20 mg total) by mouth daily with supper. 02/09/24   Dalene Duck, MD  simvastatin  (ZOCOR ) 20 MG tablet Take 20 mg by mouth at bedtime.    [provider]      Allergies    Ace inhibitors, Keflex [cephalexin], and Rifadin [rifampin]    Review of Systems   Review of Systems  Physical Exam Updated Vital Signs BP 133/62   Pulse (!) 105   Temp 97.6 F (36.4 C) (Axillary)   Resp (!) 26   SpO2 98%  Physical Exam Vitals and nursing note reviewed.  HENT:     Head: Normocephalic and atraumatic.  Eyes:     Pupils: Pupils are equal, round, and reactive to light.  Cardiovascular:     Rate and Rhythm: Normal rate and regular rhythm.  Pulmonary:     Effort: Respiratory distress present.     Breath sounds: Rales present.  Abdominal:     Palpations: Abdomen is soft.     Tenderness: There is no abdominal tenderness.  Skin:    General: Skin is warm and dry.  Neurological:     Mental Status: She is alert.  Psychiatric:        Mood and Affect: Mood normal.     ED Results / Procedures / Treatments   Labs (all labs ordered are listed, but only abnormal results are displayed) Labs Reviewed  COMPREHENSIVE METABOLIC PANEL WITH GFR - Abnormal; Notable for the following components:      Result Value   Sodium 134 (*)    Glucose, Bld 260 (*)    Albumin 3.1 (*)    All other components within normal limits  CBC WITH DIFFERENTIAL/PLATELET -  Abnormal; Notable for the following components:   WBC 12.0 (*)    Hemoglobin 11.1 (*)    MCH 24.4 (*)    MCHC 29.9 (*)    RDW 18.8 (*)    All other components within normal limits  MAGNESIUM  - Abnormal; Notable for the following components:   Magnesium  1.5 (*)    All other components within normal limits  BRAIN NATRIURETIC PEPTIDE - Abnormal; Notable for the following components:   B Natriuretic Peptide 815.7 (*)    All other components within normal limits  I-STAT VENOUS BLOOD GAS, ED - Abnormal; Notable for the following components:  pO2, Ven 170 (*)    Acid-base deficit 3.0 (*)    All other components within normal limits  TROPONIN I (HIGH SENSITIVITY) - Abnormal; Notable for the following components:   Troponin I (High Sensitivity) 32 (*)    All other components within normal limits  CULTURE, BLOOD (ROUTINE X 2)  CULTURE, BLOOD (ROUTINE X 2)  TROPONIN I (HIGH SENSITIVITY)    EKG None  Radiology DG Chest Portable 1 View Result Date: 02/12/2024 CLINICAL DATA:  Shortness of breath EXAM: PORTABLE CHEST 1 VIEW COMPARISON:  02/09/2024 FINDINGS: Check shadow is stable. Changes of prior TAVR are noted. Right chest wall port is again seen. Aortic calcifications are noted. The lungs are well aerated bilaterally. Diffuse airspace opacity is noted right greater than left consistent with diffuse edema given the abrupt onset. No sizable effusion is seen. No bony abnormality is noted. IMPRESSION: Diffuse airspace opacities bilaterally likely related to pulmonary edema. Electronically Signed   By: Violeta Grey M.D.   On: 02/12/2024 22:17    Procedures Ultrasound ED Thoracic  Date/Time: 02/12/2024 11:46 PM  Performed by: Sallyanne Creamer, DO Authorized by: Sallyanne Creamer, DO   Procedure details:    Indications: dyspnea     Assessment for:  Hemothorax, pleural effusion, interstitial syndrome and pneumothorax   Left lung pleural:  Visualized   Right lung pleural:  Visualized   Images:  archived   Findings:    A-lines noted throughout: not identified     B-lines noted throughout: identified   Right Lung Findings:     right lung sliding Left Lung Findings:     left lung sliding Impression:    Impression: interstitial syndrome and pulmonary edema   Comments:     Diffuse B-lines .Critical Care  Performed by: Sallyanne Creamer, DO Authorized by: Sallyanne Creamer, DO   Critical care provider statement:    Critical care time (minutes):  80   Critical care time was exclusive of:  Separately billable procedures and treating other patients and teaching time   Critical care was necessary to treat or prevent imminent or life-threatening deterioration of the following conditions:  Respiratory failure   Critical care was time spent personally by me on the following activities:  Development of treatment plan with patient or surrogate, discussions with consultants, evaluation of patient's response to treatment, examination of patient, ordering and review of laboratory studies, ordering and review of radiographic studies, ordering and performing treatments and interventions, pulse oximetry, re-evaluation of patient's condition, review of old charts and obtaining history from patient or surrogate   I assumed direction of critical care for this patient from another provider in my specialty: no     Care discussed with: admitting provider   Comments:     Discussed admitting hospitalist     Medications Ordered in ED Medications  nitroGLYCERIN  50 mg in dextrose  5 % 250 mL (0.2 mg/mL) infusion (0 mcg/min Intravenous Stopped 02/12/24 2150)  magnesium  sulfate IVPB 2 g 50 mL (2 g Intravenous New Bag/Given 02/12/24 2258)  aztreonam (AZACTAM) 2 g in sodium chloride  0.9 % 100 mL IVPB (has no administration in time range)  vancomycin  (VANCOREADY) IVPB 1500 mg/300 mL (has no administration in time range)  sodium chloride  flush (NS) 0.9 % injection 10-40 mL (has no administration in time range)   Chlorhexidine  Gluconate Cloth 2 % PADS 6 each (has no administration in time range)  furosemide (LASIX) injection 20 mg (20 mg Intravenous Given 02/12/24 2147)  ED Course/ Medical Decision Making/ A&P Clinical Course as of 02/12/24 2348  Wed Feb 12, 2024  2347 Patient's respiratory status has improved.  Will trial her off of BiPAP on O2 nasal cannula BNP elevated.  Initial Trope elevated will obtain delta but still no active chest pain.  Hypomagnesemia will replete.  Discussed with admitting hospitalist accepts patient for admission [MP]    Clinical Course User Index [MP] Sallyanne Creamer, DO                                 Medical Decision Making 44 old female with history as above presenting in acute respiratory distress.  Some improvement in respiratory effort on BiPAP.  Initially hypertensive with systolic blood pressure in the 180s.  Bedside ultrasound notable for diffuse B-lines consistent with flash pulmonary edema.  We initiated nitro drip and left this on for a couple minutes as her pressure tolerated.  Gave 1 dose of IV Lasix and will continue on BiPAP.  Will obtain chest x-ray and laboratory workup.  Initial EKG showed normal sinus rhythm with no ischemic changes.  Will cover for hospital-acquired pneumonia as well.  Amount and/or Complexity of Data Reviewed Labs: ordered. Radiology: ordered.  Risk OTC drugs. Prescription drug management. Decision regarding hospitalization.           Final Clinical Impression(s) / ED Diagnoses Final diagnoses:  Acute pulmonary edema (HCC)  Acute respiratory failure with hypoxia [facility])    Rx / DC Orders ED Discharge Orders     None         Sallyanne Creamer, DO 02/12/24 2348

## 2024-02-13 ENCOUNTER — Inpatient Hospital Stay (HOSPITAL_COMMUNITY)

## 2024-02-13 DIAGNOSIS — I480 Paroxysmal atrial fibrillation: Secondary | ICD-10-CM | POA: Diagnosis present

## 2024-02-13 DIAGNOSIS — I2489 Other forms of acute ischemic heart disease: Secondary | ICD-10-CM | POA: Diagnosis present

## 2024-02-13 DIAGNOSIS — I350 Nonrheumatic aortic (valve) stenosis: Secondary | ICD-10-CM

## 2024-02-13 DIAGNOSIS — I5A Non-ischemic myocardial injury (non-traumatic): Secondary | ICD-10-CM | POA: Diagnosis not present

## 2024-02-13 DIAGNOSIS — I38 Endocarditis, valve unspecified: Secondary | ICD-10-CM | POA: Diagnosis not present

## 2024-02-13 DIAGNOSIS — Z952 Presence of prosthetic heart valve: Secondary | ICD-10-CM | POA: Diagnosis not present

## 2024-02-13 DIAGNOSIS — R531 Weakness: Secondary | ICD-10-CM | POA: Diagnosis not present

## 2024-02-13 DIAGNOSIS — Z515 Encounter for palliative care: Secondary | ICD-10-CM | POA: Diagnosis not present

## 2024-02-13 DIAGNOSIS — Z66 Do not resuscitate: Secondary | ICD-10-CM | POA: Diagnosis present

## 2024-02-13 DIAGNOSIS — Z1152 Encounter for screening for COVID-19: Secondary | ICD-10-CM | POA: Diagnosis not present

## 2024-02-13 DIAGNOSIS — J9601 Acute respiratory failure with hypoxia: Secondary | ICD-10-CM | POA: Diagnosis present

## 2024-02-13 DIAGNOSIS — J811 Chronic pulmonary edema: Secondary | ICD-10-CM

## 2024-02-13 DIAGNOSIS — R7989 Other specified abnormal findings of blood chemistry: Secondary | ICD-10-CM | POA: Diagnosis not present

## 2024-02-13 DIAGNOSIS — I110 Hypertensive heart disease with heart failure: Secondary | ICD-10-CM | POA: Diagnosis present

## 2024-02-13 DIAGNOSIS — C250 Malignant neoplasm of head of pancreas: Secondary | ICD-10-CM | POA: Diagnosis present

## 2024-02-13 DIAGNOSIS — I358 Other nonrheumatic aortic valve disorders: Secondary | ICD-10-CM | POA: Diagnosis present

## 2024-02-13 DIAGNOSIS — I5031 Acute diastolic (congestive) heart failure: Secondary | ICD-10-CM | POA: Diagnosis not present

## 2024-02-13 DIAGNOSIS — I5033 Acute on chronic diastolic (congestive) heart failure: Secondary | ICD-10-CM

## 2024-02-13 DIAGNOSIS — D6481 Anemia due to antineoplastic chemotherapy: Secondary | ICD-10-CM | POA: Diagnosis present

## 2024-02-13 DIAGNOSIS — D630 Anemia in neoplastic disease: Secondary | ICD-10-CM | POA: Diagnosis present

## 2024-02-13 DIAGNOSIS — T826XXA Infection and inflammatory reaction due to cardiac valve prosthesis, initial encounter: Secondary | ICD-10-CM | POA: Diagnosis present

## 2024-02-13 DIAGNOSIS — T82857A Stenosis of cardiac prosthetic devices, implants and grafts, initial encounter: Secondary | ICD-10-CM | POA: Diagnosis present

## 2024-02-13 DIAGNOSIS — Y831 Surgical operation with implant of artificial internal device as the cause of abnormal reaction of the patient, or of later complication, without mention of misadventure at the time of the procedure: Secondary | ICD-10-CM | POA: Diagnosis present

## 2024-02-13 DIAGNOSIS — K219 Gastro-esophageal reflux disease without esophagitis: Secondary | ICD-10-CM | POA: Diagnosis present

## 2024-02-13 DIAGNOSIS — A419 Sepsis, unspecified organism: Secondary | ICD-10-CM | POA: Diagnosis present

## 2024-02-13 DIAGNOSIS — I339 Acute and subacute endocarditis, unspecified: Secondary | ICD-10-CM | POA: Diagnosis not present

## 2024-02-13 DIAGNOSIS — J810 Acute pulmonary edema: Secondary | ICD-10-CM

## 2024-02-13 DIAGNOSIS — D72819 Decreased white blood cell count, unspecified: Secondary | ICD-10-CM | POA: Diagnosis not present

## 2024-02-13 DIAGNOSIS — E1151 Type 2 diabetes mellitus with diabetic peripheral angiopathy without gangrene: Secondary | ICD-10-CM | POA: Diagnosis present

## 2024-02-13 DIAGNOSIS — K769 Liver disease, unspecified: Secondary | ICD-10-CM | POA: Diagnosis present

## 2024-02-13 DIAGNOSIS — E039 Hypothyroidism, unspecified: Secondary | ICD-10-CM | POA: Diagnosis present

## 2024-02-13 DIAGNOSIS — I10 Essential (primary) hypertension: Secondary | ICD-10-CM

## 2024-02-13 DIAGNOSIS — E1165 Type 2 diabetes mellitus with hyperglycemia: Secondary | ICD-10-CM | POA: Diagnosis present

## 2024-02-13 DIAGNOSIS — J189 Pneumonia, unspecified organism: Secondary | ICD-10-CM | POA: Diagnosis present

## 2024-02-13 DIAGNOSIS — D509 Iron deficiency anemia, unspecified: Secondary | ICD-10-CM | POA: Diagnosis present

## 2024-02-13 LAB — ECHOCARDIOGRAM COMPLETE
AR max vel: 1.03 cm2
AV Area VTI: 1.07 cm2
AV Area mean vel: 1.06 cm2
AV Mean grad: 55 mmHg
AV Peak grad: 97.6 mmHg
Ao pk vel: 4.94 m/s
Area-P 1/2: 3.1 cm2
Calc EF: 51.2 %
MV M vel: 6.71 m/s
MV Peak grad: 180.1 mmHg
MV VTI: 2.62 cm2
P 1/2 time: 409 ms
Radius: 0.7 cm
S' Lateral: 2.6 cm
Single Plane A2C EF: 48.9 %
Single Plane A4C EF: 54.9 %
Weight: 2468 [oz_av]

## 2024-02-13 LAB — I-STAT VENOUS BLOOD GAS, ED
Acid-Base Excess: 5 mmol/L — ABNORMAL HIGH (ref 0.0–2.0)
Bicarbonate: 30.7 mmol/L — ABNORMAL HIGH (ref 20.0–28.0)
Calcium, Ion: 1.23 mmol/L (ref 1.15–1.40)
HCT: 29 % — ABNORMAL LOW (ref 36.0–46.0)
Hemoglobin: 9.9 g/dL — ABNORMAL LOW (ref 12.0–15.0)
O2 Saturation: 99 %
Potassium: 3.8 mmol/L (ref 3.5–5.1)
Sodium: 136 mmol/L (ref 135–145)
TCO2: 32 mmol/L (ref 22–32)
pCO2, Ven: 47.7 mmHg (ref 44–60)
pH, Ven: 7.417 (ref 7.25–7.43)
pO2, Ven: 115 mmHg — ABNORMAL HIGH (ref 32–45)

## 2024-02-13 LAB — BASIC METABOLIC PANEL WITH GFR
Anion gap: 10 (ref 5–15)
BUN: 10 mg/dL (ref 8–23)
CO2: 26 mmol/L (ref 22–32)
Calcium: 9.2 mg/dL (ref 8.9–10.3)
Chloride: 99 mmol/L (ref 98–111)
Creatinine, Ser: 0.65 mg/dL (ref 0.44–1.00)
GFR, Estimated: >60 mL/min (ref >60)
Glucose, Bld: 100 mg/dL — ABNORMAL HIGH (ref 70–99)
Potassium: 3.8 mmol/L (ref 3.5–5.1)
Sodium: 135 mmol/L (ref 135–145)

## 2024-02-13 LAB — CBC
HCT: 29.6 % — ABNORMAL LOW (ref 36.0–46.0)
Hemoglobin: 9.3 g/dL — ABNORMAL LOW (ref 12.0–15.0)
MCH: 24.7 pg — ABNORMAL LOW (ref 26.0–34.0)
MCHC: 31.4 g/dL (ref 30.0–36.0)
MCV: 78.7 fL — ABNORMAL LOW (ref 80.0–100.0)
Platelets: 222 K/uL (ref 150–400)
RBC: 3.76 MIL/uL — ABNORMAL LOW (ref 3.87–5.11)
RDW: 18.5 % — ABNORMAL HIGH (ref 11.5–15.5)
WBC: 11 K/uL — ABNORMAL HIGH (ref 4.0–10.5)
nRBC: 0.2 % (ref 0.0–0.2)

## 2024-02-13 LAB — GLUCOSE, CAPILLARY
Glucose-Capillary: 111 mg/dL — ABNORMAL HIGH (ref 70–99)
Glucose-Capillary: 150 mg/dL — ABNORMAL HIGH (ref 70–99)
Glucose-Capillary: 158 mg/dL — ABNORMAL HIGH (ref 70–99)

## 2024-02-13 LAB — RESP PANEL BY RT-PCR (RSV, FLU A&B, COVID)  RVPGX2
Influenza A by PCR: NEGATIVE
Influenza B by PCR: NEGATIVE
Resp Syncytial Virus by PCR: NEGATIVE
SARS Coronavirus 2 by RT PCR: NEGATIVE

## 2024-02-13 LAB — TROPONIN I (HIGH SENSITIVITY)
Troponin I (High Sensitivity): 41 ng/L — ABNORMAL HIGH (ref <18)
Troponin I (High Sensitivity): 60 ng/L — ABNORMAL HIGH (ref <18)
Troponin I (High Sensitivity): 82 ng/L — ABNORMAL HIGH (ref <18)
Troponin I (High Sensitivity): 98 ng/L — ABNORMAL HIGH (ref <18)

## 2024-02-13 LAB — MAGNESIUM: Magnesium: 1.7 mg/dL (ref 1.7–2.4)

## 2024-02-13 LAB — PHOSPHORUS: Phosphorus: 3.6 mg/dL (ref 2.5–4.6)

## 2024-02-13 LAB — LACTIC ACID, PLASMA: Lactic Acid, Venous: 1.5 mmol/L (ref 0.5–1.9)

## 2024-02-13 LAB — MRSA NEXT GEN BY PCR, NASAL: MRSA by PCR Next Gen: NOT DETECTED

## 2024-02-13 LAB — PROCALCITONIN: Procalcitonin: 3.13 ng/mL

## 2024-02-13 LAB — CBG MONITORING, ED
Glucose-Capillary: 204 mg/dL — ABNORMAL HIGH (ref 70–99)
Glucose-Capillary: 98 mg/dL (ref 70–99)

## 2024-02-13 MED ORDER — INSULIN ASPART 100 UNIT/ML IJ SOLN
0.0000 [IU] | Freq: Every day | INTRAMUSCULAR | Status: DC
  Administered 2024-02-13: 2 [IU] via SUBCUTANEOUS
  Administered 2024-02-15: 3 [IU] via SUBCUTANEOUS

## 2024-02-13 MED ORDER — PANTOPRAZOLE SODIUM 20 MG PO TBEC
20.0000 mg | DELAYED_RELEASE_TABLET | Freq: Every day | ORAL | Status: DC
  Administered 2024-02-13 – 2024-02-19 (×7): 20 mg via ORAL
  Filled 2024-02-13 (×7): qty 1

## 2024-02-13 MED ORDER — METOPROLOL TARTRATE 50 MG PO TABS
50.0000 mg | ORAL_TABLET | Freq: Two times a day (BID) | ORAL | Status: DC
  Administered 2024-02-13 – 2024-02-14 (×5): 50 mg via ORAL
  Filled 2024-02-13: qty 2
  Filled 2024-02-13 (×3): qty 1
  Filled 2024-02-13: qty 2

## 2024-02-13 MED ORDER — ALBUTEROL SULFATE (2.5 MG/3ML) 0.083% IN NEBU
2.5000 mg | INHALATION_SOLUTION | RESPIRATORY_TRACT | Status: DC | PRN
  Administered 2024-02-14: 2.5 mg via RESPIRATORY_TRACT
  Filled 2024-02-13: qty 3

## 2024-02-13 MED ORDER — FUROSEMIDE 10 MG/ML IJ SOLN
20.0000 mg | Freq: Every day | INTRAMUSCULAR | Status: AC
Start: 2024-02-13 — End: ?
  Administered 2024-02-13 – 2024-02-15 (×3): 20 mg via INTRAVENOUS
  Filled 2024-02-13 (×3): qty 2

## 2024-02-13 MED ORDER — HYDROCHLOROTHIAZIDE 25 MG PO TABS: 25.0000 mg | ORAL_TABLET | Freq: Every day | ORAL | Status: DC

## 2024-02-13 MED ORDER — AZITHROMYCIN 250 MG PO TABS: 500.0000 mg | ORAL_TABLET | Freq: Every day | ORAL | Status: DC

## 2024-02-13 MED ORDER — SIMVASTATIN 20 MG PO TABS
20.0000 mg | ORAL_TABLET | Freq: Every day | ORAL | Status: DC
  Administered 2024-02-13 – 2024-02-18 (×7): 20 mg via ORAL
  Filled 2024-02-13 (×7): qty 1

## 2024-02-13 MED ORDER — SODIUM CHLORIDE 0.9% FLUSH
3.0000 mL | Freq: Two times a day (BID) | INTRAVENOUS | Status: DC
  Administered 2024-02-13 – 2024-02-19 (×14): 3 mL via INTRAVENOUS

## 2024-02-13 MED ORDER — AZITHROMYCIN 500 MG PO TABS
500.0000 mg | ORAL_TABLET | Freq: Every day | ORAL | Status: AC
  Administered 2024-02-14 – 2024-02-15 (×2): 500 mg via ORAL
  Filled 2024-02-13 (×2): qty 1

## 2024-02-13 MED ORDER — LEVOFLOXACIN IN D5W 750 MG/150ML IV SOLN
750.0000 mg | INTRAVENOUS | Status: DC
  Administered 2024-02-13: 750 mg via INTRAVENOUS
  Filled 2024-02-13: qty 150

## 2024-02-13 MED ORDER — DILTIAZEM HCL-DEXTROSE 125-5 MG/125ML-% IV SOLN (PREMIX)
5.0000 mg/h | INTRAVENOUS | Status: DC
  Administered 2024-02-13: 5 mg/h via INTRAVENOUS
  Filled 2024-02-13: qty 125

## 2024-02-13 MED ORDER — RIVAROXABAN 20 MG PO TABS
20.0000 mg | ORAL_TABLET | Freq: Every morning | ORAL | Status: DC
  Administered 2024-02-13 – 2024-02-19 (×7): 20 mg via ORAL
  Filled 2024-02-13: qty 2
  Filled 2024-02-13 (×6): qty 1

## 2024-02-13 MED ORDER — INSULIN ASPART 100 UNIT/ML IJ SOLN
0.0000 [IU] | Freq: Three times a day (TID) | INTRAMUSCULAR | Status: DC
  Administered 2024-02-14 – 2024-02-15 (×2): 1 [IU] via SUBCUTANEOUS
  Administered 2024-02-15 – 2024-02-16 (×2): 3 [IU] via SUBCUTANEOUS
  Administered 2024-02-17: 1 [IU] via SUBCUTANEOUS
  Administered 2024-02-18: 2 [IU] via SUBCUTANEOUS
  Administered 2024-02-18 – 2024-02-19 (×2): 1 [IU] via SUBCUTANEOUS

## 2024-02-13 MED ORDER — PANCRELIPASE (LIP-PROT-AMYL) 36000-114000 UNITS PO CPEP: 36000.0000 [IU] | ORAL_CAPSULE | ORAL | Status: DC | PRN

## 2024-02-13 MED ORDER — SODIUM CHLORIDE 0.9 % IV SOLN
2.0000 g | INTRAVENOUS | Status: DC
  Administered 2024-02-13 – 2024-02-19 (×7): 2 g via INTRAVENOUS
  Filled 2024-02-13 (×7): qty 20

## 2024-02-13 MED ORDER — DILTIAZEM LOAD VIA INFUSION
10.0000 mg | Freq: Once | INTRAVENOUS | Status: AC
  Administered 2024-02-13: 10 mg via INTRAVENOUS

## 2024-02-13 MED ORDER — OXYCODONE HCL 5 MG PO TABS: 5.0000 mg | ORAL_TABLET | Freq: Three times a day (TID) | ORAL | Status: DC | PRN

## 2024-02-13 MED ORDER — LEVOFLOXACIN IN D5W 750 MG/150ML IV SOLN: 750.0000 mg | INTRAVENOUS | Status: DC

## 2024-02-13 MED ORDER — AMLODIPINE BESYLATE 5 MG PO TABS
5.0000 mg | ORAL_TABLET | Freq: Every day | ORAL | Status: DC
  Administered 2024-02-13: 5 mg via ORAL
  Filled 2024-02-13: qty 1

## 2024-02-13 MED ORDER — PANCRELIPASE (LIP-PROT-AMYL) 36000-114000 UNITS PO CPEP
72000.0000 [IU] | ORAL_CAPSULE | Freq: Three times a day (TID) | ORAL | Status: DC
Start: 2024-02-13 — End: 2024-02-19
  Administered 2024-02-13 – 2024-02-19 (×18): 72000 [IU] via ORAL
  Filled 2024-02-13 (×22): qty 2

## 2024-02-13 NOTE — Assessment & Plan Note (Signed)
-   adjusting regimen for now

## 2024-02-13 NOTE — Hospital Course (Addendum)
 Janet Williamson is an 83 year old female with pancreatic adenocarcinoma on gemcitabine  with questionable mets currently being followed by oncology.  Also PMH of TAVR, PAF, CVA, diabetes, HTN, GI bleed who presented with dyspnea and two-pillow orthopnea.  She also endorsed some swelling in her legs after standing but states that they improve after laying in the bed flat. Over the weekend had developed some cough but denied any sputum production.  Her symptoms of shortness of breath and leg swelling had been going on for approximately 1 month. She did endorse some chest pain over the weekend as well.  She just received day 8 cycle 4 of gemcitabine  on 02/12/2024 (day 1 was on 4/30).    Notable labs included: Procalcitonin 3.13 WBC 12 on admission Troponin steadily uptrending (32 >>82>>98) BNP 815   CXR showed concern for diffuse airspace opacities possibly pulmonary edema.   Given her cough, leukocytosis, and procalcitonin, she was also started on antibiotics for pneumonia coverage.   Echo was also obtained which showed concern for moderate to severe MR.  Thickened TAVR valve with prosthetic valve stenosis; severe thickening of aortic valve.  Echo report states findings concerning for potential aortic valve vegetation and recommending to consider TEE.   Only 1 set of blood cultures was obtained on admission.  Requesting to see if additional set can be obtained on 02/13/2024 but has already had antibiotics.   Cardiology was consulted on admission as well due to elevated troponin, BNP.  Echo findings resulted after consultation request.  Clinically improved with diuresis and weaned off oxygen.  Echo showed at aortic valve severely affected and patient not felt to be treatment candidate for any intervention.  Palliative care was also consulted.  Due to concern for endocarditis, ID was also consulted and she is being treated for culture-negative endocarditis with daptomycin and Rocephin .

## 2024-02-13 NOTE — Consult Note (Addendum)
 Cardiology Consultation   Patient ID: YESLIN COLFER MRN: [id number]; DOB: 08-15-1941  Admit date: 02/12/2024 Date of Consult: 02/13/2024  PCP:  Benedetto Brady, MD   [facility] Providers Cardiologist:  Dorothye Gathers, MD        Patient Profile:   ANAHIT HAUSER is a 83 y.o. female with a hx of pancreatic adenocarcinoma (metastatic?) on chemotherapy, s/p biliary stent, paroxysmal atrial fibrillation, aortic valve dysfunction s/p TAVR 2019, prior stroke, DM, hypertension, GI bleeding who is being seen 02/13/2024 for the evaluation of elevated troponin in the setting of acute hypoxic respiratory failure at the request of Dr. Gladstone Lamer.  History of Present Illness:   Ms. Kearley presented from home with EMS on CPAP for management of acute dyspnea. EMS found patient hypoxic with O2 saturation in the 80s. Patient reporting symptoms of acute on chronic dyspnea, orthopnea. In the ED, patient placed on BiPAP due to ongoing increased WOB. Labs showed BNP 815, WBC 12. HS troponin 32->82->98. CXR with diffuse airspace opacities bilaterally, felt consistent with pulmonary edema. Patient given IV lasix 20mg  x1, also started on Levofloxacin  for CAP prophylaxis. Procalcitonin elevated to 3.13. Resp virus panel negative. This morning, patient received second dose of IV lasix 20mg  and cardiology consulted.   Patient seen in 5w today with 2 daughters and grandson also in the room. Patient reports age appropriate physical activity/tolerance including going grocery shopping, etc until about a week ago when she developed severe exertional dyspnea that made even walking across the room difficult. Did not have symptoms at rest until 5/4. Patient reports symptoms of palpitations consistent with afib over the past few days. Denies hematuria, blood in stool since resuming Xarelto .  Per chart review, patient with chronic dyspnea over the last week, was also seen in the ED on 5/4 with dyspnea and palpitations and  found to be in afib with RVR. ED notes from 5/4 report no history of afib but patient has previously had paroxysmal afib since 2018. She was initially on Xarelto  but this was discontinued in 2019 due to acute anemia (HGB 5.2) 2/2 GI bleeding and not resumed with ongoing assessment that risks>benefit. On 5/4 in the ED, patient treated with IV diltizem and had spontaneous conversion to NSR. She was started on Xarelto  by the ED and referred back to cardiology.    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 Not seen on most recent TEE 2019.   Cardiomyopathy in other disease    Cataracts, bilateral    Diabetes mellitus    Type 2   Generalized osteoarthritis    GERD (gastroesophageal reflux disease)    HLD (hyperlipidemia)    Hypertension    Hypothyroidism    LBBB (left bundle branch block)    PAF (paroxysmal atrial fibrillation) (HCC)    a. s/p DCCV 06/2017, on Xarelto    Phlebitis    S/P TAVR (transcatheter aortic valve replacement) 04/15/2018   Edwards Sapien 3 THV (size 23 mm, model # 9600TFX, serial # [id number]) via the TF approach   Severe aortic stenosis    a. s/p TAVR 04/2018.   Stroke [facility]) 2008    Past Surgical History:  Procedure Laterality Date   ABDOMINAL HYSTERECTOMY     BILIARY BRUSHING  10/16/2023   Procedure: BILIARY BRUSHING;  Surgeon: Evangeline Hilts, MD;  Location: [facility] ENDOSCOPY;  Service: Gastroenterology;;   BILIARY  STENT PLACEMENT N/A 10/16/2023   Procedure: BILIARY STENT PLACEMENT;  Surgeon: Evangeline Hilts, MD;  Location: [facility] ENDOSCOPY;  Service: Gastroenterology;  Laterality: N/A;   BIOPSY  05/19/2019   Procedure: BIOPSY;  Surgeon: Lanita Pitman, MD;  Location: [facility] ENDOSCOPY;  Service: Endoscopy;;   BREAST BIOPSY Left    years ago at "a doctor's office"   COLONOSCOPY     COLONOSCOPY WITH PROPOFOL  N/A 10/02/2018   Procedure: COLONOSCOPY WITH PROPOFOL ;  Surgeon: Evangeline Hilts, MD;  Location: [facility] ENDOSCOPY;  Service: Endoscopy;  Laterality: N/A;   ENTEROSCOPY N/A 05/19/2019   Procedure: ENTEROSCOPY;  Surgeon: Lanita Pitman, MD;  Location: [facility] ENDOSCOPY;  Service: Endoscopy;  Laterality: N/A;   ERCP N/A 10/16/2023   Procedure: ENDOSCOPIC RETROGRADE CHOLANGIOPANCREATOGRAPHY (ERCP);  Surgeon: Evangeline Hilts, MD;  Location: [name] ENDOSCOPY;  Service: Gastroenterology;  Laterality: N/A;   ESOPHAGOGASTRODUODENOSCOPY (EGD) WITH PROPOFOL  N/A 10/02/2018   Procedure: ESOPHAGOGASTRODUODENOSCOPY (EGD) WITH PROPOFOL ;  Surgeon: Evangeline Hilts, MD;  Location: [facility] ENDOSCOPY;  Service: Endoscopy;  Laterality: N/A;   ESOPHAGOGASTRODUODENOSCOPY (EGD) WITH PROPOFOL  N/A 04/23/2019   Procedure: ESOPHAGOGASTRODUODENOSCOPY (EGD) WITH PROPOFOL ;  Surgeon: Celedonio Coil, MD;  Location: [facility] ENDOSCOPY;  Service: Endoscopy;  Laterality: N/A;   ESOPHAGOGASTRODUODENOSCOPY (EGD) WITH PROPOFOL  N/A 10/16/2023   Procedure: ESOPHAGOGASTRODUODENOSCOPY (EGD) WITH PROPOFOL ;  Surgeon: Evangeline Hilts, MD;  Location: [facility] ENDOSCOPY;  Service: Gastroenterology;  Laterality: N/A;   EUS N/A 10/16/2023   Procedure: FULL UPPER ENDOSCOPIC ULTRASOUND (EUS) RADIAL;  Surgeon: Evangeline Hilts, MD;  Location: [facility] ENDOSCOPY;  Service: Gastroenterology;  Laterality: N/A;   EYE SURGERY Bilateral    cataract removal   FINE NEEDLE ASPIRATION N/A 10/16/2023   Procedure: FINE NEEDLE ASPIRATION (FNA) LINEAR;  Surgeon: Evangeline Hilts, MD;  Location: [facility] ENDOSCOPY;  Service: Gastroenterology;  Laterality: N/A;   GIVENS CAPSULE STUDY N/A 10/02/2018   Procedure: GIVENS CAPSULE STUDY;  Surgeon: Evangeline Hilts, MD;  Location: [facility] ENDOSCOPY;  Service: Endoscopy;  Laterality: N/A;   IR IMAGING GUIDED PORT INSERTION  01/10/2024   RIGHT/LEFT HEART CATH AND CORONARY ANGIOGRAPHY N/A 03/06/2018   Procedure: RIGHT/LEFT HEART CATH AND CORONARY ANGIOGRAPHY;  Surgeon: Arty Binning, MD;  Location: [facility] INVASIVE CV LAB;  Service: Cardiovascular;   Laterality: N/A;   SMALL BOWEL ENTEROSCOPY  05/19/2019   SPHINCTEROTOMY  10/16/2023   Procedure: SPHINCTEROTOMY;  Surgeon: Evangeline Hilts, MD;  Location: [facility] ENDOSCOPY;  Service: Gastroenterology;;   TEE WITHOUT CARDIOVERSION N/A 04/01/2018   Procedure: TRANSESOPHAGEAL ECHOCARDIOGRAM (TEE);  Surgeon: Hugh Madura, MD;  Location: [facility] ENDOSCOPY;  Service: Cardiovascular;  Laterality: N/A;   TEE WITHOUT CARDIOVERSION N/A 04/15/2018   Procedure: TRANSESOPHAGEAL ECHOCARDIOGRAM (TEE);  Surgeon: Arnoldo Lapping, MD;  Location: [facility] OR;  Service: Open Heart Surgery;  Laterality: N/A;   TOTAL KNEE ARTHROPLASTY Right 04/14/2013   Dr Rozelle Corning   TOTAL KNEE ARTHROPLASTY Right 04/14/2013   Procedure: TOTAL KNEE ARTHROPLASTY;  Surgeon: Jasmine Mesi, MD;  Location: [facility] OR;  Service: Orthopedics;  Laterality: Right;   TRANSCATHETER AORTIC VALVE REPLACEMENT, TRANSFEMORAL  04/15/2018   TRANSCATHETER AORTIC VALVE REPLACEMENT, TRANSFEMORAL Bilateral 04/15/2018   Procedure: TRANSCATHETER AORTIC VALVE REPLACEMENT, TRANSFEMORAL;  Surgeon: Arnoldo Lapping, MD;  Location: [facility] OR;  Service: Open Heart Surgery;  Laterality: Bilateral;     Home Medications:  Prior to Admission medications   Medication Sig Start Date End Date Taking? Authorizing Provider  acetaminophen  (TYLENOL ) 325 MG tablet Take 325-650 mg by mouth every 8 (eight) hours as needed (for headaches).   Yes [provider]  amLODipine  (NORVASC )  5 MG tablet Take 5 mg by mouth daily.   Yes [provider]  Cholecalciferol  (VITAMIN D3) 5000 units CAPS Take 5,000 Units by mouth daily.   Yes [provider]  hydrochlorothiazide  (HYDRODIURIL ) 25 MG tablet Take 25 mg by mouth daily.    Yes [provider]  lidocaine -prilocaine  (EMLA ) cream Apply 1 Application topically as needed. Apply a quarter size amount of cream to port-a-cath site 45 mins to 1 hr prior to port-a-cath being access. Patient taking differently: Apply 1 Application  topically as needed (as directed- apply a quarter-size amount of cream to port-a-cath site 45 mins to 1 hr prior to port-a-cath being access). 01/15/24  Yes Sonja Rienzi, MD  lipase/protease/amylase (CREON ) 36000 UNITS CPEP capsule Take 2 capsules (72,000 Units total) by mouth 3 (three) times daily with meals. May also take 1 capsule (36,000 Units total) as needed (with snacks - up to 4 snacks daily). Patient taking differently: Take 1-2 capsules (36,000-72,000 units) by mouth 3 (three) times daily with meals. May also take 1 capsule (36,000 Units total) as needed (with snacks - up to 4 snacks daily). 01/16/24  Yes Sonja [place], MD  loperamide  (IMODIUM ) 2 MG capsule Take 1 capsule (2 mg total) by mouth as needed for diarrhea or loose stools. 11/12/23  Yes Burton, Lacie K, NP  metFORMIN  (GLUCOPHAGE -XR) 750 MG 24 hr tablet TAKE 2 TABLETS ONE TIME DAILY WITH BREAKFAST 11/27/23  Yes Thapa, [place], MD  metoprolol  succinate (TOPROL -XL) 100 MG 24 hr tablet Take 100 mg by mouth daily. Take with or immediately following a meal.   Yes [provider]  metoprolol  tartrate (LOPRESSOR ) 25 MG tablet Take 1 tablet (25 mg total) by mouth daily as needed (if heart rate > 120). 02/09/24  Yes Dalene Duck, MD  pantoprazole  (PROTONIX ) 20 MG tablet Take 1 tablet (20 mg total) by mouth daily. Patient taking differently: Take 20 mg by mouth daily as needed for heartburn or indigestion. 02/05/24  Yes Sonja Hayden, MD  Polyethyl Glycol-Propyl Glycol (SYSTANE OP) Place 1 drop into both eyes 2 (two) times daily.    Yes [provider]  potassium chloride  SA (KLOR-CON  M) 20 MEQ tablet Take 1 tablet (20 mEq total) by mouth 2 (two) times daily. 11/12/23  Yes Burton, Lacie K, NP  rivaroxaban  (XARELTO ) 20 MG TABS tablet Take 1 tablet (20 mg total) by mouth daily with supper. Patient taking differently: Take 20 mg by mouth every morning. 02/09/24  Yes Dalene Duck, MD  simvastatin  (ZOCOR ) 20 MG tablet Take 20 mg by mouth at  bedtime.   Yes [provider]  Blood Glucose Monitoring Suppl (ACCU-CHEK GUIDE ME) w/Device KIT by Does not apply route.    [provider]  Blood Glucose Monitoring Suppl (ONE TOUCH ULTRA 2) w/Device KIT Use to check blood sugars three times daily DX: E11.9 08/23/20   Lajean Pike, MD  Lancets [facility] DELICA PLUS [place]) MISC TEST BLOOD SUGAR THREE TIMES DAILY 08/16/22   Lajean Pike, MD  ondansetron  (ZOFRAN ) 8 MG tablet Take 1 tablet (8 mg total) by mouth every 8 (eight) hours as needed for nausea or vomiting. Patient not taking: Reported on 02/13/2024 11/12/23   Burton, Lacie K, NP  [facility] ULTRA TEST test strip TEST BLOOD SUGAR THREE TIMES DAILY 04/12/23   Lajean Pike, MD  oxyCODONE  (OXY IR/ROXICODONE ) 5 MG immediate release tablet Take 1 tablet (5 mg total) by mouth every 8 (eight) hours as needed for severe pain (pain score 7-10).  Patient not taking: Reported on 02/13/2024 02/05/24   Sonja Brethren, MD  prochlorperazine  (COMPAZINE ) 10 MG tablet Take 1 tablet (10 mg total) by mouth every 6 (six) hours as needed for nausea or vomiting. Patient not taking: Reported on 02/09/2024 11/12/23   Burton, Lacie K, NP    Inpatient Medications: Scheduled Meds:  amLODipine   5 mg Oral Daily   Chlorhexidine  Gluconate Cloth  6 each Topical Daily   furosemide  20 mg Intravenous Daily   insulin  aspart  0-5 Units Subcutaneous QHS   insulin  aspart  0-6 Units Subcutaneous TID WC   lipase/protease/amylase  72,000 Units Oral TID WC   metoprolol  tartrate  50 mg Oral BID   pantoprazole   20 mg Oral Daily   rivaroxaban   20 mg Oral q morning   simvastatin   20 mg Oral QHS   sodium chloride  flush  3 mL Intravenous Q12H   Continuous Infusions:  levofloxacin  (LEVAQUIN ) IV Stopped (02/13/24 0414)   nitroGLYCERIN  Stopped (02/12/24 2150)   PRN Meds: albuterol , lipase/protease/amylase **AND** lipase/protease/amylase, oxyCODONE , sodium chloride  flush  Allergies:    Allergies  Allergen Reactions   Ace  Inhibitors Swelling and Other (See Comments)    Angioedema    Keflex [Cephalexin] Swelling and Other (See Comments)    Lips became swollen   Rifadin [Rifampin] Swelling and Other (See Comments)    Lips became swollen    Social History:   Social History   Socioeconomic History   Marital status: Widowed    Spouse name: Not on file   Number of children: Not on file   Years of education: Not on file   Highest education level: Not on file  Occupational History   Not on file  Tobacco Use   Smoking status: Never   Smokeless tobacco: Never  Vaping Use   Vaping status: Never Used  Substance and Sexual Activity   Alcohol use: No   Drug use: No   Sexual activity: Not Currently  Other Topics Concern   Not on file  Social History Narrative   Not on file   Social Drivers of Health   Financial Resource Strain: Not on file  Food Insecurity: Not on file  Transportation Needs: Not on file  Physical Activity: Not on file  Stress: Not on file  Social Connections: Not on file  Intimate Partner Violence: Not on file    Family History:    Family History  Problem Relation Age of Onset   Stroke Mother    Hypertension Mother    Hypertension Father    Heart attack Neg Hx      ROS:  Please see the history of present illness.   All other ROS reviewed and negative.     Physical Exam/Data:   Vitals:   02/13/24 0700 02/13/24 0707 02/13/24 0959 02/13/24 1021  BP: (!) 105/45  (!) 125/58 126/60  Pulse: 80  68 69  Resp: 15   14  Temp:  98 F (36.7 C)    TempSrc:  Oral    SpO2: 100%   100%   No intake or output data in the 24 hours ending 02/13/24 1041    02/12/2024   12:42 PM 02/05/2024    1:06 PM 01/15/2024    1:22 PM  Last 3 Weights  Weight (lbs) 154 lb 4 oz 151 lb 3 oz 155 lb 8 oz  Weight (kg) 69.967 kg 68.578 kg 70.534 kg     There is no height or weight on file to calculate  BMI.  General:  Chronically ill appearing, no acute distress HEENT: normal Neck: no  JVD Vascular: No carotid bruits; Distal pulses 2+ bilaterally Cardiac:  irreg. Irreg.  3/6 systolic murmur  Lungs: Bibasilar crackles Abd: soft, nontender, no hepatomegaly  Ext: no edema Musculoskeletal:  No deformities, BUE and BLE strength normal and equal Skin: warm and dry  Neuro:  CNs 2-12 intact, no focal abnormalities noted Psych:  Normal affect   EKG:  The EKG was personally reviewed and demonstrates:  sinus tachycardia with known chronic LBBB Telemetry:  Telemetry was personally reviewed and demonstrates:  sinus rhythm with widened QRS consistent with baseline LBBB.  Relevant CV Studies:  03/10/2019 TTE  IMPRESSIONS     1. The left ventricle has hyperdynamic systolic function, with an  ejection fraction of >65%. The cavity size was normal. There is moderate  concentric left ventricular hypertrophy. Left ventricular diastolic  Doppler parameters are consistent with  impaired relaxation. Elevated left ventricular end-diastolic pressure.   2. The right ventricle has normal systolic function. The cavity was  mildly enlarged. There is no increase in right ventricular wall thickness.  Right ventricular systolic pressure is mildly elevated with an estimated  pressure of 45.8 mmHg.   3. Left atrial size was mildly dilated.   4. Right atrial size was mildly dilated.   5. The mitral valve is degenerative. Mild thickening of the mitral valve  leaflet. Mild calcification of the mitral valve leaflet. There is moderate  mitral annular calcification present. Mitral valve regurgitation is  moderate by color flow Doppler. No  evidence of mitral valve stenosis.   6. Tricuspid valve regurgitation is moderate.   7. Aortic valve regurgitation was not assessed by color flow Doppler.  Moderate stenosis of the aortic valve.   SUMMARY    TAVR with a 23 mm Edwards Sapien 3 THV via the TF approach on  04/15/18. Transaortic gradients are elevated but not significantly  changed from the prior  study, now mean 30 mmHg, previously 24 mmHg,  peak velocity now 3.27 m/s, previously 3.24 m/s.   FINDINGS   Left Ventricle: The left ventricle has hyperdynamic systolic function,  with an ejection fraction of >65%. The cavity size was normal. There is  moderate concentric left ventricular hypertrophy. Left ventricular  diastolic Doppler parameters are consistent   with impaired relaxation. Elevated left ventricular end-diastolic  pressure   Right Ventricle: The right ventricle has normal systolic function. The  cavity was mildly enlarged. There is no increase in right ventricular wall  thickness. Right ventricular systolic pressure is mildly elevated with an  estimated pressure of 45.8 mmHg.   Left Atrium: Left atrial size was mildly dilated.   Right Atrium: Right atrial size was mildly dilated. Right atrial pressure  is estimated at 10 mmHg.   Interatrial Septum: No atrial level shunt detected by color flow Doppler.   Pericardium: There is no evidence of pericardial effusion.   Mitral Valve: The mitral valve is degenerative in appearance. Mild  thickening of the mitral valve leaflet. Mild calcification of the mitral  valve leaflet. There is moderate mitral annular calcification present.  Mitral valve regurgitation is moderate by  color flow Doppler. No evidence of mitral valve stenosis.   Tricuspid Valve: The tricuspid valve is normal in structure. Tricuspid  valve regurgitation is moderate by color flow Doppler.   Aortic Valve: The aortic valve has been repaired/replaced Aortic valve  regurgitation was not assessed by color flow Doppler. There is Moderate  stenosis  of the aortic valve.   Pulmonic Valve: The pulmonic valve was normal in structure. Pulmonic valve  regurgitation is mild by color flow Doppler.   Venous: The inferior vena cava is normal in size with greater than 50%  respiratory variability.   Laboratory Data:  High Sensitivity Troponin:   Recent Labs  Lab  02/09/24 1918 02/09/24 2111 02/12/24 2144 02/13/24 0007 02/13/24 0505  TROPONINIHS 71* 77* 32* 82* 98*     Chemistry Recent Labs  Lab 02/09/24 1918 02/12/24 1206 02/12/24 2144 02/12/24 2203 02/13/24 0507 02/13/24 0521  NA 135 137 134* 136 135 136  K 3.8 4.0 4.2 4.4 3.8 3.8  CL 99 100 102  --  99  --   CO2 27 32 22  --  26  --   GLUCOSE 146* 141* 260*  --  100*  --   BUN 15 11 9   --  10  --   CREATININE 0.79 0.58 0.78  --  0.65  --   CALCIUM  9.4 9.4 9.5  --  9.2  --   MG 1.6*  --  1.5*  --  1.7  --   GFRNONAA >60 >60 >60  --  >60  --   ANIONGAP 9 5 10   --  10  --     Recent Labs  Lab 02/09/24 1918 02/12/24 1206 02/12/24 2144  PROT 7.7 6.8 8.1  ALBUMIN 3.1* 3.3* 3.1*  AST 28 15 29   ALT 25 13 21   ALKPHOS 60 56 73  BILITOT 0.5 0.3 0.5   Lipids No results for input(s): "CHOL", "TRIG", "HDL", "LABVLDL", "LDLCALC", "CHOLHDL" in the last 168 hours.  Hematology Recent Labs  Lab 02/12/24 1206 02/12/24 2144 02/12/24 2203 02/13/24 0507 02/13/24 0521  WBC 5.1 12.0*  --  11.0*  --   RBC 3.64* 4.55  --  3.76*  --   HGB 8.9* 11.1* 12.6 9.3* 9.9*  HCT 27.9* 37.1 37.0 29.6* 29.0*  MCV 76.6* 81.5  --  78.7*  --   MCH 24.5* 24.4*  --  24.7*  --   MCHC 31.9 29.9*  --  31.4  --   RDW 18.6* 18.8*  --  18.5*  --   PLT 229 279  --  222  --    Thyroid  No results for input(s): "TSH", "FREET4" in the last 168 hours.  BNP Recent Labs  Lab 02/12/24 2144  BNP 815.7*    DDimer No results for input(s): "DDIMER" in the last 168 hours.   Radiology/Studies:  DG Chest Portable 1 View Result Date: 02/12/2024 CLINICAL DATA:  Shortness of breath EXAM: PORTABLE CHEST 1 VIEW COMPARISON:  02/09/2024 FINDINGS: Check shadow is stable. Changes of prior TAVR are noted. Right chest wall port is again seen. Aortic calcifications are noted. The lungs are well aerated bilaterally. Diffuse airspace opacity is noted right greater than left consistent with diffuse edema given the abrupt onset. No  sizable effusion is seen. No bony abnormality is noted. IMPRESSION: Diffuse airspace opacities bilaterally likely related to pulmonary edema. Electronically Signed   By: Violeta Grey M.D.   On: 02/12/2024 22:17   DG Chest Port 1 View Result Date: 02/09/2024 CLINICAL DATA:  Shortness of breath EXAM: PORTABLE CHEST 1 VIEW COMPARISON:  05/17/2019 FINDINGS: Changes of prior TAVR are again seen. Aortic calcifications are noted. New right chest wall port is seen in satisfactory position. Lungs are well aerated bilaterally. No focal infiltrate or sizable effusion is seen. No bony abnormality is noted. IMPRESSION:  No active disease. Electronically Signed   By: Violeta Grey M.D.   On: 02/09/2024 19:36     Assessment and Plan:   Acute hypoxic respiratory failure Patient presented to the ED with EMS on CPAP due to acute onset dyspnea while at home. Initially treated with BiPAP, is now on [place] at 4LPM. BNP 815, WBC 12. CXR with diffuse airspace opacities bilaterally, felt consistent with pulmonary edema. Procalcitonin 3.13. BC no growth to date. Respiratory symptoms likely being driven by aortic valve dysfunction (mean gradient up to ), though procalcitonin elevation certainly suggestive of infection as well. Subjective improvement following diuresis. Given concern of AV vegetation on TTE, will need to change current ABX strategy  Acute HFpEF S/P TAVR 23mm Sapien Last echo from 2020 with normally functioning TAVR with elevated mean gradient of 30 mm Hg (previously 24 mm Hg). Echo this admission concerning for decreased valve function. Mean gradient now , peak gradient 97.59mmHg with VTI 1.07cm2. LVEF 55-60%, moderate concentric LVH. Concern of aortic valve vegetation. High suspicion for valve dysfunction causing much of her dyspnea. Possible valvular endocarditis, will reach out to pharmacy regarding ABX change.  Still with evidence of pulmonary edema on exam, though with such significant stenosis, need  to use caution with diuresis in order to protect preload.  Continue IV lasix 20mg  daily.  Will discuss need for TEE vs cardiac CT with structural team.   Elevated troponin Type II MI HS troponin 32->82->98 in the setting of acute hypoxic respiratory failure. Patient without chest pain. Lab consistent with demand ischemia, continue to treat respiratory failure/infection.  Paroxysmal atrial fibrillation Patient with hx pAF since 2018. Initially on Xarelto  but this was discontinued in 2019 due to acute anemia (HGB 5.2) 2/2 GI bleeding and not resumed with ongoing assessment that risks>benefit. Patient found to be in afib with RVR in the ED on 5/4, was started back on Xarelto  20mg  daily and given Rx for toprol  xl.  Sinus rhythm with normal rates aside from tachycardia when first arriving to the ED. Continue metoprolol  tartrate 50mg  BID. Primary team ordered Lopressor  in place of Toprol  XL to allow titration. Agree with this strategy inpatient.  Will need to closely monitor patient back on Xarelto  given hx GI bleeding. May need to transition to heparin  depending on valve plans.   Hypertension Patient hypertensive, up to 185/104 on presentation to the ED. Now with normal BP.  Continue Amlodipine  5mg   Risk Assessment/Risk Scores:        New [place]  Heart Association (NYHA) Functional Class NYHA Class IV  CHA2DS2-VASc Score =     This indicates a  % annual risk of stroke. The patient's score is based upon:           For questions or updates, please contact [facility] Please consult [email or web address] for contact info under    Signed, Leala Prince, PA-C  02/13/2024 10:41 AM   Attending Note:   The patient was seen and examined.  Agree with assessment and plan as noted above.  Changes made to the above note as needed.  Patient seen and independently examined with Nicolette Barrio, PA .   We discussed all aspects of the encounter. I agree with the assessment and plan as stated  above.     AS :  s/p TAVR .  The echo from today shows severe AS with mean AV gradient of 55 mm hg.  Moderate - severe AI   The AV is thickened Concerning for endocarditis  She has dentures - no tooth decay  No other infections      Rapid Afib .   Will repeat ECG  She will need a cardizem  drip to help slow her HR  May need amio drip  DC amlodipine       I have spent a total of 40 minutes with patient reviewing hospital  notes , telemetry, EKGs, labs and examining patient as well as establishing an assessment and plan that was discussed with the patient.  > 50% of time was spent in direct patient care.    [place], Marieta Shorten., MD, [facility] 02/13/2024, 1:46 PM [address]  [address] Office [phone] Pager [phone]

## 2024-02-13 NOTE — Progress Notes (Signed)
 Pt converted to sinus rhythm with PACs, no significant post conversion pauses. Will continue 5 mg/hr cardizem  for one more hour then discontinue. Will continue with scheduled 50 mg metoprolol  at 2200 tonight. Given soft BP, will hold off on adding PO cardizem  for now.   Lamond Pilot, PA-C 02/13/2024, 6:23 PM [phone] [facility] [address] [place], [place] [address]

## 2024-02-13 NOTE — H&P (Signed)
 History and Physical    Janet Williamson:[id number] DOB: 05/29/1941 DOA: 02/12/2024  PCP: Benedetto Brady, MD   Patient coming from: Home   Chief Complaint:  Chief Complaint  Patient presents with   Respiratory Distress    HPI:  Janet Williamson is a 83 y.o. female with hx of pancreatic adenocarcinoma possibly metastatic with indeterminant liver lesion currently under investigation, currently on chemotherapy, biliary stent, paroxysmal A-fib on anticoagulation, history TAVR, CVA, diabetes, hypertension, hx of GI bleed, who was brought in from home with acute worsening shortness of breath.  See additional EMS/ED course below.  Arrived on CPAP and placed on BiPAP in the ED.   Reports that she is had shortness of breath over the past week and suddenly worsened this evening.  Today also reporting pain in her left shoulder but denies any chest pain.  Does have new orthopnea.  Nonproductive cough which began this evening, few weeks of rhinorrhea.  No sick contacts.  No fevers, chills.  Last received chemotherapy this morning.    Review of Systems:  ROS complete and negative except as marked above   Allergies  Allergen Reactions   Ace Inhibitors Swelling and Other (See Comments)    Angioedema    Keflex [Cephalexin] Swelling and Other (See Comments)    Lips became swollen   Rifadin [Rifampin] Swelling and Other (See Comments)    Lips became swollen    Prior to Admission medications   Medication Sig Start Date End Date Taking? Authorizing Provider  acetaminophen  (TYLENOL ) 325 MG tablet Take 325-650 mg by mouth every 8 (eight) hours as needed (for headaches).   Yes [provider]  amLODipine  (NORVASC ) 5 MG tablet Take 5 mg by mouth daily.   Yes [provider]  Cholecalciferol  (VITAMIN D3) 5000 units CAPS Take 5,000 Units by mouth daily.   Yes [provider]  hydrochlorothiazide  (HYDRODIURIL ) 25 MG tablet Take 25 mg by mouth daily.    Yes [provider]  lidocaine -prilocaine  (EMLA ) cream Apply 1 Application topically as needed. Apply a quarter size amount of cream to port-a-cath site 45 mins to 1 hr prior to port-a-cath being access. Patient taking differently: Apply 1 Application topically as needed (as directed- apply a quarter-size amount of cream to port-a-cath site 45 mins to 1 hr prior to port-a-cath being access). 01/15/24  Yes Sonja [place], MD  lipase/protease/amylase (CREON ) 36000 UNITS CPEP capsule Take 2 capsules (72,000 Units total) by mouth 3 (three) times daily with meals. May also take 1 capsule (36,000 Units total) as needed (with snacks - up to 4 snacks daily). Patient taking differently: Take 1-2 capsules (36,000-72,000 units) by mouth 3 (three) times daily with meals. May also take 1 capsule (36,000 Units total) as needed (with snacks - up to 4 snacks daily). 01/16/24  Yes Sonja [place], MD  loperamide  (IMODIUM ) 2 MG capsule Take 1 capsule (2 mg total) by mouth as needed for diarrhea or loose stools. 11/12/23  Yes Burton, Lacie K, NP  metFORMIN  (GLUCOPHAGE -XR) 750 MG 24 hr tablet TAKE 2 TABLETS ONE TIME DAILY WITH BREAKFAST 11/27/23  Yes [place], [place], MD  metoprolol  succinate (TOPROL -XL) 100 MG 24 hr tablet Take 100 mg by mouth daily. Take with or immediately following a meal.   Yes [provider]  metoprolol  tartrate (LOPRESSOR ) 25 MG tablet Take 1 tablet (25 mg total) by mouth daily as needed (if heart rate > 120). 02/09/24  Yes Dalene Duck, MD  pantoprazole  (PROTONIX ) 20 MG  tablet Take 1 tablet (20 mg total) by mouth daily. Patient taking differently: Take 20 mg by mouth daily as needed for heartburn or indigestion. 02/05/24  Yes Sonja [place], MD  Polyethyl Glycol-Propyl Glycol (SYSTANE OP) Place 1 drop into both eyes 2 (two) times daily.    Yes [provider]  potassium chloride  SA (KLOR-CON  M) 20 MEQ tablet Take 1 tablet (20 mEq total) by mouth 2 (two) times daily. 11/12/23  Yes Burton, Lacie K, NP   rivaroxaban  (XARELTO ) 20 MG TABS tablet Take 1 tablet (20 mg total) by mouth daily with supper. Patient taking differently: Take 20 mg by mouth every morning. 02/09/24  Yes Dalene Duck, MD  simvastatin  (ZOCOR ) 20 MG tablet Take 20 mg by mouth at bedtime.   Yes [provider]  Blood Glucose Monitoring Suppl (ACCU-CHEK GUIDE ME) w/Device KIT by Does not apply route.    [provider]  Blood Glucose Monitoring Suppl (ONE TOUCH ULTRA 2) w/Device KIT Use to check blood sugars three times daily DX: E11.9 08/23/20   Lajean Pike, MD  Lancets [facility] DELICA PLUS [place]) MISC TEST BLOOD SUGAR THREE TIMES DAILY 08/16/22   Lajean Pike, MD  ondansetron  (ZOFRAN ) 8 MG tablet Take 1 tablet (8 mg total) by mouth every 8 (eight) hours as needed for nausea or vomiting. Patient not taking: Reported on 02/13/2024 11/12/23   Burton, Lacie K, NP  [facility] ULTRA TEST test strip TEST BLOOD SUGAR THREE TIMES DAILY 04/12/23   Lajean Pike, MD  oxyCODONE  (OXY IR/ROXICODONE ) 5 MG immediate release tablet Take 1 tablet (5 mg total) by mouth every 8 (eight) hours as needed for severe pain (pain score 7-10). Patient not taking: Reported on 02/13/2024 02/05/24   Sonja [place], MD  prochlorperazine  (COMPAZINE ) 10 MG tablet Take 1 tablet (10 mg total) by mouth every 6 (six) hours as needed for nausea or vomiting. Patient not taking: Reported on 02/09/2024 11/12/23   Burton, Lacie K, NP    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 Not seen on most recent TEE 2019.   Cardiomyopathy in other disease    Cataracts, bilateral    Diabetes mellitus    Type 2   Generalized osteoarthritis    GERD (gastroesophageal reflux disease)    HLD (hyperlipidemia)    Hypertension    Hypothyroidism    LBBB (left bundle branch block)    PAF (paroxysmal atrial fibrillation) (HCC)    a. s/p DCCV 06/2017, on Xarelto    Phlebitis     S/P TAVR (transcatheter aortic valve replacement) 04/15/2018   Edwards Sapien 3 THV (size 23 mm, model # 9600TFX, serial # [id number]) via the TF approach   Severe aortic stenosis    a. s/p TAVR 04/2018.   Stroke [facility]) 2008    Past Surgical History:  Procedure Laterality Date   ABDOMINAL HYSTERECTOMY     BILIARY BRUSHING  10/16/2023   Procedure: BILIARY BRUSHING;  Surgeon: Evangeline Hilts, MD;  Location: [name] ENDOSCOPY;  Service: Gastroenterology;;   BILIARY STENT PLACEMENT N/A 10/16/2023   Procedure: BILIARY STENT PLACEMENT;  Surgeon: Evangeline Hilts, MD;  Location: [facility] ENDOSCOPY;  Service: Gastroenterology;  Laterality: N/A;   BIOPSY  05/19/2019   Procedure: BIOPSY;  Surgeon: Lanita Pitman, MD;  Location: [facility] ENDOSCOPY;  Service: Endoscopy;;   BREAST BIOPSY Left    years ago at "a doctor's office"  COLONOSCOPY     COLONOSCOPY WITH PROPOFOL  N/A 10/02/2018   Procedure: COLONOSCOPY WITH PROPOFOL ;  Surgeon: Evangeline Hilts, MD;  Location: [facility] ENDOSCOPY;  Service: Endoscopy;  Laterality: N/A;   ENTEROSCOPY N/A 05/19/2019   Procedure: ENTEROSCOPY;  Surgeon: Lanita Pitman, MD;  Location: [facility] ENDOSCOPY;  Service: Endoscopy;  Laterality: N/A;   ERCP N/A 10/16/2023   Procedure: ENDOSCOPIC RETROGRADE CHOLANGIOPANCREATOGRAPHY (ERCP);  Surgeon: Evangeline Hilts, MD;  Location: [name]OSCOPY;  Service: Gastroenterology;  Laterality: N/A;   ESOPHAGOGASTRODUODENOSCOPY (EGD) WITH PROPOFOL  N/A 10/02/2018   Procedure: ESOPHAGOGASTRODUODENOSCOPY (EGD) WITH PROPOFOL ;  Surgeon: Evangeline Hilts, MD;  Location: [facility]COPY;  Service: Endoscopy;  Laterality: N/A;   ESOPHAGOGASTRODUODENOSCOPY (EGD) WITH PROPOFOL  N/A 04/23/2019   Procedure: ESOPHAGOGASTRODUODENOSCOPY (EGD) WITH PROPOFOL ;  Surgeon: Celedonio Coil, MD;  Location: [facility] ENDOSCOPY;  Service: Endoscopy;  Laterality: N/A;   ESOPHAGOGASTRODUODENOSCOPY (EGD) WITH PROPOFOL  N/A 10/16/2023   Procedure: ESOPHAGOGASTRODUODENOSCOPY (EGD) WITH PROPOFOL ;  Surgeon: Evangeline Hilts, MD;  Location: [facility] ENDOSCOPY;  Service: Gastroenterology;  Laterality: N/A;   EUS N/A 10/16/2023   Procedure: FULL UPPER ENDOSCOPIC ULTRASOUND (EUS) RADIAL;  Surgeon: Evangeline Hilts, MD;  Location: [facility] ENDOSCOPY;  Service: Gastroenterology;  Laterality: N/A;   EYE SURGERY Bilateral    cataract removal   FINE NEEDLE ASPIRATION N/A 10/16/2023   Procedure: FINE NEEDLE ASPIRATION (FNA) LINEAR;  Surgeon: Evangeline Hilts, MD;  Location: [facility] ENDOSCOPY;  Service: Gastroenterology;  Laterality: N/A;   GIVENS CAPSULE STUDY N/A 10/02/2018   Procedure: GIVENS CAPSULE STUDY;  Surgeon: Evangeline Hilts, MD;  Location: [facility] ENDOSCOPY;  Service: Endoscopy;  Laterality: N/A;   IR IMAGING GUIDED PORT INSERTION  01/10/2024   RIGHT/LEFT HEART CATH AND CORONARY ANGIOGRAPHY N/A 03/06/2018   Procedure: RIGHT/LEFT HEART CATH AND CORONARY ANGIOGRAPHY;  Surgeon: Arty Binning, MD;  Location: [facility] INVASIVE CV LAB;  Service: Cardiovascular;  Laterality: N/A;   SMALL BOWEL ENTEROSCOPY  05/19/2019   SPHINCTEROTOMY  10/16/2023   Procedure: SPHINCTEROTOMY;  Surgeon: Evangeline Hilts, MD;  Location: [facility] ENDOSCOPY;  Service: Gastroenterology;;   TEE WITHOUT CARDIOVERSION N/A 04/01/2018   Procedure: TRANSESOPHAGEAL ECHOCARDIOGRAM (TEE);  Surgeon: Hugh Madura, MD;  Location: [facility] ENDOSCOPY;  Service: Cardiovascular;  Laterality: N/A;   TEE WITHOUT CARDIOVERSION N/A 04/15/2018   Procedure: TRANSESOPHAGEAL ECHOCARDIOGRAM (TEE);  Surgeon: Arnoldo Lapping, MD;  Location: [facility] OR;  Service: Open Heart Surgery;  Laterality: N/A;   TOTAL KNEE ARTHROPLASTY Right 04/14/2013   Dr Rozelle Corning   TOTAL KNEE ARTHROPLASTY Right 04/14/2013   Procedure: TOTAL KNEE ARTHROPLASTY;  Surgeon: Jasmine Mesi, MD;  Location: [facility] OR;  Service: Orthopedics;  Laterality: Right;   TRANSCATHETER AORTIC VALVE REPLACEMENT, TRANSFEMORAL  04/15/2018   TRANSCATHETER AORTIC VALVE REPLACEMENT, TRANSFEMORAL Bilateral 04/15/2018   Procedure: TRANSCATHETER AORTIC VALVE REPLACEMENT,  TRANSFEMORAL;  Surgeon: Arnoldo Lapping, MD;  Location: [facility] OR;  Service: Open Heart Surgery;  Laterality: Bilateral;     reports that she has never smoked. She has never used smokeless tobacco. She reports that she does not drink alcohol and does not use drugs.  Family History  Problem Relation Age of Onset   Stroke Mother    Hypertension Mother    Hypertension Father    Heart attack Neg Hx      Physical Exam: Vitals:   02/12/24 2155 02/12/24 2200 02/12/24 2205 02/12/24 2230  BP: (!) 136/97 (!) 154/78 (!) 163/82 133/62  Pulse: (!) 112 (!) 108 (!) 107 (!) 105  Resp: (!) 30 (!) 25 (!) 22 (!) 26  Temp:  TempSrc:      SpO2: 94% 95% 95% 98%    Gen: Awake, alert, chronically ill appearing   CV: Regular, Soft S1/S2, blowing 3/6 holosystolic murmur throughout precordium.  Resp: Normal WOB, on Friend, Scattered rales.  Abd: Flat, normoactive, nontender MSK: Symmetric, no edema  Skin: No rashes or lesions to exposed skin  Neuro: Alert and interactive  Psych: euthymic, appropriate    Data review:   Labs reviewed, notable for:   VBG 7.25/54 Bicarb 22, no anion gap Blood glucose 260 BNP 815 HS Trop 32 WBC 5-> 12?  Hemoglobin 8.9 -> 11?    Micro:  Results for orders placed or performed during the hospital encounter of 05/17/19  SARS CORONAVIRUS 2 Nasal Swab Aptima Multi Swab     Status: None   Collection Time: 05/17/19  6:57 PM   Specimen: Aptima Multi Swab; Nasal Swab  Result Value Ref Range Status   SARS Coronavirus 2 NEGATIVE NEGATIVE Final    Comment: (NOTE) SARS-CoV-2 target nucleic acids are NOT DETECTED. The SARS-CoV-2 RNA is generally detectable in upper and lower respiratory specimens during the acute phase of infection. Negative results do not preclude SARS-CoV-2 infection, do not rule out co-infections with other pathogens, and should not be used as the sole basis for treatment or other patient management decisions. Negative results must be combined with  clinical observations, patient history, and epidemiological information. The expected result is Negative. Fact Sheet for Patients: HairSlick.no Fact Sheet for Healthcare Providers: quierodirigir.com This test is not yet approved or cleared by the [place]  FDA and  has been authorized for detection and/or diagnosis of SARS-CoV-2 by FDA under an Emergency Use Authorization (EUA). This EUA will remain  in effect (meaning this test can be used) for the duration of the COVID-19 declaration under Section 56 4(b)(1) of the Act, 21 U.S.C. section 360bbb-3(b)(1), unless the authorization is terminated or revoked sooner. Performed at [facility], [address] [place], [place] [address]     Imaging reviewed:  DG Chest Portable 1 View Result Date: 02/12/2024 CLINICAL DATA:  Shortness of breath EXAM: PORTABLE CHEST 1 VIEW COMPARISON:  02/09/2024 FINDINGS: Check shadow is stable. Changes of prior TAVR are noted. Right chest wall port is again seen. Aortic calcifications are noted. The lungs are well aerated bilaterally. Diffuse airspace opacity is noted right greater than left consistent with diffuse edema given the abrupt onset. No sizable effusion is seen. No bony abnormality is noted. IMPRESSION: Diffuse airspace opacities bilaterally likely related to pulmonary edema. Electronically Signed   By: Violeta Grey M.D.   On: 02/12/2024 22:17    EKG:  Personally reviewed sinus rhythm, LAE, LAD, left bundle branch block, discordant ST changes  EMS/ED Course:  On EMS arrival sat 88% on RA, placed on CPAP. In ED transitioned to BiPAP. Treated with Lasix 20 mg IV, magnesium , ordered for vancomycin  and aztreonam although held waiting on cultures and changed antibiotics after admission. Able to be weaned off biPAP to [place] prior to admission   Assessment/Plan:  83 y.o. female with hx pancreatic adenocarcinoma possibly metastatic with  indeterminant liver lesion currently under investigation, currently on chemotherapy, biliary stent, paroxysmal A-fib on anticoagulation, history TAVR, CVA, diabetes, hypertension, hx of GI bleed, who was brought in from home with acute worsening shortness of breath. Admitted with AHRF initially requiring BiPAP, suspect acute heart failure with pulmonary edema, v. community acquired pneumonia.   AHRF, initially requiring BiPAP Suspect acute heart failure with pulm edema v  CAP   SIRS 2/2 respiratory failure vs. Sepsis Hx 1 week of progressive dyspnea, new orthopnea. Cough tonight. No significant other infectious symptoms. VS initially 88% on RA, requiring CPAP with EMS -> BiPAP in ED for WOB. Tachycardic in 110s, tachypneic in mid 20's-low 30s. VBG 7.25/54 BNP 815, no recent baseline. HS trop 32. WBC 12. Lactate pending. CXR with diffuse airspace opacities right greater than left.  Etiology question acute heart failure with pulmonary edema versus less likely community-acquired pneumonia.  -S/p BiPAP, transitioned to nasal cannula and tolerating well -Repeat VBG in a.m. off BiPAP -S/p Lasix 20 mg IV x 1, tentatively ordered for 20 mg IV daily. -Check TTE -Daily weights, I's and O's -Start levofloxacin  750 mg IV daily for now -Check procalcitonin, MRSA nares, flu/COVID/RSV, sputum culture.  Follow-up blood cultures -Albuterol  neb every 4 hours as needed, incentive spirometer, flutter valve, encourage out of bed to chair  Acute myocardial injury No chest pain although reported left shoulder pain within the past day.  EKG with unchanged left bundle branch block.  High-sensitivity troponin 32 with repeat pending. -Suspect this is demand event in the setting of respiratory failure, heart failure.  Management directed at these issues.  Chronic medical problems: Pancreatic adenocarcinoma: possibly metastatic with indeterminant liver lesion currently under investigation ( recent MRI pending read), currently  on chemotherapy with gemcitabine , Abraxane .  Last chemotherapy 5/7.  Palliative biliary stent in place.  On Creon  for pancreatic exocrine deficiency.  Follows with Dr. [name] (oncology).  Paroxysmal A-fib: Recent ED visit 5/4 with what was though to be new onset Afib (although has prior hx of paroxysmal Afib), and was restarted on Xarelto  (previously discontinued with GI bleed).  Continue home metoprolol , split to tartrate 50 mg BID in case requires titration. Continue Xarelto  20 mg daily. History TAVR: Will be evaluated on TTE CVA: On DOAC per above, continue simvastatin  20 mg. Diabetes type II, with hyperglycemia: Home regimen metformin .  SSI for very sensitive, at bedtime correction.   Hypertension: Continue home amlodipine  5 mg, hydrochlorothiazide  25 mg. Hx of GI bleed: Continue home pantoprazole  20 mg Cancer related pain: Continue home oxycodone  prn   There is no height or weight on file to calculate BMI.    DVT prophylaxis:  Xarelto  Code Status:  DNR/DNI(Do NOT Intubate); confirmed with patient  Diet:  Diet Orders (From admission, onward)     Start     Ordered   02/13/24 0051  Diet 2 gram sodium Room service appropriate? Yes; Fluid consistency: Thin  Diet effective now       Question Answer Comment  Room service appropriate? Yes   Fluid consistency: Thin      02/13/24 0050           Family Communication:  Yes discussed with daughters at bedside   Consults:  None   Admission status:   Inpatient, Step Down Unit  Severity of Illness: The appropriate patient status for this patient is INPATIENT. Inpatient status is judged to be reasonable and necessary in order to provide the required intensity of service to ensure the patient's safety. The patient's presenting symptoms, physical exam findings, and initial radiographic and laboratory data in the context of their chronic comorbidities is felt to place them at high risk for further clinical deterioration. Furthermore, it is not  anticipated that the patient will be medically stable for discharge from the hospital within 2 midnights of admission.   * I certify that at the point of admission it is my  clinical judgment that the patient will require inpatient hospital care spanning beyond 2 midnights from the point of admission due to high intensity of service, high risk for further deterioration and high frequency of surveillance required.*   Arnulfo Larch, MD [facility]  How to contact the [facility] Attending or Consulting provider 7A - 7P or covering provider during after hours 7P -7A, for this patient.  Check the care team in [facility] and look for a) attending/consulting [facility] provider listed and b) the [facility] team listed Log into [email or web address] and use [place]'s universal password to access. If you do not have the password, please contact the hospital operator. Locate the [facility] provider you are looking for under [facility] and page to a number that you can be directly reached. If you still have difficulty reaching the provider, please page the [facility] (Director on Call) for the Hospitalists listed on amion for assistance.  02/13/2024, 12:52 AM

## 2024-02-13 NOTE — Assessment & Plan Note (Addendum)
-   Tachycardia, tachypnea, leukocytosis.  Suspected pulmonary source on admission - Possibility of endocarditis given echo findings of aortic valve, see below - Continue Rocephin  and azithromycin . Tolerating well -Follow-up blood cultures.  Only 1 set drawn on admission prior to antibiotics - All cultures remaining negative so far

## 2024-02-13 NOTE — Assessment & Plan Note (Addendum)
-   Suspecting demand ischemia in setting of RVR but also concerned for underlying ischemic etiology with uptrending troponin -Cardiology consulted to help further evaluate; agreed with demand ischemia -Troponin peaked at 98 - no further workup at this time per cardiology

## 2024-02-13 NOTE — Assessment & Plan Note (Signed)
-

## 2024-02-13 NOTE — Assessment & Plan Note (Addendum)
-   Concern for etiology from moderate to severe mitral valve regurg and diseased AV - Appreciate cardiology evaluation - Continue diuresis as pressure can tolerate - CXR repeated on 02/15/2024 showing significant improvement

## 2024-02-13 NOTE — Progress Notes (Signed)
  Echocardiogram 2D Echocardiogram has been performed.  Raynelle Callow R 02/13/2024, 10:14 AM

## 2024-02-13 NOTE — Plan of Care (Signed)

## 2024-02-13 NOTE — Assessment & Plan Note (Signed)
-   A1c 7.1% on 01/13/24 - Continue SSI and CBG monitoring

## 2024-02-13 NOTE — Progress Notes (Addendum)
 Janet Williamson   DOB:04-16-1941   ZO#:[id number]      ASSESSMENT & PLAN:  Malignant neoplasm head of pancreas with possible liver mets - Diagnosed 10/16/2023.  Status post stenting.  Not a surgical candidate.  PET/CT done shows no definitive evidence of metastatic disease. -Chemotherapy with gemcitabine  alone initiated on 11/13/2023.  Today added Abraxane  however patient could not tolerate and is now back on gemcitabine  alone. - Restaging CT scan done 01/29/2024 showed stable pancreatic mass but showed new indeterminate hypodense lesion in the left hepatic lobe new since prior exam, suspicious for mets. - MR liver done 02/10/2024, pending results. - Medical oncology/Dr. [name] following  Abdominal pain - Secondary to malignancy - Continue pain management - Monitor closely  Leukocytosis - WBC elevated 11.0 - Continue IV antibiotics as ordered - Monitor CBC with differential  Anemia, microcytic - Hemoglobin 9.3 - Likely multifactorial due to malignancy and recent chemotherapy - No transfusional intervention required at this time - Continue to monitor CBC with differential   Code Status DNR-Limited  Subjective:  Patient admitted overnight with complaints of worsening shortness of breath.  Patient seen awake and alert laying supine in bed.  Multiple family members at bedside.  Reports that she is beginning to feel much better.  Patient and family members discussed admission and current status with Dr. [name].  Objective:  Vitals:   02/13/24 1200 02/13/24 1231  BP: 114/61 (!) 121/58  Pulse: 64 65  Resp: (!) 29 20  Temp:    SpO2: 100% 100%    No intake or output data in the 24 hours ending 02/13/24 1343   PHYSICAL EXAMINATION: ECOG PERFORMANCE STATUS: 3 - Symptomatic, >50% confined to bed  Vitals:   02/13/24 1200 02/13/24 1231  BP: 114/61 (!) 121/58  Pulse: 64 65  Resp: (!) 29 20  Temp:    SpO2: 100% 100%   There were no vitals filed for this visit.  GENERAL: alert, no  distress and comfortable SKIN: skin color, texture, turgor are normal, no rashes or significant lesions EYES: normal, conjunctiva are pink and non-injected, sclera clear OROPHARYNX: no exudate, no erythema and lips, buccal mucosa, and tongue normal  NECK: supple, thyroid  normal size, non-tender, without nodularity LYMPH: no palpable lymphadenopathy in the cervical, axillary or inguinal LUNGS: clear to auscultation and percussion with normal breathing effort HEART: regular rate & rhythm and no murmurs and no lower extremity edema ABDOMEN: abdomen soft, non-tender and normal bowel sounds MUSCULOSKELETAL: no cyanosis of digits and no clubbing  PSYCH: alert & oriented x 3 with fluent speech NEURO: no focal motor/sensory deficits   All questions were answered. The patient knows to call the clinic with any problems, questions or concerns.   The total time spent in the appointment was 40 minutes encounter with patient including review of chart and various tests results, discussions about plan of care and coordination of care plan  Jacqualin Mate, NP 02/13/2024 1:43 PM    Labs Reviewed:  Lab Results  Component Value Date   WBC 11.0 (H) 02/13/2024   HGB 9.9 (L) 02/13/2024   HCT 29.0 (L) 02/13/2024   MCV 78.7 (L) 02/13/2024   PLT 222 02/13/2024   Recent Labs    02/09/24 1918 02/12/24 1206 02/12/24 2144 02/12/24 2203 02/13/24 0507 02/13/24 0521  NA 135 137 134* 136 135 136  K 3.8 4.0 4.2 4.4 3.8 3.8  CL 99 100 102  --  99  --   CO2 27 32 22  --  26  --   GLUCOSE 146* 141* 260*  --  100*  --   BUN 15 11 9   --  10  --   CREATININE 0.79 0.58 0.78  --  0.65  --   CALCIUM  9.4 9.4 9.5  --  9.2  --   GFRNONAA >60 >60 >60  --  >60  --   PROT 7.7 6.8 8.1  --   --   --   ALBUMIN 3.1* 3.3* 3.1*  --   --   --   AST 28 15 29   --   --   --   ALT 25 13 21   --   --   --   ALKPHOS 60 56 73  --   --   --   BILITOT 0.5 0.3 0.5  --   --   --     Studies Reviewed:  ECHOCARDIOGRAM  COMPLETE Result Date: 02/13/2024    ECHOCARDIOGRAM REPORT   Patient Name:   Janet Williamson Date of Exam: 02/13/2024 Medical Rec #:  [id number]         Height:       63.0 in Accession #:    [id number]        Weight:       154.2 lb Date of Birth:  19-Jul-1941          BSA:          1.732 m Patient Age:    83 years          BP:           105/45 mmHg Patient Gender: F                 HR:           72 bpm. Exam Location:  Inpatient Procedure: 2D Echo, 3D Echo, Cardiac Doppler and Color Doppler (Both Spectral            and Color Flow Doppler were utilized during procedure). Indications:    I50.40* Unspecified combined systolic (congestive) and diastolic                 (congestive) heart failure  History:        Patient has prior history of Echocardiogram examinations, most                 recent 03/20/2019. Abnormal ECG, Stroke, Aortic Valve Disease,                 Arrythmias:LBBB, Signs/Symptoms:Edema and Dyspnea; Risk                 Factors:Hypertension and Diabetes. Aortic stenosis. TAVR.                 Metastatic cancer. Chemo.                 Aortic Valve: 23 mm Edwards Sapien prosthetic, stented (TAVR)                 valve is present in the aortic position. Procedure Date:                 04/15/2018.  Sonographer:    Raynelle Callow RDCS Referring Phys: [id number] [facility] IMPRESSIONS  1. Left ventricular ejection fraction, by estimation, is 55 to 60%. The left ventricle has normal function. The left ventricle has no regional wall motion abnormalities. There is moderate concentric left ventricular hypertrophy. Left ventricular diastolic parameters are consistent with Grade  I diastolic dysfunction (impaired relaxation). Elevated left ventricular end-diastolic pressure.  2. Right ventricular systolic function is normal. The right ventricular size is normal. There is mildly elevated pulmonary artery systolic pressure.  3. Left atrial size was mildly dilated.  4. The mitral valve is degenerative. Moderate to severe  mitral valve regurgitation. Mild mitral stenosis. Moderate mitral annular calcification.  5. Tricuspid valve regurgitation is mild to moderate.  6. TAVR valve is thickened and gradients are severely elevated consistent with prosthetic valve stenosis. Mean gradient has increased to 55 mmHg from 30 mmHg 03/2019. The aortic valve has been repaired/replaced. There is severe calcifcation of the aortic  valve. There is severe thickening of the aortic valve. Aortic valve regurgitation is moderate to severe. There is a 23 mm Edwards Sapien prosthetic (TAVR) valve present in the aortic position. Procedure Date: 04/15/2018. Echo findings are consistent with stenosis and regurgitation of the aortic prosthesis. Aortic valve area, by VTI measures 1.07 cm. Aortic valve mean gradient measures 55.0 mmHg. Aortic valve Vmax measures 4.94 m/s.  7. Pulmonic valve regurgitation is moderate.  8. The inferior vena cava is dilated in size with >50% respiratory variability, suggesting right atrial pressure of 8 mmHg. Conclusion(s)/Recommendation(s): Findings concerning for aortic valve vegetation, would recommend a Transesophageal Echocardiogram for clarification. FINDINGS  Left Ventricle: Left ventricular ejection fraction, by estimation, is 55 to 60%. The left ventricle has normal function. The left ventricle has no regional wall motion abnormalities. The left ventricular internal cavity size was normal in size. There is  moderate concentric left ventricular hypertrophy. Left ventricular diastolic parameters are consistent with Grade I diastolic dysfunction (impaired relaxation). Elevated left ventricular end-diastolic pressure. Right Ventricle: The right ventricular size is normal. No increase in right ventricular wall thickness. Right ventricular systolic function is normal. There is mildly elevated pulmonary artery systolic pressure. The tricuspid regurgitant velocity is 2.94  m/s, and with an assumed right atrial pressure of 8 mmHg, the  estimated right ventricular systolic pressure is 42.6 mmHg. Left Atrium: Left atrial size was mildly dilated. Right Atrium: Right atrial size was normal in size. Pericardium: There is no evidence of pericardial effusion. Mitral Valve: The mitral valve is degenerative in appearance. There is moderate thickening of the mitral valve leaflet(s). There is mild calcification of the mitral valve leaflet(s). Moderate mitral annular calcification. Moderate to severe mitral valve regurgitation. Mild mitral valve stenosis. MV peak gradient, 10.4 mmHg. The mean mitral valve gradient is 4.0 mmHg. Tricuspid Valve: The tricuspid valve is normal in structure. Tricuspid valve regurgitation is mild to moderate. No evidence of tricuspid stenosis. Aortic Valve: TAVR valve is thickened and gradients are severely elevated consistent with prosthetic valve stenosis. Mean gradient has increased to 55 mmHg from 30 mmHg 03/2019. The aortic valve has been repaired/replaced. There is severe calcifcation of the aortic valve. There is severe thickening of the aortic valve. Aortic valve regurgitation is moderate to severe. Aortic regurgitation PHT measures 409 msec. Aortic valve mean gradient measures 55.0 mmHg. Aortic valve peak gradient measures 97.6 mmHg. Aortic valve area, by VTI measures 1.07 cm. There is a 23 mm Edwards Sapien prosthetic, stented (TAVR) valve present in the aortic position. Procedure Date: 04/15/2018. Pulmonic Valve: The pulmonic valve was grossly normal. Pulmonic valve regurgitation is moderate. No evidence of pulmonic stenosis. Aorta: The aortic root and ascending aorta are structurally normal, with no evidence of dilitation. Venous: The inferior vena cava is dilated in size with greater than 50% respiratory variability, suggesting right atrial pressure of 8 mmHg.  IAS/Shunts: No atrial level shunt detected by color flow Doppler. Additional Comments: 3D was performed not requiring image post processing on an independent  workstation and was abnormal.  LEFT VENTRICLE PLAX 2D LVIDd:         3.80 cm     Diastology LVIDs:         2.60 cm     LV e' medial:    4.24 cm/s LV PW:         1.30 cm     LV E/e' medial:  30.1 LV IVS:        1.40 cm     LV e' lateral:   5.11 cm/s LVOT diam:     2.30 cm     LV E/e' lateral: 25.0 LV SV:         116 LV SV Index:   67 LVOT Area:     4.15 cm  LV Volumes (MOD) LV vol d, MOD A2C: 87.2 ml LV vol d, MOD A4C: 86.3 ml LV vol s, MOD A2C: 44.6 ml LV vol s, MOD A4C: 38.9 ml LV SV MOD A2C:     42.6 ml LV SV MOD A4C:     86.3 ml LV SV MOD BP:      46.3 ml RIGHT VENTRICLE             IVC RV S prime:     10.90 cm/s  IVC diam: 2.20 cm TAPSE (M-mode): 2.6 cm LEFT ATRIUM             Index        RIGHT ATRIUM           Index LA diam:        4.30 cm 2.48 cm/m   RA Area:     12.60 cm LA Vol (A2C):   53.3 ml 30.78 ml/m  RA Volume:   27.80 ml  16.06 ml/m LA Vol (A4C):   56.0 ml 32.34 ml/m LA Biplane Vol: 54.7 ml 31.59 ml/m  AORTIC VALVE                     PULMONIC VALVE AV Area (Vmax):    1.03 cm      PR End Diast Vel: 1.67 msec AV Area (Vmean):   1.06 cm AV Area (VTI):     1.07 cm AV Vmax:           494.00 cm/s AV Vmean:          336.500 cm/s AV VTI:            1.080 m AV Peak Grad:      97.6 mmHg AV Mean Grad:      55.0 mmHg LVOT Vmax:         123.00 cm/s LVOT Vmean:        86.200 cm/s LVOT VTI:          0.278 m LVOT/AV VTI ratio: 0.26 AI PHT:            409 msec  AORTA Ao Root diam: 2.80 cm Ao Asc diam:  3.70 cm MITRAL VALVE                  TRICUSPID VALVE MV Area (PHT): 3.10 cm       TR Peak grad:   34.6 mmHg MV Area VTI:   2.62 cm       TR Vmax:        294.00 cm/s MV Peak  grad:  10.4 mmHg MV Mean grad:  4.0 mmHg       SHUNTS MV Vmax:       1.61 m/s       Systemic VTI:  0.28 m MV Vmean:      99.8 cm/s      Systemic Diam: 2.30 cm MV Decel Time: 245 msec MR Peak grad:    180.1 mmHg MR Mean grad:    77.0 mmHg MR Vmax:         671.00 cm/s MR Vmean:        372.0 cm/s MR PISA:         3.08 cm MR PISA Eff ROA:  18 mm MR PISA Radius:  0.70 cm MV E velocity: 127.50 cm/s MV A velocity: 137.50 cm/s MV E/A ratio:  0.93 Maudine Sos MD Electronically signed by Maudine Sos MD Signature Date/Time: 02/13/2024/12:36:11 PM    Final    DG Chest Portable 1 View Result Date: 02/12/2024 CLINICAL DATA:  Shortness of breath EXAM: PORTABLE CHEST 1 VIEW COMPARISON:  02/09/2024 FINDINGS: Check shadow is stable. Changes of prior TAVR are noted. Right chest wall port is again seen. Aortic calcifications are noted. The lungs are well aerated bilaterally. Diffuse airspace opacity is noted right greater than left consistent with diffuse edema given the abrupt onset. No sizable effusion is seen. No bony abnormality is noted. IMPRESSION: Diffuse airspace opacities bilaterally likely related to pulmonary edema. Electronically Signed   By: Violeta Grey M.D.   On: 02/12/2024 22:17   DG Chest Port 1 View Result Date: 02/09/2024 CLINICAL DATA:  Shortness of breath EXAM: PORTABLE CHEST 1 VIEW COMPARISON:  05/17/2019 FINDINGS: Changes of prior TAVR are again seen. Aortic calcifications are noted. New right chest wall port is seen in satisfactory position. Lungs are well aerated bilaterally. No focal infiltrate or sizable effusion is seen. No bony abnormality is noted. IMPRESSION: No active disease. Electronically Signed   By: Violeta Grey M.D.   On: 02/09/2024 19:36   CT PANCREAS ABDOMEN W WO CONTRAST Result Date: 02/04/2024 CLINICAL DATA:  Pancreatic cancer, assess treatment response. * Tracking Code: BO * EXAM: CT ABDOMEN WITHOUT AND WITH CONTRAST TECHNIQUE: Multidetector CT imaging of the abdomen was performed following the standard protocol before and following the bolus administration of intravenous contrast. RADIATION DOSE REDUCTION: This exam was performed according to the departmental dose-optimization program which includes automated exposure control, adjustment of the mA and/or kV according to patient size and/or use of iterative  reconstruction technique. CONTRAST:  OMNIPAQUE  IOHEXOL  350 MG/ML SOLN COMPARISON:  10/08/2023 FINDINGS: Lower chest: 10 mm nodule within the right lower lobe, axial image # 4/6, is stable since remote prior examination of 04/09/2018 safely considered benign. A 4 mm adjacent pulmonary nodule within the right lower lobe was present on immediate prior examination, but was not seen remote prior examination and is technically indeterminate. This is unchanged, however. Cardiac size within normal limits. Hypoattenuation of the cardiac blood pool in keeping with at least mild anemia. Hepatobiliary: Interval palliative stenting of the extrahepatic bile duct. Pneumobilia within the intra and extrahepatic biliary tree in keeping with patency of the stented segment. 11 mm indeterminate hypodense lesion is seen left hepatic lobe, axial image # 14/9 new since prior examination and demonstrating some degree of marginal irregular enhancement, suspicious for intrahepatic metastasis. Portal vein is patent. Gallbladder is unremarkable. Pancreas: The lobulated hypoenhancing mass within the head of the pancreas extending into the uncinate process is grossly stable measuring  3.8 x 4.7 x 3.2 cm in greatest dimension. The mass encases the superior mesenteric artery at axial image # 46/9 encases and severely narrows the superior mesenteric vein at axial image # 42/9 obstructs the central pancreatic duct which is stably, markedly dilated throughout the more distal pancreas, and abuts the stented portion the extrahepatic. The mass extends into the small bowel mesentery inferiorly, similar to prior examination with occlusion of several mesenteric venous tributaries. The body and tail the pancreas demonstrates progressive parenchymal atrophy. No superimposed peripancreatic inflammatory changes are identified. The mass abuts but does not clearly a adjacent second and third portion of the duodenum. Spleen: Unremarkable Adrenals/Urinary  Tract: Adrenal glands are unremarkable. Kidneys are normal, without renal calculi, focal lesion, or hydronephrosis. Stomach/Bowel: There is mild asymmetric wall thickening the visualized ascending and proximal transverse colon which may relate to impaired venous drainage related to occluded mesenteric venous collateral centrally. Visualized large and small bowel are otherwise. No free intraperitoneal gas or fluid. Vascular/Lymphatic: As noted above, the superior mesenteric vein is markedly narrowed and the superior mesenteric artery are encased by the malignant mass. Additionally, numerous mesenteric veins are obliterated by the mass and there is collateralization to the gastroepiploic vein and inferior mesenteric vein which ultimately drain to the splenic vein. Other: No abdominal wall hernia Musculoskeletal: No acute bone abnormality. No lytic or blastic bone lesion. Osseous structures are age appropriate. IMPRESSION: 1. Grossly stable size and appearance of a 4.7 cm malignant mass within the head of the pancreas extending into the uncinate process. The mass encases the superior mesenteric artery and encases and severely narrows the superior mesenteric vein. The mass also extends into the small bowel mesentery inferiorly with occlusion of several mesenteric venous tributaries. 2. Interval palliative stenting of the extrahepatic bile duct with expected pneumobilia. 3. 11 mm indeterminate hypodense lesion within the left hepatic lobe, new since prior examination, suspicious for a intrahepatic metastasis. This could be confirmed with dedicated contrast enhanced MRI imaging or re-evaluated on subsequent examination. 4. 4 mm right lower lobe pulmonary nodule, stable since immediate prior examination, but not seen on remote prior examination. Close attention on follow-up imaging is warranted. 5. Hypoattenuation of the cardiac blood pool in keeping with at least mild anemia. 6. Mild asymmetric wall thickening of the  ascending and proximal transverse colon which may relate to impaired venous drainage related to occluded mesenteric venous tributaries. Electronically Signed   By: Worthy Heads M.D.   On: 02/04/2024 19:51     Addendum I have seen the patient, examined her. I agree with the assessment and and plan and have edited the notes.   Pt was admitted after her chemo yesterday.  She has been treated for pulmonary edema and pneumonia, (+) elevated troponin concerning for demand ischemia.  Patient has been seen by cardiology.  Will hold her chemo for the next 2 to 4 weeks.  Her recent staging CT scan was concerning for new liver metastasis, liver MRI was done a few days ago but has not been read yet.  Patient and her daughter nurse to call me when she is ready to be discharged from hospital, and I will set up her office visit.   Sonja [place] MD 02/13/2024

## 2024-02-13 NOTE — ED Notes (Signed)
 Pt leaving [facility] with transport in no new onset distress at this time.

## 2024-02-13 NOTE — Plan of Care (Signed)
 Patient seen and rounded on in the ER this morning. Patient is an 83 year old female with pancreatic adenocarcinoma on gemcitabine  with questionable mets currently being followed by oncology.  Also PMH of TAVR, PAF, CVA, diabetes, HTN, GI bleed who presented with dyspnea and two-pillow orthopnea.  She also endorsed some swelling in her legs after standing but states that they improve after laying in the bed flat. Over the weekend had developed some cough but denied any sputum production.  Her symptoms of shortness of breath and leg swelling had been going on for approximately 1 month. She did endorse some chest pain over the weekend as well.  She just received day 8 cycle 4 of gemcitabine  on 02/12/2024 (day 1 was on 4/30).   Notable labs include: Procalcitonin 3.13 WBC 12 on admission Troponin steadily uptrending (32 >>82>>98) BNP 815  CXR showed concern for diffuse airspace opacities possibly pulmonary edema.  Given her cough, leukocytosis, and procalcitonin, she was also started on antibiotics for pneumonia coverage.  Echo was also obtained which showed concern for moderate to severe MR.  Thickened TAVR valve with prosthetic valve stenosis; severe thickening of aortic valve.  Echo report states findings concerning for potential aortic valve vegetation and recommending to consider TEE.  Only 1 set of blood cultures was obtained on admission.  Requesting to see if additional set can be obtained on 02/13/2024 but has already had antibiotics.  Cardiology was consulted on admission as well due to elevated troponin, BNP.  Echo findings resulted after consultation request..  On exam, loud murmur as expected. Trace LE edema. Lungs sounded more consistent with rales.   A&P: - continue abx; might need to broaden - follow blood culture - trend PCT - continue lasix - followup cardiology evaluation - continue trending trop to peak - EKG if CP  Faith Homes, MD [facility] 02/13/2024, 1:00  PM

## 2024-02-13 NOTE — Assessment & Plan Note (Addendum)
-   echo noting "TAVR valve is thickened and gradients are severely elevated consistent  with prosthetic valve stenosis" and "The aortic valve has been repaired/replaced. There is  severe calcifcation of the aortic  valve. There is severe thickening of the aortic valve. Aortic valve regurgitation is moderate to severe" - potential concern for vegetation of AV; cardiology has evaluated and due to not a valve replacement candidate in setting of pancreatic cancer, TEE was not recommended -Consulting ID for further assistance regarding antibiotic regimen and if TEE still needed

## 2024-02-13 NOTE — Assessment & Plan Note (Addendum)
-   follows with oncology, Dr. [name] - diagnosed 10/16/23 with EUS confirming adenocarcinoma  - Did not tolerate gemcitabine /Abraxane ; now on only gemcitabine .  Completed day 8 cycle 4 on 02/12/2024 - oncology aware of admission  - Follow-up MRI liver findings with oncology

## 2024-02-13 NOTE — Assessment & Plan Note (Addendum)
-   Not on oxygen at baseline - Possibly mixed etiology from underlying pulmonary edema and/or infiltrates - Successfully weaned to room air

## 2024-02-13 NOTE — Progress Notes (Signed)
 Progress Note    Janet Williamson   ZOX:[id number]  DOB: Feb 24, 1941  DOA: 02/12/2024     0 PCP: Benedetto Brady, MD  Initial CC: SOB  Hospital Course: Janet Williamson is an 83 year old female with pancreatic adenocarcinoma on gemcitabine  with questionable mets currently being followed by oncology.  Also PMH of TAVR, PAF, CVA, diabetes, HTN, GI bleed who presented with dyspnea and two-pillow orthopnea.  She also endorsed some swelling in her legs after standing but states that they improve after laying in the bed flat. Over the weekend had developed some cough but denied any sputum production.  Her symptoms of shortness of breath and leg swelling had been going on for approximately 1 month. She did endorse some chest pain over the weekend as well.  She just received day 8 cycle 4 of gemcitabine  on 02/12/2024 (day 1 was on 4/30).    Notable labs included: Procalcitonin 3.13 WBC 12 on admission Troponin steadily uptrending (32 >>82>>98) BNP 815   CXR showed concern for diffuse airspace opacities possibly pulmonary edema.   Given her cough, leukocytosis, and procalcitonin, she was also started on antibiotics for pneumonia coverage.   Echo was also obtained which showed concern for moderate to severe MR.  Thickened TAVR valve with prosthetic valve stenosis; severe thickening of aortic valve.  Echo report states findings concerning for potential aortic valve vegetation and recommending to consider TEE.   Only 1 set of blood cultures was obtained on admission.  Requesting to see if additional set can be obtained on 02/13/2024 but has already had antibiotics.   Cardiology was consulted on admission as well due to elevated troponin, BNP.  Echo findings resulted after consultation request.  Interval History:  She was resting comfortably when seen this morning in the ER.  Daughter was present bedside.  Denied any further chest pain but did have some over the weekend. Reviewed findings of workup thus far  and plan for antibiotics, diuresis, and cardiology evaluation.  Assessment and Plan: * Acute hypoxic respiratory failure (HCC) - Not on oxygen at baseline - Possibly mixed etiology from underlying pulmonary edema and/or infiltrates - Continue oxygen as needed and wean as able  Elevated troponin - Suspecting demand ischemia in setting of RVR but also concerned for underlying ischemic etiology with uptrending troponin -Cardiology consulted to help further evaluate - Continue trending to peak -Did have chest pain over the weekend but currently chest pain-free when seen this morning  Sepsis (HCC) - Tachycardia, tachypnea, leukocytosis.  Suspected pulmonary source - Continue Rocephin and azithromycin.  Monitor for any drug reactions to Rocephin -Follow-up blood cultures.  Only 1 set drawn on admission prior to antibiotics  Pulmonary edema - Concern for etiology from moderate to severe mitral valve regurg -Follow-up cardiology evaluation - Continue diuresis as pressure can tolerate  S/P TAVR (transcatheter aortic valve replacement) - echo noting "TAVR valve is thickened and gradients are severely elevated consistent  with prosthetic valve stenosis" and "The aortic valve has been repaired/replaced. There is  severe calcifcation of the aortic  valve. There is severe thickening of the aortic valve. Aortic valve regurgitation is moderate to severe" - potential concern for vegetation of AV; will await cardiology evaluation to see if need more evaluation with TEE  PAF (paroxysmal atrial fibrillation) (HCC) - On Xarelto  at home and lopressor  as well - now developing RVR with soft BP; appreciate cardiology evaluation; starting cardizem  gtt for now  Malignant neoplasm of head of pancreas (HCC) - follows with oncology,  Dr. Maryalice Williamson - diagnosed 10/16/23 with EUS confirming adenocarcinoma  - Did not tolerate gemcitabine /Abraxane ; now on only gemcitabine .  Completed day 8 cycle 4 on 02/12/2024 - oncology  aware of admission   History of GI bleed - Continue Protonix   History of stroke - on xarelto  and zocor   HTN (hypertension) - adjusting regimen for now  DMII (diabetes mellitus, type 2) (HCC) - A1c 7.1% on 01/13/24 - Continue SSI and CBG monitoring   Old records reviewed in assessment of this patient  Antimicrobials: Levaquin  02/13/2024 x 1 Rocephin 02/13/2024 >> current Azithromycin 02/14/2024 >> current  DVT prophylaxis:   rivaroxaban  (XARELTO ) tablet 20 mg   Code Status:   Code Status: Limited: Do not attempt resuscitation (DNR) -DNR-LIMITED -Do Not Intubate/DNI   Mobility Assessment (Last 72 Hours)     Mobility Assessment   No documentation.           Barriers to discharge: none Disposition Plan:  Home HH orders placed: n/a Status is: Inpt  Objective: Blood pressure (!) 121/58, pulse 65, temperature 98 F (36.7 C), temperature source Oral, resp. rate 20, SpO2 100%.  Examination:  Physical Exam Constitutional:      Appearance: Normal appearance.  HENT:     Head: Normocephalic and atraumatic.     Mouth/Throat:     Mouth: Mucous membranes are moist.  Eyes:     Extraocular Movements: Extraocular movements intact.  Cardiovascular:     Rate and Rhythm: Normal rate.     Heart sounds: Murmur heard.  Pulmonary:     Effort: Pulmonary effort is normal. No respiratory distress.     Breath sounds: Rhonchi and rales present. No wheezing.  Abdominal:     General: Bowel sounds are normal. There is no distension.     Palpations: Abdomen is soft.     Tenderness: There is no abdominal tenderness.  Musculoskeletal:        General: Normal range of motion.     Cervical back: Normal range of motion and neck supple.     Right lower leg: Edema (trace) present.     Left lower leg: Edema (trace) present.  Skin:    General: Skin is warm and dry.  Neurological:     General: No focal deficit present.     Mental Status: She is alert.  Psychiatric:        Mood and Affect:  Mood normal.      Consultants:  Cardiology  Procedures:    Data Reviewed: Results for orders placed or performed during the hospital encounter of 02/12/24 (from the past 24 hours)  Comprehensive metabolic panel     Status: Abnormal   Collection Time: 02/12/24  9:44 PM  Result Value Ref Range   Sodium 134 (L) 135 - 145 mmol/L   Potassium 4.2 3.5 - 5.1 mmol/L   Chloride 102 98 - 111 mmol/L   CO2 22 22 - 32 mmol/L   Glucose, Bld 260 (H) 70 - 99 mg/dL   BUN 9 8 - 23 mg/dL   Creatinine, Ser 9.62 0.44 - 1.00 mg/dL   Calcium  9.5 8.9 - 10.3 mg/dL   Total Protein 8.1 6.5 - 8.1 g/dL   Albumin 3.1 (L) 3.5 - 5.0 g/dL   AST 29 15 - 41 U/L   ALT 21 0 - 44 U/L   Alkaline Phosphatase 73 38 - 126 U/L   Total Bilirubin 0.5 0.0 - 1.2 mg/dL   GFR, Estimated >95 >28 mL/min   Anion gap 10 5 -  15  CBC with Differential     Status: Abnormal   Collection Time: 02/12/24  9:44 PM  Result Value Ref Range   WBC 12.0 (H) 4.0 - 10.5 K/uL   RBC 4.55 3.87 - 5.11 MIL/uL   Hemoglobin 11.1 (L) 12.0 - 15.0 g/dL   HCT 40.9 81.1 - 91.4 %   MCV 81.5 80.0 - 100.0 fL   MCH 24.4 (L) 26.0 - 34.0 pg   MCHC 29.9 (L) 30.0 - 36.0 g/dL   RDW 78.2 (H) 95.6 - 21.3 %   Platelets 279 150 - 400 K/uL   nRBC 0.0 0.0 - 0.2 %   Neutrophils Relative % 63 %   Neutro Abs 7.5 1.7 - 7.7 K/uL   Lymphocytes Relative 33 %   Lymphs Abs 4.0 0.7 - 4.0 K/uL   Monocytes Relative 4 %   Monocytes Absolute 0.5 0.1 - 1.0 K/uL   Eosinophils Relative 0 %   Eosinophils Absolute 0.0 0.0 - 0.5 K/uL   Basophils Relative 0 %   Basophils Absolute 0.0 0.0 - 0.1 K/uL   Immature Granulocytes 0 %   Abs Immature Granulocytes 0.03 0.00 - 0.07 K/uL  Magnesium      Status: Abnormal   Collection Time: 02/12/24  9:44 PM  Result Value Ref Range   Magnesium  1.5 (L) 1.7 - 2.4 mg/dL  Brain natriuretic peptide     Status: Abnormal   Collection Time: 02/12/24  9:44 PM  Result Value Ref Range   B Natriuretic Peptide 815.7 (H) 0.0 - 100.0 pg/mL   Troponin I (High Sensitivity)     Status: Abnormal   Collection Time: 02/12/24  9:44 PM  Result Value Ref Range   Troponin I (High Sensitivity) 32 (H) <18 ng/L  I-Stat venous blood gas, ED     Status: Abnormal   Collection Time: 02/12/24 10:03 PM  Result Value Ref Range   pH, Ven 7.257 7.25 - 7.43   pCO2, Ven 54.6 44 - 60 mmHg   pO2, Ven 170 (H) 32 - 45 mmHg   Bicarbonate 24.3 20.0 - 28.0 mmol/L   TCO2 26 22 - 32 mmol/L   O2 Saturation 99 %   Acid-base deficit 3.0 (H) 0.0 - 2.0 mmol/L   Sodium 136 135 - 145 mmol/L   Potassium 4.4 3.5 - 5.1 mmol/L   Calcium , Ion 1.25 1.15 - 1.40 mmol/L   HCT 37.0 36.0 - 46.0 %   Hemoglobin 12.6 12.0 - 15.0 g/dL   Sample type VENOUS   Culture, blood (routine x 2)     Status: None (Preliminary result)   Collection Time: 02/12/24 11:36 PM   Specimen: BLOOD RIGHT HAND  Result Value Ref Range   Specimen Description BLOOD RIGHT HAND    Special Requests      BOTTLES DRAWN AEROBIC ONLY Blood Culture results may not be optimal due to an inadequate volume of blood received in culture bottles   Culture      NO GROWTH < 12 HOURS Performed at [facility], [address], [place], [place] [address]    Report Status PENDING   Troponin I (High Sensitivity)     Status: Abnormal   Collection Time: 02/13/24 12:07 AM  Result Value Ref Range   Troponin I (High Sensitivity) 82 (H) <18 ng/L  MRSA Next Gen by PCR, Nasal     Status: None   Collection Time: 02/13/24 12:20 AM   Specimen: Nasal Mucosa; Nasal Swab  Result Value Ref Range  MRSA by PCR Next Gen NOT DETECTED NOT DETECTED  Resp panel by RT-PCR (RSV, Flu A&B, Covid) Nasal Mucosa     Status: None   Collection Time: 02/13/24 12:30 AM   Specimen: Nasal Mucosa; Nasal Swab  Result Value Ref Range   SARS Coronavirus 2 by RT PCR NEGATIVE NEGATIVE   Influenza A by PCR NEGATIVE NEGATIVE   Influenza B by PCR NEGATIVE NEGATIVE   Resp Syncytial Virus by PCR NEGATIVE NEGATIVE  Lactic acid, plasma      Status: None   Collection Time: 02/13/24  1:05 AM  Result Value Ref Range   Lactic Acid, Venous 1.5 0.5 - 1.9 mmol/L  CBG monitoring, ED     Status: Abnormal   Collection Time: 02/13/24  1:41 AM  Result Value Ref Range   Glucose-Capillary 204 (H) 70 - 99 mg/dL  Troponin I (High Sensitivity)     Status: Abnormal   Collection Time: 02/13/24  5:05 AM  Result Value Ref Range   Troponin I (High Sensitivity) 98 (H) <18 ng/L  Basic metabolic panel with GFR     Status: Abnormal   Collection Time: 02/13/24  5:07 AM  Result Value Ref Range   Sodium 135 135 - 145 mmol/L   Potassium 3.8 3.5 - 5.1 mmol/L   Chloride 99 98 - 111 mmol/L   CO2 26 22 - 32 mmol/L   Glucose, Bld 100 (H) 70 - 99 mg/dL   BUN 10 8 - 23 mg/dL   Creatinine, Ser 1.61 0.44 - 1.00 mg/dL   Calcium  9.2 8.9 - 10.3 mg/dL   GFR, Estimated >09 >60 mL/min   Anion gap 10 5 - 15  CBC     Status: Abnormal   Collection Time: 02/13/24  5:07 AM  Result Value Ref Range   WBC 11.0 (H) 4.0 - 10.5 K/uL   RBC 3.76 (L) 3.87 - 5.11 MIL/uL   Hemoglobin 9.3 (L) 12.0 - 15.0 g/dL   HCT 45.4 (L) 09.8 - 11.9 %   MCV 78.7 (L) 80.0 - 100.0 fL   MCH 24.7 (L) 26.0 - 34.0 pg   MCHC 31.4 30.0 - 36.0 g/dL   RDW 14.7 (H) 82.9 - 56.2 %   Platelets 222 150 - 400 K/uL   nRBC 0.2 0.0 - 0.2 %  Magnesium      Status: None   Collection Time: 02/13/24  5:07 AM  Result Value Ref Range   Magnesium  1.7 1.7 - 2.4 mg/dL  Phosphorus     Status: None   Collection Time: 02/13/24  5:07 AM  Result Value Ref Range   Phosphorus 3.6 2.5 - 4.6 mg/dL  Procalcitonin     Status: None   Collection Time: 02/13/24  5:07 AM  Result Value Ref Range   Procalcitonin 3.13 ng/mL  I-Stat venous blood gas, ([facility], [facility], [facility])     Status: Abnormal   Collection Time: 02/13/24  5:21 AM  Result Value Ref Range   pH, Ven 7.417 7.25 - 7.43   pCO2, Ven 47.7 44 - 60 mmHg   pO2, Ven 115 (H) 32 - 45 mmHg   Bicarbonate 30.7 (H) 20.0 - 28.0 mmol/L   TCO2 32 22 - 32 mmol/L   O2  Saturation 99 %   Acid-Base Excess 5.0 (H) 0.0 - 2.0 mmol/L   Sodium 136 135 - 145 mmol/L   Potassium 3.8 3.5 - 5.1 mmol/L   Calcium , Ion 1.23 1.15 - 1.40 mmol/L   HCT 29.0 (L) 36.0 -  46.0 %   Hemoglobin 9.9 (L) 12.0 - 15.0 g/dL   Sample type VENOUS   CBG monitoring, ED     Status: None   Collection Time: 02/13/24  7:54 AM  Result Value Ref Range   Glucose-Capillary 98 70 - 99 mg/dL  Troponin I (High Sensitivity)     Status: Abnormal   Collection Time: 02/13/24 11:00 AM  Result Value Ref Range   Troponin I (High Sensitivity) 60 (H) <18 ng/L  Glucose, capillary     Status: Abnormal   Collection Time: 02/13/24  1:10 PM  Result Value Ref Range   Glucose-Capillary 111 (H) 70 - 99 mg/dL    I have reviewed pertinent nursing notes, vitals, labs, and images as necessary. I have ordered labwork to follow up on as indicated.  I have reviewed the last notes from staff over past 24 hours. I have discussed patient's care plan and test results with nursing staff, CM/SW, and other staff as appropriate.  Time spent: Greater than 50% of the 55 minute visit was spent in counseling/coordination of care for the patient as laid out in the A&P.   LOS: 0 days   Faith Homes, MD [facility] 02/13/2024, 2:10 PM

## 2024-02-13 NOTE — Assessment & Plan Note (Addendum)
-   On Xarelto  at home and lopressor  as well - Developed RVR with soft BP; appreciate cardiology evaluation; was started on Cardizem  drip on 02/13/2024 -Has converted back to NSR.  Now continued on Lopressor  and Xarelto .  Potential to consider amiodarone if recurrent RVR and soft blood pressures going forward

## 2024-02-13 NOTE — Assessment & Plan Note (Signed)
-   on xarelto  and zocor

## 2024-02-14 ENCOUNTER — Encounter (HOSPITAL_COMMUNITY): Payer: Self-pay | Admitting: Internal Medicine

## 2024-02-14 DIAGNOSIS — R7989 Other specified abnormal findings of blood chemistry: Secondary | ICD-10-CM | POA: Diagnosis not present

## 2024-02-14 DIAGNOSIS — A419 Sepsis, unspecified organism: Secondary | ICD-10-CM | POA: Diagnosis not present

## 2024-02-14 DIAGNOSIS — J810 Acute pulmonary edema: Secondary | ICD-10-CM | POA: Diagnosis not present

## 2024-02-14 DIAGNOSIS — I350 Nonrheumatic aortic (valve) stenosis: Secondary | ICD-10-CM | POA: Diagnosis not present

## 2024-02-14 DIAGNOSIS — J9601 Acute respiratory failure with hypoxia: Secondary | ICD-10-CM | POA: Diagnosis not present

## 2024-02-14 DIAGNOSIS — I339 Acute and subacute endocarditis, unspecified: Secondary | ICD-10-CM | POA: Diagnosis not present

## 2024-02-14 DIAGNOSIS — I480 Paroxysmal atrial fibrillation: Secondary | ICD-10-CM | POA: Diagnosis not present

## 2024-02-14 DIAGNOSIS — Z952 Presence of prosthetic heart valve: Secondary | ICD-10-CM | POA: Diagnosis not present

## 2024-02-14 LAB — CBC WITH DIFFERENTIAL/PLATELET
Abs Immature Granulocytes: 0.02 10*3/uL (ref 0.00–0.07)
Basophils Absolute: 0 10*3/uL (ref 0.0–0.1)
Basophils Relative: 0 %
Eosinophils Absolute: 0.1 10*3/uL (ref 0.0–0.5)
Eosinophils Relative: 1 %
HCT: 26.9 % — ABNORMAL LOW (ref 36.0–46.0)
Hemoglobin: 8.4 g/dL — ABNORMAL LOW (ref 12.0–15.0)
Immature Granulocytes: 0 %
Lymphocytes Relative: 34 %
Lymphs Abs: 2 10*3/uL (ref 0.7–4.0)
MCH: 24.6 pg — ABNORMAL LOW (ref 26.0–34.0)
MCHC: 31.2 g/dL (ref 30.0–36.0)
MCV: 78.7 fL — ABNORMAL LOW (ref 80.0–100.0)
Monocytes Absolute: 0.2 10*3/uL (ref 0.1–1.0)
Monocytes Relative: 3 %
Neutro Abs: 3.8 10*3/uL (ref 1.7–7.7)
Neutrophils Relative %: 62 %
Platelets: 171 10*3/uL (ref 150–400)
RBC: 3.42 MIL/uL — ABNORMAL LOW (ref 3.87–5.11)
RDW: 18.4 % — ABNORMAL HIGH (ref 11.5–15.5)
WBC: 6.1 10*3/uL (ref 4.0–10.5)
nRBC: 0 % (ref 0.0–0.2)

## 2024-02-14 LAB — GLUCOSE, CAPILLARY
Glucose-Capillary: 119 mg/dL — ABNORMAL HIGH (ref 70–99)
Glucose-Capillary: 155 mg/dL — ABNORMAL HIGH (ref 70–99)
Glucose-Capillary: 138 mg/dL — ABNORMAL HIGH (ref 70–99)

## 2024-02-14 LAB — BASIC METABOLIC PANEL WITH GFR
Anion gap: 7 (ref 5–15)
BUN: 9 mg/dL (ref 8–23)
CO2: 29 mmol/L (ref 22–32)
Calcium: 9.1 mg/dL (ref 8.9–10.3)
Chloride: 99 mmol/L (ref 98–111)
Creatinine, Ser: 0.79 mg/dL (ref 0.44–1.00)
GFR, Estimated: >60 mL/min (ref >60)
Glucose, Bld: 127 mg/dL — ABNORMAL HIGH (ref 70–99)
Potassium: 3.5 mmol/L (ref 3.5–5.1)
Sodium: 135 mmol/L (ref 135–145)

## 2024-02-14 LAB — PROCALCITONIN: Procalcitonin: 3.42 ng/mL

## 2024-02-14 LAB — MAGNESIUM: Magnesium: 1.6 mg/dL — ABNORMAL LOW (ref 1.7–2.4)

## 2024-02-14 MED ORDER — VANCOMYCIN HCL 1500 MG/300ML IV SOLN
1500.0000 mg | Freq: Once | INTRAVENOUS | Status: AC
  Administered 2024-02-14: 1500 mg via INTRAVENOUS
  Filled 2024-02-14: qty 300

## 2024-02-14 MED ORDER — MAGNESIUM SULFATE 2 GM/50ML IV SOLN
2.0000 g | Freq: Once | INTRAVENOUS | Status: AC
  Administered 2024-02-14: 2 g via INTRAVENOUS
  Filled 2024-02-14: qty 50

## 2024-02-14 MED ORDER — VANCOMYCIN HCL IN DEXTROSE 1-5 GM/200ML-% IV SOLN
1000.0000 mg | INTRAVENOUS | Status: DC
  Administered 2024-02-15 – 2024-02-16 (×2): 1000 mg via INTRAVENOUS
  Filled 2024-02-14 (×4): qty 200

## 2024-02-14 MED ORDER — ACETAMINOPHEN 325 MG PO TABS
650.0000 mg | ORAL_TABLET | Freq: Four times a day (QID) | ORAL | Status: DC | PRN
  Administered 2024-02-15: 650 mg via ORAL
  Filled 2024-02-14 (×2): qty 2

## 2024-02-14 MED ORDER — POTASSIUM CHLORIDE CRYS ER 20 MEQ PO TBCR
40.0000 meq | EXTENDED_RELEASE_TABLET | Freq: Once | ORAL | Status: AC
  Administered 2024-02-14: 40 meq via ORAL
  Filled 2024-02-14: qty 2

## 2024-02-14 NOTE — Progress Notes (Signed)
 Pharmacy Antibiotic Note  Janet Williamson is a 83 y.o. female admitted on 02/12/2024 with SOB. Pt has hx TAVR and noted to have possible vegetation. Pharmacy has been consulted for vancomycin  dosing. Cr <1.  Plan: Vancomycin  1500mg  IV x1 then 1000mg  Iv q24h - est AUC 498  Weight: 67.9 kg (149 lb 11.1 oz)  Temp (24hrs), Avg:98.3 F (36.8 C), Min:97.9 F (36.6 C), Max:98.6 F (37 C)  Recent Labs  Lab 02/09/24 1918 02/12/24 1206 02/12/24 2144 02/13/24 0105 02/13/24 0507 02/14/24 0257  WBC 10.0 5.1 12.0*  --  11.0* 6.1  CREATININE 0.79 0.58 0.78  --  0.65 0.79  LATICACIDVEN  --   --   --  1.5  --   --     Estimated Creatinine Clearance: 49.3 mL/min (by C-G formula based on SCr of 0.79 mg/dL).    Allergies  Allergen Reactions   Ace Inhibitors Swelling and Other (See Comments)    Angioedema    Keflex [Cephalexin] Swelling and Other (See Comments)    Lips became swollen   Rifadin [Rifampin] Swelling and Other (See Comments)    Lips became swollen     Levin Reamer, PharmD, BCPS, [facility] Clinical Pharmacist [phone] Please check AMION for all [facility] Pharmacy numbers 02/14/2024

## 2024-02-14 NOTE — Progress Notes (Addendum)
 Patient Name: Janet Williamson Date of Encounter: 02/14/2024 [facility] Cardiologist: Dorothye Gathers, MD   Interval Summary  .    Patient feeling well this morning. Breathing is much improved. No more palpitations since converting to NSR.  Vital Signs .    Vitals:   02/13/24 2030 02/14/24 0000 02/14/24 0200 02/14/24 0444  BP: (!) 110/44 (!) 111/55 96/76 (!) 109/47  Pulse: 70 64 (!) 58 73  Resp: 17 16 (!) 25 16  Temp:  98.4 F (36.9 C)  98.4 F (36.9 C)  TempSrc:  Oral  Oral  SpO2: 100% 100% 100% 100%  Weight:    67.9 kg    Intake/Output Summary (Last 24 hours) at 02/14/2024 0659 Last data filed at 02/13/2024 2200 Gross per 24 hour  Intake 126.95 ml  Output --  Net 126.95 ml      02/14/2024    4:44 AM 02/12/2024   12:42 PM 02/05/2024    1:06 PM  Last 3 Weights  Weight (lbs) 149 lb 11.1 oz 154 lb 4 oz 151 lb 3 oz  Weight (kg) 67.9 kg 69.967 kg 68.578 kg      Telemetry/ECG    Sinus rhythm since conversion to NSR around 1730 yesterday - Personally Reviewed  Physical Exam .   GEN: No acute distress.   Neck: No JVD Cardiac: RRR, 4/6 systolic murmur over RUSB Respiratory: Clear to auscultation bilaterally. GI: Soft, nontender, non-distended  MS: No edema  Assessment & Plan .    Janet Williamson is a 83 y.o. female with a hx of pancreatic adenocarcinoma (metastatic?) on chemotherapy, s/p biliary stent, paroxysmal atrial fibrillation, aortic valve dysfunction s/p TAVR 2019, prior stroke, DM, hypertension, GI bleeding who is being seen for the evaluation of elevated troponin in the setting of acute hypoxic respiratory failure at the request of Dr. Gladstone Lamer.    Acute hypoxic respiratory failure Patient presented to the ED with EMS on CPAP due to acute onset dyspnea while at home. Initially treated with BiPAP, is now on [place] at 4LPM. BNP 815, WBC 12. CXR with diffuse airspace opacities bilaterally, felt consistent with pulmonary edema. Procalcitonin 3.13. BC no growth to  date.  Acute HFpEF S/P TAVR 23mm [place] Endocarditis? Last echo from 2020 with normally functioning TAVR with elevated mean gradient of 30 mm Hg (previously 24 mm Hg). Echo this admission concerning for decreased valve function. Mean gradient now , peak gradient 97.38mmHg with VTI 1.07cm2. LVEF 55-60%, moderate concentric LVH. Concern of aortic valve vegetation.  Remain concerned regarding significantly increased AV gradients and possible endocarditis noted on TTE. Discuss TEE with Dr. Abel Hoe today but do not believe this will be beneficial since not likely candidate for valve replacement with pancreatic cancer. Continue cautious diuresis to protect preload ABX per primary team, consider ID consult.  Paroxysmal atrial fibrillation Patient with hx pAF since 2018. Initially on Xarelto  but this was discontinued in 2019 due to acute anemia (HGB 5.2) 2/2 GI bleeding and not resumed with ongoing assessment that risks>benefit. Patient found to be in afib with RVR in the ED on 5/4, was started back on Xarelto  20mg  daily and given Rx for toprol  xl. Initially sinus rhythm this admission but did develop afib with RVR on the afternoon of 5/8. Patient given Diltiazem  bolus 10mg  and placed on infusion. She had spontaneous conversion to NSR around 6:30pm.  Patient back in sinus rhythm this morning. Rate control strategy limited by soft BP. Continue Lopressor  50mg  BID. Would need to  consider Amiodarone with recurrent RVR and soft BP.  Continue Xarelto  20mg   Elevated troponin Type II MI HS troponin 32->82->98 in the setting of acute hypoxic respiratory failure. Patient without chest pain. Lab consistent with demand ischemia, continue to treat respiratory failure/infection.  Hypertension Patient with normal/soft BP overnight and this morning.  Hold home antihypertensive agents.  For questions or updates, please contact [facility] Please consult [email or web address] for contact info under         Signed, Leala Prince, PA-C   I have personally seen and examined this patient. I agree with the assessment and plan as outlined above.  83 yo female with history of severe AS s/p TAVR in 2019, PAF, DM, HTN, GI bleeding and pancreatic adenocarcinoma who is admitted with acute dyspnea and found to have pulmonary edema, rapid atrial fib. She is now in sinus. She has been diuresed and is feeling much better. Echo with high gradients across the AVR consistent with severe stenosis and also moderate regurgitation.  No complaints this am.  Labs reviewed by me Tele with sinus Plan:  Given her advanced age and diagnosis of pancreatic cancer, I would no plan any further invasive cardiac testing or procedures. Her AS/AI is most likely due to endocarditis, however, cannot exclude degeneration of the valve leaflets (Usually this does not happen for 10-12 years post valve placement).  I would not plan a TEE or cardiac CT to look further into the etiology since she is not a candidate for repeat TAVR or balloon valvuloplasty given her cancer diagnosis. She is now on anti-coagulation so if her AS/AI were due to leaflet thrombosis (HALT/HAM), Xarelto  would be the treatment for that.  Suggest ID consult for antibiotic regimen felt to be most effective for possible endocarditis in this patient with pancreatic adenocarcinoma and biliary stent.   Antoinette Batman, MD, [facility] 02/14/2024 9:18 AM

## 2024-02-14 NOTE — Progress Notes (Signed)
 Progress Note    Janet Williamson   WGN:[id number]  DOB: 18-Nov-1940  DOA: 02/12/2024     1 PCP: Benedetto Brady, MD  Initial CC: SOB  Hospital Course: Janet Williamson is an 83 year old female with pancreatic adenocarcinoma on gemcitabine  with questionable mets currently being followed by oncology.  Also PMH of TAVR, PAF, CVA, diabetes, HTN, GI bleed who presented with dyspnea and two-pillow orthopnea.  She also endorsed some swelling in her legs after standing but states that they improve after laying in the bed flat. Over the weekend had developed some cough but denied any sputum production.  Her symptoms of shortness of breath and leg swelling had been going on for approximately 1 month. She did endorse some chest pain over the weekend as well.  She just received day 8 cycle 4 of gemcitabine  on 02/12/2024 (day 1 was on 4/30).    Notable labs included: Procalcitonin 3.13 WBC 12 on admission Troponin steadily uptrending (32 >>82>>98) BNP 815   CXR showed concern for diffuse airspace opacities possibly pulmonary edema.   Given her cough, leukocytosis, and procalcitonin, she was also started on antibiotics for pneumonia coverage.   Echo was also obtained which showed concern for moderate to severe MR.  Thickened TAVR valve with prosthetic valve stenosis; severe thickening of aortic valve.  Echo report states findings concerning for potential aortic valve vegetation and recommending to consider TEE.   Only 1 set of blood cultures was obtained on admission.  Requesting to see if additional set can be obtained on 02/13/2024 but has already had antibiotics.   Cardiology was consulted on admission as well due to elevated troponin, BNP.  Echo findings resulted after consultation request.  Interval History:  Dyspnea and breathing is much better when seen this morning.  Resting comfortably in bed.  Denies any palpitations or chest pain. Family present bedside and reviewed findings thus far and further  workup.  Assessment and Plan: * Acute hypoxic respiratory failure (HCC) - Not on oxygen at baseline - Possibly mixed etiology from underlying pulmonary edema and/or infiltrates - Continue oxygen as needed and wean as able  Sepsis (HCC) - Tachycardia, tachypnea, leukocytosis.  Suspected pulmonary source on admission - Possibility of endocarditis given echo findings of aortic valve, see below - Continue Rocephin  and azithromycin . Tolerating well -Follow-up blood cultures.  Only 1 set drawn on admission prior to antibiotics - All cultures remaining negative so far  Pulmonary edema - Concern for etiology from moderate to severe mitral valve regurg and diseased AV - Appreciate cardiology evaluation - Continue diuresis as pressure can tolerate  Elevated troponin-resolved as of 02/14/2024 - Suspecting demand ischemia in setting of RVR but also concerned for underlying ischemic etiology with uptrending troponin -Cardiology consulted to help further evaluate; agreed with demand ischemia -Troponin peaked at 98 - no further workup at this time per cardiology  S/P TAVR (transcatheter aortic valve replacement) - echo noting "TAVR valve is thickened and gradients are severely elevated consistent  with prosthetic valve stenosis" and "The aortic valve has been repaired/replaced. There is  severe calcifcation of the aortic  valve. There is severe thickening of the aortic valve. Aortic valve regurgitation is moderate to severe" - potential concern for vegetation of AV; cardiology has evaluated and due to not a valve replacement candidate in setting of pancreatic cancer, TEE was not recommended -Consulting ID for further assistance regarding antibiotic regimen and if TEE still needed   PAF (paroxysmal atrial fibrillation) (HCC) - On Xarelto  at home  and lopressor  as well - Developed RVR with soft BP; appreciate cardiology evaluation; was started on Cardizem  drip on 02/13/2024 -Has converted back to NSR.   Now continued on Lopressor  and Xarelto .  Potential to consider amiodarone if recurrent RVR and soft blood pressures going forward  Malignant neoplasm of head of pancreas [facility]) - follows with oncology, Dr. Maryalice Williamson - diagnosed 10/16/23 with EUS confirming adenocarcinoma  - Did not tolerate gemcitabine /Abraxane ; now on only gemcitabine .  Completed day 8 cycle 4 on 02/12/2024 - oncology aware of admission   History of GI bleed - Continue Protonix   History of stroke - on xarelto  and zocor   HTN (hypertension) - adjusting regimen for now  DMII (diabetes mellitus, type 2) (HCC) - A1c 7.1% on 01/13/24 - Continue SSI and CBG monitoring   Old records reviewed in assessment of this patient  Antimicrobials: Levaquin  02/13/2024 x 1 Rocephin  02/13/2024 >> current Azithromycin  02/14/2024 >> current Vancomycin  02/14/2024 >> current  DVT prophylaxis:   rivaroxaban  (XARELTO ) tablet 20 mg   Code Status:   Code Status: Limited: Do not attempt resuscitation (DNR) -DNR-LIMITED -Do Not Intubate/DNI   Mobility Assessment (Last 72 Hours)     Mobility Assessment     Row Name 02/14/24 0745 02/13/24 2200 02/13/24 1546 02/13/24 1245     Does patient have an order for bedrest or is patient medically unstable No - Continue assessment No - Continue assessment No - Continue assessment No - Continue assessment    What is the highest level of mobility based on the progressive mobility assessment? Level 1 (Bedfast) - Unable to balance while sitting on edge of bed Level 2 (Chairfast) - Balance while sitting on edge of bed and cannot stand Level 2 (Chairfast) - Balance while sitting on edge of bed and cannot stand Level 2 (Chairfast) - Balance while sitting on edge of bed and cannot stand    Is the above level different from baseline mobility prior to current illness? Yes - Recommend PT order Yes - Recommend PT order Yes - Recommend PT order Yes - Recommend PT order             Barriers to discharge: none Disposition  Plan:  Home HH orders placed: n/a Status is: Inpt  Objective: Blood pressure (!) 123/51, pulse 63, temperature 98.2 F (36.8 C), temperature source Oral, resp. rate 16, weight 67.9 kg, SpO2 100%.  Examination:  Physical Exam Constitutional:      Appearance: Normal appearance.  HENT:     Head: Normocephalic and atraumatic.     Mouth/Throat:     Mouth: Mucous membranes are moist.  Eyes:     Extraocular Movements: Extraocular movements intact.  Cardiovascular:     Rate and Rhythm: Normal rate.     Heart sounds: Murmur heard.  Pulmonary:     Effort: Pulmonary effort is normal. No respiratory distress.     Breath sounds: Rhonchi and rales present. No wheezing.  Abdominal:     General: Bowel sounds are normal. There is no distension.     Palpations: Abdomen is soft.     Tenderness: There is no abdominal tenderness.  Musculoskeletal:        General: Normal range of motion.     Cervical back: Normal range of motion and neck supple.     Right lower leg: Edema (trace) present.     Left lower leg: Edema (trace) present.  Skin:    General: Skin is warm and dry.  Neurological:     General:  No focal deficit present.     Mental Status: She is alert.  Psychiatric:        Mood and Affect: Mood normal.      Consultants:  Cardiology ID  Procedures:    Data Reviewed: Results for orders placed or performed during the hospital encounter of 02/12/24 (from the past 24 hours)  Glucose, capillary     Status: Abnormal   Collection Time: 02/13/24  4:19 PM  Result Value Ref Range   Glucose-Capillary 150 (H) 70 - 99 mg/dL  Culture, blood (Routine X 2) w Reflex to ID Panel     Status: None (Preliminary result)   Collection Time: 02/13/24  4:33 PM   Specimen: BLOOD LEFT HAND  Result Value Ref Range   Specimen Description BLOOD LEFT HAND    Special Requests      BOTTLES DRAWN AEROBIC AND ANAEROBIC Blood Culture adequate volume   Culture      NO GROWTH < 24 HOURS Performed at [facility], [address], [place], [place] [address]    Report Status PENDING   Culture, blood (Routine X 2) w Reflex to ID Panel     Status: None (Preliminary result)   Collection Time: 02/13/24  4:34 PM   Specimen: BLOOD  Result Value Ref Range   Specimen Description BLOOD SITE NOT SPECIFIED    Special Requests      BOTTLES DRAWN AEROBIC AND ANAEROBIC Blood Culture results may not be optimal due to an inadequate volume of blood received in culture bottles   Culture      NO GROWTH < 24 HOURS Performed at [facility], [address], [place], [place] [address]    Report Status PENDING   Glucose, capillary     Status: Abnormal   Collection Time: 02/13/24  9:25 PM  Result Value Ref Range   Glucose-Capillary 158 (H) 70 - 99 mg/dL   Comment 1 Notify RN   Troponin I (High Sensitivity)     Status: Abnormal   Collection Time: 02/13/24 11:06 PM  Result Value Ref Range   Troponin I (High Sensitivity) 41 (H) <18 ng/L  Basic metabolic panel with GFR     Status: Abnormal   Collection Time: 02/14/24  2:57 AM  Result Value Ref Range   Sodium 135 135 - 145 mmol/L   Potassium 3.5 3.5 - 5.1 mmol/L   Chloride 99 98 - 111 mmol/L   CO2 29 22 - 32 mmol/L   Glucose, Bld 127 (H) 70 - 99 mg/dL   BUN 9 8 - 23 mg/dL   Creatinine, Ser 1.47 0.44 - 1.00 mg/dL   Calcium  9.1 8.9 - 10.3 mg/dL   GFR, Estimated >82 >95 mL/min   Anion gap 7 5 - 15  CBC with Differential/Platelet     Status: Abnormal   Collection Time: 02/14/24  2:57 AM  Result Value Ref Range   WBC 6.1 4.0 - 10.5 K/uL   RBC 3.42 (L) 3.87 - 5.11 MIL/uL   Hemoglobin 8.4 (L) 12.0 - 15.0 g/dL   HCT 62.1 (L) 30.8 - 65.7 %   MCV 78.7 (L) 80.0 - 100.0 fL   MCH 24.6 (L) 26.0 - 34.0 pg   MCHC 31.2 30.0 - 36.0 g/dL   RDW 84.6 (H) 96.2 - 95.2 %   Platelets 171 150 - 400 K/uL   nRBC 0.0 0.0 - 0.2 %   Neutrophils Relative % 62 %   Neutro Abs 3.8 1.7 -  7.7 K/uL   Lymphocytes Relative 34 %   Lymphs Abs 2.0 0.7 - 4.0 K/uL   Monocytes  Relative 3 %   Monocytes Absolute 0.2 0.1 - 1.0 K/uL   Eosinophils Relative 1 %   Eosinophils Absolute 0.1 0.0 - 0.5 K/uL   Basophils Relative 0 %   Basophils Absolute 0.0 0.0 - 0.1 K/uL   Immature Granulocytes 0 %   Abs Immature Granulocytes 0.02 0.00 - 0.07 K/uL  Magnesium      Status: Abnormal   Collection Time: 02/14/24  2:57 AM  Result Value Ref Range   Magnesium  1.6 (L) 1.7 - 2.4 mg/dL  Procalcitonin     Status: None   Collection Time: 02/14/24  2:57 AM  Result Value Ref Range   Procalcitonin 3.42 ng/mL  Glucose, capillary     Status: Abnormal   Collection Time: 02/14/24  6:14 AM  Result Value Ref Range   Glucose-Capillary 119 (H) 70 - 99 mg/dL   Comment 1 Notify RN   Glucose, capillary     Status: Abnormal   Collection Time: 02/14/24 11:37 AM  Result Value Ref Range   Glucose-Capillary 155 (H) 70 - 99 mg/dL    I have reviewed pertinent nursing notes, vitals, labs, and images as necessary. I have ordered labwork to follow up on as indicated.  I have reviewed the last notes from staff over past 24 hours. I have discussed patient's care plan and test results with nursing staff, CM/SW, and other staff as appropriate.  Time spent: Greater than 50% of the 55 minute visit was spent in counseling/coordination of care for the patient as laid out in the A&P.   LOS: 1 day   Faith Homes, MD [facility] 02/14/2024, 3:36 PM

## 2024-02-14 NOTE — Plan of Care (Signed)
  Problem: Education: Goal: Ability to describe self-care measures that may prevent or decrease complications (Diabetes Survival Skills Education) will improve Outcome: Progressing Goal: Individualized Educational Video(s) Outcome: Progressing   Problem: Coping: Goal: Ability to adjust to condition or change in health will improve Outcome: Progressing   Problem: Metabolic: Goal: Ability to maintain appropriate glucose levels will improve Outcome: Progressing   Problem: Skin Integrity: Goal: Risk for impaired skin integrity will decrease Outcome: Progressing   Problem: Tissue Perfusion: Goal: Adequacy of tissue perfusion will improve Outcome: Progressing   Problem: Activity: Goal: Risk for activity intolerance will decrease Outcome: Progressing   Problem: Nutrition: Goal: Adequate nutrition will be maintained Outcome: Progressing   Problem: Coping: Goal: Level of anxiety will decrease Outcome: Progressing   Problem: Elimination: Goal: Will not experience complications related to bowel motility Outcome: Progressing Goal: Will not experience complications related to urinary retention Outcome: Progressing   Problem: Pain Managment: Goal: General experience of comfort will improve and/or be controlled Outcome: Progressing   Problem: Skin Integrity: Goal: Risk for impaired skin integrity will decrease Outcome: Progressing   Problem: Safety: Goal: Ability to remain free from injury will improve Outcome: Progressing

## 2024-02-14 NOTE — Progress Notes (Signed)
 Heart Failure Navigator Progress Note  Assessed for Heart & Vascular TOC clinic readiness.  Patient does not meet criteria due to history of metastatic pancreatic cancer.   Navigator available for reassessment of patient.   Jerilyn Monte, PharmD, BCPS Heart Failure Stewardship Pharmacist Phone [phone]

## 2024-02-14 NOTE — Plan of Care (Signed)
   Problem: Education: Goal: Ability to describe self-care measures that may prevent or decrease complications (Diabetes Survival Skills Education) will improve Outcome: Progressing Goal: Individualized Educational Video(s) Outcome: Progressing   Problem: Coping: Goal: Ability to adjust to condition or change in health will improve Outcome: Progressing

## 2024-02-15 ENCOUNTER — Inpatient Hospital Stay (HOSPITAL_COMMUNITY)

## 2024-02-15 DIAGNOSIS — R7989 Other specified abnormal findings of blood chemistry: Secondary | ICD-10-CM | POA: Diagnosis not present

## 2024-02-15 DIAGNOSIS — T82857A Stenosis of cardiac prosthetic devices, implants and grafts, initial encounter: Secondary | ICD-10-CM

## 2024-02-15 DIAGNOSIS — I5033 Acute on chronic diastolic (congestive) heart failure: Secondary | ICD-10-CM

## 2024-02-15 DIAGNOSIS — I480 Paroxysmal atrial fibrillation: Secondary | ICD-10-CM | POA: Diagnosis not present

## 2024-02-15 DIAGNOSIS — A419 Sepsis, unspecified organism: Secondary | ICD-10-CM | POA: Diagnosis not present

## 2024-02-15 DIAGNOSIS — J810 Acute pulmonary edema: Secondary | ICD-10-CM | POA: Diagnosis not present

## 2024-02-15 DIAGNOSIS — J9601 Acute respiratory failure with hypoxia: Secondary | ICD-10-CM | POA: Diagnosis not present

## 2024-02-15 DIAGNOSIS — Z952 Presence of prosthetic heart valve: Secondary | ICD-10-CM | POA: Diagnosis not present

## 2024-02-15 LAB — GLUCOSE, CAPILLARY
Glucose-Capillary: 114 mg/dL — ABNORMAL HIGH (ref 70–99)
Glucose-Capillary: 274 mg/dL — ABNORMAL HIGH (ref 70–99)
Glucose-Capillary: 177 mg/dL — ABNORMAL HIGH (ref 70–99)
Glucose-Capillary: 264 mg/dL — ABNORMAL HIGH (ref 70–99)

## 2024-02-15 LAB — BASIC METABOLIC PANEL WITH GFR
Anion gap: 8 (ref 5–15)
BUN: 10 mg/dL (ref 8–23)
CO2: 30 mmol/L (ref 22–32)
Calcium: 9.4 mg/dL (ref 8.9–10.3)
Chloride: 97 mmol/L — ABNORMAL LOW (ref 98–111)
Creatinine, Ser: 0.7 mg/dL (ref 0.44–1.00)
GFR, Estimated: >60 mL/min (ref >60)
Glucose, Bld: 124 mg/dL — ABNORMAL HIGH (ref 70–99)
Potassium: 3.8 mmol/L (ref 3.5–5.1)
Sodium: 135 mmol/L (ref 135–145)

## 2024-02-15 LAB — CBC WITH DIFFERENTIAL/PLATELET
Abs Immature Granulocytes: 0.05 10*3/uL (ref 0.00–0.07)
Basophils Absolute: 0 10*3/uL (ref 0.0–0.1)
Basophils Relative: 0 %
Eosinophils Absolute: 0.1 10*3/uL (ref 0.0–0.5)
Eosinophils Relative: 1 %
HCT: 28.5 % — ABNORMAL LOW (ref 36.0–46.0)
Hemoglobin: 9 g/dL — ABNORMAL LOW (ref 12.0–15.0)
Immature Granulocytes: 1 %
Lymphocytes Relative: 16 %
Lymphs Abs: 1.5 10*3/uL (ref 0.7–4.0)
MCH: 24.8 pg — ABNORMAL LOW (ref 26.0–34.0)
MCHC: 31.6 g/dL (ref 30.0–36.0)
MCV: 78.5 fL — ABNORMAL LOW (ref 80.0–100.0)
Monocytes Absolute: 0.1 10*3/uL (ref 0.1–1.0)
Monocytes Relative: 1 %
Neutro Abs: 8 10*3/uL — ABNORMAL HIGH (ref 1.7–7.7)
Neutrophils Relative %: 81 %
Platelets: 182 10*3/uL (ref 150–400)
RBC: 3.63 MIL/uL — ABNORMAL LOW (ref 3.87–5.11)
RDW: 18.4 % — ABNORMAL HIGH (ref 11.5–15.5)
WBC: 9.7 10*3/uL (ref 4.0–10.5)
nRBC: 0 % (ref 0.0–0.2)

## 2024-02-15 LAB — MAGNESIUM: Magnesium: 1.9 mg/dL (ref 1.7–2.4)

## 2024-02-15 MED ORDER — METOPROLOL TARTRATE 25 MG PO TABS
25.0000 mg | ORAL_TABLET | Freq: Two times a day (BID) | ORAL | Status: DC
  Administered 2024-02-15 – 2024-02-19 (×9): 25 mg via ORAL
  Filled 2024-02-15 (×9): qty 1

## 2024-02-15 NOTE — Evaluation (Signed)
 Physical Therapy Evaluation Patient Details Name: Janet Williamson MRN: [id number] DOB: Apr 21, 1941 Today's Date: 02/15/2024  History of Present Illness  83 y.o. female who was brought in from home 02/13/24 with acute worsening shortness of breath. BiPAP; acute HF; elevated troponin due to demand ischemia; PAF>NSR 5/9;  PMH pancreatic adenocarcinoma possibly metastatic with indeterminant liver lesion currently under investigation (currently on chemotherapy), biliary stent, paroxysmal A-fib on anticoagulation, TAVR, CVA, diabetes, hypertension, GI bleed,  Clinical Impression   Pt admitted secondary to problem above with deficits below. PTA patient lived alone and was very independent (driving and doing her grocery shopping). She used a cane the majority of the time.  Pt currently requires min assist for sit to stand and CGA for ambulation with RW x 90 ft. Her goal is to get back to using cane.  Anticipate patient will benefit from PT to address problems listed below. Will continue to follow acutely to maximize functional mobility, independence, and safety.  Recommend HHPT on discharge.          If plan is discharge home, recommend the following: Assistance with cooking/housework;Assist for transportation   Can travel by private vehicle        Equipment Recommendations Rolling walker (2 wheels) (may progress to not need RW)  Recommendations for Other Services  OT consult    Functional Status Assessment Patient has had a recent decline in their functional status and demonstrates the ability to make significant improvements in function in a reasonable and predictable amount of time.     Precautions / Restrictions Precautions Precautions: Fall Recall of Precautions/Restrictions: Intact Precaution/Restrictions Comments: watch sats; HR<110      Mobility  Bed Mobility Overal bed mobility: Needs Assistance Bed Mobility: Supine to Sit     Supine to sit: Supervision     General bed  mobility comments: for safety and lines    Transfers Overall transfer level: Needs assistance Equipment used: Rolling walker (2 wheels) Transfers: Sit to/from Stand Sit to Stand: Min assist           General transfer comment: vc for hand placement; boosting assist to initiate rise    Ambulation/Gait Ambulation/Gait assistance: Contact guard assist Gait Distance (Feet): 90 Feet Assistive device: Rolling walker (2 wheels) Gait Pattern/deviations: Step-through pattern, Decreased step length - right, Shuffle   Gait velocity interpretation: <1.8 ft/sec, indicate of risk for recurrent falls   General Gait Details: reports stiffness from arthritis making it difficult to advance RLE  Stairs            Wheelchair Mobility     Tilt Bed    Modified Rankin (Stroke Patients Only)       Balance Overall balance assessment: Needs assistance Sitting-balance support: No upper extremity supported, Feet unsupported Sitting balance-Leahy Scale: Good     Standing balance support: Bilateral upper extremity supported, During functional activity Standing balance-Leahy Scale: Poor                               Pertinent Vitals/Pain Pain Assessment Pain Assessment: No/denies pain    Home Living Family/patient expects to be discharged to:: Private residence Living Arrangements: Alone Available Help at Discharge: Family;Available PRN/intermittently Type of Home: House Home Access: Level entry       Home Layout: One level Home Equipment: Cane - single point;BSC/3in1      Prior Function Prior Level of Function : Independent/Modified Independent;Driving  Mobility Comments: using a cane most of the time; doing her own grocery shopping until just recently       Extremity/Trunk Assessment   Upper Extremity Assessment Upper Extremity Assessment: Defer to OT evaluation    Lower Extremity Assessment Lower Extremity Assessment: Generalized  weakness (use of bil UEs and min assist to stand)    Cervical / Trunk Assessment Cervical / Trunk Assessment: Normal  Communication   Communication Communication: No apparent difficulties    Cognition Arousal: Alert Behavior During Therapy: WFL for tasks assessed/performed   PT - Cognitive impairments: No apparent impairments                         Following commands: Intact       Cueing Cueing Techniques: Verbal cues     General Comments General comments (skin integrity, edema, etc.): on 2L 97% at rest; unable to get reading while walking; on return to sitting 97%; HR<100 throughout    Exercises     Assessment/Plan    PT Assessment Patient needs continued PT services  PT Problem List Decreased strength;Decreased balance;Decreased mobility;Decreased knowledge of use of DME;Cardiopulmonary status limiting activity       PT Treatment Interventions DME instruction;Gait training;Functional mobility training;Therapeutic activities;Therapeutic exercise;Balance training;Patient/family education    PT Goals (Current goals can be found in the Care Plan section)  Acute Rehab PT Goals Patient Stated Goal: walk with cane PT Goal Formulation: With patient Time For Goal Achievement: 02/29/24 Potential to Achieve Goals: Good    Frequency Min 3X/week     Co-evaluation               AM-PAC PT "6 Clicks" Mobility  Outcome Measure Help needed turning from your back to your side while in a flat bed without using bedrails?: None Help needed moving from lying on your back to sitting on the side of a flat bed without using bedrails?: A Little Help needed moving to and from a bed to a chair (including a wheelchair)?: A Little Help needed standing up from a chair using your arms (e.g., wheelchair or bedside chair)?: A Little Help needed to walk in hospital room?: A Little Help needed climbing 3-5 steps with a railing? : A Little 6 Click Score: 19    End of Session  Equipment Utilized During Treatment: Gait belt;Oxygen Activity Tolerance: Patient tolerated treatment well Patient left: in chair;with call bell/phone within reach;with chair alarm set Nurse Communication: Mobility status;Other (comment) (can call when she needs to toilet) PT Visit Diagnosis: Other abnormalities of gait and mobility (R26.89);Muscle weakness (generalized) (M62.81)    Time: 0981-1914 PT Time Calculation (min) (ACUTE ONLY): 35 min   Charges:   PT Evaluation $PT Eval Low Complexity: 1 Low PT Treatments $Gait Training: 8-22 mins PT General Charges $$ ACUTE PT VISIT: 1 Visit          Gayle Kava, PT Acute Rehabilitation Services  Office [phone]   Guilford Leep 02/15/2024, 9:49 AM

## 2024-02-15 NOTE — Evaluation (Signed)
 Occupational Therapy Evaluation Patient Details Name: Janet Williamson MRN: [id number] DOB: 07/16/1941 Today's Date: 02/15/2024   History of Present Illness   83 y.o. female who was brought in from home 02/13/24 with acute worsening shortness of breath. BiPAP; acute HF; elevated troponin due to demand ischemia; PAF>NSR 5/9;  PMH pancreatic adenocarcinoma possibly metastatic with indeterminant liver lesion currently under investigation (currently on chemotherapy), biliary stent, paroxysmal A-fib on anticoagulation, TAVR, CVA, diabetes, hypertension, GI bleed,     Clinical Impressions At baseline, pt is Independent to Mod I with ADLs, IADLs, and functional mobility with a SPC. Pt now presents with decreased activity tolerance, impaired cardiopulmonary status, decreased balance, decreased B UE strength, and decreased safety and independence with functional tasks. Pt currently demonstrates ability to complete ADLs Independent to Min assist and functional mobility/transfers with a RW with Min assist. Pt participated well in session, is motivated to return to [facility], and has good family support. Pt will benefit from acute skilled OT services to address deficits outlined below and to increase safety and independence with functional tasks. Post acute discharge, pt will benefit from [facility] OT to maximize rehab potential. OT also recommends a tub transfer bench for increased safety, independence, and comfort with bathing in the home.      If plan is discharge home, recommend the following:   A little help with walking and/or transfers;A little help with bathing/dressing/bathroom;Assistance with cooking/housework;Assist for transportation;Help with stairs or ramp for entrance     Functional Status Assessment   Patient has had a recent decline in their functional status and demonstrates the ability to make significant improvements in function in a reasonable and predictable amount of time.     Equipment  Recommendations   Tub/shower bench     Recommendations for Other Services         Precautions/Restrictions   Precautions Precautions: Fall Recall of Precautions/Restrictions: Intact Precaution/Restrictions Comments: watch sats; Target HR <110 Restrictions Weight Bearing Restrictions Per Provider Order: No     Mobility Bed Mobility Overal bed mobility: Needs Assistance Bed Mobility: Supine to Sit     Supine to sit: Supervision, HOB elevated     General bed mobility comments: for safety and lines    Transfers Overall transfer level: Needs assistance Equipment used: Rolling walker (2 wheels) Transfers: Sit to/from Stand, Bed to chair/wheelchair/BSC Sit to Stand: Min assist     Step pivot transfers: Min assist     General transfer comment: vc for hand placement; boosting assist to initiate rise, cues for safety during step-pivot transfer      Balance Overall balance assessment: Needs assistance Sitting-balance support: No upper extremity supported, Feet unsupported Sitting balance-Leahy Scale: Good     Standing balance support: Single extremity supported, Bilateral upper extremity supported, During functional activity, No upper extremity supported Standing balance-Leahy Scale: Poor (Poor+/Fair-) Standing balance comment: Pt able to briefly hold static stand without UE support. Needs UE support in prolonged static standing, dynamic standing, and stepping.                           ADL either performed or assessed with clinical judgement   ADL Overall ADL's : Needs assistance/impaired Eating/Feeding: Independent;Sitting   Grooming: Set up;Sitting   Upper Body Bathing: Set up;Sitting   Lower Body Bathing: Minimal assistance;Sit to/from stand   Upper Body Dressing : Contact guard assist;Sitting Upper Body Dressing Details (indicate cue type and reason): assist for managing telemetry cords Lower Body  Dressing: Minimal assistance;Sit to/from  stand   Toilet Transfer: Minimal assistance;Cueing for safety;BSC/3in1;Rolling walker (2 wheels);Ambulation Toilet Transfer Details (indicate cue type and reason): simulated at EOB Toileting- Clothing Manipulation and Hygiene: Minimal assistance;Sitting/lateral lean;Sit to/from stand       Functional mobility during ADLs: Minimal assistance;Rolling walker (2 wheels);Cueing for safety General ADL Comments: Pt with decreased activity tolerance during functional tasks.     Vision Baseline Vision/History: 1 Wears glasses Ability to See in Adequate Light: 0 Adequate (with glasses) Patient Visual Report: No change from baseline       Perception         Praxis         Pertinent Vitals/Pain Pain Assessment Pain Assessment: Faces Faces Pain Scale: Hurts little more Pain Location: B knees, worse on L and worse in standing Pain Descriptors / Indicators: Aching, Discomfort, Grimacing, Sore Pain Intervention(s): Limited activity within patient's tolerance, Monitored during session, Repositioned     Extremity/Trunk Assessment Upper Extremity Assessment Upper Extremity Assessment: Right hand dominant;Generalized weakness   Lower Extremity Assessment Lower Extremity Assessment: Defer to PT evaluation   Cervical / Trunk Assessment Cervical / Trunk Assessment: Normal   Communication Communication Communication: No apparent difficulties   Cognition Arousal: Alert Behavior During Therapy: WFL for tasks assessed/performed Cognition: No apparent impairments             OT - Cognition Comments: Pt AAOx4 and pleasant throughout session. Pt cognition WFL for tasks assessed. Cogntiion not formally screened or evaluated.                 Following commands: Intact       Cueing  General Comments   Cueing Techniques: Verbal cues  Pt HR in the 90s to low-100s. Pt O2 sat largely >/92% on RA throughout session with brief drop to 90% during functional mobility and pt quickly  recovering to >/92% on RA with seated rest break. Pt's daughters present throughout session.   Exercises     Shoulder Instructions      Home Living Family/patient expects to be discharged to:: Private residence Living Arrangements: Alone Available Help at Discharge: Family;Available PRN/intermittently Type of Home: House Home Access: Level entry     Home Layout: One level     Bathroom Shower/Tub: Sponge bathes at baseline;Tub/shower unit   Bathroom Toilet: Standard     Home Equipment: Cane - single point;BSC/3in1   Additional Comments: Pt reports she sponge bathes at baseline due to difficulty stepping in and out of tub due to knee pain. OT educated pt and family in options of tub transfer bench with pt and family reporting they think a tub transfer bench would be helpful for pt.      Prior Functioning/Environment Prior Level of Function : Independent/Modified Independent;Driving             Mobility Comments: using a cane most of the time; doing her own grocery shopping until just recently ADLs Comments: At baseline pt is Independent with ADLs and IADLs and drives. Pt enjoys playing games on her tablet.    OT Problem List: Decreased strength;Decreased activity tolerance;Impaired balance (sitting and/or standing);Cardiopulmonary status limiting activity   OT Treatment/Interventions: Self-care/ADL training;Therapeutic exercise;Energy conservation;DME and/or AE instruction;Therapeutic activities;Patient/family education;Balance training      OT Goals(Current goals can be found in the care plan section)   Acute Rehab OT Goals Patient Stated Goal: to have more energy and return home OT Goal Formulation: With patient Time For Goal Achievement: 02/29/24 Potential to Achieve  Goals: Good ADL Goals Pt Will Perform Grooming: with modified independence;standing Pt Will Perform Lower Body Bathing: with supervision;sitting/lateral leans;sit to/from stand Pt Will Perform  Lower Body Dressing: with modified independence;sitting/lateral leans;sit to/from stand Pt Will Transfer to Toilet: ambulating;regular height toilet;with modified independence (with least restrictive AD) Pt Will Perform Tub/Shower Transfer: Tub transfer;with supervision;ambulating;tub bench (with least restrictive AD) Additional ADL Goal #1: Patient will demonstrate ability to Independently state 3 energy conservation strategies to increase safety and independence with functional tasks.   OT Frequency:  Min 2X/week    Co-evaluation              AM-PAC OT "6 Clicks" Daily Activity     Outcome Measure Help from another person eating meals?: None Help from another person taking care of personal grooming?: A Little Help from another person toileting, which includes using toliet, bedpan, or urinal?: A Little Help from another person bathing (including washing, rinsing, drying)?: A Little Help from another person to put on and taking off regular upper body clothing?: A Little Help from another person to put on and taking off regular lower body clothing?: A Little 6 Click Score: 19   End of Session Equipment Utilized During Treatment: Gait belt;Rolling walker (2 wheels) Nurse Communication: Mobility status;Other (comment) (Pt participated well. Pt sitting EOB with family present. OT POC.)  Activity Tolerance: Patient tolerated treatment well Patient left: in bed;with call bell/phone within reach;with family/visitor present (sitting EOB)  OT Visit Diagnosis: Unsteadiness on feet (R26.81);Other abnormalities of gait and mobility (R26.89);Muscle weakness (generalized) (M62.81);Other (comment) (decreased activity tolerance.)                Time: 4696-2952 OT Time Calculation (min): 31 min Charges:  OT General Charges $OT Visit: 1 Visit OT Evaluation $OT Eval Low Complexity: 1 Low OT Treatments $Self Care/Home Management : 8-22 mins  Tanylah Schnoebelen "Darral Ellis., OTR/L, MA Acute  Rehab [phone]  Walt Gunner 02/15/2024, 6:51 PM

## 2024-02-15 NOTE — Progress Notes (Signed)
 Patient Name: Janet Williamson Date of Encounter: 02/15/2024 [facility] Cardiologist: Dorothye Gathers, MD   Interval Summary  .    BP 112/49, pulse 81, SpO2 100% on 4 L [place].  Creatinine 0.7, hemoglobin stable at 9.0.  Net negative 1.3L yesterday.  Denies chest pain or dyspnea this morning  Vital Signs .    Vitals:   02/14/24 2314 02/15/24 0313 02/15/24 0600 02/15/24 0712  BP: (!) 107/53 (!) 105/52  (!) 112/49  Pulse: 77 64  72  Resp: 20 17  16   Temp: 100.2 F (37.9 C) 98.2 F (36.8 C)  98 F (36.7 C)  TempSrc: Oral Oral  Oral  SpO2: 97% 100%  100%  Weight:   67.2 kg   Height:        Intake/Output Summary (Last 24 hours) at 02/15/2024 0756 Last data filed at 02/15/2024 0600 Gross per 24 hour  Intake 1010 ml  Output 2300 ml  Net -1290 ml      02/15/2024    6:00 AM 02/14/2024    4:44 AM 02/12/2024   12:42 PM  Last 3 Weights  Weight (lbs) 148 lb 2.4 oz 149 lb 11.1 oz 154 lb 4 oz  Weight (kg) 67.2 kg 67.9 kg 69.967 kg      Telemetry/ECG    Sinus rhythm since conversion to NSR around 1730 yesterday - Personally Reviewed  Physical Exam .   GEN: No acute distress.   Neck: +JVD Cardiac: RRR, 3/6 systolic murmur over RUSB Respiratory: Diminished breath sounds GI: Soft, nontender, non-distended  MS: No edema  Assessment & Plan .    Janet Williamson is a 83 y.o. female with a hx of pancreatic adenocarcinoma (metastatic?) on chemotherapy, s/p biliary stent, paroxysmal atrial fibrillation, aortic valve dysfunction s/p TAVR 2019, prior stroke, DM, hypertension, GI bleeding who is being seen for the evaluation of elevated troponin in the setting of acute hypoxic respiratory failure at the request of Dr. Gladstone Lamer.     Acute hypoxic respiratory failure Patient presented to the ED with EMS on CPAP due to acute onset dyspnea while at home. Initially treated with BiPAP, is now on [place] at 4LPM. BNP 815, WBC 12. CXR with diffuse airspace opacities bilaterally, felt consistent  with pulmonary edema. Procalcitonin 3.13. BC no growth to date. -Continue gentle diuresis   Acute HFpEF S/P TAVR 23mm Sapien Endocarditis? Last echo from 2020 with normally functioning TAVR with elevated mean gradient of 30 mm Hg (previously 24 mm Hg). Echo this admission concerning for decreased valve function. Mean gradient now , peak gradient 97.28mmHg with VTI 1.07cm2. LVEF 55-60%, moderate concentric LVH. Concern for aortic valve vegetation.  Remain concerned regarding significantly increased AV gradients and possible endocarditis noted on TTE. TEE with was discussed with structural heart team but do not believe this will be beneficial since not candidate for valve replacement with pancreatic cancer. Continue cautious diuresis to protect preload ABX per primary team, consider ID consult. She has been started on Xarelto  for treatment of her atrial fibrillation, which would be treatment of her elevated gradients if due to leaflet thrombosis  Paroxysmal atrial fibrillation Patient with hx pAF since 2018. Initially on Xarelto  but this was discontinued in 2019 due to acute anemia (HGB 5.2) 2/2 GI bleeding and not resumed with ongoing assessment that risks>benefit. Patient found to be in afib with RVR in the ED on 5/4, was started back on Xarelto  20mg  daily and given Rx for toprol  xl. Initially sinus rhythm this  admission but did develop afib with RVR on the afternoon of 5/8. Patient given Diltiazem  bolus 10mg  and placed on infusion. She had spontaneous conversion to NSR around 6:30pm.  Patient back in sinus rhythm. Rate control strategy limited by soft BP. Continue Lopressor , will reduce dose to 25 mg BID.  Would need to consider Amiodarone with recurrent RVR and soft BP.  Continue Xarelto  20mg    Elevated troponin Type II MI HS troponin 32->82->98 in the setting of acute hypoxic respiratory failure. Patient without chest pain. Lab consistent with demand ischemia, continue to treat  respiratory failure/infection.   Hypertension Patient with normal/soft BP overnight and this morning.  Hold home antihypertensive agents.  For questions or updates, please contact [facility] Please consult [email or web address] for contact info under        Signed, Wendie Hamburg, MD

## 2024-02-15 NOTE — Progress Notes (Signed)
 Progress Note    MERIBETH BIENER   ZOX:[id number]  DOB: 07-24-1941  DOA: 02/12/2024     2 PCP: Benedetto Brady, MD  Initial CC: SOB  Hospital Course: Ms. Morman is an 83 year old female with pancreatic adenocarcinoma on gemcitabine  with questionable mets currently being followed by oncology.  Also PMH of TAVR, PAF, CVA, diabetes, HTN, GI bleed who presented with dyspnea and two-pillow orthopnea.  She also endorsed some swelling in her legs after standing but states that they improve after laying in the bed flat. Over the weekend had developed some cough but denied any sputum production.  Her symptoms of shortness of breath and leg swelling had been going on for approximately 1 month. She did endorse some chest pain over the weekend as well.  She just received day 8 cycle 4 of gemcitabine  on 02/12/2024 (day 1 was on 4/30).    Notable labs included: Procalcitonin 3.13 WBC 12 on admission Troponin steadily uptrending (32 >>82>>98) BNP 815   CXR showed concern for diffuse airspace opacities possibly pulmonary edema.   Given her cough, leukocytosis, and procalcitonin, she was also started on antibiotics for pneumonia coverage.   Echo was also obtained which showed concern for moderate to severe MR.  Thickened TAVR valve with prosthetic valve stenosis; severe thickening of aortic valve.  Echo report states findings concerning for potential aortic valve vegetation and recommending to consider TEE.   Only 1 set of blood cultures was obtained on admission.  Requesting to see if additional set can be obtained on 02/13/2024 but has already had antibiotics.   Cardiology was consulted on admission as well due to elevated troponin, BNP.  Echo findings resulted after consultation request.  Interval History:  Breathing remains improved.  She is even taking off her oxygen and not having any desaturations. Family present bedside this morning and updated.  Assessment and Plan: * Acute hypoxic respiratory  failure (HCC) - Not on oxygen at baseline - Possibly mixed etiology from underlying pulmonary edema and/or infiltrates - Continue oxygen as needed and wean as able  Sepsis (HCC) - Tachycardia, tachypnea, leukocytosis.  Suspected pulmonary source on admission - Possibility of endocarditis given echo findings of aortic valve, see below - Continue Rocephin  and azithromycin . Tolerating well -Follow-up blood cultures.  Only 1 set drawn on admission prior to antibiotics - All cultures remaining negative so far  Pulmonary edema - Concern for etiology from moderate to severe mitral valve regurg and diseased AV - Appreciate cardiology evaluation - Continue diuresis as pressure can tolerate  Elevated troponin-resolved as of 02/14/2024 - Suspecting demand ischemia in setting of RVR but also concerned for underlying ischemic etiology with uptrending troponin -Cardiology consulted to help further evaluate; agreed with demand ischemia -Troponin peaked at 98 - no further workup at this time per cardiology  S/P TAVR (transcatheter aortic valve replacement) - echo noting "TAVR valve is thickened and gradients are severely elevated consistent  with prosthetic valve stenosis" and "The aortic valve has been repaired/replaced. There is  severe calcifcation of the aortic  valve. There is severe thickening of the aortic valve. Aortic valve regurgitation is moderate to severe" - potential concern for vegetation of AV; cardiology has evaluated and due to not a valve replacement candidate in setting of pancreatic cancer, TEE was not recommended - ID consulted; awaiting further rec's at this time  PAF (paroxysmal atrial fibrillation) (HCC) - On Xarelto  at home and lopressor  as well - Developed RVR with soft BP; appreciate cardiology evaluation; was started  on Cardizem  drip on 02/13/2024 -Has converted back to NSR.  Now continued on Lopressor  and Xarelto .  Potential to consider amiodarone if recurrent RVR and soft  blood pressures going forward  Malignant neoplasm of head of pancreas [facility]) - follows with oncology, Dr. [name] - diagnosed 10/16/23 with EUS confirming adenocarcinoma  - Did not tolerate gemcitabine /Abraxane ; now on only gemcitabine .  Completed day 8 cycle 4 on 02/12/2024 - oncology aware of admission   History of GI bleed - Continue Protonix   History of stroke - on xarelto  and zocor   HTN (hypertension) - adjusting regimen for now  DMII (diabetes mellitus, type 2) (HCC) - A1c 7.1% on 01/13/24 - Continue SSI and CBG monitoring   Old records reviewed in assessment of this patient  Antimicrobials: Levaquin  02/13/2024 x 1 Rocephin  02/13/2024 >> current Azithromycin  02/14/2024 >> current Vancomycin  02/14/2024 >> current  DVT prophylaxis:   rivaroxaban  (XARELTO ) tablet 20 mg   Code Status:   Code Status: Limited: Do not attempt resuscitation (DNR) -DNR-LIMITED -Do Not Intubate/DNI   Mobility Assessment (Last 72 Hours)     Mobility Assessment     Row Name 02/15/24 0940 02/14/24 2019 02/14/24 0745 02/13/24 2200 02/13/24 1546   Does patient have an order for bedrest or is patient medically unstable -- No - Continue assessment No - Continue assessment No - Continue assessment No - Continue assessment   What is the highest level of mobility based on the progressive mobility assessment? Level 5 (Walks with assist in room/hall) - Balance while stepping forward/back and can walk in room with assist - Complete Level 1 (Bedfast) - Unable to balance while sitting on edge of bed Level 1 (Bedfast) - Unable to balance while sitting on edge of bed Level 2 (Chairfast) - Balance while sitting on edge of bed and cannot stand Level 2 (Chairfast) - Balance while sitting on edge of bed and cannot stand   Is the above level different from baseline mobility prior to current illness? -- Yes - Recommend PT order Yes - Recommend PT order Yes - Recommend PT order Yes - Recommend PT order    Row Name 02/13/24 1245            Does patient have an order for bedrest or is patient medically unstable No - Continue assessment       What is the highest level of mobility based on the progressive mobility assessment? Level 2 (Chairfast) - Balance while sitting on edge of bed and cannot stand       Is the above level different from baseline mobility prior to current illness? Yes - Recommend PT order                Barriers to discharge: none Disposition Plan:  Home HH orders placed: n/a Status is: Inpt  Objective: Blood pressure (!) 115/44, pulse 65, temperature 98 F (36.7 C), temperature source Oral, resp. rate 18, height 5\' 3"  (1.6 m), weight 67.2 kg, SpO2 97%.  Examination:  Physical Exam Constitutional:      Appearance: Normal appearance.  HENT:     Head: Normocephalic and atraumatic.     Mouth/Throat:     Mouth: Mucous membranes are moist.  Eyes:     Extraocular Movements: Extraocular movements intact.  Cardiovascular:     Rate and Rhythm: Normal rate.     Heart sounds: Murmur heard.  Pulmonary:     Effort: Pulmonary effort is normal. No respiratory distress.     Breath sounds: Rhonchi and  rales present. No wheezing.  Abdominal:     General: Bowel sounds are normal. There is no distension.     Palpations: Abdomen is soft.     Tenderness: There is no abdominal tenderness.  Musculoskeletal:        General: Normal range of motion.     Cervical back: Normal range of motion and neck supple.     Right lower leg: Edema (trace) present.     Left lower leg: Edema (trace) present.  Skin:    General: Skin is warm and dry.  Neurological:     General: No focal deficit present.     Mental Status: She is alert.  Psychiatric:        Mood and Affect: Mood normal.      Consultants:  Cardiology ID  Procedures:    Data Reviewed: Results for orders placed or performed during the hospital encounter of 02/12/24 (from the past 24 hours)  Glucose, capillary     Status: Abnormal   Collection  Time: 02/14/24  4:05 PM  Result Value Ref Range   Glucose-Capillary 138 (H) 70 - 99 mg/dL  Basic metabolic panel with GFR     Status: Abnormal   Collection Time: 02/15/24  2:20 AM  Result Value Ref Range   Sodium 135 135 - 145 mmol/L   Potassium 3.8 3.5 - 5.1 mmol/L   Chloride 97 (L) 98 - 111 mmol/L   CO2 30 22 - 32 mmol/L   Glucose, Bld 124 (H) 70 - 99 mg/dL   BUN 10 8 - 23 mg/dL   Creatinine, Ser 1.61 0.44 - 1.00 mg/dL   Calcium  9.4 8.9 - 10.3 mg/dL   GFR, Estimated >09 >60 mL/min   Anion gap 8 5 - 15  CBC with Differential/Platelet     Status: Abnormal   Collection Time: 02/15/24  2:20 AM  Result Value Ref Range   WBC 9.7 4.0 - 10.5 K/uL   RBC 3.63 (L) 3.87 - 5.11 MIL/uL   Hemoglobin 9.0 (L) 12.0 - 15.0 g/dL   HCT 45.4 (L) 09.8 - 11.9 %   MCV 78.5 (L) 80.0 - 100.0 fL   MCH 24.8 (L) 26.0 - 34.0 pg   MCHC 31.6 30.0 - 36.0 g/dL   RDW 14.7 (H) 82.9 - 56.2 %   Platelets 182 150 - 400 K/uL   nRBC 0.0 0.0 - 0.2 %   Neutrophils Relative % 81 %   Neutro Abs 8.0 (H) 1.7 - 7.7 K/uL   Lymphocytes Relative 16 %   Lymphs Abs 1.5 0.7 - 4.0 K/uL   Monocytes Relative 1 %   Monocytes Absolute 0.1 0.1 - 1.0 K/uL   Eosinophils Relative 1 %   Eosinophils Absolute 0.1 0.0 - 0.5 K/uL   Basophils Relative 0 %   Basophils Absolute 0.0 0.0 - 0.1 K/uL   Immature Granulocytes 1 %   Abs Immature Granulocytes 0.05 0.00 - 0.07 K/uL  Magnesium      Status: None   Collection Time: 02/15/24  2:20 AM  Result Value Ref Range   Magnesium  1.9 1.7 - 2.4 mg/dL  Culture, blood (Routine X 2) w Reflex to ID Panel     Status: None (Preliminary result)   Collection Time: 02/15/24  2:20 AM   Specimen: BLOOD LEFT HAND  Result Value Ref Range   Specimen Description BLOOD LEFT HAND    Special Requests      BOTTLES DRAWN AEROBIC AND ANAEROBIC Blood Culture adequate volume   Culture  NO GROWTH < 12 HOURS Performed at [facility], [address] [place], [place] [address]    Report Status PENDING    Culture, blood (Routine X 2) w Reflex to ID Panel     Status: None (Preliminary result)   Collection Time: 02/15/24  2:29 AM   Specimen: BLOOD  Result Value Ref Range   Specimen Description BLOOD LEFT ANTECUBITAL    Special Requests      BOTTLES DRAWN AEROBIC AND ANAEROBIC Blood Culture adequate volume   Culture      NO GROWTH < 12 HOURS Performed at [facility], [address], [place], [place] [address]    Report Status PENDING   Glucose, capillary     Status: Abnormal   Collection Time: 02/15/24  6:09 AM  Result Value Ref Range   Glucose-Capillary 114 (H) 70 - 99 mg/dL   Comment 1 Notify RN    Comment 2 Document in Chart   Glucose, capillary     Status: Abnormal   Collection Time: 02/15/24 11:31 AM  Result Value Ref Range   Glucose-Capillary 274 (H) 70 - 99 mg/dL    I have reviewed pertinent nursing notes, vitals, labs, and images as necessary. I have ordered labwork to follow up on as indicated.  I have reviewed the last notes from staff over past 24 hours. I have discussed patient's care plan and test results with nursing staff, CM/SW, and other staff as appropriate.  Time spent: Greater than 50% of the 55 minute visit was spent in counseling/coordination of care for the patient as laid out in the A&P.   LOS: 2 days   Faith Homes, MD [facility] 02/15/2024, 2:09 PM

## 2024-02-16 DIAGNOSIS — I38 Endocarditis, valve unspecified: Secondary | ICD-10-CM

## 2024-02-16 DIAGNOSIS — I480 Paroxysmal atrial fibrillation: Secondary | ICD-10-CM | POA: Diagnosis not present

## 2024-02-16 DIAGNOSIS — R7989 Other specified abnormal findings of blood chemistry: Secondary | ICD-10-CM | POA: Diagnosis not present

## 2024-02-16 DIAGNOSIS — J810 Acute pulmonary edema: Secondary | ICD-10-CM | POA: Diagnosis not present

## 2024-02-16 DIAGNOSIS — Z952 Presence of prosthetic heart valve: Secondary | ICD-10-CM | POA: Diagnosis not present

## 2024-02-16 DIAGNOSIS — A419 Sepsis, unspecified organism: Secondary | ICD-10-CM | POA: Diagnosis not present

## 2024-02-16 DIAGNOSIS — I5033 Acute on chronic diastolic (congestive) heart failure: Secondary | ICD-10-CM | POA: Diagnosis not present

## 2024-02-16 DIAGNOSIS — J9601 Acute respiratory failure with hypoxia: Secondary | ICD-10-CM | POA: Diagnosis not present

## 2024-02-16 LAB — CBC WITH DIFFERENTIAL/PLATELET
Abs Immature Granulocytes: 0.04 10*3/uL (ref 0.00–0.07)
Basophils Absolute: 0 10*3/uL (ref 0.0–0.1)
Basophils Relative: 1 %
Eosinophils Absolute: 0.2 10*3/uL (ref 0.0–0.5)
Eosinophils Relative: 3 %
HCT: 26.8 % — ABNORMAL LOW (ref 36.0–46.0)
Hemoglobin: 8.3 g/dL — ABNORMAL LOW (ref 12.0–15.0)
Immature Granulocytes: 1 %
Lymphocytes Relative: 21 %
Lymphs Abs: 1.3 10*3/uL (ref 0.7–4.0)
MCH: 24.5 pg — ABNORMAL LOW (ref 26.0–34.0)
MCHC: 31 g/dL (ref 30.0–36.0)
MCV: 79.1 fL — ABNORMAL LOW (ref 80.0–100.0)
Monocytes Absolute: 0.1 10*3/uL (ref 0.1–1.0)
Monocytes Relative: 2 %
Neutro Abs: 4.7 10*3/uL (ref 1.7–7.7)
Neutrophils Relative %: 72 %
Platelets: 185 10*3/uL (ref 150–400)
RBC: 3.39 MIL/uL — ABNORMAL LOW (ref 3.87–5.11)
RDW: 18.4 % — ABNORMAL HIGH (ref 11.5–15.5)
WBC: 6.3 10*3/uL (ref 4.0–10.5)
nRBC: 0 % (ref 0.0–0.2)

## 2024-02-16 LAB — BASIC METABOLIC PANEL WITH GFR
Anion gap: 10 (ref 5–15)
BUN: 11 mg/dL (ref 8–23)
CO2: 29 mmol/L (ref 22–32)
Calcium: 9 mg/dL (ref 8.9–10.3)
Chloride: 93 mmol/L — ABNORMAL LOW (ref 98–111)
Creatinine, Ser: 0.66 mg/dL (ref 0.44–1.00)
GFR, Estimated: >60 mL/min (ref >60)
Glucose, Bld: 316 mg/dL — ABNORMAL HIGH (ref 70–99)
Potassium: 3.4 mmol/L — ABNORMAL LOW (ref 3.5–5.1)
Sodium: 132 mmol/L — ABNORMAL LOW (ref 135–145)

## 2024-02-16 LAB — GLUCOSE, CAPILLARY
Glucose-Capillary: 122 mg/dL — ABNORMAL HIGH (ref 70–99)
Glucose-Capillary: 273 mg/dL — ABNORMAL HIGH (ref 70–99)
Glucose-Capillary: 135 mg/dL — ABNORMAL HIGH (ref 70–99)
Glucose-Capillary: 156 mg/dL — ABNORMAL HIGH (ref 70–99)

## 2024-02-16 LAB — MAGNESIUM: Magnesium: 1.6 mg/dL — ABNORMAL LOW (ref 1.7–2.4)

## 2024-02-16 MED ORDER — FUROSEMIDE 20 MG PO TABS
20.0000 mg | ORAL_TABLET | Freq: Every day | ORAL | Status: DC
  Administered 2024-02-16 – 2024-02-19 (×4): 20 mg via ORAL
  Filled 2024-02-16 (×4): qty 1

## 2024-02-16 NOTE — Progress Notes (Signed)
 Physical Therapy Treatment Patient Details Name: Janet Williamson MRN: [id number] DOB: 1941-01-27 Today's Date: 02/16/2024   History of Present Illness 83 y.o. female who was brought in from home 02/13/24 with acute worsening shortness of breath. BiPAP; acute HF; elevated troponin due to demand ischemia; PAF>NSR 5/9;  PMH pancreatic adenocarcinoma possibly metastatic with indeterminant liver lesion currently under investigation (currently on chemotherapy), biliary stent, paroxysmal A-fib on anticoagulation, TAVR, CVA, diabetes, hypertension, GI bleed,    PT Comments  Patient demonstrating fair balance when walking with cane (as she did PTA)--no physical assist needed, moves slowly, steadily. Discussed using her rollator first thing in the morning when her knees are stiff and using it to help carry objects that require 2 hands (laundry)--she reports she already uses rollator for these tasks. Will continue to benefit from PT for balance and gait training, including HHPT upon discharge.     If plan is discharge home, recommend the following: Assistance with cooking/housework;Assist for transportation   Can travel by private vehicle        Equipment Recommendations  None recommended by PT (has rollator)    Recommendations for Other Services       Precautions / Restrictions Precautions Precautions: Fall Recall of Precautions/Restrictions: Intact Precaution/Restrictions Comments: watch sats; Target HR <110 Restrictions Weight Bearing Restrictions Per Provider Order: No     Mobility  Bed Mobility Overal bed mobility: Needs Assistance Bed Mobility: Supine to Sit     Supine to sit: HOB elevated, Modified independent (Device/Increase time)          Transfers Overall transfer level: Needs assistance Equipment used: Straight cane Transfers: Sit to/from Stand Sit to Stand: Contact guard assist, Supervision           General transfer comment: no cues needed; progressed to  supervision over course of 3 transfers    Ambulation/Gait Ambulation/Gait assistance: Contact guard assist Gait Distance (Feet): 120 Feet Assistive device: Straight cane Gait Pattern/deviations: Step-through pattern, Decreased step length - right, Shuffle   Gait velocity interpretation: <1.8 ft/sec, indicate of risk for recurrent falls   General Gait Details: reports stiffness from arthritis making it difficult to advance RLE   Stairs             Wheelchair Mobility     Tilt Bed    Modified Rankin (Stroke Patients Only)       Balance Overall balance assessment: Needs assistance Sitting-balance support: No upper extremity supported, Feet unsupported Sitting balance-Leahy Scale: Good     Standing balance support: During functional activity, No upper extremity supported Standing balance-Leahy Scale: Fair Standing balance comment: able to hold static standing without UE support as pulling up panties                            Communication Communication Communication: No apparent difficulties  Cognition Arousal: Alert Behavior During Therapy: WFL for tasks assessed/performed   PT - Cognitive impairments: No apparent impairments                         Following commands: Intact      Cueing Cueing Techniques: Verbal cues  Exercises      General Comments General comments (skin integrity, edema, etc.): HR 84-109, sats 95% on RA      Pertinent Vitals/Pain Pain Assessment Pain Assessment: Faces Faces Pain Scale: Hurts little more Pain Location: B knees, worse on L and worse in standing Pain Descriptors /  Indicators: Aching, Discomfort, Sore, Guarding Pain Intervention(s): Limited activity within patient's tolerance, Monitored during session    Home Living                   Home Equipment: Rollator (4 wheels);BSC/3in1;Cane - single point      Prior Function            PT Goals (current goals can now be found in the  care plan section) Acute Rehab PT Goals Patient Stated Goal: walk with cane Time For Goal Achievement: 02/29/24 Potential to Achieve Goals: Good Progress towards PT goals: Progressing toward goals    Frequency    Min 2X/week      PT Plan      Co-evaluation              AM-PAC PT "6 Clicks" Mobility   Outcome Measure  Help needed turning from your back to your side while in a flat bed without using bedrails?: None Help needed moving from lying on your back to sitting on the side of a flat bed without using bedrails?: None Help needed moving to and from a bed to a chair (including a wheelchair)?: A Little Help needed standing up from a chair using your arms (e.g., wheelchair or bedside chair)?: A Little Help needed to walk in hospital room?: A Little Help needed climbing 3-5 steps with a railing? : A Little 6 Click Score: 20    End of Session Equipment Utilized During Treatment: Gait belt Activity Tolerance: Patient tolerated treatment well Patient left: in chair;with call bell/phone within reach;with chair alarm set Nurse Communication: Mobility status PT Visit Diagnosis: Other abnormalities of gait and mobility (R26.89);Muscle weakness (generalized) (M62.81)     Time: 1610-9604 PT Time Calculation (min) (ACUTE ONLY): 23 min  Charges:    $Gait Training: 23-37 mins PT General Charges $$ ACUTE PT VISIT: 1 Visit                      Gayle Kava, PT Acute Rehabilitation Services  Office [phone]    Guilford Leep 02/16/2024, 8:30 AM

## 2024-02-16 NOTE — Progress Notes (Signed)
 Patient Name: Janet Williamson Date of Encounter: 02/16/2024 [facility] Cardiologist: Dorothye Gathers, MD   Interval Summary  .    BP 131/53, SpO2 97% on room air.  I/Os not recorded yesterday.  No labs this morning. Denies chest pain or dyspnea this morning  Vital Signs .    Vitals:   02/16/24 0007 02/16/24 0237 02/16/24 0600 02/16/24 0726  BP: (!) 107/49 (!) 107/51  (!) 131/53  Pulse: 74 71  70  Resp: 16 19  16   Temp: 99 F (37.2 C) 98.6 F (37 C)  98.7 F (37.1 C)  TempSrc: Oral   Oral  SpO2: 96% 95%  97%  Weight:   66.5 kg   Height:        Intake/Output Summary (Last 24 hours) at 02/16/2024 0852 Last data filed at 02/15/2024 1700 Gross per 24 hour  Intake 780 ml  Output --  Net 780 ml      02/16/2024    6:00 AM 02/15/2024    6:00 AM 02/14/2024    4:44 AM  Last 3 Weights  Weight (lbs) 146 lb 9.7 oz 148 lb 2.4 oz 149 lb 11.1 oz  Weight (kg) 66.5 kg 67.2 kg 67.9 kg      Telemetry/ECG    Sinus rhythm since conversion to NSR around 1730 yesterday - Personally Reviewed  Physical Exam .   GEN: No acute distress.   Neck: No JVD Cardiac: RRR, 3/6 systolic murmur over RUSB Respiratory: Diminished breath sounds GI: Soft, nontender, non-distended  MS: No edema  Assessment & Plan .    Janet Williamson is a 83 y.o. female with a hx of pancreatic adenocarcinoma (metastatic?) on chemotherapy, s/p biliary stent, paroxysmal atrial fibrillation, aortic valve dysfunction s/p TAVR 2019, prior stroke, DM, hypertension, GI bleeding who is being seen for the evaluation of elevated troponin in the setting of acute hypoxic respiratory failure at the request of Dr. Gladstone Lamer.     Acute hypoxic respiratory failure Patient presented to the ED with EMS on CPAP due to acute onset dyspnea while at home. Initially treated with BiPAP, is now on [place] at 4LPM. BNP 815, WBC 12. CXR with diffuse airspace opacities bilaterally, felt consistent with pulmonary edema. Procalcitonin 3.13. BC  no growth to date. - She has been weaned to room air, appears euvolemic on exam, will transition to p.o. Lasix  today   Acute HFpEF S/P TAVR 23mm Sapien Endocarditis? Last echo from 2020 with normally functioning TAVR with elevated mean gradient of 30 mm Hg (previously 24 mm Hg). Echo this admission concerning for decreased valve function. Mean gradient now , peak gradient 97.58mmHg with VTI 1.07cm2. LVEF 55-60%, moderate concentric LVH. Concern for aortic valve vegetation.  Remain concerned regarding significantly increased AV gradients and possible endocarditis noted on TTE. TEE was discussed with structural heart team but do not believe this will be beneficial since not candidate for valve replacement with pancreatic cancer. ABX per primary team, consider ID consult. She has been started on Xarelto  for treatment of her atrial fibrillation, which would be treatment of her elevated gradients if due to leaflet thrombosis  Paroxysmal atrial fibrillation Patient with hx pAF since 2018. Initially on Xarelto  but this was discontinued in 2019 due to acute anemia (HGB 5.2) 2/2 GI bleeding and not resumed with ongoing assessment that risks>benefit. Patient found to be in afib with RVR in the ED on 5/4, was started back on Xarelto  20mg  daily and given Rx for toprol  xl. Initially  sinus rhythm this admission but did develop afib with RVR on the afternoon of 5/8. Patient given Diltiazem  bolus 10mg  and placed on infusion. She had spontaneous conversion to NSR  Remains in sinus rhythm. Rate control strategy limited by soft BP. Continue Lopressor , reduced dose to 25 mg BID.  Would need to consider Amiodarone with recurrent RVR and soft BP.  Continue Xarelto  20mg    Elevated troponin Type II MI HS troponin 32->82->98 in the setting of acute hypoxic respiratory failure. Patient without chest pain. Lab consistent with demand ischemia, continue to treat respiratory failure/infection.   Hypertension Patient  with normal/soft BP overnight and this morning.  Hold home antihypertensive agents.  For questions or updates, please contact [facility] Please consult [email or web address] for contact info under        Signed, Wendie Hamburg, MD

## 2024-02-16 NOTE — Consult Note (Addendum)
 [facility]                                                                                        Patient Identification: Patient Name: Janet Williamson MRN: [id number] Admit Date: 02/12/2024  9:26 PM Today's Date: 02/16/2024 Reason for consult: EV vegetation Requesting provider: Dr Gladstone Williamson   Principal Problem:   Acute hypoxic respiratory failure (HCC) Active Problems:   DMII (diabetes mellitus, type 2) (HCC)   HTN (hypertension)   History of stroke   PAF (paroxysmal atrial fibrillation) (HCC)   S/P TAVR (transcatheter aortic valve replacement)   History of GI bleed   Malignant neoplasm of head of pancreas (HCC)   Pulmonary edema   Sepsis (HCC)   Acute on chronic heart failure with preserved ejection fraction (HFpEF) (HCC)   Stenosis of prosthetic aortic valve  Antibiotics:  Vancomycin  5/9- Levofloxacin  5/7 Ceftriaxone  5/8- 5/10 Azithromycin  5/9-5/10  Lines/Hardware: Rt chest port   Assessment # Possible AV endocarditis  - 5/7 blood cx NGTD ( before abtx) - 5/8 blood cx NGTD ( after one dose of levofloxacin ) - 5/10 blood cx NGTD ( on abtx) - Does have a h/o malignancy to consider non bacterial thrombotic endocarditis.  No `known auto immune  conditions like SLE or APLA syndrome or chronic inflammatory conditions  - No recent dental work  Recommendations  - will continue Vancomycin , pharmacy to dose and ceftriaxone . Will cover staph, Viridans group Strep and enterococci with valve surgery more than a year ago.  - Given TTE findings, would favor TEE for clarification of vegetation and need for prolonged IV antibiotics, if can be safely done. It appears she may not be a candidate for  valve replacement per Cards.  - Monitor CBC, CMP and Vancomycin  trough  - Universal/standard isolation precautions Janet. Levern Williamson to start following from 5/12  Rest of the management as per the primary  team. Please call with questions or concerns.  Thank you for the consult  __________________________________________________________________________________________________________ HPI and Hospital Course: 83 year old female with prior history of pancreatic adenocarcinoma with possible metastasis with possible liver mets, on chemotherapy with gemcitabine ( last dose on 5/7) followed by Janet Williamson, s/p  biliary stent, paroxysmal A-fib on [facility], aortic stenosis s/p TAVR, CVA, DM, HTN, history of GI bleed who presented to the ED from home on 5/7 with acute worsening shortness of breath, orthopnea as well as swelling in her legs and arrived on CPAP.  She  also had nonproductive cough including few weeks of rhinorrhea but no fever, chills, or night sweats, chest pain, nausea vomiting or diarrhea. Denies prior h/o endocarditis, dental work. Lives alone, no pets and no recent travel. Denies GU symptoms.  Denies rashes or localized joint pain or swelling.   At ED, afebrile, acute respite distress, hypertensive Labs remarkable for WBC 12, troponin 32, BNP 815 5/7 5/8 and 5/10  blood cx NGTD Chest x-ray -diffuse airspace opacities bilaterally likely related to pulmonary edema Started on nitro drip as well as IV Lasix  and BiPAP + antibiotics for possible PNA TTE TAVR valve is thickened, severe thickening of the aortic valve, moderate to severe  AVR. Concerns for AV vegetation and recommending TEE.   ROS: General- Denies fever, chills, loss of appetite and loss of weight HEENT - Denies headache, blurry vision, neck pain, sinus pain Chest - Denies any chest pain CVS- Denies any dizziness/lightheadedness, syncopal attacks, palpitations Abdomen- Denies any nausea, vomiting, abdominal pain, hematochezia and diarrhea Neuro - Denies any weakness, numbness, tingling sensation Psych - Denies any changes in mood irritability or depressive symptoms GU- Denies any burning, dysuria, hematuria or increased frequency of  urination Skin - denies any rashes/lesions MSK - denies any joint pain/swelling or restricted ROM   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 Not seen on most recent TEE 2019.   Cardiomyopathy in other disease    Cataracts, bilateral    Diabetes mellitus    Type 2   Generalized osteoarthritis    GERD (gastroesophageal reflux disease)    HLD (hyperlipidemia)    Hypertension    Hypothyroidism    LBBB (left bundle branch block)    PAF (paroxysmal atrial fibrillation) (HCC)    a. s/p DCCV 06/2017, on Xarelto    Phlebitis    S/P TAVR (transcatheter aortic valve replacement) 04/15/2018   Edwards Sapien 3 THV (size 23 mm, model # 9600TFX, serial # [id number]) via the TF approach   Severe aortic stenosis    a. s/p TAVR 04/2018.   Stroke [facility]) 2008   Past Surgical History:  Procedure Laterality Date   ABDOMINAL HYSTERECTOMY     BILIARY BRUSHING  10/16/2023   Procedure: BILIARY BRUSHING;  Surgeon: Janet Hilts, MD;  Location: [name] ENDOSCOPY;  Service: Gastroenterology;;   BILIARY STENT PLACEMENT N/A 10/16/2023   Procedure: BILIARY STENT PLACEMENT;  Surgeon: Janet Hilts, MD;  Location: [facility] ENDOSCOPY;  Service: Gastroenterology;  Laterality: N/A;   BIOPSY  05/19/2019   Procedure: BIOPSY;  Surgeon: Janet Pitman, MD;  Location: [facility] ENDOSCOPY;  Service: Endoscopy;;   BREAST BIOPSY Left    years ago at "a doctor's office"   COLONOSCOPY     COLONOSCOPY WITH PROPOFOL  N/A 10/02/2018   Procedure: COLONOSCOPY WITH PROPOFOL ;  Surgeon: Janet Hilts, MD;  Location: [facility] ENDOSCOPY;  Service: Endoscopy;  Laterality: N/A;   ENTEROSCOPY N/A 05/19/2019   Procedure: ENTEROSCOPY;  Surgeon: Janet Pitman, MD;  Location: [facility] ENDOSCOPY;  Service: Endoscopy;  Laterality: N/A;   ERCP N/A 10/16/2023   Procedure: ENDOSCOPIC RETROGRADE CHOLANGIOPANCREATOGRAPHY (ERCP);  Surgeon: Janet Hilts, MD;  Location: [name]  ENDOSCOPY;  Service: Gastroenterology;  Laterality: N/A;   ESOPHAGOGASTRODUODENOSCOPY (EGD) WITH PROPOFOL  N/A 10/02/2018   Procedure: ESOPHAGOGASTRODUODENOSCOPY (EGD) WITH PROPOFOL ;  Surgeon: Janet Hilts, MD;  Location: [facility] ENDOSCOPY;  Service: Endoscopy;  Laterality: N/A;   ESOPHAGOGASTRODUODENOSCOPY (EGD) WITH PROPOFOL  N/A 04/23/2019   Procedure: ESOPHAGOGASTRODUODENOSCOPY (EGD) WITH PROPOFOL ;  Surgeon: Celedonio Coil, MD;  Location: [facility] ENDOSCOPY;  Service: Endoscopy;  Laterality: N/A;   ESOPHAGOGASTRODUODENOSCOPY (EGD) WITH PROPOFOL  N/A 10/16/2023   Procedure: ESOPHAGOGASTRODUODENOSCOPY (EGD) WITH PROPOFOL ;  Surgeon: Janet Hilts, MD;  Location: [facility] ENDOSCOPY;  Service: Gastroenterology;  Laterality: N/A;   EUS N/A 10/16/2023   Procedure: FULL UPPER ENDOSCOPIC ULTRASOUND (EUS) RADIAL;  Surgeon: Janet Hilts, MD;  Location: [facility] ENDOSCOPY;  Service: Gastroenterology;  Laterality: N/A;   EYE SURGERY Bilateral    cataract removal   FINE NEEDLE ASPIRATION N/A 10/16/2023   Procedure: FINE NEEDLE ASPIRATION (FNA) LINEAR;  Surgeon: Janet Hilts, MD;  Location: [facility] ENDOSCOPY;  Service:  Gastroenterology;  Laterality: N/A;   GIVENS CAPSULE STUDY N/A 10/02/2018   Procedure: GIVENS CAPSULE STUDY;  Surgeon: Janet Hilts, MD;  Location: [facility] ENDOSCOPY;  Service: Endoscopy;  Laterality: N/A;   IR IMAGING GUIDED PORT INSERTION  01/10/2024   RIGHT/LEFT HEART CATH AND CORONARY ANGIOGRAPHY N/A 03/06/2018   Procedure: RIGHT/LEFT HEART CATH AND CORONARY ANGIOGRAPHY;  Surgeon: Arty Binning, MD;  Location: [facility] INVASIVE CV LAB;  Service: Cardiovascular;  Laterality: N/A;   SMALL BOWEL ENTEROSCOPY  05/19/2019   SPHINCTEROTOMY  10/16/2023   Procedure: SPHINCTEROTOMY;  Surgeon: Janet Hilts, MD;  Location: [facility] ENDOSCOPY;  Service: Gastroenterology;;   TEE WITHOUT CARDIOVERSION N/A 04/01/2018   Procedure: TRANSESOPHAGEAL ECHOCARDIOGRAM (TEE);  Surgeon: Hugh Madura, MD;  Location: [facility] ENDOSCOPY;  Service:  Cardiovascular;  Laterality: N/A;   TEE WITHOUT CARDIOVERSION N/A 04/15/2018   Procedure: TRANSESOPHAGEAL ECHOCARDIOGRAM (TEE);  Surgeon: Arnoldo Lapping, MD;  Location: [facility] OR;  Service: Open Heart Surgery;  Laterality: N/A;   TOTAL KNEE ARTHROPLASTY Right 04/14/2013   Janet Rozelle Corning   TOTAL KNEE ARTHROPLASTY Right 04/14/2013   Procedure: TOTAL KNEE ARTHROPLASTY;  Surgeon: Jasmine Mesi, MD;  Location: [facility] OR;  Service: Orthopedics;  Laterality: Right;   TRANSCATHETER AORTIC VALVE REPLACEMENT, TRANSFEMORAL  04/15/2018   TRANSCATHETER AORTIC VALVE REPLACEMENT, TRANSFEMORAL Bilateral 04/15/2018   Procedure: TRANSCATHETER AORTIC VALVE REPLACEMENT, TRANSFEMORAL;  Surgeon: Arnoldo Lapping, MD;  Location: [facility] OR;  Service: Open Heart Surgery;  Laterality: Bilateral;     Scheduled Meds:  Chlorhexidine  Gluconate Cloth  6 each Topical Daily   furosemide   20 mg Oral Daily   insulin  aspart  0-5 Units Subcutaneous QHS   insulin  aspart  0-6 Units Subcutaneous TID WC   lipase/protease/amylase  72,000 Units Oral TID WC   metoprolol  tartrate  25 mg Oral BID   pantoprazole   20 mg Oral Daily   rivaroxaban   20 mg Oral q morning   simvastatin   20 mg Oral QHS   sodium chloride  flush  3 mL Intravenous Q12H   Continuous Infusions:  cefTRIAXone  (ROCEPHIN )  IV Stopped (02/15/24 1512)   vancomycin  Stopped (02/15/24 1005)   PRN Meds:.acetaminophen , albuterol , lipase/protease/amylase **AND** lipase/protease/amylase, oxyCODONE , sodium chloride  flush  Allergies  Allergen Reactions   Ace Inhibitors Swelling and Other (See Comments)    Angioedema    Keflex [Cephalexin] Swelling and Other (See Comments)    Lips became swollen   Rifadin [Rifampin] Swelling and Other (See Comments)    Lips became swollen   Social History   Socioeconomic History   Marital status: Widowed    Spouse name: Not on file   Number of children: Not on file   Years of education: Not on file   Highest education level: Not on file   Occupational History   Not on file  Tobacco Use   Smoking status: Never   Smokeless tobacco: Never  Vaping Use   Vaping status: Never Used  Substance and Sexual Activity   Alcohol use: No   Drug use: No   Sexual activity: Not Currently  Other Topics Concern   Not on file  Social History Narrative   Not on file   Social Drivers of Health   Financial Resource Strain: Not on file  Food Insecurity: No Food Insecurity (02/14/2024)   Hunger Vital Sign    Worried About Running Out of Food in the Last Year: Never true    Ran Out of Food in the Last Year: Never true  Transportation Needs: No Transportation Needs (02/14/2024)  PRAPARE - Administrator, Civil Service (Medical): No    Lack of Transportation (Non-Medical): No  Physical Activity: Not on file  Stress: Not on file  Social Connections: Unknown (02/14/2024)   Social Connection and Isolation Panel [NHANES]    Frequency of Communication with Friends and Family: More than three times a week    Frequency of Social Gatherings with Friends and Family: Three times a week    Attends Religious Services: 1 to 4 times per year    Active Member of Clubs or Organizations: Yes    Attends Banker Meetings: 1 to 4 times per year    Marital Status: Patient declined  Intimate Partner Violence: Not At Risk (02/14/2024)   Humiliation, Afraid, Rape, and Kick questionnaire    Fear of Current or Ex-Partner: No    Emotionally Abused: No    Physically Abused: No    Sexually Abused: No   Family History  Problem Relation Age of Onset   Stroke Mother    Hypertension Mother    Hypertension Father    Heart attack Neg Hx    Vitals BP (!) 147/57 (BP Location: Left Arm)   Pulse 67   Temp 98.7 F (37.1 C) (Oral)   Resp 19   Ht 5\' 3"  (1.6 m)   Wt 66.5 kg   SpO2 99%   BMI 25.97 kg/m    Physical Exam Constitutional: Elderly female sitting in the recliner, nontoxic-appearing    Comments: ENT WNL  Cardiovascular:      Rate and Rhythm: Normal rate and regular rhythm.     Heart sounds: S1 and S2 murmur  Pulmonary:     Effort: Pulmonary effort is normal.     Comments: Normal breath sounds  Abdominal:     Palpations: Abdomen is soft.     Tenderness: Non distended and nontender  Musculoskeletal:        General: No swelling or tenderness in peripheral joints  Skin:    Comments: Right chest port with no signs of infection  Neurological:     General: Awake, alert and oriented, grossly nonfocal  Psychiatric:        Mood and Affect: Mood normal.    Pertinent Microbiology Results for orders placed or performed during the hospital encounter of 02/12/24  Culture, blood (routine x 2)     Status: None (Preliminary result)   Collection Time: 02/12/24 11:36 PM   Specimen: BLOOD RIGHT HAND  Result Value Ref Range Status   Specimen Description BLOOD RIGHT HAND  Final   Special Requests   Final    BOTTLES DRAWN AEROBIC ONLY Blood Culture results may not be optimal due to an inadequate volume of blood received in culture bottles   Culture   Final    NO GROWTH 3 DAYS Performed at [facility], [address], [place], [place] [address]    Report Status PENDING  Incomplete  MRSA Next Gen by PCR, Nasal     Status: None   Collection Time: 02/13/24 12:20 AM   Specimen: Nasal Mucosa; Nasal Swab  Result Value Ref Range Status   MRSA by PCR Next Gen NOT DETECTED NOT DETECTED Final    Comment: (NOTE) The GeneXpert MRSA Assay (FDA approved for NASAL specimens only), is one component of a comprehensive MRSA colonization surveillance program. It is not intended to diagnose MRSA infection nor to guide or monitor treatment for MRSA infections. Test performance is not FDA approved in patients less  than 70 years old. Performed at [facility], [address] [place], [place] [address]   Resp panel by RT-PCR (RSV, Flu A&B, Covid) Nasal Mucosa     Status: None   Collection Time: 02/13/24 12:30 AM    Specimen: Nasal Mucosa; Nasal Swab  Result Value Ref Range Status   SARS Coronavirus 2 by RT PCR NEGATIVE NEGATIVE Final   Influenza A by PCR NEGATIVE NEGATIVE Final   Influenza B by PCR NEGATIVE NEGATIVE Final    Comment: (NOTE) The Xpert Xpress SARS-CoV-2/FLU/RSV plus assay is intended as an aid in the diagnosis of influenza from Nasopharyngeal swab specimens and should not be used as a sole basis for treatment. Nasal washings and aspirates are unacceptable for Xpert Xpress SARS-CoV-2/FLU/RSV testing.  Fact Sheet for Patients: BloggerCourse.com  Fact Sheet for Healthcare Providers: SeriousBroker.it  This test is not yet approved or cleared by the [place]  FDA and has been authorized for detection and/or diagnosis of SARS-CoV-2 by FDA under an Emergency Use Authorization (EUA). This EUA will remain in effect (meaning this test can be used) for the duration of the COVID-19 declaration under Section 564(b)(1) of the Act, 21 U.S.C. section 360bbb-3(b)(1), unless the authorization is terminated or revoked.     Resp Syncytial Virus by PCR NEGATIVE NEGATIVE Final    Comment: (NOTE) Fact Sheet for Patients: BloggerCourse.com  Fact Sheet for Healthcare Providers: SeriousBroker.it  This test is not yet approved or cleared by the [place]  FDA and has been authorized for detection and/or diagnosis of SARS-CoV-2 by FDA under an Emergency Use Authorization (EUA). This EUA will remain in effect (meaning this test can be used) for the duration of the COVID-19 declaration under Section 564(b)(1) of the Act, 21 U.S.C. section 360bbb-3(b)(1), unless the authorization is terminated or revoked.  Performed at [facility], [address] [place], [place] [address]   Culture, blood (Routine X 2) w Reflex to ID Panel     Status: None (Preliminary result)   Collection Time:  02/13/24  4:33 PM   Specimen: BLOOD LEFT HAND  Result Value Ref Range Status   Specimen Description BLOOD LEFT HAND  Final   Special Requests   Final    BOTTLES DRAWN AEROBIC AND ANAEROBIC Blood Culture adequate volume   Culture   Final    NO GROWTH 3 DAYS Performed at [facility], [address], [place], [place] [address]    Report Status PENDING  Incomplete  Culture, blood (Routine X 2) w Reflex to ID Panel     Status: None (Preliminary result)   Collection Time: 02/13/24  4:34 PM   Specimen: BLOOD  Result Value Ref Range Status   Specimen Description BLOOD SITE NOT SPECIFIED  Final   Special Requests   Final    BOTTLES DRAWN AEROBIC AND ANAEROBIC Blood Culture results may not be optimal due to an inadequate volume of blood received in culture bottles   Culture   Final    NO GROWTH 3 DAYS Performed at [facility], [address] [place], [place] [address]    Report Status PENDING  Incomplete  Culture, blood (Routine X 2) w Reflex to ID Panel     Status: None (Preliminary result)   Collection Time: 02/15/24  2:20 AM   Specimen: BLOOD LEFT HAND  Result Value Ref Range Status   Specimen Description BLOOD LEFT HAND  Final   Special Requests   Final    BOTTLES  DRAWN AEROBIC AND ANAEROBIC Blood Culture adequate volume   Culture   Final    NO GROWTH 1 DAY Performed at [facility], [address] [place], [place] [address]    Report Status PENDING  Incomplete  Culture, blood (Routine X 2) w Reflex to ID Panel     Status: None (Preliminary result)   Collection Time: 02/15/24  2:29 AM   Specimen: BLOOD  Result Value Ref Range Status   Specimen Description BLOOD LEFT ANTECUBITAL  Final   Special Requests   Final    BOTTLES DRAWN AEROBIC AND ANAEROBIC Blood Culture adequate volume   Culture   Final    NO GROWTH 1 DAY Performed at [facility], [address] [place], [place] [address]    Report Status PENDING  Incomplete   Pertinent Lab seen by  me:    Latest Ref Rng & Units 02/16/2024    9:15 AM 02/15/2024    2:20 AM 02/14/2024    2:57 AM  CBC  WBC 4.0 - 10.5 K/uL 6.3  9.7  6.1   Hemoglobin 12.0 - 15.0 g/dL 8.3  9.0  8.4   Hematocrit 36.0 - 46.0 % 26.8  28.5  26.9   Platelets 150 - 400 K/uL 185  182  171       Latest Ref Rng & Units 02/16/2024    9:15 AM 02/15/2024    2:20 AM 02/14/2024    2:57 AM  CMP  Glucose 70 - 99 mg/dL 829  562  130   BUN 8 - 23 mg/dL 11  10  9    Creatinine 0.44 - 1.00 mg/dL 8.65  7.84  6.96   Sodium 135 - 145 mmol/L 132  135  135   Potassium 3.5 - 5.1 mmol/L 3.4  3.8  3.5   Chloride 98 - 111 mmol/L 93  97  99   CO2 22 - 32 mmol/L 29  30  29    Calcium  8.9 - 10.3 mg/dL 9.0  9.4  9.1     Pertinent Imagings/Other Imagings Plain films and CT images have been personally visualized and interpreted; radiology reports have been reviewed. Decision making incorporated into the Impression / Recommendations.  MR LIVER W WO CONTRAST Result Date: 02/15/2024 CLINICAL DATA:  Re-staging pancreatic cancer. EXAM: MRI ABDOMEN WITHOUT AND WITH CONTRAST TECHNIQUE: Multiplanar multisequence MR imaging of the abdomen was performed both before and after the administration of intravenous contrast. CONTRAST:  7mL GADAVIST  GADOBUTROL  1 MMOL/ML IV SOLN COMPARISON:  Follow-up prior CT scan 01/29/2024 and PET-CT 11/01/2023. FINDINGS: Lower chest: The lung bases are grossly clear. No pulmonary lesions, pleural or pericardial effusion. Hepatobiliary: Overall stable intrahepatic biliary dilatation and pneumobilia related to a biliary stent. 10 mm lesion in segment 4A of the liver not for certain seen on prior imaging studies. This has slight increased T2 signal intensity and demonstrates delayed rim like contrast enhancement. Findings suspicious for a metastatic focus. No other hepatic lesions are identified. Pancreas: Stable large infiltrating mass in the pancreatic head measuring approximately 4.3 x 3.4 cm. Associated marked dilatation of  the main pancreatic duct which measures a maximum 11 mm. Stable severe atrophy involving the body and tail region of the pancreas. Spleen:  Within normal limits in size and appearance. Adrenals/Urinary Tract: The adrenal glands and kidneys are unremarkable and stable. Stomach/Bowel: The stomach, duodenum, visualized small and visualized colon are unremarkable. Vascular/Lymphatic: The aorta and branch vessels are patent. The major venous structures are patent.  No mesenteric or retroperitoneal lymphadenopathy the. Other:  No ascites or abdominal wall hernia. Musculoskeletal: No findings suspicious for osseous metastatic disease. IMPRESSION: 1. Stable large infiltrating mass in the pancreatic head measuring approximately 4.3 x 3.4 cm. Associated marked dilatation of the main pancreatic duct and severe atrophy involving the body and tail region of the pancreas. 2. 10 mm lesion in segment 4A of the liver not for certain seen on prior imaging studies. This has slight increased T2 signal intensity and demonstrates delayed rim like contrast enhancement. Findings suspicious for a metastatic focus. 3. Stable intrahepatic biliary dilatation and pneumobilia related to a biliary stent. Electronically Signed   By: Marrian Siva M.D.   On: 02/15/2024 18:11   DG CHEST PORT 1 VIEW Result Date: 02/15/2024 CLINICAL DATA:  Follow-up pneumonia. EXAM: PORTABLE CHEST 1 VIEW COMPARISON:  02/12/2024 FINDINGS: The cardiac silhouette, mediastinal hilar contours are stable. Stable tortuosity and calcification of the thoracic aorta. Stable right IJ Port-A-Cath. Stable surgical changes from TAVR. The lungs demonstrate much improved aeration with resolving bilateral pulmonary infiltrates. No pleural effusions or pneumothorax. IMPRESSION: Much improved lung aeration with resolving bilateral pulmonary infiltrates. Electronically Signed   By: Marrian Siva M.D.   On: 02/15/2024 17:47   ECHOCARDIOGRAM COMPLETE Result Date: 02/13/2024     ECHOCARDIOGRAM REPORT   Patient Name:   BATOOL REASONOVER Date of Exam: 02/13/2024 Medical Rec #:  [id number]         Height:       63.0 in Accession #:    [id number]        Weight:       154.2 lb Date of Birth:  Feb 28, 1941          BSA:          1.732 m Patient Age:    83 years          BP:           105/45 mmHg Patient Gender: F                 HR:           72 bpm. Exam Location:  Inpatient Procedure: 2D Echo, 3D Echo, Cardiac Doppler and Color Doppler (Both Spectral            and Color Flow Doppler were utilized during procedure). Indications:    I50.40* Unspecified combined systolic (congestive) and diastolic                 (congestive) heart failure  History:        Patient has prior history of Echocardiogram examinations, most                 recent 03/20/2019. Abnormal ECG, Stroke, Aortic Valve Disease,                 Arrythmias:LBBB, Signs/Symptoms:Edema and Dyspnea; Risk                 Factors:Hypertension and Diabetes. Aortic stenosis. TAVR.                 Metastatic cancer. Chemo.                 Aortic Valve: 23 mm Edwards Sapien prosthetic, stented (TAVR)                 valve is present in the aortic position. Procedure Date:  04/15/2018.  Sonographer:    Raynelle Callow RDCS Referring Phys: [id number] [facility] IMPRESSIONS  1. Left ventricular ejection fraction, by estimation, is 55 to 60%. The left ventricle has normal function. The left ventricle has no regional wall motion abnormalities. There is moderate concentric left ventricular hypertrophy. Left ventricular diastolic parameters are consistent with Grade I diastolic dysfunction (impaired relaxation). Elevated left ventricular end-diastolic pressure.  2. Right ventricular systolic function is normal. The right ventricular size is normal. There is mildly elevated pulmonary artery systolic pressure.  3. Left atrial size was mildly dilated.  4. The mitral valve is degenerative. Moderate to severe mitral valve regurgitation. Mild mitral  stenosis. Moderate mitral annular calcification.  5. Tricuspid valve regurgitation is mild to moderate.  6. TAVR valve is thickened and gradients are severely elevated consistent with prosthetic valve stenosis. Mean gradient has increased to 55 mmHg from 30 mmHg 03/2019. The aortic valve has been repaired/replaced. There is severe calcifcation of the aortic  valve. There is severe thickening of the aortic valve. Aortic valve regurgitation is moderate to severe. There is a 23 mm Edwards Sapien prosthetic (TAVR) valve present in the aortic position. Procedure Date: 04/15/2018. Echo findings are consistent with stenosis and regurgitation of the aortic prosthesis. Aortic valve area, by VTI measures 1.07 cm. Aortic valve mean gradient measures 55.0 mmHg. Aortic valve Vmax measures 4.94 m/s.  7. Pulmonic valve regurgitation is moderate.  8. The inferior vena cava is dilated in size with >50% respiratory variability, suggesting right atrial pressure of 8 mmHg. Conclusion(s)/Recommendation(s): Findings concerning for aortic valve vegetation, would recommend a Transesophageal Echocardiogram for clarification. FINDINGS  Left Ventricle: Left ventricular ejection fraction, by estimation, is 55 to 60%. The left ventricle has normal function. The left ventricle has no regional wall motion abnormalities. The left ventricular internal cavity size was normal in size. There is  moderate concentric left ventricular hypertrophy. Left ventricular diastolic parameters are consistent with Grade I diastolic dysfunction (impaired relaxation). Elevated left ventricular end-diastolic pressure. Right Ventricle: The right ventricular size is normal. No increase in right ventricular wall thickness. Right ventricular systolic function is normal. There is mildly elevated pulmonary artery systolic pressure. The tricuspid regurgitant velocity is 2.94  m/s, and with an assumed right atrial pressure of 8 mmHg, the estimated right ventricular systolic  pressure is 42.6 mmHg. Left Atrium: Left atrial size was mildly dilated. Right Atrium: Right atrial size was normal in size. Pericardium: There is no evidence of pericardial effusion. Mitral Valve: The mitral valve is degenerative in appearance. There is moderate thickening of the mitral valve leaflet(s). There is mild calcification of the mitral valve leaflet(s). Moderate mitral annular calcification. Moderate to severe mitral valve regurgitation. Mild mitral valve stenosis. MV peak gradient, 10.4 mmHg. The mean mitral valve gradient is 4.0 mmHg. Tricuspid Valve: The tricuspid valve is normal in structure. Tricuspid valve regurgitation is mild to moderate. No evidence of tricuspid stenosis. Aortic Valve: TAVR valve is thickened and gradients are severely elevated consistent with prosthetic valve stenosis. Mean gradient has increased to 55 mmHg from 30 mmHg 03/2019. The aortic valve has been repaired/replaced. There is severe calcifcation of the aortic valve. There is severe thickening of the aortic valve. Aortic valve regurgitation is moderate to severe. Aortic regurgitation PHT measures 409 msec. Aortic valve mean gradient measures 55.0 mmHg. Aortic valve peak gradient measures 97.6 mmHg. Aortic valve area, by VTI measures 1.07 cm. There is a 23 mm Edwards Sapien prosthetic, stented (TAVR) valve present in the aortic position. Procedure  Date: 04/15/2018. Pulmonic Valve: The pulmonic valve was grossly normal. Pulmonic valve regurgitation is moderate. No evidence of pulmonic stenosis. Aorta: The aortic root and ascending aorta are structurally normal, with no evidence of dilitation. Venous: The inferior vena cava is dilated in size with greater than 50% respiratory variability, suggesting right atrial pressure of 8 mmHg. IAS/Shunts: No atrial level shunt detected by color flow Doppler. Additional Comments: 3D was performed not requiring image post processing on an independent workstation and was abnormal.  LEFT  VENTRICLE PLAX 2D LVIDd:         3.80 cm     Diastology LVIDs:         2.60 cm     LV e' medial:    4.24 cm/s LV PW:         1.30 cm     LV E/e' medial:  30.1 LV IVS:        1.40 cm     LV e' lateral:   5.11 cm/s LVOT diam:     2.30 cm     LV E/e' lateral: 25.0 LV SV:         116 LV SV Index:   67 LVOT Area:     4.15 cm  LV Volumes (MOD) LV vol d, MOD A2C: 87.2 ml LV vol d, MOD A4C: 86.3 ml LV vol s, MOD A2C: 44.6 ml LV vol s, MOD A4C: 38.9 ml LV SV MOD A2C:     42.6 ml LV SV MOD A4C:     86.3 ml LV SV MOD BP:      46.3 ml RIGHT VENTRICLE             IVC RV S prime:     10.90 cm/s  IVC diam: 2.20 cm TAPSE (M-mode): 2.6 cm LEFT ATRIUM             Index        RIGHT ATRIUM           Index LA diam:        4.30 cm 2.48 cm/m   RA Area:     12.60 cm LA Vol (A2C):   53.3 ml 30.78 ml/m  RA Volume:   27.80 ml  16.06 ml/m LA Vol (A4C):   56.0 ml 32.34 ml/m LA Biplane Vol: 54.7 ml 31.59 ml/m  AORTIC VALVE                     PULMONIC VALVE AV Area (Vmax):    1.03 cm      PR End Diast Vel: 1.67 msec AV Area (Vmean):   1.06 cm AV Area (VTI):     1.07 cm AV Vmax:           494.00 cm/s AV Vmean:          336.500 cm/s AV VTI:            1.080 m AV Peak Grad:      97.6 mmHg AV Mean Grad:      55.0 mmHg LVOT Vmax:         123.00 cm/s LVOT Vmean:        86.200 cm/s LVOT VTI:          0.278 m LVOT/AV VTI ratio: 0.26 AI PHT:            409 msec  AORTA Ao Root diam: 2.80 cm Ao Asc diam:  3.70 cm MITRAL VALVE  TRICUSPID VALVE MV Area (PHT): 3.10 cm       TR Peak grad:   34.6 mmHg MV Area VTI:   2.62 cm       TR Vmax:        294.00 cm/s MV Peak grad:  10.4 mmHg MV Mean grad:  4.0 mmHg       SHUNTS MV Vmax:       1.61 m/s       Systemic VTI:  0.28 m MV Vmean:      99.8 cm/s      Systemic Diam: 2.30 cm MV Decel Time: 245 msec MR Peak grad:    180.1 mmHg MR Mean grad:    77.0 mmHg MR Vmax:         671.00 cm/s MR Vmean:        372.0 cm/s MR PISA:         3.08 cm MR PISA Eff ROA: 18 mm MR PISA Radius:  0.70 cm MV E  velocity: 127.50 cm/s MV A velocity: 137.50 cm/s MV E/A ratio:  0.93 Maudine Sos MD Electronically signed by Maudine Sos MD Signature Date/Time: 02/13/2024/12:36:11 PM    Final    DG Chest Portable 1 View Result Date: 02/12/2024 CLINICAL DATA:  Shortness of breath EXAM: PORTABLE CHEST 1 VIEW COMPARISON:  02/09/2024 FINDINGS: Check shadow is stable. Changes of prior TAVR are noted. Right chest wall port is again seen. Aortic calcifications are noted. The lungs are well aerated bilaterally. Diffuse airspace opacity is noted right greater than left consistent with diffuse edema given the abrupt onset. No sizable effusion is seen. No bony abnormality is noted. IMPRESSION: Diffuse airspace opacities bilaterally likely related to pulmonary edema. Electronically Signed   By: Violeta Grey M.D.   On: 02/12/2024 22:17   DG Chest Port 1 View Result Date: 02/09/2024 CLINICAL DATA:  Shortness of breath EXAM: PORTABLE CHEST 1 VIEW COMPARISON:  05/17/2019 FINDINGS: Changes of prior TAVR are again seen. Aortic calcifications are noted. New right chest wall port is seen in satisfactory position. Lungs are well aerated bilaterally. No focal infiltrate or sizable effusion is seen. No bony abnormality is noted. IMPRESSION: No active disease. Electronically Signed   By: Violeta Grey M.D.   On: 02/09/2024 19:36   CT PANCREAS ABDOMEN W WO CONTRAST Result Date: 02/04/2024 CLINICAL DATA:  Pancreatic cancer, assess treatment response. * Tracking Code: BO * EXAM: CT ABDOMEN WITHOUT AND WITH CONTRAST TECHNIQUE: Multidetector CT imaging of the abdomen was performed following the standard protocol before and following the bolus administration of intravenous contrast. RADIATION DOSE REDUCTION: This exam was performed according to the departmental dose-optimization program which includes automated exposure control, adjustment of the mA and/or kV according to patient size and/or use of iterative reconstruction technique. CONTRAST:   OMNIPAQUE  IOHEXOL  350 MG/ML SOLN COMPARISON:  10/08/2023 FINDINGS: Lower chest: 10 mm nodule within the right lower lobe, axial image # 4/6, is stable since remote prior examination of 04/09/2018 safely considered benign. A 4 mm adjacent pulmonary nodule within the right lower lobe was present on immediate prior examination, but was not seen remote prior examination and is technically indeterminate. This is unchanged, however. Cardiac size within normal limits. Hypoattenuation of the cardiac blood pool in keeping with at least mild anemia. Hepatobiliary: Interval palliative stenting of the extrahepatic bile duct. Pneumobilia within the intra and extrahepatic biliary tree in keeping with patency of the stented segment. 11 mm indeterminate hypodense lesion is seen left hepatic lobe, axial  image # 14/9 new since prior examination and demonstrating some degree of marginal irregular enhancement, suspicious for intrahepatic metastasis. Portal vein is patent. Gallbladder is unremarkable. Pancreas: The lobulated hypoenhancing mass within the head of the pancreas extending into the uncinate process is grossly stable measuring 3.8 x 4.7 x 3.2 cm in greatest dimension. The mass encases the superior mesenteric artery at axial image # 46/9 encases and severely narrows the superior mesenteric vein at axial image # 42/9 obstructs the central pancreatic duct which is stably, markedly dilated throughout the more distal pancreas, and abuts the stented portion the extrahepatic. The mass extends into the small bowel mesentery inferiorly, similar to prior examination with occlusion of several mesenteric venous tributaries. The body and tail the pancreas demonstrates progressive parenchymal atrophy. No superimposed peripancreatic inflammatory changes are identified. The mass abuts but does not clearly a adjacent second and third portion of the duodenum. Spleen: Unremarkable Adrenals/Urinary Tract: Adrenal glands are unremarkable.  Kidneys are normal, without renal calculi, focal lesion, or hydronephrosis. Stomach/Bowel: There is mild asymmetric wall thickening the visualized ascending and proximal transverse colon which may relate to impaired venous drainage related to occluded mesenteric venous collateral centrally. Visualized large and small bowel are otherwise. No free intraperitoneal gas or fluid. Vascular/Lymphatic: As noted above, the superior mesenteric vein is markedly narrowed and the superior mesenteric artery are encased by the malignant mass. Additionally, numerous mesenteric veins are obliterated by the mass and there is collateralization to the gastroepiploic vein and inferior mesenteric vein which ultimately drain to the splenic vein. Other: No abdominal wall hernia Musculoskeletal: No acute bone abnormality. No lytic or blastic bone lesion. Osseous structures are age appropriate. IMPRESSION: 1. Grossly stable size and appearance of a 4.7 cm malignant mass within the head of the pancreas extending into the uncinate process. The mass encases the superior mesenteric artery and encases and severely narrows the superior mesenteric vein. The mass also extends into the small bowel mesentery inferiorly with occlusion of several mesenteric venous tributaries. 2. Interval palliative stenting of the extrahepatic bile duct with expected pneumobilia. 3. 11 mm indeterminate hypodense lesion within the left hepatic lobe, new since prior examination, suspicious for a intrahepatic metastasis. This could be confirmed with dedicated contrast enhanced MRI imaging or re-evaluated on subsequent examination. 4. 4 mm right lower lobe pulmonary nodule, stable since immediate prior examination, but not seen on remote prior examination. Close attention on follow-up imaging is warranted. 5. Hypoattenuation of the cardiac blood pool in keeping with at least mild anemia. 6. Mild asymmetric wall thickening of the ascending and proximal transverse colon  which may relate to impaired venous drainage related to occluded mesenteric venous tributaries. Electronically Signed   By: Worthy Heads M.D.   On: 02/04/2024 19:51   I have personally spent 95 minutes involved in face-to-face and non-face-to-face activities for this patient on the day of the visit. Professional time spent includes the following activities: Preparing to see the patient (review of tests), Obtaining and/or reviewing separately obtained history (admission/discharge record), Performing a medically appropriate examination and/or evaluation , Ordering medications/tests/procedures, referring and communicating with other health care professionals, Documenting clinical information in the EMR, Independently interpreting results (not separately reported), Communicating results to the patient/family/caregiver, Counseling and educating the patient/family/caregiver and Care coordination (not separately reported).  Electronically signed by:   Plan d/w requesting provider as well as ID pharm D  Of note, portions of this note may have been created with voice recognition software. While this note has been edited  for accuracy, occasional wrong-word or 'sound-a-like' substitutions may have occurred due to the inherent limitations of voice recognition software.   Terre Ferri, MD Infectious Disease Physician [facility] Pager: [phone]

## 2024-02-16 NOTE — Progress Notes (Signed)
 Progress Note    Janet Williamson   UUV:[id number]  DOB: 12/07/1940  DOA: 02/12/2024     3 PCP: Benedetto Brady, MD  Initial CC: SOB  Hospital Course: Ms. Holleman is an 83 year old female with pancreatic adenocarcinoma on gemcitabine  with questionable mets currently being followed by oncology.  Also PMH of TAVR, PAF, CVA, diabetes, HTN, GI bleed who presented with dyspnea and two-pillow orthopnea.  She also endorsed some swelling in her legs after standing but states that they improve after laying in the bed flat. Over the weekend had developed some cough but denied any sputum production.  Her symptoms of shortness of breath and leg swelling had been going on for approximately 1 month. She did endorse some chest pain over the weekend as well.  She just received day 8 cycle 4 of gemcitabine  on 02/12/2024 (day 1 was on 4/30).    Notable labs included: Procalcitonin 3.13 WBC 12 on admission Troponin steadily uptrending (32 >>82>>98) BNP 815   CXR showed concern for diffuse airspace opacities possibly pulmonary edema.   Given her cough, leukocytosis, and procalcitonin, she was also started on antibiotics for pneumonia coverage.   Echo was also obtained which showed concern for moderate to severe MR.  Thickened TAVR valve with prosthetic valve stenosis; severe thickening of aortic valve.  Echo report states findings concerning for potential aortic valve vegetation and recommending to consider TEE.   Only 1 set of blood cultures was obtained on admission.  Requesting to see if additional set can be obtained on 02/13/2024 but has already had antibiotics.   Cardiology was consulted on admission as well due to elevated troponin, BNP.  Echo findings resulted after consultation request.  Interval History:  Remains on room air and shortness of breath has resolved.  ID does recommend consideration of TEE.  Will discuss further with cardiology on Monday.  Assessment and Plan: * Acute hypoxic  respiratory failure (HCC)-resolved as of 02/16/2024 - Not on oxygen at baseline - Possibly mixed etiology from underlying pulmonary edema and/or infiltrates - Successfully weaned to room air  Elevated troponin-resolved as of 02/14/2024 - Suspecting demand ischemia in setting of RVR but also concerned for underlying ischemic etiology with uptrending troponin -Cardiology consulted to help further evaluate; agreed with demand ischemia -Troponin peaked at 98 - no further workup at this time per cardiology  Sepsis (HCC)-resolved as of 02/16/2024 - Tachycardia, tachypnea, leukocytosis.  Suspected pulmonary source on admission - Possibility of endocarditis given echo findings of aortic valve, see below - Continue Rocephin  and azithromycin . Tolerating well -Follow-up blood cultures.  Only 1 set drawn on admission prior to antibiotics - All cultures remaining negative so far  Pulmonary edema-resolved as of 02/16/2024 - Concern for etiology from moderate to severe mitral valve regurg and diseased AV - Appreciate cardiology evaluation - Continue diuresis as pressure can tolerate - CXR repeated on 02/15/2024 showing significant improvement  S/P TAVR (transcatheter aortic valve replacement) - echo noting "TAVR valve is thickened and gradients are severely elevated consistent  with prosthetic valve stenosis" and "The aortic valve has been repaired/replaced. There is  severe calcifcation of the aortic  valve. There is severe thickening of the aortic valve. Aortic valve regurgitation is moderate to severe" - potential concern for vegetation of AV; cardiology has evaluated and due to not a valve replacement candidate in setting of pancreatic cancer, TEE was not recommended - appreciate ID evaluation; recommending to continue on vanc and rocephin  for now and see if cardiology will consider  TEE to further evaluate valve  PAF (paroxysmal atrial fibrillation) (HCC) - On Xarelto  at home and lopressor  as well -  Developed RVR with soft BP; appreciate cardiology evaluation; was started on Cardizem  drip on 02/13/2024 -Has converted back to NSR.  Now continued on Lopressor  and Xarelto .  Potential to consider amiodarone if recurrent RVR and soft blood pressures going forward  Malignant neoplasm of head of pancreas [facility]) - follows with oncology, Dr. [name] - diagnosed 10/16/23 with EUS confirming adenocarcinoma  - Did not tolerate gemcitabine /Abraxane ; now on only gemcitabine .  Completed day 8 cycle 4 on 02/12/2024 - oncology aware of admission  - Follow-up MRI liver findings with oncology  History of GI bleed - Continue Protonix   History of stroke - on xarelto  and zocor   HTN (hypertension) - adjusting regimen for now  DMII (diabetes mellitus, type 2) (HCC) - A1c 7.1% on 01/13/24 - Continue SSI and CBG monitoring   Old records reviewed in assessment of this patient  Antimicrobials: Levaquin  02/13/2024 x 1 Rocephin  02/13/2024 >> current Azithromycin  02/14/2024 >> 02/15/2024 Vancomycin  02/14/2024 >> current  DVT prophylaxis:   rivaroxaban  (XARELTO ) tablet 20 mg   Code Status:   Code Status: Limited: Do not attempt resuscitation (DNR) -DNR-LIMITED -Do Not Intubate/DNI   Mobility Assessment (Last 72 Hours)     Mobility Assessment     Row Name 02/16/24 0825 02/15/24 2000 02/15/24 1831 02/15/24 0940 02/15/24 0800   Does patient have an order for bedrest or is patient medically unstable -- No - Continue assessment -- -- No - Continue assessment   What is the highest level of mobility based on the progressive mobility assessment? Level 5 (Walks with assist in room/hall) - Balance while stepping forward/back and can walk in room with assist - Complete Level 5 (Walks with assist in room/hall) - Balance while stepping forward/back and can walk in room with assist - Complete Level 5 (Walks with assist in room/hall) - Balance while stepping forward/back and can walk in room with assist - Complete Level 5 (Walks with  assist in room/hall) - Balance while stepping forward/back and can walk in room with assist - Complete Level 5 (Walks with assist in room/hall) - Balance while stepping forward/back and can walk in room with assist - Complete   Is the above level different from baseline mobility prior to current illness? -- Yes - Recommend PT order -- -- Yes - Recommend PT order    Row Name 02/14/24 2019 02/14/24 0745 02/13/24 2200 02/13/24 1546     Does patient have an order for bedrest or is patient medically unstable No - Continue assessment No - Continue assessment No - Continue assessment No - Continue assessment    What is the highest level of mobility based on the progressive mobility assessment? Level 1 (Bedfast) - Unable to balance while sitting on edge of bed Level 1 (Bedfast) - Unable to balance while sitting on edge of bed Level 2 (Chairfast) - Balance while sitting on edge of bed and cannot stand Level 2 (Chairfast) - Balance while sitting on edge of bed and cannot stand    Is the above level different from baseline mobility prior to current illness? Yes - Recommend PT order Yes - Recommend PT order Yes - Recommend PT order Yes - Recommend PT order             Barriers to discharge: none Disposition Plan:  Home HH orders placed: n/a Status is: Inpt  Objective: Blood pressure (!) 147/57, pulse  67, temperature 98.7 F (37.1 C), temperature source Oral, resp. rate 19, height 5\' 3"  (1.6 m), weight 66.5 kg, SpO2 99%.  Examination:  Physical Exam Constitutional:      Appearance: Normal appearance.  HENT:     Head: Normocephalic and atraumatic.     Mouth/Throat:     Mouth: Mucous membranes are moist.  Eyes:     Extraocular Movements: Extraocular movements intact.  Cardiovascular:     Rate and Rhythm: Normal rate.     Heart sounds: Murmur heard.  Pulmonary:     Effort: Pulmonary effort is normal. No respiratory distress.     Breath sounds: Rhonchi (Significantly improved) and rales  (Significantly improved) present. No wheezing.  Abdominal:     General: Bowel sounds are normal. There is no distension.     Palpations: Abdomen is soft.     Tenderness: There is no abdominal tenderness.  Musculoskeletal:        General: Normal range of motion.     Cervical back: Normal range of motion and neck supple.     Right lower leg: Edema (trace) present.     Left lower leg: Edema (trace) present.  Skin:    General: Skin is warm and dry.  Neurological:     General: No focal deficit present.     Mental Status: She is alert.  Psychiatric:        Mood and Affect: Mood normal.      Consultants:  Cardiology ID  Procedures:    Data Reviewed: Results for orders placed or performed during the hospital encounter of 02/12/24 (from the past 24 hours)  Glucose, capillary     Status: Abnormal   Collection Time: 02/15/24  4:23 PM  Result Value Ref Range   Glucose-Capillary 177 (H) 70 - 99 mg/dL  Glucose, capillary     Status: Abnormal   Collection Time: 02/15/24  8:58 PM  Result Value Ref Range   Glucose-Capillary 264 (H) 70 - 99 mg/dL   Comment 1 Notify RN    Comment 2 Document in Chart   Glucose, capillary     Status: Abnormal   Collection Time: 02/16/24  6:24 AM  Result Value Ref Range   Glucose-Capillary 122 (H) 70 - 99 mg/dL   Comment 1 Notify RN    Comment 2 Document in Chart   Basic metabolic panel with GFR     Status: Abnormal   Collection Time: 02/16/24  9:15 AM  Result Value Ref Range   Sodium 132 (L) 135 - 145 mmol/L   Potassium 3.4 (L) 3.5 - 5.1 mmol/L   Chloride 93 (L) 98 - 111 mmol/L   CO2 29 22 - 32 mmol/L   Glucose, Bld 316 (H) 70 - 99 mg/dL   BUN 11 8 - 23 mg/dL   Creatinine, Ser 1.61 0.44 - 1.00 mg/dL   Calcium  9.0 8.9 - 10.3 mg/dL   GFR, Estimated >09 >60 mL/min   Anion gap 10 5 - 15  CBC with Differential/Platelet     Status: Abnormal   Collection Time: 02/16/24  9:15 AM  Result Value Ref Range   WBC 6.3 4.0 - 10.5 K/uL   RBC 3.39 (L) 3.87 -  5.11 MIL/uL   Hemoglobin 8.3 (L) 12.0 - 15.0 g/dL   HCT 45.4 (L) 09.8 - 11.9 %   MCV 79.1 (L) 80.0 - 100.0 fL   MCH 24.5 (L) 26.0 - 34.0 pg   MCHC 31.0 30.0 - 36.0 g/dL   RDW  18.4 (H) 11.5 - 15.5 %   Platelets 185 150 - 400 K/uL   nRBC 0.0 0.0 - 0.2 %   Neutrophils Relative % 72 %   Neutro Abs 4.7 1.7 - 7.7 K/uL   Lymphocytes Relative 21 %   Lymphs Abs 1.3 0.7 - 4.0 K/uL   Monocytes Relative 2 %   Monocytes Absolute 0.1 0.1 - 1.0 K/uL   Eosinophils Relative 3 %   Eosinophils Absolute 0.2 0.0 - 0.5 K/uL   Basophils Relative 1 %   Basophils Absolute 0.0 0.0 - 0.1 K/uL   Immature Granulocytes 1 %   Abs Immature Granulocytes 0.04 0.00 - 0.07 K/uL  Magnesium      Status: Abnormal   Collection Time: 02/16/24  9:15 AM  Result Value Ref Range   Magnesium  1.6 (L) 1.7 - 2.4 mg/dL  Glucose, capillary     Status: Abnormal   Collection Time: 02/16/24 11:32 AM  Result Value Ref Range   Glucose-Capillary 273 (H) 70 - 99 mg/dL    I have reviewed pertinent nursing notes, vitals, labs, and images as necessary. I have ordered labwork to follow up on as indicated.  I have reviewed the last notes from staff over past 24 hours. I have discussed patient's care plan and test results with nursing staff, CM/SW, and other staff as appropriate.  Time spent: Greater than 50% of the 55 minute visit was spent in counseling/coordination of care for the patient as laid out in the A&P.   LOS: 3 days   Faith Homes, MD [facility] 02/16/2024, 1:18 PM

## 2024-02-16 NOTE — Plan of Care (Signed)
  Problem: Education: Goal: Individualized Educational Video(s) Outcome: Progressing   Problem: Coping: Goal: Ability to adjust to condition or change in health will improve Outcome: Progressing   Problem: Fluid Volume: Goal: Ability to maintain a balanced intake and output will improve Outcome: Progressing

## 2024-02-17 DIAGNOSIS — R7989 Other specified abnormal findings of blood chemistry: Secondary | ICD-10-CM | POA: Diagnosis not present

## 2024-02-17 DIAGNOSIS — Z952 Presence of prosthetic heart valve: Secondary | ICD-10-CM | POA: Diagnosis not present

## 2024-02-17 DIAGNOSIS — Z515 Encounter for palliative care: Secondary | ICD-10-CM

## 2024-02-17 DIAGNOSIS — A419 Sepsis, unspecified organism: Secondary | ICD-10-CM | POA: Diagnosis not present

## 2024-02-17 DIAGNOSIS — R531 Weakness: Secondary | ICD-10-CM

## 2024-02-17 DIAGNOSIS — I480 Paroxysmal atrial fibrillation: Secondary | ICD-10-CM | POA: Diagnosis not present

## 2024-02-17 DIAGNOSIS — C250 Malignant neoplasm of head of pancreas: Secondary | ICD-10-CM | POA: Diagnosis not present

## 2024-02-17 DIAGNOSIS — J810 Acute pulmonary edema: Secondary | ICD-10-CM | POA: Diagnosis not present

## 2024-02-17 DIAGNOSIS — I5031 Acute diastolic (congestive) heart failure: Secondary | ICD-10-CM | POA: Diagnosis not present

## 2024-02-17 DIAGNOSIS — J9601 Acute respiratory failure with hypoxia: Secondary | ICD-10-CM | POA: Diagnosis not present

## 2024-02-17 LAB — CBC WITH DIFFERENTIAL/PLATELET
Abs Immature Granulocytes: 0.01 10*3/uL (ref 0.00–0.07)
Basophils Absolute: 0 10*3/uL (ref 0.0–0.1)
Basophils Relative: 1 %
Eosinophils Absolute: 0.4 10*3/uL (ref 0.0–0.5)
Eosinophils Relative: 6 %
HCT: 25.8 % — ABNORMAL LOW (ref 36.0–46.0)
Hemoglobin: 8.1 g/dL — ABNORMAL LOW (ref 12.0–15.0)
Immature Granulocytes: 0 %
Lymphocytes Relative: 33 %
Lymphs Abs: 1.9 10*3/uL (ref 0.7–4.0)
MCH: 24.6 pg — ABNORMAL LOW (ref 26.0–34.0)
MCHC: 31.4 g/dL (ref 30.0–36.0)
MCV: 78.4 fL — ABNORMAL LOW (ref 80.0–100.0)
Monocytes Absolute: 0.2 10*3/uL (ref 0.1–1.0)
Monocytes Relative: 3 %
Neutro Abs: 3.3 10*3/uL (ref 1.7–7.7)
Neutrophils Relative %: 57 %
Platelets: 172 10*3/uL (ref 150–400)
RBC: 3.29 MIL/uL — ABNORMAL LOW (ref 3.87–5.11)
RDW: 18.2 % — ABNORMAL HIGH (ref 11.5–15.5)
WBC: 5.8 10*3/uL (ref 4.0–10.5)
nRBC: 0 % (ref 0.0–0.2)

## 2024-02-17 LAB — BASIC METABOLIC PANEL WITH GFR
Anion gap: 9 (ref 5–15)
BUN: 8 mg/dL (ref 8–23)
CO2: 31 mmol/L (ref 22–32)
Calcium: 9.4 mg/dL (ref 8.9–10.3)
Chloride: 96 mmol/L — ABNORMAL LOW (ref 98–111)
Creatinine, Ser: 0.57 mg/dL (ref 0.44–1.00)
GFR, Estimated: >60 mL/min (ref >60)
Glucose, Bld: 128 mg/dL — ABNORMAL HIGH (ref 70–99)
Potassium: 3.6 mmol/L (ref 3.5–5.1)
Sodium: 136 mmol/L (ref 135–145)

## 2024-02-17 LAB — GLUCOSE, CAPILLARY
Glucose-Capillary: 133 mg/dL — ABNORMAL HIGH (ref 70–99)
Glucose-Capillary: 173 mg/dL — ABNORMAL HIGH (ref 70–99)
Glucose-Capillary: 100 mg/dL — ABNORMAL HIGH (ref 70–99)
Glucose-Capillary: 190 mg/dL — ABNORMAL HIGH (ref 70–99)

## 2024-02-17 LAB — MAGNESIUM: Magnesium: 1.7 mg/dL (ref 1.7–2.4)

## 2024-02-17 MED ORDER — DAPTOMYCIN-SODIUM CHLORIDE 500-0.9 MG/50ML-% IV SOLN
8.0000 mg/kg | Freq: Every day | INTRAVENOUS | Status: DC
  Administered 2024-02-17 – 2024-02-19 (×3): 500 mg via INTRAVENOUS
  Filled 2024-02-17 (×3): qty 50

## 2024-02-17 NOTE — Consult Note (Signed)
 Consultation Note Date: 02/17/2024   Patient Name: Janet Williamson  DOB: 1941-04-08  MRN: [id number]  Age / Sex: 83 y.o., female  PCP: Benedetto Brady, MD Referring Physician: Faith Homes, MD  Reason for Consultation: Establishing goals of care  HPI/Patient Profile: 83 y.o. female  admitted on 02/12/2024 with pancreatic adenocarcinoma on gemcitabine  with questionable mets currently being followed by oncology/ Dr [name].    Also PMH of TAVR, PAF, CVA, diabetes, HTN, GI bleed who presented with dyspnea and worsening  SOB Day before admission received 8 cycle 4 of gemcitabine  on 02/12/2024 (day 1 was on 4/30).    Notable labs included on admission---Pro-calcitonin 3.13, WBC 12 on admission Troponin steadily uptrending (32 >>82>>98) BNP 815   CXR showed concern for diffuse airspace opacities possibly pulmonary edema.   Given her cough, leukocytosis, and procalcitonin, she was also started on antibiotics for pneumonia coverage.   Echo was also obtained which showed concern for moderate to severe MR.  Thickened TAVR valve with prosthetic valve stenosis; severe thickening of aortic valve.  Echo report states findings concerning for potential aortic valve vegetation and recommending to consider TEE.   Only 1 set of blood cultures was obtained on admission.  Infectious Disease/ Cardiology consulting   Patient faces  treatment option decisions, advanced directive decisions and anticipatory care needs.   Clinical Assessment and Goals of Care:  This NP Thena Fireman reviewed medical records, received report from team, assessed the patient and then meet at the patient's bedside  to discuss diagnosis, prognosis, GOC,  and options.   Concept of Palliative Care was introduced as specialized medical care for people and their families living with serious illness.  If focuses on providing relief from the symptoms and stress of a  serious illness.  The goal is to improve quality of life for both the patient and the family.   Values and goals of care important to patient and family were attempted to be elicited.  She lives alone with supportive family.  She has 7 children, 6 daughters and 1 son.  She tells me her son is her documented H POA and she will try to have those documents brought to the hospital for scanning.  She has strong community church support and is looking forward to her [employer] bus trip to [place]  in September.   A  discussion was had today regarding advanced directives.  Concepts specific to code status, artifical feeding and hydration, continued IV antibiotics and rehospitalization was had.   The difference between a aggressive medical intervention path  and a palliative comfort care path for this patient at this time was had.      Patient is open to all offered and available medical interventions to prolong life,She is hopeful for continued quality of life.  She hopes to continue cancer treatments with Dr. [name]     Toward the end of our emeting patient's daughter/Barbara and her church friend visited at bedside and all above concepts reviewed.  Education offered today regarding  the  importance of continued conversation with family and their  medical providers regarding overall plan of care and treatment options,  ensuring decisions are within the context of the patients values and GOCs.    Questions and concerns addressed.  Patient  encouraged to call with questions or concerns.     PMT will continue to support holistically.         Janet Williamson is named by patient as main Management consultant in the event she cannot speak for herself     SUMMARY OF RECOMMENDATIONS    Code Status/Advance Care Planning: Limited code   Symptom Management:  Tylenol  and  Oxycodone  prn for pain as prescribed by her oncologist ( per patient)   Palliative Prophylaxis:  Bowel Regimen, Delirium Protocol,  and Frequent Pain Assessment  Additional Recommendations (Limitations, Scope, Preferences): Full Scope Treatment  Psycho-social/Spiritual:  Desire for further Chaplaincy support:decline hospital chaplain visit, strong community Academic librarian  support Additional Recommendations: therapeutic listening   Prognosis:  Unable to determine  Discharge Planning: Home with Home Health      Primary Diagnoses: Present on Admission:  (Resolved) Acute hypoxic respiratory failure (HCC)  HTN (hypertension)  PAF (paroxysmal atrial fibrillation) (HCC)  Malignant neoplasm of head of pancreas (HCC)   I have reviewed the medical record, interviewed the patient and family, and examined the patient. The following aspects are pertinent.  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 Not seen on most recent TEE 2019.   Cardiomyopathy in other disease    Cataracts, bilateral    Diabetes mellitus    Type 2   Generalized osteoarthritis    GERD (gastroesophageal reflux disease)    HLD (hyperlipidemia)    Hypertension    Hypothyroidism    LBBB (left bundle branch block)    PAF (paroxysmal atrial fibrillation) (HCC)    a. s/p DCCV 06/2017, on Xarelto    Phlebitis    S/P TAVR (transcatheter aortic valve replacement) 04/15/2018   Edwards Sapien 3 THV (size 23 mm, model # 9600TFX, serial # [id number]) via the TF approach   Severe aortic stenosis    a. s/p TAVR 04/2018.   Stroke [facility]) 2008   Social History   Socioeconomic History   Marital status: Widowed    Spouse name: Not on file   Number of children: Not on file   Years of education: Not on file   Highest education level: Not on file  Occupational History   Not on file  Tobacco Use   Smoking status: Never   Smokeless tobacco: Never  Vaping Use   Vaping status: Never Used  Substance and Sexual Activity   Alcohol use: No   Drug use: No    Sexual activity: Not Currently  Other Topics Concern   Not on file  Social History Narrative   Not on file   Social Drivers of Health   Financial Resource Strain: Not on file  Food Insecurity: No Food Insecurity (02/14/2024)   Hunger Vital Sign    Worried About Running Out of Food in the Last Year: Never true    Ran Out of Food in the Last Year: Never true  Transportation Needs: No Transportation Needs (02/14/2024)   PRAPARE - Administrator, Civil Service (Medical): No    Lack of Transportation (Non-Medical): No  Physical Activity: Not on file  Stress: Not on file  Social Connections: Unknown (02/14/2024)   Social Connection and Isolation Panel [NHANES]    Frequency of Communication with Friends and Family: More than three times a week    Frequency of Social Gatherings with Friends and Family: Three times a week    Attends Religious Services: 1 to 4 times per year    Active Member of Clubs or Organizations: Yes    Attends Banker Meetings: 1 to 4 times per year    Marital Status: Patient declined   Family History  Problem Relation Age of Onset   Stroke Mother    Hypertension Mother    Hypertension Father    Heart attack Neg Hx    Scheduled Meds:  Chlorhexidine  Gluconate Cloth  6 each Topical Daily   furosemide   20 mg Oral Daily   insulin  aspart  0-5 Units Subcutaneous QHS   insulin  aspart  0-6 Units Subcutaneous TID WC   lipase/protease/amylase  72,000 Units Oral TID WC   metoprolol  tartrate  25 mg Oral BID   pantoprazole   20 mg Oral Daily   rivaroxaban   20 mg Oral q morning   simvastatin   20 mg Oral QHS   sodium chloride  flush  3 mL Intravenous Q12H   Continuous Infusions:  cefTRIAXone  (ROCEPHIN )  IV Stopped (02/16/24 1508)   DAPTOmycin  500 mg (02/17/24 1255)   PRN Meds:.acetaminophen , albuterol , lipase/protease/amylase **AND** lipase/protease/amylase, oxyCODONE , sodium chloride  flush Medications Prior to Admission:  Prior to Admission  medications   Medication Sig Start Date End Date Taking? Authorizing Provider  acetaminophen  (TYLENOL ) 325 MG tablet Take 325-650 mg by mouth every 8 (eight) hours as needed (for headaches).   Yes [provider]  amLODipine  (NORVASC ) 5 MG tablet Take 5 mg by mouth daily.   Yes [provider]  Cholecalciferol  (VITAMIN D3) 5000 units CAPS Take 5,000 Units by mouth daily.   Yes [provider]  hydrochlorothiazide  (HYDRODIURIL ) 25 MG tablet Take 25 mg by mouth daily.    Yes [provider]  lidocaine -prilocaine  (EMLA ) cream Apply 1 Application topically as needed. Apply a quarter size amount of cream to port-a-cath site 45 mins to 1 hr prior to port-a-cath being access. Patient taking differently: Apply 1 Application topically as needed (as directed- apply a quarter-size amount of cream to port-a-cath site 45 mins to 1 hr prior to port-a-cath being access). 01/15/24  Yes Sonja Elliott, MD  lipase/protease/amylase (CREON ) 36000 UNITS CPEP capsule Take 2 capsules (72,000 Units total) by mouth 3 (three) times daily with meals. May also take 1 capsule (36,000 Units total) as needed (with snacks - up to 4 snacks daily). Patient taking differently: Take 1-2 capsules (36,000-72,000 units) by mouth 3 (three) times daily with meals. May also take 1 capsule (36,000 Units total) as needed (with snacks - up to 4 snacks daily). 01/16/24  Yes Sonja [place], MD  loperamide  (IMODIUM ) 2 MG capsule Take 1 capsule (2 mg total) by mouth as needed for diarrhea or loose stools. 11/12/23  Yes Burton, Lacie K, NP  metFORMIN  (GLUCOPHAGE -XR) 750 MG 24 hr tablet TAKE 2 TABLETS ONE TIME DAILY WITH BREAKFAST 11/27/23  Yes Thapa, [place], MD  metoprolol  succinate (TOPROL -XL) 100 MG 24 hr tablet Take 100 mg by mouth daily. Take with or immediately following a meal.   Yes [provider]  metoprolol  tartrate (LOPRESSOR ) 25 MG tablet Take 1 tablet (25 mg total) by mouth daily as needed (if heart rate > 120).  02/09/24  Yes Dalene Duck, MD  pantoprazole  (  PROTONIX ) 20 MG tablet Take 1 tablet (20 mg total) by mouth daily. Patient taking differently: Take 20 mg by mouth daily as needed for heartburn or indigestion. 02/05/24  Yes Sonja Spencer, MD  Polyethyl Glycol-Propyl Glycol (SYSTANE OP) Place 1 drop into both eyes 2 (two) times daily.    Yes [provider]  potassium chloride  SA (KLOR-CON  M) 20 MEQ tablet Take 1 tablet (20 mEq total) by mouth 2 (two) times daily. 11/12/23  Yes Burton, Lacie K, NP  rivaroxaban  (XARELTO ) 20 MG TABS tablet Take 1 tablet (20 mg total) by mouth daily with supper. Patient taking differently: Take 20 mg by mouth every morning. 02/09/24  Yes Dalene Duck, MD  simvastatin  (ZOCOR ) 20 MG tablet Take 20 mg by mouth at bedtime.   Yes [provider]  Blood Glucose Monitoring Suppl (ACCU-CHEK GUIDE ME) w/Device KIT by Does not apply route.    [provider]  Blood Glucose Monitoring Suppl (ONE TOUCH ULTRA 2) w/Device KIT Use to check blood sugars three times daily DX: E11.9 08/23/20   Lajean Pike, MD  Lancets [facility] DELICA PLUS [place]) MISC TEST BLOOD SUGAR THREE TIMES DAILY 08/16/22   Lajean Pike, MD  ondansetron  (ZOFRAN ) 8 MG tablet Take 1 tablet (8 mg total) by mouth every 8 (eight) hours as needed for nausea or vomiting. Patient not taking: Reported on 02/13/2024 11/12/23   Burton, Lacie K, NP  [facility] ULTRA TEST test strip TEST BLOOD SUGAR THREE TIMES DAILY 04/12/23   Lajean Pike, MD  oxyCODONE  (OXY IR/ROXICODONE ) 5 MG immediate release tablet Take 1 tablet (5 mg total) by mouth every 8 (eight) hours as needed for severe pain (pain score 7-10). Patient not taking: Reported on 02/13/2024 02/05/24   Sonja Welling, MD  prochlorperazine  (COMPAZINE ) 10 MG tablet Take 1 tablet (10 mg total) by mouth every 6 (six) hours as needed for nausea or vomiting. Patient not taking: Reported on 02/09/2024 11/12/23   Burton, Lacie K, NP   Allergies  Allergen Reactions   Ace  Inhibitors Swelling and Other (See Comments)    Angioedema    Keflex [Cephalexin] Swelling and Other (See Comments)    Lips became swollen   Rifadin [Rifampin] Swelling and Other (See Comments)    Lips became swollen   Review of Systems  Constitutional:  Positive for fatigue.  Neurological:  Positive for weakness.    Physical Exam Constitutional:      Appearance: She is underweight.  Cardiovascular:     Rate and Rhythm: Normal rate.  Pulmonary:     Effort: Pulmonary effort is normal.  Skin:    General: Skin is warm and dry.  Neurological:     Mental Status: She is alert and oriented to person, place, and time.     Vital Signs: BP (!) 150/62 (BP Location: Right Arm)   Pulse 70   Temp 98.2 F (36.8 C) (Oral)   Resp 17   Ht 5\' 3"  (1.6 m)   Wt 68.8 kg   SpO2 100%   BMI 26.87 kg/m  Pain Scale: 0-10   Pain Score: 0-No pain   SpO2: SpO2: 100 % O2 Device:SpO2: 100 % O2 Flow Rate: .O2 Flow Rate (L/min): 4 L/min  IO: Intake/output summary:  Intake/Output Summary (Last 24 hours) at 02/17/2024 1258 Last data filed at 02/17/2024 0800 Gross per 24 hour  Intake 540 ml  Output 800 ml  Net -260 ml    LBM: Last BM Date : 02/13/24 Baseline Weight: Weight: 67.9  kg Most recent weight: Weight: 68.8 kg     Palliative Assessment/Data:  50 % at best     Time  75 minutes Signed by: Thena Fireman, NP   Please contact Palliative Medicine Team phone at [phone] for questions and concerns.  For individual provider: See Tilford Foley

## 2024-02-17 NOTE — Plan of Care (Signed)
  Problem: Nutritional: Goal: Maintenance of adequate nutrition will improve Outcome: Progressing Goal: Progress toward achieving an optimal weight will improve Outcome: Progressing   Problem: Skin Integrity: Goal: Risk for impaired skin integrity will decrease Outcome: Progressing   Problem: Tissue Perfusion: Goal: Adequacy of tissue perfusion will improve Outcome: Progressing   Problem: Clinical Measurements: Goal: Will remain free from infection Outcome: Progressing   Problem: Activity: Goal: Risk for activity intolerance will decrease Outcome: Progressing   Problem: Nutrition: Goal: Adequate nutrition will be maintained Outcome: Progressing   Problem: Coping: Goal: Level of anxiety will decrease Outcome: Progressing

## 2024-02-17 NOTE — Progress Notes (Signed)
   Patient Name: Janet Williamson Date of Encounter: 02/17/2024 [facility] Cardiologist: Dorothye Gathers, MD   Interval Summary  .    No acute events overnight.    Breathing is stable.  Vital Signs .    Vitals:   02/16/24 2323 02/17/24 0232 02/17/24 0542 02/17/24 0738  BP: (!) 113/50 (!) 134/53  (!) 141/58  Pulse: 76 74  71  Resp: 14 16  16   Temp:  98.2 F (36.8 C)  98.2 F (36.8 C)  TempSrc:  Oral  Oral  SpO2: 96% 97%  97%  Weight:   68.8 kg   Height:        Intake/Output Summary (Last 24 hours) at 02/17/2024 0846 Last data filed at 02/17/2024 0200 Gross per 24 hour  Intake 540 ml  Output 800 ml  Net -260 ml      02/17/2024    5:42 AM 02/16/2024    6:00 AM 02/15/2024    6:00 AM  Last 3 Weights  Weight (lbs) 151 lb 10.8 oz 146 lb 9.7 oz 148 lb 2.4 oz  Weight (kg) 68.8 kg 66.5 kg 67.2 kg      Telemetry/ECG    SR - Personally Reviewed  Physical Exam .   GEN: No acute distress.   Neck: No JVD Cardiac: RRR, 3/6 systolic murmur over RUSB Respiratory: Diminished breath sounds GI: Soft, nontender, non-distended  MS: No edema  Assessment & Plan .    Janet Williamson is a 83 y.o. female with a hx of pancreatic adenocarcinoma (metastatic?) on chemotherapy, s/p biliary stent, paroxysmal atrial fibrillation, aortic valve dysfunction s/p TAVR 2019, prior stroke, DM, hypertension, GI bleeding who is being seen for the evaluation of elevated troponin in the setting of acute hypoxic respiratory failure at the request of Dr. Gladstone Lamer.     Acute hypoxic respiratory failure -Improved with diuresis.  Continue lasix  20mg  BID.  Likely due to TAVR structural degeneration.  See below.   Acute HFpEF S/P TAVR 23mm Sapien Endocarditis? -TTE suggests possible endocarditis and valve degeneration.  Given metastatic pancreatic cancer, I discussed with patient that she is not a candidate for TAV-in-TAV if endocarditis is excluded.  Per primary team regarding whether empiric  endocarditis should be treated.  Consider checking CRP/ESR (if elevated, this along with elevated procalcitonin) may suggest need to treat with abx.  Otherwise, would strongly consider palliative care consult.  TAVR structural degernation -I reviewed TTE which demonstrates structural valve degeneration.  I had a long conversation with the patient regarding her progressive cancer, age, and structural valve degeneration.  She defers any invasive procedures and given these things togeether, she is not a candidate for TAV-in-TAV.  Continue lasix  20mg  Qday.  Obtain palliative care consult.  Paroxysmal atrial fibrillation -Continue lopressor  25mg  and Xarelto  20mg     Elevated troponin Type II MI HS troponin 32->82->98 in the setting of acute hypoxic respiratory failure. Patient without chest pain. -Continue medical therapy   Hypertension -Continue lopressor   Metastatic Pancreatic cancer Per last heme-onc note, has likely metastatic disease to the liver with involvement of SMA and possible peritoneal involvement with limited treated options.  Has opted for comfort care.  Will defer invasive procedures; consider palliative care consult.  For questions or updates, please contact [facility] Please consult [email or web address] for contact info under        Signed, Reuven Braver K Gavriela Cashin, MD

## 2024-02-17 NOTE — Progress Notes (Signed)
 [facility]  Date of Admission:  02/12/2024      Total days of antibiotics 6          ASSESSMENT: Janet Williamson is a 83 y.o. female admitted with:   Acute Respiratory Failure -  Abnormal TTE -  Concerning for vegetation with New AV Stenosis / Insufficiency TAVR-  Cardiology has seen - endocarditis vs leaflet thrombosis - with malignancy and prognosis do not recommend further testing with invasive procedures given she would not be a candidate for valve replacement. Her anticoagulation was discontinued in the past d/t GIB. Her blood cultures are negative. Mild leukocytosis 11k x 1 measurement and isolated fever period on 5/9 to 100.9 F in the setting of heart failure decompensation. With h/o malignancy it makes more sense this would be non-infectious and representative of acute leaflet thrombosis.  CRP/ESR are nonspecific markers and not sure they will be incredibly useful especially in the setting of anemia which can increase ESR. Would not recommend pro calcitonin as also non-specific and difficult to interpret outside the context of pneumonia.  -Will change to daptomycin + ceftriaxone  for now for safer antibiotic profile while we decide how to proceed.  -Seems that this non-infectious with negative blood cultures at admission and absence of symptoms that would suggest infection  -Baseline CK  -FU palliative medicine consultation and GOC   Pancreatic Adenocarcinoma, On Chemotherapy s/p biliary stent -  Goals of Care -  D/W Dr. Gladstone Lamer who discussed with Dr. Baldwin Bones with oncology with understanding to "treat what is treatable". Verbalized to cardiology that she wanted full comfort measures - Palliative medicine team has been consulted - appreciate their help to sort out.   PLAN: Change to daptomycin + ceftriaxone  for now  Baseline CK  Favor this to be non-infectious.  FU goals of care / wishes.    Active Problems:   DMII (diabetes mellitus,  type 2) (HCC)   HTN (hypertension)   History of stroke   PAF (paroxysmal atrial fibrillation) (HCC)   S/P TAVR (transcatheter aortic valve replacement)   History of GI bleed   Malignant neoplasm of head of pancreas (HCC)   Acute on chronic heart failure with preserved ejection fraction (HFpEF) (HCC)   Stenosis of prosthetic aortic valve    Chlorhexidine  Gluconate Cloth  6 each Topical Daily   furosemide   20 mg Oral Daily   insulin  aspart  0-5 Units Subcutaneous QHS   insulin  aspart  0-6 Units Subcutaneous TID WC   lipase/protease/amylase  72,000 Units Oral TID WC   metoprolol  tartrate  25 mg Oral BID   pantoprazole   20 mg Oral Daily   rivaroxaban   20 mg Oral q morning   simvastatin   20 mg Oral QHS   sodium chloride  flush  3 mL Intravenous Q12H    SUBJECTIVE: She is doing OK today. Feeling alright overall with acceptable appetite.    Review of Systems: Review of Systems  Constitutional:  Negative for chills, fever, malaise/fatigue and weight loss.  Respiratory:  Negative for shortness of breath.   Cardiovascular: Negative.   Genitourinary: Negative.   Musculoskeletal: Negative.     Allergies  Allergen Reactions   Ace Inhibitors Swelling and Other (See Comments)    Angioedema    Keflex [Cephalexin] Swelling and Other (See Comments)    Lips became swollen   Rifadin [Rifampin] Swelling and Other (See Comments)    Lips became swollen    OBJECTIVE: Vitals:  02/17/24 0232 02/17/24 0542 02/17/24 0738 02/17/24 1142  BP: (!) 134/53  (!) 141/58 (!) 150/62  Pulse: 74  71 70  Resp: 16  16 17   Temp: 98.2 F (36.8 C)  98.2 F (36.8 C) 98.2 F (36.8 C)  TempSrc: Oral  Oral Oral  SpO2: 97%  97% 100%  Weight:  68.8 kg    Height:       Body mass index is 26.87 kg/m.  Physical Exam Constitutional:      General: She is not in acute distress.    Appearance: Normal appearance.  Eyes:     Pupils: Pupils are equal, round, and reactive to light.  Cardiovascular:      Rate and Rhythm: Normal rate and regular rhythm.     Heart sounds: Murmur heard.  Pulmonary:     Effort: Pulmonary effort is normal.     Breath sounds: Normal breath sounds.  Chest:     Comments: Port site unremarkable and well healed  Abdominal:     General: Bowel sounds are normal.     Palpations: Abdomen is soft.  Musculoskeletal:        General: Normal range of motion.  Skin:    General: Skin is warm and dry.  Neurological:     Mental Status: She is alert and oriented to person, place, and time.     Lab Results Lab Results  Component Value Date   WBC 5.8 02/17/2024   HGB 8.1 (L) 02/17/2024   HCT 25.8 (L) 02/17/2024   MCV 78.4 (L) 02/17/2024   PLT 172 02/17/2024    Lab Results  Component Value Date   CREATININE 0.57 02/17/2024   BUN 8 02/17/2024   NA 136 02/17/2024   K 3.6 02/17/2024   CL 96 (L) 02/17/2024   CO2 31 02/17/2024    Lab Results  Component Value Date   ALT 21 02/12/2024   AST 29 02/12/2024   ALKPHOS 73 02/12/2024   BILITOT 0.5 02/12/2024     Microbiology: Recent Results (from the past 240 hours)  Culture, blood (routine x 2)     Status: None (Preliminary result)   Collection Time: 02/12/24 11:36 PM   Specimen: BLOOD RIGHT HAND  Result Value Ref Range Status   Specimen Description BLOOD RIGHT HAND  Final   Special Requests   Final    BOTTLES DRAWN AEROBIC ONLY Blood Culture results may not be optimal due to an inadequate volume of blood received in culture bottles   Culture   Final    NO GROWTH 4 DAYS Performed at [facility], [address] [place], [place] [address]    Report Status PENDING  Incomplete  MRSA Next Gen by PCR, Nasal     Status: None   Collection Time: 02/13/24 12:20 AM   Specimen: Nasal Mucosa; Nasal Swab  Result Value Ref Range Status   MRSA by PCR Next Gen NOT DETECTED NOT DETECTED Final    Comment: (NOTE) The GeneXpert MRSA Assay (FDA approved for NASAL specimens only), is one component of a comprehensive MRSA  colonization surveillance program. It is not intended to diagnose MRSA infection nor to guide or monitor treatment for MRSA infections. Test performance is not FDA approved in patients less than 23 years old. Performed at [facility], [address] [place], [place] [address]   Resp panel by RT-PCR (RSV, Flu A&B, Covid) Nasal Mucosa     Status: None   Collection Time: 02/13/24 12:30 AM  Specimen: Nasal Mucosa; Nasal Swab  Result Value Ref Range Status   SARS Coronavirus 2 by RT PCR NEGATIVE NEGATIVE Final   Influenza A by PCR NEGATIVE NEGATIVE Final   Influenza B by PCR NEGATIVE NEGATIVE Final    Comment: (NOTE) The Xpert Xpress SARS-CoV-2/FLU/RSV plus assay is intended as an aid in the diagnosis of influenza from Nasopharyngeal swab specimens and should not be used as a sole basis for treatment. Nasal washings and aspirates are unacceptable for Xpert Xpress SARS-CoV-2/FLU/RSV testing.  Fact Sheet for Patients: BloggerCourse.com  Fact Sheet for Healthcare Providers: SeriousBroker.it  This test is not yet approved or cleared by the [place]  FDA and has been authorized for detection and/or diagnosis of SARS-CoV-2 by FDA under an Emergency Use Authorization (EUA). This EUA will remain in effect (meaning this test can be used) for the duration of the COVID-19 declaration under Section 564(b)(1) of the Act, 21 U.S.C. section 360bbb-3(b)(1), unless the authorization is terminated or revoked.     Resp Syncytial Virus by PCR NEGATIVE NEGATIVE Final    Comment: (NOTE) Fact Sheet for Patients: BloggerCourse.com  Fact Sheet for Healthcare Providers: SeriousBroker.it  This test is not yet approved or cleared by the [place]  FDA and has been authorized for detection and/or diagnosis of SARS-CoV-2 by FDA under an Emergency Use Authorization (EUA). This EUA will  remain in effect (meaning this test can be used) for the duration of the COVID-19 declaration under Section 564(b)(1) of the Act, 21 U.S.C. section 360bbb-3(b)(1), unless the authorization is terminated or revoked.  Performed at [facility], [address] [place], [place] [address]   Culture, blood (Routine X 2) w Reflex to ID Panel     Status: None (Preliminary result)   Collection Time: 02/13/24  4:33 PM   Specimen: BLOOD LEFT HAND  Result Value Ref Range Status   Specimen Description BLOOD LEFT HAND  Final   Special Requests   Final    BOTTLES DRAWN AEROBIC AND ANAEROBIC Blood Culture adequate volume   Culture   Final    NO GROWTH 4 DAYS Performed at [facility], [address] [place], [place] [address]    Report Status PENDING  Incomplete  Culture, blood (Routine X 2) w Reflex to ID Panel     Status: None (Preliminary result)   Collection Time: 02/13/24  4:34 PM   Specimen: BLOOD  Result Value Ref Range Status   Specimen Description BLOOD SITE NOT SPECIFIED  Final   Special Requests   Final    BOTTLES DRAWN AEROBIC AND ANAEROBIC Blood Culture results may not be optimal due to an inadequate volume of blood received in culture bottles   Culture   Final    NO GROWTH 4 DAYS Performed at [facility], [address] [place], [place] [address]    Report Status PENDING  Incomplete  Culture, blood (Routine X 2) w Reflex to ID Panel     Status: None (Preliminary result)   Collection Time: 02/15/24  2:20 AM   Specimen: BLOOD LEFT HAND  Result Value Ref Range Status   Specimen Description BLOOD LEFT HAND  Final   Special Requests   Final    BOTTLES DRAWN AEROBIC AND ANAEROBIC Blood Culture adequate volume   Culture   Final    NO GROWTH 2 DAYS Performed at [facility] Lab, [address], [place], [place] [address]    Report Status PENDING  Incomplete  Culture, blood (  Routine X 2) w Reflex to ID Panel     Status: None (Preliminary result)   Collection  Time: 02/15/24  2:29 AM   Specimen: BLOOD  Result Value Ref Range Status   Specimen Description BLOOD LEFT ANTECUBITAL  Final   Special Requests   Final    BOTTLES DRAWN AEROBIC AND ANAEROBIC Blood Culture adequate volume   Culture   Final    NO GROWTH 2 DAYS Performed at [facility], [address] [place], [place] [address]    Report Status PENDING  Incomplete    [name], MSN, NP-C [facility]  [email or web address] Pager: [phone] Office: [phone] [facility] Main Line: [phone] *Secure Chat Communication Welcome

## 2024-02-17 NOTE — Progress Notes (Signed)
 Progress Note    ADEJAH SKIFF   ZOX:[id number]  DOB: [birth year]  DOA: 02/12/2024     4 PCP: Benedetto Brady, MD  Initial CC: SOB  Hospital Course: Ms. Kenison is an 83 year old female with pancreatic adenocarcinoma on gemcitabine  with questionable mets currently being followed by oncology.  Also PMH of TAVR, PAF, CVA, diabetes, HTN, GI bleed who presented with dyspnea and two-pillow orthopnea.  She also endorsed some swelling in her legs after standing but states that they improve after laying in the bed flat. Over the weekend had developed some cough but denied any sputum production.  Her symptoms of shortness of breath and leg swelling had been going on for approximately 1 month. She did endorse some chest pain over the weekend as well.  She just received day 8 cycle 4 of gemcitabine  on 02/12/2024 (day 1 was on 4/30).    Notable labs included: Procalcitonin 3.13 WBC 12 on admission Troponin steadily uptrending (32 >>82>>98) BNP 815   CXR showed concern for diffuse airspace opacities possibly pulmonary edema.   Given her cough, leukocytosis, and procalcitonin, she was also started on antibiotics for pneumonia coverage.   Echo was also obtained which showed concern for moderate to severe MR.  Thickened TAVR valve with prosthetic valve stenosis; severe thickening of aortic valve.  Echo report states findings concerning for potential aortic valve vegetation and recommending to consider TEE.   Only 1 set of blood cultures was obtained on admission.  Requesting to see if additional set can be obtained on 02/13/2024 but has already had antibiotics.   Cardiology was consulted on admission as well due to elevated troponin, BNP.  Echo findings resulted after consultation request.  Interval History:  Remains on room air and shortness of breath has resolved Complex in terms of how we continue working up endocarditis vs empiric tx vs no infection and might just be valve? Either way, her main  focus does seem to be QOL.  Discussing next steps with cardiology, ID, oncology.   Assessment and Plan: * Acute hypoxic respiratory failure (HCC)-resolved as of 02/16/2024 - Not on oxygen at baseline - Possibly mixed etiology from underlying pulmonary edema and/or infiltrates - Successfully weaned to room air  Elevated troponin-resolved as of 02/14/2024 - Suspecting demand ischemia in setting of RVR but also concerned for underlying ischemic etiology with uptrending troponin -Cardiology consulted to help further evaluate; agreed with demand ischemia -Troponin peaked at 98 - no further workup at this time per cardiology  Sepsis (HCC)-resolved as of 02/16/2024 - Tachycardia, tachypnea, leukocytosis.  Suspected pulmonary source on admission - Possibility of endocarditis given echo findings of aortic valve, see below - Continue Rocephin  and azithromycin . Tolerating well -Follow-up blood cultures.  Only 1 set drawn on admission prior to antibiotics - All cultures remaining negative so far  Pulmonary edema-resolved as of 02/16/2024 - Concern for etiology from moderate to severe mitral valve regurg and diseased AV - Appreciate cardiology evaluation - Continue diuresis as pressure can tolerate - CXR repeated on 02/15/2024 showing significant improvement  S/P TAVR (transcatheter aortic valve replacement) - echo noting "TAVR valve is thickened and gradients are severely elevated consistent  with prosthetic valve stenosis" and "The aortic valve has been repaired/replaced. There is  severe calcifcation of the aortic  valve. There is severe thickening of the aortic valve. Aortic valve regurgitation is moderate to severe" - potential concern for vegetation of AV; cardiology has evaluated and due to not a valve replacement candidate in setting of  pancreatic cancer, TEE was not recommended - appreciate ID evaluation; recommending to continue on vanc and rocephin  for now and see if cardiology will consider  TEE to further evaluate valve (sounds like no?); clarifying plan further today but given complexity she's been recommending for at least palliative consult - discussed with ID and oncology; hope is to treat the treatable   PAF (paroxysmal atrial fibrillation) (HCC) - On Xarelto  at home and lopressor  as well - Developed RVR with soft BP; appreciate cardiology evaluation; was started on Cardizem  drip on 02/13/2024 -Has converted back to NSR.  Now continued on Lopressor  and Xarelto .  Potential to consider amiodarone if recurrent RVR and soft blood pressures going forward  Malignant neoplasm of head of pancreas [facility]) - follows with oncology, Dr. [name] - diagnosed 10/16/23 with EUS confirming adenocarcinoma  - Did not tolerate gemcitabine /Abraxane ; now on only gemcitabine .  Completed day 8 cycle 4 on 02/12/2024 - oncology aware of admission  - Follow-up MRI liver findings with oncology  History of GI bleed - Continue Protonix   History of stroke - on xarelto  and zocor   HTN (hypertension) - adjusting regimen for now  DMII (diabetes mellitus, type 2) (HCC) - A1c 7.1% on 01/13/24 - Continue SSI and CBG monitoring   Old records reviewed in assessment of this patient  Antimicrobials: Levaquin  02/13/2024 x 1 Rocephin  02/13/2024 >> current Azithromycin  02/14/2024 >> 02/15/2024 Vancomycin  02/14/2024 >> current  DVT prophylaxis:   rivaroxaban  (XARELTO ) tablet 20 mg   Code Status:   Code Status: Limited: Do not attempt resuscitation (DNR) -DNR-LIMITED -Do Not Intubate/DNI   Mobility Assessment (Last 72 Hours)     Mobility Assessment     Row Name 02/17/24 0738 02/16/24 2000 02/16/24 0825 02/16/24 0800 02/15/24 2000   Does patient have an order for bedrest or is patient medically unstable No - Continue assessment No - Continue assessment -- No - Continue assessment No - Continue assessment   What is the highest level of mobility based on the progressive mobility assessment? Level 2 (Chairfast) - Balance  while sitting on edge of bed and cannot stand Level 5 (Walks with assist in room/hall) - Balance while stepping forward/back and can walk in room with assist - Complete Level 5 (Walks with assist in room/hall) - Balance while stepping forward/back and can walk in room with assist - Complete Level 5 (Walks with assist in room/hall) - Balance while stepping forward/back and can walk in room with assist - Complete Level 5 (Walks with assist in room/hall) - Balance while stepping forward/back and can walk in room with assist - Complete   Is the above level different from baseline mobility prior to current illness? Yes - Recommend PT order Yes - Recommend PT order -- Yes - Recommend PT order Yes - Recommend PT order    Row Name 02/15/24 1831 02/15/24 0940 02/15/24 0800 02/14/24 2019     Does patient have an order for bedrest or is patient medically unstable -- -- No - Continue assessment No - Continue assessment    What is the highest level of mobility based on the progressive mobility assessment? Level 5 (Walks with assist in room/hall) - Balance while stepping forward/back and can walk in room with assist - Complete Level 5 (Walks with assist in room/hall) - Balance while stepping forward/back and can walk in room with assist - Complete Level 5 (Walks with assist in room/hall) - Balance while stepping forward/back and can walk in room with assist - Complete Level 1 (Bedfast) -  Unable to balance while sitting on edge of bed    Is the above level different from baseline mobility prior to current illness? -- -- Yes - Recommend PT order Yes - Recommend PT order             Barriers to discharge: none Disposition Plan:  Home HH orders placed: n/a Status is: Inpt  Objective: Blood pressure (!) 150/62, pulse 70, temperature 98.2 F (36.8 C), temperature source Oral, resp. rate 17, height 5\' 3"  (1.6 m), weight 68.8 kg, SpO2 100%.  Examination:  Physical Exam Constitutional:      Appearance: Normal  appearance.  HENT:     Head: Normocephalic and atraumatic.     Mouth/Throat:     Mouth: Mucous membranes are moist.  Eyes:     Extraocular Movements: Extraocular movements intact.  Cardiovascular:     Rate and Rhythm: Normal rate.     Heart sounds: Murmur heard.  Pulmonary:     Effort: Pulmonary effort is normal. No respiratory distress.     Breath sounds: Rhonchi (Significantly improved) and rales (Significantly improved) present. No wheezing.  Abdominal:     General: Bowel sounds are normal. There is no distension.     Palpations: Abdomen is soft.     Tenderness: There is no abdominal tenderness.  Musculoskeletal:        General: Normal range of motion.     Cervical back: Normal range of motion and neck supple.     Right lower leg: Edema (trace) present.     Left lower leg: Edema (trace) present.  Skin:    General: Skin is warm and dry.  Neurological:     General: No focal deficit present.     Mental Status: She is alert.  Psychiatric:        Mood and Affect: Mood normal.      Consultants:  Cardiology ID Oncology   Procedures:    Data Reviewed: Results for orders placed or performed during the hospital encounter of 02/12/24 (from the past 24 hours)  Glucose, capillary     Status: Abnormal   Collection Time: 02/16/24  4:32 PM  Result Value Ref Range   Glucose-Capillary 135 (H) 70 - 99 mg/dL  Glucose, capillary     Status: Abnormal   Collection Time: 02/16/24  8:59 PM  Result Value Ref Range   Glucose-Capillary 156 (H) 70 - 99 mg/dL  Basic metabolic panel with GFR     Status: Abnormal   Collection Time: 02/17/24  2:30 AM  Result Value Ref Range   Sodium 136 135 - 145 mmol/L   Potassium 3.6 3.5 - 5.1 mmol/L   Chloride 96 (L) 98 - 111 mmol/L   CO2 31 22 - 32 mmol/L   Glucose, Bld 128 (H) 70 - 99 mg/dL   BUN 8 8 - 23 mg/dL   Creatinine, Ser 2.54 0.44 - 1.00 mg/dL   Calcium  9.4 8.9 - 10.3 mg/dL   GFR, Estimated >27 >06 mL/min   Anion gap 9 5 - 15  CBC with  Differential/Platelet     Status: Abnormal   Collection Time: 02/17/24  2:30 AM  Result Value Ref Range   WBC 5.8 4.0 - 10.5 K/uL   RBC 3.29 (L) 3.87 - 5.11 MIL/uL   Hemoglobin 8.1 (L) 12.0 - 15.0 g/dL   HCT 23.7 (L) 62.8 - 31.5 %   MCV 78.4 (L) 80.0 - 100.0 fL   MCH 24.6 (L) 26.0 - 34.0 pg  MCHC 31.4 30.0 - 36.0 g/dL   RDW 96.2 (H) 95.2 - 84.1 %   Platelets 172 150 - 400 K/uL   nRBC 0.0 0.0 - 0.2 %   Neutrophils Relative % 57 %   Neutro Abs 3.3 1.7 - 7.7 K/uL   Lymphocytes Relative 33 %   Lymphs Abs 1.9 0.7 - 4.0 K/uL   Monocytes Relative 3 %   Monocytes Absolute 0.2 0.1 - 1.0 K/uL   Eosinophils Relative 6 %   Eosinophils Absolute 0.4 0.0 - 0.5 K/uL   Basophils Relative 1 %   Basophils Absolute 0.0 0.0 - 0.1 K/uL   Immature Granulocytes 0 %   Abs Immature Granulocytes 0.01 0.00 - 0.07 K/uL  Magnesium      Status: None   Collection Time: 02/17/24  2:30 AM  Result Value Ref Range   Magnesium  1.7 1.7 - 2.4 mg/dL  Glucose, capillary     Status: Abnormal   Collection Time: 02/17/24  5:58 AM  Result Value Ref Range   Glucose-Capillary 133 (H) 70 - 99 mg/dL  Glucose, capillary     Status: Abnormal   Collection Time: 02/17/24 11:21 AM  Result Value Ref Range   Glucose-Capillary 173 (H) 70 - 99 mg/dL    I have reviewed pertinent nursing notes, vitals, labs, and images as necessary. I have ordered labwork to follow up on as indicated.  I have reviewed the last notes from staff over past 24 hours. I have discussed patient's care plan and test results with nursing staff, CM/SW, and other staff as appropriate.  Time spent: Greater than 50% of the 55 minute visit was spent in counseling/coordination of care for the patient as laid out in the A&P.   LOS: 4 days   Faith Homes, MD [facility] 02/17/2024, 1:11 PM

## 2024-02-17 NOTE — Progress Notes (Signed)
 Mobility Specialist Progress Note;   02/17/24 0930  Mobility  Activity Ambulated with assistance in hallway  Level of Assistance Standby assist, set-up cues, supervision of patient - no hands on  Assistive Device Front wheel walker  Distance Ambulated (ft) 250 ft  Activity Response Tolerated well  Mobility Referral Yes  Mobility visit 1 Mobility  Mobility Specialist Start Time (ACUTE ONLY) 0930  Mobility Specialist Stop Time (ACUTE ONLY) 0944  Mobility Specialist Time Calculation (min) (ACUTE ONLY) 14 min   Pt agreeable to mobility. Required no physical assistance during ambulation, SV. VSS throughout. C/o knee stiffness, otherwise asx. Pt returned back to bed with all needs met, alarm on.    Janit Meline Mobility Specialist Please contact via SecureChat or [employer] [phone]

## 2024-02-17 NOTE — Care Management Important Message (Signed)
 Important Message  Patient Details  Name: Janet Williamson MRN: [id number] Date of Birth: 1941-02-16   Important Message Given:  Yes - Medicare IM     [name] 02/17/2024, 12:31 PM

## 2024-02-17 NOTE — TOC Initial Note (Addendum)
 Transition of Care [facility]) - Initial/Assessment Note    Patient Details  Name: Janet Williamson MRN: [id number] Date of Birth: 1940-12-05  Transition of Care [facility]) CM/SW Contact:    Jeani Mill, RN Phone Number: 02/17/2024, 3:35 PM  Clinical Narrative:                 Spoke to patient and family at bedside regarding transition needs.  Patient lives alone and has family support. Daughters transport to apts.  Patient is agreeable to home health. This RNCM offered choice for Home  Health, Patient states she has no preference, RNCM made referral to Amy with [facility], She is able to take referral.  Pam with [facility] is aware on home iv antibiotics needs and that patient would like daughters to learn how to administer iv antibiotics.  Patient states her family will order the tub bench on their own. Address, Phone number and PCP verified. TOC will continue to follow for needs.   Need home health RN, PT, OT orders  Expected Discharge Plan: Home w Home Health Services Barriers to Discharge: Continued Medical Work up   Patient Goals and CMS Choice Patient states their goals for this hospitalization and ongoing recovery are:: return home CMS Medicare.gov Compare Post Acute Care list provided to:: Patient Choice offered to / list presented to : Patient      Expected Discharge Plan and Services   Discharge Planning Services: CM Consult Post Acute Care Choice: Home Health Living arrangements for the past 2 months: Single Family Home                           HH Arranged: RN, PT, OT [facility] Agency: [facility] Date [facility] Agency Contacted: 02/17/24 Time HH Agency Contacted: [phone] Representative spoke with at [facility] Agency: Amy  Prior Living Arrangements/Services Living arrangements for the past 2 months: Single Family Home Lives with:: Self Patient language and need for interpreter reviewed:: Yes Do you feel safe going back to the place where you live?: Yes      Need for Family  Participation in Patient Care: Yes (Comment) Care giver support system in place?: Yes (comment) Current home services: DME (walker, cane, bsc) Criminal Activity/Legal Involvement Pertinent to Current Situation/Hospitalization: No - Comment as needed  Activities of Daily Living   ADL Screening (condition at time of admission) Independently performs ADLs?: Yes (appropriate for developmental age) Is the patient deaf or have difficulty hearing?: No Does the patient have difficulty seeing, even when wearing glasses/contacts?: No Does the patient have difficulty concentrating, remembering, or making decisions?: No  Permission Sought/Granted Permission sought to share information with : Photographer granted to share info w AGENCY: Home health        Emotional Assessment Appearance:: Appears stated age Attitude/Demeanor/Rapport: Gracious, Engaged Affect (typically observed): Accepting Orientation: : Oriented to Self, Oriented to Place, Oriented to  Time, Oriented to Situation Alcohol / Substance Use: Not Applicable Psych Involvement: No (comment)  Admission diagnosis:  Acute pulmonary edema (HCC) [J81.0] Acute respiratory failure with hypoxia (HCC) [J96.01] Acute hypoxic respiratory failure (HCC) [J96.01] Patient Active Problem List   Diagnosis Date Noted   Acute on chronic heart failure with preserved ejection fraction (HFpEF) (HCC) 02/15/2024   Stenosis of prosthetic aortic valve 02/15/2024   Port-A-Cath in place 01/15/2024   Genetic testing 12/04/2023   Malignant neoplasm of head of pancreas (HCC) 10/24/2023   History  of GI bleed 04/21/2019   Symptomatic anemia 09/30/2018   S/P TAVR (transcatheter aortic valve replacement) 04/15/2018   Severe aortic stenosis    PAF (paroxysmal atrial fibrillation) (HCC) 03/04/2018   History of stroke 11/03/2014   Hypertension    DMII (diabetes mellitus, type 2) (HCC) 05/06/2013   HTN (hypertension)  05/06/2013   Anemia 05/06/2013   PCP:  Benedetto Brady, MD Pharmacy:   [facility] - [place], [place] - [address] [place] [address] Phone: [phone] Fax: [phone]  [facility] - [place], [place] - [address] [place] [address] Phone: [phone] Fax: [phone]     Social Drivers of Health (SDOH) Social History: SDOH Screenings   Food Insecurity: No Food Insecurity (02/14/2024)  Housing: Low Risk  (02/14/2024)  Transportation Needs: No Transportation Needs (02/14/2024)  Utilities: Not At Risk (02/14/2024)  Depression (PHQ2-9): Low Risk  (02/02/2021)  Social Connections: Unknown (02/14/2024)  Tobacco Use: Low Risk  (02/14/2024)   SDOH Interventions:     Readmission Risk Interventions     No data to display

## 2024-02-18 DIAGNOSIS — Z515 Encounter for palliative care: Secondary | ICD-10-CM | POA: Diagnosis not present

## 2024-02-18 DIAGNOSIS — J810 Acute pulmonary edema: Secondary | ICD-10-CM | POA: Diagnosis not present

## 2024-02-18 DIAGNOSIS — I5031 Acute diastolic (congestive) heart failure: Secondary | ICD-10-CM | POA: Diagnosis not present

## 2024-02-18 DIAGNOSIS — I480 Paroxysmal atrial fibrillation: Secondary | ICD-10-CM | POA: Diagnosis not present

## 2024-02-18 DIAGNOSIS — C250 Malignant neoplasm of head of pancreas: Secondary | ICD-10-CM | POA: Diagnosis not present

## 2024-02-18 DIAGNOSIS — J9601 Acute respiratory failure with hypoxia: Secondary | ICD-10-CM | POA: Diagnosis not present

## 2024-02-18 LAB — BASIC METABOLIC PANEL WITH GFR
Anion gap: 9 (ref 5–15)
BUN: 9 mg/dL (ref 8–23)
CO2: 30 mmol/L (ref 22–32)
Calcium: 9.4 mg/dL (ref 8.9–10.3)
Chloride: 98 mmol/L (ref 98–111)
Creatinine, Ser: 0.73 mg/dL (ref 0.44–1.00)
GFR, Estimated: >60 mL/min (ref >60)
Glucose, Bld: 135 mg/dL — ABNORMAL HIGH (ref 70–99)
Potassium: 3.7 mmol/L (ref 3.5–5.1)
Sodium: 137 mmol/L (ref 135–145)

## 2024-02-18 LAB — CBC WITH DIFFERENTIAL/PLATELET
Abs Immature Granulocytes: 0 10*3/uL (ref 0.00–0.07)
Basophils Absolute: 0 10*3/uL (ref 0.0–0.1)
Basophils Relative: 1 %
Eosinophils Absolute: 0.2 10*3/uL (ref 0.0–0.5)
Eosinophils Relative: 5 %
HCT: 28.8 % — ABNORMAL LOW (ref 36.0–46.0)
Hemoglobin: 9 g/dL — ABNORMAL LOW (ref 12.0–15.0)
Immature Granulocytes: 0 %
Lymphocytes Relative: 50 %
Lymphs Abs: 1.8 10*3/uL (ref 0.7–4.0)
MCH: 24.4 pg — ABNORMAL LOW (ref 26.0–34.0)
MCHC: 31.3 g/dL (ref 30.0–36.0)
MCV: 78 fL — ABNORMAL LOW (ref 80.0–100.0)
Monocytes Absolute: 0.3 10*3/uL (ref 0.1–1.0)
Monocytes Relative: 9 %
Neutro Abs: 1.3 10*3/uL — ABNORMAL LOW (ref 1.7–7.7)
Neutrophils Relative %: 35 %
Platelets: 156 10*3/uL (ref 150–400)
RBC: 3.69 MIL/uL — ABNORMAL LOW (ref 3.87–5.11)
RDW: 18.5 % — ABNORMAL HIGH (ref 11.5–15.5)
WBC: 3.6 10*3/uL — ABNORMAL LOW (ref 4.0–10.5)
nRBC: 0 % (ref 0.0–0.2)

## 2024-02-18 LAB — CK: Total CK: 15 U/L — ABNORMAL LOW (ref 38–234)

## 2024-02-18 LAB — CULTURE, BLOOD (ROUTINE X 2)
Culture: NO GROWTH
Culture: NO GROWTH
Culture: NO GROWTH
Special Requests: ADEQUATE

## 2024-02-18 LAB — GLUCOSE, CAPILLARY
Glucose-Capillary: 117 mg/dL — ABNORMAL HIGH (ref 70–99)
Glucose-Capillary: 182 mg/dL — ABNORMAL HIGH (ref 70–99)
Glucose-Capillary: 204 mg/dL — ABNORMAL HIGH (ref 70–99)
Glucose-Capillary: 189 mg/dL — ABNORMAL HIGH (ref 70–99)

## 2024-02-18 LAB — MAGNESIUM: Magnesium: 1.6 mg/dL — ABNORMAL LOW (ref 1.7–2.4)

## 2024-02-18 MED ORDER — CEFTRIAXONE IV (FOR PTA / DISCHARGE USE ONLY): 2.0000 g | INTRAVENOUS | 0 refills | Status: AC

## 2024-02-18 MED ORDER — FUROSEMIDE 20 MG PO TABS: 20.0000 mg | ORAL_TABLET | Freq: Every day | ORAL | 3 refills | Status: DC

## 2024-02-18 MED ORDER — MAGNESIUM SULFATE 2 GM/50ML IV SOLN
2.0000 g | Freq: Once | INTRAVENOUS | Status: AC
  Administered 2024-02-18: 2 g via INTRAVENOUS
  Filled 2024-02-18: qty 50

## 2024-02-18 MED ORDER — DAPTOMYCIN IV (FOR PTA / DISCHARGE USE ONLY)
500.0000 mg | INTRAVENOUS | 0 refills | Status: AC
Start: 2024-02-18 — End: 2024-03-25

## 2024-02-18 MED ORDER — METOPROLOL TARTRATE 25 MG PO TABS: 25.0000 mg | ORAL_TABLET | Freq: Two times a day (BID) | ORAL | 3 refills | Status: AC

## 2024-02-18 NOTE — Progress Notes (Signed)
   Patient Name: VERONE PARI Date of Encounter: 02/18/2024 [facility] Cardiologist: Dorothye Gathers, MD   Interval Summary  .    Afebrile overnight.  No acute events  Vital Signs .    Vitals:   02/17/24 2043 02/17/24 2328 02/18/24 0629 02/18/24 0738  BP: 122/66 (!) 124/48 (!) 121/44 (!) 120/54  Pulse: 79 79 76 76  Resp: 20 18 (!) 22 19  Temp: 98.6 F (37 C) 98.4 F (36.9 C) 98.2 F (36.8 C) 98.2 F (36.8 C)  TempSrc: Oral Oral Oral Oral  SpO2: 94% 98% 95% 97%  Weight:   66 kg   Height:        Intake/Output Summary (Last 24 hours) at 02/18/2024 0921 Last data filed at 02/17/2024 2100 Gross per 24 hour  Intake 630 ml  Output --  Net 630 ml      02/18/2024    6:29 AM 02/17/2024    5:42 AM 02/16/2024    6:00 AM  Last 3 Weights  Weight (lbs) 145 lb 8.1 oz 151 lb 10.8 oz 146 lb 9.7 oz  Weight (kg) 66 kg 68.8 kg 66.5 kg      Telemetry/ECG    SR - Personally Reviewed  Physical Exam .   GEN: No acute distress.   Neck: No JVD Cardiac: RRR, 3/6 systolic murmur over RUSB Respiratory: Diminished breath sounds GI: Soft, nontender, non-distended  MS: No edema  Assessment & Plan .    Tomoko P Nudd is a 83 y.o. female with a hx of pancreatic adenocarcinoma (metastatic?) on chemotherapy, s/p biliary stent, paroxysmal atrial fibrillation, aortic valve dysfunction s/p TAVR 2019, prior stroke, DM, hypertension, GI bleeding who is being seen for the evaluation of elevated troponin in the setting of acute hypoxic respiratory failure at the request of Dr. Gladstone Lamer.     Acute hypoxic respiratory failure -Improved with diuresis.  Continue lasix  20mg  Qday.  Likely due to TAVR structural degeneration.  See below.   Acute HFpEF S/P TAVR 23mm Sapien Endocarditis? -TTE suggests possible endocarditis and valve degeneration.  Given metastatic pancreatic cancer and fraility, not a candidate for invasive procedures.  Discussed with family at bedside.  TAVR structural  degeneration -Continue lasix  20mg  Qday.  Not a candidate for invasive procedures  Paroxysmal atrial fibrillation -Remains in NSR Continue lopressor  25mg  and Xarelto  20mg   Elevated troponin Type II MI HS troponin 32->82->98 in the setting of acute hypoxic respiratory failure. Patient without chest pain. -Continue medical therapy   Hypertension -Continue lopressor ; BP well controlled  Metastatic Pancreatic cancer Per last heme-onc note, has likely metastatic disease to the liver with involvement of SMA and possible peritoneal involvement with limited treated options.  Has opted for comfort care.    Will sign off and arrange outpatient cardiology follow up.  Call with ?s.  For questions or updates, please contact [facility] Please consult [email or web address] for contact info under        Signed, Ailany Koren K Hazen Brumett, MD

## 2024-02-18 NOTE — Plan of Care (Signed)

## 2024-02-18 NOTE — Progress Notes (Signed)
 Physical Therapy Treatment Patient Details Name: Janet Williamson MRN: [id number] DOB: 10/10/1940 Today's Date: 02/18/2024   History of Present Illness 83 y.o. female who was brought in from home 02/13/24 with acute worsening shortness of breath. BiPAP; acute HF; elevated troponin due to demand ischemia; PAF>NSR 5/9;  PMH pancreatic adenocarcinoma possibly metastatic with indeterminant liver lesion currently under investigation (currently on chemotherapy), biliary stent, paroxysmal A-fib on anticoagulation, TAVR, CVA, diabetes, hypertension, GI bleed,    PT Comments  Pt resting in bed on arrival and agreeable to session with continued progress towards acute goals. Pt able to demonstrate gait with rollator support at grossly supervision level for hallway distance with good safety awareness and no LOB. Pt self reporting preference for rollator support in AM due to stiffness, however able to demonstrate gait in room with single UE support for short distance with supervision and no LOB. Pt educated on appropriate activity progression and exercises to complete throughout day with pt verbalizing understanding. Pt continues to benefit from skilled PT services to progress toward functional mobility goals.      If plan is discharge home, recommend the following: Assistance with cooking/housework;Assist for transportation   Can travel by private vehicle        Equipment Recommendations  None recommended by PT (has rollator)    Recommendations for Other Services       Precautions / Restrictions Precautions Precautions: Fall Recall of Precautions/Restrictions: Intact Precaution/Restrictions Comments: watch sats; Target HR <110 Restrictions Weight Bearing Restrictions Per Provider Order: No     Mobility  Bed Mobility Overal bed mobility: Needs Assistance Bed Mobility: Supine to Sit     Supine to sit: HOB elevated, Modified independent (Device/Increase time)          Transfers Overall  transfer level: Needs assistance Equipment used: Rollator (4 wheels) Transfers: Sit to/from Stand Sit to Stand: Supervision           General transfer comment: supervision for safety    Ambulation/Gait Ambulation/Gait assistance: Supervision Gait Distance (Feet): 305 Feet (+15) Assistive device: Rollator (4 wheels), None Gait Pattern/deviations: Step-through pattern, Decreased step length - right, Shuffle, Decreased dorsiflexion - right       General Gait Details: decreased cadence but grossly steady with rollator support, demonstrating safe brake use on sit<>stand, ambulating around bed without UE support, however reaching for footboard for support   Stairs             Wheelchair Mobility     Tilt Bed    Modified Rankin (Stroke Patients Only)       Balance Overall balance assessment: Needs assistance Sitting-balance support: No upper extremity supported, Feet unsupported Sitting balance-Leahy Scale: Good     Standing balance support: During functional activity, No upper extremity supported Standing balance-Leahy Scale: Fair Standing balance comment: able to hold static standing without UE support as pulling up panties                            Communication Communication Communication: No apparent difficulties  Cognition Arousal: Alert Behavior During Therapy: WFL for tasks assessed/performed   PT - Cognitive impairments: No apparent impairments                         Following commands: Intact      Cueing Cueing Techniques: Verbal cues  Exercises Other Exercises Other Exercises: serial sit<>stand x5    General Comments General comments (skin  integrity, edema, etc.): HR 79-112bpm      Pertinent Vitals/Pain Pain Assessment Pain Assessment: No/denies pain Pain Intervention(s): Monitored during session    Home Living                          Prior Function            PT Goals (current goals can now be  found in the care plan section) Acute Rehab PT Goals Patient Stated Goal: walk with cane PT Goal Formulation: With patient Time For Goal Achievement: 02/29/24 Progress towards PT goals: Progressing toward goals    Frequency    Min 2X/week      PT Plan      Co-evaluation              AM-PAC PT "6 Clicks" Mobility   Outcome Measure  Help needed turning from your back to your side while in a flat bed without using bedrails?: None Help needed moving from lying on your back to sitting on the side of a flat bed without using bedrails?: None Help needed moving to and from a bed to a chair (including a wheelchair)?: A Little Help needed standing up from a chair using your arms (e.g., wheelchair or bedside chair)?: A Little Help needed to walk in hospital room?: A Little Help needed climbing 3-5 steps with a railing? : A Little 6 Click Score: 20    End of Session   Activity Tolerance: Patient tolerated treatment well Patient left: in chair;with call bell/phone within reach;with nursing/sitter in room Nurse Communication: Mobility status PT Visit Diagnosis: Other abnormalities of gait and mobility (R26.89);Muscle weakness (generalized) (M62.81)     Time: 9604-5409 PT Time Calculation (min) (ACUTE ONLY): 19 min  Charges:    $Gait Training: 8-22 mins PT General Charges $$ ACUTE PT VISIT: 1 Visit                     Janet Williamson R. PTA Acute Rehabilitation Services Office: [phone]   Janet Williamson 02/18/2024, 9:34 AM

## 2024-02-18 NOTE — Progress Notes (Addendum)
 Janet Williamson   DOB:Feb 09, 1941   JY#:[id number]      ASSESSMENT & PLAN:  [name] is an 83 year old female patient with diagnosis of pancreatic cancer with liver lesion suspicious for mets.  She was admitted on 02/13/2024 due to respiratory distress.  She is followed by medical oncology/Dr. Maryalice Williamson very closely.   Malignant neoplasm head of pancreas with possible liver mets - Diagnosed 10/16/2023. - Status post stenting.  Not a surgical candidate.  PET/CT done shows no definitive evidence of metastatic disease. -Chemotherapy with gemcitabine  alone initiated on 11/13/2023.  Abraxane  was added to the regimen however patient could not tolerate and is now back on gemcitabine  alone.  Therapy is being held at this time in order to complete her course of antibiotics. - Restaging CT scan done 01/29/2024 showed stable pancreatic mass but showed new indeterminate hypodense lesion in the left hepatic lobe new since prior exam, suspicious for mets. - MR liver done 02/10/2024 showed 10 mm lesion of the liver not seen in prior studies.  Findings suspicious for mets - Medical oncology/Dr. Maryalice Williamson following.  Patient will follow-up with outpatient oncology in 3 to 4 weeks.  Acute hypoxic respiratory failure/Pulmonary edema Possible endocarditis - Improving - Seen by cardiology.  Status post TEE which suggests possible endocarditis.  Patient to have antibiotics for several weeks. - Continue to monitor closely   Abdominal pain - Improving - Likely secondary to malignancy - Continue pain management - Monitor closely   Leukocytosis--> leukopenia - WBC initially elevated on admission--> now slightly low 3.6 today - Monitor CBC with differential   Anemia, microcytic - Hemoglobin stable 9.0 today  - Likely multifactorial due to malignancy and recent chemotherapy - No transfusional intervention required at this time - Continue to monitor CBC with differential    Code Status DNR-limited  Subjective:   Patient seen awake alert sitting up in chair at bedside.  Multiple family members also at bedside.  Reports that she is feeling much better and is trying to ambulate more.  Complains of urinary incontinence.  No other acute complaints offered, indeed no shortness of breath is noted and patient is on room air.  Objective:   Intake/Output Summary (Last 24 hours) at 02/18/2024 1204 Last data filed at 02/17/2024 2100 Gross per 24 hour  Intake 390 ml  Output --  Net 390 ml     PHYSICAL EXAMINATION: ECOG PERFORMANCE STATUS: 3 - Symptomatic, >50% confined to bed  Vitals:   02/18/24 0738 02/18/24 1155  BP: (!) 120/54 139/69  Pulse: 76 67  Resp: 19 20  Temp: 98.2 F (36.8 C) 98.2 F (36.8 C)  SpO2: 97% 99%   Filed Weights   02/16/24 0600 02/17/24 0542 02/18/24 0629  Weight: 146 lb 9.7 oz (66.5 kg) 151 lb 10.8 oz (68.8 kg) 145 lb 8.1 oz (66 kg)    GENERAL: alert, no distress and comfortable SKIN: skin color, texture, turgor are normal, no rashes or significant lesions EYES: normal, conjunctiva are pink and non-injected, sclera clear OROPHARYNX: no exudate, no erythema and lips, buccal mucosa, and tongue normal  NECK: supple, thyroid  normal size, non-tender, without nodularity LYMPH: no palpable lymphadenopathy in the cervical, axillary or inguinal LUNGS: clear to auscultation and percussion with normal breathing effort HEART: regular rate & rhythm and no murmurs and no lower extremity edema ABDOMEN: abdomen soft, non-tender and normal bowel sounds MUSCULOSKELETAL: +Ambulates with assist PSYCH: alert & oriented x 3 with fluent speech NEURO: no focal motor/sensory deficits   All  questions were answered. The patient knows to call the clinic with any problems, questions or concerns.   The total time spent in the appointment was 40 minutes encounter with patient including review of chart and various tests results, discussions about plan of care and coordination of care plan  Janet Mate, NP 02/18/2024 12:04 PM    Labs Reviewed:  Lab Results  Component Value Date   WBC 3.6 (L) 02/18/2024   HGB 9.0 (L) 02/18/2024   HCT 28.8 (L) 02/18/2024   MCV 78.0 (L) 02/18/2024   PLT 156 02/18/2024   Recent Labs    02/09/24 1918 02/12/24 1206 02/12/24 2144 02/12/24 2203 02/16/24 0915 02/17/24 0230 02/18/24 0243  NA 135 137 134*   < > 132* 136 137  K 3.8 4.0 4.2   < > 3.4* 3.6 3.7  CL 99 100 102   < > 93* 96* 98  CO2 27 32 22   < > 29 31 30   GLUCOSE 146* 141* 260*   < > 316* 128* 135*  BUN 15 11 9    < > 11 8 9   CREATININE 0.79 0.58 0.78   < > 0.66 0.57 0.73  CALCIUM  9.4 9.4 9.5   < > 9.0 9.4 9.4  GFRNONAA >60 >60 >60   < > >60 >60 >60  PROT 7.7 6.8 8.1  --   --   --   --   ALBUMIN 3.1* 3.3* 3.1*  --   --   --   --   AST 28 15 29   --   --   --   --   ALT 25 13 21   --   --   --   --   ALKPHOS 60 56 73  --   --   --   --   BILITOT 0.5 0.3 0.5  --   --   --   --    < > = values in this interval not displayed.    Studies Reviewed:  MR LIVER W WO CONTRAST Result Date: 02/15/2024 CLINICAL DATA:  Re-staging pancreatic cancer. EXAM: MRI ABDOMEN WITHOUT AND WITH CONTRAST TECHNIQUE: Multiplanar multisequence MR imaging of the abdomen was performed both before and after the administration of intravenous contrast. CONTRAST:  7mL GADAVIST  GADOBUTROL  1 MMOL/ML IV SOLN COMPARISON:  Follow-up prior CT scan 01/29/2024 and PET-CT 11/01/2023. FINDINGS: Lower chest: The lung bases are grossly clear. No pulmonary lesions, pleural or pericardial effusion. Hepatobiliary: Overall stable intrahepatic biliary dilatation and pneumobilia related to a biliary stent. 10 mm lesion in segment 4A of the liver not for certain seen on prior imaging studies. This has slight increased T2 signal intensity and demonstrates delayed rim like contrast enhancement. Findings suspicious for a metastatic focus. No other hepatic lesions are identified. Pancreas: Stable large infiltrating mass in the pancreatic head  measuring approximately 4.3 x 3.4 cm. Associated marked dilatation of the main pancreatic duct which measures a maximum 11 mm. Stable severe atrophy involving the body and tail region of the pancreas. Spleen:  Within normal limits in size and appearance. Adrenals/Urinary Tract: The adrenal glands and kidneys are unremarkable and stable. Stomach/Bowel: The stomach, duodenum, visualized small and visualized colon are unremarkable. Vascular/Lymphatic: The aorta and branch vessels are patent. The major venous structures are patent. No mesenteric or retroperitoneal lymphadenopathy the. Other:  No ascites or abdominal wall hernia. Musculoskeletal: No findings suspicious for osseous metastatic disease. IMPRESSION: 1. Stable large infiltrating mass in the pancreatic head measuring approximately 4.3  x 3.4 cm. Associated marked dilatation of the main pancreatic duct and severe atrophy involving the body and tail region of the pancreas. 2. 10 mm lesion in segment 4A of the liver not for certain seen on prior imaging studies. This has slight increased T2 signal intensity and demonstrates delayed rim like contrast enhancement. Findings suspicious for a metastatic focus. 3. Stable intrahepatic biliary dilatation and pneumobilia related to a biliary stent. Electronically Signed   By: Marrian Siva M.D.   On: 02/15/2024 18:11   DG CHEST PORT 1 VIEW Result Date: 02/15/2024 CLINICAL DATA:  Follow-up pneumonia. EXAM: PORTABLE CHEST 1 VIEW COMPARISON:  02/12/2024 FINDINGS: The cardiac silhouette, mediastinal hilar contours are stable. Stable tortuosity and calcification of the thoracic aorta. Stable right IJ Port-A-Cath. Stable surgical changes from TAVR. The lungs demonstrate much improved aeration with resolving bilateral pulmonary infiltrates. No pleural effusions or pneumothorax. IMPRESSION: Much improved lung aeration with resolving bilateral pulmonary infiltrates. Electronically Signed   By: Marrian Siva M.D.   On: 02/15/2024  17:47   ECHOCARDIOGRAM COMPLETE Result Date: 02/13/2024    ECHOCARDIOGRAM REPORT   Patient Name:   Janet Williamson Date of Exam: 02/13/2024 Medical Rec #:  [id number]         Height:       63.0 in Accession #:    [id number]        Weight:       154.2 lb Date of Birth:  07-Apr-1941          BSA:          1.732 m Patient Age:    83 years          BP:           105/45 mmHg Patient Gender: F                 HR:           72 bpm. Exam Location:  Inpatient Procedure: 2D Echo, 3D Echo, Cardiac Doppler and Color Doppler (Both Spectral            and Color Flow Doppler were utilized during procedure). Indications:    I50.40* Unspecified combined systolic (congestive) and diastolic                 (congestive) heart failure  History:        Patient has prior history of Echocardiogram examinations, most                 recent 03/20/2019. Abnormal ECG, Stroke, Aortic Valve Disease,                 Arrythmias:LBBB, Signs/Symptoms:Edema and Dyspnea; Risk                 Factors:Hypertension and Diabetes. Aortic stenosis. TAVR.                 Metastatic cancer. Chemo.                 Aortic Valve: 23 mm Edwards Sapien prosthetic, stented (TAVR)                 valve is present in the aortic position. Procedure Date:                 04/15/2018.  Sonographer:    Raynelle Callow RDCS Referring Phys: [id number] [facility] IMPRESSIONS  1. Left ventricular ejection fraction, by estimation, is 55 to 60%. The left ventricle has normal function.  The left ventricle has no regional wall motion abnormalities. There is moderate concentric left ventricular hypertrophy. Left ventricular diastolic parameters are consistent with Grade I diastolic dysfunction (impaired relaxation). Elevated left ventricular end-diastolic pressure.  2. Right ventricular systolic function is normal. The right ventricular size is normal. There is mildly elevated pulmonary artery systolic pressure.  3. Left atrial size was mildly dilated.  4. The mitral valve is degenerative.  Moderate to severe mitral valve regurgitation. Mild mitral stenosis. Moderate mitral annular calcification.  5. Tricuspid valve regurgitation is mild to moderate.  6. TAVR valve is thickened and gradients are severely elevated consistent with prosthetic valve stenosis. Mean gradient has increased to 55 mmHg from 30 mmHg 03/2019. The aortic valve has been repaired/replaced. There is severe calcifcation of the aortic  valve. There is severe thickening of the aortic valve. Aortic valve regurgitation is moderate to severe. There is a 23 mm Edwards Sapien prosthetic (TAVR) valve present in the aortic position. Procedure Date: 04/15/2018. Echo findings are consistent with stenosis and regurgitation of the aortic prosthesis. Aortic valve area, by VTI measures 1.07 cm. Aortic valve mean gradient measures 55.0 mmHg. Aortic valve Vmax measures 4.94 m/s.  7. Pulmonic valve regurgitation is moderate.  8. The inferior vena cava is dilated in size with >50% respiratory variability, suggesting right atrial pressure of 8 mmHg. Conclusion(s)/Recommendation(s): Findings concerning for aortic valve vegetation, would recommend a Transesophageal Echocardiogram for clarification. FINDINGS  Left Ventricle: Left ventricular ejection fraction, by estimation, is 55 to 60%. The left ventricle has normal function. The left ventricle has no regional wall motion abnormalities. The left ventricular internal cavity size was normal in size. There is  moderate concentric left ventricular hypertrophy. Left ventricular diastolic parameters are consistent with Grade I diastolic dysfunction (impaired relaxation). Elevated left ventricular end-diastolic pressure. Right Ventricle: The right ventricular size is normal. No increase in right ventricular wall thickness. Right ventricular systolic function is normal. There is mildly elevated pulmonary artery systolic pressure. The tricuspid regurgitant velocity is 2.94  m/s, and with an assumed right atrial  pressure of 8 mmHg, the estimated right ventricular systolic pressure is 42.6 mmHg. Left Atrium: Left atrial size was mildly dilated. Right Atrium: Right atrial size was normal in size. Pericardium: There is no evidence of pericardial effusion. Mitral Valve: The mitral valve is degenerative in appearance. There is moderate thickening of the mitral valve leaflet(s). There is mild calcification of the mitral valve leaflet(s). Moderate mitral annular calcification. Moderate to severe mitral valve regurgitation. Mild mitral valve stenosis. MV peak gradient, 10.4 mmHg. The mean mitral valve gradient is 4.0 mmHg. Tricuspid Valve: The tricuspid valve is normal in structure. Tricuspid valve regurgitation is mild to moderate. No evidence of tricuspid stenosis. Aortic Valve: TAVR valve is thickened and gradients are severely elevated consistent with prosthetic valve stenosis. Mean gradient has increased to 55 mmHg from 30 mmHg 03/2019. The aortic valve has been repaired/replaced. There is severe calcifcation of the aortic valve. There is severe thickening of the aortic valve. Aortic valve regurgitation is moderate to severe. Aortic regurgitation PHT measures 409 msec. Aortic valve mean gradient measures 55.0 mmHg. Aortic valve peak gradient measures 97.6 mmHg. Aortic valve area, by VTI measures 1.07 cm. There is a 23 mm Edwards Sapien prosthetic, stented (TAVR) valve present in the aortic position. Procedure Date: 04/15/2018. Pulmonic Valve: The pulmonic valve was grossly normal. Pulmonic valve regurgitation is moderate. No evidence of pulmonic stenosis. Aorta: The aortic root and ascending aorta are structurally normal, with no evidence  of dilitation. Venous: The inferior vena cava is dilated in size with greater than 50% respiratory variability, suggesting right atrial pressure of 8 mmHg. IAS/Shunts: No atrial level shunt detected by color flow Doppler. Additional Comments: 3D was performed not requiring image post processing  on an independent workstation and was abnormal.  LEFT VENTRICLE PLAX 2D LVIDd:         3.80 cm     Diastology LVIDs:         2.60 cm     LV e' medial:    4.24 cm/s LV PW:         1.30 cm     LV E/e' medial:  30.1 LV IVS:        1.40 cm     LV e' lateral:   5.11 cm/s LVOT diam:     2.30 cm     LV E/e' lateral: 25.0 LV SV:         116 LV SV Index:   67 LVOT Area:     4.15 cm  LV Volumes (MOD) LV vol d, MOD A2C: 87.2 ml LV vol d, MOD A4C: 86.3 ml LV vol s, MOD A2C: 44.6 ml LV vol s, MOD A4C: 38.9 ml LV SV MOD A2C:     42.6 ml LV SV MOD A4C:     86.3 ml LV SV MOD BP:      46.3 ml RIGHT VENTRICLE             IVC RV S prime:     10.90 cm/s  IVC diam: 2.20 cm TAPSE (M-mode): 2.6 cm LEFT ATRIUM             Index        RIGHT ATRIUM           Index LA diam:        4.30 cm 2.48 cm/m   RA Area:     12.60 cm LA Vol (A2C):   53.3 ml 30.78 ml/m  RA Volume:   27.80 ml  16.06 ml/m LA Vol (A4C):   56.0 ml 32.34 ml/m LA Biplane Vol: 54.7 ml 31.59 ml/m  AORTIC VALVE                     PULMONIC VALVE AV Area (Vmax):    1.03 cm      PR End Diast Vel: 1.67 msec AV Area (Vmean):   1.06 cm AV Area (VTI):     1.07 cm AV Vmax:           494.00 cm/s AV Vmean:          336.500 cm/s AV VTI:            1.080 m AV Peak Grad:      97.6 mmHg AV Mean Grad:      55.0 mmHg LVOT Vmax:         123.00 cm/s LVOT Vmean:        86.200 cm/s LVOT VTI:          0.278 m LVOT/AV VTI ratio: 0.26 AI PHT:            409 msec  AORTA Ao Root diam: 2.80 cm Ao Asc diam:  3.70 cm MITRAL VALVE                  TRICUSPID VALVE MV Area (PHT): 3.10 cm       TR Peak grad:   34.6 mmHg MV Area  VTI:   2.62 cm       TR Vmax:        294.00 cm/s MV Peak grad:  10.4 mmHg MV Mean grad:  4.0 mmHg       SHUNTS MV Vmax:       1.61 m/s       Systemic VTI:  0.28 m MV Vmean:      99.8 cm/s      Systemic Diam: 2.30 cm MV Decel Time: 245 msec MR Peak grad:    180.1 mmHg MR Mean grad:    77.0 mmHg MR Vmax:         671.00 cm/s MR Vmean:        372.0 cm/s MR PISA:         3.08 cm  MR PISA Eff ROA: 18 mm MR PISA Radius:  0.70 cm MV E velocity: 127.50 cm/s MV A velocity: 137.50 cm/s MV E/A ratio:  0.93 Maudine Sos MD Electronically signed by Maudine Sos MD Signature Date/Time: 02/13/2024/12:36:11 PM    Final    DG Chest Portable 1 View Result Date: 02/12/2024 CLINICAL DATA:  Shortness of breath EXAM: PORTABLE CHEST 1 VIEW COMPARISON:  02/09/2024 FINDINGS: Check shadow is stable. Changes of prior TAVR are noted. Right chest wall port is again seen. Aortic calcifications are noted. The lungs are well aerated bilaterally. Diffuse airspace opacity is noted right greater than left consistent with diffuse edema given the abrupt onset. No sizable effusion is seen. No bony abnormality is noted. IMPRESSION: Diffuse airspace opacities bilaterally likely related to pulmonary edema. Electronically Signed   By: Violeta Grey M.D.   On: 02/12/2024 22:17   DG Chest Port 1 View Result Date: 02/09/2024 CLINICAL DATA:  Shortness of breath EXAM: PORTABLE CHEST 1 VIEW COMPARISON:  05/17/2019 FINDINGS: Changes of prior TAVR are again seen. Aortic calcifications are noted. New right chest wall port is seen in satisfactory position. Lungs are well aerated bilaterally. No focal infiltrate or sizable effusion is seen. No bony abnormality is noted. IMPRESSION: No active disease. Electronically Signed   By: Violeta Grey M.D.   On: 02/09/2024 19:36   CT PANCREAS ABDOMEN W WO CONTRAST Result Date: 02/04/2024 CLINICAL DATA:  Pancreatic cancer, assess treatment response. * Tracking Code: BO * EXAM: CT ABDOMEN WITHOUT AND WITH CONTRAST TECHNIQUE: Multidetector CT imaging of the abdomen was performed following the standard protocol before and following the bolus administration of intravenous contrast. RADIATION DOSE REDUCTION: This exam was performed according to the departmental dose-optimization program which includes automated exposure control, adjustment of the mA and/or kV according to patient size and/or  use of iterative reconstruction technique. CONTRAST:  OMNIPAQUE  IOHEXOL  350 MG/ML SOLN COMPARISON:  10/08/2023 FINDINGS: Lower chest: 10 mm nodule within the right lower lobe, axial image # 4/6, is stable since remote prior examination of 04/09/2018 safely considered benign. A 4 mm adjacent pulmonary nodule within the right lower lobe was present on immediate prior examination, but was not seen remote prior examination and is technically indeterminate. This is unchanged, however. Cardiac size within normal limits. Hypoattenuation of the cardiac blood pool in keeping with at least mild anemia. Hepatobiliary: Interval palliative stenting of the extrahepatic bile duct. Pneumobilia within the intra and extrahepatic biliary tree in keeping with patency of the stented segment. 11 mm indeterminate hypodense lesion is seen left hepatic lobe, axial image # 14/9 new since prior examination and demonstrating some degree of marginal irregular enhancement, suspicious for intrahepatic metastasis. Portal vein is  patent. Gallbladder is unremarkable. Pancreas: The lobulated hypoenhancing mass within the head of the pancreas extending into the uncinate process is grossly stable measuring 3.8 x 4.7 x 3.2 cm in greatest dimension. The mass encases the superior mesenteric artery at axial image # 46/9 encases and severely narrows the superior mesenteric vein at axial image # 42/9 obstructs the central pancreatic duct which is stably, markedly dilated throughout the more distal pancreas, and abuts the stented portion the extrahepatic. The mass extends into the small bowel mesentery inferiorly, similar to prior examination with occlusion of several mesenteric venous tributaries. The body and tail the pancreas demonstrates progressive parenchymal atrophy. No superimposed peripancreatic inflammatory changes are identified. The mass abuts but does not clearly a adjacent second and third portion of the duodenum. Spleen: Unremarkable  Adrenals/Urinary Tract: Adrenal glands are unremarkable. Kidneys are normal, without renal calculi, focal lesion, or hydronephrosis. Stomach/Bowel: There is mild asymmetric wall thickening the visualized ascending and proximal transverse colon which may relate to impaired venous drainage related to occluded mesenteric venous collateral centrally. Visualized large and small bowel are otherwise. No free intraperitoneal gas or fluid. Vascular/Lymphatic: As noted above, the superior mesenteric vein is markedly narrowed and the superior mesenteric artery are encased by the malignant mass. Additionally, numerous mesenteric veins are obliterated by the mass and there is collateralization to the gastroepiploic vein and inferior mesenteric vein which ultimately drain to the splenic vein. Other: No abdominal wall hernia Musculoskeletal: No acute bone abnormality. No lytic or blastic bone lesion. Osseous structures are age appropriate. IMPRESSION: 1. Grossly stable size and appearance of a 4.7 cm malignant mass within the head of the pancreas extending into the uncinate process. The mass encases the superior mesenteric artery and encases and severely narrows the superior mesenteric vein. The mass also extends into the small bowel mesentery inferiorly with occlusion of several mesenteric venous tributaries. 2. Interval palliative stenting of the extrahepatic bile duct with expected pneumobilia. 3. 11 mm indeterminate hypodense lesion within the left hepatic lobe, new since prior examination, suspicious for a intrahepatic metastasis. This could be confirmed with dedicated contrast enhanced MRI imaging or re-evaluated on subsequent examination. 4. 4 mm right lower lobe pulmonary nodule, stable since immediate prior examination, but not seen on remote prior examination. Close attention on follow-up imaging is warranted. 5. Hypoattenuation of the cardiac blood pool in keeping with at least mild anemia. 6. Mild asymmetric wall  thickening of the ascending and proximal transverse colon which may relate to impaired venous drainage related to occluded mesenteric venous tributaries. Electronically Signed   By: Worthy Heads M.D.   On: 02/04/2024 19:51   ADDENDUM I have seen the patient, examined her. I agree with the assessment and and plan and have edited the notes.   Pt is clinically stable, will likely go home with iv antibiotics for acute endocarditis, she will be on antibiotics for 6 weeks.  I reviewed her liver MRI findings, which is suspicious for multiple liver metastasis from pancreatic cancer.  Due to her renal infection, I will hold her chemo when she is on antibiotics.  Patient remains to be hopeful that that she may have more cancer treatment.  I plan to see her back in 3-4 weeks in office, and reevaluate her candidacy for chemo, and plan to draw Guardant 360 to see if she is a candidate for targeted therapy.  All questions were answered.  Sonja [place] MD 02/18/2024

## 2024-02-18 NOTE — Progress Notes (Signed)
 Progress Note    Janet Williamson   ZOX:[id number]  DOB: [birth year]  DOA: 02/12/2024     5 PCP: Benedetto Brady, MD  Initial CC: SOB  Hospital Course: Janet Williamson is an 83 year old female with pancreatic adenocarcinoma on gemcitabine  with questionable mets currently being followed by oncology.  Also PMH of TAVR, PAF, CVA, diabetes, HTN, GI bleed who presented with dyspnea and two-pillow orthopnea.  She also endorsed some swelling in her legs after standing but states that they improve after laying in the bed flat. Over the weekend had developed some cough but denied any sputum production.  Her symptoms of shortness of breath and leg swelling had been going on for approximately 1 month. She did endorse some chest pain over the weekend as well.  She just received day 8 cycle 4 of gemcitabine  on 02/12/2024 (day 1 was on 4/30).    Notable labs included: Procalcitonin 3.13 WBC 12 on admission Troponin steadily uptrending (32 >>82>>98) BNP 815   CXR showed concern for diffuse airspace opacities possibly pulmonary edema.   Given her cough, leukocytosis, and procalcitonin, she was also started on antibiotics for pneumonia coverage.   Echo was also obtained which showed concern for moderate to severe MR.  Thickened TAVR valve with prosthetic valve stenosis; severe thickening of aortic valve.  Echo report states findings concerning for potential aortic valve vegetation and recommending to consider TEE.   Only 1 set of blood cultures was obtained on admission.  Requesting to see if additional set can be obtained on 02/13/2024 but has already had antibiotics.   Cardiology was consulted on admission as well due to elevated troponin, BNP.  Echo findings resulted after consultation request.  Clinically improved with diuresis and weaned off oxygen.  Echo showed at aortic valve severely affected and patient not felt to be treatment candidate for any intervention.  Palliative care was also consulted.  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  Interval History:  Patient has met with cardiology, palliative care, oncology. Planning for outpt tx of culture neg endocarditis at this time.   Assessment and Plan: * Acute hypoxic respiratory failure (HCC)-resolved as of 02/16/2024 - Not on oxygen at baseline - Possibly mixed etiology from underlying pulmonary edema and/or infiltrates - Successfully weaned to room air  Elevated troponin-resolved as of 02/14/2024 - Suspecting demand ischemia in setting of RVR but also concerned for underlying ischemic etiology with uptrending troponin -Cardiology consulted to help further evaluate; agreed with demand ischemia -Troponin peaked at 98 - no further workup at this time per cardiology  Sepsis (HCC)-resolved as of 02/16/2024 - Tachycardia, tachypnea, leukocytosis.  Suspected pulmonary source on admission - Possibility of endocarditis given echo findings of aortic valve, see below - Continue Rocephin  and azithromycin . Tolerating well -Follow-up blood cultures.  Only 1 set drawn on admission prior to antibiotics - All cultures remaining negative so far  Pulmonary edema-resolved as of 02/16/2024 - Concern for etiology from moderate to severe mitral valve regurg and diseased AV - Appreciate cardiology evaluation - Continue diuresis as pressure can tolerate - CXR repeated on 02/15/2024 showing significant improvement  S/P TAVR (transcatheter aortic valve replacement) - echo noting "TAVR valve is thickened and gradients are severely elevated consistent  with prosthetic valve stenosis" and "The aortic valve has been repaired/replaced. There is  severe calcifcation of the aortic  valve. There is severe thickening of the aortic valve. Aortic valve  regurgitation is moderate to severe" - potential concern for vegetation of AV; cardiology has evaluated and due to not a  valve replacement candidate in setting of pancreatic cancer, TEE was not recommended - appreciate ID evaluation; recommending to continue on vanc and rocephin  for now and see if cardiology will consider TEE to further evaluate valve (sounds like no?); clarifying plan further today but given complexity she's been recommending for at least palliative consult - discussed with ID and oncology; hope is to treat the treatable  - ID planning to treat as culture-negative endocarditis.  End date 03/25/2024 (daptomycin and Rocephin  through her port)  PAF (paroxysmal atrial fibrillation) (HCC) - On Xarelto  at home and lopressor  as well - Developed RVR with soft BP; appreciate cardiology evaluation; was started on Cardizem  drip on 02/13/2024 -Has converted back to NSR.  Now continued on Lopressor  and Xarelto .  Potential to consider amiodarone if recurrent RVR and soft blood pressures going forward  Malignant neoplasm of head of pancreas [facility]) - follows with oncology, Dr. [name] - diagnosed 10/16/23 with EUS confirming adenocarcinoma  - Did not tolerate gemcitabine /Abraxane ; now on only gemcitabine .  Completed day 8 cycle 4 on 02/12/2024 - oncology aware of admission  - Follow-up MRI liver findings with oncology (concern for met) - outpt follow up with oncology  History of GI bleed - Continue Protonix   History of stroke - on xarelto  and zocor   HTN (hypertension) - adjusting regimen for now  DMII (diabetes mellitus, type 2) (HCC) - A1c 7.1% on 01/13/24 - Continue SSI and CBG monitoring   Old records reviewed in assessment of this patient  Antimicrobials: Levaquin  02/13/2024 x 1 Rocephin  02/13/2024 >> current Azithromycin  02/14/2024 >> 02/15/2024 Vancomycin  02/14/2024 >> 5/11 Daptomycin 5/12 >> current   DVT prophylaxis:   rivaroxaban  (XARELTO ) tablet 20 mg   Code Status:   Code Status: Limited: Do not attempt resuscitation (DNR) -DNR-LIMITED -Do Not Intubate/DNI   Mobility Assessment (Last 72 Hours)      Mobility Assessment     Row Name 02/18/24 0900 02/18/24 0730 02/17/24 2100 02/17/24 0738 02/16/24 2000   Does patient have an order for bedrest or is patient medically unstable -- No - Continue assessment No - Continue assessment No - Continue assessment No - Continue assessment   What is the highest level of mobility based on the progressive mobility assessment? Level 4 (Walks with assist in room) - Balance while marching in place and cannot step forward and back - Complete Level 4 (Walks with assist in room) - Balance while marching in place and cannot step forward and back - Complete Level 5 (Walks with assist in room/hall) - Balance while stepping forward/back and can walk in room with assist - Complete Level 2 (Chairfast) - Balance while sitting on edge of bed and cannot stand Level 5 (Walks with assist in room/hall) - Balance while stepping forward/back and can walk in room with assist - Complete   Is the above level different from baseline mobility prior to current illness? -- Yes - Recommend PT order Yes - Recommend PT order Yes - Recommend PT order Yes - Recommend PT order    Row Name 02/16/24 0825 02/16/24 0800 02/15/24 2000 02/15/24 1831     Does patient have an order for bedrest or is patient medically unstable -- No - Continue assessment No - Continue assessment --    What is the highest level of mobility based on the progressive mobility assessment? Level 5 (Walks with assist in room/hall) -  Balance while stepping forward/back and can walk in room with assist - Complete Level 5 (Walks with assist in room/hall) - Balance while stepping forward/back and can walk in room with assist - Complete Level 5 (Walks with assist in room/hall) - Balance while stepping forward/back and can walk in room with assist - Complete Level 5 (Walks with assist in room/hall) - Balance while stepping forward/back and can walk in room with assist - Complete    Is the above level different from baseline mobility  prior to current illness? -- Yes - Recommend PT order Yes - Recommend PT order --             Barriers to discharge: none Disposition Plan:  Home HH orders placed: n/a Status is: Inpt  Objective: Blood pressure 139/69, pulse 67, temperature 98.2 F (36.8 C), temperature source Oral, resp. rate 20, height 5\' 3"  (1.6 m), weight 66 kg, SpO2 99%.  Examination:  Physical Exam Constitutional:      Appearance: Normal appearance.  HENT:     Head: Normocephalic and atraumatic.     Mouth/Throat:     Mouth: Mucous membranes are moist.  Eyes:     Extraocular Movements: Extraocular movements intact.  Cardiovascular:     Rate and Rhythm: Normal rate.     Heart sounds: Murmur heard.  Pulmonary:     Effort: Pulmonary effort is normal. No respiratory distress.     Breath sounds: Rhonchi (Significantly improved) and rales (Significantly improved) present. No wheezing.  Abdominal:     General: Bowel sounds are normal. There is no distension.     Palpations: Abdomen is soft.     Tenderness: There is no abdominal tenderness.  Musculoskeletal:        General: Normal range of motion.     Cervical back: Normal range of motion and neck supple.     Right lower leg: Edema (trace) present.     Left lower leg: Edema (trace) present.  Skin:    General: Skin is warm and dry.  Neurological:     General: No focal deficit present.     Mental Status: She is alert.  Psychiatric:        Mood and Affect: Mood normal.      Consultants:  Cardiology ID Oncology   Procedures:    Data Reviewed: Results for orders placed or performed during the hospital encounter of 02/12/24 (from the past 24 hours)  Glucose, capillary     Status: Abnormal   Collection Time: 02/17/24  4:56 PM  Result Value Ref Range   Glucose-Capillary 100 (H) 70 - 99 mg/dL  Glucose, capillary     Status: Abnormal   Collection Time: 02/17/24  8:44 PM  Result Value Ref Range   Glucose-Capillary 190 (H) 70 - 99 mg/dL  Basic  metabolic panel with GFR     Status: Abnormal   Collection Time: 02/18/24  2:43 AM  Result Value Ref Range   Sodium 137 135 - 145 mmol/L   Potassium 3.7 3.5 - 5.1 mmol/L   Chloride 98 98 - 111 mmol/L   CO2 30 22 - 32 mmol/L   Glucose, Bld 135 (H) 70 - 99 mg/dL   BUN 9 8 - 23 mg/dL   Creatinine, Ser 1.61 0.44 - 1.00 mg/dL   Calcium  9.4 8.9 - 10.3 mg/dL   GFR, Estimated >09 >60 mL/min   Anion gap 9 5 - 15  CBC with Differential/Platelet     Status: Abnormal   Collection  Time: 02/18/24  2:43 AM  Result Value Ref Range   WBC 3.6 (L) 4.0 - 10.5 K/uL   RBC 3.69 (L) 3.87 - 5.11 MIL/uL   Hemoglobin 9.0 (L) 12.0 - 15.0 g/dL   HCT 09.8 (L) 11.9 - 14.7 %   MCV 78.0 (L) 80.0 - 100.0 fL   MCH 24.4 (L) 26.0 - 34.0 pg   MCHC 31.3 30.0 - 36.0 g/dL   RDW 82.9 (H) 56.2 - 13.0 %   Platelets 156 150 - 400 K/uL   nRBC 0.0 0.0 - 0.2 %   Neutrophils Relative % 35 %   Neutro Abs 1.3 (L) 1.7 - 7.7 K/uL   Lymphocytes Relative 50 %   Lymphs Abs 1.8 0.7 - 4.0 K/uL   Monocytes Relative 9 %   Monocytes Absolute 0.3 0.1 - 1.0 K/uL   Eosinophils Relative 5 %   Eosinophils Absolute 0.2 0.0 - 0.5 K/uL   Basophils Relative 1 %   Basophils Absolute 0.0 0.0 - 0.1 K/uL   Immature Granulocytes 0 %   Abs Immature Granulocytes 0.00 0.00 - 0.07 K/uL  Magnesium      Status: Abnormal   Collection Time: 02/18/24  2:43 AM  Result Value Ref Range   Magnesium  1.6 (L) 1.7 - 2.4 mg/dL  CK     Status: Abnormal   Collection Time: 02/18/24  2:43 AM  Result Value Ref Range   Total CK 15 (L) 38 - 234 U/L  Glucose, capillary     Status: Abnormal   Collection Time: 02/18/24  6:26 AM  Result Value Ref Range   Glucose-Capillary 117 (H) 70 - 99 mg/dL  Glucose, capillary     Status: Abnormal   Collection Time: 02/18/24 11:18 AM  Result Value Ref Range   Glucose-Capillary 182 (H) 70 - 99 mg/dL    I have reviewed pertinent nursing notes, vitals, labs, and images as necessary. I have ordered labwork to follow up on as  indicated.  I have reviewed the last notes from staff over past 24 hours. I have discussed patient's care plan and test results with nursing staff, CM/SW, and other staff as appropriate.  Time spent: Greater than 50% of the 55 minute visit was spent in counseling/coordination of care for the patient as laid out in the A&P.   LOS: 5 days   Faith Homes, MD [facility] 02/18/2024, 3:11 PM

## 2024-02-18 NOTE — Progress Notes (Signed)
 Patient ID: Janet Williamson, female   DOB: [birth year], 83 y.o.   MRN: [id number]    Progress Note from the Palliative Medicine Team at [facility]   Patient Name: Janet Williamson        Date: 02/18/2024 DOB: 31-Oct-1940  Age: 83 y.o. MRN#: [id number] Attending Physician: Faith Homes, MD Primary Care Physician: Benedetto Brady, MD Admit Date: 02/12/2024   Reason for Consultation/Follow-up   Establishing Goals of Care   HPI/ Brief Hospital Review  83 year old female patient with diagnosis of PAD creat at cancer with liver lesions suspicious for metastasis.  Admitted on 02/13/2024 secondary to respiratory distress.  Admitted for treatment and stabilization.  Respiratory distress secondary to underlying pulmonary edema.  Sepsis worked up with possible endocarditis.  Infectious disease consulted.  Cardiology consulted  Past medical history significant for TAVR, PAF, CVA, diabetes, hypertension and GI bleed.  Patient faces ongoing treatment option decisions, advanced directive decisions and anticipatory care needs.   Subjective  Extensive chart review has been completed prior to meeting with patient/family  including labs, vital signs, imaging, progress/consult notes, orders, medications and available advance directive documents.    This NP assessed patient at the bedside as a follow up to  yesterday's GOCs meeting.  Patient's long life friend and church member at bedside.  With patient's permission continued conversation regarding current medical situation.   Patient understands the seriousness of her medical condition specific to her underlying oncologic diagnosis.  She is open to offered physical therapy whether it be at home or in a SNF for short-term rehab depending on eligibility.  As discussed yesterday patient remains open to all offered and available medical interventions to prolong quality life.    She is hopeful for continued stabilization and even improvement.  She looks  forward to a planned church outing in the fall.  Education offered today regarding  the importance of continued conversation with family and their  medical providers regarding overall plan of care and treatment options,  ensuring decisions are within the context of the patients values and GOCs.  Again I shared that we do not have any advance care planning documents or H POA on file in her electronic medical records.  Encouraged her to bring any documents in for scanning.  Questions and concerns addressed    Discussed with Dr.Girgus via secure chat   Time:  50 minutes  Detailed review of medical records ( labs, imaging, vital signs), medically appropriate exam ( MS, skin, cardiac,  resp)   discussed with treatment team, counseling and education to patient, family, staff, documenting clinical information, medication management, coordination of care    Thena Fireman NP  Palliative Medicine Team Team Phone # [phone] Pager [phone]

## 2024-02-18 NOTE — Progress Notes (Signed)
 Mobility Specialist Progress Note;   02/18/24 0923  Mobility  Activity Ambulated with assistance to bathroom  Level of Assistance Contact guard assist, steadying assist  Assistive Device None  Distance Ambulated (ft) 10 ft  Activity Response Tolerated well  Mobility Referral Yes  Mobility visit 1 Mobility  Mobility Specialist Start Time (ACUTE ONLY) [id number]  Mobility Specialist Stop Time (ACUTE ONLY) 0930  Mobility Specialist Time Calculation (min) (ACUTE ONLY) 7 min   Answered pts call light requesting assistance to BR. Required MinG assistance to ambulate in the room to BR safely. Instructed pt to pull call bell once finished. Pt left in BR with all needs met. NT notified.   Janit Meline Mobility Specialist Please contact via SecureChat or [employer] [phone]

## 2024-02-18 NOTE — Progress Notes (Signed)
 [facility]  Date of Admission:  02/12/2024      Total days of antibiotics 6          ASSESSMENT: Janet Williamson is a 83 y.o. female admitted with:   Acute Respiratory Failure -  Abnormal TTE -  Concerning for TAVR Endocarditis with New AV Stenosis / Insufficiency TAVR  Cardiology has seen - endocarditis vs leaflet thrombosis causing heart failure symptoms. Not a candidate for invasive procedures, including TEE based on notes. No positive blood cultures.  It is not terribly clear she has infection contributing to the problem. Resumed on anticoagulation (Xarelto  20 mg) given AFib and possible acute thrombosis of valve.  She had several of her daughters in the room today. Would like to go forward with antibiotics in light of uncertainty. Broad coverage with daptomycin + ceftriaxone .  -Continue daptomycin + ceftriaxone  for now for safer antibiotic profile x 6 weeks -OPAT outlined below.    Pancreatic Adenocarcinoma, On Chemotherapy s/p biliary stent -  Goals of Care -  D/W Dr. Gladstone Lamer who discussed with Dr. Baldwin Bones with oncology with understanding to "treat what is treatable". Verbalized to cardiology that she wanted full comfort measures  -Palliative medicine team has been consulted, notes reviewed and she would like to pursue all efforts including oncology treatments in hopes to continue good QOL.   Vascular Access -  -PORT in place, no need for picc  -Home health orders to maintain port line care and education for patient described below   Discharge Planning / Coordination of Care -  -Outpatient antibiotics set -Discussed with Kay Parson, ID pharmacy and primary   Medication Monitoring -  -Safety labs ordered and detailed below to be followed in OPAT clinic  ID will sign off - please call back with any questions/concerns or if we can be of further assistance.    PLAN: OPAT outlined below  She would like to pursue all medical  treatments including oncology treatments.  Port in place --> does not need PICC    OPAT ORDERS:  Diagnosis: Possible TAVR Endocarditis   Culture Result: negative  Allergies  Allergen Reactions   Ace Inhibitors Swelling and Other (See Comments)    Angioedema    Keflex [Cephalexin] Swelling and Other (See Comments)    Lips became swollen   Rifadin [Rifampin] Swelling and Other (See Comments)    Lips became swollen     Discharge antibiotics to be given via PORT-A-CATH line:  Daptomycin 500 mg IV q24h  + Ceftriaxone  2 gm IV q24h    Duration: 6 weeks   End Date: 03/25/2024  [facility] Care Per Protocol with Biopatch Use (if recommended) Home health RN for IV administration and teaching, line care and labs.    Labs weekly while on IV antibiotics: _x_ CBC with differential __ BMP **TWICE WEEKLY ON VANCOMYCIN   _x_ CMP _x_ CRP __ ESR __ Vancomycin  trough TWICE WEEKLY _x_ CK  __ Please pull PIC at completion of IV antibiotics __ Please leave PIC in place until doctor has seen patient or been notified _x_ Please de-access PORT-A-CATH PRN and at conclusion of antibiotic therapy  Fax weekly labs to [phone]  Clinic Follow Up Appt: MYCHART VIRTUAL VISIT 03/10/24 @ 1:30 PM with [name], NP  In Office Follow Up Visit 03/25/24 @ 9:30 AM with Dr. [name]     Active Problems:   DMII (diabetes mellitus, type 2) (HCC)   HTN (hypertension)  History of stroke   PAF (paroxysmal atrial fibrillation) (HCC)   S/P TAVR (transcatheter aortic valve replacement)   History of GI bleed   Malignant neoplasm of head of pancreas (HCC)   Acute on chronic heart failure with preserved ejection fraction (HFpEF) (HCC)   Stenosis of prosthetic aortic valve    Chlorhexidine  Gluconate Cloth  6 each Topical Daily   furosemide   20 mg Oral Daily   insulin  aspart  0-5 Units Subcutaneous QHS   insulin  aspart  0-6 Units Subcutaneous TID WC   lipase/protease/amylase  72,000 Units  Oral TID WC   metoprolol  tartrate  25 mg Oral BID   pantoprazole   20 mg Oral Daily   rivaroxaban   20 mg Oral q morning   simvastatin   20 mg Oral QHS   sodium chloride  flush  3 mL Intravenous Q12H    SUBJECTIVE: She is doing OK today. Feeling alright overall with acceptable appetite.    Review of Systems: Review of Systems  Constitutional:  Negative for chills, fever, malaise/fatigue and weight loss.  Respiratory:  Negative for shortness of breath.   Cardiovascular: Negative.   Genitourinary: Negative.   Musculoskeletal: Negative.     Allergies  Allergen Reactions   Ace Inhibitors Swelling and Other (See Comments)    Angioedema    Keflex [Cephalexin] Swelling and Other (See Comments)    Lips became swollen   Rifadin [Rifampin] Swelling and Other (See Comments)    Lips became swollen    OBJECTIVE: Vitals:   02/17/24 2043 02/17/24 2328 02/18/24 0629 02/18/24 0738  BP: 122/66 (!) 124/48 (!) 121/44 (!) 120/54  Pulse: 79 79 76 76  Resp: 20 18 (!) 22 19  Temp: 98.6 F (37 C) 98.4 F (36.9 C) 98.2 F (36.8 C) 98.2 F (36.8 C)  TempSrc: Oral Oral Oral Oral  SpO2: 94% 98% 95% 97%  Weight:   66 kg   Height:       Body mass index is 25.77 kg/m.  Physical Exam Constitutional:      General: She is not in acute distress.    Appearance: Normal appearance.  Eyes:     Pupils: Pupils are equal, round, and reactive to light.  Cardiovascular:     Rate and Rhythm: Normal rate and regular rhythm.     Heart sounds: Murmur heard.  Pulmonary:     Effort: Pulmonary effort is normal.     Breath sounds: Normal breath sounds.  Chest:     Comments: Port site unremarkable and well healed  Abdominal:     General: Bowel sounds are normal.     Palpations: Abdomen is soft.  Musculoskeletal:        General: Normal range of motion.  Skin:    General: Skin is warm and dry.  Neurological:     Mental Status: She is alert and oriented to person, place, and time.     Lab  Results Lab Results  Component Value Date   WBC 3.6 (L) 02/18/2024   HGB 9.0 (L) 02/18/2024   HCT 28.8 (L) 02/18/2024   MCV 78.0 (L) 02/18/2024   PLT 156 02/18/2024    Lab Results  Component Value Date   CREATININE 0.73 02/18/2024   BUN 9 02/18/2024   NA 137 02/18/2024   K 3.7 02/18/2024   CL 98 02/18/2024   CO2 30 02/18/2024    Lab Results  Component Value Date   ALT 21 02/12/2024   AST 29 02/12/2024   ALKPHOS 73 02/12/2024  BILITOT 0.5 02/12/2024     Microbiology: Recent Results (from the past 240 hours)  Culture, blood (routine x 2)     Status: None   Collection Time: 02/12/24 11:36 PM   Specimen: BLOOD RIGHT HAND  Result Value Ref Range Status   Specimen Description BLOOD RIGHT HAND  Final   Special Requests   Final    BOTTLES DRAWN AEROBIC ONLY Blood Culture results may not be optimal due to an inadequate volume of blood received in culture bottles   Culture   Final    NO GROWTH 5 DAYS Performed at [facility], [address], [place], [place] [address]    Report Status 02/18/2024 FINAL  Final  MRSA Next Gen by PCR, Nasal     Status: None   Collection Time: 02/13/24 12:20 AM   Specimen: Nasal Mucosa; Nasal Swab  Result Value Ref Range Status   MRSA by PCR Next Gen NOT DETECTED NOT DETECTED Final    Comment: (NOTE) The GeneXpert MRSA Assay (FDA approved for NASAL specimens only), is one component of a comprehensive MRSA colonization surveillance program. It is not intended to diagnose MRSA infection nor to guide or monitor treatment for MRSA infections. Test performance is not FDA approved in patients less than 21 years old. Performed at [facility], [address] [place], [place] [address]   Resp panel by RT-PCR (RSV, Flu A&B, Covid) Nasal Mucosa     Status: None   Collection Time: 02/13/24 12:30 AM   Specimen: Nasal Mucosa; Nasal Swab  Result Value Ref Range Status   SARS Coronavirus 2 by RT PCR NEGATIVE NEGATIVE Final   Influenza A by  PCR NEGATIVE NEGATIVE Final   Influenza B by PCR NEGATIVE NEGATIVE Final    Comment: (NOTE) The Xpert Xpress SARS-CoV-2/FLU/RSV plus assay is intended as an aid in the diagnosis of influenza from Nasopharyngeal swab specimens and should not be used as a sole basis for treatment. Nasal washings and aspirates are unacceptable for Xpert Xpress SARS-CoV-2/FLU/RSV testing.  Fact Sheet for Patients: BloggerCourse.com  Fact Sheet for Healthcare Providers: SeriousBroker.it  This test is not yet approved or cleared by the [place]  FDA and has been authorized for detection and/or diagnosis of SARS-CoV-2 by FDA under an Emergency Use Authorization (EUA). This EUA will remain in effect (meaning this test can be used) for the duration of the COVID-19 declaration under Section 564(b)(1) of the Act, 21 U.S.C. section 360bbb-3(b)(1), unless the authorization is terminated or revoked.     Resp Syncytial Virus by PCR NEGATIVE NEGATIVE Final    Comment: (NOTE) Fact Sheet for Patients: BloggerCourse.com  Fact Sheet for Healthcare Providers: SeriousBroker.it  This test is not yet approved or cleared by the [place]  FDA and has been authorized for detection and/or diagnosis of SARS-CoV-2 by FDA under an Emergency Use Authorization (EUA). This EUA will remain in effect (meaning this test can be used) for the duration of the COVID-19 declaration under Section 564(b)(1) of the Act, 21 U.S.C. section 360bbb-3(b)(1), unless the authorization is terminated or revoked.  Performed at [facility], [address] [place], [place] [address]   Culture, blood (Routine X 2) w Reflex to ID Panel     Status: None   Collection Time: 02/13/24  4:33 PM   Specimen: BLOOD LEFT HAND  Result Value Ref Range Status   Specimen Description BLOOD LEFT HAND  Final   Special Requests   Final    BOTTLES  DRAWN AEROBIC AND ANAEROBIC Blood Culture adequate volume   Culture   Final    NO GROWTH 5 DAYS Performed at [facility], [address] [place], [place] [address]    Report Status 02/18/2024 FINAL  Final  Culture, blood (Routine X 2) w Reflex to ID Panel     Status: None   Collection Time: 02/13/24  4:34 PM   Specimen: BLOOD  Result Value Ref Range Status   Specimen Description BLOOD SITE NOT SPECIFIED  Final   Special Requests   Final    BOTTLES DRAWN AEROBIC AND ANAEROBIC Blood Culture results may not be optimal due to an inadequate volume of blood received in culture bottles   Culture   Final    NO GROWTH 5 DAYS Performed at [facility], [address], [place], [place] [address]    Report Status 02/18/2024 FINAL  Final  Culture, blood (Routine X 2) w Reflex to ID Panel     Status: None (Preliminary result)   Collection Time: 02/15/24  2:20 AM   Specimen: BLOOD LEFT HAND  Result Value Ref Range Status   Specimen Description BLOOD LEFT HAND  Final   Special Requests   Final    BOTTLES DRAWN AEROBIC AND ANAEROBIC Blood Culture adequate volume   Culture   Final    NO GROWTH 3 DAYS Performed at [facility], [address], [place], [place] [address]    Report Status PENDING  Incomplete  Culture, blood (Routine X 2) w Reflex to ID Panel     Status: None (Preliminary result)   Collection Time: 02/15/24  2:29 AM   Specimen: BLOOD  Result Value Ref Range Status   Specimen Description BLOOD LEFT ANTECUBITAL  Final   Special Requests   Final    BOTTLES DRAWN AEROBIC AND ANAEROBIC Blood Culture adequate volume   Culture   Final    NO GROWTH 3 DAYS Performed at [facility], [address] [place], [place] [address]    Report Status PENDING  Incomplete    [name], MSN, NP-C [facility]  [email or web address] Pager: [phone] Office: [phone] [facility] Main Line:  [phone] *Secure Chat Communication Welcome

## 2024-02-18 NOTE — Progress Notes (Signed)
 PHARMACY CONSULT NOTE FOR:  OUTPATIENT  PARENTERAL ANTIBIOTIC THERAPY (OPAT)  Indication: culture negative prosthetic valve endocarditis Regimen: Daptomycin 500mg  IV q24h and Ceftriaxone  2g IV q24h End date: 03/25/2024  IV antibiotic discharge orders are pended. To discharging provider:  please sign these orders via discharge navigator,  Select New Orders & click on the button choice - Manage This Unsigned Work.     Thank you for allowing pharmacy to be a part of this patient's care.  Sherre Docker 02/18/2024, 11:30 AM

## 2024-02-19 ENCOUNTER — Other Ambulatory Visit: Payer: Self-pay

## 2024-02-19 DIAGNOSIS — C250 Malignant neoplasm of head of pancreas: Secondary | ICD-10-CM

## 2024-02-19 DIAGNOSIS — T82857D Stenosis of cardiac prosthetic devices, implants and grafts, subsequent encounter: Secondary | ICD-10-CM

## 2024-02-19 DIAGNOSIS — I480 Paroxysmal atrial fibrillation: Secondary | ICD-10-CM | POA: Diagnosis not present

## 2024-02-19 DIAGNOSIS — Z952 Presence of prosthetic heart valve: Secondary | ICD-10-CM | POA: Diagnosis not present

## 2024-02-19 DIAGNOSIS — Z8673 Personal history of transient ischemic attack (TIA), and cerebral infarction without residual deficits: Secondary | ICD-10-CM

## 2024-02-19 DIAGNOSIS — I5033 Acute on chronic diastolic (congestive) heart failure: Secondary | ICD-10-CM

## 2024-02-19 DIAGNOSIS — A419 Sepsis, unspecified organism: Secondary | ICD-10-CM | POA: Diagnosis not present

## 2024-02-19 DIAGNOSIS — E1100 Type 2 diabetes mellitus with hyperosmolarity without nonketotic hyperglycemic-hyperosmolar coma (NKHHC): Secondary | ICD-10-CM

## 2024-02-19 DIAGNOSIS — J9601 Acute respiratory failure with hypoxia: Secondary | ICD-10-CM | POA: Diagnosis not present

## 2024-02-19 DIAGNOSIS — Z794 Long term (current) use of insulin: Secondary | ICD-10-CM

## 2024-02-19 DIAGNOSIS — Z8719 Personal history of other diseases of the digestive system: Secondary | ICD-10-CM

## 2024-02-19 LAB — GLUCOSE, CAPILLARY
Glucose-Capillary: 126 mg/dL — ABNORMAL HIGH (ref 70–99)
Glucose-Capillary: 183 mg/dL — ABNORMAL HIGH (ref 70–99)

## 2024-02-19 LAB — C-REACTIVE PROTEIN: CRP: 3.4 mg/dL — ABNORMAL HIGH (ref <1.0)

## 2024-02-19 MED ORDER — HEPARIN SOD (PORK) LOCK FLUSH 100 UNIT/ML IV SOLN
500.0000 [IU] | INTRAVENOUS | Status: AC | PRN
  Administered 2024-02-19: 500 [IU]

## 2024-02-19 NOTE — Discharge Summary (Signed)
 Physician Discharge Summary  Janet Williamson EAV:[id number] DOB: [birth year] DOA: 83/04/2024  PCP: Benedetto Brady, MD  Admit date: 83/04/2024 Discharge date: 02/19/24  Admitted From: Home Disposition: Home Recommendations for Outpatient Follow-up:  Outpatient follow-up with ID as below Check cardiology to arrange outpatient follow-up Please follow up on the following pending results: None  Home Health: None Equipment/Devices: None  Discharge Condition: Stable CODE STATUS: DNR-Limited  Follow-up Information     [facility] [name] Follow up on 03/10/2024.   Specialty: Infectious Diseases Why: MYCHART VIRTUAL VISIT 83/3/25 @ 1:30 PM with Janet Kurtz, NP - please change to inperson if you prefer or if you have any problems with PORT or [facility] Contact information: [address] [place], [address] [place]  [address] [phone]        Janet Mcbride, MD Follow up on 03/25/2024.   Specialty: Infectious Diseases Why: In Office Follow Up Visit 03/25/24 @ 9:30 AM with Dr. Cephas Williamson, Hospital Discharge Follow Up Contact information: [address] [place] [address] [phone]         [facility] Follow up.   Why: Home health has been arranged. Contact information: [address] [place] [address] [phone]                 Hospital course 83 year old F with PMH of pancreatic adenocarcinoma on gemcitabine ,TAVR, PAF, CVA, diabetes, HTN, GI bleed presenting with dyspnea, dry cough, some chest pain and two-pillow orthopnea, and admitted with working diagnosis of acute respiratory failure with hypoxia and sepsis in the setting of possible pneumonia and pulmonary edema.  She had mild leukocytosis with tachycardia, tachypnea.  Pro-Cal was elevated to 3.13.  Slightly elevated troponin at 32, 82 and 98.  BNP was 815.  CXR showed concern for diffuse airspace opacities  concerning for pulmonary edema.  Patient was started on antibiotics and diuretics.  Cardiology consulted.    TTE showed LVEF of 55 to 60%, moderate concentric LVH, G1 DD, mod-sev MR, mod-sev prosthetic AR, moderate PR and normal RVSP.  Echo also raise concern for potential aortic valve vegetation and recommending to consider TEE.  However, blood cultures were negative.  After evaluation by cardiology, she was deemed to be not a candidate for any intervention.  ID consulted and she is being treated for culture-negative endocarditis with daptomycin and Rocephin    On the day of discharge, patient felt well and ready to go home.  She is discharged on daptomycin and ceftriaxone  until 03/25/2024 per recommendation by ID.  Outpatient follow-up with ID as above.  Cardiology to arrange outpatient follow-up.  See individual problem list below for more.   Problems addressed during this hospitalization  Acute hypoxic respiratory failure-multifactorial including pulmonary edema and pneumonia.  Resolved.   Elevated troponin: Likely demand ischemia.  TTE as above.   Sepsis due to pneumonia: Present on admission.  Had tachycardia, tachypnea and leukocytosis. -Completed course of ceftriaxone  and Zithromax .   Acute diastolic CHF/pulmonary edema: Diuresed with IV Lasix .  Pulmonary edema resolved.  Liberated off oxygen. -P.o. 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  -ID recommended treating as culture-negative prosthetic valve endocarditis -She is discharged on daptomycin and Rocephin  through her port until 03/25/2024 - Outpatient follow-up with  ID as above   Paroxysmal A-fib: Rate controlled. -Continue home Xarelto  -Metoprolol  reduced    Malignant neoplasm of head of pancreas [name]): Diagnosed 10/16/23 with EUS confirming adenocarcinoma -  follows with oncology, Dr. [name]. Did not tolerate gemcitabine /Abraxane ; now on only gemcitabine . Completed day 8 cycle 4 on 02/12/2024. Follow-up MRI liver findings with oncology (concern for met).  Oncology notified of patient's admission. -Outpatient follow-up  Pancreatic insufficiency - Continue home Creon    History of GI bleed -Continue Protonix    History of stroke - on xarelto  and zocor    HTN (hypertension) - Normotensive for low-dose metoprolol .   DM-2: A1c 7.1% on 4/7. -Continue home metformin             Time spent 35 minutes  Vital signs Vitals:   02/19/24 0528 02/19/24 0716 02/19/24 0949 02/19/24 1123  BP: 124/60 (!) 116/41 (!) 128/51 (!) 136/56  Pulse: 73 83 78 70  Temp: 98.1 F (36.7 C) 98.5 F (36.9 C)  98.1 F (36.7 C)  Resp: 14 20  15   Height:      Weight: 68.7 kg     SpO2: 96% 98%  97%  TempSrc: Oral Oral  Oral  BMI (Calculated): 26.84        Discharge exam  GENERAL: No apparent distress.  Nontoxic. HEENT: MMM.  Vision and hearing grossly intact.  NECK: Supple.  No apparent JVD.  RESP:  No IWOB.  Fair aeration bilaterally. CVS:  RRR. Heart sounds normal.  ABD/GI/GU: BS+. Abd soft, NTND.  MSK/EXT:  Moves extremities. No apparent deformity. No edema.  SKIN: no apparent skin lesion or wound NEURO: Awake and alert. Oriented appropriately.  No apparent focal neuro deficit. PSYCH: Calm. Normal affect.   Discharge Instructions Discharge Instructions     [facility] pharmacist to adjust dose for Vancomycin , Aminoglycosides and other anti-infective therapies as requested by physician.   Complete by: As directed    [facility] to provide Cath Flo 2mg    Complete by: As directed    Administer for PICC line occlusion and as ordered by physician for other access device issues.   Anaphylaxis Kit: Provided to treat any anaphylactic reaction to the medication being provided to the patient if First Dose or when requested by physician    Complete by: As directed    Epinephrine  1mg /ml vial / amp: Administer 0.3mg  (0.50ml) subcutaneously once for moderate to severe anaphylaxis, nurse to call physician and pharmacy when reaction occurs and call 911 if needed for immediate care   Diphenhydramine  50mg /ml IV vial: Administer 25-50mg  IV/IM PRN for first dose reaction, rash, itching, mild reaction, nurse to call physician and pharmacy when reaction occurs   Sodium Chloride  0.9% NS 500ml IV: Administer if needed for hypovolemic blood pressure drop or as ordered by physician after call to physician with anaphylactic reaction   Change dressing on IV access line weekly and PRN   Complete by: As directed    Flush IV access with Sodium Chloride  0.9% and Heparin  10 units/ml or 100 units/ml   Complete by: As directed    Home infusion instructions - [facility]   Complete by: As directed    Instructions: Flush IV access with Sodium Chloride  0.9% and Heparin  10units/ml or 100units/ml   Change dressing on IV access line: Weekly and PRN   Instructions Cath Flo 2mg : Administer for PICC Line occlusion and as ordered by physician for other access device   [facility] pharmacist to adjust dose for: Vancomycin , Aminoglycosides  and other anti-infective therapies as requested by physician   Method of administration may be changed at the discretion of home infusion pharmacist based upon assessment of the patient and/or caregiver's ability to self-administer the medication ordered   Complete by: As directed    Outpatient Parenteral Antibiotic Therapy Information Antibiotic: Ceftriaxone  (Rocephin ) IVPB, Daptomycin (Cubicin) IVPB; Indications for use: culture negative prosthetic valve endocarditis; End Date: 03/25/2024   Complete by: As directed    Antibiotic:  Ceftriaxone  (Rocephin ) IVPB Daptomycin (Cubicin) IVPB     Indications for use: culture negative prosthetic valve endocarditis   End Date: 03/25/2024      Allergies as of  02/19/2024       Reactions   Ace Inhibitors Swelling, Other (See Comments)   Angioedema   Keflex [cephalexin] Swelling, Other (See Comments)   Lips became swollen   Rifadin [rifampin] Swelling, Other (See Comments)   Lips became swollen        Medication List     STOP taking these medications    hydrochlorothiazide  25 MG tablet Commonly known as: HYDRODIURIL    metoprolol  succinate 100 MG 24 hr tablet Commonly known as: TOPROL -XL   ondansetron  8 MG tablet Commonly known as: ZOFRAN    oxyCODONE  5 MG immediate release tablet Commonly known as: Oxy IR/ROXICODONE    potassium chloride  SA 20 MEQ tablet Commonly known as: KLOR-CON  M   prochlorperazine  10 MG tablet Commonly known as: COMPAZINE    simvastatin  20 MG tablet Commonly known as: ZOCOR        TAKE these medications    acetaminophen  325 MG tablet Commonly known as: TYLENOL  Take 325-650 mg by mouth every 8 (eight) hours as needed (for headaches).   amLODipine  5 MG tablet Commonly known as: NORVASC  Take 5 mg by mouth daily.   cefTRIAXone  IVPB Commonly known as: ROCEPHIN  Inject 2 g into the vein daily. 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   daptomycin IVPB Commonly known as: CUBICIN Inject 500 mg into the vein daily. Indication:  culture negative prosthetic valve endocarditis First Dose: Yes Last Day of Therapy:  03/25/2024 Labs - Once weekly:  CBC/D, BMP, and CPK Labs - Once weekly: ESR and CRP Method of administration: IV Push Method of administration may be changed at the discretion of home infusion pharmacist based upon assessment of the  patient and/or caregiver's ability to self-administer the medication ordered.   furosemide  20 MG tablet Commonly known as: LASIX  Take 1 tablet (20 mg total) by mouth daily.   lidocaine -prilocaine  cream Commonly known as: EMLA  Apply 1 Application topically as needed. Apply a quarter size amount of cream to port-a-cath site 45 mins to 1 hr prior to port-a-cath being access. What changed:  reasons to take this additional instructions   lipase/protease/amylase 16109 UNITS Cpep capsule Commonly known as: Creon  Take 2 capsules (72,000 Units total) by mouth 3 (three) times daily with meals. May also take 1 capsule (36,000 Units total) as needed (with snacks - up to 4 snacks daily). What changed: See the new instructions.   loperamide  2 MG capsule Commonly known as: IMODIUM  Take 1 capsule (2 mg total) by mouth as needed for diarrhea or loose stools.   metFORMIN   750 MG 24 hr tablet Commonly known as: GLUCOPHAGE -XR TAKE 2 TABLETS ONE TIME DAILY WITH BREAKFAST   metoprolol  tartrate 25 MG tablet Commonly known as: LOPRESSOR  Take 1 tablet (25 mg total) by mouth 2 (two) times daily. What changed:  when to take this reasons to take this   Accu-Chek Guide Me w/Device Kit by Does not apply route.   ONE TOUCH ULTRA 2 w/Device Kit Use to check blood sugars three times daily DX: E11.9   OneTouch Delica Plus Lancet33G Misc TEST BLOOD SUGAR THREE TIMES DAILY   OneTouch Ultra Test test strip Generic drug: glucose blood TEST BLOOD SUGAR THREE TIMES DAILY   pantoprazole  20 MG tablet Commonly known as: Protonix  Take 1 tablet (20 mg total) by mouth daily. What changed:  when to take this reasons to take this   rivaroxaban  20 MG Tabs tablet Commonly known as: XARELTO  Take 1 tablet (20 mg total) by mouth daily with supper. What changed: when to take this   SYSTANE OP Place 1 drop into both eyes 2 (two) times daily.   Vitamin D3 125 MCG (5000 UT) Caps Take 5,000 Units by mouth  daily.               Discharge Care Instructions  (From admission, onward)           Start     Ordered   02/18/24 0000  Change dressing on IV access line weekly and PRN  (Home infusion instructions - [facility] )        02/18/24 1246            Consultations: Cardiology Infectious disease  Procedures/Studies:   MR LIVER W WO CONTRAST Result Date: 02/15/2024 CLINICAL DATA:  Re-staging pancreatic cancer. EXAM: MRI ABDOMEN WITHOUT AND WITH CONTRAST TECHNIQUE: Multiplanar multisequence MR imaging of the abdomen was performed both before and after the administration of intravenous contrast. CONTRAST:  7mL GADAVIST  GADOBUTROL  1 MMOL/ML IV SOLN COMPARISON:  Follow-up prior CT scan 01/29/2024 and PET-CT 11/01/2023. FINDINGS: Lower chest: The lung bases are grossly clear. No pulmonary lesions, pleural or pericardial effusion. Hepatobiliary: Overall stable intrahepatic biliary dilatation and pneumobilia related to a biliary stent. 10 mm lesion in segment 4A of the liver not for certain seen on prior imaging studies. This has slight increased T2 signal intensity and demonstrates delayed rim like contrast enhancement. Findings suspicious for a metastatic focus. No other hepatic lesions are identified. Pancreas: Stable large infiltrating mass in the pancreatic head measuring approximately 4.3 x 3.4 cm. Associated marked dilatation of the main pancreatic duct which measures a maximum 11 mm. Stable severe atrophy involving the body and tail region of the pancreas. Spleen:  Within normal limits in size and appearance. Adrenals/Urinary Tract: The adrenal glands and kidneys are unremarkable and stable. Stomach/Bowel: The stomach, duodenum, visualized small and visualized colon are unremarkable. Vascular/Lymphatic: The aorta and branch vessels are patent. The major venous structures are patent. No mesenteric or retroperitoneal lymphadenopathy the. Other:  No ascites or abdominal wall  hernia. Musculoskeletal: No findings suspicious for osseous metastatic disease. IMPRESSION: 1. Stable large infiltrating mass in the pancreatic head measuring approximately 4.3 x 3.4 cm. Associated marked dilatation of the main pancreatic duct and severe atrophy involving the body and tail region of the pancreas. 2. 10 mm lesion in segment 4A of the liver not for certain seen on prior imaging studies. This has slight increased T2 signal intensity and demonstrates delayed rim like contrast enhancement. Findings suspicious for a metastatic  focus. 3. Stable intrahepatic biliary dilatation and pneumobilia related to a biliary stent. Electronically Signed   By: Marrian Siva M.D.   On: 02/15/2024 18:11   DG CHEST PORT 1 VIEW Result Date: 02/15/2024 CLINICAL DATA:  Follow-up pneumonia. EXAM: PORTABLE CHEST 1 VIEW COMPARISON:  02/12/2024 FINDINGS: The cardiac silhouette, mediastinal hilar contours are stable. Stable tortuosity and calcification of the thoracic aorta. Stable right IJ Port-A-Cath. Stable surgical changes from TAVR. The lungs demonstrate much improved aeration with resolving bilateral pulmonary infiltrates. No pleural effusions or pneumothorax. IMPRESSION: Much improved lung aeration with resolving bilateral pulmonary infiltrates. Electronically Signed   By: Marrian Siva M.D.   On: 02/15/2024 17:47   ECHOCARDIOGRAM COMPLETE Result Date: 02/13/2024    ECHOCARDIOGRAM REPORT   Patient Name:   ALICHIA OMALLEY Date of Exam: 02/13/2024 Medical Rec #:  [id number]         Height:       63.0 in Accession #:    [id number]        Weight:       154.2 lb Date of Birth:  1941/04/03          BSA:          1.732 m Patient Age:    83 years          BP:           105/45 mmHg Patient Gender: F                 HR:           72 bpm. Exam Location:  Inpatient Procedure: 2D Echo, 3D Echo, Cardiac Doppler and Color Doppler (Both Spectral            and Color Flow Doppler were utilized during procedure). Indications:    I50.40*  Unspecified combined systolic (congestive) and diastolic                 (congestive) heart failure  History:        Patient has prior history of Echocardiogram examinations, most                 recent 03/20/2019. Abnormal ECG, Stroke, Aortic Valve Disease,                 Arrythmias:LBBB, Signs/Symptoms:Edema and Dyspnea; Risk                 Factors:Hypertension and Diabetes. Aortic stenosis. TAVR.                 Metastatic cancer. Chemo.                 Aortic Valve: 23 mm Edwards Sapien prosthetic, stented (TAVR)                 valve is present in the aortic position. Procedure Date:                 04/15/2018.  Sonographer:    Raynelle Callow RDCS Referring Phys: [id number] [facility] IMPRESSIONS  1. Left ventricular ejection fraction, by estimation, is 55 to 60%. The left ventricle has normal function. The left ventricle has no regional wall motion abnormalities. There is moderate concentric left ventricular hypertrophy. Left ventricular diastolic parameters are consistent with Grade I diastolic dysfunction (impaired relaxation). Elevated left ventricular end-diastolic pressure.  2. Right ventricular systolic function is normal. The right ventricular size is normal. There is mildly elevated pulmonary artery systolic pressure.  3. Left atrial  size was mildly dilated.  4. The mitral valve is degenerative. Moderate to severe mitral valve regurgitation. Mild mitral stenosis. Moderate mitral annular calcification.  5. Tricuspid valve regurgitation is mild to moderate.  6. TAVR valve is thickened and gradients are severely elevated consistent with prosthetic valve stenosis. Mean gradient has increased to 55 mmHg from 30 mmHg 03/2019. The aortic valve has been repaired/replaced. There is severe calcifcation of the aortic  valve. There is severe thickening of the aortic valve. Aortic valve regurgitation is moderate to severe. There is a 23 mm Edwards Sapien prosthetic (TAVR) valve present in the aortic position. Procedure  Date: 04/15/2018. Echo findings are consistent with stenosis and regurgitation of the aortic prosthesis. Aortic valve area, by VTI measures 1.07 cm. Aortic valve mean gradient measures 55.0 mmHg. Aortic valve Vmax measures 4.94 m/s.  7. Pulmonic valve regurgitation is moderate.  8. The inferior vena cava is dilated in size with >50% respiratory variability, suggesting right atrial pressure of 8 mmHg. Conclusion(s)/Recommendation(s): Findings concerning for aortic valve vegetation, would recommend a Transesophageal Echocardiogram for clarification. FINDINGS  Left Ventricle: Left ventricular ejection fraction, by estimation, is 55 to 60%. The left ventricle has normal function. The left ventricle has no regional wall motion abnormalities. The left ventricular internal cavity size was normal in size. There is  moderate concentric left ventricular hypertrophy. Left ventricular diastolic parameters are consistent with Grade I diastolic dysfunction (impaired relaxation). Elevated left ventricular end-diastolic pressure. Right Ventricle: The right ventricular size is normal. No increase in right ventricular wall thickness. Right ventricular systolic function is normal. There is mildly elevated pulmonary artery systolic pressure. The tricuspid regurgitant velocity is 2.94  m/s, and with an assumed right atrial pressure of 8 mmHg, the estimated right ventricular systolic pressure is 42.6 mmHg. Left Atrium: Left atrial size was mildly dilated. Right Atrium: Right atrial size was normal in size. Pericardium: There is no evidence of pericardial effusion. Mitral Valve: The mitral valve is degenerative in appearance. There is moderate thickening of the mitral valve leaflet(s). There is mild calcification of the mitral valve leaflet(s). Moderate mitral annular calcification. Moderate to severe mitral valve regurgitation. Mild mitral valve stenosis. MV peak gradient, 10.4 mmHg. The mean mitral valve gradient is 4.0 mmHg. Tricuspid  Valve: The tricuspid valve is normal in structure. Tricuspid valve regurgitation is mild to moderate. No evidence of tricuspid stenosis. Aortic Valve: TAVR valve is thickened and gradients are severely elevated consistent with prosthetic valve stenosis. Mean gradient has increased to 55 mmHg from 30 mmHg 03/2019. The aortic valve has been repaired/replaced. There is severe calcifcation of the aortic valve. There is severe thickening of the aortic valve. Aortic valve regurgitation is moderate to severe. Aortic regurgitation PHT measures 409 msec. Aortic valve mean gradient measures 55.0 mmHg. Aortic valve peak gradient measures 97.6 mmHg. Aortic valve area, by VTI measures 1.07 cm. There is a 23 mm Edwards Sapien prosthetic, stented (TAVR) valve present in the aortic position. Procedure Date: 04/15/2018. Pulmonic Valve: The pulmonic valve was grossly normal. Pulmonic valve regurgitation is moderate. No evidence of pulmonic stenosis. Aorta: The aortic root and ascending aorta are structurally normal, with no evidence of dilitation. Venous: The inferior vena cava is dilated in size with greater than 50% respiratory variability, suggesting right atrial pressure of 8 mmHg. IAS/Shunts: No atrial level shunt detected by color flow Doppler. Additional Comments: 3D was performed not requiring image post processing on an independent workstation and was abnormal.  LEFT VENTRICLE PLAX 2D LVIDd:  3.80 cm     Diastology LVIDs:         2.60 cm     LV e' medial:    4.24 cm/s LV PW:         1.30 cm     LV E/e' medial:  30.1 LV IVS:        1.40 cm     LV e' lateral:   5.11 cm/s LVOT diam:     2.30 cm     LV E/e' lateral: 25.0 LV SV:         116 LV SV Index:   67 LVOT Area:     4.15 cm  LV Volumes (MOD) LV vol d, MOD A2C: 87.2 ml LV vol d, MOD A4C: 86.3 ml LV vol s, MOD A2C: 44.6 ml LV vol s, MOD A4C: 38.9 ml LV SV MOD A2C:     42.6 ml LV SV MOD A4C:     86.3 ml LV SV MOD BP:      46.3 ml RIGHT VENTRICLE             IVC RV S  prime:     10.90 cm/s  IVC diam: 2.20 cm TAPSE (M-mode): 2.6 cm LEFT ATRIUM             Index        RIGHT ATRIUM           Index LA diam:        4.30 cm 2.48 cm/m   RA Area:     12.60 cm LA Vol (A2C):   53.3 ml 30.78 ml/m  RA Volume:   27.80 ml  16.06 ml/m LA Vol (A4C):   56.0 ml 32.34 ml/m LA Biplane Vol: 54.7 ml 31.59 ml/m  AORTIC VALVE                     PULMONIC VALVE AV Area (Vmax):    1.03 cm      PR End Diast Vel: 1.67 msec AV Area (Vmean):   1.06 cm AV Area (VTI):     1.07 cm AV Vmax:           494.00 cm/s AV Vmean:          336.500 cm/s AV VTI:            1.080 m AV Peak Grad:      97.6 mmHg AV Mean Grad:      55.0 mmHg LVOT Vmax:         123.00 cm/s LVOT Vmean:        86.200 cm/s LVOT VTI:          0.278 m LVOT/AV VTI ratio: 0.26 AI PHT:            409 msec  AORTA Ao Root diam: 2.80 cm Ao Asc diam:  3.70 cm MITRAL VALVE                  TRICUSPID VALVE MV Area (PHT): 3.10 cm       TR Peak grad:   34.6 mmHg MV Area VTI:   2.62 cm       TR Vmax:        294.00 cm/s MV Peak grad:  10.4 mmHg MV Mean grad:  4.0 mmHg       SHUNTS MV Vmax:       1.61 m/s       Systemic VTI:  0.28 m MV Vmean:  99.8 cm/s      Systemic Diam: 2.30 cm MV Decel Time: 245 msec MR Peak grad:    180.1 mmHg MR Mean grad:    77.0 mmHg MR Vmax:         671.00 cm/s MR Vmean:        372.0 cm/s MR PISA:         3.08 cm MR PISA Eff ROA: 18 mm MR PISA Radius:  0.70 cm MV E velocity: 127.50 cm/s MV A velocity: 137.50 cm/s MV E/A ratio:  0.93 Maudine Sos MD Electronically signed by Maudine Sos MD Signature Date/Time: 02/13/2024/12:36:11 PM    Final    DG Chest Portable 1 View Result Date: 02/12/2024 CLINICAL DATA:  Shortness of breath EXAM: PORTABLE CHEST 1 VIEW COMPARISON:  02/09/2024 FINDINGS: Check shadow is stable. Changes of prior TAVR are noted. Right chest wall port is again seen. Aortic calcifications are noted. The lungs are well aerated bilaterally. Diffuse airspace opacity is noted right greater than left  consistent with diffuse edema given the abrupt onset. No sizable effusion is seen. No bony abnormality is noted. IMPRESSION: Diffuse airspace opacities bilaterally likely related to pulmonary edema. Electronically Signed   By: Violeta Grey M.D.   On: 02/12/2024 22:17   DG Chest Port 1 View Result Date: 02/09/2024 CLINICAL DATA:  Shortness of breath EXAM: PORTABLE CHEST 1 VIEW COMPARISON:  05/17/2019 FINDINGS: Changes of prior TAVR are again seen. Aortic calcifications are noted. New right chest wall port is seen in satisfactory position. Lungs are well aerated bilaterally. No focal infiltrate or sizable effusion is seen. No bony abnormality is noted. IMPRESSION: No active disease. Electronically Signed   By: Violeta Grey M.D.   On: 02/09/2024 19:36   CT PANCREAS ABDOMEN W WO CONTRAST Result Date: 02/04/2024 CLINICAL DATA:  Pancreatic cancer, assess treatment response. * Tracking Code: BO * EXAM: CT ABDOMEN WITHOUT AND WITH CONTRAST TECHNIQUE: Multidetector CT imaging of the abdomen was performed following the standard protocol before and following the bolus administration of intravenous contrast. RADIATION DOSE REDUCTION: This exam was performed according to the departmental dose-optimization program which includes automated exposure control, adjustment of the mA and/or kV according to patient size and/or use of iterative reconstruction technique. CONTRAST:  OMNIPAQUE  IOHEXOL  350 MG/ML SOLN COMPARISON:  10/08/2023 FINDINGS: Lower chest: 10 mm nodule within the right lower lobe, axial image # 4/6, is stable since remote prior examination of 04/09/2018 safely considered benign. A 4 mm adjacent pulmonary nodule within the right lower lobe was present on immediate prior examination, but was not seen remote prior examination and is technically indeterminate. This is unchanged, however. Cardiac size within normal limits. Hypoattenuation of the cardiac blood pool in keeping with at least mild anemia.  Hepatobiliary: Interval palliative stenting of the extrahepatic bile duct. Pneumobilia within the intra and extrahepatic biliary tree in keeping with patency of the stented segment. 11 mm indeterminate hypodense lesion is seen left hepatic lobe, axial image # 14/9 new since prior examination and demonstrating some degree of marginal irregular enhancement, suspicious for intrahepatic metastasis. Portal vein is patent. Gallbladder is unremarkable. Pancreas: The lobulated hypoenhancing mass within the head of the pancreas extending into the uncinate process is grossly stable measuring 3.8 x 4.7 x 3.2 cm in greatest dimension. The mass encases the superior mesenteric artery at axial image # 46/9 encases and severely narrows the superior mesenteric vein at axial image # 42/9 obstructs the central pancreatic duct which is stably, markedly dilated throughout  the more distal pancreas, and abuts the stented portion the extrahepatic. The mass extends into the small bowel mesentery inferiorly, similar to prior examination with occlusion of several mesenteric venous tributaries. The body and tail the pancreas demonstrates progressive parenchymal atrophy. No superimposed peripancreatic inflammatory changes are identified. The mass abuts but does not clearly a adjacent second and third portion of the duodenum. Spleen: Unremarkable Adrenals/Urinary Tract: Adrenal glands are unremarkable. Kidneys are normal, without renal calculi, focal lesion, or hydronephrosis. Stomach/Bowel: There is mild asymmetric wall thickening the visualized ascending and proximal transverse colon which may relate to impaired venous drainage related to occluded mesenteric venous collateral centrally. Visualized large and small bowel are otherwise. No free intraperitoneal gas or fluid. Vascular/Lymphatic: As noted above, the superior mesenteric vein is markedly narrowed and the superior mesenteric artery are encased by the malignant mass. Additionally,  numerous mesenteric veins are obliterated by the mass and there is collateralization to the gastroepiploic vein and inferior mesenteric vein which ultimately drain to the splenic vein. Other: No abdominal wall hernia Musculoskeletal: No acute bone abnormality. No lytic or blastic bone lesion. Osseous structures are age appropriate. IMPRESSION: 1. Grossly stable size and appearance of a 4.7 cm malignant mass within the head of the pancreas extending into the uncinate process. The mass encases the superior mesenteric artery and encases and severely narrows the superior mesenteric vein. The mass also extends into the small bowel mesentery inferiorly with occlusion of several mesenteric venous tributaries. 2. Interval palliative stenting of the extrahepatic bile duct with expected pneumobilia. 3. 11 mm indeterminate hypodense lesion within the left hepatic lobe, new since prior examination, suspicious for a intrahepatic metastasis. This could be confirmed with dedicated contrast enhanced MRI imaging or re-evaluated on subsequent examination. 4. 4 mm right lower lobe pulmonary nodule, stable since immediate prior examination, but not seen on remote prior examination. Close attention on follow-up imaging is warranted. 5. Hypoattenuation of the cardiac blood pool in keeping with at least mild anemia. 6. Mild asymmetric wall thickening of the ascending and proximal transverse colon which may relate to impaired venous drainage related to occluded mesenteric venous tributaries. Electronically Signed   By: Worthy Heads M.D.   On: 02/04/2024 19:51       The results of significant diagnostics from this hospitalization (including imaging, microbiology, ancillary and laboratory) are listed below for reference.     Microbiology: Recent Results (from the past 240 hours)  Culture, blood (routine x 2)     Status: None   Collection Time: 02/12/24 11:36 PM   Specimen: BLOOD RIGHT HAND  Result Value Ref Range Status    Specimen Description BLOOD RIGHT HAND  Final   Special Requests   Final    BOTTLES DRAWN AEROBIC ONLY Blood Culture results may not be optimal due to an inadequate volume of blood received in culture bottles   Culture   Final    NO GROWTH 5 DAYS Performed at [facility], [address], [place], [place] [address]    Report Status 02/18/2024 FINAL  Final  MRSA Next Gen by PCR, Nasal     Status: None   Collection Time: 02/13/24 12:20 AM   Specimen: Nasal Mucosa; Nasal Swab  Result Value Ref Range Status   MRSA by PCR Next Gen NOT DETECTED NOT DETECTED Final    Comment: (NOTE) The GeneXpert MRSA Assay (FDA approved for NASAL specimens only), is one component of a comprehensive MRSA colonization surveillance program. It is not intended to diagnose  MRSA infection nor to guide or monitor treatment for MRSA infections. Test performance is not FDA approved in patients less than 9 years old. Performed at [facility], [address], [place], [place] [address]   Resp panel by RT-PCR (RSV, Flu A&B, Covid) Nasal Mucosa     Status: None   Collection Time: 02/13/24 12:30 AM   Specimen: Nasal Mucosa; Nasal Swab  Result Value Ref Range Status   SARS Coronavirus 2 by RT PCR NEGATIVE NEGATIVE Final   Influenza A by PCR NEGATIVE NEGATIVE Final   Influenza B by PCR NEGATIVE NEGATIVE Final    Comment: (NOTE) The Xpert Xpress SARS-CoV-2/FLU/RSV plus assay is intended as an aid in the diagnosis of influenza from Nasopharyngeal swab specimens and should not be used as a sole basis for treatment. Nasal washings and aspirates are unacceptable for Xpert Xpress SARS-CoV-2/FLU/RSV testing.  Fact Sheet for Patients: BloggerCourse.com  Fact Sheet for Healthcare Providers: SeriousBroker.it  This test is not yet approved or cleared by the [place]  FDA and has been authorized for detection and/or diagnosis of SARS-CoV-2 by FDA under an  Emergency Use Authorization (EUA). This EUA will remain in effect (meaning this test can be used) for the duration of the COVID-19 declaration under Section 564(b)(1) of the Act, 21 U.S.C. section 360bbb-3(b)(1), unless the authorization is terminated or revoked.     Resp Syncytial Virus by PCR NEGATIVE NEGATIVE Final    Comment: (NOTE) Fact Sheet for Patients: BloggerCourse.com  Fact Sheet for Healthcare Providers: SeriousBroker.it  This test is not yet approved or cleared by the [place]  FDA and has been authorized for detection and/or diagnosis of SARS-CoV-2 by FDA under an Emergency Use Authorization (EUA). This EUA will remain in effect (meaning this test can be used) for the duration of the COVID-19 declaration under Section 564(b)(1) of the Act, 21 U.S.C. section 360bbb-3(b)(1), unless the authorization is terminated or revoked.  Performed at [facility], [address] [place], [place] [address]   Culture, blood (Routine X 2) w Reflex to ID Panel     Status: None   Collection Time: 02/13/24  4:33 PM   Specimen: BLOOD LEFT HAND  Result Value Ref Range Status   Specimen Description BLOOD LEFT HAND  Final   Special Requests   Final    BOTTLES DRAWN AEROBIC AND ANAEROBIC Blood Culture adequate volume   Culture   Final    NO GROWTH 5 DAYS Performed at [facility], [address], [place], [place] [address]    Report Status 02/18/2024 FINAL  Final  Culture, blood (Routine X 2) w Reflex to ID Panel     Status: None   Collection Time: 02/13/24  4:34 PM   Specimen: BLOOD  Result Value Ref Range Status   Specimen Description BLOOD SITE NOT SPECIFIED  Final   Special Requests   Final    BOTTLES DRAWN AEROBIC AND ANAEROBIC Blood Culture results may not be optimal due to an inadequate volume of blood received in culture bottles   Culture   Final    NO GROWTH 5 DAYS Performed at [facility], [address], [place], [place] [address]    Report Status 02/18/2024 FINAL  Final  Culture, blood (Routine X 2) w Reflex to ID Panel     Status: None (Preliminary result)   Collection Time: 02/15/24  2:20 AM   Specimen: BLOOD LEFT HAND  Result Value Ref Range Status  Specimen Description BLOOD LEFT HAND  Final   Special Requests   Final    BOTTLES DRAWN AEROBIC AND ANAEROBIC Blood Culture adequate volume   Culture   Final    NO GROWTH 4 DAYS Performed at [facility], [address] [place], [place] [address]    Report Status PENDING  Incomplete  Culture, blood (Routine X 2) w Reflex to ID Panel     Status: None (Preliminary result)   Collection Time: 02/15/24  2:29 AM   Specimen: BLOOD  Result Value Ref Range Status   Specimen Description BLOOD LEFT ANTECUBITAL  Final   Special Requests   Final    BOTTLES DRAWN AEROBIC AND ANAEROBIC Blood Culture adequate volume   Culture   Final    NO GROWTH 4 DAYS Performed at [facility], [address] [place], [place] [address]    Report Status PENDING  Incomplete     Labs:  CBC: Recent Labs  Lab 02/14/24 0257 02/15/24 0220 02/16/24 0915 02/17/24 0230 02/18/24 0243  WBC 6.1 9.7 6.3 5.8 3.6*  NEUTROABS 3.8 8.0* 4.7 3.3 1.3*  HGB 8.4* 9.0* 8.3* 8.1* 9.0*  HCT 26.9* 28.5* 26.8* 25.8* 28.8*  MCV 78.7* 78.5* 79.1* 78.4* 78.0*  PLT 171 182 185 172 156   BMP &GFR Recent Labs  Lab 02/13/24 0507 02/13/24 0521 02/14/24 0257 02/15/24 0220 02/16/24 0915 02/17/24 0230 02/18/24 0243  NA 135   < > 135 135 132* 136 137  K 3.8   < > 3.5 3.8 3.4* 3.6 3.7  CL 99  --  99 97* 93* 96* 98  CO2 26  --  29 30 29 31 30   GLUCOSE 100*  --  127* 124* 316* 128* 135*  BUN 10  --  9 10 11 8 9   CREATININE 0.65  --  0.79 0.70 0.66 0.57 0.73  CALCIUM  9.2  --  9.1 9.4 9.0 9.4 9.4  MG 1.7  --  1.6* 1.9 1.6* 1.7 1.6*  PHOS 3.6  --   --   --   --   --   --    < > = values in this interval not displayed.   Estimated Creatinine Clearance:  49.5 mL/min (by C-G formula based on SCr of 0.73 mg/dL). Liver & Pancreas: Recent Labs  Lab 02/12/24 2144  AST 29  ALT 21  ALKPHOS 73  BILITOT 0.5  PROT 8.1  ALBUMIN 3.1*   No results for input(s): "LIPASE", "AMYLASE" in the last 168 hours. No results for input(s): "AMMONIA" in the last 168 hours. Diabetic: No results for input(s): "HGBA1C" in the last 72 hours. Recent Labs  Lab 02/18/24 1118 02/18/24 1633 02/18/24 2111 02/19/24 0525 02/19/24 1143  GLUCAP 182* 204* 189* 126* 183*   Cardiac Enzymes: Recent Labs  Lab 02/18/24 0243  CKTOTAL 15*   No results for input(s): "PROBNP" in the last 8760 hours. Coagulation Profile: No results for input(s): "INR", "PROTIME" in the last 168 hours. Thyroid  Function Tests: No results for input(s): "TSH", "T4TOTAL", "FREET4", "T3FREE", "THYROIDAB" in the last 72 hours. Lipid Profile: No results for input(s): "CHOL", "HDL", "LDLCALC", "TRIG", "CHOLHDL", "LDLDIRECT" in the last 72 hours. Anemia Panel: No results for input(s): "VITAMINB12", "FOLATE", "FERRITIN", "TIBC", "IRON", "RETICCTPCT" in the last 72 hours. Urine analysis:    Component Value Date/Time   COLORURINE YELLOW 10/30/2018 0936   APPEARANCEUR Sl Cloudy (A) 10/30/2018 0936   LABSPEC 1.020 10/30/2018 0936   PHURINE 5.5 10/30/2018 8657  GLUCOSEU NEGATIVE 10/30/2018 0936   HGBUR NEGATIVE 10/30/2018 0936   BILIRUBINUR NEGATIVE 10/30/2018 0936   KETONESUR NEGATIVE 10/30/2018 0936   PROTEINUR NEGATIVE 09/30/2018 0013   UROBILINOGEN 0.2 10/30/2018 0936   NITRITE NEGATIVE 10/30/2018 0936   LEUKOCYTESUR NEGATIVE 10/30/2018 0936   Sepsis Labs: Invalid input(s): "PROCALCITONIN", "LACTICIDVEN"   SIGNED:  Theadore Finger, MD  [facility] 02/19/2024, 4:43 PM

## 2024-02-19 NOTE — Progress Notes (Signed)
 Physical Therapy Treatment Patient Details Name: Janet Williamson MRN: [id number] DOB: 27-Mar-1941 Today's Date: 02/19/2024   History of Present Illness 83 y.o. female who was brought in from home 02/13/24 with acute worsening shortness of breath. BiPAP; acute HF; elevated troponin due to demand ischemia; PAF>NSR 5/9;  PMH pancreatic adenocarcinoma possibly metastatic with indeterminant liver lesion currently under investigation (currently on chemotherapy), biliary stent, paroxysmal A-fib on anticoagulation, TAVR, CVA, diabetes, hypertension, GI bleed,   PT Comments  Pt in bed upon arrival and agreeable to PT session. Pt making steady progress towards acute PT goals. Pt was ModI/supervision for all mobility this date. Pt was able to ambulate 368ft with rollator and supervision without needing a seated/standing rest break. Pt feels comfortable d/c home with 24/7 assist available. Acute PT to follow.   HR 77-88 BPM with activity    If plan is discharge home, recommend the following: Assistance with cooking/housework;Assist for transportation   Can travel by private vehicle      Yes  Equipment Recommendations  None recommended by PT       Precautions / Restrictions Precautions Precautions: Fall Recall of Precautions/Restrictions: Intact Precaution/Restrictions Comments: watch sats; Target HR <110 Restrictions Weight Bearing Restrictions Per Provider Order: No     Mobility  Bed Mobility Overal bed mobility: Needs Assistance Bed Mobility: Supine to Sit    Supine to sit: HOB elevated, Modified independent (Device/Increase time)     Transfers Overall transfer level: Needs assistance Equipment used: Rollator (4 wheels) Transfers: Sit to/from Stand Sit to Stand: Supervision     General transfer comment: supervision for safety, able ot figure out locks on rollator through trial and error    Ambulation/Gait Ambulation/Gait assistance: Supervision Gait Distance (Feet): 300  Feet Assistive device: Rollator (4 wheels), None Gait Pattern/deviations: Step-through pattern, Decreased step length - right, Shuffle, Decreased dorsiflexion - right Gait velocity: decr    General Gait Details: steady with rollator, figured of brakes on rollator through trial and error     Balance Overall balance assessment: Needs assistance Sitting-balance support: No upper extremity supported, Feet unsupported Sitting balance-Leahy Scale: Good     Standing balance support: No upper extremity supported Standing balance-Leahy Scale: Fair Standing balance comment: able to stand with no UE support, rollator for gait     Communication Communication Communication: No apparent difficulties  Cognition Arousal: Alert Behavior During Therapy: WFL for tasks assessed/performed   PT - Cognitive impairments: No apparent impairments  Following commands: Intact      Cueing Cueing Techniques: Verbal cues  Exercises General Exercises - Lower Extremity Long Arc Quad: AROM, Both, 5 reps, Seated (x10 sec hold) Hip Flexion/Marching: AROM, Both, 5 reps, Seated        Pertinent Vitals/Pain Pain Assessment Pain Assessment: No/denies pain     PT Goals (current goals can now be found in the care plan section) Acute Rehab PT Goals PT Goal Formulation: With patient Time For Goal Achievement: 02/29/24 Potential to Achieve Goals: Good Progress towards PT goals: Progressing toward goals    Frequency    Min 2X/week       AM-PAC PT "6 Clicks" Mobility   Outcome Measure  Help needed turning from your back to your side while in a flat bed without using bedrails?: None Help needed moving from lying on your back to sitting on the side of a flat bed without using bedrails?: None Help needed moving to and from a bed to a chair (including a wheelchair)?: A Little Help needed standing  up from a chair using your arms (e.g., wheelchair or bedside chair)?: A Little Help needed to walk in  hospital room?: A Little Help needed climbing 3-5 steps with a railing? : A Little 6 Click Score: 20    End of Session Equipment Utilized During Treatment: Gait belt Activity Tolerance: Patient tolerated treatment well Patient left: in chair;with call bell/phone within reach;with nursing/sitter in room Nurse Communication: Mobility status PT Visit Diagnosis: Other abnormalities of gait and mobility (R26.89);Muscle weakness (generalized) (M62.81)     Time: 2130-8657 PT Time Calculation (min) (ACUTE ONLY): 23 min  Charges:    $Gait Training: 8-22 mins $Therapeutic Exercise: 8-22 mins PT General Charges $$ ACUTE PT VISIT: 1 Visit                    Orysia Blas, PT, DPT Secure Chat Preferred  Rehab Office [phone]  Alissa April Adela Ades 02/19/2024, 11:53 AM

## 2024-02-19 NOTE — Plan of Care (Signed)
 Discharge summary given to patient and questions answered. Family at the bedside.  R AC PIV removed. R chest port needle and dressing was changed today by IV team and is intact upon discharge.  Daptomycin and Ceftriaxone  antibiotics administered through port at 12:30pm today before discharge and Home Health infusion contact information provided in AVS.  Medications to be filled by outpatient pharmacy.  Problem: Education: Goal: Ability to describe self-care measures that may prevent or decrease complications (Diabetes Survival Skills Education) will improve Outcome: Adequate for Discharge Goal: Individualized Educational Video(s) Outcome: Adequate for Discharge   Problem: Coping: Goal: Ability to adjust to condition or change in health will improve Outcome: Adequate for Discharge   Problem: Fluid Volume: Goal: Ability to maintain a balanced intake and output will improve Outcome: Adequate for Discharge   Problem: Health Behavior/Discharge Planning: Goal: Ability to identify and utilize available resources and services will improve Outcome: Adequate for Discharge Goal: Ability to manage health-related needs will improve Outcome: Adequate for Discharge   Problem: Metabolic: Goal: Ability to maintain appropriate glucose levels will improve Outcome: Adequate for Discharge   Problem: Nutritional: Goal: Maintenance of adequate nutrition will improve Outcome: Adequate for Discharge Goal: Progress toward achieving an optimal weight will improve Outcome: Adequate for Discharge   Problem: Skin Integrity: Goal: Risk for impaired skin integrity will decrease Outcome: Adequate for Discharge   Problem: Tissue Perfusion: Goal: Adequacy of tissue perfusion will improve Outcome: Adequate for Discharge   Problem: Education: Goal: Knowledge of General Education information will improve Description: Including pain rating scale, medication(s)/side effects and non-pharmacologic comfort  measures Outcome: Adequate for Discharge   Problem: Health Behavior/Discharge Planning: Goal: Ability to manage health-related needs will improve Outcome: Adequate for Discharge   Problem: Clinical Measurements: Goal: Ability to maintain clinical measurements within normal limits will improve Outcome: Adequate for Discharge Goal: Will remain free from infection Outcome: Adequate for Discharge Goal: Diagnostic test results will improve Outcome: Adequate for Discharge Goal: Respiratory complications will improve Outcome: Adequate for Discharge Goal: Cardiovascular complication will be avoided Outcome: Adequate for Discharge   Problem: Activity: Goal: Risk for activity intolerance will decrease Outcome: Adequate for Discharge   Problem: Nutrition: Goal: Adequate nutrition will be maintained Outcome: Adequate for Discharge   Problem: Coping: Goal: Level of anxiety will decrease Outcome: Adequate for Discharge   Problem: Elimination: Goal: Will not experience complications related to bowel motility Outcome: Adequate for Discharge Goal: Will not experience complications related to urinary retention Outcome: Adequate for Discharge   Problem: Pain Managment: Goal: General experience of comfort will improve and/or be controlled Outcome: Adequate for Discharge   Problem: Safety: Goal: Ability to remain free from injury will improve Outcome: Adequate for Discharge   Problem: Skin Integrity: Goal: Risk for impaired skin integrity will decrease Outcome: Adequate for Discharge

## 2024-02-19 NOTE — TOC Transition Note (Signed)
 Transition of Care [facility]) - Discharge Note   Patient Details  Name: Janet Williamson MRN: [id number] Date of Birth: 05-17-1941  Transition of Care [facility]) CM/SW Contact:  Jeani Mill, RN Phone Number: 02/19/2024, 12:25 PM   Clinical Narrative:    Patient stable for discharge.  Daughter learned how to administer the iv antibiotics  Amy and Pam are aware.  Family to transport home.     Final next level of care: Home w Home Health Services Barriers to Discharge: Barriers Resolved   Patient Goals and CMS Choice Patient states their goals for this hospitalization and ongoing recovery are:: return home CMS Medicare.gov Compare Post Acute Care list provided to:: Patient Choice offered to / list presented to : Patient      Discharge Placement  Home                     Discharge Plan and Services Additional resources added to the After Visit Summary for     Discharge Planning Services: CM Consult Post Acute Care Choice: Home Health                    HH Arranged: RN, PT, OT [facility] Agency: [facility] Date [employer] Agency Contacted: 02/17/24 Time HH Agency Contacted: 1535 Representative spoke with at [facility] Agency: Amy  Social Drivers of Health (SDOH) Interventions SDOH Screenings   Food Insecurity: No Food Insecurity (02/14/2024)  Housing: Low Risk  (02/14/2024)  Transportation Needs: No Transportation Needs (02/14/2024)  Utilities: Not At Risk (02/14/2024)  Depression (PHQ2-9): Low Risk  (02/02/2021)  Social Connections: Unknown (02/14/2024)  Tobacco Use: Low Risk  (02/14/2024)     Readmission Risk Interventions    02/19/2024   12:25 PM  Readmission Risk Prevention Plan  Transportation Screening Complete  PCP or Specialist Appt within 5-7 Days Complete  Home Care Screening Complete  Medication Review (RN CM) Complete

## 2024-02-19 NOTE — Progress Notes (Signed)
 Rounded on pt for needle change. Found port dressing to not be intact and had 2nd tegaderm over the original tegaderm. Moderate blood found on and under biopatch. Deaccessed port and found bleeding source to be from insertion site. Cleaned site and applied gauze dressing. After 2 minutes bleeding stopped. Re-accessed port without difficulty. Educated primary rn about when to call IV team for soiled dressing and for needle change. Primary rn verbalized understanding.

## 2024-02-20 ENCOUNTER — Encounter: Payer: Self-pay | Admitting: Endocrinology

## 2024-02-20 ENCOUNTER — Telehealth: Payer: Self-pay | Admitting: Hematology

## 2024-02-20 DIAGNOSIS — A419 Sepsis, unspecified organism: Secondary | ICD-10-CM | POA: Diagnosis not present

## 2024-02-20 DIAGNOSIS — Z8673 Personal history of transient ischemic attack (TIA), and cerebral infarction without residual deficits: Secondary | ICD-10-CM | POA: Diagnosis not present

## 2024-02-20 DIAGNOSIS — I5033 Acute on chronic diastolic (congestive) heart failure: Secondary | ICD-10-CM | POA: Diagnosis not present

## 2024-02-20 DIAGNOSIS — E1165 Type 2 diabetes mellitus with hyperglycemia: Secondary | ICD-10-CM | POA: Diagnosis not present

## 2024-02-20 DIAGNOSIS — C250 Malignant neoplasm of head of pancreas: Secondary | ICD-10-CM | POA: Diagnosis not present

## 2024-02-20 DIAGNOSIS — I110 Hypertensive heart disease with heart failure: Secondary | ICD-10-CM | POA: Diagnosis not present

## 2024-02-20 DIAGNOSIS — I21A1 Myocardial infarction type 2: Secondary | ICD-10-CM | POA: Diagnosis not present

## 2024-02-20 DIAGNOSIS — I480 Paroxysmal atrial fibrillation: Secondary | ICD-10-CM | POA: Diagnosis not present

## 2024-02-20 DIAGNOSIS — I38 Endocarditis, valve unspecified: Secondary | ICD-10-CM | POA: Diagnosis not present

## 2024-02-20 DIAGNOSIS — J9601 Acute respiratory failure with hypoxia: Secondary | ICD-10-CM | POA: Diagnosis not present

## 2024-02-20 DIAGNOSIS — J189 Pneumonia, unspecified organism: Secondary | ICD-10-CM | POA: Diagnosis not present

## 2024-02-20 LAB — CULTURE, BLOOD (ROUTINE X 2)
Culture: NO GROWTH
Culture: NO GROWTH
Special Requests: ADEQUATE
Special Requests: ADEQUATE

## 2024-02-24 DIAGNOSIS — E1165 Type 2 diabetes mellitus with hyperglycemia: Secondary | ICD-10-CM | POA: Diagnosis not present

## 2024-02-24 DIAGNOSIS — T82857D Stenosis of cardiac prosthetic devices, implants and grafts, subsequent encounter: Secondary | ICD-10-CM | POA: Diagnosis not present

## 2024-02-24 DIAGNOSIS — I480 Paroxysmal atrial fibrillation: Secondary | ICD-10-CM | POA: Diagnosis not present

## 2024-02-24 DIAGNOSIS — J9601 Acute respiratory failure with hypoxia: Secondary | ICD-10-CM | POA: Diagnosis not present

## 2024-02-24 DIAGNOSIS — I339 Acute and subacute endocarditis, unspecified: Secondary | ICD-10-CM | POA: Diagnosis not present

## 2024-02-24 DIAGNOSIS — Z95828 Presence of other vascular implants and grafts: Secondary | ICD-10-CM | POA: Diagnosis not present

## 2024-02-24 DIAGNOSIS — E119 Type 2 diabetes mellitus without complications: Secondary | ICD-10-CM | POA: Diagnosis not present

## 2024-02-24 DIAGNOSIS — I21A1 Myocardial infarction type 2: Secondary | ICD-10-CM | POA: Diagnosis not present

## 2024-02-24 DIAGNOSIS — T82857A Stenosis of cardiac prosthetic devices, implants and grafts, initial encounter: Secondary | ICD-10-CM | POA: Diagnosis not present

## 2024-02-24 DIAGNOSIS — I110 Hypertensive heart disease with heart failure: Secondary | ICD-10-CM | POA: Diagnosis not present

## 2024-02-24 DIAGNOSIS — I5033 Acute on chronic diastolic (congestive) heart failure: Secondary | ICD-10-CM | POA: Diagnosis not present

## 2024-02-24 DIAGNOSIS — J189 Pneumonia, unspecified organism: Secondary | ICD-10-CM | POA: Diagnosis not present

## 2024-02-24 DIAGNOSIS — J810 Acute pulmonary edema: Secondary | ICD-10-CM | POA: Diagnosis not present

## 2024-02-24 DIAGNOSIS — A419 Sepsis, unspecified organism: Secondary | ICD-10-CM | POA: Diagnosis not present

## 2024-02-24 DIAGNOSIS — Z8673 Personal history of transient ischemic attack (TIA), and cerebral infarction without residual deficits: Secondary | ICD-10-CM | POA: Diagnosis not present

## 2024-02-26 ENCOUNTER — Telehealth: Payer: Self-pay

## 2024-02-26 DIAGNOSIS — I21A1 Myocardial infarction type 2: Secondary | ICD-10-CM | POA: Diagnosis not present

## 2024-02-26 DIAGNOSIS — J189 Pneumonia, unspecified organism: Secondary | ICD-10-CM | POA: Diagnosis not present

## 2024-02-26 DIAGNOSIS — I5033 Acute on chronic diastolic (congestive) heart failure: Secondary | ICD-10-CM | POA: Diagnosis not present

## 2024-02-26 DIAGNOSIS — J9601 Acute respiratory failure with hypoxia: Secondary | ICD-10-CM | POA: Diagnosis not present

## 2024-02-26 DIAGNOSIS — J810 Acute pulmonary edema: Secondary | ICD-10-CM | POA: Diagnosis not present

## 2024-02-26 DIAGNOSIS — I339 Acute and subacute endocarditis, unspecified: Secondary | ICD-10-CM | POA: Diagnosis not present

## 2024-02-26 DIAGNOSIS — I110 Hypertensive heart disease with heart failure: Secondary | ICD-10-CM | POA: Diagnosis not present

## 2024-02-26 DIAGNOSIS — E1165 Type 2 diabetes mellitus with hyperglycemia: Secondary | ICD-10-CM | POA: Diagnosis not present

## 2024-02-26 DIAGNOSIS — Z8673 Personal history of transient ischemic attack (TIA), and cerebral infarction without residual deficits: Secondary | ICD-10-CM | POA: Diagnosis not present

## 2024-02-26 DIAGNOSIS — T82857A Stenosis of cardiac prosthetic devices, implants and grafts, initial encounter: Secondary | ICD-10-CM | POA: Diagnosis not present

## 2024-02-26 DIAGNOSIS — T82857D Stenosis of cardiac prosthetic devices, implants and grafts, subsequent encounter: Secondary | ICD-10-CM | POA: Diagnosis not present

## 2024-02-26 DIAGNOSIS — I480 Paroxysmal atrial fibrillation: Secondary | ICD-10-CM | POA: Diagnosis not present

## 2024-02-26 DIAGNOSIS — A419 Sepsis, unspecified organism: Secondary | ICD-10-CM | POA: Diagnosis not present

## 2024-02-26 DIAGNOSIS — Z95828 Presence of other vascular implants and grafts: Secondary | ICD-10-CM | POA: Diagnosis not present

## 2024-02-26 DIAGNOSIS — E119 Type 2 diabetes mellitus without complications: Secondary | ICD-10-CM | POA: Diagnosis not present

## 2024-02-26 LAB — LAB REPORT - SCANNED
Creatinine, POC: 0.76 mg/dL
EGFR: 78

## 2024-02-26 NOTE — Telephone Encounter (Signed)
 Yea, let's draw a stat CK level to assess and hold until we get those labs back. Please start/continue ceftriaxone .   I have labs from 5/19 and they didn't even draw a CK at that time. So, when you talk to them, can you please remind them to do a weekly CK with her labs. Thanks Cece!

## 2024-02-26 NOTE — Telephone Encounter (Signed)
 We can call her this morning if you need us  to

## 2024-02-26 NOTE — Telephone Encounter (Signed)
 Updated Pam with [employer] with plan.

## 2024-02-26 NOTE — Telephone Encounter (Signed)
 Received the following message from [employer];  Hey team.  I had a call last PM from Janet Williamson the granddaughter for [name]. Janet Williamson is an [employer] and came by to see her grandmother when she noticed the pt had 6 Ceftriaxone  syringes and NO Daptomycin  syringes in the fridge. We had send equal amounts of each.  After she investigated with the daughter Janet Williamson who has been administering, we discovered she had been using 2 Dapto doses/syringes daily for the last several days.   We have the Dapto on hold at present.  We can have RN draw CK as we would assume needed but please advise.  Thanks.    Routing to provider and pharmacy team.  Julien Odor, RMA

## 2024-02-26 NOTE — Telephone Encounter (Signed)
 Sounds good. Thank you

## 2024-02-27 ENCOUNTER — Telehealth: Payer: Self-pay | Admitting: Pharmacist

## 2024-02-27 NOTE — Telephone Encounter (Signed)
 Janet Parson, RN with [employer] called to follow up. Informed her that pharmacist student Twila Gale reached out to [facility] agency with orders to start ceftriaxone  today and continue with daptomycin  to complete antibiotic course.    [name] [name], CMA

## 2024-02-27 NOTE — Telephone Encounter (Signed)
 Called Janet Williamson this morning to discuss her labwork from yesterday. She was being administered double the dose of daptomycin  and no ceftriaxone  by her daughter at home. She is not currently having any symptoms of kidney dysfunction or elevated CK (myalgias, etc). Reports that her urine output has remained normal. Discussed that her kidney function and CK on the labs drawn yesterday were normal.   Also called Cecelia at the home health agency to discuss how to move forward. Explained that we will just start ceftriaxone  today and continue with daptomycin  to complete the antibiotic course. Cecelia informed us  that the daptomycin  was supplied in a premixed bag which was being bolused each day. Since the medication was being given twice as often, the patient does not have any daptomycin  left. This will be managed by [employer], provided her with the phone number to call to discuss this.   Valarie Garner, PharmD PGY1 Pharmacy Resident

## 2024-02-27 NOTE — Telephone Encounter (Signed)
 Got it - thank you1

## 2024-02-28 ENCOUNTER — Other Ambulatory Visit: Payer: Self-pay | Admitting: Hematology

## 2024-02-28 DIAGNOSIS — J189 Pneumonia, unspecified organism: Secondary | ICD-10-CM | POA: Diagnosis not present

## 2024-02-28 DIAGNOSIS — I38 Endocarditis, valve unspecified: Secondary | ICD-10-CM | POA: Diagnosis not present

## 2024-02-28 DIAGNOSIS — E1165 Type 2 diabetes mellitus with hyperglycemia: Secondary | ICD-10-CM | POA: Diagnosis not present

## 2024-02-28 DIAGNOSIS — Z8673 Personal history of transient ischemic attack (TIA), and cerebral infarction without residual deficits: Secondary | ICD-10-CM | POA: Diagnosis not present

## 2024-02-28 DIAGNOSIS — J9601 Acute respiratory failure with hypoxia: Secondary | ICD-10-CM | POA: Diagnosis not present

## 2024-02-28 DIAGNOSIS — I10 Essential (primary) hypertension: Secondary | ICD-10-CM | POA: Diagnosis not present

## 2024-02-28 DIAGNOSIS — A419 Sepsis, unspecified organism: Secondary | ICD-10-CM | POA: Diagnosis not present

## 2024-02-28 DIAGNOSIS — C787 Secondary malignant neoplasm of liver and intrahepatic bile duct: Secondary | ICD-10-CM | POA: Diagnosis not present

## 2024-02-28 DIAGNOSIS — E1169 Type 2 diabetes mellitus with other specified complication: Secondary | ICD-10-CM | POA: Diagnosis not present

## 2024-02-28 DIAGNOSIS — M545 Low back pain, unspecified: Secondary | ICD-10-CM | POA: Diagnosis not present

## 2024-02-28 DIAGNOSIS — Z5111 Encounter for antineoplastic chemotherapy: Secondary | ICD-10-CM | POA: Diagnosis not present

## 2024-02-28 DIAGNOSIS — C250 Malignant neoplasm of head of pancreas: Secondary | ICD-10-CM | POA: Diagnosis not present

## 2024-02-28 DIAGNOSIS — I21A1 Myocardial infarction type 2: Secondary | ICD-10-CM | POA: Diagnosis not present

## 2024-02-28 DIAGNOSIS — I110 Hypertensive heart disease with heart failure: Secondary | ICD-10-CM | POA: Diagnosis not present

## 2024-02-28 DIAGNOSIS — I5033 Acute on chronic diastolic (congestive) heart failure: Secondary | ICD-10-CM | POA: Diagnosis not present

## 2024-02-28 DIAGNOSIS — I480 Paroxysmal atrial fibrillation: Secondary | ICD-10-CM | POA: Diagnosis not present

## 2024-02-28 DIAGNOSIS — I350 Nonrheumatic aortic (valve) stenosis: Secondary | ICD-10-CM | POA: Diagnosis not present

## 2024-03-03 ENCOUNTER — Encounter: Payer: Self-pay | Admitting: Hematology

## 2024-03-03 ENCOUNTER — Other Ambulatory Visit: Payer: Self-pay

## 2024-03-03 DIAGNOSIS — E1169 Type 2 diabetes mellitus with other specified complication: Secondary | ICD-10-CM | POA: Diagnosis not present

## 2024-03-03 DIAGNOSIS — I10 Essential (primary) hypertension: Secondary | ICD-10-CM | POA: Diagnosis not present

## 2024-03-03 DIAGNOSIS — I110 Hypertensive heart disease with heart failure: Secondary | ICD-10-CM | POA: Diagnosis not present

## 2024-03-03 DIAGNOSIS — E1165 Type 2 diabetes mellitus with hyperglycemia: Secondary | ICD-10-CM | POA: Diagnosis not present

## 2024-03-03 DIAGNOSIS — J9601 Acute respiratory failure with hypoxia: Secondary | ICD-10-CM | POA: Diagnosis not present

## 2024-03-03 DIAGNOSIS — I5033 Acute on chronic diastolic (congestive) heart failure: Secondary | ICD-10-CM | POA: Diagnosis not present

## 2024-03-03 DIAGNOSIS — C250 Malignant neoplasm of head of pancreas: Secondary | ICD-10-CM

## 2024-03-03 DIAGNOSIS — A419 Sepsis, unspecified organism: Secondary | ICD-10-CM | POA: Diagnosis not present

## 2024-03-03 DIAGNOSIS — J189 Pneumonia, unspecified organism: Secondary | ICD-10-CM | POA: Diagnosis not present

## 2024-03-03 DIAGNOSIS — I38 Endocarditis, valve unspecified: Secondary | ICD-10-CM | POA: Diagnosis not present

## 2024-03-03 DIAGNOSIS — I350 Nonrheumatic aortic (valve) stenosis: Secondary | ICD-10-CM | POA: Diagnosis not present

## 2024-03-03 NOTE — Assessment & Plan Note (Signed)
 T3N0 by EUS, questionable peritoneal carcinomatosis -She had an episode of abdominal pain, nausea, and dizziness at church and came to ED 08/18/2023 -CT AP done 10/08/2023 showing a stable solid right lower lobe pulmonary nodule from 2019 and an ill-defined hypodense lesion in the pancreatic head measuring 4.4 x 3.9 x 3.3 cm obstructing the pancreatic and CBD appearing to encase and occlude the SMV and portal confluence with soft tissue stranding extending along the mesenteric root with nodular foci peritoneal soft tissue nodularity anterior to the mass; overall concerning for primary pancreatic malignancy with peritoneal carcinomatosis.    -EUS 10/16/23 by Dr. Kimble Pennant showing a T3 N0 pancreatic head mass invading the SMA and ERCP with stenting by Dr. Lavaughn Portland, cytology confirming adenocarcinoma.  -Due to her age, the vascular invasion/abutment of the SMA, and possible peritoneal involvement, she is likely not a surgical candidate but Dr. Cherlynn Cornfield has reviewed her case and agreed to see the pt for surgical discussion. She was seen on 11/26/2023 and felt to be a poor candidate for surgery  -PET scan showed no definite evidence of metastatic disease.  The questionable peritoneal involvement on CT is felt to be local extension -plan to start chemo gemcitabine  alone, and may add Abraxane  from second cycle if tolerates well. She started chemo on 11/13/2023, she did not tolerate gem/Abraxane , and chemo changed back to gemcitabine  alone  -restaging CT 4/23 showed sable pancreatic mass but a new 1.1cm liver lesion which is suspicious for new met, which also appears to be suspicious on liver MRI in early May -Pt was hospitalized for acute endicarditis on 02/12/2024 and she will continue antibiotics for 6 weeks

## 2024-03-04 ENCOUNTER — Inpatient Hospital Stay

## 2024-03-04 ENCOUNTER — Inpatient Hospital Stay (HOSPITAL_BASED_OUTPATIENT_CLINIC_OR_DEPARTMENT_OTHER): Admitting: Hematology

## 2024-03-04 VITALS — BP 140/60 | HR 65 | Temp 98.1°F | Resp 18 | Ht 63.0 in | Wt 146.3 lb

## 2024-03-04 DIAGNOSIS — M545 Low back pain, unspecified: Secondary | ICD-10-CM | POA: Diagnosis not present

## 2024-03-04 DIAGNOSIS — E1165 Type 2 diabetes mellitus with hyperglycemia: Secondary | ICD-10-CM | POA: Diagnosis not present

## 2024-03-04 DIAGNOSIS — I350 Nonrheumatic aortic (valve) stenosis: Secondary | ICD-10-CM | POA: Diagnosis not present

## 2024-03-04 DIAGNOSIS — C250 Malignant neoplasm of head of pancreas: Secondary | ICD-10-CM

## 2024-03-04 DIAGNOSIS — C787 Secondary malignant neoplasm of liver and intrahepatic bile duct: Secondary | ICD-10-CM | POA: Diagnosis not present

## 2024-03-04 DIAGNOSIS — Z95828 Presence of other vascular implants and grafts: Secondary | ICD-10-CM

## 2024-03-04 DIAGNOSIS — Z5111 Encounter for antineoplastic chemotherapy: Secondary | ICD-10-CM | POA: Diagnosis not present

## 2024-03-04 DIAGNOSIS — E1169 Type 2 diabetes mellitus with other specified complication: Secondary | ICD-10-CM | POA: Diagnosis not present

## 2024-03-04 DIAGNOSIS — I10 Essential (primary) hypertension: Secondary | ICD-10-CM | POA: Diagnosis not present

## 2024-03-04 LAB — CMP (CANCER CENTER ONLY)
ALT: 8 U/L (ref 0–44)
AST: 15 U/L (ref 15–41)
Albumin: 3.6 g/dL (ref 3.5–5.0)
Alkaline Phosphatase: 55 U/L (ref 38–126)
Anion gap: 6 (ref 5–15)
BUN: 10 mg/dL (ref 8–23)
CO2: 31 mmol/L (ref 22–32)
Calcium: 9.8 mg/dL (ref 8.9–10.3)
Chloride: 99 mmol/L (ref 98–111)
Creatinine: 0.63 mg/dL (ref 0.44–1.00)
GFR, Estimated: >60 mL/min (ref >60)
Glucose, Bld: 166 mg/dL — ABNORMAL HIGH (ref 70–99)
Potassium: 3.8 mmol/L (ref 3.5–5.1)
Sodium: 136 mmol/L (ref 135–145)
Total Bilirubin: 0.3 mg/dL (ref 0.0–1.2)
Total Protein: 7.7 g/dL (ref 6.5–8.1)

## 2024-03-04 LAB — CBC WITH DIFFERENTIAL (CANCER CENTER ONLY)
Abs Immature Granulocytes: 0.03 K/uL (ref 0.00–0.07)
Basophils Absolute: 0.1 K/uL (ref 0.0–0.1)
Basophils Relative: 1 %
Eosinophils Absolute: 0.2 K/uL (ref 0.0–0.5)
Eosinophils Relative: 2 %
HCT: 30.9 % — ABNORMAL LOW (ref 36.0–46.0)
Hemoglobin: 9.7 g/dL — ABNORMAL LOW (ref 12.0–15.0)
Immature Granulocytes: 0 %
Lymphocytes Relative: 24 %
Lymphs Abs: 2.1 K/uL (ref 0.7–4.0)
MCH: 24.1 pg — ABNORMAL LOW (ref 26.0–34.0)
MCHC: 31.4 g/dL (ref 30.0–36.0)
MCV: 76.9 fL — ABNORMAL LOW (ref 80.0–100.0)
Monocytes Absolute: 0.6 K/uL (ref 0.1–1.0)
Monocytes Relative: 7 %
Neutro Abs: 6 K/uL (ref 1.7–7.7)
Neutrophils Relative %: 66 %
Platelet Count: 522 K/uL — ABNORMAL HIGH (ref 150–400)
RBC: 4.02 MIL/uL (ref 3.87–5.11)
RDW: 18.2 % — ABNORMAL HIGH (ref 11.5–15.5)
WBC Count: 9 K/uL (ref 4.0–10.5)
nRBC: 0 % (ref 0.0–0.2)

## 2024-03-04 MED ORDER — SODIUM CHLORIDE 0.9% FLUSH
10.0000 mL | Freq: Once | INTRAVENOUS | Status: AC
  Administered 2024-03-04: 10 mL

## 2024-03-04 MED ORDER — HEPARIN SOD (PORK) LOCK FLUSH 100 UNIT/ML IV SOLN
500.0000 [IU] | Freq: Once | INTRAVENOUS | Status: AC
  Administered 2024-03-04: 500 [IU]

## 2024-03-04 MED ORDER — ACETAMINOPHEN-CODEINE 300-30 MG PO TABS: 1.0000 | ORAL_TABLET | Freq: Three times a day (TID) | ORAL | 0 refills | Status: DC | PRN

## 2024-03-04 NOTE — Progress Notes (Signed)
 Patient here for port flush and labs. Port was already accessed by home health nurse due to getting daily antibiotics. Port did not have a secured dressing because misunderstood how to use numbing cream and removed dressing to put on cream with already accessed port; and no bio patch. Home health nurse comes out weekly on Monday's and patient needed port accessed today. Providers nurse Abbie Abbey, RN and charge nurse Landa Pine, RN notified. Patient and family were instructed on how to use cream and Port site was assessed by Char[employer] fundraiser. Port reaccessed and Biopatch and SorbaView dressing put on, all connections and caps secured.

## 2024-03-04 NOTE — Progress Notes (Signed)
 " Cardiology Office Note    Patient Name: Janet Williamson Date of Encounter: 03/06/2024  Primary Care Provider:  Leonel Cole, MD Primary Cardiologist:  Oneil Parchment, MD Primary Electrophysiologist: None   Past Medical History    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 Not seen on most recent TEE 2019.   Cardiomyopathy in other disease    Cataracts, bilateral    Diabetes mellitus    Type 2   Generalized osteoarthritis    GERD (gastroesophageal reflux disease)    HLD (hyperlipidemia)    Hypertension    Hypothyroidism    LBBB (left bundle branch block)    PAF (paroxysmal atrial fibrillation) (HCC)    a. s/p DCCV 06/2017, on Xarelto    Phlebitis    S/P TAVR (transcatheter aortic valve replacement) 04/15/2018   Edwards Sapien 3 THV (size 23 mm, model # 9600TFX, serial # [id number]) via the TF approach   Severe aortic stenosis    a. s/p TAVR 04/2018.   Stroke [facility]) 2008    History of Present Illness  Janet Williamson is a 83 y.o. female with a PMH of embolic CVA 2008, nonrheumatic AS s/p TAVR 2019, mitral valve mass, PAF (on Xarelto ), LBBB, HTN, HLD, hypothyroidism, GERD, DM type II, pancreatic CA on chemotherapy who presents today for posthospital follow-up.  Janet Williamson followed by Dr. Parchment for management of valvular and heart disease.  She has a history of embolic CVA in 2008 due to mitral valve mass of 1.3 cm and an LHC that showed no significant coronary artery disease in 2011.  She was evaluated by Dr. 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  She had a TTE completed in 2014 that showed moderate aortic stenosis.  She was seen in 2019 with complaint of worsening shortness of breath and underwent a TTE that showed severe aortic stenosis with mean transvalvular gradient of 47 mmHg.  She underwent R/LHC by  Dr. Claudene that showed valve gradients of 32 and 22 mmHg.  She was referred to Dr. Wonda for consideration of TAVR which was completed on 04/2018.  She was seen by Dr. Parchment on 08/25/2018 with complaint of dizziness and wore an event monitor.  She was seen in the ED on 09/29/2018 with progressive shortness of breath.  She was found to have a GI bleed requiring 3 units of PRBC per colonoscopy.  EGD was also completed showing erosive gastropathy and capsule endoscopy revealed duodenal ulcer.  She had ASA discontinued.  She was found not to be a good candidate for Watchman and Xarelto  was not restarted.  She experienced abdominal pain and followed up with PCP who ordered a CT on 10/08/2023 that showed stable She underwent EUS 10/16/23 by Dr. Burnette showing a T3 N0 pancreatic head mass invading the SMA and ERCP with stenting by Dr. Rosalie, cytology confirming adenocarcinoma. solid right lower lobe nodule and hypodense lesion in the pancreatic head.  She started chemotherapy on 11/13/2023 and was seen in the ED on 02/09/24 with complaint of shortness of breath and palpitations.  Patient was treated with Cardizem  and started on Xarelto .  She noted constant palpitations over the previous 2 to 3 days.  She was transferred via EMS and was hypoxic on room air with sats in the 80s.  She had initial troponins obtained that were  elevated as well as BNP.  She was placed on BiPAP permitted to breathing.  She received a dose of IV Lasix  and found to have sepsis in the setting of pneumonia and pulmonary edema.  50-60% with moderate LVH moderate to severe MR moderate-severe prosthetic AR aortic valve vegetation noted with negative blood cultures. She was deemed not a candidate for further interventions at that time.  He was discharged with daptomycin  and ceftriaxone .  She had no acute bleeding episodes or GI concerns with Xarelto .  Janet Williamson presents today with her family for posthospital follow-up.Since discharge, she has experienced  no issues with swelling or shortness of breath and is now able to lie flat without difficulty. She is still completing her course of antibiotics, expected to finish by March 25, 2024. She has resumed taking Xarelto  without any bleeding complications and continues on her blood pressure medications, including Lopressor  and Lasix  20 mg daily. Her kidney function has remained stable post-discharge.  She reports ongoing lower back pain that worsens with walking or standing and persists throughout the day. This pain has been present before but is now lingering longer than usual. She has tried Tylenol , which only causes drowsiness, and a heating pad without relief. She has not found relief with pain patches either. The pain is localized to the lower back and does not radiate to the legs, although her legs feel weaker since the onset of the back pain. She remains active, moving around regularly. She has a history of heart issues, and during her recent hospital stay, her heart function was assessed. However, a vegetation was noted on one of the aortic valves. She is not currently undergoing chemotherapy treatments, which have been stopped. She is receiving home infusions and continues with physical therapy, although she was unable to participate fully in a session recently due to not feeling well. No new cardiac complaints or side effects from her medications. No issues with urination and no dizziness. No bleeding issues while on Xarelto . Her blood pressure readings at home are consistent with those taken during the visit.  Patient denies chest pain, palpitations, dyspnea, PND, orthopnea, nausea, vomiting, dizziness, syncope, edema, weight gain, or early satiety.  Discussed the use of AI scribe software for clinical note transcription with the patient, who gave verbal consent to proceed.  History of Present Illness    Review of Systems  Please see the history of present illness.    All other systems reviewed and  are otherwise negative except as noted above.  Physical Exam     Wt Readings from Last 3 Encounters:  03/06/24 145 lb (65.8 kg)  03/04/24 146 lb 4.8 oz (66.4 kg)  02/19/24 151 lb 7.3 oz (68.7 kg)   VS: Vitals:   03/06/24 0835  BP: (!) 116/50  Pulse: 76  Resp: (!) 96  ,Body mass index is 25.69 kg/m. GEN: Well nourished, well developed in no acute distress Neck: No JVD; No carotid bruits Pulmonary: Clear to auscultation without rales, wheezing or rhonchi  Cardiovascular: Normal rate. Regular rhythm. Normal S1. Normal S2.   Murmurs: There is no murmur.  ABDOMEN: Soft, non-tender, non-distended EXTREMITIES:  No edema; No deformity   EKG/LABS/ Recent Cardiac Studies   ECG personally reviewed by me today -none completed today  Risk Assessment/Calculations:    CHA2DS2-VASc Score = 9   This indicates a 12.2% annual risk of stroke. The patient's score is based upon: CHF History: 1 HTN History: 1 Diabetes History: 1 Stroke History: 2 Vascular Disease  History: 1 Age Score: 2 Gender Score: 1         Lab Results  Component Value Date   WBC 9.0 03/04/2024   HGB 9.7 (L) 03/04/2024   HCT 30.9 (L) 03/04/2024   MCV 76.9 (L) 03/04/2024   PLT 522 (H) 03/04/2024   Lab Results  Component Value Date   CREATININE 0.63 03/04/2024   BUN 10 03/04/2024   NA 136 03/04/2024   K 3.8 03/04/2024   CL 99 03/04/2024   CO2 31 03/04/2024   Lab Results  Component Value Date   CHOL 137 01/07/2024   HDL 42 (L) 01/07/2024   LDLCALC 78 01/07/2024   TRIG 91 01/07/2024   CHOLHDL 3.3 01/07/2024    Lab Results  Component Value Date   HGBA1C 7.1 (A) 01/13/2024   Assessment & Plan    Assessment and Plan Assessment & Plan Heart failure Heart failure exacerbation managed in the emergency room. Currently stable with no new edema or dyspnea. Renal function stable on current diuretic regimen. - Continue Lasix  20 mg daily. - Instruct to take an additional 20 mg of Lasix  if dyspnea or  weight gain occurs. - Encourage daily weight monitoring. - Maintain hydration with at least 64 ounces of fluid daily. - Continue metoprolol  and Xarelto  as prescribed.  Aortic valve vegetation Vegetation on aortic valve noted. Cardiologist opted for monitoring due to lack of symptoms. Potential for progression to stenosis if symptoms develop. - Continue current management without intervention unless symptoms develop.  Pneumonia Recent pneumonia treated with antibiotics and IV Lasix . Lungs improving with no fluid detected. - Complete antibiotic course by June 18. - Follow-up chest x-ray with primary care to ensure pneumonia resolution.  Back pain Chronic lower back pain exacerbated, possibly due to arthritic changes. Persistent pain not relieved by acetaminophen  or heating pad. No radicular pain, but leg weakness noted. - Continue using heating pad and consider topical analgesics. - Follow up with primary care for further evaluation and management. - Consider referral for imaging if no improvement.  Leg weakness Leg weakness noted since onset of back pain. No radicular pain. Active with physical therapy. - Continue physical therapy to maintain leg strength. - Encourage regular movement to prevent muscle stiffness.    1.  HFpEF: -Heart failure exacerbation managed in the emergency room. Currently stable with no new edema or dyspnea. Renal function stable on current diuretic regimen. - Continue Lasix  20 mg daily. - Instruct to take an additional 20 mg of Lasix  if dyspnea or weight gain occurs. - Encourage daily weight monitoring. - Maintain hydration with at least 64 ounces of fluid daily. - Continue metoprolol  and Xarelto  as prescribed.  2.  Nonrheumatic AVR: -Vegetation on aortic valve noted. Cardiologist opted for monitoring due to lack of symptoms. Potential for progression to stenosis if symptoms develop. - Continue current management without intervention unless symptoms  develop.  3.  Paroxysmal AF: - Patient is rate controlled today at 72 bpm - Patient reports no bleeding on Xarelto  - Continue Xarelto  20 mg daily and metoprolol  25 mg twice daily  4.  Malignant neoplasm of the pancreas: - Continue current treatment plan per oncology  5.  History of CVA: -s/p embolic CVA in 2008 due to mitral valve mass of 1.3 cm  - Continue good BP control and Xarelto  20 mg daily  6.  Essential hypertension: - Patient's blood pressure today was 116/50 and was 124/58 on recheck -Continue Norvasc  5 mg daily and metoprolol  25 mg twice daily  Disposition: Follow-up with Oneil Parchment, MD or APP in 6 months    Signed, Wyn Raddle, Jackee Shove, NP 03/06/2024, 9:51 AM [facility] "

## 2024-03-04 NOTE — Progress Notes (Signed)
 [facility]   [phone] Fax:([phone]   Clinic Follow up Note   Patient Care Team: Benedetto Brady, MD as PCP - General (Family Medicine) Hugh Madura, MD as PCP - Cardiology (Cardiology) Sonja Willoughby, MD as Consulting Physician (Hematology) Evangeline Hilts, MD as Consulting Physician (Gastroenterology) Ozell Blunt, MD as Consulting Physician (Gastroenterology)  Date of Service:  03/04/2024  CHIEF COMPLAINT: f/u of pancreatic cancer  CURRENT THERAPY:  Supportive care  Oncology History   Malignant neoplasm of head of pancreas (HCC) T3N0 by EUS, questionable peritoneal carcinomatosis -She had an episode of abdominal pain, nausea, and dizziness at church and came to ED 08/18/2023 -CT AP done 10/08/2023 showing a stable solid right lower lobe pulmonary nodule from 2019 and an ill-defined hypodense lesion in the pancreatic head measuring 4.4 x 3.9 x 3.3 cm obstructing the pancreatic and CBD appearing to encase and occlude the SMV and portal confluence with soft tissue stranding extending along the mesenteric root with nodular foci peritoneal soft tissue nodularity anterior to the mass; overall concerning for primary pancreatic malignancy with peritoneal carcinomatosis.    -EUS 10/16/23 by Dr. Kimble Pennant showing a T3 N0 pancreatic head mass invading the SMA and ERCP with stenting by Dr. Lavaughn Portland, cytology confirming adenocarcinoma.  -Due to her age, the vascular invasion/abutment of the SMA, and possible peritoneal involvement, she is likely not a surgical candidate but Dr. Cherlynn Cornfield has reviewed her case and agreed to see the pt for surgical discussion. She was seen on 11/26/2023 and felt to be a poor candidate for surgery  -PET scan showed no definite evidence of metastatic disease.  The questionable peritoneal involvement on CT is felt to be local extension -plan to start chemo gemcitabine  alone, and may add Abraxane  from second cycle if tolerates well. She started chemo on  11/13/2023, she did not tolerate gem/Abraxane , and chemo changed back to gemcitabine  alone  -restaging CT 4/23 showed sable pancreatic mass but a new 1.1cm liver lesion which is suspicious for new met, which also appears to be suspicious on liver MRI in early May -Pt was hospitalized for acute endicarditis on 02/12/2024 and she will continue antibiotics for 6 weeks  Assessment & Plan Pancreatic cancer with liver metastasis Progression of pancreatic cancer with new 1 cm liver metastasis, indicating advancement despite previous chemotherapy. Chemotherapy is not recommended due to age, current heart valve infection, and prior adverse effects. Anticipated progression over the next few months may increase symptoms. Hospice care is advised for symptom management as needed. - Monitor symptoms and consider hospice referral with symptom progression - Provide supportive care and manage cancer-related symptoms as she arises - Prescribe Tylenol  #3 for pain management as needed  Acute endocarditis Currently receiving antibiotics for heart valve infection, likely related to chemotherapy-induced immunosuppression. Chemotherapy is contraindicated due to infection risk. - Continue antibiotics until March 25, 2024 - Re-evaluate infection status post-antibiotic course  Osteoarthritis Chronic lower back pain attributed to osteoarthritis, managed with Tylenol  as needed. - Continue Tylenol  for pain management as needed  Goals of Care, ACP and DNR  She has a DNR order and prefers limited medical intervention without life support or chest compressions. She is comfortable with her disease progression, supported by her family, and will consider hospice care with symptom progression. - Document DNR status and limited intervention preference in the chart - Discuss hospice care options with symptom progression  Plan - Due to her recent endocarditis, poor response to chemotherapy, and cancer progression, I do not recommend  more chemotherapy.  Both patient and her family are in agreement. - She will continue antibiotics until mid June - Lab and follow-up in a month.  When patient developed a more cancer related symptoms, will refer her to hospice. - I completed advance care plan today, patient agrees with DNR.  I documented.  SUMMARY OF ONCOLOGIC HISTORY: Oncology History  Malignant neoplasm of head of pancreas (HCC)  10/16/2023 Cancer Staging   Staging form: Exocrine Pancreas, AJCC 8th Edition - Clinical stage from 10/16/2023: Stage IIA (cT3, cN0, cM0) - Signed by Sonja Crawford, MD on 11/20/2023 Total positive nodes: 0   10/24/2023 Initial Diagnosis   Malignant neoplasm of head of pancreas (HCC)   11/13/2023 -  Chemotherapy   Patient is on Treatment Plan : PANCREATIC Abraxane  D1,8,15 + Gemcitabine  D1,8,15 q28d     12/04/2023 Genetic Testing   Negative genetic testing on the CancerNext+RNA panel.  BRCA1  p.D695N (c.2083G>A)  VUS identified.  The report date is 12/03/2023.  The [employer] CancerNext+RNAinsight Panel includes sequencing, rearrangement analysis, and RNA analysis for the following 39 genes: APC, ATM, BAP1, BARD1, BMPR1A, BRCA1, BRCA2, BRIP1, CDH1, CDKN2A, CHEK2, FH, FLCN, MET, MLH1, MSH2, MSH6, MUTYH, NF1, NTHL1, PALB2, PMS2, PTEN, RAD51C, RAD51D, SMAD4, STK11, TP53, TSC1, TSC2, and VHL (sequencing and deletion/duplication); AXIN2, HOXB13, MBD4, MSH3, POLD1 and POLE (sequencing only); EPCAM and GREM1 (deletion/duplication only).       Discussed the use of AI scribe software for clinical note transcription with the patient, who gave verbal consent to proceed.  History of Present Illness Janet Williamson is an 83 year old female with pancreatic cancer who presents for follow-up. She is accompanied by her family, who are involved in her care decisions.  A new 1 cm spot in the liver was identified on a liver MRI on Feb 10, 2024, which was not present in previous imaging. She is experiencing lower back pain in the  tailbone area, typically managed with Tylenol , but has not taken any today. She is currently on antibiotics for an infection in her heart valve, which developed during chemotherapy. Previously, she tried hydrocodone for cancer-related stomach pain but experienced nausea and fatigue. She currently has no stomach pain. She lives at home with 24-hour supervision, which her family is considering reducing.     All other systems were reviewed with the patient and are negative.  MEDICAL HISTORY:  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 Not seen on most recent TEE 2019.   Cardiomyopathy in other disease    Cataracts, bilateral    Diabetes mellitus    Type 2   Generalized osteoarthritis    GERD (gastroesophageal reflux disease)    HLD (hyperlipidemia)    Hypertension    Hypothyroidism    LBBB (left bundle branch block)    PAF (paroxysmal atrial fibrillation) (HCC)    a. s/p DCCV 06/2017, on Xarelto    Phlebitis    S/P TAVR (transcatheter aortic valve replacement) 04/15/2018   Edwards Sapien 3 THV (size 23 mm, model # 9600TFX, serial # [id number]) via the TF approach   Severe aortic stenosis    a. s/p TAVR 04/2018.   Stroke [facility]) 2008    SURGICAL HISTORY: Past Surgical History:  Procedure Laterality Date   ABDOMINAL HYSTERECTOMY     BILIARY BRUSHING  10/16/2023   Procedure: BILIARY BRUSHING;  Surgeon: Evangeline Hilts, MD;  Location: [facility] ENDOSCOPY;  Service: Gastroenterology;;   BILIARY STENT PLACEMENT N/A 10/16/2023   Procedure: BILIARY STENT PLACEMENT;  Surgeon: Evangeline Hilts, MD;  Location: [facility] ENDOSCOPY;  Service: Gastroenterology;  Laterality: N/A;   BIOPSY  05/19/2019   Procedure: BIOPSY;  Surgeon: Lanita Pitman, MD;  Location: [facility] ENDOSCOPY;  Service: Endoscopy;;   BREAST BIOPSY Left    years ago at "a doctor's office"   COLONOSCOPY     COLONOSCOPY WITH PROPOFOL  N/A 10/02/2018    Procedure: COLONOSCOPY WITH PROPOFOL ;  Surgeon: Evangeline Hilts, MD;  Location: [facility] ENDOSCOPY;  Service: Endoscopy;  Laterality: N/A;   ENTEROSCOPY N/A 05/19/2019   Procedure: ENTEROSCOPY;  Surgeon: Lanita Pitman, MD;  Location: [facility] ENDOSCOPY;  Service: Endoscopy;  Laterality: N/A;   ERCP N/A 10/16/2023   Procedure: ENDOSCOPIC RETROGRADE CHOLANGIOPANCREATOGRAPHY (ERCP);  Surgeon: Evangeline Hilts, MD;  Location: [name];  Service: Gastroenterology;  Laterality: N/A;   ESOPHAGOGASTRODUODENOSCOPY (EGD) WITH PROPOFOL  N/A 10/02/2018   Procedure: ESOPHAGOGASTRODUODENOSCOPY (EGD) WITH PROPOFOL ;  Surgeon: Evangeline Hilts, MD;  Location: [facility] ENDOSCOPY;  Service: Endoscopy;  Laterality: N/A;   ESOPHAGOGASTRODUODENOSCOPY (EGD) WITH PROPOFOL  N/A 04/23/2019   Procedure: ESOPHAGOGASTRODUODENOSCOPY (EGD) WITH PROPOFOL ;  Surgeon: Celedonio Coil, MD;  Location: [facility] ENDOSCOPY;  Service: Endoscopy;  Laterality: N/A;   ESOPHAGOGASTRODUODENOSCOPY (EGD) WITH PROPOFOL  N/A 10/16/2023   Procedure: ESOPHAGOGASTRODUODENOSCOPY (EGD) WITH PROPOFOL ;  Surgeon: Evangeline Hilts, MD;  Location: [facility] ENDOSCOPY;  Service: Gastroenterology;  Laterality: N/A;   EUS N/A 10/16/2023   Procedure: FULL UPPER ENDOSCOPIC ULTRASOUND (EUS) RADIAL;  Surgeon: Evangeline Hilts, MD;  Location: [facility] ENDOSCOPY;  Service: Gastroenterology;  Laterality: N/A;   EYE SURGERY Bilateral    cataract removal   FINE NEEDLE ASPIRATION N/A 10/16/2023   Procedure: FINE NEEDLE ASPIRATION (FNA) LINEAR;  Surgeon: Evangeline Hilts, MD;  Location: [facility] ENDOSCOPY;  Service: Gastroenterology;  Laterality: N/A;   GIVENS CAPSULE STUDY N/A 10/02/2018   Procedure: GIVENS CAPSULE STUDY;  Surgeon: Evangeline Hilts, MD;  Location: [facility] ENDOSCOPY;  Service: Endoscopy;  Laterality: N/A;   IR IMAGING GUIDED PORT INSERTION  01/10/2024   RIGHT/LEFT HEART CATH AND CORONARY ANGIOGRAPHY N/A 03/06/2018   Procedure: RIGHT/LEFT HEART CATH AND CORONARY ANGIOGRAPHY;  Surgeon: Arty Binning, MD;  Location:  [facility] INVASIVE CV LAB;  Service: Cardiovascular;  Laterality: N/A;   SMALL BOWEL ENTEROSCOPY  05/19/2019   SPHINCTEROTOMY  10/16/2023   Procedure: SPHINCTEROTOMY;  Surgeon: Evangeline Hilts, MD;  Location: [facility] ENDOSCOPY;  Service: Gastroenterology;;   TEE WITHOUT CARDIOVERSION N/A 04/01/2018   Procedure: TRANSESOPHAGEAL ECHOCARDIOGRAM (TEE);  Surgeon: Hugh Madura, MD;  Location: [facility] ENDOSCOPY;  Service: Cardiovascular;  Laterality: N/A;   TEE WITHOUT CARDIOVERSION N/A 04/15/2018   Procedure: TRANSESOPHAGEAL ECHOCARDIOGRAM (TEE);  Surgeon: Arnoldo Lapping, MD;  Location: [facility] OR;  Service: Open Heart Surgery;  Laterality: N/A;   TOTAL KNEE ARTHROPLASTY Right 04/14/2013   Dr Rozelle Corning   TOTAL KNEE ARTHROPLASTY Right 04/14/2013   Procedure: TOTAL KNEE ARTHROPLASTY;  Surgeon: Jasmine Mesi, MD;  Location: [facility] OR;  Service: Orthopedics;  Laterality: Right;   TRANSCATHETER AORTIC VALVE REPLACEMENT, TRANSFEMORAL  04/15/2018   TRANSCATHETER AORTIC VALVE REPLACEMENT, TRANSFEMORAL Bilateral 04/15/2018   Procedure: TRANSCATHETER AORTIC VALVE REPLACEMENT, TRANSFEMORAL;  Surgeon: Arnoldo Lapping, MD;  Location: [facility] OR;  Service: Open Heart Surgery;  Laterality: Bilateral;    I have reviewed the social history and family history with the patient and they are unchanged from previous note.  ALLERGIES:  is allergic to ace inhibitors, keflex [cephalexin], and rifadin [rifampin].  MEDICATIONS:  Current Outpatient Medications  Medication Sig Dispense Refill   acetaminophen -codeine (TYLENOL  #3) 300-30 MG tablet Take 1 tablet by mouth every 8 (eight) hours as needed for moderate pain (pain score 4-6). 30 tablet 0   acetaminophen  (TYLENOL ) 325 MG tablet Take 325-650 mg by mouth every 8 (eight) hours as needed (for headaches).     amLODipine  (NORVASC ) 5 MG tablet Take 5 mg by mouth daily.     Blood Glucose Monitoring Suppl (ACCU-CHEK GUIDE ME) w/Device KIT by Does not apply route.     Blood Glucose Monitoring Suppl (ONE TOUCH  ULTRA 2) w/Device KIT Use to check blood sugars three times daily DX: E11.9 1 kit 0   cefTRIAXone  (ROCEPHIN ) IVPB Inject 2 g into the vein daily. 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 36 Units 0   Cholecalciferol  (VITAMIN D3) 5000 units CAPS Take 5,000 Units by mouth daily.     CREON  36000-114000 units CPEP capsule TAKE TWO (2) CAPSULES BY MOUTH THREE TIMES A DAY WITH MEALS TAKE 1 CAPSULE BY MOUTH AS NEEDED WITH SNACK. MAX 10/DAY *NEW PRESCRIPTION REQUEST* 900 capsule 11   daptomycin  (CUBICIN ) IVPB Inject 500 mg into the vein daily. 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 36 Units 0   furosemide  (LASIX ) 20 MG tablet Take 1 tablet (20 mg total) by mouth daily. 30 tablet 3   Lancets (ONETOUCH DELICA PLUS LANCET33G) MISC TEST BLOOD SUGAR THREE TIMES DAILY 300 each 10   lidocaine -prilocaine  (EMLA ) cream APPLY A QUARTER SIZE AMOUNT OF CREAM TO PORT - A- CATH 45 MINUTES TO 1 HOUR PRIOR TO PORT-A-CATH BEING ACCESS. *NEW PRESCRIPTION REQUEST* 90 g 11   loperamide  (IMODIUM ) 2 MG capsule Take 1 capsule (2 mg total) by mouth as needed for diarrhea or loose stools. 30 capsule 1   metFORMIN  (GLUCOPHAGE -XR) 750 MG 24 hr tablet TAKE 2 TABLETS ONE TIME DAILY WITH BREAKFAST 180 tablet 2    metoprolol  tartrate (LOPRESSOR ) 25 MG tablet Take 1 tablet (25 mg total) by mouth 2 (two) times daily. 60 tablet 3   ONETOUCH ULTRA TEST test strip TEST BLOOD SUGAR THREE TIMES DAILY 300 strip 3   pantoprazole  (PROTONIX ) 20 MG tablet TAKE 1 TABLET BY MOUTH DAILY *NEW PRESCRIPTION REQUEST* 90 tablet 11   Polyethyl Glycol-Propyl Glycol (SYSTANE OP) Place 1 drop into both eyes 2 (two) times daily.      rivaroxaban  (XARELTO ) 20 MG TABS tablet Take 1 tablet (20 mg total) by mouth daily with supper. (Patient taking differently: Take 20 mg by mouth every morning.) 30 tablet 0   No current facility-administered medications for this visit.   Facility-Administered Medications Ordered in Other Visits  Medication Dose Route Frequency Provider Last Rate Last Admin   0.9 %  sodium chloride  infusion   Intravenous Continuous Sonja Manlius, MD   Stopped at 12/11/23 1305   0.9 %  sodium chloride  infusion   Intravenous Continuous Sonja Napili[place], MD   Stopped at 12/11/23 1304    PHYSICAL EXAMINATION: ECOG PERFORMANCE STATUS: 2 - Symptomatic, <50% confined to bed  Vitals:   03/04/24 0914 03/04/24 0915  BP: (!) 146/60 (!) 140/60  Pulse: 65   Resp: 18   Temp: 98.1 F (36.7 C)   SpO2: 99%    Wt Readings from Last 3 Encounters:  03/04/24 146 lb 4.8 oz (66.4 kg)  02/19/24 151 lb 7.3 oz (68.7 kg)  02/12/24 154 lb 4 oz (70 kg)     GENERAL:alert, no distress and comfortable SKIN: skin color, texture, turgor are normal, no rashes or significant lesions EYES: normal, Conjunctiva are pink and non-injected, sclera clear NECK: supple, thyroid  normal size, non-tender, without nodularity LYMPH:  no palpable lymphadenopathy in the cervical, axillary  LUNGS: clear to auscultation and percussion with normal breathing effort HEART: regular rate & rhythm and no murmurs and no lower extremity edema ABDOMEN:abdomen soft, non-tender and normal bowel sounds Musculoskeletal:no cyanosis of digits and no clubbing  NEURO: alert &  oriented x 3 with fluent speech, no focal motor/sensory deficits  Physical Exam    LABORATORY DATA:  I have reviewed the data as listed    Latest Ref Rng & Units 03/04/2024    8:38 AM 02/18/2024    2:43 AM 02/17/2024    2:30 AM  CBC  WBC 4.0 - 10.5 K/uL 9.0  3.6  5.8   Hemoglobin 12.0 - 15.0 g/dL 9.7  9.0  8.1   Hematocrit 36.0 - 46.0 % 30.9  28.8  25.8   Platelets 150 - 400 K/uL 522  156  172         Latest Ref Rng & Units 03/04/2024    8:38 AM 02/18/2024    2:43 AM 02/17/2024    2:30 AM  CMP  Glucose 70 - 99 mg/dL 914  782  956   BUN 8 - 23 mg/dL 10  9  8    Creatinine 0.44 - 1.00 mg/dL 2.13  0.86  5.78   Sodium 135 - 145 mmol/L 136  137  136   Potassium 3.5 - 5.1 mmol/L 3.8  3.7  3.6   Chloride 98 - 111 mmol/L 99  98  96   CO2 22 - 32 mmol/L 31  30  31    Calcium  8.9 - 10.3 mg/dL 9.8  9.4  9.4   Total Protein 6.5 - 8.1 g/dL 7.7     Total Bilirubin 0.0 - 1.2 mg/dL 0.3     Alkaline Phos 38 - 126 U/L 55     AST 15 - 41 U/L 15     ALT 0 - 44 U/L 8         RADIOGRAPHIC STUDIES: I have personally reviewed the radiological images as listed and agreed with the findings in the report. No results found.    Orders Placed This Encounter  Procedures   Do not attempt resuscitation (DNR)    If patient has no pulse and is not breathing:   Do Not Attempt Resuscitation    If patient has a pulse and/or is breathing: Medical Treatment Goals:   LIMITED ADDITIONAL INTERVENTIONS: Use medication/IV fluids and cardiac monitoring as indicated; Do not use intubation or mechanical ventilation (DNI), also provide comfort medications.  Transfer to Progressive/Stepdown as indicated, avoid Intensive Care.    Consent::   Discussion documented in EHR or advanced directives reviewed   All questions were answered. The patient  knows to call the clinic with any problems, questions or concerns. No barriers to learning was detected. The total time spent in the appointment was 40 minutes, including review  of chart and various tests results, discussions about plan of care and coordination of care plan     Sonja [place], MD 03/04/2024

## 2024-03-05 DIAGNOSIS — Z8673 Personal history of transient ischemic attack (TIA), and cerebral infarction without residual deficits: Secondary | ICD-10-CM | POA: Diagnosis not present

## 2024-03-05 DIAGNOSIS — I480 Paroxysmal atrial fibrillation: Secondary | ICD-10-CM | POA: Diagnosis not present

## 2024-03-05 DIAGNOSIS — J9601 Acute respiratory failure with hypoxia: Secondary | ICD-10-CM | POA: Diagnosis not present

## 2024-03-05 DIAGNOSIS — I21A1 Myocardial infarction type 2: Secondary | ICD-10-CM | POA: Diagnosis not present

## 2024-03-05 DIAGNOSIS — J189 Pneumonia, unspecified organism: Secondary | ICD-10-CM | POA: Diagnosis not present

## 2024-03-05 DIAGNOSIS — I5033 Acute on chronic diastolic (congestive) heart failure: Secondary | ICD-10-CM | POA: Diagnosis not present

## 2024-03-05 DIAGNOSIS — E1165 Type 2 diabetes mellitus with hyperglycemia: Secondary | ICD-10-CM | POA: Diagnosis not present

## 2024-03-05 DIAGNOSIS — A419 Sepsis, unspecified organism: Secondary | ICD-10-CM | POA: Diagnosis not present

## 2024-03-05 DIAGNOSIS — I110 Hypertensive heart disease with heart failure: Secondary | ICD-10-CM | POA: Diagnosis not present

## 2024-03-05 LAB — CANCER ANTIGEN 19-9: CA 19-9: <2 U/mL (ref 0–35)

## 2024-03-06 ENCOUNTER — Other Ambulatory Visit: Payer: Self-pay

## 2024-03-06 ENCOUNTER — Ambulatory Visit: Attending: Nurse Practitioner | Admitting: Nurse Practitioner

## 2024-03-06 ENCOUNTER — Encounter: Payer: Self-pay | Admitting: Nurse Practitioner

## 2024-03-06 VITALS — BP 116/50 | HR 76 | Resp 96 | Ht 63.0 in | Wt 145.0 lb

## 2024-03-06 DIAGNOSIS — I10 Essential (primary) hypertension: Secondary | ICD-10-CM | POA: Diagnosis not present

## 2024-03-06 DIAGNOSIS — I5032 Chronic diastolic (congestive) heart failure: Secondary | ICD-10-CM

## 2024-03-06 DIAGNOSIS — I351 Nonrheumatic aortic (valve) insufficiency: Secondary | ICD-10-CM | POA: Diagnosis not present

## 2024-03-06 DIAGNOSIS — I480 Paroxysmal atrial fibrillation: Secondary | ICD-10-CM | POA: Diagnosis not present

## 2024-03-06 DIAGNOSIS — C250 Malignant neoplasm of head of pancreas: Secondary | ICD-10-CM

## 2024-03-06 DIAGNOSIS — I639 Cerebral infarction, unspecified: Secondary | ICD-10-CM

## 2024-03-06 MED ORDER — FUROSEMIDE 20 MG PO TABS: 20.0000 mg | ORAL_TABLET | Freq: Every day | ORAL | Status: DC

## 2024-03-06 NOTE — Patient Instructions (Addendum)
 Medication Instructions:  CAN take an additional 20mg  of Lasix  as needed can take an additional tablet if you gain more than 2 lbs in a day or 5lbs in a week  *If you need a refill on your cardiac medications before your next appointment, please call your pharmacy*  Lab Work: None ordered If you have labs (blood work) drawn today and your tests are completely normal, you will receive your results only by: MyChart Message (if you have MyChart) OR A paper copy in the mail If you have any lab test that is abnormal or we need to change your treatment, we will call you to review the results.  Testing/Procedures: None ordered  Follow-Up: At [facility], you and your health needs are our priority.  As part of our continuing mission to provide you with exceptional heart care, our providers are all part of one team.  This team includes your primary Cardiologist (physician) and Advanced Practice Providers or APPs (Physician Assistants and Nurse Practitioners) who all work together to provide you with the care you need, when you need it.  Your next appointment:   6 month(s)  Provider:   Dorothye Gathers, MD    We recommend signing up for the patient portal called "MyChart".  Sign up information is provided on this After Visit Summary.  MyChart is used to connect with patients for Virtual Visits (Telemedicine).  Patients are able to view lab/test results, encounter notes, upcoming appointments, etc.  Non-urgent messages can be sent to your provider as well.   To learn more about what you can do with MyChart, go to [email or web address].   Other Instructions: DRINK 64 OUNCES OF WATER  PER DAY   Check your blood pressure daily for 2 weeks, then contact the office with your readings.  Contact the office either by phone or MyChart with your readings.  Make sure to check your blood pressure 2 hours after taking your medications.   AVOID these things for 30 minutes before checking your blood  pressure: No Drinking caffeine. No Drinking alcohol. No Eating. No Smoking. No Exercising.  Five minutes before checking your blood pressure: Pee. Sit in a dining chair. Avoid sitting in a soft couch or armchair. Be quiet. Do not talk.

## 2024-03-07 ENCOUNTER — Encounter: Payer: Self-pay | Admitting: Hematology

## 2024-03-07 DIAGNOSIS — I38 Endocarditis, valve unspecified: Secondary | ICD-10-CM | POA: Diagnosis not present

## 2024-03-07 DIAGNOSIS — Z8673 Personal history of transient ischemic attack (TIA), and cerebral infarction without residual deficits: Secondary | ICD-10-CM | POA: Diagnosis not present

## 2024-03-07 DIAGNOSIS — M1990 Unspecified osteoarthritis, unspecified site: Secondary | ICD-10-CM | POA: Diagnosis not present

## 2024-03-07 DIAGNOSIS — E1165 Type 2 diabetes mellitus with hyperglycemia: Secondary | ICD-10-CM | POA: Diagnosis not present

## 2024-03-07 DIAGNOSIS — C250 Malignant neoplasm of head of pancreas: Secondary | ICD-10-CM | POA: Diagnosis not present

## 2024-03-07 DIAGNOSIS — I21A1 Myocardial infarction type 2: Secondary | ICD-10-CM | POA: Diagnosis not present

## 2024-03-07 DIAGNOSIS — J9601 Acute respiratory failure with hypoxia: Secondary | ICD-10-CM | POA: Diagnosis not present

## 2024-03-07 DIAGNOSIS — I350 Nonrheumatic aortic (valve) stenosis: Secondary | ICD-10-CM | POA: Diagnosis not present

## 2024-03-07 DIAGNOSIS — A419 Sepsis, unspecified organism: Secondary | ICD-10-CM | POA: Diagnosis not present

## 2024-03-07 DIAGNOSIS — I5033 Acute on chronic diastolic (congestive) heart failure: Secondary | ICD-10-CM | POA: Diagnosis not present

## 2024-03-07 DIAGNOSIS — E1169 Type 2 diabetes mellitus with other specified complication: Secondary | ICD-10-CM | POA: Diagnosis not present

## 2024-03-07 DIAGNOSIS — J189 Pneumonia, unspecified organism: Secondary | ICD-10-CM | POA: Diagnosis not present

## 2024-03-07 DIAGNOSIS — I480 Paroxysmal atrial fibrillation: Secondary | ICD-10-CM | POA: Diagnosis not present

## 2024-03-07 DIAGNOSIS — I10 Essential (primary) hypertension: Secondary | ICD-10-CM | POA: Diagnosis not present

## 2024-03-07 DIAGNOSIS — I110 Hypertensive heart disease with heart failure: Secondary | ICD-10-CM | POA: Diagnosis not present

## 2024-03-09 ENCOUNTER — Other Ambulatory Visit: Payer: Self-pay | Admitting: Nurse Practitioner

## 2024-03-09 ENCOUNTER — Other Ambulatory Visit: Payer: Self-pay

## 2024-03-09 DIAGNOSIS — Z124 Encounter for screening for malignant neoplasm of cervix: Secondary | ICD-10-CM | POA: Diagnosis not present

## 2024-03-09 DIAGNOSIS — C250 Malignant neoplasm of head of pancreas: Secondary | ICD-10-CM

## 2024-03-10 ENCOUNTER — Encounter: Payer: Self-pay | Admitting: Infectious Diseases

## 2024-03-10 ENCOUNTER — Other Ambulatory Visit: Payer: Self-pay

## 2024-03-10 ENCOUNTER — Telehealth: Admitting: Infectious Diseases

## 2024-03-10 ENCOUNTER — Telehealth: Payer: Self-pay

## 2024-03-10 DIAGNOSIS — I330 Acute and subacute infective endocarditis: Secondary | ICD-10-CM

## 2024-03-10 DIAGNOSIS — Z95828 Presence of other vascular implants and grafts: Secondary | ICD-10-CM | POA: Diagnosis not present

## 2024-03-10 DIAGNOSIS — T82857D Stenosis of cardiac prosthetic devices, implants and grafts, subsequent encounter: Secondary | ICD-10-CM | POA: Diagnosis not present

## 2024-03-10 DIAGNOSIS — B9689 Other specified bacterial agents as the cause of diseases classified elsewhere: Secondary | ICD-10-CM | POA: Diagnosis not present

## 2024-03-10 NOTE — Patient Instructions (Signed)
 Have the team at the oncology paliative center take a look at your port tomorrow if it is still uncomfortable.  I am also going to send Dr. [name] a message as well but I would encourage you to talk with your team's supervisor more about the experience. We agree that it's great to learn but also don't want to cause undue discomfort.   Please call our clinic [phone] if you notice any fevers, chills, coughing or rashes before your antibiotics finish on 6.18  We will be arranging a heart ultrasound for you that week as well.

## 2024-03-10 NOTE — Telephone Encounter (Addendum)
 Working on [employer] for [place] ordered by [employer] Patient has Medicaid and [employer]. Will note in referral as requested by Barry Boring with [facility] and then they will contact patient with Echo appointment date and time.  Location:[facility] at [facility]: [id number] [employer] NPI [phone]   CPT : 93306        [phone] [id number] ICD10: Z95.828 I33.0

## 2024-03-10 NOTE — Progress Notes (Signed)
 Virtual Visit via Video Note  I connected with Janet Williamson on 03/10/24 at  1:30 PM EDT by a video enabled telemedicine application and verified that I am speaking with the correct person using two identifiers.  Location: Patient: [facility]  Provider: [facility]   I discussed the limitations of evaluation and management by telemedicine and the availability of in person appointments. The patient expressed understanding and agreed to proceed.  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  She was admitted May 7 through May 14.  She has a past medical history of pancreatic adenocarcinoma with likely liver metastasis on palliative chemotherapy with gemcitabine  followed by Dr. Fernand Howard.  She also has a history of TAVR placement for severe aortic stenosis, CVA, DM, HTN, GI bleed.  Initial complaint Hospital presentation was related to swelling in her legs and orthopnea.  She had several weeks of productive cough and rhinorrhea but no fevers or chills or sweats.  In the emergency room she had a mild leukocytosis to 12,000.  Blood cultures were drawn on the 7th, 8th and 10th all revealing no organisms.  These were all collected after antibiotics were started.  Chest x-ray revealed diffuse airspace opacities likely related to pulmonary edema.  She was placed on a nitroglycerin  drip as well as intravenous Lasix  and BiPAP.  Ceftriaxone  azithromycin  for possible CAP.  Transthoracic echo revealed a acutely thickened TAVR valve with severe thickening of the leaflet and moderate to severe AVR concerning for vegetation.   She has a chronic port in place and is immunosuppressed in her cancer treatment.  She was placed on intravenous daptomycin  and ceftriaxone  for the recommended course of 6 weeks.  These have all been given through her port.  There was some trouble early on with her double dosing her daptomycin  and not taking the ceftriaxone .   This has been since corrected.  No acute changes noted with her lab results.   She had a follow-up visit with Dr. Grayland Le with oncology on 28 May.  Chemotherapy is on hold given the concern for acute endocarditis. She is to do to complete her antibiotics on June 18.  Given her overall poor response to chemotherapy and evidence of cancer progression no further chemotherapy was recommended and they are likely going to plan a hospice referral when she is more symptomatic.   We had the video on  Janet Williamson her granddaughter joins us  on the call. Painful and the access port looks further down. Seems lower and uncomfortable.  The double daptomycin  only happened about 3-4 days.    Observations/Objective: No shortness of breath detected on the video. She is breathing comfortably. Chest port is tender - unable to see on the video today prior to the audio cutting out and converting to telephone to finish.   Assessment and Plan:   Aortic valve infection -  Culture Negative, TAVR -  Suspected aortic valve infection involving the replaced aortic valve. No side effects from antibiotics reported. Previous issue with double dosing corrected after three to four days. No rash, cough, or diarrhea present. Blood work monitored closely, no adverse effects observed. Daptomycin  can cause lung inflammation, advised to watch for fever or cough. She consents to continue antibiotic treatment until June 18th, aligning with her goals and wishes. - Continue Daptomycin  + Ceftriaxone  until June 18th as planned  - ESR/CRP are chronically elevated - difficult to interpret in the setting of cancer and other medical conditions  -  Repeat echocardiogram near completion of antibiotics - requested the week of 6.16 in hopes this will be available by her FU with Dr. Shereen Dike.  - Monitor for fever or cough as potential side effects of daptomycin  - Review lab work once available - Will consider suppression after IV antibiotics complete    Port  Discomfort -  She mentioned that the accessing from recent nurse was not idea and quite painful. She has FU with the palliative oncology team tomorrow and I asked her to please ask if someone can assess it. I will also message Dr. [name] to see if she has any other recommendations.  It is not bleeding, leaking or swollen appearing.   Goals of Care -  She and her family have deferred further chemotherapy as per most recent discussion with Dr. [name]. Janet Williamson did acknowledge she would like to continue the antibiotic course through 6/18.      Medication Monitoring -  All OPAT lab work reviewed. ESR 95 (no baseline in hospital)  CRP during hospital stay 34 mg/L  >> 18mg /L   Orders Placed This Encounter  Procedures   ECHOCARDIOGRAM COMPLETE    Standing Status:   Future    Expected Date:   03/23/2024    Expiration Date:   03/10/2025    Scheduling Instructions:     Would like the week of 6/16 please - thank you    Where should this test be performed:   [facility]    Does the patient weigh less than or greater than 250 lbs?:   Patient weighs less than 250 lbs    Perflutren DEFINITY (image enhancing agent) should be administered unless hypersensitivity or allergy exist:   Administer Perflutren    Reason for exam-Echo:   Endocarditis I38   No orders of the defined types were placed in this encounter.  03/25/2024 is her next appt with Dr. Llewellyn Riles, MSN, NP-C [facility]  [email or web address] Pager: [phone] Office: [phone] [facility] Main Line: [phone] *Secure Chat Communication Welcome

## 2024-03-11 ENCOUNTER — Encounter: Payer: Self-pay | Admitting: Nurse Practitioner

## 2024-03-11 ENCOUNTER — Inpatient Hospital Stay: Attending: Nurse Practitioner | Admitting: Nurse Practitioner

## 2024-03-11 VITALS — BP 114/59 | HR 76 | Temp 98.4°F | Resp 18

## 2024-03-11 DIAGNOSIS — C250 Malignant neoplasm of head of pancreas: Secondary | ICD-10-CM | POA: Diagnosis not present

## 2024-03-11 DIAGNOSIS — Z515 Encounter for palliative care: Secondary | ICD-10-CM | POA: Diagnosis not present

## 2024-03-11 DIAGNOSIS — G893 Neoplasm related pain (acute) (chronic): Secondary | ICD-10-CM | POA: Insufficient documentation

## 2024-03-11 DIAGNOSIS — R530 Neoplastic (malignant) related fatigue: Secondary | ICD-10-CM

## 2024-03-11 DIAGNOSIS — D649 Anemia, unspecified: Secondary | ICD-10-CM | POA: Insufficient documentation

## 2024-03-11 DIAGNOSIS — Z7189 Other specified counseling: Secondary | ICD-10-CM | POA: Diagnosis not present

## 2024-03-11 MED ORDER — TRAMADOL HCL 50 MG PO TABS: 25.0000 mg | ORAL_TABLET | Freq: Four times a day (QID) | ORAL | 0 refills | Status: DC | PRN

## 2024-03-11 MED ORDER — BACLOFEN 10 MG PO TABS: 10.0000 mg | ORAL_TABLET | Freq: Three times a day (TID) | ORAL | 0 refills | Status: AC

## 2024-03-11 NOTE — Telephone Encounter (Signed)
 Auth approvedfor Echo per [employer] until 05/11/24. Auth information attached to referral and patient is scheduled for 03/25/2024 per Katie at [facility]

## 2024-03-11 NOTE — Progress Notes (Signed)
 Palliative Medicine [facility]  [phone] Fax:([phone]   Name: Janet Williamson Date: 03/11/2024 MRN: [id number]  DOB: Sep 12, 1941  Patient Care Team: Benedetto Brady, MD as PCP - General (Family Medicine) Hugh Madura, MD as PCP - Cardiology (Cardiology) Sonja Bostonia, MD as Consulting Physician (Hematology) Evangeline Hilts, MD as Consulting Physician (Gastroenterology) Ozell Blunt, MD as Consulting Physician (Gastroenterology)    REASON FOR CONSULTATION: Janet Williamson is a 83 y.o. female with oncologic medical history including pancreatic cancer (08/2023) not a further candidate for further treatment due to age and recent hospitalization for endorcarditis (02/2024).  Palliative is seeing patient for symptom management and goals of care.    SOCIAL HISTORY:     reports that she has never smoked. She has never used smokeless tobacco. She reports that she does not drink alcohol and does not use drugs.  ADVANCE DIRECTIVES:  None on file   CODE STATUS: DNR  PAST MEDICAL HISTORY: 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 Not seen on most recent TEE 2019.   Cardiomyopathy in other disease    Cataracts, bilateral    Diabetes mellitus    Type 2   Generalized osteoarthritis    GERD (gastroesophageal reflux disease)    HLD (hyperlipidemia)    Hypertension    Hypothyroidism    LBBB (left bundle branch block)    PAF (paroxysmal atrial fibrillation) (HCC)    a. s/p DCCV 06/2017, on Xarelto    Phlebitis    S/P TAVR (transcatheter aortic valve replacement) 04/15/2018   Edwards Sapien 3 THV (size 23 mm, model # 9600TFX, serial # [id number]) via the TF approach   Severe aortic stenosis    a. s/p TAVR 04/2018.   Stroke [facility]) 2008    PAST SURGICAL HISTORY:  Past Surgical History:  Procedure Laterality Date   ABDOMINAL HYSTERECTOMY     BILIARY  BRUSHING  10/16/2023   Procedure: BILIARY BRUSHING;  Surgeon: Evangeline Hilts, MD;  Location: [name] ENDOSCOPY;  Service: Gastroenterology;;   BILIARY STENT PLACEMENT N/A 10/16/2023   Procedure: BILIARY STENT PLACEMENT;  Surgeon: Evangeline Hilts, MD;  Location: [facility] ENDOSCOPY;  Service: Gastroenterology;  Laterality: N/A;   BIOPSY  05/19/2019   Procedure: BIOPSY;  Surgeon: Lanita Pitman, MD;  Location: [facility] ENDOSCOPY;  Service: Endoscopy;;   BREAST BIOPSY Left    years ago at "a doctor's office"   COLONOSCOPY     COLONOSCOPY WITH PROPOFOL  N/A 10/02/2018   Procedure: COLONOSCOPY WITH PROPOFOL ;  Surgeon: Evangeline Hilts, MD;  Location: [facility] ENDOSCOPY;  Service: Endoscopy;  Laterality: N/A;   ENTEROSCOPY N/A 05/19/2019   Procedure: ENTEROSCOPY;  Surgeon: Lanita Pitman, MD;  Location: [facility] ENDOSCOPY;  Service: Endoscopy;  Laterality: N/A;   ERCP N/A 10/16/2023   Procedure: ENDOSCOPIC RETROGRADE CHOLANGIOPANCREATOGRAPHY (ERCP);  Surgeon: Evangeline Hilts, MD;  Location: [name]OSCOPY;  Service: Gastroenterology;  Laterality: N/A;   ESOPHAGOGASTRODUODENOSCOPY (EGD) WITH PROPOFOL  N/A 10/02/2018   Procedure: ESOPHAGOGASTRODUODENOSCOPY (EGD) WITH PROPOFOL ;  Surgeon: Evangeline Hilts, MD;  Location: [facility] ENDOSCOPY;  Service: Endoscopy;  Laterality: N/A;   ESOPHAGOGASTRODUODENOSCOPY (EGD) WITH PROPOFOL  N/A 04/23/2019   Procedure: ESOPHAGOGASTRODUODENOSCOPY (EGD) WITH PROPOFOL ;  Surgeon: Celedonio Coil, MD;  Location: [facility] ENDOSCOPY;  Service: Endoscopy;  Laterality: N/A;   ESOPHAGOGASTRODUODENOSCOPY (EGD) WITH PROPOFOL  N/A 10/16/2023   Procedure: ESOPHAGOGASTRODUODENOSCOPY (EGD) WITH PROPOFOL ;  Surgeon: Evangeline Hilts, MD;  Location:  [facility] ENDOSCOPY;  Service: Gastroenterology;  Laterality: N/A;   EUS N/A 10/16/2023   Procedure: FULL UPPER ENDOSCOPIC ULTRASOUND (EUS) RADIAL;  Surgeon: Evangeline Hilts, MD;  Location: [facility] ENDOSCOPY;  Service: Gastroenterology;  Laterality: N/A;   EYE SURGERY Bilateral    cataract removal   FINE NEEDLE  ASPIRATION N/A 10/16/2023   Procedure: FINE NEEDLE ASPIRATION (FNA) LINEAR;  Surgeon: Evangeline Hilts, MD;  Location: [facility] ENDOSCOPY;  Service: Gastroenterology;  Laterality: N/A;   GIVENS CAPSULE STUDY N/A 10/02/2018   Procedure: GIVENS CAPSULE STUDY;  Surgeon: Evangeline Hilts, MD;  Location: [facility] ENDOSCOPY;  Service: Endoscopy;  Laterality: N/A;   IR IMAGING GUIDED PORT INSERTION  01/10/2024   RIGHT/LEFT HEART CATH AND CORONARY ANGIOGRAPHY N/A 03/06/2018   Procedure: RIGHT/LEFT HEART CATH AND CORONARY ANGIOGRAPHY;  Surgeon: Arty Binning, MD;  Location: [facility] INVASIVE CV LAB;  Service: Cardiovascular;  Laterality: N/A;   SMALL BOWEL ENTEROSCOPY  05/19/2019   SPHINCTEROTOMY  10/16/2023   Procedure: SPHINCTEROTOMY;  Surgeon: Evangeline Hilts, MD;  Location: [facility] ENDOSCOPY;  Service: Gastroenterology;;   TEE WITHOUT CARDIOVERSION N/A 04/01/2018   Procedure: TRANSESOPHAGEAL ECHOCARDIOGRAM (TEE);  Surgeon: Hugh Madura, MD;  Location: [facility] ENDOSCOPY;  Service: Cardiovascular;  Laterality: N/A;   TEE WITHOUT CARDIOVERSION N/A 04/15/2018   Procedure: TRANSESOPHAGEAL ECHOCARDIOGRAM (TEE);  Surgeon: Arnoldo Lapping, MD;  Location: [facility] OR;  Service: Open Heart Surgery;  Laterality: N/A;   TOTAL KNEE ARTHROPLASTY Right 04/14/2013   Dr Rozelle Corning   TOTAL KNEE ARTHROPLASTY Right 04/14/2013   Procedure: TOTAL KNEE ARTHROPLASTY;  Surgeon: Jasmine Mesi, MD;  Location: [facility] OR;  Service: Orthopedics;  Laterality: Right;   TRANSCATHETER AORTIC VALVE REPLACEMENT, TRANSFEMORAL  04/15/2018   TRANSCATHETER AORTIC VALVE REPLACEMENT, TRANSFEMORAL Bilateral 04/15/2018   Procedure: TRANSCATHETER AORTIC VALVE REPLACEMENT, TRANSFEMORAL;  Surgeon: Arnoldo Lapping, MD;  Location: [facility] OR;  Service: Open Heart Surgery;  Laterality: Bilateral;    HEMATOLOGY/ONCOLOGY HISTORY:  Oncology History  Malignant neoplasm of head of pancreas (HCC)  10/16/2023 Cancer Staging   Staging form: Exocrine Pancreas, AJCC 8th Edition - Clinical stage from 10/16/2023:  Stage IIA (cT3, cN0, cM0) - Signed by Sonja Mantua, MD on 11/20/2023 Total positive nodes: 0   10/24/2023 Initial Diagnosis   Malignant neoplasm of head of pancreas (HCC)   11/13/2023 -  Chemotherapy   Patient is on Treatment Plan : PANCREATIC Abraxane  D1,8,15 + Gemcitabine  D1,8,15 q28d     12/04/2023 Genetic Testing   Negative genetic testing on the CancerNext+RNA panel.  BRCA1  p.D695N (c.2083G>A)  VUS identified.  The report date is 12/03/2023.  The [facility] CancerNext+RNAinsight Panel includes sequencing, rearrangement analysis, and RNA analysis for the following 39 genes: APC, ATM, BAP1, BARD1, BMPR1A, BRCA1, BRCA2, BRIP1, CDH1, CDKN2A, CHEK2, FH, FLCN, MET, MLH1, MSH2, MSH6, MUTYH, NF1, NTHL1, PALB2, PMS2, PTEN, RAD51C, RAD51D, SMAD4, STK11, TP53, TSC1, TSC2, and VHL (sequencing and deletion/duplication); AXIN2, HOXB13, MBD4, MSH3, POLD1 and POLE (sequencing only); EPCAM and GREM1 (deletion/duplication only).      ALLERGIES:  is allergic to ace inhibitors, keflex [cephalexin], and rifadin [rifampin].  MEDICATIONS:  Current Outpatient Medications  Medication Sig Dispense Refill   baclofen (LIORESAL) 10 MG tablet Take 1 tablet (10 mg total) by mouth 3 (three) times daily. 30 each 0   traMADol  (ULTRAM ) 50 MG tablet Take 0.5-1 tablets (25-50 mg total) by mouth every 6 (six) hours as needed. 30 tablet 0   acetaminophen  (TYLENOL ) 325 MG tablet Take 325-650 mg by mouth every 8 (eight) hours as needed (for headaches).  acetaminophen -codeine  (TYLENOL  #3) 300-30 MG tablet Take 1 tablet by mouth every 8 (eight) hours as needed for moderate pain (pain score 4-6). 30 tablet 0   amLODipine  (NORVASC ) 5 MG tablet Take 5 mg by mouth daily.     Blood Glucose Monitoring Suppl (ACCU-CHEK GUIDE ME) w/Device KIT by Does not apply route.     Blood Glucose Monitoring Suppl (ONE TOUCH ULTRA 2) w/Device KIT Use to check blood sugars three times daily DX: E11.9 1 kit 0   cefTRIAXone  (ROCEPHIN ) IVPB Inject 2 g into the  vein daily. 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 36 Units 0   Cholecalciferol  (VITAMIN D3) 5000 units CAPS Take 5,000 Units by mouth daily.     CREON  36000-114000 units CPEP capsule TAKE TWO (2) CAPSULES BY MOUTH THREE TIMES A DAY WITH MEALS TAKE 1 CAPSULE BY MOUTH AS NEEDED WITH SNACK. MAX 10/DAY *NEW PRESCRIPTION REQUEST* 900 capsule 11   daptomycin  (CUBICIN ) IVPB Inject 500 mg into the vein daily. 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 36 Units 0   furosemide  (LASIX ) 20 MG tablet Take 1 tablet (20 mg total) by mouth daily. Can take an additional tablet as needed for weight gain     Lancets (ONETOUCH DELICA PLUS LANCET33G) MISC TEST BLOOD SUGAR THREE TIMES DAILY 300 each 10   lidocaine -prilocaine  (EMLA ) cream APPLY A QUARTER SIZE AMOUNT OF CREAM TO PORT - A- CATH 45 MINUTES TO 1 HOUR PRIOR TO PORT-A-CATH BEING ACCESS. *NEW PRESCRIPTION REQUEST* 90 g 11   loperamide  (IMODIUM ) 2 MG capsule Take 1 capsule (2 mg total) by mouth as needed for diarrhea or loose stools. 30 capsule 1   metFORMIN  (GLUCOPHAGE -XR) 750 MG 24 hr tablet TAKE 2 TABLETS ONE TIME DAILY WITH BREAKFAST 180 tablet 2   metoprolol  tartrate (LOPRESSOR ) 25 MG tablet Take 1 tablet (25 mg total) by mouth 2 (two)  times daily. 60 tablet 3   ONETOUCH ULTRA TEST test strip TEST BLOOD SUGAR THREE TIMES DAILY 300 strip 3   pantoprazole  (PROTONIX ) 20 MG tablet TAKE 1 TABLET BY MOUTH DAILY *NEW PRESCRIPTION REQUEST* 90 tablet 11   Polyethyl Glycol-Propyl Glycol (SYSTANE OP) Place 1 drop into both eyes 2 (two) times daily.      rivaroxaban  (XARELTO ) 20 MG TABS tablet Take 1 tablet (20 mg total) by mouth daily with supper. (Patient taking differently: Take 20 mg by mouth every morning.) 30 tablet 0   No current facility-administered medications for this visit.   Facility-Administered Medications Ordered in Other Visits  Medication Dose Route Frequency Provider Last Rate Last Admin   0.9 %  sodium chloride  infusion   Intravenous Continuous Sonja [place], MD   Stopped at 12/11/23 1305   0.9 %  sodium chloride  infusion   Intravenous Continuous Sonja Buckingham, MD   Stopped at 12/11/23 1304  VITAL SIGNS: BP (!) 114/59 (BP Location: Left Arm, Patient Position: Sitting)   Pulse 76   Temp 98.4 F (36.9 C) (Oral)   Resp 18   SpO2 100%  There were no vitals filed for this visit.  Estimated body mass index is 25.69 kg/m as calculated from the following:   Height as of 03/06/24: 5\' 3"  (1.6 m).   Weight as of 03/06/24: 145 lb (65.8 kg).  LABS: CBC:    Component Value Date/Time   WBC 9.0 03/04/2024 0838   WBC 3.6 (L) 02/18/2024 0243   HGB 9.7 (L) 03/04/2024 0838   HGB 8.0 (L) 03/13/2019 1115   HCT 30.9 (L) 03/04/2024 0838   HCT 29.9 (L) 03/13/2019 1115   PLT 522 (H) 03/04/2024 0838   PLT 511 (H) 03/13/2019 1115   MCV 76.9 (L) 03/04/2024 0838   MCV 67 (L) 03/13/2019 1115   NEUTROABS 6.0 03/04/2024 0838   NEUTROABS 5.2 03/04/2018 1215   LYMPHSABS 2.1 03/04/2024 0838   LYMPHSABS 2.4 03/04/2018 1215   MONOABS 0.6 03/04/2024 0838   EOSABS 0.2 03/04/2024 0838   EOSABS 0.1 03/04/2018 1215   BASOSABS 0.1 03/04/2024 0838   BASOSABS 0.0 03/04/2018 1215   Comprehensive Metabolic Panel:    Component Value Date/Time    NA 136 03/04/2024 0838   NA 137 03/13/2019 1115   K 3.8 03/04/2024 0838   CL 99 03/04/2024 0838   CO2 31 03/04/2024 0838   BUN 10 03/04/2024 0838   BUN 13 03/13/2019 1115   CREATININE 0.63 03/04/2024 0838   GLUCOSE 166 (H) 03/04/2024 0838   CALCIUM  9.8 03/04/2024 0838   AST 15 03/04/2024 0838   ALT 8 03/04/2024 0838   ALKPHOS 55 03/04/2024 0838   BILITOT 0.3 03/04/2024 0838   PROT 7.7 03/04/2024 0838   PROT 7.7 10/17/2018 1255   ALBUMIN 3.6 03/04/2024 0838   ALBUMIN 4.2 10/17/2018 1255    RADIOGRAPHIC STUDIES: DG CHEST PORT 1 VIEW Result Date: 02/15/2024 CLINICAL DATA:  Follow-up pneumonia. EXAM: PORTABLE CHEST 1 VIEW COMPARISON:  02/12/2024 FINDINGS: The cardiac silhouette, mediastinal hilar contours are stable. Stable tortuosity and calcification of the thoracic aorta. Stable right IJ Port-A-Cath. Stable surgical changes from TAVR. The lungs demonstrate much improved aeration with resolving bilateral pulmonary infiltrates. No pleural effusions or pneumothorax. IMPRESSION: Much improved lung aeration with resolving bilateral pulmonary infiltrates. Electronically Signed   By: Marrian Siva M.D.   On: 02/15/2024 17:47   ECHOCARDIOGRAM COMPLETE Result Date: 02/13/2024    ECHOCARDIOGRAM REPORT   Patient Name:   RENELDA KILIAN Date of Exam: 02/13/2024 Medical Rec #:  [id number]         Height:       63.0 in Accession #:    [id number]        Weight:       154.2 lb Date of Birth:  08/25/1941          BSA:          1.732 m Patient Age:    83 years          BP:           105/45 mmHg Patient Gender: F                 HR:           72 bpm. Exam Location:  Inpatient Procedure: 2D Echo, 3D Echo, Cardiac Doppler and Color Doppler (Both Spectral  and Color Flow Doppler were utilized during procedure). Indications:    I50.40* Unspecified combined systolic (congestive) and diastolic                 (congestive) heart failure  History:        Patient has prior history of Echocardiogram  examinations, most                 recent 03/20/2019. Abnormal ECG, Stroke, Aortic Valve Disease,                 Arrythmias:LBBB, Signs/Symptoms:Edema and Dyspnea; Risk                 Factors:Hypertension and Diabetes. Aortic stenosis. TAVR.                 Metastatic cancer. Chemo.                 Aortic Valve: 23 mm Edwards Sapien prosthetic, stented (TAVR)                 valve is present in the aortic position. Procedure Date:                 04/15/2018.  Sonographer:    Raynelle Callow RDCS Referring Phys: [id number] [facility] IMPRESSIONS  1. Left ventricular ejection fraction, by estimation, is 55 to 60%. The left ventricle has normal function. The left ventricle has no regional wall motion abnormalities. There is moderate concentric left ventricular hypertrophy. Left ventricular diastolic parameters are consistent with Grade I diastolic dysfunction (impaired relaxation). Elevated left ventricular end-diastolic pressure.  2. Right ventricular systolic function is normal. The right ventricular size is normal. There is mildly elevated pulmonary artery systolic pressure.  3. Left atrial size was mildly dilated.  4. The mitral valve is degenerative. Moderate to severe mitral valve regurgitation. Mild mitral stenosis. Moderate mitral annular calcification.  5. Tricuspid valve regurgitation is mild to moderate.  6. TAVR valve is thickened and gradients are severely elevated consistent with prosthetic valve stenosis. Mean gradient has increased to 55 mmHg from 30 mmHg 03/2019. The aortic valve has been repaired/replaced. There is severe calcifcation of the aortic  valve. There is severe thickening of the aortic valve. Aortic valve regurgitation is moderate to severe. There is a 23 mm Edwards Sapien prosthetic (TAVR) valve present in the aortic position. Procedure Date: 04/15/2018. Echo findings are consistent with stenosis and regurgitation of the aortic prosthesis. Aortic valve area, by VTI measures 1.07 cm. Aortic valve  mean gradient measures 55.0 mmHg. Aortic valve Vmax measures 4.94 m/s.  7. Pulmonic valve regurgitation is moderate.  8. The inferior vena cava is dilated in size with >50% respiratory variability, suggesting right atrial pressure of 8 mmHg. Conclusion(s)/Recommendation(s): Findings concerning for aortic valve vegetation, would recommend a Transesophageal Echocardiogram for clarification. FINDINGS  Left Ventricle: Left ventricular ejection fraction, by estimation, is 55 to 60%. The left ventricle has normal function. The left ventricle has no regional wall motion abnormalities. The left ventricular internal cavity size was normal in size. There is  moderate concentric left ventricular hypertrophy. Left ventricular diastolic parameters are consistent with Grade I diastolic dysfunction (impaired relaxation). Elevated left ventricular end-diastolic pressure. Right Ventricle: The right ventricular size is normal. No increase in right ventricular wall thickness. Right ventricular systolic function is normal. There is mildly elevated pulmonary artery systolic pressure. The tricuspid regurgitant velocity is 2.94  m/s, and with an assumed right atrial pressure of 8 mmHg, the estimated right  ventricular systolic pressure is 42.6 mmHg. Left Atrium: Left atrial size was mildly dilated. Right Atrium: Right atrial size was normal in size. Pericardium: There is no evidence of pericardial effusion. Mitral Valve: The mitral valve is degenerative in appearance. There is moderate thickening of the mitral valve leaflet(s). There is mild calcification of the mitral valve leaflet(s). Moderate mitral annular calcification. Moderate to severe mitral valve regurgitation. Mild mitral valve stenosis. MV peak gradient, 10.4 mmHg. The mean mitral valve gradient is 4.0 mmHg. Tricuspid Valve: The tricuspid valve is normal in structure. Tricuspid valve regurgitation is mild to moderate. No evidence of tricuspid stenosis. Aortic Valve: TAVR valve  is thickened and gradients are severely elevated consistent with prosthetic valve stenosis. Mean gradient has increased to 55 mmHg from 30 mmHg 03/2019. The aortic valve has been repaired/replaced. There is severe calcifcation of the aortic valve. There is severe thickening of the aortic valve. Aortic valve regurgitation is moderate to severe. Aortic regurgitation PHT measures 409 msec. Aortic valve mean gradient measures 55.0 mmHg. Aortic valve peak gradient measures 97.6 mmHg. Aortic valve area, by VTI measures 1.07 cm. There is a 23 mm Edwards Sapien prosthetic, stented (TAVR) valve present in the aortic position. Procedure Date: 04/15/2018. Pulmonic Valve: The pulmonic valve was grossly normal. Pulmonic valve regurgitation is moderate. No evidence of pulmonic stenosis. Aorta: The aortic root and ascending aorta are structurally normal, with no evidence of dilitation. Venous: The inferior vena cava is dilated in size with greater than 50% respiratory variability, suggesting right atrial pressure of 8 mmHg. IAS/Shunts: No atrial level shunt detected by color flow Doppler. Additional Comments: 3D was performed not requiring image post processing on an independent workstation and was abnormal.  LEFT VENTRICLE PLAX 2D LVIDd:         3.80 cm     Diastology LVIDs:         2.60 cm     LV e' medial:    4.24 cm/s LV PW:         1.30 cm     LV E/e' medial:  30.1 LV IVS:        1.40 cm     LV e' lateral:   5.11 cm/s LVOT diam:     2.30 cm     LV E/e' lateral: 25.0 LV SV:         116 LV SV Index:   67 LVOT Area:     4.15 cm  LV Volumes (MOD) LV vol d, MOD A2C: 87.2 ml LV vol d, MOD A4C: 86.3 ml LV vol s, MOD A2C: 44.6 ml LV vol s, MOD A4C: 38.9 ml LV SV MOD A2C:     42.6 ml LV SV MOD A4C:     86.3 ml LV SV MOD BP:      46.3 ml RIGHT VENTRICLE             IVC RV S prime:     10.90 cm/s  IVC diam: 2.20 cm TAPSE (M-mode): 2.6 cm LEFT ATRIUM             Index        RIGHT ATRIUM           Index LA diam:        4.30 cm 2.48 cm/m    RA Area:     12.60 cm LA Vol (A2C):   53.3 ml 30.78 ml/m  RA Volume:   27.80 ml  16.06 ml/m LA Vol (A4C):   56.0  ml 32.34 ml/m LA Biplane Vol: 54.7 ml 31.59 ml/m  AORTIC VALVE                     PULMONIC VALVE AV Area (Vmax):    1.03 cm      PR End Diast Vel: 1.67 msec AV Area (Vmean):   1.06 cm AV Area (VTI):     1.07 cm AV Vmax:           494.00 cm/s AV Vmean:          336.500 cm/s AV VTI:            1.080 m AV Peak Grad:      97.6 mmHg AV Mean Grad:      55.0 mmHg LVOT Vmax:         123.00 cm/s LVOT Vmean:        86.200 cm/s LVOT VTI:          0.278 m LVOT/AV VTI ratio: 0.26 AI PHT:            409 msec  AORTA Ao Root diam: 2.80 cm Ao Asc diam:  3.70 cm MITRAL VALVE                  TRICUSPID VALVE MV Area (PHT): 3.10 cm       TR Peak grad:   34.6 mmHg MV Area VTI:   2.62 cm       TR Vmax:        294.00 cm/s MV Peak grad:  10.4 mmHg MV Mean grad:  4.0 mmHg       SHUNTS MV Vmax:       1.61 m/s       Systemic VTI:  0.28 m MV Vmean:      99.8 cm/s      Systemic Diam: 2.30 cm MV Decel Time: 245 msec MR Peak grad:    180.1 mmHg MR Mean grad:    77.0 mmHg MR Vmax:         671.00 cm/s MR Vmean:        372.0 cm/s MR PISA:         3.08 cm MR PISA Eff ROA: 18 mm MR PISA Radius:  0.70 cm MV E velocity: 127.50 cm/s MV A velocity: 137.50 cm/s MV E/A ratio:  0.93 Maudine Sos MD Electronically signed by Maudine Sos MD Signature Date/Time: 02/13/2024/12:36:11 PM    Final    DG Chest Portable 1 View Result Date: 02/12/2024 CLINICAL DATA:  Shortness of breath EXAM: PORTABLE CHEST 1 VIEW COMPARISON:  02/09/2024 FINDINGS: Check shadow is stable. Changes of prior TAVR are noted. Right chest wall port is again seen. Aortic calcifications are noted. The lungs are well aerated bilaterally. Diffuse airspace opacity is noted right greater than left consistent with diffuse edema given the abrupt onset. No sizable effusion is seen. No bony abnormality is noted. IMPRESSION: Diffuse airspace opacities bilaterally likely  related to pulmonary edema. Electronically Signed   By: Violeta Grey M.D.   On: 02/12/2024 22:17    PERFORMANCE STATUS (ECOG) : 2 - Symptomatic, <50% confined to bed  Review of Systems  Constitutional:  Positive for activity change, appetite change and fatigue.  Gastrointestinal:  Positive for abdominal pain.  Musculoskeletal:  Positive for back pain.  Unless otherwise noted, a complete review of systems is negative.  Physical Exam General: NAD Cardiovascular: regular rate and rhythm Pulmonary: clear ant fields Abdomen: soft, tender, + bowel sounds  Extremities: no edema, no joint deformities Skin: no rashes Neurological: Alert and oriented x3  IMPRESSION: Discussed the use of AI scribe software for clinical note transcription with the patient, who gave verbal consent to proceed.  History of Present Illness Janet Williamson is an 83 year old female with pancreatic cancer who presents with lower back and leg pain. This is her initial palliative visit. She is accompanied by her granddaughter, Janet Williamson. Patient ambulatory with Rolator. Alert and able to engage appropriately in discussions.   I introduced myself, Maygan RN, and Palliative's role in collaboration with the oncology team. Concept of Palliative Care was introduced as specialized medical care for people and their families living with serious illness.  It focuses on providing relief from the symptoms and stress of a serious illness.  The goal is to improve quality of life for both the patient and the family. Values and goals of care important to patient and family were attempted to be elicited.   She lives alone and is able to perform most activities independently. 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 She is originally from [place], [place] . She has 7 living children and 2 deceased, more than 30 grandchildren. Her family is most important to her.   No recent falls, nausea, vomiting, constipation,  or diarrhea. Her appetite is good, and her weight has been stable. She sleeps well, although pain occasionally disturbs her sleep.  We discussed her pain at length. Ms. Antonopoulos shares that she experiences pain in her lower back on the left side, radiating to her side and down her leg. The pain worsens with walking and standing, significantly affecting her mobility. She has been using a rollator for the past few weeks due to the pain, having previously used a cane.  She has tried various pain medications including oxycodone  and Tylenol  #3. Oxycodone  initially relieved her pain but caused stomach upset when taken on an empty stomach, so she now takes it with food to avoid nausea. However, it no longer alleviates her pain. Tylenol  #3 makes her sleepy but does not relieve her pain. She has also used topical treatments such as Biofreeze patches, which provide only temporary relief and heat packs.   Ms. Vitelli recently restarted half tablet of oxycodone  due to intensity of pain and minimal relief with Tylenol  #3. I suggested continuing use of oxycodone  0.5-1 tablet as needed however patient expresses not liking how it made her feel. She would like to try another option if possible. Education provided on the use of Tramadol  as needed in addition to muscle relaxer. Patient advised on administration timing, efficacy, and potential side effects. She and family verbalized understanding.   We will follow up closely for symptom management. Can consider low dose gabapentin if needed.   Goals of Care We discussed her current illness and what it means in the larger context of her on-going co-morbidities. Natural disease trajectory and expectations were discussed. Ms. Forsee and her granddaughter are realistic in their understanding of her terminal cancer with no further treatment options.   She is clear in expressed wishes to continue to treat the treatable and aggressively manage her symptoms at this time. I  empathetically approached discussions regarding healthcare limitations.   Education provided on the differences, goals and philosophy, and what care would look like with palliative versus hospice. Ms. Yates is clear that she is not interested in pursuing hospice care at this time. She shares she almost did not want to  come to her appointment today with myself out of fear she was being enrolled in hospice. I had open discussion with patient sharing at a point hospice would be appropriate for her care to allow her to spend what time she has left comfortable amongst her family. She verbalized understanding and appreciation.   Patient is a DNR/DNI as confirmed. We will continue ongoing goals of care discussions as needed.   I discussed the importance of continued conversation with family and their medical providers regarding overall plan of care and treatment options, ensuring decisions are within the context of the patients values and GOCs.  Assessment & Plan Cancer related pain in lower back and leg Pain exacerbated by activity. Previous medications (oxycodone ) caused sedation. Current management (tylenol  #3) provides temporary relief. Muscle spasms may contribute. Tramadol  and mild muscle relaxer considered for less sedation. - Prescribe tramadol  50 mg, take one tablet every 6-8 hours as needed, option to start with half a tablet. - Prescribe mild muscle relaxer, Baclofen, take one tablet every 8 hours as needed, option to start with half a tablet. - Advise not to take tramadol  and muscle relaxer simultaneously; space 2 hours apart. - Instruct to take medications with food. - Call next day to assess response to new medications.  Port management Concerns about port placement and needle insertion pain. Recent issues with home health nurse technique. - Have nurse assess and redress port site for proper placement and functionality. Abbie Abbey, RN redressed site and adjusted tubing.   Goals of  Care Patient and family expressed clear understanding of terminal pancreatic cancer. They are currently not interested in enrolling in hospice.  -Goals clear to continue treating the treatable allowing patient an opportunity to continue thriving while aggressively managing symptoms.  -DNR/DNI -Direct discussion regarding need for hospice in the future as health further declines.  -We will continue goals of care discussions offering support to patient and family as they navigate complex decisions.   I will plan to follow-up with patient by phone next week and in person in 2-3 weeks. Sooner if needed.   Patient expressed understanding and was in agreement with this plan. She also understands that She can call the clinic at any time with any questions, concerns, or complaints.   Thank you for your referral and allowing Palliative to assist in Ms. Bula P Seminara's care.   Number and complexity of problems addressed: HIGH - 1 or more chronic illnesses with SEVERE exacerbation, progression, or side effects of treatment - advanced cancer, pain. Any controlled substances utilized were prescribed in the context of palliative care.  Visit consisted of counseling and education dealing with the complex and emotionally intense issues of symptom management and palliative care in the setting of serious and potentially life-threatening illness.  Signed by: Dellia Ferguson, AGPCNP-BC Palliative Medicine Team/ [facility]

## 2024-03-12 ENCOUNTER — Other Ambulatory Visit: Payer: Self-pay

## 2024-03-13 ENCOUNTER — Other Ambulatory Visit: Payer: Self-pay

## 2024-03-13 DIAGNOSIS — C250 Malignant neoplasm of head of pancreas: Secondary | ICD-10-CM

## 2024-03-13 MED ORDER — LOPERAMIDE HCL 2 MG PO CAPS: 2.0000 mg | ORAL_CAPSULE | ORAL | 1 refills | Status: DC | PRN

## 2024-03-14 DIAGNOSIS — C250 Malignant neoplasm of head of pancreas: Secondary | ICD-10-CM | POA: Diagnosis not present

## 2024-03-14 DIAGNOSIS — I38 Endocarditis, valve unspecified: Secondary | ICD-10-CM | POA: Diagnosis not present

## 2024-03-16 ENCOUNTER — Ambulatory Visit (INDEPENDENT_AMBULATORY_CARE_PROVIDER_SITE_OTHER): Admitting: Endocrinology

## 2024-03-16 ENCOUNTER — Encounter: Payer: Self-pay | Admitting: Endocrinology

## 2024-03-16 VITALS — BP 138/70 | HR 91 | Resp 16 | Ht 63.0 in | Wt 146.4 lb

## 2024-03-16 DIAGNOSIS — E1169 Type 2 diabetes mellitus with other specified complication: Secondary | ICD-10-CM | POA: Diagnosis not present

## 2024-03-16 DIAGNOSIS — A419 Sepsis, unspecified organism: Secondary | ICD-10-CM | POA: Diagnosis not present

## 2024-03-16 DIAGNOSIS — I21A1 Myocardial infarction type 2: Secondary | ICD-10-CM | POA: Diagnosis not present

## 2024-03-16 DIAGNOSIS — E042 Nontoxic multinodular goiter: Secondary | ICD-10-CM | POA: Diagnosis not present

## 2024-03-16 DIAGNOSIS — Z7984 Long term (current) use of oral hypoglycemic drugs: Secondary | ICD-10-CM

## 2024-03-16 DIAGNOSIS — I480 Paroxysmal atrial fibrillation: Secondary | ICD-10-CM | POA: Diagnosis not present

## 2024-03-16 DIAGNOSIS — I5033 Acute on chronic diastolic (congestive) heart failure: Secondary | ICD-10-CM | POA: Diagnosis not present

## 2024-03-16 DIAGNOSIS — E1165 Type 2 diabetes mellitus with hyperglycemia: Secondary | ICD-10-CM | POA: Diagnosis not present

## 2024-03-16 DIAGNOSIS — J189 Pneumonia, unspecified organism: Secondary | ICD-10-CM | POA: Diagnosis not present

## 2024-03-16 DIAGNOSIS — J9601 Acute respiratory failure with hypoxia: Secondary | ICD-10-CM | POA: Diagnosis not present

## 2024-03-16 DIAGNOSIS — Z8673 Personal history of transient ischemic attack (TIA), and cerebral infarction without residual deficits: Secondary | ICD-10-CM | POA: Diagnosis not present

## 2024-03-16 DIAGNOSIS — I110 Hypertensive heart disease with heart failure: Secondary | ICD-10-CM | POA: Diagnosis not present

## 2024-03-16 NOTE — Progress Notes (Signed)
 Outpatient Endocrinology Note [place] Adaiah Morken, MD  03/16/24  Patient's Name: Janet Williamson    DOB: [birth year]    MRN: [id number]                                                    REASON OF VISIT: Follow up for type 2 diabetes mellitus  PCP: Benedetto Brady, MD  HISTORY OF PRESENT ILLNESS:   Janet Williamson is a 83 y.o. old female with past medical history listed below, is here for follow up for type 2 diabetes mellitus /multinodular goiter.   Pertinent Diabetes History: Patient was diagnosed with type 2 diabetes mellitus in 2013.  She has relatively well-controlled type 2 diabetes mellitus controlled on metformin  extended release.  Chronic Diabetes Complications : Retinopathy: no. Last ophthalmology exam was done annually reportedly. Nephropathy: no, She had swelling of the lips with ramipril  and has not been prescribed an ARB /ACE drug  Peripheral neuropathy: no Coronary artery disease: no Stroke: stroke  Relevant comorbidities and cardiovascular risk factors: Obesity: no Body mass index is 25.93 kg/m.  Hypertension: yes Hyperlipidemia. Yes, on a statin.  Current / Home Diabetic regimen includes: Metformin  extended release 750 mg 2 tablets daily.  Prior diabetic medications:  Glycemic data:   Forget to bring glucometer in the clinic today.  Patient reports checking blood sugar daily in the morning and mostly in the low 100 range, this morning was 111.   Hypoglycemia: Patient has no hypoglycemic episodes. Patient has hypoglycemia awareness.  Factors modifying glucose control: 1.  Diabetic diet assessment: 3 meals a day.  Sometimes ice cream.  2.  Staying active or exercising: Not able to exercise due to musculoskeletal pain.  3.  Medication compliance: compliant all of the time.  # Multinodular goiter longstanding.  Last ultrasound was in August 2013, showed largest nodule to be 4.1 cm in the isthmus.  She has occasional neck discomfort and feeling of choking but no  difficulty swallowing and has remained stable for several years.  # Hypercalcemia : She has occasional high serum calcium  however has remained upper normal recently.  She does take hydrochlorothiazide  for hypertension.  PTH was low normal previously. Upper normal calcium  levels without hyperparathyroidism, stable and may be related to hydrochlorothiazide .  Interval history Patient has been following with oncology, reviewed office note and no plans for pancreatic surgery, also following with palliative care.  She reports blood sugar in the morning in low 100s.  Denies any GI issues related to the metformin .  She is currently taking metformin .  No other complaints today.  Patient is accompanied by daughters in the clinic today.   REVIEW OF SYSTEMS As per history of present illness.   PAST MEDICAL HISTORY: 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 Not seen on most recent TEE 2019.   Cardiomyopathy in other disease    Cataracts, bilateral    Diabetes mellitus    Type 2   Generalized osteoarthritis    GERD (gastroesophageal reflux disease)    HLD (hyperlipidemia)    Hypertension    Hypothyroidism    LBBB (left bundle branch block)    PAF (paroxysmal atrial fibrillation) (HCC)    a. 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 s/p TAVR 04/2018.   Stroke [facility]) 2008    PAST SURGICAL HISTORY: Past Surgical History:  Procedure Laterality Date   ABDOMINAL HYSTERECTOMY     BILIARY BRUSHING  10/16/2023   Procedure: BILIARY BRUSHING;  Surgeon: Evangeline Hilts, MD;  Location: [name] ENDOSCOPY;  Service: Gastroenterology;;   BILIARY STENT PLACEMENT N/A 10/16/2023   Procedure: BILIARY STENT PLACEMENT;  Surgeon: Evangeline Hilts, MD;  Location: [facility] ENDOSCOPY;  Service: Gastroenterology;  Laterality: N/A;   BIOPSY  05/19/2019   Procedure: BIOPSY;  Surgeon: Lanita Pitman, MD;  Location: [facility] ENDOSCOPY;  Service: Endoscopy;;   BREAST BIOPSY Left    years ago at "a doctor's office"   COLONOSCOPY     COLONOSCOPY WITH PROPOFOL  N/A 10/02/2018   Procedure: COLONOSCOPY WITH PROPOFOL ;  Surgeon: Evangeline Hilts, MD;  Location: [facility] ENDOSCOPY;  Service: Endoscopy;  Laterality: N/A;   ENTEROSCOPY N/A 05/19/2019   Procedure: ENTEROSCOPY;  Surgeon: Lanita Pitman, MD;  Location: [facility] ENDOSCOPY;  Service: Endoscopy;  Laterality: N/A;   ERCP N/A 10/16/2023   Procedure: ENDOSCOPIC RETROGRADE CHOLANGIOPANCREATOGRAPHY (ERCP);  Surgeon: Evangeline Hilts, MD;  Location: [name] ENDOSCOPY;  Service: Gastroenterology;  Laterality: N/A;   ESOPHAGOGASTRODUODENOSCOPY (EGD) WITH PROPOFOL  N/A 10/02/2018   Procedure: ESOPHAGOGASTRODUODENOSCOPY (EGD) WITH PROPOFOL ;  Surgeon: Evangeline Hilts, MD;  Location: [facility] ENDOSCOPY;  Service: Endoscopy;  Laterality: N/A;   ESOPHAGOGASTRODUODENOSCOPY (EGD) WITH PROPOFOL  N/A 04/23/2019   Procedure: ESOPHAGOGASTRODUODENOSCOPY (EGD) WITH PROPOFOL ;  Surgeon: Celedonio Coil, MD;  Location: [facility] ENDOSCOPY;  Service: Endoscopy;  Laterality: N/A;   ESOPHAGOGASTRODUODENOSCOPY (EGD) WITH PROPOFOL  N/A 10/16/2023   Procedure: ESOPHAGOGASTRODUODENOSCOPY (EGD) WITH PROPOFOL ;  Surgeon: Evangeline Hilts, MD;  Location: [facility] ENDOSCOPY;  Service: Gastroenterology;  Laterality: N/A;   EUS N/A 10/16/2023   Procedure: FULL UPPER ENDOSCOPIC ULTRASOUND (EUS) RADIAL;  Surgeon: Evangeline Hilts, MD;  Location: [facility] ENDOSCOPY;  Service: Gastroenterology;  Laterality: N/A;   EYE SURGERY Bilateral    cataract removal   FINE NEEDLE ASPIRATION N/A 10/16/2023   Procedure: FINE NEEDLE ASPIRATION (FNA) LINEAR;  Surgeon: Evangeline Hilts, MD;  Location: [facility] ENDOSCOPY;  Service: Gastroenterology;  Laterality: N/A;   GIVENS CAPSULE STUDY N/A 10/02/2018   Procedure:  GIVENS CAPSULE STUDY;  Surgeon: Evangeline Hilts, MD;  Location: [facility] ENDOSCOPY;  Service: Endoscopy;  Laterality: N/A;   IR IMAGING GUIDED PORT INSERTION  01/10/2024   RIGHT/LEFT HEART CATH AND CORONARY ANGIOGRAPHY N/A 03/06/2018   Procedure: RIGHT/LEFT HEART CATH AND CORONARY ANGIOGRAPHY;  Surgeon: Arty Binning, MD;  Location: [facility] INVASIVE CV LAB;  Service: Cardiovascular;  Laterality: N/A;   SMALL BOWEL ENTEROSCOPY  05/19/2019   SPHINCTEROTOMY  10/16/2023   Procedure: SPHINCTEROTOMY;  Surgeon: Evangeline Hilts, MD;  Location: [facility] ENDOSCOPY;  Service: Gastroenterology;;   TEE WITHOUT CARDIOVERSION N/A 04/01/2018   Procedure: TRANSESOPHAGEAL ECHOCARDIOGRAM (TEE);  Surgeon: Hugh Madura, MD;  Location: [facility] ENDOSCOPY;  Service: Cardiovascular;  Laterality: N/A;   TEE WITHOUT CARDIOVERSION N/A 04/15/2018   Procedure: TRANSESOPHAGEAL ECHOCARDIOGRAM (TEE);  Surgeon: Arnoldo Lapping, MD;  Location: [facility] OR;  Service: Open Heart Surgery;  Laterality: N/A;   TOTAL KNEE ARTHROPLASTY Right 04/14/2013   Dr Rozelle Corning   TOTAL KNEE ARTHROPLASTY Right 04/14/2013   Procedure: TOTAL KNEE ARTHROPLASTY;  Surgeon: Jasmine Mesi, MD;  [facility] OR;  Service: Orthopedics;  Laterality: Right;   TRANSCATHETER AORTIC VALVE REPLACEMENT, TRANSFEMORAL  04/15/2018   TRANSCATHETER AORTIC VALVE REPLACEMENT, TRANSFEMORAL Bilateral 04/15/2018   Procedure: TRANSCATHETER AORTIC VALVE REPLACEMENT, TRANSFEMORAL;  Surgeon: Arnoldo Lapping, MD;  Location: [facility] OR;  Service: Open Heart Surgery;  Laterality: Bilateral;    ALLERGIES: Allergies  Allergen Reactions   Ace Inhibitors Swelling and Other (See Comments)    Angioedema    Keflex [Cephalexin] Swelling and Other (See Comments)    Lips became swollen   Rifadin [Rifampin] Swelling and Other (See Comments)    Lips became swollen    FAMILY HISTORY:  Family History  Problem Relation Age of Onset   Stroke Mother    Hypertension Mother    Hypertension Father    Heart attack Neg Hx      SOCIAL HISTORY: Social History   Socioeconomic History   Marital status: Widowed    Spouse name: Not on file   Number of children: Not on file   Years of education: Not on file   Highest education level: Not on file  Occupational History   Not on file  Tobacco Use   Smoking status: Never   Smokeless tobacco: Never  Vaping Use   Vaping status: Never Used  Substance and Sexual Activity   Alcohol use: No   Drug use: No   Sexual activity: Not Currently  Other Topics Concern   Not on file  Social History Narrative   Not on file   Social Drivers of Health   Financial Resource Strain: Not on file  Food Insecurity: No Food Insecurity (02/14/2024)   Hunger Vital Sign    Worried About Running Out of Food in the Last Year: Never true    Ran Out of Food in the Last Year: Never true  Transportation Needs: No Transportation Needs (02/14/2024)   PRAPARE - Administrator, Civil Service (Medical): No    Lack of Transportation (Non-Medical): No  Physical Activity: Not on file  Stress: Not on file  Social Connections: Unknown (02/14/2024)   Social Connection and Isolation Panel [NHANES]    Frequency of Communication with Friends and Family: More than three times a week    Frequency of Social Gatherings with Friends and Family: Three times a week    Attends Religious Services: 1 to 4 times per year    Active Member of Clubs or Organizations: Yes    Attends Banker Meetings: 1 to 4 times per year    Marital Status: Patient declined    MEDICATIONS:  Current Outpatient Medications  Medication Sig Dispense Refill   acetaminophen  (TYLENOL ) 325 MG tablet Take 325-650 mg by mouth every 8 (eight) hours as needed (for headaches).     acetaminophen -codeine  (TYLENOL  #3) 300-30 MG tablet Take 1 tablet by mouth every 8 (eight) hours as needed for moderate pain (pain score 4-6). 30 tablet 0   amLODipine  (NORVASC ) 5 MG tablet Take 5 mg by mouth daily.     baclofen   (LIORESAL ) 10 MG tablet Take 1 tablet (10 mg total) by mouth 3 (three) times daily. 30 each 0   Blood Glucose Monitoring Suppl (ACCU-CHEK GUIDE ME) w/Device KIT by Does not apply route.     Blood Glucose Monitoring Suppl (ONE TOUCH ULTRA 2) w/Device KIT Use to check blood sugars three times daily DX: E11.9 1 kit 0   cefTRIAXone  (ROCEPHIN ) IVPB Inject 2 g into the vein daily. 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 36 Units 0   Cholecalciferol  (VITAMIN D3) 5000 units CAPS Take 5,000 Units by mouth daily.     CREON  36000-114000 units CPEP capsule TAKE TWO (2) CAPSULES BY MOUTH THREE TIMES A DAY WITH MEALS TAKE 1 CAPSULE BY MOUTH AS NEEDED WITH SNACK. MAX 10/DAY *NEW PRESCRIPTION REQUEST* 900 capsule 11   daptomycin  (CUBICIN ) IVPB Inject 500 mg into the vein daily. 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 36 Units 0   furosemide  (LASIX ) 20 MG tablet Take 1 tablet (20 mg total) by mouth daily. Can take an additional tablet as needed for weight gain     Lancets (ONETOUCH DELICA PLUS LANCET33G) MISC TEST BLOOD SUGAR THREE TIMES DAILY 300 each 10   lidocaine -prilocaine  (EMLA ) cream APPLY A QUARTER SIZE AMOUNT OF CREAM TO PORT - A- CATH 45 MINUTES TO 1 HOUR PRIOR TO PORT-A-CATH BEING  ACCESS. *NEW PRESCRIPTION REQUEST* 90 g 11   loperamide  (IMODIUM ) 2 MG capsule Take 1 capsule (2 mg total) by mouth as needed for diarrhea or loose stools. 30 capsule 1   metFORMIN  (GLUCOPHAGE -XR) 750 MG 24 hr tablet TAKE 2 TABLETS ONE TIME DAILY WITH BREAKFAST 180 tablet 2   metoprolol  tartrate (LOPRESSOR ) 25 MG tablet Take 1 tablet (25 mg total) by mouth 2 (two) times daily. 60 tablet 3   ONETOUCH ULTRA TEST test strip TEST BLOOD SUGAR THREE TIMES DAILY 300 strip 3   pantoprazole  (PROTONIX ) 20 MG tablet TAKE 1 TABLET BY MOUTH DAILY *NEW PRESCRIPTION REQUEST* 90 tablet 11   Polyethyl Glycol-Propyl Glycol (SYSTANE OP) Place 1 drop into both eyes 2 (two) times daily.      rivaroxaban  (XARELTO ) 20 MG TABS tablet Take 1 tablet (20 mg total) by mouth daily with supper. (Patient taking differently: Take 20 mg by mouth every morning.) 30 tablet 0   traMADol  (ULTRAM ) 50 MG tablet Take 0.5-1 tablets (25-50 mg total) by mouth every 6 (six) hours as needed. 30 tablet 0   No current facility-administered medications for this visit.   Facility-Administered Medications Ordered in Other Visits  Medication Dose Route Frequency Provider Last Rate Last Admin   0.9 %  sodium chloride  infusion   Intravenous Continuous Sonja [place], MD   Stopped at 12/11/23 1305   0.9 %  sodium chloride  infusion   Intravenous Continuous Sonja Stockton, MD   Stopped at 12/11/23 1304    PHYSICAL EXAM: Vitals:   03/16/24 0912  BP: 138/70  Pulse: 91  Resp: 16  SpO2: 97%  Weight: 146 lb 6.4 oz (66.4 kg)  Height: 5\' 3"  (1.6 m)     Body mass index is 25.93 kg/m.  Wt Readings from Last 3 Encounters:  03/16/24 146 lb 6.4 oz (66.4 kg)  03/06/24 145 lb (65.8 kg)  03/04/24 146 lb 4.8 oz (66.4 kg)    General: Well developed, well nourished female in no apparent distress.  HEENT: AT/, no external lesions.  Eyes: Conjunctiva clear and no icterus. Neck: Neck supple.  Thyromegaly with isthmus nodule about 3 cm, mobile soft, nontender  present. Lungs: Respirations not labored Neurologic: Alert, oriented, normal speech Extremities / Skin: Dry.  Psychiatric: Does not appear depressed or anxious  Diabetic Foot Exam - Simple   No data filed    LABS Reviewed Lab Results  Component Value Date   HGBA1C 7.1 (A) 01/13/2024   HGBA1C 7.6 (A) 07/15/2023   HGBA1C 6.8 (H) 01/15/2023   Lab Results  Component Value Date   FRUCTOSAMINE 250 12/03/2019   FRUCTOSAMINE 247 08/03/2019   Lab Results  Component Value Date   CHOL 137 01/07/2024   HDL 42 (L) 01/07/2024   LDLCALC 78 01/07/2024   TRIG 91 01/07/2024   CHOLHDL 3.3 01/07/2024   Lab Results  Component Value Date   MICRALBCREAT 5 01/07/2024   MICRALBCREAT 4.0 01/15/2023   Lab Results  Component Value Date   CREATININE 0.63 03/04/2024   Lab Results  Component Value Date   GFR 70.90 01/15/2023    ASSESSMENT / PLAN  1. Type 2 diabetes mellitus with other specified complication, without long-term current use of insulin  (HCC)   2. Multinodular goiter      Diabetes Mellitus type 2, complicated by stroke. - Diabetic status / severity: Fair control.  Lab Results  Component Value Date   HGBA1C 7.1 (A) 01/13/2024    - Hemoglobin A1c goal : <7.5%  Patient is asked to call our clinic if she required any steroids in the future with the chemotherapy treatment, she needs additional antidiabetic medication most likely insulin  therapy.  From the note from oncology, no plan for pancreatic surgery.  For now as blood sugars are acceptable.  Will stay on the current regimen.  Goal blood sugar should be 100-200 range.  Patient is asked to call our clinic if there is high blood sugar.  - Medications: No change.  I) continue metformin  extended release 750 mg 2 tablets daily.  - Home glucose testing: At least in the morning fasting, and occasionally at bedtime. - Discussed/ Gave Hypoglycemia treatment plan.  # Consult : not required at this time.   # Annual urine  for microalbuminuria/ creatinine ratio, no microalbuminuria currently. Last  Lab Results  Component Value Date   MICRALBCREAT 5 01/07/2024    # Foot check nightly / neuropathy.  # Annual dilated diabetic eye exams.   - Diet: Make healthy diabetic food choices - Life style / activity / exercise: Discussed.  2. Blood pressure  -  BP Readings from Last 1 Encounters:  03/16/24 138/70    - Control is in target.  - No change in current plans.   3. Lipid status / Hyperlipidemia - Last  Lab Results  Component Value Date   LDLCALC 78 01/07/2024   - Continue simvastatin  20 mg daily.  # Multinodular goiter : Longstanding.  Last ultrasound in 2013 with largest nodule measuring 4.1 cm in the isthmus. -Will continue to monitor. ?  Consider to repeat ultrasound thyroid  in the future. -Annual thyroid  lab.  She is euthyroid and not on thyroid  medication.  She had normal thyroid  function test with TSH of 1.189 in November 2024.   Diagnoses and all orders for this visit:  Type 2 diabetes mellitus with other specified complication, without long-term current use of insulin  (HCC)  Multinodular goiter    DISPOSITION Follow up in clinic in 3 months suggested.   All questions  answered and patient verbalized understanding of the plan.  [place] Earlena Werst, MD [facility] Endocrinology [facility] [address] [place], [address] [place], [place] [address] Phone # [phone]  At least part of this note was generated using voice recognition software. Inadvertent word errors may have occurred, which were not recognized during the proofreading process.

## 2024-03-19 DIAGNOSIS — E1165 Type 2 diabetes mellitus with hyperglycemia: Secondary | ICD-10-CM | POA: Diagnosis not present

## 2024-03-19 DIAGNOSIS — J9601 Acute respiratory failure with hypoxia: Secondary | ICD-10-CM | POA: Diagnosis not present

## 2024-03-19 DIAGNOSIS — I21A1 Myocardial infarction type 2: Secondary | ICD-10-CM | POA: Diagnosis not present

## 2024-03-19 DIAGNOSIS — Z8673 Personal history of transient ischemic attack (TIA), and cerebral infarction without residual deficits: Secondary | ICD-10-CM | POA: Diagnosis not present

## 2024-03-19 DIAGNOSIS — I110 Hypertensive heart disease with heart failure: Secondary | ICD-10-CM | POA: Diagnosis not present

## 2024-03-19 DIAGNOSIS — I480 Paroxysmal atrial fibrillation: Secondary | ICD-10-CM | POA: Diagnosis not present

## 2024-03-19 DIAGNOSIS — A419 Sepsis, unspecified organism: Secondary | ICD-10-CM | POA: Diagnosis not present

## 2024-03-19 DIAGNOSIS — I5033 Acute on chronic diastolic (congestive) heart failure: Secondary | ICD-10-CM | POA: Diagnosis not present

## 2024-03-19 DIAGNOSIS — J189 Pneumonia, unspecified organism: Secondary | ICD-10-CM | POA: Diagnosis not present

## 2024-03-19 DIAGNOSIS — R17 Unspecified jaundice: Secondary | ICD-10-CM | POA: Diagnosis not present

## 2024-03-21 DIAGNOSIS — C250 Malignant neoplasm of head of pancreas: Secondary | ICD-10-CM | POA: Diagnosis not present

## 2024-03-21 DIAGNOSIS — I38 Endocarditis, valve unspecified: Secondary | ICD-10-CM | POA: Diagnosis not present

## 2024-03-23 ENCOUNTER — Other Ambulatory Visit: Payer: Self-pay | Admitting: Physician Assistant

## 2024-03-23 DIAGNOSIS — I480 Paroxysmal atrial fibrillation: Secondary | ICD-10-CM | POA: Diagnosis not present

## 2024-03-23 DIAGNOSIS — I351 Nonrheumatic aortic (valve) insufficiency: Secondary | ICD-10-CM

## 2024-03-23 DIAGNOSIS — J189 Pneumonia, unspecified organism: Secondary | ICD-10-CM | POA: Diagnosis not present

## 2024-03-23 DIAGNOSIS — J9601 Acute respiratory failure with hypoxia: Secondary | ICD-10-CM | POA: Diagnosis not present

## 2024-03-23 DIAGNOSIS — E1165 Type 2 diabetes mellitus with hyperglycemia: Secondary | ICD-10-CM | POA: Diagnosis not present

## 2024-03-23 DIAGNOSIS — I5033 Acute on chronic diastolic (congestive) heart failure: Secondary | ICD-10-CM | POA: Diagnosis not present

## 2024-03-23 DIAGNOSIS — I21A1 Myocardial infarction type 2: Secondary | ICD-10-CM | POA: Diagnosis not present

## 2024-03-23 DIAGNOSIS — A419 Sepsis, unspecified organism: Secondary | ICD-10-CM | POA: Diagnosis not present

## 2024-03-23 DIAGNOSIS — Z8673 Personal history of transient ischemic attack (TIA), and cerebral infarction without residual deficits: Secondary | ICD-10-CM | POA: Diagnosis not present

## 2024-03-23 DIAGNOSIS — Z3183 Encounter for assisted reproductive fertility procedure cycle: Secondary | ICD-10-CM | POA: Diagnosis not present

## 2024-03-23 DIAGNOSIS — I110 Hypertensive heart disease with heart failure: Secondary | ICD-10-CM | POA: Diagnosis not present

## 2024-03-23 DIAGNOSIS — Z952 Presence of prosthetic heart valve: Secondary | ICD-10-CM

## 2024-03-25 ENCOUNTER — Ambulatory Visit (HOSPITAL_COMMUNITY)
Admission: RE | Admit: 2024-03-25 | Discharge: 2024-03-25 | Disposition: A | Discharge status: Home / Self Care | Source: Ambulatory Visit | Attending: Cardiology | Admitting: Cardiology

## 2024-03-25 ENCOUNTER — Other Ambulatory Visit: Payer: Self-pay

## 2024-03-25 ENCOUNTER — Encounter: Payer: Self-pay | Admitting: Internal Medicine

## 2024-03-25 ENCOUNTER — Ambulatory Visit (INDEPENDENT_AMBULATORY_CARE_PROVIDER_SITE_OTHER): Admitting: Internal Medicine

## 2024-03-25 VITALS — BP 151/66 | HR 63 | Temp 97.3°F | Wt 145.0 lb

## 2024-03-25 DIAGNOSIS — I351 Nonrheumatic aortic (valve) insufficiency: Secondary | ICD-10-CM

## 2024-03-25 DIAGNOSIS — I330 Acute and subacute infective endocarditis: Secondary | ICD-10-CM

## 2024-03-25 DIAGNOSIS — Z952 Presence of prosthetic heart valve: Secondary | ICD-10-CM | POA: Diagnosis not present

## 2024-03-25 LAB — ECHOCARDIOGRAM COMPLETE
AR max vel: 0.87 cm2
AV Area VTI: 0.92 cm2
AV Area mean vel: 0.92 cm2
AV Mean grad: 57 mmHg
AV Peak grad: 104 mmHg
Ao pk vel: 5.1 m/s
Area-P 1/2: 2.99 cm2
MV M vel: 7.13 m/s
MV Peak grad: 203.3 mmHg
MV VTI: 0.05 cm2
P 1/2 time: 262 ms
Radius: 0.9 cm
S' Lateral: 2.6 cm

## 2024-03-25 NOTE — Patient Instructions (Signed)
 Finish antibiotics today  Your echo is pending read  Follow up with cardiology for monitoring of your TAVR function  See us  again in around 4 weeks to make sure you are doing ok from infection standpoint off antibiotics

## 2024-03-25 NOTE — Progress Notes (Signed)
 OPAT EOT today 6/18. Notified Kay Parson, RN with [facility] and [facility] pharmacy team. Patient has port that will remain in place per oncology.   Harley Fitzwater, BSN, RN

## 2024-03-25 NOTE — Progress Notes (Signed)
 [facility]  Patient Active Problem List   Diagnosis Date Noted   Acute on chronic heart failure with preserved ejection fraction (HFpEF) (HCC) 02/15/2024   Stenosis of prosthetic aortic valve 02/15/2024   Port-A-Cath in place 01/15/2024   Genetic testing 12/04/2023   Malignant neoplasm of head of pancreas (HCC) 10/24/2023   History of GI bleed 04/21/2019   Symptomatic anemia 09/30/2018   S/P TAVR (transcatheter aortic valve replacement) 04/15/2018   Severe aortic stenosis    PAF (paroxysmal atrial fibrillation) (HCC) 03/04/2018   History of stroke 11/03/2014   Hypertension    DMII (diabetes mellitus, type 2) (HCC) 05/06/2013   HTN (hypertension) 05/06/2013   Anemia 05/06/2013      Subjective:    Patient ID: Janet Williamson, female    DOB: November 08, 1940, 83 y.o.   MRN: [id number]  Chief Complaint  Patient presents with   Follow-up    HPI:  Janet Williamson is a 83 y.o. female here for f/u endocarditis   03/25/24 see a&p for detail No f/c No sob/cough At baseline health    Allergies  Allergen Reactions   Ace Inhibitors Swelling and Other (See Comments)    Angioedema    Keflex [Cephalexin] Swelling and Other (See Comments)    Lips became swollen   Rifadin [Rifampin] Swelling and Other (See Comments)    Lips became swollen      Outpatient Medications Prior to Visit  Medication Sig Dispense Refill   acetaminophen  (TYLENOL ) 325 MG tablet Take 325-650 mg by mouth every 8 (eight) hours as needed (for headaches).     amLODipine  (NORVASC ) 5 MG tablet Take 5 mg by mouth daily.     baclofen  (LIORESAL ) 10 MG tablet Take 1 tablet (10 mg total) by mouth 3 (three) times daily. 30 each 0   Blood Glucose Monitoring Suppl (ACCU-CHEK GUIDE ME) w/Device KIT by Does not apply route.     Blood Glucose Monitoring Suppl (ONE TOUCH ULTRA 2) w/Device KIT Use to check blood sugars three times daily DX: E11.9 1 kit 0   cefTRIAXone  (ROCEPHIN )  IVPB Inject 2 g into the vein daily. 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 36 Units 0   Cholecalciferol  (VITAMIN D3) 5000 units CAPS Take 5,000 Units by mouth daily.     CREON  36000-114000 units CPEP capsule TAKE TWO (2) CAPSULES BY MOUTH THREE TIMES A DAY WITH MEALS TAKE 1 CAPSULE BY MOUTH AS NEEDED WITH SNACK. MAX 10/DAY *NEW PRESCRIPTION REQUEST* 900 capsule 11   daptomycin  (CUBICIN ) IVPB Inject 500 mg into the vein daily. 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 36 Units 0   furosemide  (LASIX ) 20 MG tablet Take 1 tablet (20 mg total) by mouth daily. Can take an additional tablet as needed for weight gain  Lancets (ONETOUCH DELICA PLUS LANCET33G) MISC TEST BLOOD SUGAR THREE TIMES DAILY 300 each 10   lidocaine -prilocaine  (EMLA ) cream APPLY A QUARTER SIZE AMOUNT OF CREAM TO PORT - A- CATH 45 MINUTES TO 1 HOUR PRIOR TO PORT-A-CATH BEING ACCESS. *NEW PRESCRIPTION REQUEST* 90 g 11   loperamide  (IMODIUM ) 2 MG capsule Take 1 capsule (2 mg total) by mouth as needed for diarrhea or loose stools. 30 capsule 1   metFORMIN  (GLUCOPHAGE -XR) 750 MG 24 hr tablet TAKE 2 TABLETS ONE TIME DAILY WITH BREAKFAST 180 tablet 2   metoprolol  tartrate (LOPRESSOR ) 25 MG tablet Take 1 tablet (25 mg  total) by mouth 2 (two) times daily. 60 tablet 3   ONETOUCH ULTRA TEST test strip TEST BLOOD SUGAR THREE TIMES DAILY 300 strip 3   pantoprazole  (PROTONIX ) 20 MG tablet TAKE 1 TABLET BY MOUTH DAILY *NEW PRESCRIPTION REQUEST* 90 tablet 11   Polyethyl Glycol-Propyl Glycol (SYSTANE OP) Place 1 drop into both eyes 2 (two) times daily.      rivaroxaban  (XARELTO ) 20 MG TABS tablet Take 1 tablet (20 mg total) by mouth daily with supper. 30 tablet 0   traMADol  (ULTRAM ) 50 MG tablet Take 0.5-1 tablets (25-50 mg total) by mouth every 6 (six) hours as needed. 30 tablet 0   acetaminophen -codeine  (TYLENOL  #3) 300-30 MG tablet Take 1 tablet by mouth every 8 (eight) hours as needed for moderate pain (pain score 4-6). (Patient not taking: Reported on 03/25/2024) 30 tablet 0   Facility-Administered Medications Prior to Visit  Medication Dose Route Frequency Provider Last Rate Last Admin   0.9 %  sodium chloride  infusion   Intravenous Continuous Sonja Lomas, MD   Stopped at 12/11/23 1305   0.9 %  sodium chloride  infusion   Intravenous Continuous Sonja [place], MD   Stopped at 12/11/23 1304     Social History   Socioeconomic History   Marital status: Widowed    Spouse name: Not on file   Number of children: Not on file   Years of education: Not on file   Highest education level: Not on file  Occupational History   Not on file  Tobacco Use   Smoking status: Never   Smokeless tobacco: Never  Vaping Use   Vaping status: Never Used  Substance and Sexual Activity   Alcohol use: No   Drug use: No   Sexual activity: Not Currently  Other Topics Concern   Not on file  Social History Narrative   Not on file   Social Drivers of Health   Financial Resource Strain: Not on file  Food Insecurity: No Food Insecurity (02/14/2024)   Hunger Vital Sign    Worried About Running Out of Food in the Last Year: Never true    Ran Out of Food in the Last Year: Never true  Transportation Needs: No Transportation Needs  (02/14/2024)   PRAPARE - Administrator, Civil Service (Medical): No    Lack of Transportation (Non-Medical): No  Physical Activity: Not on file  Stress: Not on file  Social Connections: Unknown (02/14/2024)   Social Connection and Isolation Panel    Frequency of Communication with Friends and Family: More than three times a week    Frequency of Social Gatherings with Friends and Family: Three times a week    Attends Religious Services: 1 to 4 times per year    Active Member of Clubs or Organizations: Yes    Attends Banker Meetings: 1 to 4 times per year  Marital Status: Patient declined  Intimate Partner Violence: Not At Risk (02/14/2024)   Humiliation, Afraid, Rape, and Kick questionnaire    Fear of Current or Ex-Partner: No    Emotionally Abused: No    Physically Abused: No    Sexually Abused: No      Review of Systems    All other ros negative  Objective:    BP (!) 151/66   Pulse 63   Temp (!) 97.3 F (36.3 C) (Temporal)   Wt 145 lb (65.8 kg)   SpO2 99%   BMI 25.69 kg/m  Nursing note and vital signs reviewed.  Physical Exam      General/constitutional: no distress, pleasant; walks with walker HEENT: Normocephalic, PER, Conj Clear, EOMI, Oropharynx clear Neck supple CV: rrr no mrg Lungs: clear to auscultation, normal respiratory effort Abd: Soft, Nontender Ext: no edema Skin: No Rash Neuro: nonfocal MSK: no peripheral joint swelling/tenderness/warmth; back spines nontender   Central line presence: right chest port site no tenderness/erythema   Labs: See a&p  Micro:  Serology:  Imaging:  Assessment & Plan:   Problem List Items Addressed This Visit   None     No orders of the defined types were placed in this encounter.    Keianna P Hopkins is a 83 y.o. female admitted with:    Acute Respiratory Failure -  Abnormal TTE -  Concerning for TAVR Endocarditis with New AV Stenosis / Insufficiency TAVR  Cardiology has  seen - endocarditis vs leaflet thrombosis causing heart failure symptoms. Not a candidate for invasive procedures, including TEE based on notes. No positive blood cultures.  It is not terribly clear she has infection contributing to the problem. Resumed on anticoagulation (Xarelto  20 mg) given AFib and possible acute thrombosis of valve.  She had several of her daughters in the room today. Would like to go forward with antibiotics in light of uncertainty. Broad coverage with daptomycin  + ceftriaxone .  -Continue daptomycin  + ceftriaxone  for now for safer antibiotic profile x 6 weeks -OPAT outlined below.      Pancreatic Adenocarcinoma, On Chemotherapy s/p biliary stent -  Goals of Care -  D/W Dr. Gladstone Lamer who discussed with Dr. Baldwin Bones with oncology with understanding to treat what is treatable. Verbalized to cardiology that she wanted full comfort measures  -Palliative medicine team has been consulted, notes reviewed and she would like to pursue all efforts including oncology treatments in hopes to continue good QOL.    Vascular Access -  -PORT in place, no need for picc  -Home health orders to maintain port line care and education for patient described below    ------------ 03/25/24 id clinic assessment Presumed tavr endocarditis -- [place] vs infectious?? (incidental echo finding; negative bcx or sepsis) Is in her usual state of health today. Here with daughter.  No complaint Getting antibiotics through her port -- dapto/ceftriaxone  Not currently getting chemo for the pancreatic cancer -- last time chemo was  early 02/2024 Opat labs reviewed --  6/16 cbc 7.6/9/298; cr 0.66; sed rate 120; crp 67; cpk 255 6/09 crp 53; esr >120 Sed rate/crp high likely due to cancer  Finish antibiotics today 6/18; tte was done 6/18 pending read Follow up cardiology as discussed with them  Follow up with ID in about 4 weeks to see how she does off antibiotics     Follow-up: Return in about 4 weeks  (around 04/22/2024).      [name], MD [facility] 03/25/2024, 9:45 AM

## 2024-03-26 ENCOUNTER — Ambulatory Visit: Payer: Self-pay | Admitting: Infectious Diseases

## 2024-03-27 DIAGNOSIS — I5033 Acute on chronic diastolic (congestive) heart failure: Secondary | ICD-10-CM | POA: Diagnosis not present

## 2024-03-27 DIAGNOSIS — J189 Pneumonia, unspecified organism: Secondary | ICD-10-CM | POA: Diagnosis not present

## 2024-03-27 DIAGNOSIS — Z8673 Personal history of transient ischemic attack (TIA), and cerebral infarction without residual deficits: Secondary | ICD-10-CM | POA: Diagnosis not present

## 2024-03-27 DIAGNOSIS — I21A1 Myocardial infarction type 2: Secondary | ICD-10-CM | POA: Diagnosis not present

## 2024-03-27 DIAGNOSIS — I110 Hypertensive heart disease with heart failure: Secondary | ICD-10-CM | POA: Diagnosis not present

## 2024-03-27 DIAGNOSIS — J9601 Acute respiratory failure with hypoxia: Secondary | ICD-10-CM | POA: Diagnosis not present

## 2024-03-27 DIAGNOSIS — E1165 Type 2 diabetes mellitus with hyperglycemia: Secondary | ICD-10-CM | POA: Diagnosis not present

## 2024-03-27 DIAGNOSIS — A419 Sepsis, unspecified organism: Secondary | ICD-10-CM | POA: Diagnosis not present

## 2024-03-27 DIAGNOSIS — I480 Paroxysmal atrial fibrillation: Secondary | ICD-10-CM | POA: Diagnosis not present

## 2024-03-31 ENCOUNTER — Inpatient Hospital Stay (HOSPITAL_BASED_OUTPATIENT_CLINIC_OR_DEPARTMENT_OTHER): Admitting: Hematology

## 2024-03-31 ENCOUNTER — Encounter: Payer: Self-pay | Admitting: Nurse Practitioner

## 2024-03-31 ENCOUNTER — Inpatient Hospital Stay

## 2024-03-31 VITALS — BP 138/64 | HR 83 | Temp 97.2°F | Resp 18 | Ht 63.0 in | Wt 146.8 lb

## 2024-03-31 DIAGNOSIS — R530 Neoplastic (malignant) related fatigue: Secondary | ICD-10-CM

## 2024-03-31 DIAGNOSIS — D649 Anemia, unspecified: Secondary | ICD-10-CM | POA: Diagnosis not present

## 2024-03-31 DIAGNOSIS — Z95828 Presence of other vascular implants and grafts: Secondary | ICD-10-CM

## 2024-03-31 DIAGNOSIS — C250 Malignant neoplasm of head of pancreas: Secondary | ICD-10-CM | POA: Diagnosis not present

## 2024-03-31 DIAGNOSIS — G893 Neoplasm related pain (acute) (chronic): Secondary | ICD-10-CM | POA: Diagnosis not present

## 2024-03-31 DIAGNOSIS — Z515 Encounter for palliative care: Secondary | ICD-10-CM | POA: Diagnosis not present

## 2024-03-31 LAB — CBC WITH DIFFERENTIAL (CANCER CENTER ONLY)
Abs Immature Granulocytes: 0.03 10*3/uL (ref 0.00–0.07)
Basophils Absolute: 0 10*3/uL (ref 0.0–0.1)
Basophils Relative: 1 %
Eosinophils Absolute: 0.1 10*3/uL (ref 0.0–0.5)
Eosinophils Relative: 2 %
HCT: 30.4 % — ABNORMAL LOW (ref 36.0–46.0)
Hemoglobin: 9.4 g/dL — ABNORMAL LOW (ref 12.0–15.0)
Immature Granulocytes: 0 %
Lymphocytes Relative: 33 %
Lymphs Abs: 2.8 10*3/uL (ref 0.7–4.0)
MCH: 23.3 pg — ABNORMAL LOW (ref 26.0–34.0)
MCHC: 30.9 g/dL (ref 30.0–36.0)
MCV: 75.2 fL — ABNORMAL LOW (ref 80.0–100.0)
Monocytes Absolute: 0.7 10*3/uL (ref 0.1–1.0)
Monocytes Relative: 8 %
Neutro Abs: 4.8 10*3/uL (ref 1.7–7.7)
Neutrophils Relative %: 56 %
Platelet Count: 400 10*3/uL (ref 150–400)
RBC: 4.04 MIL/uL (ref 3.87–5.11)
RDW: 17.3 % — ABNORMAL HIGH (ref 11.5–15.5)
WBC Count: 8.5 10*3/uL (ref 4.0–10.5)
nRBC: 0 % (ref 0.0–0.2)

## 2024-03-31 LAB — CMP (CANCER CENTER ONLY)
ALT: 7 U/L (ref 0–44)
AST: 11 U/L — ABNORMAL LOW (ref 15–41)
Albumin: 3.6 g/dL (ref 3.5–5.0)
Alkaline Phosphatase: 52 U/L (ref 38–126)
Anion gap: 4 — ABNORMAL LOW (ref 5–15)
BUN: 10 mg/dL (ref 8–23)
CO2: 31 mmol/L (ref 22–32)
Calcium: 10.1 mg/dL (ref 8.9–10.3)
Chloride: 99 mmol/L (ref 98–111)
Creatinine: 0.58 mg/dL (ref 0.44–1.00)
GFR, Estimated: >60 mL/min (ref >60)
Glucose, Bld: 99 mg/dL (ref 70–99)
Potassium: 4.1 mmol/L (ref 3.5–5.1)
Sodium: 134 mmol/L — ABNORMAL LOW (ref 135–145)
Total Bilirubin: 0.3 mg/dL (ref 0.0–1.2)
Total Protein: 7.6 g/dL (ref 6.5–8.1)

## 2024-03-31 MED ORDER — SODIUM CHLORIDE 0.9% FLUSH
10.0000 mL | Freq: Once | INTRAVENOUS | Status: AC
  Administered 2024-03-31: 10 mL

## 2024-03-31 MED ORDER — HEPARIN SOD (PORK) LOCK FLUSH 100 UNIT/ML IV SOLN
500.0000 [IU] | Freq: Once | INTRAVENOUS | Status: AC
  Administered 2024-03-31: 500 [IU]

## 2024-03-31 NOTE — Progress Notes (Signed)
 Palliative Medicine [facility]  [phone] [phone]   Name: Janet Williamson Date: 03/31/2024 MRN: [id number]  DOB: 1941/05/02  Patient Care Team: Leonel Cole, MD as PCP - General (Family Medicine) Jeffrie Oneil BROCKS, MD as PCP - Cardiology (Cardiology) Lanny Callander, MD as Consulting Physician (Hematology) Burnette Fallow, MD as Consulting Physician (Gastroenterology) Rosalie Kitchens, MD as Consulting Physician (Gastroenterology) Pickenpack-Cousar, Fannie SAILOR, NP as Nurse Practitioner (Hospice and Palliative Medicine)    INTERVAL HISTORY: Janet Williamson is a 83 y.o. female with  oncologic medical history including pancreatic cancer (08/2023) not a further candidate for further treatment due to age and recent hospitalization for endorcarditis (02/2024).  Palliative is seeing patient for symptom management and goals of care.   SOCIAL HISTORY:     reports that she has never smoked. She has never used smokeless tobacco. She reports that she does not drink alcohol and does not use drugs.  ADVANCE DIRECTIVES:  None on file  CODE STATUS: DNR  PAST MEDICAL HISTORY: 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 Not seen on most recent TEE 2019.   Cardiomyopathy in other disease    Cataracts, bilateral    Diabetes mellitus    Type 2   Generalized osteoarthritis    GERD (gastroesophageal reflux disease)    HLD (hyperlipidemia)    Hypertension    Hypothyroidism    LBBB (left bundle branch block)    PAF (paroxysmal atrial fibrillation) (HCC)    a. s/p DCCV 06/2017, on Xarelto    Phlebitis    S/P TAVR (transcatheter aortic valve replacement) 04/15/2018   Edwards Sapien 3 THV (size 23 mm, model # 9600TFX, serial # [id number]) via the TF approach   Severe aortic stenosis    a. s/p TAVR 04/2018.   Stroke [facility]) 2008    ALLERGIES:  is allergic to ace  inhibitors, keflex [cephalexin], and rifadin [rifampin].  MEDICATIONS:  Current Outpatient Medications  Medication Sig Dispense Refill   acetaminophen  (TYLENOL ) 325 MG tablet Take 325-650 mg by mouth every 8 (eight) hours as needed (for headaches).     acetaminophen -codeine  (TYLENOL  #3) 300-30 MG tablet Take 1 tablet by mouth every 8 (eight) hours as needed for moderate pain (pain score 4-6). (Patient not taking: Reported on 03/25/2024) 30 tablet 0   amLODipine  (NORVASC ) 5 MG tablet Take 5 mg by mouth daily.     baclofen  (LIORESAL ) 10 MG tablet Take 1 tablet (10 mg total) by mouth 3 (three) times daily. 30 each 0   Blood Glucose Monitoring Suppl (ACCU-CHEK GUIDE ME) w/Device KIT by Does not apply route.     Blood Glucose Monitoring Suppl (ONE TOUCH ULTRA 2) w/Device KIT Use to check blood sugars three times daily DX: E11.9 1 kit 0   Cholecalciferol  (VITAMIN D3) 5000 units CAPS Take 5,000 Units by mouth daily.     CREON  36000-114000 units CPEP capsule TAKE TWO (2) CAPSULES BY MOUTH THREE TIMES A DAY WITH MEALS TAKE 1 CAPSULE BY MOUTH AS NEEDED WITH SNACK. MAX 10/DAY *NEW PRESCRIPTION REQUEST* 900 capsule 11   furosemide  (LASIX ) 20 MG tablet Take 1 tablet (20 mg total) by mouth daily. Can take an additional tablet as needed for weight gain     Lancets (ONETOUCH DELICA PLUS LANCET33G) MISC TEST BLOOD SUGAR THREE TIMES DAILY 300 each 10   lidocaine -prilocaine  (EMLA ) cream  APPLY A QUARTER SIZE AMOUNT OF CREAM TO PORT - A- CATH 45 MINUTES TO 1 HOUR PRIOR TO PORT-A-CATH BEING ACCESS. *NEW PRESCRIPTION REQUEST* 90 g 11   loperamide  (IMODIUM ) 2 MG capsule Take 1 capsule (2 mg total) by mouth as needed for diarrhea or loose stools. 30 capsule 1   metFORMIN  (GLUCOPHAGE -XR) 750 MG 24 hr tablet TAKE 2 TABLETS ONE TIME DAILY WITH BREAKFAST 180 tablet 2   metoprolol  tartrate (LOPRESSOR ) 25 MG tablet Take 1 tablet (25 mg total) by mouth 2 (two) times daily. 60 tablet 3   ONETOUCH ULTRA TEST test strip TEST BLOOD  SUGAR THREE TIMES DAILY 300 strip 3   pantoprazole  (PROTONIX ) 20 MG tablet TAKE 1 TABLET BY MOUTH DAILY *NEW PRESCRIPTION REQUEST* 90 tablet 11   Polyethyl Glycol-Propyl Glycol (SYSTANE OP) Place 1 drop into both eyes 2 (two) times daily.      rivaroxaban  (XARELTO ) 20 MG TABS tablet Take 1 tablet (20 mg total) by mouth daily with supper. 30 tablet 0   traMADol  (ULTRAM ) 50 MG tablet Take 0.5-1 tablets (25-50 mg total) by mouth every 6 (six) hours as needed. 30 tablet 0   No current facility-administered medications for this visit.   Facility-Administered Medications Ordered in Other Visits  Medication Dose Route Frequency Provider Last Rate Last Admin   0.9 %  sodium chloride  infusion   Intravenous Continuous Lanny Callander, MD   Stopped at 12/11/23 1305   0.9 %  sodium chloride  infusion   Intravenous Continuous Lanny Callander, MD   Stopped at 12/11/23 1304    VITAL SIGNS: There were no vitals taken for this visit. There were no vitals filed for this visit.  Estimated body mass index is 26 kg/m as calculated from the following:   Height as of an earlier encounter on 03/31/24: 5' 3 (1.6 m).   Weight as of an earlier encounter on 03/31/24: 146 lb 12.8 oz (66.6 kg).   PERFORMANCE STATUS (ECOG) : 1 - Symptomatic but completely ambulatory  Physical Exam General: NAD Cardiovascular: regular rate and rhythm Pulmonary: normal breathing pattern Extremities: no edema, no joint deformities Skin: no rashes Neurological: AAO x3  IMPRESSION: Discussed the use of AI scribe software for clinical note transcription with the patient, who gave verbal consent to proceed.  History of Present Illness Janet Williamson is an 83 year old female with pancreatic cancer who presents for a routine follow-up.  No acute distress noted.  Family present.  Patient is ambulatory with a rollator.  States she is doing well overall and is much appreciative of this compared to previous months.  She has no issues with pain and  is not taking any analgesics such as acetaminophen . No gastrointestinal symptoms such as constipation, diarrhea, nausea, or vomiting are present. Her sleep is adequate, and she describes her energy level as good.  Patient's appetite is fair.  Some days are better than others.  Current weight is stable at 146 pounds.   Goals of care 03/11/2024:We discussed her current illness and what it means in the larger context of her on-going co-morbidities. Natural disease trajectory and expectations were discussed. Ms. Mothershead and her granddaughter are realistic in their understanding of her terminal cancer with no further treatment options.    She is clear in expressed wishes to continue to treat the treatable and aggressively manage her symptoms at this time. I empathetically approached discussions regarding healthcare limitations.    Education provided on the differences, goals and philosophy, and what care would  look like with palliative versus hospice. Ms. Bonser is clear that she is not interested in pursuing hospice care at this time. She shares she almost did not want to come to her appointment today with myself out of fear she was being enrolled in hospice. I had open discussion with patient sharing at a point hospice would be appropriate for her care to allow her to spend what time she has left comfortable amongst her family. She verbalized understanding and appreciation.    Patient is a DNR/DNI as confirmed. We will continue ongoing goals of care discussions as needed.   I discussed the importance of continued conversation with family and their medical providers regarding overall plan of care and treatment options, ensuring decisions are within the context of the patients values and GOCs. Assessment & Plan Cancer related pain and lower back and leg Patient reports pain is much improved.  Not requiring daily use of medication. -Continue with tramadol  50 mg every 6-8 hours as needed. -Baclofen  as  needed for muscle spasms.  Goals of Care Patient and family expressed clear understanding of terminal pancreatic cancer. They are currently not interested in enrolling in hospice.  -Goals clear to continue treating the treatable allowing patient an opportunity to continue thriving while aggressively managing symptoms.  -DNR/DNI -Direct discussion regarding need for hospice in the future as health further declines.  -We will continue goals of care discussions offering support to patient and family as they navigate complex decisions.   No current uncontrolled symptoms. - Follow up in 6-8 weeks.  Sooner if needed.  Patient expressed understanding and was in agreement with this plan. She also understands that She can call the clinic at any time with any questions, concerns, or complaints.   Any controlled substances utilized were prescribed in the context of palliative care. PDMP has been reviewed.   Visit consisted of counseling and education dealing with the complex and emotionally intense issues of symptom management and palliative care in the setting of serious and potentially life-threatening illness.  Levon Borer, AGPCNP-BC  Palliative Medicine Team/[facility]

## 2024-03-31 NOTE — Assessment & Plan Note (Signed)
 T3N0 by EUS, questionable peritoneal carcinomatosis -She had an episode of abdominal pain, nausea, and dizziness at church and came to ED 08/18/2023 -CT AP done 10/08/2023 showing a stable solid right lower lobe pulmonary nodule from 2019 and an ill-defined hypodense lesion in the pancreatic head measuring 4.4 x 3.9 x 3.3 cm obstructing the pancreatic and CBD appearing to encase and occlude the SMV and portal confluence with soft tissue stranding extending along the mesenteric root with nodular foci peritoneal soft tissue nodularity anterior to the mass; overall concerning for primary pancreatic malignancy with peritoneal carcinomatosis.    -EUS 10/16/23 by Dr. Kimble Pennant showing a T3 N0 pancreatic head mass invading the SMA and ERCP with stenting by Dr. Lavaughn Portland, cytology confirming adenocarcinoma.  -Due to her age, the vascular invasion/abutment of the SMA, and possible peritoneal involvement, she is likely not a surgical candidate but Dr. Cherlynn Cornfield has reviewed her case and agreed to see the pt for surgical discussion. She was seen on 11/26/2023 and felt to be a poor candidate for surgery  -PET scan showed no definite evidence of metastatic disease.  The questionable peritoneal involvement on CT is felt to be local extension -plan to start chemo gemcitabine  alone, and may add Abraxane  from second cycle if tolerates well. She started chemo on 11/13/2023, she did not tolerate gem/Abraxane , and chemo changed back to gemcitabine  alone  -restaging CT 4/23 showed sable pancreatic mass but a new 1.1cm liver lesion which is suspicious for new met, which also appears to be suspicious on liver MRI in early May -Pt was hospitalized for acute endicarditis on 02/12/2024 and she will continue antibiotics for 6 weeks

## 2024-03-31 NOTE — Progress Notes (Signed)
 [facility]   [phone] Fax:([phone]   Clinic Follow up Note   Patient Care Team: Leonel Cole, MD as PCP - General (Family Medicine) Jeffrie Oneil BROCKS, MD as PCP - Cardiology (Cardiology) Lanny Callander, MD as Consulting Physician (Hematology) Burnette Fallow, MD as Consulting Physician (Gastroenterology) Rosalie Kitchens, MD as Consulting Physician (Gastroenterology) Pickenpack-Cousar, Fannie SAILOR, NP as Nurse Practitioner [facility])  Date of Service:  03/31/2024  CHIEF COMPLAINT: f/u of pancreatic cancer  CURRENT THERAPY:  Supportive care  Oncology History   Malignant neoplasm of head of pancreas (HCC) T3N0 by EUS, questionable peritoneal carcinomatosis -She had an episode of abdominal pain, nausea, and dizziness at church and came to ED 08/18/2023 -CT AP done 10/08/2023 showing a stable solid right lower lobe pulmonary nodule from 2019 and an ill-defined hypodense lesion in the pancreatic head measuring 4.4 x 3.9 x 3.3 cm obstructing the pancreatic and CBD appearing to encase and occlude the SMV and portal confluence with soft tissue stranding extending along the mesenteric root with nodular foci peritoneal soft tissue nodularity anterior to the mass; overall concerning for primary pancreatic malignancy with peritoneal carcinomatosis.    -EUS 10/16/23 by Dr. Burnette showing a T3 N0 pancreatic head mass invading the SMA and ERCP with stenting by Dr. Rosalie, cytology confirming adenocarcinoma.  -Due to her age, the vascular invasion/abutment of the SMA, and possible peritoneal involvement, she is likely not a surgical candidate but Dr. Aron has reviewed her case and agreed to see the pt for surgical discussion. She was seen on 11/26/2023 and felt to be a poor candidate for surgery  -PET scan showed no definite evidence of metastatic disease.  The questionable peritoneal involvement on CT is felt to be local extension -plan to start chemo  gemcitabine  alone, and may add Abraxane  from second cycle if tolerates well. She started chemo on 11/13/2023, she did not tolerate gem/Abraxane , and chemo changed back to gemcitabine  alone  -restaging CT 4/23 showed sable pancreatic mass but a new 1.1cm liver lesion which is suspicious for new met, which also appears to be suspicious on liver MRI in early May -Pt was hospitalized for acute endicarditis on 02/12/2024 and she will continue antibiotics for 6 weeks  Assessment & Plan Pancreatic cancer Pancreatic cancer with improved quality of life following cessation of chemotherapy. No current symptoms such as pain, nausea, or gastrointestinal issues. Appetite and energy levels have improved. She prefers routine blood work and monitoring at the clinic rather than home care. - Continue supportive care - Monitor for new symptoms - Perform cancer-related scans or tests only if clinically indicated - Conduct routine blood work during visits  Anemia, unspecified Anemia with hemoglobin level of 9.4. No acute symptoms reported. Blood counts are well-managed.  Plan - She is clinically doing well, lab reviewed. - Continue observation, she knows to call me if she develops more symptoms - Lab and follow-up in 6 weeks   SUMMARY OF ONCOLOGIC HISTORY: Oncology History  Malignant neoplasm of head of pancreas (HCC)  10/16/2023 Cancer Staging   Staging form: Exocrine Pancreas, AJCC 8th Edition - Clinical stage from 10/16/2023: Stage IIA (cT3, cN0, cM0) - Signed by Lanny Callander, MD on 11/20/2023 Total positive nodes: 0   10/24/2023 Initial Diagnosis   Malignant neoplasm of head of pancreas (HCC)   11/13/2023 -  Chemotherapy   Patient is on Treatment Plan : PANCREATIC Abraxane  D1,8,15 + Gemcitabine  D1,8,15 q28d     12/04/2023 Genetic Testing  Negative genetic testing on the CancerNext+RNA panel.  BRCA1  p.D695N (c.2083G>A)  VUS identified.  The report date is 12/03/2023.  The [facility] CancerNext+RNAinsight Panel  includes sequencing, rearrangement analysis, and RNA analysis for the following 39 genes: APC, ATM, BAP1, BARD1, BMPR1A, BRCA1, BRCA2, BRIP1, CDH1, CDKN2A, CHEK2, FH, FLCN, MET, MLH1, MSH2, MSH6, MUTYH, NF1, NTHL1, PALB2, PMS2, PTEN, RAD51C, RAD51D, SMAD4, STK11, TP53, TSC1, TSC2, and VHL (sequencing and deletion/duplication); AXIN2, HOXB13, MBD4, MSH3, POLD1 and POLE (sequencing only); EPCAM and GREM1 (deletion/duplication only).       Discussed the use of AI scribe software for clinical note transcription with the patient, who gave verbal consent to proceed.  History of Present Illness Janet Williamson is an 83 year old female with pancreatic cancer who presents for follow-up.  She has no new symptoms, pain, or gastrointestinal issues. Her weight is stable, and her appetite has improved since discontinuing chemotherapy. She experiences no constipation, diarrhea, nausea, or stomach issues. Her energy levels have improved since stopping chemotherapy.     All other systems were reviewed with the patient and are negative.  MEDICAL HISTORY:  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 Not seen on most recent TEE 2019.   Cardiomyopathy in other disease    Cataracts, bilateral    Diabetes mellitus    Type 2   Generalized osteoarthritis    GERD (gastroesophageal reflux disease)    HLD (hyperlipidemia)    Hypertension    Hypothyroidism    LBBB (left bundle branch block)    PAF (paroxysmal atrial fibrillation) (HCC)    a. s/p DCCV 06/2017, on Xarelto    Phlebitis    S/P TAVR (transcatheter aortic valve replacement) 04/15/2018   Edwards Sapien 3 THV (size 23 mm, model # 9600TFX, serial # [id number]) via the TF approach   Severe aortic stenosis    a. s/p TAVR 04/2018.   Stroke [facility]) 2008    SURGICAL HISTORY: Past Surgical History:  Procedure Laterality Date   ABDOMINAL HYSTERECTOMY      BILIARY BRUSHING  10/16/2023   Procedure: BILIARY BRUSHING;  Surgeon: Burnette Fallow, MD;  Location: [facility];  Service: Gastroenterology;;   BILIARY STENT PLACEMENT N/A 10/16/2023   Procedure: BILIARY STENT PLACEMENT;  Surgeon: Burnette Fallow, MD;  Location: [facility];  Service: Gastroenterology;  Laterality: N/A;   BIOPSY  05/19/2019   Procedure: BIOPSY;  Surgeon: Donnald Charleston, MD;  Location: [facility]COPY;  Service: Endoscopy;;   BREAST BIOPSY Left    years ago at a doctor's office   COLONOSCOPY     COLONOSCOPY WITH PROPOFOL  N/A 10/02/2018   Procedure: COLONOSCOPY WITH PROPOFOL ;  Surgeon: Burnette Fallow, MD;  Location: [facility] ENDOSCOPY;  Service: Endoscopy;  Laterality: N/A;   ENTEROSCOPY N/A 05/19/2019   Procedure: ENTEROSCOPY;  Surgeon: Donnald Charleston, MD;  Location: [facility] ENDOSCOPY;  Service: Endoscopy;  Laterality: N/A;   ERCP N/A 10/16/2023   Procedure: ENDOSCOPIC RETROGRADE CHOLANGIOPANCREATOGRAPHY (ERCP);  Surgeon: Burnette Fallow, MD;  Location: [facility];  Service: Gastroenterology;  Laterality: N/A;   ESOPHAGOGASTRODUODENOSCOPY (EGD) WITH PROPOFOL  N/A 10/02/2018   Procedure: ESOPHAGOGASTRODUODENOSCOPY (EGD) WITH PROPOFOL ;  Surgeon: Burnette Fallow, MD;  Location: [facility] ENDOSCOPY;  Service: Endoscopy;  Laterality: N/A;   ESOPHAGOGASTRODUODENOSCOPY (EGD) WITH PROPOFOL  N/A 04/23/2019   Procedure: ESOPHAGOGASTRODUODENOSCOPY (EGD) WITH PROPOFOL ;  Surgeon: Lennard Lesta FALCON, MD;  Location: [facility] ENDOSCOPY;  Service: Endoscopy;  Laterality: N/A;  ESOPHAGOGASTRODUODENOSCOPY (EGD) WITH PROPOFOL  N/A 10/16/2023   Procedure: ESOPHAGOGASTRODUODENOSCOPY (EGD) WITH PROPOFOL ;  Surgeon: Burnette Fallow, MD;  Location: [facility];  Service: Gastroenterology;  Laterality: N/A;   EUS N/A 10/16/2023   Procedure: FULL UPPER ENDOSCOPIC ULTRASOUND (EUS) RADIAL;  Surgeon: Burnette Fallow, MD;  Location: [facility];  Service: Gastroenterology;  Laterality: N/A;   EYE SURGERY Bilateral    cataract removal    FINE NEEDLE ASPIRATION N/A 10/16/2023   Procedure: FINE NEEDLE ASPIRATION (FNA) LINEAR;  Surgeon: Burnette Fallow, MD;  Location: [facility];  Service: Gastroenterology;  Laterality: N/A;   GIVENS CAPSULE STUDY N/A 10/02/2018   Procedure: GIVENS CAPSULE STUDY;  Surgeon: Burnette Fallow, MD;  Location: [facility] ENDOSCOPY;  Service: Endoscopy;  Laterality: N/A;   IR IMAGING GUIDED PORT INSERTION  01/10/2024   RIGHT/LEFT HEART CATH AND CORONARY ANGIOGRAPHY N/A 03/06/2018   Procedure: RIGHT/LEFT HEART CATH AND CORONARY ANGIOGRAPHY;  Surgeon: Claudene Victory ORN, MD;  Location: [facility] INVASIVE CV LAB;  Service: Cardiovascular;  Laterality: N/A;   SMALL BOWEL ENTEROSCOPY  05/19/2019   SPHINCTEROTOMY  10/16/2023   Procedure: SPHINCTEROTOMY;  Surgeon: Burnette Fallow, MD;  Location: [facility];  Service: Gastroenterology;;   TEE WITHOUT CARDIOVERSION N/A 04/01/2018   Procedure: TRANSESOPHAGEAL ECHOCARDIOGRAM (TEE);  Surgeon: Jeffrie Oneil BROCKS, MD;  Location: [facility] ENDOSCOPY;  Service: Cardiovascular;  Laterality: N/A;   TEE WITHOUT CARDIOVERSION N/A 04/15/2018   Procedure: TRANSESOPHAGEAL ECHOCARDIOGRAM (TEE);  Surgeon: Wonda Sharper, MD;  Location: [facility] OR;  Service: Open Heart Surgery;  Laterality: N/A;   TOTAL KNEE ARTHROPLASTY Right 04/14/2013   Dr Addie   TOTAL KNEE ARTHROPLASTY Right 04/14/2013   Procedure: TOTAL KNEE ARTHROPLASTY;  Surgeon: Cordella Glendia Addie, MD;  Location[facility] OR;  Service: Orthopedics;  Laterality: Right;   TRANSCATHETER AORTIC VALVE REPLACEMENT, TRANSFEMORAL  04/15/2018   TRANSCATHETER AORTIC VALVE REPLACEMENT, TRANSFEMORAL Bilateral 04/15/2018   Procedure: TRANSCATHETER AORTIC VALVE REPLACEMENT, TRANSFEMORAL;  Surgeon: Wonda Sharper, MD;  Location: [facility] OR;  Service: Open Heart Surgery;  Laterality: Bilateral;    I have reviewed the social history and family history with the patient and they are unchanged from previous note.  ALLERGIES:  is allergic to ace inhibitors, keflex [cephalexin], and rifadin  [rifampin].  MEDICATIONS:  Current Outpatient Medications  Medication Sig Dispense Refill   acetaminophen  (TYLENOL ) 325 MG tablet Take 325-650 mg by mouth every 8 (eight) hours as needed (for headaches).     acetaminophen -codeine  (TYLENOL  #3) 300-30 MG tablet Take 1 tablet by mouth every 8 (eight) hours as needed for moderate pain (pain score 4-6). (Patient not taking: Reported on 03/25/2024) 30 tablet 0   amLODipine  (NORVASC ) 5 MG tablet Take 5 mg by mouth daily.     baclofen  (LIORESAL ) 10 MG tablet Take 1 tablet (10 mg total) by mouth 3 (three) times daily. 30 each 0   Blood Glucose Monitoring Suppl (ACCU-CHEK GUIDE ME) w/Device KIT by Does not apply route.     Blood Glucose Monitoring Suppl (ONE TOUCH ULTRA 2) w/Device KIT Use to check blood sugars three times daily DX: E11.9 1 kit 0   Cholecalciferol  (VITAMIN D3) 5000 units CAPS Take 5,000 Units by mouth daily.     CREON  36000-114000 units CPEP capsule TAKE TWO (2) CAPSULES BY MOUTH THREE TIMES A DAY WITH MEALS TAKE 1 CAPSULE BY MOUTH AS NEEDED WITH SNACK. MAX 10/DAY *NEW PRESCRIPTION REQUEST* 900 capsule 11   furosemide  (LASIX ) 20 MG tablet Take 1 tablet (20 mg total) by mouth daily. Can take an additional tablet as needed  for weight gain     Lancets (ONETOUCH DELICA PLUS LANCET33G) MISC TEST BLOOD SUGAR THREE TIMES DAILY 300 each 10   lidocaine -prilocaine  (EMLA ) cream APPLY A QUARTER SIZE AMOUNT OF CREAM TO PORT - A- CATH 45 MINUTES TO 1 HOUR PRIOR TO PORT-A-CATH BEING ACCESS. *NEW PRESCRIPTION REQUEST* 90 g 11   loperamide  (IMODIUM ) 2 MG capsule Take 1 capsule (2 mg total) by mouth as needed for diarrhea or loose stools. 30 capsule 1   metFORMIN  (GLUCOPHAGE -XR) 750 MG 24 hr tablet TAKE 2 TABLETS ONE TIME DAILY WITH BREAKFAST 180 tablet 2   metoprolol  tartrate (LOPRESSOR ) 25 MG tablet Take 1 tablet (25 mg total) by mouth 2 (two) times daily. 60 tablet 3   ONETOUCH ULTRA TEST test strip TEST BLOOD SUGAR THREE TIMES DAILY 300 strip 3    pantoprazole  (PROTONIX ) 20 MG tablet TAKE 1 TABLET BY MOUTH DAILY *NEW PRESCRIPTION REQUEST* 90 tablet 11   Polyethyl Glycol-Propyl Glycol (SYSTANE OP) Place 1 drop into both eyes 2 (two) times daily.      rivaroxaban  (XARELTO ) 20 MG TABS tablet Take 1 tablet (20 mg total) by mouth daily with supper. 30 tablet 0   traMADol  (ULTRAM ) 50 MG tablet Take 0.5-1 tablets (25-50 mg total) by mouth every 6 (six) hours as needed. 30 tablet 0   No current facility-administered medications for this visit.   Facility-Administered Medications Ordered in Other Visits  Medication Dose Route Frequency Provider Last Rate Last Admin   0.9 %  sodium chloride  infusion   Intravenous Continuous Lanny Callander, MD   Stopped at 12/11/23 1305   0.9 %  sodium chloride  infusion   Intravenous Continuous Lanny Callander, MD   Stopped at 12/11/23 1304    PHYSICAL EXAMINATION: ECOG PERFORMANCE STATUS: 1 - Symptomatic but completely ambulatory  Vitals:   03/31/24 1610  BP: 138/64  Pulse: 83  Resp: 18  Temp: (!) 97.2 F (36.2 C)  SpO2: 99%   Wt Readings from Last 3 Encounters:  03/31/24 146 lb 12.8 oz (66.6 kg)  03/25/24 145 lb (65.8 kg)  03/16/24 146 lb 6.4 oz (66.4 kg)     GENERAL:alert, no distress and comfortable SKIN: skin color, texture, turgor are normal, no rashes or significant lesions EYES: normal, Conjunctiva are pink and non-injected, sclera clear NECK: supple, thyroid  normal size, non-tender, without nodularity LYMPH:  no palpable lymphadenopathy in the cervical, axillary  LUNGS: clear to auscultation and percussion with normal breathing effort HEART: regular rate & rhythm and no murmurs and no lower extremity edema ABDOMEN:abdomen soft, non-tender and normal bowel sounds Musculoskeletal:no cyanosis of digits and no clubbing  NEURO: alert & oriented x 3 with fluent speech, no focal motor/sensory deficits  Physical Exam    LABORATORY DATA:  I have reviewed the data as listed    Latest Ref Rng & Units  03/31/2024    3:52 PM 03/04/2024    8:38 AM 02/18/2024    2:43 AM  CBC  WBC 4.0 - 10.5 K/uL 8.5  9.0  3.6   Hemoglobin 12.0 - 15.0 g/dL 9.4  9.7  9.0   Hematocrit 36.0 - 46.0 % 30.4  30.9  28.8   Platelets 150 - 400 K/uL 400  522  156         Latest Ref Rng & Units 03/31/2024    3:52 PM 03/04/2024    8:38 AM 02/18/2024    2:43 AM  CMP  Glucose 70 - 99 mg/dL 99  833  864  BUN 8 - 23 mg/dL 10  10  9    Creatinine 0.44 - 1.00 mg/dL 9.41  9.36  9.26   Sodium 135 - 145 mmol/L 134  136  137   Potassium 3.5 - 5.1 mmol/L 4.1  3.8  3.7   Chloride 98 - 111 mmol/L 99  99  98   CO2 22 - 32 mmol/L 31  31  30    Calcium  8.9 - 10.3 mg/dL 89.8  9.8  9.4   Total Protein 6.5 - 8.1 g/dL 7.6  7.7    Total Bilirubin 0.0 - 1.2 mg/dL 0.3  0.3    Alkaline Phos 38 - 126 U/L 52  55    AST 15 - 41 U/L 11  15    ALT 0 - 44 U/L 7  8        RADIOGRAPHIC STUDIES: I have personally reviewed the radiological images as listed and agreed with the findings in the report. No results found.    No orders of the defined types were placed in this encounter.  All questions were answered. The patient knows to call the clinic with any problems, questions or concerns. No barriers to learning was detected. The total time spent in the appointment was 25 minutes, including review of chart and various tests results, discussions about plan of care and coordination of care plan     Onita Mattock, MD 03/31/2024

## 2024-04-01 DIAGNOSIS — E1169 Type 2 diabetes mellitus with other specified complication: Secondary | ICD-10-CM | POA: Diagnosis not present

## 2024-04-01 DIAGNOSIS — I10 Essential (primary) hypertension: Secondary | ICD-10-CM | POA: Diagnosis not present

## 2024-04-01 DIAGNOSIS — E1165 Type 2 diabetes mellitus with hyperglycemia: Secondary | ICD-10-CM | POA: Diagnosis not present

## 2024-04-01 DIAGNOSIS — I350 Nonrheumatic aortic (valve) stenosis: Secondary | ICD-10-CM | POA: Diagnosis not present

## 2024-04-01 LAB — CANCER ANTIGEN 19-9: CA 19-9: <2 U/mL (ref 0–35)

## 2024-04-02 ENCOUNTER — Telehealth: Payer: Self-pay | Admitting: Hematology

## 2024-04-02 NOTE — Telephone Encounter (Signed)
 Scheduled appointment per 6/24 los. Called and left a VM with appointment details for the patient.

## 2024-04-05 ENCOUNTER — Other Ambulatory Visit: Payer: Self-pay

## 2024-04-05 ENCOUNTER — Encounter (HOSPITAL_COMMUNITY): Payer: Self-pay | Admitting: Emergency Medicine

## 2024-04-05 ENCOUNTER — Emergency Department (HOSPITAL_COMMUNITY)
Admission: EM | Admit: 2024-04-05 | Discharge: 2024-04-05 | Disposition: A | Discharge status: Home / Self Care | Attending: Emergency Medicine | Admitting: Emergency Medicine

## 2024-04-05 DIAGNOSIS — C259 Malignant neoplasm of pancreas, unspecified: Secondary | ICD-10-CM | POA: Diagnosis not present

## 2024-04-05 DIAGNOSIS — C250 Malignant neoplasm of head of pancreas: Secondary | ICD-10-CM | POA: Diagnosis not present

## 2024-04-05 DIAGNOSIS — R634 Abnormal weight loss: Secondary | ICD-10-CM | POA: Insufficient documentation

## 2024-04-05 DIAGNOSIS — Z7901 Long term (current) use of anticoagulants: Secondary | ICD-10-CM | POA: Diagnosis not present

## 2024-04-05 DIAGNOSIS — K591 Functional diarrhea: Secondary | ICD-10-CM | POA: Diagnosis not present

## 2024-04-05 DIAGNOSIS — I509 Heart failure, unspecified: Secondary | ICD-10-CM | POA: Diagnosis not present

## 2024-04-05 DIAGNOSIS — I110 Hypertensive heart disease with heart failure: Secondary | ICD-10-CM | POA: Diagnosis not present

## 2024-04-05 DIAGNOSIS — R197 Diarrhea, unspecified: Secondary | ICD-10-CM | POA: Diagnosis not present

## 2024-04-05 DIAGNOSIS — Z79899 Other long term (current) drug therapy: Secondary | ICD-10-CM | POA: Diagnosis not present

## 2024-04-05 LAB — CBC WITH DIFFERENTIAL/PLATELET
Abs Immature Granulocytes: 0.02 10*3/uL (ref 0.00–0.07)
Basophils Absolute: 0.1 10*3/uL (ref 0.0–0.1)
Basophils Relative: 1 %
Eosinophils Absolute: 0.1 10*3/uL (ref 0.0–0.5)
Eosinophils Relative: 2 %
HCT: 35.7 % — ABNORMAL LOW (ref 36.0–46.0)
Hemoglobin: 10.5 g/dL — ABNORMAL LOW (ref 12.0–15.0)
Immature Granulocytes: 0 %
Lymphocytes Relative: 27 %
Lymphs Abs: 1.6 10*3/uL (ref 0.7–4.0)
MCH: 22.9 pg — ABNORMAL LOW (ref 26.0–34.0)
MCHC: 29.4 g/dL — ABNORMAL LOW (ref 30.0–36.0)
MCV: 77.9 fL — ABNORMAL LOW (ref 80.0–100.0)
Monocytes Absolute: 0.5 10*3/uL (ref 0.1–1.0)
Monocytes Relative: 8 %
Neutro Abs: 3.7 10*3/uL (ref 1.7–7.7)
Neutrophils Relative %: 62 %
Platelets: 391 10*3/uL (ref 150–400)
RBC: 4.58 MIL/uL (ref 3.87–5.11)
RDW: 17.2 % — ABNORMAL HIGH (ref 11.5–15.5)
WBC: 6 10*3/uL (ref 4.0–10.5)
nRBC: 0 % (ref 0.0–0.2)

## 2024-04-05 LAB — COMPREHENSIVE METABOLIC PANEL WITH GFR
ALT: 10 U/L (ref 0–44)
AST: 24 U/L (ref 15–41)
Albumin: 3.2 g/dL — ABNORMAL LOW (ref 3.5–5.0)
Alkaline Phosphatase: 54 U/L (ref 38–126)
Anion gap: 10 (ref 5–15)
BUN: 11 mg/dL (ref 8–23)
CO2: 28 mmol/L (ref 22–32)
Calcium: 10 mg/dL (ref 8.9–10.3)
Chloride: 97 mmol/L — ABNORMAL LOW (ref 98–111)
Creatinine, Ser: 0.65 mg/dL (ref 0.44–1.00)
GFR, Estimated: >60 mL/min (ref >60)
Glucose, Bld: 100 mg/dL — ABNORMAL HIGH (ref 70–99)
Potassium: 4.4 mmol/L (ref 3.5–5.1)
Sodium: 135 mmol/L (ref 135–145)
Total Bilirubin: 0.7 mg/dL (ref 0.0–1.2)
Total Protein: 7.8 g/dL (ref 6.5–8.1)

## 2024-04-05 LAB — MAGNESIUM: Magnesium: 1.8 mg/dL (ref 1.7–2.4)

## 2024-04-05 MED ORDER — LACTATED RINGERS IV BOLUS
500.0000 mL | Freq: Once | INTRAVENOUS | Status: AC
  Administered 2024-04-05: 500 mL via INTRAVENOUS

## 2024-04-05 MED ORDER — LACTATED RINGERS IV BOLUS: 1000.0000 mL | Freq: Once | INTRAVENOUS | Status: DC

## 2024-04-05 MED ORDER — LOPERAMIDE HCL 2 MG PO CAPS: 2.0000 mg | ORAL_CAPSULE | Freq: Four times a day (QID) | ORAL | 0 refills | Status: AC | PRN

## 2024-04-05 MED ORDER — LOPERAMIDE HCL 2 MG PO CAPS: 4.0000 mg | ORAL_CAPSULE | Freq: Once | ORAL | Status: DC

## 2024-04-05 NOTE — ED Provider Triage Note (Signed)
 Emergency Medicine Provider Triage Evaluation Note  Janet Williamson , a 83 y.o. female  was evaluated in triage. Underlying hx of head of the pancreas on palliative care.  Pt complains of diarrhea since last night.  Has had 2 bowel movements with no blood present.  According to family ember at the bedside, she does have a history of heart failure and has lost 8 pounds in approximately 3 days.  She last ate yesterday some beef and broccoli.  Has not had any nausea, vomiting, fevers.  Review of Systems  Positive: Diarrhea Negative: Nausea, vomiting, fever  Physical Exam  There were no vitals taken for this visit. Gen:   Awake, no distress   Resp:  Normal effort  MSK:   Moves extremities without difficulty  Other:  Abdomen is soft, nontender to palpation.  Medical Decision Making  Medically screening exam initiated at 12:26 PM.  Appropriate orders placed.  Tiffini P Wickstrom was informed that the remainder of the evaluation will be completed by another provider, this initial triage assessment does not replace that evaluation, and the importance of remaining in the ED until their evaluation is complete.     Jaquelynn Wanamaker, PA-C 04/05/24 1230

## 2024-04-05 NOTE — Discharge Instructions (Addendum)
 You are seen in the ER for diarrhea.  Blood work is overall reassuring.  It is not clear why you are having diarrhea.  This could be cancer mediated process.  We have low suspicion that this is because of infectious reasons.  Hydrate well.  We have notified your cancer team about the visit here. Take loperamide  as needed for diarrhea.

## 2024-04-05 NOTE — ED Triage Notes (Signed)
 Patient arrives by POV with family c/o weight loss and diarrhea over past couple days.

## 2024-04-05 NOTE — ED Provider Notes (Signed)
 [place] EMERGENCY DEPARTMENT AT [facility] Provider Note   CSN: [id number] Arrival date & time: 04/05/24  1211     Patient presents with: Diarrhea   Janet Williamson is a 83 y.o. female.  {Add pertinent medical, surgical, social history, OB history to HPI:32962} HPI    83 year old female comes in with chief complaint of diarrhea.  Patient has history of CHF, A-fib, hypertension, malignant neoplasm of the pancreatic head, currently not a candidate for surgical intervention and not responding to chemotherapy.  Patient brought to the ER by family members.  According to daughters, patient has had about 6 pound weight loss in the last 3 days.  Additionally, she is having diarrhea.  She has had about 4 loose, watery bowel movements in the last 24 hours.  Patient denies any chest pain, shortness of breath, abdominal pain, dizziness, weakness. Family indicated that they were advised to bring patient to the ER if there is sizable weight loss.  Patient states that her appetite is at baseline, which is some days it is good other times it is not.  She has been making sure she gets Ensure in her.  Prior to Admission medications   Medication Sig Start Date End Date Taking? Authorizing Provider  loperamide  (IMODIUM ) 2 MG capsule Take 1 capsule (2 mg total) by mouth 4 (four) times daily as needed for diarrhea or loose stools. 04/05/24  Yes Charlyn Sora, MD  acetaminophen  (TYLENOL ) 325 MG tablet Take 325-650 mg by mouth every 8 (eight) hours as needed (for headaches).    [provider]  acetaminophen -codeine  (TYLENOL  #3) 300-30 MG tablet Take 1 tablet by mouth every 8 (eight) hours as needed for moderate pain (pain score 4-6). Patient not taking: Reported on 03/25/2024 03/04/24   Lanny Callander, MD  amLODipine  (NORVASC ) 5 MG tablet Take 5 mg by mouth daily.    [provider]  baclofen  (LIORESAL ) 10 MG tablet Take 1 tablet (10 mg total) by mouth 3 (three) times daily.  03/11/24   Pickenpack-Cousar, Fannie SAILOR, NP  Blood Glucose Monitoring Suppl (ACCU-CHEK GUIDE ME) w/Device KIT by Does not apply route.    [provider]  Blood Glucose Monitoring Suppl (ONE TOUCH ULTRA 2) w/Device KIT Use to check blood sugars three times daily DX: E11.9 08/23/20   Von Pacific, MD  Cholecalciferol  (VITAMIN D3) 5000 units CAPS Take 5,000 Units by mouth daily.    [provider]  CREON  36000-114000 units CPEP capsule TAKE TWO (2) CAPSULES BY MOUTH THREE TIMES A DAY WITH MEALS TAKE 1 CAPSULE BY MOUTH AS NEEDED WITH SNACK. MAX 10/DAY *NEW PRESCRIPTION REQUEST* 02/28/24   Hanford Powell BRAVO, NP  furosemide  (LASIX ) 20 MG tablet Take 1 tablet (20 mg total) by mouth daily. Can take an additional tablet as needed for weight gain 03/06/24   Wyn Jackee VEAR Mickey., NP  Lancets [facility] DELICA PLUS [place]) MISC TEST BLOOD SUGAR THREE TIMES DAILY 08/16/22   Von Pacific, MD  lidocaine -prilocaine  (EMLA ) cream APPLY A QUARTER SIZE AMOUNT OF CREAM TO PORT - A- CATH 45 MINUTES TO 1 HOUR PRIOR TO PORT-A-CATH BEING ACCESS. *NEW PRESCRIPTION REQUEST* 02/28/24   Hanford Powell BRAVO, NP  metFORMIN  (GLUCOPHAGE -XR) 750 MG 24 hr tablet TAKE 2 TABLETS ONE TIME DAILY WITH BREAKFAST 11/27/23   Thapa, [place], MD  metoprolol  tartrate (LOPRESSOR ) 25 MG tablet Take 1 tablet (25 mg total) by mouth 2 (two) times daily. 02/18/24   Patsy Lenis, MD  [facility] ULTRA TEST test strip TEST BLOOD  SUGAR THREE TIMES DAILY 04/12/23   Von Pacific, MD  pantoprazole  (PROTONIX ) 20 MG tablet TAKE 1 TABLET BY MOUTH DAILY *NEW PRESCRIPTION REQUEST* 02/28/24   Boscia, Heather E, NP  Polyethyl Glycol-Propyl Glycol (SYSTANE OP) Place 1 drop into both eyes 2 (two) times daily.     [provider]  rivaroxaban  (XARELTO ) 20 MG TABS tablet Take 1 tablet (20 mg total) by mouth daily with supper. 02/09/24   Patt Alm Macho, MD  traMADol  (ULTRAM ) 50 MG tablet Take 0.5-1 tablets (25-50 mg total) by mouth every 6 (six) hours as  needed. 03/11/24   Pickenpack-Cousar, Fannie SAILOR, NP    Allergies: Ace inhibitors, Keflex [cephalexin], and Rifadin [rifampin]    Review of Systems  All other systems reviewed and are negative.   Updated Vital Signs BP (!) 174/69   Pulse 75   Temp 98.2 F (36.8 C)   Resp 13   Ht 5' 3 (1.6 m)   Wt 64.9 kg   SpO2 100%   BMI 25.33 kg/m   Physical Exam Vitals and nursing note reviewed.  Constitutional:      Appearance: She is well-developed.  HENT:     Head: Atraumatic.     Mouth/Throat:     Mouth: Mucous membranes are dry.   Eyes:     General: No scleral icterus.    Extraocular Movements: Extraocular movements intact.     Pupils: Pupils are equal, round, and reactive to light.    Cardiovascular:     Rate and Rhythm: Normal rate.  Pulmonary:     Effort: Pulmonary effort is normal.  Abdominal:     Palpations: Abdomen is soft.     Tenderness: There is no abdominal tenderness.   Musculoskeletal:     Cervical back: Normal range of motion and neck supple.   Skin:    General: Skin is warm and dry.   Neurological:     Mental Status: She is alert and oriented to person, place, and time.     (all labs ordered are listed, but only abnormal results are displayed) Labs Reviewed  COMPREHENSIVE METABOLIC PANEL WITH GFR - Abnormal; Notable for the following components:      Result Value   Chloride 97 (*)    Glucose, Bld 100 (*)    Albumin 3.2 (*)    All other components within normal limits  CBC WITH DIFFERENTIAL/PLATELET - Abnormal; Notable for the following components:   Hemoglobin 10.5 (*)    HCT 35.7 (*)    MCV 77.9 (*)    MCH 22.9 (*)    MCHC 29.4 (*)    RDW 17.2 (*)    All other components within normal limits  MAGNESIUM     EKG: None  Radiology: No results found.  {Document cardiac monitor, telemetry assessment procedure when appropriate:32947} Procedures   Medications Ordered in the ED  loperamide  (IMODIUM ) capsule 4 mg (has no administration in  time range)  lactated ringers  bolus 500 mL (0 mLs Intravenous Stopped 04/05/24 1538)      {Click here for ABCD2, HEART and other calculators REFRESH Note before signing:1}                              Medical Decision Making Amount and/or Complexity of Data Reviewed Labs: ordered.  Risk Prescription drug management.   This patient presents to the ED with chief complaint(s) of *** with pertinent past medical history of *** .The complaint involves  an extensive differential diagnosis and also carries with it a high risk of complications and morbidity.    The differential diagnosis includes ***   The initial plan is to ***   Additional history obtained: Additional history obtained from {additional history:26846} Records reviewed {records:26847}  Independent labs interpretation:  The following labs were independently interpreted: ***  Independent visualization and interpretation of imaging: - I independently visualized the following imaging with scope of interpretation limited to determining acute life threatening conditions related to emergency care: ***, which revealed ***  Treatment and Reassessment: ***  Consultation: - Consulted or discussed management/test interpretation with external professional: ***  Consideration for admission or further workup:  Social Determinants of health:   Final diagnoses:  Functional diarrhea  Weight loss  Malignant neoplasm of head of pancreas [facility])    ED Discharge Orders          Ordered    loperamide  (IMODIUM ) 2 MG capsule  4 times daily PRN        04/05/24 1617

## 2024-04-06 ENCOUNTER — Telehealth: Payer: Self-pay

## 2024-04-06 DIAGNOSIS — E1165 Type 2 diabetes mellitus with hyperglycemia: Secondary | ICD-10-CM | POA: Diagnosis not present

## 2024-04-06 DIAGNOSIS — I10 Essential (primary) hypertension: Secondary | ICD-10-CM | POA: Diagnosis not present

## 2024-04-06 DIAGNOSIS — M1990 Unspecified osteoarthritis, unspecified site: Secondary | ICD-10-CM | POA: Diagnosis not present

## 2024-04-06 DIAGNOSIS — I480 Paroxysmal atrial fibrillation: Secondary | ICD-10-CM | POA: Diagnosis not present

## 2024-04-06 DIAGNOSIS — I350 Nonrheumatic aortic (valve) stenosis: Secondary | ICD-10-CM | POA: Diagnosis not present

## 2024-04-06 DIAGNOSIS — E1169 Type 2 diabetes mellitus with other specified complication: Secondary | ICD-10-CM | POA: Diagnosis not present

## 2024-04-06 NOTE — Telephone Encounter (Signed)
 Pt seen in ED this weekend, per Dr.Feng, pt called by this RN for a check in. Janet Williamson reports feeling well today, no pain, no N/V, diarrhea or constipation, pt complains of no symptoms at this time. When offered an appointment his week to see Dr.Feng pt declined stating she feels well. Pt verbalized agreement to call with any questions or concerns. No further needs at this time.

## 2024-04-07 ENCOUNTER — Other Ambulatory Visit: Payer: Self-pay

## 2024-04-13 ENCOUNTER — Encounter: Payer: Self-pay | Admitting: Hematology

## 2024-04-20 ENCOUNTER — Encounter: Payer: Self-pay | Admitting: Endocrinology

## 2024-04-20 DIAGNOSIS — E118 Type 2 diabetes mellitus with unspecified complications: Secondary | ICD-10-CM | POA: Diagnosis not present

## 2024-04-20 DIAGNOSIS — M8588 Other specified disorders of bone density and structure, other site: Secondary | ICD-10-CM | POA: Diagnosis not present

## 2024-04-20 DIAGNOSIS — I639 Cerebral infarction, unspecified: Secondary | ICD-10-CM | POA: Diagnosis not present

## 2024-04-20 DIAGNOSIS — M8580 Other specified disorders of bone density and structure, unspecified site: Secondary | ICD-10-CM | POA: Diagnosis not present

## 2024-04-20 DIAGNOSIS — I10 Essential (primary) hypertension: Secondary | ICD-10-CM | POA: Diagnosis not present

## 2024-04-21 ENCOUNTER — Encounter: Payer: Self-pay | Admitting: Internal Medicine

## 2024-04-21 ENCOUNTER — Other Ambulatory Visit: Payer: Self-pay

## 2024-04-21 ENCOUNTER — Ambulatory Visit (INDEPENDENT_AMBULATORY_CARE_PROVIDER_SITE_OTHER): Admitting: Internal Medicine

## 2024-04-21 VITALS — BP 146/75 | HR 69 | Temp 98.1°F | Wt 139.6 lb

## 2024-04-21 DIAGNOSIS — I339 Acute and subacute endocarditis, unspecified: Secondary | ICD-10-CM

## 2024-04-21 NOTE — Progress Notes (Signed)
 [facility]  Patient Active Problem List   Diagnosis Date Noted   Acute on chronic heart failure with preserved ejection fraction (HFpEF) (HCC) 02/15/2024   Stenosis of prosthetic aortic valve 02/15/2024   Port-A-Cath in place 01/15/2024   Genetic testing 12/04/2023   Malignant neoplasm of head of pancreas (HCC) 10/24/2023   History of GI bleed 04/21/2019   Symptomatic anemia 09/30/2018   S/P TAVR (transcatheter aortic valve replacement) 04/15/2018   Severe aortic stenosis    PAF (paroxysmal atrial fibrillation) (HCC) 03/04/2018   History of stroke 11/03/2014   Hypertension    DMII (diabetes mellitus, type 2) (HCC) 05/06/2013   HTN (hypertension) 05/06/2013   Anemia 05/06/2013      Subjective:    Patient ID: Janet Williamson, female    DOB: [birth year], 83 y.o.   MRN: [id number]  Chief Complaint  Patient presents with   Follow-up    No issues since stopping antbx.     HPI:  Janet Williamson is a 83 y.o. female here for f/u endocarditis   03/25/24 see a&p for detail No f/c No sob/cough At baseline health   04/21/24 id clinic f/u See a&p    Allergies  Allergen Reactions   Ace Inhibitors Swelling and Other (See Comments)    Angioedema    Keflex [Cephalexin] Swelling and Other (See Comments)    Lips became swollen   Rifadin [Rifampin] Swelling and Other (See Comments)    Lips became swollen      Outpatient Medications Prior to Visit  Medication Sig Dispense Refill   acetaminophen  (TYLENOL ) 325 MG tablet Take 325-650 mg by mouth every 8 (eight) hours as needed (for headaches).     acetaminophen -codeine  (TYLENOL  #3) 300-30 MG tablet Take 1 tablet by mouth every 8 (eight) hours as needed for moderate pain (pain score 4-6). (Patient not taking: Reported on 03/25/2024) 30 tablet 0   amLODipine  (NORVASC ) 5 MG tablet Take 5 mg by mouth daily.     baclofen  (LIORESAL ) 10 MG tablet Take 1 tablet (10 mg total) by mouth 3 (three)  times daily. 30 each 0   Blood Glucose Monitoring Suppl (ACCU-CHEK GUIDE ME) w/Device KIT by Does not apply route.     Blood Glucose Monitoring Suppl (ONE TOUCH ULTRA 2) w/Device KIT Use to check blood sugars three times daily DX: E11.9 1 kit 0   Cholecalciferol  (VITAMIN D3) 5000 units CAPS Take 5,000 Units by mouth daily.     CREON  36000-114000 units CPEP capsule TAKE TWO (2) CAPSULES BY MOUTH THREE TIMES A DAY WITH MEALS TAKE 1 CAPSULE BY MOUTH AS NEEDED WITH SNACK. MAX 10/DAY *NEW PRESCRIPTION REQUEST* 900 capsule 11   furosemide  (LASIX ) 20 MG tablet Take 1 tablet (20 mg total) by mouth daily. Can take an additional tablet as needed for weight gain     Lancets (ONETOUCH DELICA PLUS LANCET33G) MISC TEST BLOOD SUGAR THREE TIMES DAILY 300 each 10   lidocaine -prilocaine  (EMLA ) cream APPLY A QUARTER SIZE AMOUNT OF CREAM TO PORT - A- CATH 45 MINUTES TO 1 HOUR PRIOR TO PORT-A-CATH BEING ACCESS. *NEW PRESCRIPTION REQUEST* 90 g 11   loperamide  (IMODIUM ) 2 MG capsule Take 1 capsule (2 mg total) by mouth 4 (four) times daily as needed for diarrhea or loose stools. 12 capsule 0   metFORMIN  (GLUCOPHAGE -XR) 750 MG 24 hr tablet TAKE 2 TABLETS ONE TIME DAILY WITH BREAKFAST 180 tablet 2   metoprolol  tartrate (LOPRESSOR ) 25 MG  tablet Take 1 tablet (25 mg total) by mouth 2 (two) times daily. 60 tablet 3   ONETOUCH ULTRA TEST test strip TEST BLOOD SUGAR THREE TIMES DAILY 300 strip 3   pantoprazole  (PROTONIX ) 20 MG tablet TAKE 1 TABLET BY MOUTH DAILY *NEW PRESCRIPTION REQUEST* 90 tablet 11   Polyethyl Glycol-Propyl Glycol (SYSTANE OP) Place 1 drop into both eyes 2 (two) times daily.      rivaroxaban  (XARELTO ) 20 MG TABS tablet Take 1 tablet (20 mg total) by mouth daily with supper. 30 tablet 0   traMADol  (ULTRAM ) 50 MG tablet Take 0.5-1 tablets (25-50 mg total) by mouth every 6 (six) hours as needed. 30 tablet 0   Facility-Administered Medications Prior to Visit  Medication Dose Route Frequency Provider Last Rate  Last Admin   0.9 %  sodium chloride  infusion   Intravenous Continuous Lanny Callander, MD   Stopped at 12/11/23 1305   0.9 %  sodium chloride  infusion   Intravenous Continuous Lanny Callander, MD   Stopped at 12/11/23 1304     Social History   Socioeconomic History   Marital status: Widowed    Spouse name: Not on file   Number of children: Not on file   Years of education: Not on file   Highest education level: Not on file  Occupational History   Not on file  Tobacco Use   Smoking status: Never   Smokeless tobacco: Never  Vaping Use   Vaping status: Never Used  Substance and Sexual Activity   Alcohol use: No   Drug use: No   Sexual activity: Not Currently  Other Topics Concern   Not on file  Social History Narrative   Not on file   Social Drivers of Health   Financial Resource Strain: Not on file  Food Insecurity: No Food Insecurity (02/14/2024)   Hunger Vital Sign    Worried About Running Out of Food in the Last Year: Never true    Ran Out of Food in the Last Year: Never true  Transportation Needs: No Transportation Needs (02/14/2024)   PRAPARE - Administrator, Civil Service (Medical): No    Lack of Transportation (Non-Medical): No  Physical Activity: Not on file  Stress: Not on file  Social Connections: Unknown (02/14/2024)   Social Connection and Isolation Panel    Frequency of Communication with Friends and Family: More than three times a week    Frequency of Social Gatherings with Friends and Family: Three times a week    Attends Religious Services: 1 to 4 times per year    Active Member of Clubs or Organizations: Yes    Attends Banker Meetings: 1 to 4 times per year    Marital Status: Patient declined  Intimate Partner Violence: Not At Risk (02/14/2024)   Humiliation, Afraid, Rape, and Kick questionnaire    Fear of Current or Ex-Partner: No    Emotionally Abused: No    Physically Abused: No    Sexually Abused: No      Review of Systems    All  other ros negative  Objective:    BP (!) 146/75   Pulse 69   Temp 98.1 F (36.7 C) (Oral)   Wt 139 lb 9.6 oz (63.3 kg)   SpO2 99%   BMI 24.73 kg/m  Nursing note and vital signs reviewed.  Physical Exam      General/constitutional: no distress, pleasant; walks with walker HEENT: Normocephalic, PER, Conj Clear, EOMI, Oropharynx clear Neck  supple CV: rrr no mrg Lungs: clear to auscultation, normal respiratory effort Abd: Soft, Nontender Ext: no edema Skin: No Rash Neuro: nonfocal MSK: no peripheral joint swelling/tenderness/warmth; back spines nontender   Labs: See a&p  Micro:  Serology:  Imaging: Reviewed   03/25/24 tte   1. Left ventricular ejection fraction, by estimation, is 65 to 70%. Left  ventricular ejection fraction by 3D volume is 65 %. The left ventricle has  normal function. The left ventricle has no regional wall motion  abnormalities. There is moderate  concentric left ventricular hypertrophy. Left ventricular diastolic  parameters are consistent with Grade I diastolic dysfunction (impaired  relaxation). The average left ventricular global longitudinal strain is  -15.3 %. The global longitudinal strain is  normal.   2. Right ventricular systolic function is normal. The right ventricular  size is normal. There is moderately elevated pulmonary artery systolic  pressure. The estimated right ventricular systolic pressure is 52.9 mmHg.   3. Left atrial size was severely dilated.   4. Right atrial size was moderately dilated.   5. The mitral valve is degenerative. Moderate mitral valve regurgitation.  No evidence of mitral stenosis. Moderate mitral annular calcification.   6. Tricuspid valve regurgitation is moderate.   7. The TAVR valve appears markedly thickened. Gradients are stable since  study of 02/13/24 and are consistent with severe prosthetic valve AS . The  aortic valve has been repaired/replaced. Aortic valve regurgitation is  mild. No  aortic stenosis is present.   Procedure Date: 7//. Echo findings are consistent with normal structure  and function of the aortic valve prosthesis. Echo findings are consistent  with stenosis of the aortic prosthesis. Aortic valve area, by VTI measures  0.92 cm. Aortic valve mean  gradient measures 57.0 mmHg. Aortic valve Vmax measures 5.10 m/s.   8. Pulmonic valve regurgitation is moderate to severe.   9. Aortic dilatation noted. There is borderline dilatation of the  ascending aorta, measuring 38 mm.  10. The inferior vena cava is dilated in size with >50% respiratory  variability, suggesting right atrial pressure of 8 mmHg.   Assessment & Plan:   Problem List Items Addressed This Visit   None     No orders of the defined types were placed in this encounter.    Janet Williamson is a 83 y.o. female admitted with:    Acute Respiratory Failure -  Abnormal TTE -  Concerning for TAVR Endocarditis with New AV Stenosis / Insufficiency TAVR  Cardiology has seen - endocarditis vs leaflet thrombosis causing heart failure symptoms. Not a candidate for invasive procedures, including TEE based on notes. No positive blood cultures.  It is not terribly clear she has infection contributing to the problem. Resumed on anticoagulation (Xarelto  20 mg) given AFib and possible acute thrombosis of valve.  She had several of her daughters in the room today. Would like to go forward with antibiotics in light of uncertainty. Broad coverage with daptomycin  + ceftriaxone .  -Continue daptomycin  + ceftriaxone  for now for safer antibiotic profile x 6 weeks -OPAT outlined below.      Pancreatic Adenocarcinoma, On Chemotherapy s/p biliary stent -  Goals of Care -  D/W Dr. Patsy who discussed with Dr. Lisle with oncology with understanding to treat what is treatable. Verbalized to cardiology that she wanted full comfort measures  -Palliative medicine team has been consulted, notes reviewed and she would  like to pursue all efforts including oncology treatments in hopes to continue good QOL.  Vascular Access -  -PORT in place, no need for picc  -Home health orders to maintain port line care and education for patient described below    ------------ 03/25/24 id clinic assessment Presumed tavr endocarditis -- marantic vs infectious?? (incidental echo finding; negative bcx or sepsis) Is in her usual state of health today. Here with daughter.  No complaint Getting antibiotics through her port -- dapto/ceftriaxone  Not currently getting chemo for the pancreatic cancer -- last time chemo was  early 02/2024 Opat labs reviewed --  6/16 cbc 7.6/9/298; cr 0.66; sed rate 120; crp 67; cpk 255 6/09 crp 53; esr >120 Sed rate/crp high likely due to cancer  Finish antibiotics today 6/18; tte was done 6/18 pending read Follow up cardiology as discussed with them  Follow up with ID in about 4 weeks to see how she does off antibiotics  --------------- 04/21/24 id clinic assessment Patient is about 4 weeks out from finishing the IE abx course Followed up with oncology recently and off chemo quality of life/feeling much better Tte from 6/18 stable TAVR appearance with prosthetic valve AS No chf sx No fever, chill Appetite is good Again discussed with patient and thrombosis of the tavr ?marantic from cancer. We never had any hx of sepsis or positive blood culture Given how well she is feeling now will avoid surveillance blood cx or repeat sed rate/crp Monitor for another 4 weeks or so for sign of fever, peripheral joint pain/mid back pain, rash and follow up as needed Otherwise no planned ID follow up planned  No contraindication if chemo is needed, from id standpoint   Follow-up: No follow-ups on file.      Janet ONEIDA Passer, MD [facility] 04/21/2024, 9:04 AM

## 2024-04-22 DIAGNOSIS — C259 Malignant neoplasm of pancreas, unspecified: Secondary | ICD-10-CM | POA: Diagnosis not present

## 2024-04-22 DIAGNOSIS — I10 Essential (primary) hypertension: Secondary | ICD-10-CM | POA: Diagnosis not present

## 2024-04-22 DIAGNOSIS — E538 Deficiency of other specified B group vitamins: Secondary | ICD-10-CM | POA: Diagnosis not present

## 2024-04-22 DIAGNOSIS — E785 Hyperlipidemia, unspecified: Secondary | ICD-10-CM | POA: Diagnosis not present

## 2024-04-22 DIAGNOSIS — M8588 Other specified disorders of bone density and structure, other site: Secondary | ICD-10-CM | POA: Diagnosis not present

## 2024-04-22 DIAGNOSIS — Z23 Encounter for immunization: Secondary | ICD-10-CM | POA: Diagnosis not present

## 2024-04-22 DIAGNOSIS — E118 Type 2 diabetes mellitus with unspecified complications: Secondary | ICD-10-CM | POA: Diagnosis not present

## 2024-04-22 DIAGNOSIS — I480 Paroxysmal atrial fibrillation: Secondary | ICD-10-CM | POA: Diagnosis not present

## 2024-04-27 ENCOUNTER — Other Ambulatory Visit (HOSPITAL_COMMUNITY)

## 2024-04-28 ENCOUNTER — Encounter: Payer: Self-pay | Admitting: Nurse Practitioner

## 2024-04-28 ENCOUNTER — Inpatient Hospital Stay (HOSPITAL_BASED_OUTPATIENT_CLINIC_OR_DEPARTMENT_OTHER): Admitting: Nurse Practitioner

## 2024-04-28 ENCOUNTER — Inpatient Hospital Stay

## 2024-04-28 ENCOUNTER — Inpatient Hospital Stay: Attending: Nurse Practitioner | Admitting: Hematology

## 2024-04-28 VITALS — BP 133/67 | HR 60 | Temp 97.5°F | Resp 18 | Wt 141.9 lb

## 2024-04-28 DIAGNOSIS — G893 Neoplasm related pain (acute) (chronic): Secondary | ICD-10-CM

## 2024-04-28 DIAGNOSIS — C250 Malignant neoplasm of head of pancreas: Secondary | ICD-10-CM

## 2024-04-28 DIAGNOSIS — Z515 Encounter for palliative care: Secondary | ICD-10-CM

## 2024-04-28 DIAGNOSIS — R530 Neoplastic (malignant) related fatigue: Secondary | ICD-10-CM | POA: Diagnosis not present

## 2024-04-28 DIAGNOSIS — Z95828 Presence of other vascular implants and grafts: Secondary | ICD-10-CM

## 2024-04-28 LAB — CBC WITH DIFFERENTIAL (CANCER CENTER ONLY)
Abs Immature Granulocytes: 0.01 K/uL (ref 0.00–0.07)
Basophils Absolute: 0.1 K/uL (ref 0.0–0.1)
Basophils Relative: 1 %
Eosinophils Absolute: 0.2 K/uL (ref 0.0–0.5)
Eosinophils Relative: 2 %
HCT: 30.4 % — ABNORMAL LOW (ref 36.0–46.0)
Hemoglobin: 9.4 g/dL — ABNORMAL LOW (ref 12.0–15.0)
Immature Granulocytes: 0 %
Lymphocytes Relative: 37 %
Lymphs Abs: 2.7 K/uL (ref 0.7–4.0)
MCH: 22.9 pg — ABNORMAL LOW (ref 26.0–34.0)
MCHC: 30.9 g/dL (ref 30.0–36.0)
MCV: 74 fL — ABNORMAL LOW (ref 80.0–100.0)
Monocytes Absolute: 0.4 K/uL (ref 0.1–1.0)
Monocytes Relative: 6 %
Neutro Abs: 3.9 K/uL (ref 1.7–7.7)
Neutrophils Relative %: 54 %
Platelet Count: 315 K/uL (ref 150–400)
RBC: 4.11 MIL/uL (ref 3.87–5.11)
RDW: 17.6 % — ABNORMAL HIGH (ref 11.5–15.5)
Smear Review: NORMAL
WBC Count: 7.2 K/uL (ref 4.0–10.5)
nRBC: 0 % (ref 0.0–0.2)

## 2024-04-28 LAB — CMP (CANCER CENTER ONLY)
ALT: 6 U/L (ref 0–44)
AST: 12 U/L — ABNORMAL LOW (ref 15–41)
Albumin: 3.5 g/dL (ref 3.5–5.0)
Alkaline Phosphatase: 50 U/L (ref 38–126)
Anion gap: 4 — ABNORMAL LOW (ref 5–15)
BUN: 14 mg/dL (ref 8–23)
CO2: 32 mmol/L (ref 22–32)
Calcium: 9.6 mg/dL (ref 8.9–10.3)
Chloride: 102 mmol/L (ref 98–111)
Creatinine: 0.63 mg/dL (ref 0.44–1.00)
GFR, Estimated: >60 mL/min (ref >60)
Glucose, Bld: 123 mg/dL — ABNORMAL HIGH (ref 70–99)
Potassium: 3.9 mmol/L (ref 3.5–5.1)
Sodium: 138 mmol/L (ref 135–145)
Total Bilirubin: 0.2 mg/dL (ref 0.0–1.2)
Total Protein: 7.4 g/dL (ref 6.5–8.1)

## 2024-04-28 MED ORDER — HEPARIN SOD (PORK) LOCK FLUSH 100 UNIT/ML IV SOLN
500.0000 [IU] | Freq: Once | INTRAVENOUS | Status: AC
Start: 2024-04-28 — End: 2024-04-28
  Administered 2024-04-28: 500 [IU]

## 2024-04-28 MED ORDER — SODIUM CHLORIDE 0.9% FLUSH
10.0000 mL | Freq: Once | INTRAVENOUS | Status: AC
Start: 2024-04-28 — End: 2024-04-28
  Administered 2024-04-28: 10 mL

## 2024-04-28 NOTE — Progress Notes (Signed)
 Palliative Medicine [facility]  [phone] [phone]   Name: Janet Williamson Date: 04/28/2024 MRN: [id number]  DOB: 1940-11-10  Patient Care Team: Leonel Cole, MD as PCP - General (Family Medicine) Jeffrie Oneil BROCKS, MD as PCP - Cardiology (Cardiology) Lanny Callander, MD as Consulting Physician (Hematology) Burnette Fallow, MD as Consulting Physician (Gastroenterology) Rosalie Kitchens, MD as Consulting Physician (Gastroenterology) Pickenpack-Cousar, Fannie SAILOR, NP as Nurse Practitioner (Hospice and Palliative Medicine)    INTERVAL HISTORY: Janet Williamson is a 83 y.o. female with  oncologic medical history including pancreatic cancer (08/2023) not a further candidate for further treatment due to age and recent hospitalization for endorcarditis (02/2024).  Palliative is seeing patient for symptom management and goals of care.   SOCIAL HISTORY:     reports that she has never smoked. She has never used smokeless tobacco. She reports that she does not drink alcohol and does not use drugs.  ADVANCE DIRECTIVES:  None on file  CODE STATUS: DNR  PAST MEDICAL HISTORY: 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 Not seen on most recent TEE 2019.   Cardiomyopathy in other disease    Cataracts, bilateral    Diabetes mellitus    Type 2   Generalized osteoarthritis    GERD (gastroesophageal reflux disease)    HLD (hyperlipidemia)    Hypertension    Hypothyroidism    LBBB (left bundle branch block)    PAF (paroxysmal atrial fibrillation) (HCC)    a. s/p DCCV 06/2017, on Xarelto    Phlebitis    S/P TAVR (transcatheter aortic valve replacement) 04/15/2018   Edwards Sapien 3 THV (size 23 mm, model # 9600TFX, serial # [id number]) via the TF approach   Severe aortic stenosis    a. s/p TAVR 04/2018.   Stroke [facility]) 2008    ALLERGIES:  is allergic to ace  inhibitors, keflex [cephalexin], and rifadin [rifampin].  MEDICATIONS:  Current Outpatient Medications  Medication Sig Dispense Refill   acetaminophen  (TYLENOL ) 325 MG tablet Take 325-650 mg by mouth every 8 (eight) hours as needed (for headaches).     acetaminophen -codeine  (TYLENOL  #3) 300-30 MG tablet Take 1 tablet by mouth every 8 (eight) hours as needed for moderate pain (pain score 4-6). (Patient not taking: Reported on 03/25/2024) 30 tablet 0   amLODipine  (NORVASC ) 5 MG tablet Take 5 mg by mouth daily.     baclofen  (LIORESAL ) 10 MG tablet Take 1 tablet (10 mg total) by mouth 3 (three) times daily. 30 each 0   Blood Glucose Monitoring Suppl (ACCU-CHEK GUIDE ME) w/Device KIT by Does not apply route.     Blood Glucose Monitoring Suppl (ONE TOUCH ULTRA 2) w/Device KIT Use to check blood sugars three times daily DX: E11.9 1 kit 0   Cholecalciferol  (VITAMIN D3) 5000 units CAPS Take 5,000 Units by mouth daily.     CREON  36000-114000 units CPEP capsule TAKE TWO (2) CAPSULES BY MOUTH THREE TIMES A DAY WITH MEALS TAKE 1 CAPSULE BY MOUTH AS NEEDED WITH SNACK. MAX 10/DAY *NEW PRESCRIPTION REQUEST* 900 capsule 11   furosemide  (LASIX ) 20 MG tablet Take 1 tablet (20 mg total) by mouth daily. Can take an additional tablet as needed for weight gain     Lancets (ONETOUCH DELICA PLUS LANCET33G) MISC TEST BLOOD SUGAR THREE TIMES DAILY 300 each 10   lidocaine -prilocaine  (EMLA ) cream  APPLY A QUARTER SIZE AMOUNT OF CREAM TO PORT - A- CATH 45 MINUTES TO 1 HOUR PRIOR TO PORT-A-CATH BEING ACCESS. *NEW PRESCRIPTION REQUEST* 90 g 11   loperamide  (IMODIUM ) 2 MG capsule Take 1 capsule (2 mg total) by mouth 4 (four) times daily as needed for diarrhea or loose stools. 12 capsule 0   metFORMIN  (GLUCOPHAGE -XR) 750 MG 24 hr tablet TAKE 2 TABLETS ONE TIME DAILY WITH BREAKFAST 180 tablet 2   metoprolol  tartrate (LOPRESSOR ) 25 MG tablet Take 1 tablet (25 mg total) by mouth 2 (two) times daily. 60 tablet 3   ONETOUCH ULTRA TEST  test strip TEST BLOOD SUGAR THREE TIMES DAILY 300 strip 3   pantoprazole  (PROTONIX ) 20 MG tablet TAKE 1 TABLET BY MOUTH DAILY *NEW PRESCRIPTION REQUEST* 90 tablet 11   Polyethyl Glycol-Propyl Glycol (SYSTANE OP) Place 1 drop into both eyes 2 (two) times daily.      rivaroxaban  (XARELTO ) 20 MG TABS tablet Take 1 tablet (20 mg total) by mouth daily with supper. 30 tablet 0   traMADol  (ULTRAM ) 50 MG tablet Take 0.5-1 tablets (25-50 mg total) by mouth every 6 (six) hours as needed. 30 tablet 0   No current facility-administered medications for this visit.   Facility-Administered Medications Ordered in Other Visits  Medication Dose Route Frequency Provider Last Rate Last Admin   0.9 %  sodium chloride  infusion   Intravenous Continuous Lanny Callander, MD   Stopped at 12/11/23 1305   0.9 %  sodium chloride  infusion   Intravenous Continuous Lanny Callander, MD   Stopped at 12/11/23 1304    VITAL SIGNS: BP 133/67 (BP Location: Left Arm, Patient Position: Sitting, Cuff Size: Normal)   Pulse 60   Temp (!) 97.5 F (36.4 C)   Resp 18   Wt 141 lb 14.4 oz (64.4 kg)   SpO2 100%   BMI 25.14 kg/m  Filed Weights   04/28/24 0927  Weight: 141 lb 14.4 oz (64.4 kg)    Estimated body mass index is 25.14 kg/m as calculated from the following:   Height as of 04/05/24: 5' 3 (1.6 m).   Weight as of this encounter: 141 lb 14.4 oz (64.4 kg).   PERFORMANCE STATUS (ECOG) : 1 - Symptomatic but completely ambulatory  Physical Exam General: NAD Cardiovascular: regular rate and rhythm Pulmonary: normal breathing pattern Extremities: no edema, no joint deformities Skin: no rashes Neurological: AAO x3  IMPRESSION: Discussed the use of AI scribe software for clinical note transcription with the patient, who gave verbal consent to proceed.  History of Present Illness Janet Williamson is an 83 year old female with pancreatic cancer who presents for a routine follow-up.  No acute distress noted. Patient is ambulatory  with a rollator.  States she is doing well overall.  She notes fluctuations in her appetite and describes having a 'funny taste' in her mouth at times, affecting her desire to eat. Despite this, her weight has increased from 138 to 141 pounds.  She experiences sleep disturbances, waking up around 4 AM and having difficulty returning to sleep, but she is able to sleep again around 7:30 or 8 AM. Overall sleep pattern is disrupted but manageable.  No recent falls. No constipation, diarrhea, nausea, or vomiting.  She experiences occasional pain, managed with Tylenol , taking two extra strength tablets as needed. Tramadol  is available for more severe pain but is not frequently used.  All symptoms well managed. Janet Williamson continues to take things one day at a time. All  questions answered and support provided.   Goals of care 03/11/2024:We discussed her current illness and what it means in the larger context of her on-going co-morbidities. Natural disease trajectory and expectations were discussed. Janet Williamson and her granddaughter are realistic in their understanding of her terminal cancer with no further treatment options.    She is clear in expressed wishes to continue to treat the treatable and aggressively manage her symptoms at this time. I empathetically approached discussions regarding healthcare limitations.    Education provided on the differences, goals and philosophy, and what care would look like with palliative versus hospice. Janet Williamson is clear that she is not interested in pursuing hospice care at this time. She shares she almost did not want to come to her appointment today with myself out of fear she was being enrolled in hospice. I had open discussion with patient sharing at a point hospice would be appropriate for her care to allow her to spend what time she has left comfortable amongst her family. She verbalized understanding and appreciation.    Patient is a DNR/DNI as confirmed.  We will continue ongoing goals of care discussions as needed.   I discussed the importance of continued conversation with family and their medical providers regarding overall plan of care and treatment options, ensuring decisions are within the context of the patients values and GOCs. Assessment & Plan Cancer related pain and lower back and leg Pain is well-controlled with current regimen of Tylenol  and occasional tramadol  for moderate to severe pain. - Continue Tylenol  as needed. - Use tramadol  for moderate to severe pain as needed.  Goals of Care Patient and family expressed clear understanding of terminal pancreatic cancer. They are currently not interested in enrolling in hospice.  -Goals clear to continue treating the treatable allowing patient an opportunity to continue thriving while aggressively managing symptoms.  -DNR/DNI -Direct discussion regarding need for hospice in the future as health further declines.  -We will continue goals of care discussions offering support to patient and family as they navigate complex decisions.   No current uncontrolled symptoms. - Follow up in 6-8 weeks.  Sooner if needed.   Patient expressed understanding and was in agreement with this plan. She also understands that She can call the clinic at any time with any questions, concerns, or complaints.   Any controlled substances utilized were prescribed in the context of palliative care. PDMP has been reviewed.   Visit consisted of counseling and education dealing with the complex and emotionally intense issues of symptom management and palliative care in the setting of serious and potentially life-threatening illness.  Levon Borer, AGPCNP-BC  Palliative Medicine Team/[facility]

## 2024-04-28 NOTE — Assessment & Plan Note (Signed)
 T3N0 by EUS, questionable peritoneal carcinomatosis -She had an episode of abdominal pain, nausea, and dizziness at church and came to ED 08/18/2023 -CT AP done 10/08/2023 showing a stable solid right lower lobe pulmonary nodule from 2019 and an ill-defined hypodense lesion in the pancreatic head measuring 4.4 x 3.9 x 3.3 cm obstructing the pancreatic and CBD appearing to encase and occlude the SMV and portal confluence with soft tissue stranding extending along the mesenteric root with nodular foci peritoneal soft tissue nodularity anterior to the mass; overall concerning for primary pancreatic malignancy with peritoneal carcinomatosis.    -EUS 10/16/23 by Dr. Burnette showing a T3 N0 pancreatic head mass invading the SMA and ERCP with stenting by Dr. Rosalie, cytology confirming adenocarcinoma.  -Due to her age, the vascular invasion/abutment of the SMA, and possible peritoneal involvement, she is likely not a surgical candidate but Dr. Aron has reviewed her case and agreed to see the pt for surgical discussion. She was seen on 11/26/2023 and felt to be a poor candidate for surgery  -PET scan showed no definite evidence of metastatic disease.  The questionable peritoneal involvement on CT is felt to be local extension -plan to start chemo gemcitabine  alone, and may add Abraxane  from second cycle if tolerates well. She started chemo on 11/13/2023, she did not tolerate gem/Abraxane , and chemo changed back to gemcitabine  alone  -restaging CT 4/23 showed sable pancreatic mass but a new 1.1cm liver lesion which is suspicious for new met, which also appears to be suspicious on liver MRI in early May -Pt was hospitalized for acute endocarditis on 02/12/2024 and she will continue antibiotics for 6 weeks -chemo held and she switched to supportive care alone

## 2024-04-28 NOTE — Progress Notes (Signed)
 [facility]   [phone]) [phone] [phone]   Clinic Follow up Note   Patient Care Team: Leonel Cole, MD as PCP - General (Family Medicine) Jeffrie Oneil BROCKS, MD as PCP - Cardiology (Cardiology) Lanny Callander, MD as Consulting Physician (Hematology) Burnette Fallow, MD as Consulting Physician (Gastroenterology) Rosalie Kitchens, MD as Consulting Physician (Gastroenterology) Pickenpack-Cousar, Fannie SAILOR, NP as Nurse Practitioner [facility] and Palliative Medicine)  Date of Service:  04/28/2024  CHIEF COMPLAINT: f/u of pancreatic  cancer   CURRENT THERAPY:  Supportive care   Oncology History   Malignant neoplasm of head of pancreas (HCC) T3N0 by EUS, questionable peritoneal carcinomatosis -She had an episode of abdominal pain, nausea, and dizziness at church and came to ED 08/18/2023 -CT AP done 10/08/2023 showing a stable solid right lower lobe pulmonary nodule from 2019 and an ill-defined hypodense lesion in the pancreatic head measuring 4.4 x 3.9 x 3.3 cm obstructing the pancreatic and CBD appearing to encase and occlude the SMV and portal confluence with soft tissue stranding extending along the mesenteric root with nodular foci peritoneal soft tissue nodularity anterior to the mass; overall concerning for primary pancreatic malignancy with peritoneal carcinomatosis.    -EUS 10/16/23 by Dr. Burnette showing a T3 N0 pancreatic head mass invading the SMA and ERCP with stenting by Dr. Rosalie, cytology confirming adenocarcinoma.  -Due to her age, the vascular invasion/abutment of the SMA, and possible peritoneal involvement, she is likely not a surgical candidate but Dr. Aron has reviewed her case and agreed to see the pt for surgical discussion. She was seen on 11/26/2023 and felt to be a poor candidate for surgery  -PET scan showed no definite evidence of metastatic disease.  The questionable peritoneal involvement on CT is felt to be local extension -plan to start chemo  gemcitabine  alone, and may add Abraxane  from second cycle if tolerates well. She started chemo on 11/13/2023, she did not tolerate gem/Abraxane , and chemo changed back to gemcitabine  alone  -restaging CT 4/23 showed sable pancreatic mass but a new 1.1cm liver lesion which is suspicious for new met, which also appears to be suspicious on liver MRI in early May -Pt was hospitalized for acute endocarditis on 02/12/2024 and she will continue antibiotics for 6 weeks -chemo held and she switched to supportive care alone   Assessment & Plan Pancreatic cancer Chemotherapy discontinued, focusing on supportive care. No new symptoms, occasional pain managed with acetaminophen . Good appetite, weight gain. Stable blood counts, hemoglobin at 9.4, normal renal and hepatic function. Prefers clinic follow-up over home care. - Continue supportive care without chemotherapy. - Manage pain with acetaminophen  as needed. - Continue Creon  for pancreatic enzyme replacement. - Schedule follow-up appointment in six weeks. - Advise to call with any questions or concerns.     SUMMARY OF ONCOLOGIC HISTORY: Oncology History  Malignant neoplasm of head of pancreas (HCC)  10/16/2023 Cancer Staging   Staging form: Exocrine Pancreas, AJCC 8th Edition - Clinical stage from 10/16/2023: Stage IIA (cT3, cN0, cM0) - Signed by Lanny Callander, MD on 11/20/2023 Total positive nodes: 0   10/24/2023 Initial Diagnosis   Malignant neoplasm of head of pancreas (HCC)   11/13/2023 -  Chemotherapy   Patient is on Treatment Plan : PANCREATIC Abraxane  D1,8,15 + Gemcitabine  D1,8,15 q28d     12/04/2023 Genetic Testing   Negative genetic testing on the CancerNext+RNA panel.  BRCA1  p.D695N (c.2083G>A)  VUS identified.  The report date is 12/03/2023.  The [facility] CancerNext+RNAinsight Panel includes sequencing,  rearrangement analysis, and RNA analysis for the following 39 genes: APC, ATM, BAP1, BARD1, BMPR1A, BRCA1, BRCA2, BRIP1, CDH1, CDKN2A, CHEK2, FH,  FLCN, MET, MLH1, MSH2, MSH6, MUTYH, NF1, NTHL1, PALB2, PMS2, PTEN, RAD51C, RAD51D, SMAD4, STK11, TP53, TSC1, TSC2, and VHL (sequencing and deletion/duplication); AXIN2, HOXB13, MBD4, MSH3, POLD1 and POLE (sequencing only); EPCAM and GREM1 (deletion/duplication only).       Discussed the use of AI scribe software for clinical note transcription with the patient, who gave verbal consent to proceed.  History of Present Illness Janet Williamson is an 83 year old female with pancreatic cancer who presents for follow-up.  She has not experienced any new symptoms since her last visit over a month ago. Occasional stomach pain is relieved by Tylenol , with no persistent pain or bloating. Her appetite remains good, and she has gained weight, currently at 141 pounds. She is not taking any nausea or pain medications. She continues to take Creon  without issues and has no diarrhea. Her port is in place with no problems.     All other systems were reviewed with the patient and are negative.  MEDICAL HISTORY:  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 Not seen on most recent TEE 2019.   Cardiomyopathy in other disease    Cataracts, bilateral    Diabetes mellitus    Type 2   Generalized osteoarthritis    GERD (gastroesophageal reflux disease)    HLD (hyperlipidemia)    Hypertension    Hypothyroidism    LBBB (left bundle branch block)    PAF (paroxysmal atrial fibrillation) (HCC)    a. s/p DCCV 06/2017, on Xarelto    Phlebitis    S/P TAVR (transcatheter aortic valve replacement) 04/15/2018   Edwards Sapien 3 THV (size 23 mm, model # 9600TFX, serial # [id number]) via the TF approach   Severe aortic stenosis    a. s/p TAVR 04/2018.   Stroke [facility]) 2008    SURGICAL HISTORY: Past Surgical History:  Procedure Laterality Date   ABDOMINAL HYSTERECTOMY     BILIARY BRUSHING  10/16/2023   Procedure:  BILIARY BRUSHING;  Surgeon: Burnette Fallow, MD;  Location: [facility];  Service: Gastroenterology;;   BILIARY STENT PLACEMENT N/A 10/16/2023   Procedure: BILIARY STENT PLACEMENT;  Surgeon: Burnette Fallow, MD;  Location: [facility];  Service: Gastroenterology;  Laterality: N/A;   BIOPSY  05/19/2019   Procedure: BIOPSY;  Surgeon: Donnald Charleston, MD;  Location: [facility] ENDOSCOPY;  Service: Endoscopy;;   BREAST BIOPSY Left    years ago at a doctor's office   COLONOSCOPY     COLONOSCOPY WITH PROPOFOL  N/A 10/02/2018   Procedure: COLONOSCOPY WITH PROPOFOL ;  Surgeon: Burnette Fallow, MD;  Location: [facility] ENDOSCOPY;  Service: Endoscopy;  Laterality: N/A;   ENTEROSCOPY N/A 05/19/2019   Procedure: ENTEROSCOPY;  Surgeon: Donnald Charleston, MD;  Location: [facility] ENDOSCOPY;  Service: Endoscopy;  Laterality: N/A;   ERCP N/A 10/16/2023   Procedure: ENDOSCOPIC RETROGRADE CHOLANGIOPANCREATOGRAPHY (ERCP);  Surgeon: Burnette Fallow, MD;  Location: [facility];  Service: Gastroenterology;  Laterality: N/A;   ESOPHAGOGASTRODUODENOSCOPY (EGD) WITH PROPOFOL  N/A 10/02/2018   Procedure: ESOPHAGOGASTRODUODENOSCOPY (EGD) WITH PROPOFOL ;  Surgeon: Burnette Fallow, MD;  Location: [facility] ENDOSCOPY;  Service: Endoscopy;  Laterality: N/A;   ESOPHAGOGASTRODUODENOSCOPY (EGD) WITH PROPOFOL  N/A 04/23/2019   Procedure: ESOPHAGOGASTRODUODENOSCOPY (EGD) WITH PROPOFOL ;  Surgeon: Lennard Lesta FALCON, MD;  Location: [facility] ENDOSCOPY;  Service: Endoscopy;  Laterality: N/A;   ESOPHAGOGASTRODUODENOSCOPY (EGD) WITH PROPOFOL  N/A 10/16/2023   Procedure: ESOPHAGOGASTRODUODENOSCOPY (EGD) WITH PROPOFOL ;  Surgeon: Burnette Fallow, MD;  Location: [facility];  Service: Gastroenterology;  Laterality: N/A;   EUS N/A 10/16/2023   Procedure: FULL UPPER ENDOSCOPIC ULTRASOUND (EUS) RADIAL;  Surgeon: Burnette Fallow, MD;  Location: [facility];  Service: Gastroenterology;  Laterality: N/A;   EYE SURGERY Bilateral    cataract removal   FINE NEEDLE ASPIRATION N/A 10/16/2023    Procedure: FINE NEEDLE ASPIRATION (FNA) LINEAR;  Surgeon: Burnette Fallow, MD;  Location: [facility];  Service: Gastroenterology;  Laterality: N/A;   GIVENS CAPSULE STUDY N/A 10/02/2018   Procedure: GIVENS CAPSULE STUDY;  Surgeon: Burnette Fallow, MD;  Location: [facility] ENDOSCOPY;  Service: Endoscopy;  Laterality: N/A;   IR IMAGING GUIDED PORT INSERTION  01/10/2024   RIGHT/LEFT HEART CATH AND CORONARY ANGIOGRAPHY N/A 03/06/2018   Procedure: RIGHT/LEFT HEART CATH AND CORONARY ANGIOGRAPHY;  Surgeon: Claudene Victory ORN, MD;  Location: [facility] INVASIVE CV LAB;  Service: Cardiovascular;  Laterality: N/A;   SMALL BOWEL ENTEROSCOPY  05/19/2019   SPHINCTEROTOMY  10/16/2023   Procedure: SPHINCTEROTOMY;  Surgeon: Burnette Fallow, MD;  Location: [facility];  Service: Gastroenterology;;   TEE WITHOUT CARDIOVERSION N/A 04/01/2018   Procedure: TRANSESOPHAGEAL ECHOCARDIOGRAM (TEE);  Surgeon: Jeffrie Oneil BROCKS, MD;  Location: [facility] ENDOSCOPY;  Service: Cardiovascular;  Laterality: N/A;   TEE WITHOUT CARDIOVERSION N/A 04/15/2018   Procedure: TRANSESOPHAGEAL ECHOCARDIOGRAM (TEE);  Surgeon: Wonda Sharper, MD;  [facility];  Service: Open Heart Surgery;  Laterality: N/A;   TOTAL KNEE ARTHROPLASTY Right 04/14/2013   Dr Addie   TOTAL KNEE ARTHROPLASTY Right 04/14/2013   Procedure: TOTAL KNEE ARTHROPLASTY;  Surgeon: Cordella Glendia Addie, MD;  Location: [facility] OR;  Service: Orthopedics;  Laterality: Right;   TRANSCATHETER AORTIC VALVE REPLACEMENT, TRANSFEMORAL  04/15/2018   TRANSCATHETER AORTIC VALVE REPLACEMENT, TRANSFEMORAL Bilateral 04/15/2018   Procedure: TRANSCATHETER AORTIC VALVE REPLACEMENT, TRANSFEMORAL;  Surgeon: Wonda Sharper, MD;  Location: [facility] OR;  Service: Open Heart Surgery;  Laterality: Bilateral;    I have reviewed the social history and family history with the patient and they are unchanged from previous note.  ALLERGIES:  is allergic to ace inhibitors, keflex [cephalexin], and rifadin [rifampin].  MEDICATIONS:  Current  Outpatient Medications  Medication Sig Dispense Refill   acetaminophen  (TYLENOL ) 325 MG tablet Take 325-650 mg by mouth every 8 (eight) hours as needed (for headaches).     acetaminophen -codeine  (TYLENOL  #3) 300-30 MG tablet Take 1 tablet by mouth every 8 (eight) hours as needed for moderate pain (pain score 4-6). (Patient not taking: Reported on 03/25/2024) 30 tablet 0   amLODipine  (NORVASC ) 5 MG tablet Take 5 mg by mouth daily.     baclofen  (LIORESAL ) 10 MG tablet Take 1 tablet (10 mg total) by mouth 3 (three) times daily. 30 each 0   Blood Glucose Monitoring Suppl (ACCU-CHEK GUIDE ME) w/Device KIT by Does not apply route.     Blood Glucose Monitoring Suppl (ONE TOUCH ULTRA 2) w/Device KIT Use to check blood sugars three times daily DX: E11.9 1 kit 0   Cholecalciferol  (VITAMIN D3) 5000 units CAPS Take 5,000 Units by mouth daily.     CREON  36000-114000 units CPEP capsule TAKE TWO (2) CAPSULES BY MOUTH THREE TIMES A DAY WITH MEALS TAKE 1 CAPSULE BY MOUTH AS NEEDED WITH SNACK. MAX 10/DAY *NEW PRESCRIPTION REQUEST* 900 capsule 11   furosemide  (LASIX ) 20 MG tablet Take 1 tablet (20 mg total) by mouth daily. Can take an  additional tablet as needed for weight gain     Lancets (ONETOUCH DELICA PLUS LANCET33G) MISC TEST BLOOD SUGAR THREE TIMES DAILY 300 each 10   lidocaine -prilocaine  (EMLA ) cream APPLY A QUARTER SIZE AMOUNT OF CREAM TO PORT - A- CATH 45 MINUTES TO 1 HOUR PRIOR TO PORT-A-CATH BEING ACCESS. *NEW PRESCRIPTION REQUEST* 90 g 11   loperamide  (IMODIUM ) 2 MG capsule Take 1 capsule (2 mg total) by mouth 4 (four) times daily as needed for diarrhea or loose stools. 12 capsule 0   metFORMIN  (GLUCOPHAGE -XR) 750 MG 24 hr tablet TAKE 2 TABLETS ONE TIME DAILY WITH BREAKFAST 180 tablet 2   metoprolol  tartrate (LOPRESSOR ) 25 MG tablet Take 1 tablet (25 mg total) by mouth 2 (two) times daily. 60 tablet 3   ONETOUCH ULTRA TEST test strip TEST BLOOD SUGAR THREE TIMES DAILY 300 strip 3   pantoprazole  (PROTONIX )  20 MG tablet TAKE 1 TABLET BY MOUTH DAILY *NEW PRESCRIPTION REQUEST* 90 tablet 11   Polyethyl Glycol-Propyl Glycol (SYSTANE OP) Place 1 drop into both eyes 2 (two) times daily.      rivaroxaban  (XARELTO ) 20 MG TABS tablet Take 1 tablet (20 mg total) by mouth daily with supper. 30 tablet 0   traMADol  (ULTRAM ) 50 MG tablet Take 0.5-1 tablets (25-50 mg total) by mouth every 6 (six) hours as needed. 30 tablet 0   No current facility-administered medications for this visit.   Facility-Administered Medications Ordered in Other Visits  Medication Dose Route Frequency Provider Last Rate Last Admin   0.9 %  sodium chloride  infusion   Intravenous Continuous Lanny Callander, MD   Stopped at 12/11/23 1305   0.9 %  sodium chloride  infusion   Intravenous Continuous Lanny Callander, MD   Stopped at 12/11/23 1304    PHYSICAL EXAMINATION: ECOG PERFORMANCE STATUS: 1 - Symptomatic but completely ambulatory  There were no vitals filed for this visit. Wt Readings from Last 3 Encounters:  04/28/24 141 lb 14.4 oz (64.4 kg)  04/21/24 139 lb 9.6 oz (63.3 kg)  04/05/24 143 lb (64.9 kg)     GENERAL:alert, no distress and comfortable SKIN: skin color, texture, turgor are normal, no rashes or significant lesions EYES: normal, Conjunctiva are pink and non-injected, sclera clear NECK: supple, thyroid  normal size, non-tender, without nodularity LYMPH:  no palpable lymphadenopathy in the cervical, axillary  LUNGS: clear to auscultation and percussion with normal breathing effort HEART: regular rate & rhythm and no murmurs and no lower extremity edema ABDOMEN:abdomen soft, non-tender and normal bowel sounds Musculoskeletal:no cyanosis of digits and no clubbing  NEURO: alert & oriented x 3 with fluent speech, no focal motor/sensory deficits  Physical Exam   LABORATORY DATA:  I have reviewed the data as listed    Latest Ref Rng & Units 04/28/2024    8:54 AM 04/05/2024    1:45 PM 03/31/2024    3:52 PM  CBC  WBC 4.0 - 10.5  K/uL 7.2  6.0  8.5   Hemoglobin 12.0 - 15.0 g/dL 9.4  89.4  9.4   Hematocrit 36.0 - 46.0 % 30.4  35.7  30.4   Platelets 150 - 400 K/uL 315  391  400         Latest Ref Rng & Units 04/28/2024    8:54 AM 04/05/2024    1:45 PM 03/31/2024    3:52 PM  CMP  Glucose 70 - 99 mg/dL 876  899  99   BUN 8 - 23 mg/dL 14  11  10  Creatinine 0.44 - 1.00 mg/dL 9.36  9.34  9.41   Sodium 135 - 145 mmol/L 138  135  134   Potassium 3.5 - 5.1 mmol/L 3.9  4.4  4.1   Chloride 98 - 111 mmol/L 102  97  99   CO2 22 - 32 mmol/L 32  28  31   Calcium  8.9 - 10.3 mg/dL 9.6  89.9  89.8   Total Protein 6.5 - 8.1 g/dL 7.4  7.8  7.6   Total Bilirubin 0.0 - 1.2 mg/dL 0.2  0.7  0.3   Alkaline Phos 38 - 126 U/L 50  54  52   AST 15 - 41 U/L 12  24  11    ALT 0 - 44 U/L 6  10  7        RADIOGRAPHIC STUDIES: I have personally reviewed the radiological images as listed and agreed with the findings in the report. No results found.    No orders of the defined types were placed in this encounter.  All questions were answered. The patient knows to call the clinic with any problems, questions or concerns. No barriers to learning was detected. The total time spent in the appointment was 20 minutes, including review of chart and various tests results, discussions about plan of care and coordination of care plan     Onita Mattock, MD 04/28/2024

## 2024-04-29 DIAGNOSIS — E119 Type 2 diabetes mellitus without complications: Secondary | ICD-10-CM | POA: Diagnosis not present

## 2024-04-29 DIAGNOSIS — H524 Presbyopia: Secondary | ICD-10-CM | POA: Diagnosis not present

## 2024-04-29 LAB — CANCER ANTIGEN 19-9: CA 19-9: <2 U/mL (ref 0–35)

## 2024-05-07 DIAGNOSIS — I350 Nonrheumatic aortic (valve) stenosis: Secondary | ICD-10-CM | POA: Diagnosis not present

## 2024-05-07 DIAGNOSIS — E1169 Type 2 diabetes mellitus with other specified complication: Secondary | ICD-10-CM | POA: Diagnosis not present

## 2024-05-07 DIAGNOSIS — M1990 Unspecified osteoarthritis, unspecified site: Secondary | ICD-10-CM | POA: Diagnosis not present

## 2024-05-07 DIAGNOSIS — E1165 Type 2 diabetes mellitus with hyperglycemia: Secondary | ICD-10-CM | POA: Diagnosis not present

## 2024-05-07 DIAGNOSIS — I480 Paroxysmal atrial fibrillation: Secondary | ICD-10-CM | POA: Diagnosis not present

## 2024-05-07 DIAGNOSIS — I10 Essential (primary) hypertension: Secondary | ICD-10-CM | POA: Diagnosis not present

## 2024-05-27 ENCOUNTER — Encounter (HOSPITAL_COMMUNITY): Payer: Self-pay | Admitting: *Deleted

## 2024-05-27 ENCOUNTER — Emergency Department (HOSPITAL_COMMUNITY)

## 2024-05-27 ENCOUNTER — Inpatient Hospital Stay (HOSPITAL_COMMUNITY)
Admission: EM | Admit: 2024-05-27 | Discharge: 2024-05-29 | DRG: 308 | Disposition: A | Discharge status: Home / Self Care | Attending: Internal Medicine | Admitting: Internal Medicine

## 2024-05-27 ENCOUNTER — Encounter: Payer: Self-pay | Admitting: Hematology

## 2024-05-27 ENCOUNTER — Other Ambulatory Visit: Payer: Self-pay

## 2024-05-27 DIAGNOSIS — Z823 Family history of stroke: Secondary | ICD-10-CM

## 2024-05-27 DIAGNOSIS — E1165 Type 2 diabetes mellitus with hyperglycemia: Secondary | ICD-10-CM | POA: Diagnosis not present

## 2024-05-27 DIAGNOSIS — I350 Nonrheumatic aortic (valve) stenosis: Secondary | ICD-10-CM | POA: Diagnosis not present

## 2024-05-27 DIAGNOSIS — I4891 Unspecified atrial fibrillation: Secondary | ICD-10-CM | POA: Diagnosis not present

## 2024-05-27 DIAGNOSIS — R918 Other nonspecific abnormal finding of lung field: Secondary | ICD-10-CM | POA: Diagnosis not present

## 2024-05-27 DIAGNOSIS — C250 Malignant neoplasm of head of pancreas: Secondary | ICD-10-CM | POA: Diagnosis present

## 2024-05-27 DIAGNOSIS — I509 Heart failure, unspecified: Secondary | ICD-10-CM | POA: Diagnosis not present

## 2024-05-27 DIAGNOSIS — R0602 Shortness of breath: Secondary | ICD-10-CM | POA: Diagnosis not present

## 2024-05-27 DIAGNOSIS — Z79899 Other long term (current) drug therapy: Secondary | ICD-10-CM | POA: Diagnosis not present

## 2024-05-27 DIAGNOSIS — Z794 Long term (current) use of insulin: Secondary | ICD-10-CM | POA: Diagnosis not present

## 2024-05-27 DIAGNOSIS — Z7984 Long term (current) use of oral hypoglycemic drugs: Secondary | ICD-10-CM

## 2024-05-27 DIAGNOSIS — D649 Anemia, unspecified: Secondary | ICD-10-CM | POA: Diagnosis present

## 2024-05-27 DIAGNOSIS — Z96651 Presence of right artificial knee joint: Secondary | ICD-10-CM | POA: Diagnosis not present

## 2024-05-27 DIAGNOSIS — E785 Hyperlipidemia, unspecified: Secondary | ICD-10-CM | POA: Diagnosis present

## 2024-05-27 DIAGNOSIS — Z66 Do not resuscitate: Secondary | ICD-10-CM | POA: Diagnosis not present

## 2024-05-27 DIAGNOSIS — I5033 Acute on chronic diastolic (congestive) heart failure: Secondary | ICD-10-CM | POA: Diagnosis not present

## 2024-05-27 DIAGNOSIS — E119 Type 2 diabetes mellitus without complications: Secondary | ICD-10-CM

## 2024-05-27 DIAGNOSIS — I480 Paroxysmal atrial fibrillation: Secondary | ICD-10-CM | POA: Diagnosis not present

## 2024-05-27 DIAGNOSIS — R0902 Hypoxemia: Principal | ICD-10-CM | POA: Diagnosis present

## 2024-05-27 DIAGNOSIS — Z8507 Personal history of malignant neoplasm of pancreas: Secondary | ICD-10-CM

## 2024-05-27 DIAGNOSIS — I700 Atherosclerosis of aorta: Secondary | ICD-10-CM | POA: Diagnosis not present

## 2024-05-27 DIAGNOSIS — Z953 Presence of xenogenic heart valve: Secondary | ICD-10-CM | POA: Diagnosis not present

## 2024-05-27 DIAGNOSIS — R911 Solitary pulmonary nodule: Secondary | ICD-10-CM | POA: Diagnosis not present

## 2024-05-27 DIAGNOSIS — T82857A Stenosis of cardiac prosthetic devices, implants and grafts, initial encounter: Secondary | ICD-10-CM | POA: Diagnosis not present

## 2024-05-27 DIAGNOSIS — Z8673 Personal history of transient ischemic attack (TIA), and cerebral infarction without residual deficits: Secondary | ICD-10-CM

## 2024-05-27 DIAGNOSIS — Z8249 Family history of ischemic heart disease and other diseases of the circulatory system: Secondary | ICD-10-CM

## 2024-05-27 DIAGNOSIS — I081 Rheumatic disorders of both mitral and tricuspid valves: Secondary | ICD-10-CM | POA: Diagnosis present

## 2024-05-27 DIAGNOSIS — Z7901 Long term (current) use of anticoagulants: Secondary | ICD-10-CM

## 2024-05-27 DIAGNOSIS — K219 Gastro-esophageal reflux disease without esophagitis: Secondary | ICD-10-CM | POA: Diagnosis not present

## 2024-05-27 DIAGNOSIS — E876 Hypokalemia: Secondary | ICD-10-CM | POA: Diagnosis not present

## 2024-05-27 DIAGNOSIS — Z952 Presence of prosthetic heart valve: Secondary | ICD-10-CM

## 2024-05-27 DIAGNOSIS — E1100 Type 2 diabetes mellitus with hyperosmolarity without nonketotic hyperglycemic-hyperosmolar coma (NKHHC): Secondary | ICD-10-CM

## 2024-05-27 DIAGNOSIS — I110 Hypertensive heart disease with heart failure: Secondary | ICD-10-CM | POA: Diagnosis not present

## 2024-05-27 DIAGNOSIS — E039 Hypothyroidism, unspecified: Secondary | ICD-10-CM | POA: Diagnosis not present

## 2024-05-27 DIAGNOSIS — C259 Malignant neoplasm of pancreas, unspecified: Secondary | ICD-10-CM | POA: Diagnosis not present

## 2024-05-27 DIAGNOSIS — Z888 Allergy status to other drugs, medicaments and biological substances status: Secondary | ICD-10-CM

## 2024-05-27 DIAGNOSIS — Y831 Surgical operation with implant of artificial internal device as the cause of abnormal reaction of the patient, or of later complication, without mention of misadventure at the time of the procedure: Secondary | ICD-10-CM | POA: Diagnosis present

## 2024-05-27 LAB — CBC
HCT: 33.1 % — ABNORMAL LOW (ref 36.0–46.0)
Hemoglobin: 10 g/dL — ABNORMAL LOW (ref 12.0–15.0)
MCH: 22.8 pg — ABNORMAL LOW (ref 26.0–34.0)
MCHC: 30.2 g/dL (ref 30.0–36.0)
MCV: 75.6 fL — ABNORMAL LOW (ref 80.0–100.0)
Platelets: 343 K/uL (ref 150–400)
RBC: 4.38 MIL/uL (ref 3.87–5.11)
RDW: 18.7 % — ABNORMAL HIGH (ref 11.5–15.5)
WBC: 9.7 K/uL (ref 4.0–10.5)
nRBC: 0 % (ref 0.0–0.2)

## 2024-05-27 LAB — BASIC METABOLIC PANEL WITH GFR
Anion gap: 10 (ref 5–15)
BUN: 8 mg/dL (ref 8–23)
CO2: 26 mmol/L (ref 22–32)
Calcium: 9.6 mg/dL (ref 8.9–10.3)
Chloride: 99 mmol/L (ref 98–111)
Creatinine, Ser: 0.54 mg/dL (ref 0.44–1.00)
GFR, Estimated: >60 mL/min (ref >60)
Glucose, Bld: 123 mg/dL — ABNORMAL HIGH (ref 70–99)
Potassium: 3.6 mmol/L (ref 3.5–5.1)
Sodium: 135 mmol/L (ref 135–145)

## 2024-05-27 LAB — BRAIN NATRIURETIC PEPTIDE: B Natriuretic Peptide: 2160.4 pg/mL — ABNORMAL HIGH (ref 0.0–100.0)

## 2024-05-27 LAB — TROPONIN I (HIGH SENSITIVITY)
Troponin I (High Sensitivity): 10 ng/L (ref <18)
Troponin I (High Sensitivity): 9 ng/L (ref <18)

## 2024-05-27 MED ORDER — METOPROLOL TARTRATE 25 MG PO TABS
25.0000 mg | ORAL_TABLET | Freq: Two times a day (BID) | ORAL | Status: DC
  Administered 2024-05-27 – 2024-05-29 (×4): 25 mg via ORAL
  Filled 2024-05-27 (×4): qty 1

## 2024-05-27 MED ORDER — ACETAMINOPHEN 500 MG PO TABS: 1000.0000 mg | ORAL_TABLET | Freq: Three times a day (TID) | ORAL | Status: DC | PRN

## 2024-05-27 MED ORDER — AMIODARONE HCL IN DEXTROSE 360-4.14 MG/200ML-% IV SOLN
60.0000 mg/h | INTRAVENOUS | Status: DC
  Filled 2024-05-27: qty 200

## 2024-05-27 MED ORDER — FUROSEMIDE 10 MG/ML IJ SOLN
40.0000 mg | Freq: Once | INTRAMUSCULAR | Status: AC
  Administered 2024-05-27: 40 mg via INTRAVENOUS
  Filled 2024-05-27: qty 4

## 2024-05-27 MED ORDER — INSULIN ASPART 100 UNIT/ML IJ SOLN
0.0000 [IU] | Freq: Three times a day (TID) | INTRAMUSCULAR | Status: DC
  Administered 2024-05-28: 2 [IU] via SUBCUTANEOUS
  Administered 2024-05-28 – 2024-05-29 (×4): 1 [IU] via SUBCUTANEOUS

## 2024-05-27 MED ORDER — PANTOPRAZOLE SODIUM 40 MG PO TBEC
40.0000 mg | DELAYED_RELEASE_TABLET | Freq: Every day | ORAL | Status: DC
  Administered 2024-05-28 – 2024-05-29 (×2): 40 mg via ORAL
  Filled 2024-05-27 (×2): qty 1

## 2024-05-27 MED ORDER — PANCRELIPASE (LIP-PROT-AMYL) 36000-114000 UNITS PO CPEP
36000.0000 [IU] | ORAL_CAPSULE | Freq: Three times a day (TID) | ORAL | Status: DC
  Administered 2024-05-28 – 2024-05-29 (×5): 36000 [IU] via ORAL
  Filled 2024-05-27 (×7): qty 1

## 2024-05-27 MED ORDER — AMIODARONE HCL IN DEXTROSE 360-4.14 MG/200ML-% IV SOLN: 30.0000 mg/h | INTRAVENOUS | Status: DC

## 2024-05-27 MED ORDER — SIMETHICONE 80 MG PO CHEW
80.0000 mg | CHEWABLE_TABLET | Freq: Four times a day (QID) | ORAL | Status: DC | PRN
  Filled 2024-05-27: qty 1

## 2024-05-27 MED ORDER — IOHEXOL 350 MG/ML SOLN
75.0000 mL | Freq: Once | INTRAVENOUS | Status: AC | PRN
  Administered 2024-05-27: 75 mL via INTRAVENOUS

## 2024-05-27 MED ORDER — RIVAROXABAN 20 MG PO TABS
20.0000 mg | ORAL_TABLET | Freq: Every day | ORAL | Status: DC
  Administered 2024-05-28 – 2024-05-29 (×2): 20 mg via ORAL
  Filled 2024-05-27 (×2): qty 1

## 2024-05-27 MED ORDER — AMLODIPINE BESYLATE 5 MG PO TABS: 5.0000 mg | ORAL_TABLET | Freq: Every day | ORAL | Status: DC

## 2024-05-27 MED ORDER — TRAMADOL HCL 50 MG PO TABS: 25.0000 mg | ORAL_TABLET | Freq: Four times a day (QID) | ORAL | Status: DC | PRN

## 2024-05-27 NOTE — ED Provider Triage Note (Signed)
 Emergency Medicine Provider Triage Evaluation Note  MANE CONSOLO , a 83 y.o. female  was evaluated in triage.  Pt complains of shortness of breath that started today.  She states that the last time this happened she had heart failure.  History of pancreatic cancer not on any active treatment.  Review of Systems  Positive:  Negative:   Physical Exam  BP (!) 146/92 (BP Location: Left Arm)   Pulse (!) 126   Temp 98.2 F (36.8 C)   Resp 18   Ht 5' 3 (1.6 m)   Wt 64.4 kg   SpO2 98%   BMI 25.15 kg/m  Gen:   Awake, no distress   Resp:  Normal effort  MSK:   Moves extremities without difficulty  Other:  Tachycardic  Medical Decision Making  Medically screening exam initiated at 6:26 PM.  Appropriate orders placed.  Tejal P Banes was informed that the remainder of the evaluation will be completed by another provider, this initial triage assessment does not replace that evaluation, and the importance of remaining in the ED until their evaluation is complete.  This patient is acutely ill and not safe in the waiting room.   Theotis Peers [place], [place] 05/27/24 1827

## 2024-05-27 NOTE — H&P (Signed)
 History and Physical    Janet Williamson FMW:[id number] DOB: [birth year] DOA: 05/27/2024  Patient coming from: Home.  Chief Complaint: Shortness of breath.  HPI: Janet Williamson is a 83 y.o. female with history of pancreatic cancer, chronic HFpEF, status post TAVR, atrial fibrillation, history of CVA, diabetes mellitus type 2 presents to the ER with complaints of shortness of breath.  Patient states she has been compliant with her Lasix  20 mg daily.  Over the last 24 hours she has been getting more short of breath and last night was not able to sleep flat because of the shortness of breath.  Denies chest pain or productive cough.  Has had some runs of palpitations.  ED Course: In the ER CT angiogram of chest shows features concerning for CHF with mild bilateral pleural effusion.  While being in the ER patient had brief runs of A-fib with RVR during which patient's symptoms were more pronounced.  At one point amiodarone  was about to be started but since patient converted back to sinus rhythm it was held.  Cardiology was consulted.  Patient was given IV Lasix  admitted for further management.  Labs show BNP of 2100 and troponin of 10.  Potassium was 3.6 creatinine 0.5.  Hemoglobin 10.  Review of Systems: As per HPI, rest all negative.   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 Not seen on most recent TEE 2019.   Cardiomyopathy in other disease    Cataracts, bilateral    Diabetes mellitus    Type 2   Generalized osteoarthritis    GERD (gastroesophageal reflux disease)    HLD (hyperlipidemia)    Hypertension    Hypothyroidism    LBBB (left bundle branch block)    PAF (paroxysmal atrial fibrillation) (HCC)    a. s/p DCCV 06/2017, on Xarelto    Phlebitis    S/P TAVR (transcatheter aortic valve replacement) 04/15/2018   Edwards Sapien 3 THV (size 23 mm, model # 9600TFX, serial # [id number])  via the TF approach   Severe aortic stenosis    a. s/p TAVR 04/2018.   Stroke [facility]) 2008    Past Surgical History:  Procedure Laterality Date   ABDOMINAL HYSTERECTOMY     BILIARY BRUSHING  10/16/2023   Procedure: BILIARY BRUSHING;  Surgeon: Burnette Fallow, MD;  Location: [facility];  Service: Gastroenterology;;   BILIARY STENT PLACEMENT N/A 10/16/2023   Procedure: BILIARY STENT PLACEMENT;  Surgeon: Burnette Fallow, MD;  Location: [facility];  Service: Gastroenterology;  Laterality: N/A;   BIOPSY  05/19/2019   Procedure: BIOPSY;  Surgeon: Donnald Charleston, MD;  Location: [facility] ENDOSCOPY;  Service: Endoscopy;;   BREAST BIOPSY Left    years ago at a doctor's office   COLONOSCOPY     COLONOSCOPY WITH PROPOFOL  N/A 10/02/2018   Procedure: COLONOSCOPY WITH PROPOFOL ;  Surgeon: Burnette Fallow, MD;  Location: [facility] ENDOSCOPY;  Service: Endoscopy;  Laterality: N/A;   ENTEROSCOPY N/A 05/19/2019   Procedure: ENTEROSCOPY;  Surgeon: Donnald Charleston, MD;  Location: [facility] ENDOSCOPY;  Service: Endoscopy;  Laterality: N/A;   ERCP N/A 10/16/2023   Procedure: ENDOSCOPIC RETROGRADE CHOLANGIOPANCREATOGRAPHY (ERCP);  Surgeon: Burnette Fallow, MD;  Location: [facility];  Service: Gastroenterology;  Laterality: N/A;   ESOPHAGOGASTRODUODENOSCOPY (EGD) WITH PROPOFOL  N/A 10/02/2018   Procedure: ESOPHAGOGASTRODUODENOSCOPY (EGD) WITH PROPOFOL ;  Surgeon: Burnette Fallow, MD;  Location: [facility] ENDOSCOPY;  Service: Endoscopy;  Laterality: N/A;  ESOPHAGOGASTRODUODENOSCOPY (EGD) WITH PROPOFOL  N/A 04/23/2019   Procedure: ESOPHAGOGASTRODUODENOSCOPY (EGD) WITH PROPOFOL ;  Surgeon: Lennard Lesta FALCON, MD;  Location: [facility] ENDOSCOPY;  Service: Endoscopy;  Laterality: N/A;   ESOPHAGOGASTRODUODENOSCOPY (EGD) WITH PROPOFOL  N/A 10/16/2023   Procedure: ESOPHAGOGASTRODUODENOSCOPY (EGD) WITH PROPOFOL ;  Surgeon: Burnette Fallow, MD;  Location: [facility];  Service: Gastroenterology;  Laterality: N/A;   EUS N/A 10/16/2023   Procedure: FULL UPPER ENDOSCOPIC  ULTRASOUND (EUS) RADIAL;  Surgeon: Burnette Fallow, MD;  Location: [facility];  Service: Gastroenterology;  Laterality: N/A;   EYE SURGERY Bilateral    cataract removal   FINE NEEDLE ASPIRATION N/A 10/16/2023   Procedure: FINE NEEDLE ASPIRATION (FNA) LINEAR;  Surgeon: Burnette Fallow, MD;  Location: [facility];  Service: Gastroenterology;  Laterality: N/A;   GIVENS CAPSULE STUDY N/A 10/02/2018   Procedure: GIVENS CAPSULE STUDY;  Surgeon: Burnette Fallow, MD;  Location: [facility] ENDOSCOPY;  Service: Endoscopy;  Laterality: N/A;   IR IMAGING GUIDED PORT INSERTION  01/10/2024   RIGHT/LEFT HEART CATH AND CORONARY ANGIOGRAPHY N/A 03/06/2018   Procedure: RIGHT/LEFT HEART CATH AND CORONARY ANGIOGRAPHY;  Surgeon: Claudene Victory ORN, MD;  Location: [facility];  Service: Cardiovascular;  Laterality: N/A;   SMALL BOWEL ENTEROSCOPY  05/19/2019   SPHINCTEROTOMY  10/16/2023   Procedure: SPHINCTEROTOMY;  Surgeon: Burnette Fallow, MD;  Location: [facility];  Service: Gastroenterology;;   TEE WITHOUT CARDIOVERSION N/A 04/01/2018   Procedure: TRANSESOPHAGEAL ECHOCARDIOGRAM (TEE);  Surgeon: Jeffrie Oneil BROCKS, MD;  Location: [facility] ENDOSCOPY;  Service: Cardiovascular;  Laterality: N/A;   TEE WITHOUT CARDIOVERSION N/A 04/15/2018   Procedure: TRANSESOPHAGEAL ECHOCARDIOGRAM (TEE);  Surgeon: Wonda Sharper, MD;  Location: [facility] OR;  Service: Open Heart Surgery;  Laterality: N/A;   TOTAL KNEE ARTHROPLASTY Right 04/14/2013   Dr Addie   TOTAL KNEE ARTHROPLASTY Right 04/14/2013   Procedure: TOTAL KNEE ARTHROPLASTY;  Surgeon: Cordella Glendia Addie, MD;  Location: [facility] OR;  Service: Orthopedics;  Laterality: Right;   TRANSCATHETER AORTIC VALVE REPLACEMENT, TRANSFEMORAL  04/15/2018   TRANSCATHETER AORTIC VALVE REPLACEMENT, TRANSFEMORAL Bilateral 04/15/2018   Procedure: TRANSCATHETER AORTIC VALVE REPLACEMENT, TRANSFEMORAL;  Surgeon: Wonda Sharper, MD;  Location: [facility] OR;  Service: Open Heart Surgery;  Laterality: Bilateral;     reports that she  has never smoked. She has never used smokeless tobacco. She reports that she does not drink alcohol and does not use drugs.  Allergies  Allergen Reactions   Ace Inhibitors Swelling and Other (See Comments)    Angioedema    Keflex [Cephalexin] Swelling and Other (See Comments)    Lips became swollen   Rifadin [Rifampin] Swelling and Other (See Comments)    Lips became swollen    Family History  Problem Relation Age of Onset   Stroke Mother    Hypertension Mother    Hypertension Father    Heart attack Neg Hx     Prior to Admission medications   Medication Sig Start Date End Date Taking? Authorizing Provider  acetaminophen  (TYLENOL ) 500 MG tablet Take 1,000 mg by mouth every 8 (eight) hours as needed for mild pain (pain score 1-3) (for headaches).   Yes [provider]  amLODipine  (NORVASC ) 5 MG tablet Take 5 mg by mouth daily.   Yes [provider]  baclofen  (LIORESAL ) 10 MG tablet Take 1 tablet (10 mg total) by mouth 3 (three) times daily. Patient taking differently: Take 10 mg by mouth as needed for muscle spasms. 03/11/24  Yes Pickenpack-Cousar, Fannie SAILOR, NP  Cholecalciferol  (VITAMIN D3) 5000 units CAPS Take 5,000 Units  by mouth daily.   Yes [provider]  CREON  36000-114000 units CPEP capsule TAKE TWO (2) CAPSULES BY MOUTH THREE TIMES A DAY WITH MEALS TAKE 1 CAPSULE BY MOUTH AS NEEDED WITH SNACK. MAX 10/DAY *NEW PRESCRIPTION REQUEST* 02/28/24  Yes Boscia, Heather E, NP  furosemide  (LASIX ) 20 MG tablet Take 1 tablet (20 mg total) by mouth daily. Can take an additional tablet as needed for weight gain 03/06/24  Yes Wyn Jackee VEAR Mickey., NP  lidocaine -prilocaine  (EMLA ) cream APPLY A QUARTER SIZE AMOUNT OF CREAM TO PORT - A- CATH 45 MINUTES TO 1 HOUR PRIOR TO PORT-A-CATH BEING ACCESS. *NEW PRESCRIPTION REQUEST* 02/28/24  Yes Boscia, Powell BRAVO, NP  loperamide  (IMODIUM ) 2 MG capsule Take 1 capsule (2 mg total) by mouth 4 (four) times daily as needed for diarrhea or  loose stools. 04/05/24  Yes Charlyn Sora, MD  metFORMIN  (GLUCOPHAGE -XR) 750 MG 24 hr tablet TAKE 2 TABLETS ONE TIME DAILY WITH BREAKFAST 11/27/23  Yes Thapa, [place], MD  metoprolol  tartrate (LOPRESSOR ) 25 MG tablet Take 1 tablet (25 mg total) by mouth 2 (two) times daily. 02/18/24  Yes Patsy Lenis, MD  pantoprazole  (PROTONIX ) 20 MG tablet TAKE 1 TABLET BY MOUTH DAILY *NEW PRESCRIPTION REQUEST* 02/28/24  Yes Boscia, Heather E, NP  Polyethyl Glycol-Propyl Glycol (SYSTANE OP) Place 1 drop into both eyes 2 (two) times daily.    Yes [provider]  rivaroxaban  (XARELTO ) 20 MG TABS tablet Take 1 tablet (20 mg total) by mouth daily with supper. 02/09/24  Yes Patt Lenis Macho, MD  traMADol  (ULTRAM ) 50 MG tablet Take 0.5-1 tablets (25-50 mg total) by mouth every 6 (six) hours as needed. 03/11/24  Yes Pickenpack-Cousar, Fannie SAILOR, NP    Physical Exam: Constitutional: Moderately built and nourished. Vitals:   05/27/24 2100 05/27/24 2115 05/27/24 2215 05/27/24 2226  BP: (!) 174/73 (!) 171/74 (!) 143/59   Pulse: 92 88 80   Resp: (!) 24 (!) 27 19   Temp:    98.9 F (37.2 C)  TempSrc:    Oral  SpO2: 100% 97% 100%   Weight:      Height:       Eyes: Anicteric no pallor. ENMT: No discharge from the ears eyes nose or mouth. Neck: No mass felt.  No neck rigidity. Respiratory: No rhonchi or crepitations. Cardiovascular: S1-S2 heard. Abdomen: Soft nontender bowel sound present. Musculoskeletal: No edema. Skin: No rash. Neurologic: Alert awake oriented to time place and person.  Moves all extremities. Psychiatric: Appears normal.  Normal affect.   Labs on Admission: I have personally reviewed following labs and imaging studies  CBC: Recent Labs  Lab 05/27/24 1821  WBC 9.7  HGB 10.0*  HCT 33.1*  MCV 75.6*  PLT 343   Basic Metabolic Panel: Recent Labs  Lab 05/27/24 1821  NA 135  K 3.6  CL 99  CO2 26  GLUCOSE 123*  BUN 8  CREATININE 0.54  CALCIUM  9.6   GFR: Estimated  Creatinine Clearance: 48.1 mL/min (by C-G formula based on SCr of 0.54 mg/dL). Liver Function Tests: No results for input(s): AST, ALT, ALKPHOS, BILITOT, PROT, ALBUMIN in the last 168 hours. No results for input(s): LIPASE, AMYLASE in the last 168 hours. No results for input(s): AMMONIA in the last 168 hours. Coagulation Profile: No results for input(s): INR, PROTIME in the last 168 hours. Cardiac Enzymes: No results for input(s): CKTOTAL, CKMB, CKMBINDEX, TROPONINI in the last 168 hours. BNP (last 3 results) No results for input(s): PROBNP in  the last 8760 hours. HbA1C: No results for input(s): HGBA1C in the last 72 hours. CBG: No results for input(s): GLUCAP in the last 168 hours. Lipid Profile: No results for input(s): CHOL, HDL, LDLCALC, TRIG, CHOLHDL, LDLDIRECT in the last 72 hours. Thyroid  Function Tests: No results for input(s): TSH, T4TOTAL, FREET4, T3FREE, THYROIDAB in the last 72 hours. Anemia Panel: No results for input(s): VITAMINB12, FOLATE, FERRITIN, TIBC, IRON, RETICCTPCT in the last 72 hours. Urine analysis:    Component Value Date/Time   COLORURINE YELLOW 10/30/2018 0936   APPEARANCEUR Sl Cloudy (A) 10/30/2018 0936   LABSPEC 1.020 10/30/2018 0936   PHURINE 5.5 10/30/2018 0936   GLUCOSEU NEGATIVE 10/30/2018 0936   HGBUR NEGATIVE 10/30/2018 0936   BILIRUBINUR NEGATIVE 10/30/2018 0936   KETONESUR NEGATIVE 10/30/2018 0936   PROTEINUR NEGATIVE 09/30/2018 0013   UROBILINOGEN 0.2 10/30/2018 0936   NITRITE NEGATIVE 10/30/2018 0936   LEUKOCYTESUR NEGATIVE 10/30/2018 0936   Sepsis Labs: @LABRCNTIP (procalcitonin:4,lacticidven:4) )No results found for this or any previous visit (from the past 240 hours).   Radiological Exams on Admission: CT Angio Chest PE W and/or Wo Contrast Result Date: 05/27/2024 CLINICAL DATA:  Concern for pulmonary embolism.  Pancreatic cancer. EXAM: CT ANGIOGRAPHY CHEST WITH  CONTRAST TECHNIQUE: Multidetector CT imaging of the chest was performed using the standard protocol during bolus administration of intravenous contrast. Multiplanar CT image reconstructions and MIPs were obtained to evaluate the vascular anatomy. RADIATION DOSE REDUCTION: This exam was performed according to the departmental dose-optimization program which includes automated exposure control, adjustment of the mA and/or kV according to patient size and/or use of iterative reconstruction technique. CONTRAST:  75mL OMNIPAQUE  IOHEXOL  350 MG/ML SOLN COMPARISON:  Chest radiograph dated 05/27/2024. FINDINGS: Cardiovascular: There is mild cardiomegaly. No pericardial effusion. There is calcification of the mitral annulus. Aortic valve repair. There is mild atherosclerotic calcification of the thoracic aorta. No aneurysmal dilatation. No pulmonary artery embolus identified. Mediastinum/Nodes: There is soft tissue fullness of the hilum and mediastinum. Prevascular space lymph nodes measure 14 mm short axis. The esophagus is grossly unremarkable. Heterogeneously enlarged thyroid  gland in keeping with multinodular goiter. This has been evaluated on previous imaging. (ref: J Am Coll Radiol. 2015 Feb;12(2): 143-50).No mediastinal fluid collection. Right-sided Port-A-Cath with tip in the right atrium. Lungs/Pleura: Small bilateral pleural effusions. There is diffuse interstitial and interlobular septal prominence consistent with edema. Scattered pulmonary nodularity may represent edema or superimposed pneumonia. There is a 1 cm nodule in the right lower lobe. There is no pneumothorax. The central airways are patent. Upper Abdomen: Pneumobilia. Musculoskeletal: Osteopenia with degenerative changes spine. No acute osseous pathology. Review of the MIP images confirms the above findings. IMPRESSION: 1. No CT evidence of pulmonary artery embolus. 2. Cardiomegaly with findings of CHF and small bilateral pleural effusions. 3. Scattered  pulmonary nodularity may represent edema or superimposed pneumonia. 4. A 1 cm nodule in the right lower lobe. 5.  Aortic Atherosclerosis (ICD10-I70.0). Electronically Signed   By: Vanetta Chou M.D.   On: 05/27/2024 20:33   DG Chest 2 View Result Date: 05/27/2024 CLINICAL DATA:  Shortness of breath EXAM: CHEST - 2 VIEW COMPARISON:  02/15/2024 FINDINGS: Right Port-A-Cath remains in place, unchanged. Heart and mediastinal contours are within normal limits. Aortic atherosclerosis. Prior aortic valve repair. Bilateral pulmonary infiltrates noted, right greater than left. No effusions. No acute bony abnormality. IMPRESSION: Bilateral pulmonary infiltrates, right greater than left. This could reflect edema or infection. Electronically Signed   By: Franky Crease M.D.   On: 05/27/2024  19:15    EKG: Independently reviewed.  A-fib with RVR.  Assessment/Plan Principal Problem:   Acute on chronic heart failure with preserved ejection fraction (HFpEF) (HCC) Active Problems:   S/P TAVR (transcatheter aortic valve replacement)   Malignant neoplasm of head of pancreas (HCC)   DMII (diabetes mellitus, type 2) (HCC)   Anemia   History of stroke   Severe aortic stenosis   Acute CHF (congestive heart failure) (HCC)   Atrial fibrillation with RVR (HCC)    Acute on chronic HFpEF status post TAVR last EF measured was 65 to 70% on June 2025.  Appreciate cardiology consult recommended 40 mg IV Lasix  every 12 hourly for now.  Closely follow intake output metabolic panel Daily weights.  Patient does have a history of TAVR and significant valvular disease.  If patient develops fever will need blood cultures. A-fib with RVR likely precipitated by CHF.  Cardiology recommended continuing metoprolol  for now.  If patient goes back into A-fib with RVR to start amiodarone  infusion.  Check TSH.  Continue Xarelto . Hypertension uncontrolled on amlodipine  and metoprolol .  Follow blood pressure trends.  Patient is also on IV  Lasix .  May need to adjust blood pressure medication if blood pressure remains elevated. Diabetes mellitus type 2 on metformin .  Last hemoglobin A1c was 7.1   4 months ago.  On sliding scale coverage. History of pancreatic cancer being followed by oncologist supportive care at this time.  Continue Creon . Anemia follow CBC.  Since patient has CHF will need close monitoring IV diuresis and more than 2 midnight stay.   DVT prophylaxis: Xarelto . Code Status: DNR. Family Communication: Daughter is at the bedside. Disposition Plan: Cardiac telemetry. Consults called: Cardiology. Admission status: Observation.

## 2024-05-27 NOTE — ED Notes (Signed)
 Pt started wheezing, and O2 levels dropped to 76. Approved with Nurse and put her on 4L of O2.

## 2024-05-27 NOTE — ED Provider Notes (Signed)
 [facility] 4E CV SURGICAL PROGRESSIVE CARE Provider Note  CSN: [id number] Arrival date & time: 05/27/24 1757  Chief Complaint(s) Headache, Shortness of Breath, and Tachycardia  HPI Janet Williamson is a 83 y.o. female with PMH metastatic pancreatic cancer on active chemo, paroxysmal A-fib, CHF, severe aortic stenosis status post TAVR with persistent aortic stenosis of the prosthetic valve, recent hospital admission for endocarditis, previous CVA who presents emerged part for evaluation of shortness of breath and headache.  States that symptoms have worsened over the last 24 hours.  She is having difficulty lying flat and feels like she has too much fluid on board.  Endorses compliance with her Lasix  regimen.  Denies chest pain, abdominal pain, nausea, vomiting or other systemic symptoms.   Past Medical History 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 Not seen on most recent TEE 2019.   Cardiomyopathy in other disease    Cataracts, bilateral    Diabetes mellitus    Type 2   Generalized osteoarthritis    GERD (gastroesophageal reflux disease)    HLD (hyperlipidemia)    Hypertension    Hypothyroidism    LBBB (left bundle branch block)    PAF (paroxysmal atrial fibrillation) (HCC)    a. s/p DCCV 06/2017, on Xarelto    Phlebitis    S/P TAVR (transcatheter aortic valve replacement) 04/15/2018   Edwards Sapien 3 THV (size 23 mm, model # 9600TFX, serial # [id number]) via the TF approach   Severe aortic stenosis    a. s/p TAVR 04/2018.   Stroke [facility]) 2008   Patient Active Problem List   Diagnosis Date Noted   Acute CHF (congestive heart failure) (HCC) 05/27/2024   Atrial fibrillation with RVR (HCC) 05/27/2024   Acute on chronic heart failure with preserved ejection fraction (HFpEF) (HCC) 02/15/2024   Stenosis of prosthetic aortic valve 02/15/2024   Port-A-Cath in place 01/15/2024   Genetic  testing 12/04/2023   Malignant neoplasm of head of pancreas (HCC) 10/24/2023   History of GI bleed 04/21/2019   Symptomatic anemia 09/30/2018   S/P TAVR (transcatheter aortic valve replacement) 04/15/2018   Severe aortic stenosis    PAF (paroxysmal atrial fibrillation) (HCC) 03/04/2018   History of stroke 11/03/2014   Hypertension    DMII (diabetes mellitus, type 2) (HCC) 05/06/2013   HTN (hypertension) 05/06/2013   Anemia 05/06/2013   Home Medication(s) Prior to Admission medications   Medication Sig Start Date End Date Taking? Authorizing Provider  acetaminophen  (TYLENOL ) 500 MG tablet Take 1,000 mg by mouth every 8 (eight) hours as needed for mild pain (pain score 1-3) (for headaches).   Yes [provider]  amLODipine  (NORVASC ) 5 MG tablet Take 5 mg by mouth daily.   Yes [provider]  baclofen  (LIORESAL ) 10 MG tablet Take 1 tablet (10 mg total) by mouth 3 (three) times daily. Patient taking differently: Take 10 mg by mouth as needed for muscle spasms. 03/11/24  Yes Pickenpack-Cousar, Fannie SAILOR, NP  Cholecalciferol  (VITAMIN D3) 5000 units CAPS Take 5,000 Units by mouth daily.   Yes [provider]  CREON  36000-114000 units CPEP capsule TAKE TWO (2) CAPSULES BY MOUTH THREE TIMES A DAY WITH MEALS TAKE 1 CAPSULE BY MOUTH AS NEEDED WITH SNACK. MAX 10/DAY *NEW PRESCRIPTION REQUEST* 02/28/24  Yes Boscia, Heather E, NP  cyanocobalamin  (VITAMIN B12) 500 MCG tablet Take 1,000 mcg by mouth in  the morning. Take two tablets by mouth every morning.   Yes [provider]  furosemide  (LASIX ) 20 MG tablet Take 1 tablet (20 mg total) by mouth daily. Can take an additional tablet as needed for weight gain 03/06/24  Yes Wyn Jackee VEAR Mickey., NP  lidocaine -prilocaine  (EMLA ) cream APPLY A QUARTER SIZE AMOUNT OF CREAM TO PORT - A- CATH 45 MINUTES TO 1 HOUR PRIOR TO PORT-A-CATH BEING ACCESS. *NEW PRESCRIPTION REQUEST* 02/28/24  Yes Boscia, Heather E, NP  loperamide  (IMODIUM ) 2 MG  capsule Take 1 capsule (2 mg total) by mouth 4 (four) times daily as needed for diarrhea or loose stools. 04/05/24  Yes Charlyn Sora, MD  metFORMIN  (GLUCOPHAGE -XR) 750 MG 24 hr tablet TAKE 2 TABLETS ONE TIME DAILY WITH BREAKFAST 11/27/23  Yes Thapa, [place], MD  metoprolol  tartrate (LOPRESSOR ) 25 MG tablet Take 1 tablet (25 mg total) by mouth 2 (two) times daily. 02/18/24  Yes Patsy Lenis, MD  pantoprazole  (PROTONIX ) 20 MG tablet TAKE 1 TABLET BY MOUTH DAILY *NEW PRESCRIPTION REQUEST* 02/28/24  Yes Boscia, Heather E, NP  Polyethyl Glycol-Propyl Glycol (SYSTANE OP) Place 1 drop into both eyes 2 (two) times daily.    Yes [provider]  rivaroxaban  (XARELTO ) 20 MG TABS tablet Take 1 tablet (20 mg total) by mouth daily with supper. Patient taking differently: Take 20 mg by mouth daily with breakfast. 02/09/24  Yes Patt Lenis Macho, MD  traMADol  (ULTRAM ) 50 MG tablet Take 0.5-1 tablets (25-50 mg total) by mouth every 6 (six) hours as needed. 03/11/24  Yes Pickenpack-Cousar, Fannie SAILOR, NP                                                                                                                                    Past Surgical History Past Surgical History:  Procedure Laterality Date   ABDOMINAL HYSTERECTOMY     BILIARY BRUSHING  10/16/2023   Procedure: BILIARY BRUSHING;  Surgeon: Burnette Fallow, MD;  Location: [facility];  Service: Gastroenterology;;   BILIARY STENT PLACEMENT N/A 10/16/2023   Procedure: BILIARY STENT PLACEMENT;  Surgeon: Burnette Fallow, MD;  Location: [facility];  Service: Gastroenterology;  Laterality: N/A;   BIOPSY  05/19/2019   Procedure: BIOPSY;  Surgeon: Donnald Charleston, MD;  Location: [facility] ENDOSCOPY;  Service: Endoscopy;;   BREAST BIOPSY Left    years ago at a doctor's office   COLONOSCOPY     COLONOSCOPY WITH PROPOFOL  N/A 10/02/2018   Procedure: COLONOSCOPY WITH PROPOFOL ;  Surgeon: Burnette Fallow, MD;  Location: [facility] ENDOSCOPY;  Service: Endoscopy;  Laterality:  N/A;   ENTEROSCOPY N/A 05/19/2019   Procedure: ENTEROSCOPY;  Surgeon: Donnald Charleston, MD;  Location: [facility] ENDOSCOPY;  Service: Endoscopy;  Laterality: N/A;   ERCP N/A 10/16/2023   Procedure: ENDOSCOPIC RETROGRADE CHOLANGIOPANCREATOGRAPHY (ERCP);  Surgeon: Burnette Fallow, MD;  Location: [facility];  Service: Gastroenterology;  Laterality: N/A;   ESOPHAGOGASTRODUODENOSCOPY (EGD) WITH PROPOFOL  N/A 10/02/2018   Procedure:  ESOPHAGOGASTRODUODENOSCOPY (EGD) WITH PROPOFOL ;  Surgeon: Burnette Fallow, MD;  Location: [facility] ENDOSCOPY;  Service: Endoscopy;  Laterality: N/A;   ESOPHAGOGASTRODUODENOSCOPY (EGD) WITH PROPOFOL  N/A 04/23/2019   Procedure: ESOPHAGOGASTRODUODENOSCOPY (EGD) WITH PROPOFOL ;  Surgeon: Lennard Lesta FALCON, MD;  Location: [facility] ENDOSCOPY;  Service: Endoscopy;  Laterality: N/A;   ESOPHAGOGASTRODUODENOSCOPY (EGD) WITH PROPOFOL  N/A 10/16/2023   Procedure: ESOPHAGOGASTRODUODENOSCOPY (EGD) WITH PROPOFOL ;  Surgeon: Burnette Fallow, MD;  Location: [facility];  Service: Gastroenterology;  Laterality: N/A;   EUS N/A 10/16/2023   Procedure: FULL UPPER ENDOSCOPIC ULTRASOUND (EUS) RADIAL;  Surgeon: Burnette Fallow, MD;  Location: [facility];  Service: Gastroenterology;  Laterality: N/A;   EYE SURGERY Bilateral    cataract removal   FINE NEEDLE ASPIRATION N/A 10/16/2023   Procedure: FINE NEEDLE ASPIRATION (FNA) LINEAR;  Surgeon: Burnette Fallow, MD;  Location: [facility];  Service: Gastroenterology;  Laterality: N/A;   GIVENS CAPSULE STUDY N/A 10/02/2018   Procedure: GIVENS CAPSULE STUDY;  Surgeon: Burnette Fallow, MD;  Location: [facility] ENDOSCOPY;  Service: Endoscopy;  Laterality: N/A;   IR IMAGING GUIDED PORT INSERTION  01/10/2024   RIGHT/LEFT HEART CATH AND CORONARY ANGIOGRAPHY N/A 03/06/2018   Procedure: RIGHT/LEFT HEART CATH AND CORONARY ANGIOGRAPHY;  Surgeon: Claudene Victory ORN, MD;  Location: [facility] INVASIVE CV LAB;  Service: Cardiovascular;  Laterality: N/A;   SMALL BOWEL ENTEROSCOPY  05/19/2019   SPHINCTEROTOMY   10/16/2023   Procedure: SPHINCTEROTOMY;  Surgeon: Burnette Fallow, MD;  Location: [facility];  Service: Gastroenterology;;   TEE WITHOUT CARDIOVERSION N/A 04/01/2018   Procedure: TRANSESOPHAGEAL ECHOCARDIOGRAM (TEE);  Surgeon: Jeffrie Oneil BROCKS, MD;  Location: [facility] ENDOSCOPY;  Service: Cardiovascular;  Laterality: N/A;   TEE WITHOUT CARDIOVERSION N/A 04/15/2018   Procedure: TRANSESOPHAGEAL ECHOCARDIOGRAM (TEE);  Surgeon: Wonda Sharper, MD;  Location: [facility] OR;  Service: Open Heart Surgery;  Laterality: N/A;   TOTAL KNEE ARTHROPLASTY Right 04/14/2013   Dr Addie   TOTAL KNEE ARTHROPLASTY Right 04/14/2013   Procedure: TOTAL KNEE ARTHROPLASTY;  Surgeon: Cordella Glendia Addie, MD;  Location: [facility] OR;  Service: Orthopedics;  Laterality: Right;   TRANSCATHETER AORTIC VALVE REPLACEMENT, TRANSFEMORAL  04/15/2018   TRANSCATHETER AORTIC VALVE REPLACEMENT, TRANSFEMORAL Bilateral 04/15/2018   Procedure: TRANSCATHETER AORTIC VALVE REPLACEMENT, TRANSFEMORAL;  Surgeon: Wonda Sharper, MD;  Location: [facility] OR;  Service: Open Heart Surgery;  Laterality: Bilateral;   Family History Family History  Problem Relation Age of Onset   Stroke Mother    Hypertension Mother    Hypertension Father    Heart attack Neg Hx     Social History Social History   Tobacco Use   Smoking status: Never   Smokeless tobacco: Never  Vaping Use   Vaping status: Never Used  Substance Use Topics   Alcohol use: No   Drug use: No   Allergies Ace inhibitors, Keflex [cephalexin], and Rifadin [rifampin]  Review of Systems Review of Systems  Respiratory:  Positive for shortness of breath.   Cardiovascular:  Positive for palpitations.    Physical Exam Vital Signs  I have reviewed the triage vital signs BP (!) 157/60 (BP Location: Right Arm)   Pulse 77   Temp 98.9 F (37.2 C) (Oral)   Resp 18   Ht 5' 3 (1.6 m)   Wt 64.4 kg   SpO2 100%   BMI 25.15 kg/m   Physical Exam Vitals and nursing note reviewed.  Constitutional:       General: She is not in acute distress.    Appearance: She is well-developed.  HENT:  Head: Normocephalic and atraumatic.  Eyes:     Conjunctiva/sclera: Conjunctivae normal.  Cardiovascular:     Rate and Rhythm: Normal rate and regular rhythm.     Heart sounds: No murmur heard. Pulmonary:     Effort: Pulmonary effort is normal. No respiratory distress.     Breath sounds: Rales present.  Abdominal:     Palpations: Abdomen is soft.     Tenderness: There is no abdominal tenderness.  Musculoskeletal:        General: No swelling.     Cervical back: Neck supple.     Right lower leg: Edema present.     Left lower leg: Edema present.  Skin:    General: Skin is warm and dry.     Capillary Refill: Capillary refill takes less than 2 seconds.  Neurological:     Mental Status: She is alert.  Psychiatric:        Mood and Affect: Mood normal.     ED Results and Treatments Labs (all labs ordered are listed, but only abnormal results are displayed) Labs Reviewed  BASIC METABOLIC PANEL WITH GFR - Abnormal; Notable for the following components:      Result Value   Glucose, Bld 123 (*)    All other components within normal limits  CBC - Abnormal; Notable for the following components:   Hemoglobin 10.0 (*)    HCT 33.1 (*)    MCV 75.6 (*)    MCH 22.8 (*)    RDW 18.7 (*)    All other components within normal limits  BRAIN NATRIURETIC PEPTIDE - Abnormal; Notable for the following components:   B Natriuretic Peptide 2,160.4 (*)    All other components within normal limits  COMPREHENSIVE METABOLIC PANEL WITH GFR  CBC  TSH  TROPONIN I (HIGH SENSITIVITY)  TROPONIN I (HIGH SENSITIVITY)                                                                                                                          Radiology CT Angio Chest PE W and/or Wo Contrast Result Date: 05/27/2024 CLINICAL DATA:  Concern for pulmonary embolism.  Pancreatic cancer. EXAM: CT ANGIOGRAPHY CHEST WITH CONTRAST  TECHNIQUE: Multidetector CT imaging of the chest was performed using the standard protocol during bolus administration of intravenous contrast. Multiplanar CT image reconstructions and MIPs were obtained to evaluate the vascular anatomy. RADIATION DOSE REDUCTION: This exam was performed according to the departmental dose-optimization program which includes automated exposure control, adjustment of the mA and/or kV according to patient size and/or use of iterative reconstruction technique. CONTRAST:  75mL OMNIPAQUE  IOHEXOL  350 MG/ML SOLN COMPARISON:  Chest radiograph dated 05/27/2024. FINDINGS: Cardiovascular: There is mild cardiomegaly. No pericardial effusion. There is calcification of the mitral annulus. Aortic valve repair. There is mild atherosclerotic calcification of the thoracic aorta. No aneurysmal dilatation. No pulmonary artery embolus identified. Mediastinum/Nodes: There is soft tissue fullness of the hilum and mediastinum. Prevascular space lymph nodes measure 14 mm short  axis. The esophagus is grossly unremarkable. Heterogeneously enlarged thyroid  gland in keeping with multinodular goiter. This has been evaluated on previous imaging. (ref: J Am Coll Radiol. 2015 Feb;12(2): 143-50).No mediastinal fluid collection. Right-sided Port-A-Cath with tip in the right atrium. Lungs/Pleura: Small bilateral pleural effusions. There is diffuse interstitial and interlobular septal prominence consistent with edema. Scattered pulmonary nodularity may represent edema or superimposed pneumonia. There is a 1 cm nodule in the right lower lobe. There is no pneumothorax. The central airways are patent. Upper Abdomen: Pneumobilia. Musculoskeletal: Osteopenia with degenerative changes spine. No acute osseous pathology. Review of the MIP images confirms the above findings. IMPRESSION: 1. No CT evidence of pulmonary artery embolus. 2. Cardiomegaly with findings of CHF and small bilateral pleural effusions. 3. Scattered pulmonary  nodularity may represent edema or superimposed pneumonia. 4. A 1 cm nodule in the right lower lobe. 5.  Aortic Atherosclerosis (ICD10-I70.0). Electronically Signed   By: Vanetta Chou M.D.   On: 05/27/2024 20:33   DG Chest 2 View Result Date: 05/27/2024 CLINICAL DATA:  Shortness of breath EXAM: CHEST - 2 VIEW COMPARISON:  02/15/2024 FINDINGS: Right Port-A-Cath remains in place, unchanged. Heart and mediastinal contours are within normal limits. Aortic atherosclerosis. Prior aortic valve repair. Bilateral pulmonary infiltrates noted, right greater than left. No effusions. No acute bony abnormality. IMPRESSION: Bilateral pulmonary infiltrates, right greater than left. This could reflect edema or infection. Electronically Signed   By: Franky Crease M.D.   On: 05/27/2024 19:15    Pertinent labs & imaging results that were available during my care of the patient were reviewed by me and considered in my medical decision making (see MDM for details).  Medications Ordered in ED Medications  acetaminophen  (TYLENOL ) tablet 1,000 mg (has no administration in time range)  traMADol  (ULTRAM ) tablet 25-50 mg (has no administration in time range)  amLODipine  (NORVASC ) tablet 5 mg (has no administration in time range)  metoprolol  tartrate (LOPRESSOR ) tablet 25 mg (25 mg Oral Given 05/27/24 2339)  lipase/protease/amylase (CREON ) capsule 36,000 Units (has no administration in time range)  pantoprazole  (PROTONIX ) EC tablet 40 mg (has no administration in time range)  rivaroxaban  (XARELTO ) tablet 20 mg (has no administration in time range)  insulin  aspart (novoLOG ) injection 0-9 Units (has no administration in time range)  simethicone  (MYLICON) chewable tablet 80 mg (has no administration in time range)  furosemide  (LASIX ) injection 40 mg (40 mg Intravenous Given 05/27/24 2104)  iohexol  (OMNIPAQUE ) 350 MG/ML injection 75 mL (75 mLs Intravenous Contrast Given 05/27/24 2020)                                                                                                                                      Procedures .Critical Care  Performed by: Albertina Dixon, MD Authorized by: Albertina Dixon, MD   Critical care provider statement:    Critical care time (minutes):  30   Critical care was time spent  personally by me on the following activities:  Development of treatment plan with patient or surrogate, discussions with consultants, evaluation of patient's response to treatment, examination of patient, ordering and review of laboratory studies, ordering and review of radiographic studies, ordering and performing treatments and interventions, pulse oximetry, re-evaluation of patient's condition and review of old charts   (including critical care time)  Medical Decision Making / ED Course   This patient presents to the ED for concern of shortness of breath, this involves an extensive number of treatment options, and is a complaint that carries with it a high risk of complications and morbidity.  The differential diagnosis includes Pe, PTX, Pulmonary Edema, COPD/Asthma, ACS, CHF exacerbation, Arrhythmia,  Anemia, Sepsis, Anxiety, Viral UR  MDM: Patient seen emerged from for evaluation of shortness of breath.  Physical exam with rales bilaterally, bilateral lower extremity pitting edema but is otherwise unremarkable.  Patient reportedly presented to triage much more ill-appearing and was found to be in A-fib with RVR with rates in the 140s.  She appears to have spontaneously converted at the time of my evaluation and is feeling much improved.  Laboratory evaluation with a hemoglobin of 10.0, chemistry unremarkable, high-sensitivity troponin is normal but BNP is significantly elevated at 2160.  Chest x-ray with fluid overload.  PE study showing CHF and small bilateral pleural effusions, possible superimposed pneumonia but likely edema.  Shortly after PE study, patient was getting off the bedpan when she had sudden  onset shortness of breath and diaphoresis.  She is 85% on 6 L nasal cannula and was escalated to nonrebreather.  Patient returned to A-fib with RVR with rates in the 140s at that time.  Patient self converted again and was able to be weaned off of the nonrebreather.  I spoke with the cardiologist on-call Dr. Orlando who came to evaluate the patient at bedside.  Will start low-dose beta-blocker and plan for diuresis.  If she were to have an additional episode, would start amnio.  At this time she will require hospital admission for fluid overload and failure of outpatient diuretic regimen.  Patient admitted   Additional history obtained: -Additional history obtained from multiple family numbers -External records from outside source obtained and reviewed including: Chart review including previous notes, labs, imaging, consultation notes   Lab Tests: -I ordered, reviewed, and interpreted labs.   The pertinent results include:   Labs Reviewed  BASIC METABOLIC PANEL WITH GFR - Abnormal; Notable for the following components:      Result Value   Glucose, Bld 123 (*)    All other components within normal limits  CBC - Abnormal; Notable for the following components:   Hemoglobin 10.0 (*)    HCT 33.1 (*)    MCV 75.6 (*)    MCH 22.8 (*)    RDW 18.7 (*)    All other components within normal limits  BRAIN NATRIURETIC PEPTIDE - Abnormal; Notable for the following components:   B Natriuretic Peptide 2,160.4 (*)    All other components within normal limits  COMPREHENSIVE METABOLIC PANEL WITH GFR  CBC  TSH  TROPONIN I (HIGH SENSITIVITY)  TROPONIN I (HIGH SENSITIVITY)      EKG   EKG Interpretation Date/Time:  Wednesday May 27 2024 18:17:11 EDT Ventricular Rate:  137 PR Interval:  170 QRS Duration:  140 QT Interval:  344 QTC Calculation: 519 R Axis:   -42  Text Interpretation: Atrial fibrillation with LBBB Left ventricular hypertrophy with QRS widening and repolarization abnormality (  Sokolow-Lyon , [employer] product ) Abnormal ECG When compared with ECG of 05-Apr-2024 13:44, PREVIOUS ECG IS PRESENT Reconfirmed by Jessikah Dicker ([id number]) on 05/27/2024 6:21:22 PM         Imaging Studies ordered: I ordered imaging studies including chest x-ray, PE study I independently visualized and interpreted imaging. I agree with the radiologist interpretation   Medicines ordered and prescription drug management: Meds ordered this encounter  Medications   furosemide  (LASIX ) injection 40 mg   iohexol  (OMNIPAQUE ) 350 MG/ML injection 75 mL   DISCONTD: amiodarone  (NEXTERONE  PREMIX) 360-4.14 MG/200ML-% (1.8 mg/mL) IV infusion   DISCONTD: amiodarone  (NEXTERONE  PREMIX) 360-4.14 MG/200ML-% (1.8 mg/mL) IV infusion   acetaminophen  (TYLENOL ) tablet 1,000 mg   traMADol  (ULTRAM ) tablet 25-50 mg   amLODipine  (NORVASC ) tablet 5 mg   metoprolol  tartrate (LOPRESSOR ) tablet 25 mg   lipase/protease/amylase (CREON ) capsule 36,000 Units   pantoprazole  (PROTONIX ) EC tablet 40 mg   rivaroxaban  (XARELTO ) tablet 20 mg   insulin  aspart (novoLOG ) injection 0-9 Units    Correction coverage::   Sensitive (thin, NPO, renal)    CBG < 70::   Implement Hypoglycemia Standing Orders and refer to Hypoglycemia Standing Orders sidebar report    CBG 70 - 120::   0 units    CBG 121 - 150::   1 unit    CBG 151 - 200::   2 units    CBG 201 - 250::   3 units    CBG 251 - 300::   5 units    CBG 301 - 350::   7 units    CBG 351 - 400:   9 units    CBG > 400:   call MD and obtain STAT lab verification   simethicone  (MYLICON) chewable tablet 80 mg    -I have reviewed the patients home medicines and have made adjustments as needed  Critical interventions Supplemental oxygen, cardiology consultation  Consultations Obtained: I requested consultation with the cardiologist on-call,  and discussed lab and imaging findings as well as pertinent plan - they recommend: Beta-blocker therapy, Amio if symptoms return   Cardiac  Monitoring: The patient was maintained on a cardiac monitor.  I personally viewed and interpreted the cardiac monitored which showed an underlying rhythm of: NSR, A-fib with RVR  Social Determinants of Health:  Factors impacting patients care include: none   Reevaluation: After the interventions noted above, I reevaluated the patient and found that they have :improved  Co morbidities that complicate the patient evaluation  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 Not seen on most recent TEE 2019.   Cardiomyopathy in other disease    Cataracts, bilateral    Diabetes mellitus    Type 2   Generalized osteoarthritis    GERD (gastroesophageal reflux disease)    HLD (hyperlipidemia)    Hypertension    Hypothyroidism    LBBB (left bundle branch block)    PAF (paroxysmal atrial fibrillation) (HCC)    a. s/p DCCV 06/2017, on Xarelto    Phlebitis    S/P TAVR (transcatheter aortic valve replacement) 04/15/2018   Edwards Sapien 3 THV (size 23 mm, model # 9600TFX, serial # [id number]) via the TF approach   Severe aortic stenosis    a. s/p TAVR 04/2018.   Stroke [facility]) 2008      Dispostion: I considered admission for this patient, and patient require hospital  admission for fluid overload     Final Clinical Impression(s) / ED Diagnoses Final diagnoses:  Hypoxia  Atrial fibrillation, unspecified type [facility])     @PCDICTATION @    Albertina Dixon, MD 05/28/24 [phone]

## 2024-05-27 NOTE — ED Triage Notes (Signed)
 Pt arrives via POV. PT c/o sob, headache, and back pain that started today. Pt is AxOx4. Hx of pancreatic cancer, states she is not currently undergoing any treatment. Denies any other symptoms.

## 2024-05-27 NOTE — Consult Note (Signed)
 Cardiology Consultation   Patient ID: KHALAYA MCGURN MRN: [id number]; DOB: 1941-06-23  Admit date: 05/27/2024 Date of Consult: 05/27/2024  PCP:  Leonel Cole, MD   [facility] Providers Cardiologist:  Oneil Parchment, MD     Patient Profile: Janet Williamson is a 83 y.o. female with a hx of metastatic pancreatic cancer, pAfib, HFpEF, severe AS s/p TAVR in 04/2018, DM, htn, CVA in 2008 who is being seen 05/27/2024 for the evaluation of afib with RVR and acute decompensated heart failure at the request of Dr. Franky.  History of Present Illness: Janet Williamson presents today for shortness of breath and a headache. She states she feels like she has additional fluid and needs extra diuresis to get some fluid off. She also reports a feeling of palpitations, diaphoresis, and chest pain when she goes into afib with RVR. She self converted to sinus rhythm while in the ED and now feels much better.   She developed afib with RVR while lying flat in the ED but self converted. She received Lasix  40mg  IV x1 and has had a good urinary response. Overall, she states she feels moderately fluid overloaded.   The patient had a recent hospitalization here back in May of 2025 (5/7-5/14) for presumed endocarditis. TTE at that time showed evidence of an aortic valve vegetation. She was deemed not a candidate for any intervention and was treated with 6 weeks of antibiotics. She was deemed not a candidate for intervention given her pancreatitic cancer. In the meantime, she has completed the course of abx (ceftriaxone  and daptomycin ). No recurrent fever and WBC is not elevated here. She saw Oncology during an outpatient appointment on 04/28/24, and chemotherapy was discontinued at that time with a shift to focus on supportive care. She follows with Palliative cancer but is not on hospice.  Past Medical History:  Diagnosis Date   Acute lower GI bleeding 05/2019   Anemia    Cardiac mass    a. 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 Not seen on most recent TEE 2019.   Cardiomyopathy in other disease    Cataracts, bilateral    Diabetes mellitus    Type 2   Generalized osteoarthritis    GERD (gastroesophageal reflux disease)    HLD (hyperlipidemia)    Hypertension    Hypothyroidism    LBBB (left bundle branch block)    PAF (paroxysmal atrial fibrillation) (HCC)    a. s/p DCCV 06/2017, on Xarelto    Phlebitis    S/P TAVR (transcatheter aortic valve replacement) 04/15/2018   Edwards Sapien 3 THV (size 23 mm, model # 9600TFX, serial # [id number]) via the TF approach   Severe aortic stenosis    a. s/p TAVR 04/2018.   Stroke [facility]) 2008    Past Surgical History:  Procedure Laterality Date   ABDOMINAL HYSTERECTOMY     BILIARY BRUSHING  10/16/2023   Procedure: BILIARY BRUSHING;  Surgeon: Burnette Fallow, MD;  Location: [facility];  Service: Gastroenterology;;   BILIARY STENT PLACEMENT N/A 10/16/2023   Procedure: BILIARY STENT PLACEMENT;  Surgeon: Burnette Fallow, MD;  Location: [facility];  Service: Gastroenterology;  Laterality: N/A;   BIOPSY  05/19/2019   Procedure: BIOPSY;  Surgeon: Donnald Charleston, MD;  Location: [facility] ENDOSCOPY;  Service: Endoscopy;;   BREAST BIOPSY Left    years ago at a doctor's office   COLONOSCOPY     COLONOSCOPY WITH PROPOFOL  N/A 10/02/2018   Procedure: COLONOSCOPY WITH PROPOFOL ;  Surgeon: Burnette Fallow, MD;  Location: [facility];  Service: Endoscopy;  Laterality: N/A;   ENTEROSCOPY N/A 05/19/2019   Procedure: ENTEROSCOPY;  Surgeon: Donnald Charleston, MD;  Location: [facility] ENDOSCOPY;  Service: Endoscopy;  Laterality: N/A;   ERCP N/A 10/16/2023   Procedure: ENDOSCOPIC RETROGRADE CHOLANGIOPANCREATOGRAPHY (ERCP);  Surgeon: Burnette Fallow, MD;  Location: [facility];  Service: Gastroenterology;  Laterality: N/A;   ESOPHAGOGASTRODUODENOSCOPY (EGD) WITH PROPOFOL  N/A 10/02/2018   Procedure: ESOPHAGOGASTRODUODENOSCOPY (EGD) WITH PROPOFOL ;  Surgeon: Burnette Fallow, MD;  Location: [facility]  ENDOSCOPY;  Service: Endoscopy;  Laterality: N/A;   ESOPHAGOGASTRODUODENOSCOPY (EGD) WITH PROPOFOL  N/A 04/23/2019   Procedure: ESOPHAGOGASTRODUODENOSCOPY (EGD) WITH PROPOFOL ;  Surgeon: Lennard Lesta FALCON, MD;  Location: [facility] ENDOSCOPY;  Service: Endoscopy;  Laterality: N/A;   ESOPHAGOGASTRODUODENOSCOPY (EGD) WITH PROPOFOL  N/A 10/16/2023   Procedure: ESOPHAGOGASTRODUODENOSCOPY (EGD) WITH PROPOFOL ;  Surgeon: Burnette Fallow, MD;  Location: [facility];  Service: Gastroenterology;  Laterality: N/A;   EUS N/A 10/16/2023   Procedure: FULL UPPER ENDOSCOPIC ULTRASOUND (EUS) RADIAL;  Surgeon: Burnette Fallow, MD;  Location: [facility];  Service: Gastroenterology;  Laterality: N/A;   EYE SURGERY Bilateral    cataract removal   FINE NEEDLE ASPIRATION N/A 10/16/2023   Procedure: FINE NEEDLE ASPIRATION (FNA) LINEAR;  Surgeon: Burnette Fallow, MD;  Location: [facility];  Service: Gastroenterology;  Laterality: N/A;   GIVENS CAPSULE STUDY N/A 10/02/2018   Procedure: GIVENS CAPSULE STUDY;  Surgeon: Burnette Fallow, MD;  Location: [facility] ENDOSCOPY;  Service: Endoscopy;  Laterality: N/A;   IR IMAGING GUIDED PORT INSERTION  01/10/2024   RIGHT/LEFT HEART CATH AND CORONARY ANGIOGRAPHY N/A 03/06/2018   Procedure: RIGHT/LEFT HEART CATH AND CORONARY ANGIOGRAPHY;  Surgeon: Claudene Victory ORN, MD;  Location: [facility];  Service: Cardiovascular;  Laterality: N/A;   SMALL BOWEL ENTEROSCOPY  05/19/2019   SPHINCTEROTOMY  10/16/2023   Procedure: SPHINCTEROTOMY;  Surgeon: Burnette Fallow, MD;  Location: [facility];  Service: Gastroenterology;;   TEE WITHOUT CARDIOVERSION N/A 04/01/2018   Procedure: TRANSESOPHAGEAL ECHOCARDIOGRAM (TEE);  Surgeon: Jeffrie Oneil BROCKS, MD;  Location: [facility] ENDOSCOPY;  Service: Cardiovascular;  Laterality: N/A;   TEE WITHOUT CARDIOVERSION N/A 04/15/2018   Procedure: TRANSESOPHAGEAL ECHOCARDIOGRAM (TEE);  Surgeon: Wonda Sharper, MD;  Location: [facility] OR;  Service: Open Heart Surgery;  Laterality: N/A;   TOTAL KNEE  ARTHROPLASTY Right 04/14/2013   Dr Addie   TOTAL KNEE ARTHROPLASTY Right 04/14/2013   Procedure: TOTAL KNEE ARTHROPLASTY;  Surgeon: Cordella Glendia Addie, MD;  Location: [facility] OR;  Service: Orthopedics;  Laterality: Right;   TRANSCATHETER AORTIC VALVE REPLACEMENT, TRANSFEMORAL  04/15/2018   TRANSCATHETER AORTIC VALVE REPLACEMENT, TRANSFEMORAL Bilateral 04/15/2018   Procedure: TRANSCATHETER AORTIC VALVE REPLACEMENT, TRANSFEMORAL;  Surgeon: Wonda Sharper, MD;  Location: [facility] OR;  Service: Open Heart Surgery;  Laterality: Bilateral;    Home Medications:  Prior to Admission medications   Medication Sig Start Date End Date Taking? Authorizing Provider  acetaminophen  (TYLENOL ) 500 MG tablet Take 1,000 mg by mouth every 8 (eight) hours as needed for mild pain (pain score 1-3) (for headaches).   Yes [provider]  amLODipine  (NORVASC ) 5 MG tablet Take 5 mg by mouth daily.   Yes [provider]  baclofen  (LIORESAL ) 10 MG tablet Take 1 tablet (10 mg total) by mouth 3 (three) times daily. Patient taking differently: Take 10 mg by mouth as needed for muscle spasms. 03/11/24  Yes Pickenpack-Cousar, Fannie SAILOR, NP  Cholecalciferol  (VITAMIN D3) 5000 units CAPS Take 5,000 Units by mouth daily.   Yes [provider]  CREON  36000-114000 units CPEP capsule TAKE TWO (2) CAPSULES BY  MOUTH THREE TIMES A DAY WITH MEALS TAKE 1 CAPSULE BY MOUTH AS NEEDED WITH SNACK. MAX 10/DAY *NEW PRESCRIPTION REQUEST* 02/28/24  Yes Boscia, Heather E, NP  furosemide  (LASIX ) 20 MG tablet Take 1 tablet (20 mg total) by mouth daily. Can take an additional tablet as needed for weight gain 03/06/24  Yes Wyn Jackee VEAR Mickey., NP  lidocaine -prilocaine  (EMLA ) cream APPLY A QUARTER SIZE AMOUNT OF CREAM TO PORT - A- CATH 45 MINUTES TO 1 HOUR PRIOR TO PORT-A-CATH BEING ACCESS. *NEW PRESCRIPTION REQUEST* 02/28/24  Yes Boscia, Powell BRAVO, NP  loperamide  (IMODIUM ) 2 MG capsule Take 1 capsule (2 mg total) by mouth 4 (four) times daily as needed  for diarrhea or loose stools. 04/05/24  Yes Charlyn Sora, MD  metFORMIN  (GLUCOPHAGE -XR) 750 MG 24 hr tablet TAKE 2 TABLETS ONE TIME DAILY WITH BREAKFAST 11/27/23  Yes Thapa, [place], MD  metoprolol  tartrate (LOPRESSOR ) 25 MG tablet Take 1 tablet (25 mg total) by mouth 2 (two) times daily. Patient not taking: Reported on 05/27/2024 02/18/24   Patsy Lenis, MD  pantoprazole  (PROTONIX ) 20 MG tablet TAKE 1 TABLET BY MOUTH DAILY *NEW PRESCRIPTION REQUEST* 02/28/24   Boscia, Heather E, NP  Polyethyl Glycol-Propyl Glycol (SYSTANE OP) Place 1 drop into both eyes 2 (two) times daily.     [provider]  rivaroxaban  (XARELTO ) 20 MG TABS tablet Take 1 tablet (20 mg total) by mouth daily with supper. 02/09/24   Patt Lenis Macho, MD  traMADol  (ULTRAM ) 50 MG tablet Take 0.5-1 tablets (25-50 mg total) by mouth every 6 (six) hours as needed. 03/11/24   Pickenpack-Cousar, Athena N, NP   Scheduled Meds:  Continuous Infusions:  PRN Meds:  Allergies:    Allergies  Allergen Reactions   Ace Inhibitors Swelling and Other (See Comments)    Angioedema    Keflex [Cephalexin] Swelling and Other (See Comments)    Lips became swollen   Rifadin [Rifampin] Swelling and Other (See Comments)    Lips became swollen   Social History:   Social History   Socioeconomic History   Marital status: Widowed    Spouse name: Not on file   Number of children: Not on file   Years of education: Not on file   Highest education level: Not on file  Occupational History   Not on file  Tobacco Use   Smoking status: Never   Smokeless tobacco: Never  Vaping Use   Vaping status: Never Used  Substance and Sexual Activity   Alcohol use: No   Drug use: No   Sexual activity: Not Currently  Other Topics Concern   Not on file  Social History Narrative   Not on file   Social Drivers of Health   Financial Resource Strain: Not on file  Food Insecurity: No Food Insecurity (02/14/2024)   Hunger Vital Sign    Worried About  Running Out of Food in the Last Year: Never true    Ran Out of Food in the Last Year: Never true  Transportation Needs: No Transportation Needs (02/14/2024)   PRAPARE - Administrator, Civil Service (Medical): No    Lack of Transportation (Non-Medical): No  Physical Activity: Not on file  Stress: Not on file  Social Connections: Unknown (02/14/2024)   Social Connection and Isolation Panel    Frequency of Communication with Friends and Family: More than three times a week    Frequency of Social Gatherings with Friends and Family: Three times a week  Attends Religious Services: 1 to 4 times per year    Active Member of Clubs or Organizations: Yes    Attends Banker Meetings: 1 to 4 times per year    Marital Status: Patient declined  Intimate Partner Violence: Not At Risk (02/14/2024)   Humiliation, Afraid, Rape, and Kick questionnaire    Fear of Current or Ex-Partner: No    Emotionally Abused: No    Physically Abused: No    Sexually Abused: No    Family History:   Family History  Problem Relation Age of Onset   Stroke Mother    Hypertension Mother    Hypertension Father    Heart attack Neg Hx     ROS:  Please see the history of present illness.  All other ROS reviewed and negative.     Physical Exam/Data: Vitals:   05/27/24 2000 05/27/24 2035 05/27/24 2100 05/27/24 2115  BP: (!) 165/68  (!) 174/73 (!) 171/74  Pulse: 84 (!) 116 92 88  Resp: (!) 23 (!) 27 (!) 24 (!) 27  Temp:      SpO2: 98% (!) 84% 100% 97%  Weight:      Height:        Intake/Output Summary (Last 24 hours) at 05/27/2024 2203 Last data filed at 05/27/2024 2145 Gross per 24 hour  Intake --  Output 750 ml  Net -750 ml      05/27/2024    6:06 PM 04/28/2024    9:27 AM 04/21/2024    8:42 AM  Last 3 Weights  Weight (lbs) 141 lb 15.6 oz 141 lb 14.4 oz 139 lb 9.6 oz  Weight (kg) 64.4 kg 64.365 kg 63.322 kg     Body mass index is 25.15 kg/m.  General:  ill appearing, well developed,  in no acute distress HEENT: normal Neck: JVD elevated to mid neck Vascular: No carotid bruits; Distal pulses 2+ bilaterally Cardiac:  normal S1, S2; RRR; systolic murmur present Lungs:  crackles at the bases Abd: soft, nontender, no hepatomegaly  Ext: mild edema Musculoskeletal:  No deformities, BUE and BLE strength normal and equal Skin: warm and dry  Neuro:  no focal abnormalities noted Psych:  Normal affect   EKG:  The EKG was personally reviewed and demonstrates:  Afib with RVR, LBBB Telemetry:  Telemetry was personally reviewed and demonstrates:  Sinus rhythm with LBB morphology  Relevant CV Studies: TTE 03/25/24 IMPRESSIONS    1. Left ventricular ejection fraction, by estimation, is 65 to 70%. Left  ventricular ejection fraction by 3D volume is 65 %. The left ventricle has  normal function. The left ventricle has no regional wall motion  abnormalities. There is moderate concentric left ventricular hypertrophy. Left ventricular diastolic parameters are consistent with Grade I diastolic dysfunction (impaired relaxation). The average left ventricular global longitudinal strain is  -15.3 %. The global longitudinal strain is normal.   2. Right ventricular systolic function is normal. The right ventricular  size is normal. There is moderately elevated pulmonary artery systolic  pressure. The estimated right ventricular systolic pressure is 52.9 mmHg.   3. Left atrial size was severely dilated.   4. Right atrial size was moderately dilated.   5. The mitral valve is degenerative. Moderate mitral valve regurgitation.  No evidence of mitral stenosis. Moderate mitral annular calcification.   6. Tricuspid valve regurgitation is moderate.   7. The TAVR valve appears markedly thickened. Gradients are stable since  study of 02/13/24 and are consistent with severe prosthetic valve  AS . The  aortic valve has been repaired/replaced. Aortic valve regurgitation is  mild. No aortic stenosis is  present.   Procedure Date: 7//. Echo findings are consistent with normal structure  and function of the aortic valve prosthesis. Echo findings are consistent  with stenosis of the aortic prosthesis. Aortic valve area, by VTI measures  0.92 cm. Aortic valve mean  gradient measures 57.0 mmHg. Aortic valve Vmax measures 5.10 m/s.   8. Pulmonic valve regurgitation is moderate to severe.   9. Aortic dilatation noted. There is borderline dilatation of the  ascending aorta, measuring 38 mm.  10. The inferior vena cava is dilated in size with >50% respiratory  variability, suggesting right atrial pressure of 8 mmHg.   Laboratory Data: High Sensitivity Troponin:   Recent Labs  Lab 05/27/24 1821 05/27/24 2113  TROPONINIHS 10 9     Chemistry Recent Labs  Lab 05/27/24 1821  NA 135  K 3.6  CL 99  CO2 26  GLUCOSE 123*  BUN 8  CREATININE 0.54  CALCIUM  9.6  GFRNONAA >60  ANIONGAP 10    Hematology Recent Labs  Lab 05/27/24 1821  WBC 9.7  RBC 4.38  HGB 10.0*  HCT 33.1*  MCV 75.6*  MCH 22.8*  MCHC 30.2  RDW 18.7*  PLT 343   BNP Recent Labs  Lab 05/27/24 1821  BNP 2,160.4*   Radiology/Studies:  CT Angio Chest PE W and/or Wo Contrast Result Date: 05/27/2024 CLINICAL DATA:  Concern for pulmonary embolism.  Pancreatic cancer. EXAM: CT ANGIOGRAPHY CHEST WITH CONTRAST TECHNIQUE: Multidetector CT imaging of the chest was performed using the standard protocol during bolus administration of intravenous contrast. Multiplanar CT image reconstructions and MIPs were obtained to evaluate the vascular anatomy. RADIATION DOSE REDUCTION: This exam was performed according to the departmental dose-optimization program which includes automated exposure control, adjustment of the mA and/or kV according to patient size and/or use of iterative reconstruction technique. CONTRAST:  75mL OMNIPAQUE  IOHEXOL  350 MG/ML SOLN COMPARISON:  Chest radiograph dated 05/27/2024. FINDINGS: Cardiovascular: There is  mild cardiomegaly. No pericardial effusion. There is calcification of the mitral annulus. Aortic valve repair. There is mild atherosclerotic calcification of the thoracic aorta. No aneurysmal dilatation. No pulmonary artery embolus identified. Mediastinum/Nodes: There is soft tissue fullness of the hilum and mediastinum. Prevascular space lymph nodes measure 14 mm short axis. The esophagus is grossly unremarkable. Heterogeneously enlarged thyroid  gland in keeping with multinodular goiter. This has been evaluated on previous imaging. (ref: J Am Coll Radiol. 2015 Feb;12(2): 143-50).No mediastinal fluid collection. Right-sided Port-A-Cath with tip in the right atrium. Lungs/Pleura: Small bilateral pleural effusions. There is diffuse interstitial and interlobular septal prominence consistent with edema. Scattered pulmonary nodularity may represent edema or superimposed pneumonia. There is a 1 cm nodule in the right lower lobe. There is no pneumothorax. The central airways are patent. Upper Abdomen: Pneumobilia. Musculoskeletal: Osteopenia with degenerative changes spine. No acute osseous pathology. Review of the MIP images confirms the above findings. IMPRESSION: 1. No CT evidence of pulmonary artery embolus. 2. Cardiomegaly with findings of CHF and small bilateral pleural effusions. 3. Scattered pulmonary nodularity may represent edema or superimposed pneumonia. 4. A 1 cm nodule in the right lower lobe. 5.  Aortic Atherosclerosis (ICD10-I70.0). Electronically Signed   By: Vanetta Chou M.D.   On: 05/27/2024 20:33   DG Chest 2 View Result Date: 05/27/2024 CLINICAL DATA:  Shortness of breath EXAM: CHEST - 2 VIEW COMPARISON:  02/15/2024 FINDINGS: Right Port-A-Cath remains in place, unchanged.  Heart and mediastinal contours are within normal limits. Aortic atherosclerosis. Prior aortic valve repair. Bilateral pulmonary infiltrates noted, right greater than left. No effusions. No acute bony abnormality. IMPRESSION:  Bilateral pulmonary infiltrates, right greater than left. This could reflect edema or infection. Electronically Signed   By: Franky Crease M.D.   On: 05/27/2024 19:15   Assessment and Plan: Atrial fibrillation with RVR, paroxysmal  Acute on chronic decompensated diastolic heart failure with preserved EF Severe prosthetic valve aortic stenosis Moderate mitral valve regurgitation Pancreatic cancer  History of CVA Hypertension The patient presented with acute decompensated heart failure and had an episode of symptomatic afib with RVR. She then self-converted to sinus. It seems that her HF is driving this episode of afib, and I'm hopeful that with diuresis, she will have less episodes of afib. She is symptomatic from a HF but also much more symptomatic during the afib episodes. For now, we can restart her beta blocker and add amiodarone  if she has any additional episodes of symptomatic afib. She has significant mitral and aortic valvular disease, however, I do not think she would be a candidate for any intervention given her metastatic pancreatitic cancer. Low concern for recurrent endocarditis. We recommend continued diuresis, but I suspect we can transition back to her home PO regimen in the next few days. - Continue metoprolol  25 mg q12  - Amiodarone  150 mg followed by amio gtt (1mig/min for 6hrs then 2mg /min thereafter) if patient develops another episode of symptomatic afib with RVR - Continue Xarelto  - Lasix  40mg  IV bid for now - Continue amlodipine , may need additional PRN antihypertensive if BP remains elevated  - Please obtain blood cultures if she develops a fever  Risk Assessment/Risk Scores:    [place]  Heart Association (NYHA) Functional Class NYHA Class II  CHA2DS2-VASc Score = 9   This indicates a 12.2% annual risk of stroke. The patient's score is based upon: CHF History: 1 HTN History: 1 Diabetes History: 1 Stroke History: 2 Vascular Disease History: 1 Age Score: 2 Gender  Score: 1   For questions or updates, please contact [facility] Please consult [email or web address] for contact info under    Signed, Jerrell DELENA Orchard, MD  05/27/2024 10:03 PM

## 2024-05-27 NOTE — ED Notes (Signed)
 While getting off the bedpan the pt began becoming short of breath and became tachycardic. NT placed pt on 4L [place]. Upon entering the room the pt was on 84% on 4L, increased to [facility] and at 87%. Pt placed NRB. Dr. Albertina at bedside.

## 2024-05-27 NOTE — ED Notes (Signed)
 Patient transported to CT

## 2024-05-27 NOTE — ED Triage Notes (Signed)
 The pt could not breathe last pm because she could not breathe well lying down  pain across both shoulders

## 2024-05-27 NOTE — ED Notes (Signed)
 Patient transported to X-ray

## 2024-05-27 NOTE — ED Notes (Signed)
Pa at the bedside

## 2024-05-28 DIAGNOSIS — Z953 Presence of xenogenic heart valve: Secondary | ICD-10-CM | POA: Diagnosis not present

## 2024-05-28 DIAGNOSIS — Z8673 Personal history of transient ischemic attack (TIA), and cerebral infarction without residual deficits: Secondary | ICD-10-CM | POA: Diagnosis not present

## 2024-05-28 DIAGNOSIS — Z8507 Personal history of malignant neoplasm of pancreas: Secondary | ICD-10-CM | POA: Diagnosis not present

## 2024-05-28 DIAGNOSIS — I480 Paroxysmal atrial fibrillation: Principal | ICD-10-CM

## 2024-05-28 DIAGNOSIS — Z96651 Presence of right artificial knee joint: Secondary | ICD-10-CM | POA: Diagnosis present

## 2024-05-28 DIAGNOSIS — Z8249 Family history of ischemic heart disease and other diseases of the circulatory system: Secondary | ICD-10-CM | POA: Diagnosis not present

## 2024-05-28 DIAGNOSIS — E876 Hypokalemia: Secondary | ICD-10-CM

## 2024-05-28 DIAGNOSIS — E785 Hyperlipidemia, unspecified: Secondary | ICD-10-CM | POA: Diagnosis present

## 2024-05-28 DIAGNOSIS — R0902 Hypoxemia: Secondary | ICD-10-CM | POA: Diagnosis present

## 2024-05-28 DIAGNOSIS — Z79899 Other long term (current) drug therapy: Secondary | ICD-10-CM | POA: Diagnosis not present

## 2024-05-28 DIAGNOSIS — Z66 Do not resuscitate: Secondary | ICD-10-CM | POA: Diagnosis present

## 2024-05-28 DIAGNOSIS — Z888 Allergy status to other drugs, medicaments and biological substances status: Secondary | ICD-10-CM | POA: Diagnosis not present

## 2024-05-28 DIAGNOSIS — I350 Nonrheumatic aortic (valve) stenosis: Secondary | ICD-10-CM | POA: Diagnosis present

## 2024-05-28 DIAGNOSIS — Z823 Family history of stroke: Secondary | ICD-10-CM | POA: Diagnosis not present

## 2024-05-28 DIAGNOSIS — E039 Hypothyroidism, unspecified: Secondary | ICD-10-CM | POA: Diagnosis present

## 2024-05-28 DIAGNOSIS — Y831 Surgical operation with implant of artificial internal device as the cause of abnormal reaction of the patient, or of later complication, without mention of misadventure at the time of the procedure: Secondary | ICD-10-CM | POA: Diagnosis present

## 2024-05-28 DIAGNOSIS — Z7901 Long term (current) use of anticoagulants: Secondary | ICD-10-CM | POA: Diagnosis not present

## 2024-05-28 DIAGNOSIS — I081 Rheumatic disorders of both mitral and tricuspid valves: Secondary | ICD-10-CM | POA: Diagnosis present

## 2024-05-28 DIAGNOSIS — I110 Hypertensive heart disease with heart failure: Secondary | ICD-10-CM | POA: Diagnosis present

## 2024-05-28 DIAGNOSIS — E1165 Type 2 diabetes mellitus with hyperglycemia: Secondary | ICD-10-CM | POA: Diagnosis present

## 2024-05-28 DIAGNOSIS — K219 Gastro-esophageal reflux disease without esophagitis: Secondary | ICD-10-CM | POA: Diagnosis present

## 2024-05-28 DIAGNOSIS — T82857A Stenosis of cardiac prosthetic devices, implants and grafts, initial encounter: Secondary | ICD-10-CM | POA: Diagnosis present

## 2024-05-28 DIAGNOSIS — I5033 Acute on chronic diastolic (congestive) heart failure: Secondary | ICD-10-CM | POA: Diagnosis present

## 2024-05-28 DIAGNOSIS — Z7984 Long term (current) use of oral hypoglycemic drugs: Secondary | ICD-10-CM | POA: Diagnosis not present

## 2024-05-28 LAB — FERRITIN: Ferritin: 59 ng/mL (ref 11–307)

## 2024-05-28 LAB — COMPREHENSIVE METABOLIC PANEL WITH GFR
ALT: 16 U/L (ref 0–44)
AST: 19 U/L (ref 15–41)
Albumin: 2.8 g/dL — ABNORMAL LOW (ref 3.5–5.0)
Alkaline Phosphatase: 66 U/L (ref 38–126)
Anion gap: 11 (ref 5–15)
BUN: 7 mg/dL — ABNORMAL LOW (ref 8–23)
CO2: 30 mmol/L (ref 22–32)
Calcium: 9.3 mg/dL (ref 8.9–10.3)
Chloride: 96 mmol/L — ABNORMAL LOW (ref 98–111)
Creatinine, Ser: 0.56 mg/dL (ref 0.44–1.00)
GFR, Estimated: >60 mL/min (ref >60)
Glucose, Bld: 115 mg/dL — ABNORMAL HIGH (ref 70–99)
Potassium: 3.3 mmol/L — ABNORMAL LOW (ref 3.5–5.1)
Sodium: 137 mmol/L (ref 135–145)
Total Bilirubin: 0.7 mg/dL (ref 0.0–1.2)
Total Protein: 6.9 g/dL (ref 6.5–8.1)

## 2024-05-28 LAB — IRON AND TIBC
Iron: 16 ug/dL — ABNORMAL LOW (ref 28–170)
Saturation Ratios: 5 % — ABNORMAL LOW (ref 10.4–31.8)
TIBC: 321 ug/dL (ref 250–450)
UIBC: 305 ug/dL

## 2024-05-28 LAB — CBC
HCT: 31 % — ABNORMAL LOW (ref 36.0–46.0)
Hemoglobin: 9.7 g/dL — ABNORMAL LOW (ref 12.0–15.0)
MCH: 23 pg — ABNORMAL LOW (ref 26.0–34.0)
MCHC: 31.3 g/dL (ref 30.0–36.0)
MCV: 73.6 fL — ABNORMAL LOW (ref 80.0–100.0)
Platelets: 326 K/uL (ref 150–400)
RBC: 4.21 MIL/uL (ref 3.87–5.11)
RDW: 18.6 % — ABNORMAL HIGH (ref 11.5–15.5)
WBC: 9.6 K/uL (ref 4.0–10.5)
nRBC: 0 % (ref 0.0–0.2)

## 2024-05-28 LAB — TSH: TSH: 1.176 u[IU]/mL (ref 0.350–4.500)

## 2024-05-28 LAB — GLUCOSE, CAPILLARY
Glucose-Capillary: 134 mg/dL — ABNORMAL HIGH (ref 70–99)
Glucose-Capillary: 166 mg/dL — ABNORMAL HIGH (ref 70–99)
Glucose-Capillary: 142 mg/dL — ABNORMAL HIGH (ref 70–99)
Glucose-Capillary: 127 mg/dL — ABNORMAL HIGH (ref 70–99)

## 2024-05-28 LAB — MAGNESIUM: Magnesium: 1.5 mg/dL — ABNORMAL LOW (ref 1.7–2.4)

## 2024-05-28 MED ORDER — POTASSIUM CHLORIDE CRYS ER 20 MEQ PO TBCR
60.0000 meq | EXTENDED_RELEASE_TABLET | Freq: Once | ORAL | Status: AC
  Administered 2024-05-28: 60 meq via ORAL
  Filled 2024-05-28: qty 3

## 2024-05-28 MED ORDER — MAGNESIUM SULFATE 2 GM/50ML IV SOLN
2.0000 g | Freq: Once | INTRAVENOUS | Status: AC
  Administered 2024-05-28: 2 g via INTRAVENOUS
  Filled 2024-05-28: qty 50

## 2024-05-28 MED ORDER — SODIUM CHLORIDE 0.9% FLUSH
10.0000 mL | Freq: Two times a day (BID) | INTRAVENOUS | Status: DC
  Administered 2024-05-28 – 2024-05-29 (×3): 10 mL

## 2024-05-28 MED ORDER — AMIODARONE HCL IN DEXTROSE 360-4.14 MG/200ML-% IV SOLN
30.0000 mg/h | INTRAVENOUS | Status: DC
  Administered 2024-05-28 – 2024-05-29 (×2): 30 mg/h via INTRAVENOUS
  Filled 2024-05-28 (×2): qty 200

## 2024-05-28 MED ORDER — AMIODARONE HCL IN DEXTROSE 360-4.14 MG/200ML-% IV SOLN
60.0000 mg/h | INTRAVENOUS | Status: AC
  Administered 2024-05-28 (×2): 60 mg/h via INTRAVENOUS
  Filled 2024-05-28 (×2): qty 200

## 2024-05-28 MED ORDER — FUROSEMIDE 10 MG/ML IJ SOLN
40.0000 mg | Freq: Two times a day (BID) | INTRAMUSCULAR | Status: AC
  Administered 2024-05-28 – 2024-05-29 (×3): 40 mg via INTRAVENOUS
  Filled 2024-05-28 (×3): qty 4

## 2024-05-28 MED ORDER — AMIODARONE LOAD VIA INFUSION
150.0000 mg | Freq: Once | INTRAVENOUS | Status: AC
  Administered 2024-05-28: 150 mg via INTRAVENOUS
  Filled 2024-05-28: qty 83.34

## 2024-05-28 MED ORDER — METOPROLOL TARTRATE 5 MG/5ML IV SOLN
5.0000 mg | Freq: Four times a day (QID) | INTRAVENOUS | Status: DC | PRN
  Administered 2024-05-28: 5 mg via INTRAVENOUS
  Filled 2024-05-28: qty 5

## 2024-05-28 MED ORDER — SODIUM CHLORIDE 0.9% FLUSH: 10.0000 mL | INTRAVENOUS | Status: DC | PRN

## 2024-05-28 MED ORDER — POLYETHYLENE GLYCOL 3350 17 G PO PACK
17.0000 g | PACK | Freq: Two times a day (BID) | ORAL | Status: DC
  Administered 2024-05-28 – 2024-05-29 (×2): 17 g via ORAL
  Filled 2024-05-28 (×2): qty 1

## 2024-05-28 MED ORDER — CHLORHEXIDINE GLUCONATE CLOTH 2 % EX PADS
6.0000 | MEDICATED_PAD | Freq: Every day | CUTANEOUS | Status: DC
  Administered 2024-05-28: 6 via TOPICAL

## 2024-05-28 NOTE — Progress Notes (Signed)
 Rounding Note   Patient Name: Janet Williamson Date of Encounter: 05/28/2024  [facility] Cardiologist: Oneil Parchment, MD   Subjective  Pt denies CP; dyspnea much improved.  Scheduled Meds:  furosemide   40 mg Intravenous Q12H   insulin  aspart  0-9 Units Subcutaneous TID WC   lipase/protease/amylase  36,000 Units Oral TID AC   metoprolol  tartrate  25 mg Oral BID   pantoprazole   40 mg Oral Daily   rivaroxaban   20 mg Oral Daily   Continuous Infusions:  PRN Meds: acetaminophen , metoprolol  tartrate, simethicone , traMADol    Vital Signs  Vitals:   05/27/24 2226 05/27/24 2243 05/27/24 2309 05/28/24 0330  BP:   (!) 157/60 139/60  Pulse:   77 74  Resp:   18 20  Temp: 98.9 F (37.2 C)  98.9 F (37.2 C) 98.4 F (36.9 C)  TempSrc: Oral  Oral Oral  SpO2:  100% 100% 100%  Weight:      Height:        Intake/Output Summary (Last 24 hours) at 05/28/2024 0818 Last data filed at 05/27/2024 2145 Gross per 24 hour  Intake --  Output 750 ml  Net -750 ml      05/27/2024    6:06 PM 04/28/2024    9:27 AM 04/21/2024    8:42 AM  Last 3 Weights  Weight (lbs) 141 lb 15.6 oz 141 lb 14.4 oz 139 lb 9.6 oz  Weight (kg) 64.4 kg 64.365 kg 63.322 kg      Telemetry Sinus rhythm converting to atrial fibrillation with rapid ventricular response- Personally Reviewed   Physical Exam  GEN: No acute distress.   Neck: Supple Cardiac: Irregular, tachycardic, 2/6 systolic murmur left sternal border. Respiratory: Diminished breath sounds and crackles at the bases bilaterally GI: Soft, nontender, non-distended  MS: trace edema Neuro:  Nonfocal  Psych: Normal affect   Labs High Sensitivity Troponin:   Recent Labs  Lab 05/27/24 1821 05/27/24 2113  TROPONINIHS 10 9     Chemistry Recent Labs  Lab 05/27/24 1821 05/28/24 0450  NA 135 137  K 3.6 3.3*  CL 99 96*  CO2 26 30  GLUCOSE 123* 115*  BUN 8 7*  CREATININE 0.54 0.56  CALCIUM  9.6 9.3  PROT  --  6.9  ALBUMIN  --  2.8*   AST  --  19  ALT  --  16  ALKPHOS  --  66  BILITOT  --  0.7  GFRNONAA >60 >60  ANIONGAP 10 11     Hematology Recent Labs  Lab 05/27/24 1821 05/28/24 0450  WBC 9.7 9.6  RBC 4.38 4.21  HGB 10.0* 9.7*  HCT 33.1* 31.0*  MCV 75.6* 73.6*  MCH 22.8* 23.0*  MCHC 30.2 31.3  RDW 18.7* 18.6*  PLT 343 326   Thyroid   Recent Labs  Lab 05/28/24 0450  TSH 1.176    BNP Recent Labs  Lab 05/27/24 1821  BNP 2,160.4*      Radiology  CT Angio Chest PE W and/or Wo Contrast Result Date: 05/27/2024 CLINICAL DATA:  Concern for pulmonary embolism.  Pancreatic cancer. EXAM: CT ANGIOGRAPHY CHEST WITH CONTRAST TECHNIQUE: Multidetector CT imaging of the chest was performed using the standard protocol during bolus administration of intravenous contrast. Multiplanar CT image reconstructions and MIPs were obtained to evaluate the vascular anatomy. RADIATION DOSE REDUCTION: This exam was performed according to the departmental dose-optimization program which includes automated exposure control, adjustment of the mA and/or kV according to patient size and/or use  of iterative reconstruction technique. CONTRAST:  75mL OMNIPAQUE  IOHEXOL  350 MG/ML SOLN COMPARISON:  Chest radiograph dated 05/27/2024. FINDINGS: Cardiovascular: There is mild cardiomegaly. No pericardial effusion. There is calcification of the mitral annulus. Aortic valve repair. There is mild atherosclerotic calcification of the thoracic aorta. No aneurysmal dilatation. No pulmonary artery embolus identified. Mediastinum/Nodes: There is soft tissue fullness of the hilum and mediastinum. Prevascular space lymph nodes measure 14 mm short axis. The esophagus is grossly unremarkable. Heterogeneously enlarged thyroid  gland in keeping with multinodular goiter. This has been evaluated on previous imaging. (ref: J Am Coll Radiol. 2015 Feb;12(2): 143-50).No mediastinal fluid collection. Right-sided Port-A-Cath with tip in the right atrium. Lungs/Pleura: Small  bilateral pleural effusions. There is diffuse interstitial and interlobular septal prominence consistent with edema. Scattered pulmonary nodularity may represent edema or superimposed pneumonia. There is a 1 cm nodule in the right lower lobe. There is no pneumothorax. The central airways are patent. Upper Abdomen: Pneumobilia. Musculoskeletal: Osteopenia with degenerative changes spine. No acute osseous pathology. Review of the MIP images confirms the above findings. IMPRESSION: 1. No CT evidence of pulmonary artery embolus. 2. Cardiomegaly with findings of CHF and small bilateral pleural effusions. 3. Scattered pulmonary nodularity may represent edema or superimposed pneumonia. 4. A 1 cm nodule in the right lower lobe. 5.  Aortic Atherosclerosis (ICD10-I70.0). Electronically Signed   By: Vanetta Chou M.D.   On: 05/27/2024 20:33   DG Chest 2 View Result Date: 05/27/2024 CLINICAL DATA:  Shortness of breath EXAM: CHEST - 2 VIEW COMPARISON:  02/15/2024 FINDINGS: Right Port-A-Cath remains in place, unchanged. Heart and mediastinal contours are within normal limits. Aortic atherosclerosis. Prior aortic valve repair. Bilateral pulmonary infiltrates noted, right greater than left. No effusions. No acute bony abnormality. IMPRESSION: Bilateral pulmonary infiltrates, right greater than left. This could reflect edema or infection. Electronically Signed   By: Franky Crease M.D.   On: 05/27/2024 19:15    Patient Profile   83 y.o. female with past medical history of metastatic pancreatic cancer, paroxysmal atrial fibrillation, HFpEF, status post TAVR in July 2019, recent presumed endocarditis treated medically, diabetes mellitus, hypertension, prior CVA being evaluated for PAF and acute on chronic HFpEF.  Most recent echocardiogram June 2025 showed normal LV function, moderate left ventricular hypertrophy, grade 1 diastolic dysfunction, severe left atrial enlargement, moderate right atrial enlargement, moderate mitral  regurgitation, moderate tricuspid regurgitation, status post TAVR with mild aortic insufficiency and severe aortic stenosis (mean gradient 57 mmHg).  CTA this admission shows no pulmonary embolus but there is note of CHF.  Assessment & Plan   1 paroxysmal atrial fibrillation-patient is back in atrial fibrillation this morning.  This is likely exacerbating her CHF.  Will begin IV amiodarone .  Continue metoprolol  and Xarelto .  2 acute on chronic HFpEF-symptomatically improving.  Will continue Lasix  at present dose.  Follow renal function closely.  3 status post TAVR-most recent echocardiogram showed normal LV function but severe aortic stenosis with mean gradient 57 mmHg and mild aortic insufficiency.  She is not a candidate for further valve intervention given metastatic pancreatic cancer.  4 metastatic pancreatic cancer-per oncology.  5 hypokalemia-supplement.  Given metastatic pancreatic cancer, severe prosthetic valve aortic stenosis (not a candidate for intervention), CHF and age her prognosis is extremely poor.  Would discuss further hospice care.  Will leave to primary service.  For questions or updates, please contact [facility] Please consult [email or web address] for contact info under     Signed, Redell Shallow, MD  05/28/2024, 8:18 AM

## 2024-05-28 NOTE — Care Management Obs Status (Signed)
 MEDICARE OBSERVATION STATUS NOTIFICATION   Patient Details  Name: Janet Williamson MRN: [id number] Date of Birth: August 27, 1941   Medicare Observation Status Notification Given:  Yes    Vonzell Arrie Sharps 05/28/2024, 9:52 AM

## 2024-05-28 NOTE — Progress Notes (Signed)
 Patient arrived to the floor alert and oriented. to staff, room and equipment. CHG bath given, cardiac monitoring initiated, CCMD notified. We'll continue to monitor.

## 2024-05-28 NOTE — Plan of Care (Signed)
  Problem: Education: Goal: Ability to describe self-care measures that may prevent or decrease complications (Diabetes Survival Skills Education) will improve Outcome: Progressing Goal: Individualized Educational Video(s) Outcome: Progressing   Problem: Clinical Measurements: Goal: Ability to maintain clinical measurements within normal limits will improve Outcome: Progressing Goal: Will remain free from infection Outcome: Progressing Goal: Diagnostic test results will improve Outcome: Progressing Goal: Respiratory complications will improve Outcome: Progressing Goal: Cardiovascular complication will be avoided Outcome: Progressing

## 2024-05-28 NOTE — Progress Notes (Signed)
 Heart Failure Navigator Progress Note  Assessed for Heart & Vascular TOC clinic readiness.  Patient does not meet criteria due to EF 65-70%, per MD note patient with history of Metastatic cancer. No HF TOC. .   Navigator will sign off at this time.   Stephane Haddock, BSN, Scientist, clinical (histocompatibility and immunogenetics) Only

## 2024-05-28 NOTE — Progress Notes (Addendum)
 PROGRESS NOTE    Janet Williamson  FMW:[id number] DOB: 09-18-1941 DOA: 05/27/2024 PCP: Leonel Cole, MD   Brief Narrative:  Janet Williamson is a 83 y.o. female with history of metastatic pancreatic cancer, chronic HFpEF, status post TAVR, atrial fibrillation, history of CVA, diabetes mellitus type 2 presents to the ER with complaints of shortness of breath, orthopnea, with palpitations but no chest pain. Found to be in Afib with RVR at intake - cardiology consulted, hospitalist called for admission.  Assessment & Plan:   Principal Problem:   Acute on chronic heart failure with preserved ejection fraction (HFpEF) (HCC) Active Problems:   S/P TAVR (transcatheter aortic valve replacement)   Malignant neoplasm of head of pancreas (HCC)   DMII (diabetes mellitus, type 2) (HCC)   Anemia   History of stroke   Severe aortic stenosis   Acute CHF (congestive heart failure) (HCC)   Atrial fibrillation with RVR (HCC)   Acute on chronic HFpEF exacerbation History of TAVR  Cardiology consulted - appreciate insight and recommenations Continue diuresis - follow I/O and UOP Prior TAVR noted (2019) EF 65-70% 03/2024 - no indication to repeat echo at this time unless specified by cardiology  A-fib with RVR  Provoke in the setting of HF exacerbation Currently in sinus rhythm, continue amiodarone , metoprolol  Continue Xarelto  TSH wnl  Hypertension uncontrolled  Continue metoprolol   Amlodipine  held given transient hypotension Continue IV Lasix .   Diabetes mellitus type 2 uncontrolled with hyperglycemia At baseline on metformin .  Last hemoglobin A1c was 7.1 On sliding scale coverage.  History of pancreatic cancer  Being followed by oncologist supportive care at this time.  Continue Creon .  Hypomagnesemia - Repleted Metastatic prostate cancer -continue outpatient follow-up, no current indication for further imaging or testing.  Limits patient's surgical options given above TAVR with severe  aortic stenosis  Unspecified chronic anemia -iron panel pending.  Presume iron deficiency of chronic disease, pending iron panel will supplement as appropriate.   DVT prophylaxis:  rivaroxaban  (XARELTO ) tablet 20 mg   Code Status:   Code Status: Limited: Do not attempt resuscitation (DNR) -DNR-LIMITED -Do Not Intubate/DNI   Family Communication: None present  Status is: Inpatient  Dispo: The patient is from: Home              Anticipated d/c is to: Home              Anticipated d/c date is: 24 to 48 hours              Patient currently not medically stable for discharge  Consultants:  Cardiology  Procedures:  None  Antimicrobials:  None  Subjective: No acute issues or events overnight, heart rate now controlled denies any further chest pain shortness of breath palpitations.  Objective: Vitals:   05/27/24 2226 05/27/24 2243 05/27/24 2309 05/28/24 0330  BP:   (!) 157/60 139/60  Pulse:   77 74  Resp:   18 20  Temp: 98.9 F (37.2 C)  98.9 F (37.2 C) 98.4 F (36.9 C)  TempSrc: Oral  Oral Oral  SpO2:  100% 100% 100%  Weight:      Height:        Intake/Output Summary (Last 24 hours) at 05/28/2024 0715 Last data filed at 05/27/2024 2145 Gross per 24 hour  Intake --  Output 750 ml  Net -750 ml   Filed Weights   05/27/24 1806  Weight: 64.4 kg    Examination:  General:  Pleasantly resting in bed, No  acute distress. HEENT:  Normocephalic atraumatic.  Sclerae nonicteric, noninjected.  Extraocular movements intact bilaterally. Neck:  Without mass or deformity.  Trachea is midline. Lungs:  Clear to auscultate bilaterally without rhonchi, wheeze, or rales. Heart: Notable 3/6 systolic murmur -PMI at left upper sternal border Abdomen:  Soft, nontender, nondistended.  Without guarding or rebound. Extremities: Without cyanosis, clubbing, edema, or obvious deformity. Skin:  Warm and dry, no erythema.   Data Reviewed: I have personally reviewed following labs and  imaging studies  CBC: Recent Labs  Lab 05/27/24 1821 05/28/24 0450  WBC 9.7 9.6  HGB 10.0* 9.7*  HCT 33.1* 31.0*  MCV 75.6* 73.6*  PLT 343 326   Basic Metabolic Panel: Recent Labs  Lab 05/27/24 1821 05/28/24 0450  NA 135 137  K 3.6 3.3*  CL 99 96*  CO2 26 30  GLUCOSE 123* 115*  BUN 8 7*  CREATININE 0.54 0.56  CALCIUM  9.6 9.3   GFR: Estimated Creatinine Clearance: 48.1 mL/min (by C-G formula based on SCr of 0.56 mg/dL). Liver Function Tests: Recent Labs  Lab 05/28/24 0450  AST 19  ALT 16  ALKPHOS 66  BILITOT 0.7  PROT 6.9  ALBUMIN 2.8*   No results for input(s): LIPASE, AMYLASE in the last 168 hours. No results for input(s): AMMONIA in the last 168 hours. Coagulation Profile: No results for input(s): INR, PROTIME in the last 168 hours. Cardiac Enzymes: No results for input(s): CKTOTAL, CKMB, CKMBINDEX, TROPONINI in the last 168 hours. BNP (last 3 results) No results for input(s): PROBNP in the last 8760 hours. HbA1C: No results for input(s): HGBA1C in the last 72 hours. CBG: No results for input(s): GLUCAP in the last 168 hours. Lipid Profile: No results for input(s): CHOL, HDL, LDLCALC, TRIG, CHOLHDL, LDLDIRECT in the last 72 hours. Thyroid  Function Tests: Recent Labs    05/28/24 0450  TSH 1.176   Anemia Panel: No results for input(s): VITAMINB12, FOLATE, FERRITIN, TIBC, IRON, RETICCTPCT in the last 72 hours. Sepsis Labs: No results for input(s): PROCALCITON, LATICACIDVEN in the last 168 hours.  No results found for this or any previous visit (from the past 240 hours).       Radiology Studies: CT Angio Chest PE W and/or Wo Contrast Result Date: 05/27/2024 CLINICAL DATA:  Concern for pulmonary embolism.  Pancreatic cancer. EXAM: CT ANGIOGRAPHY CHEST WITH CONTRAST TECHNIQUE: Multidetector CT imaging of the chest was performed using the standard protocol during bolus administration of intravenous  contrast. Multiplanar CT image reconstructions and MIPs were obtained to evaluate the vascular anatomy. RADIATION DOSE REDUCTION: This exam was performed according to the departmental dose-optimization program which includes automated exposure control, adjustment of the mA and/or kV according to patient size and/or use of iterative reconstruction technique. CONTRAST:  75mL OMNIPAQUE  IOHEXOL  350 MG/ML SOLN COMPARISON:  Chest radiograph dated 05/27/2024. FINDINGS: Cardiovascular: There is mild cardiomegaly. No pericardial effusion. There is calcification of the mitral annulus. Aortic valve repair. There is mild atherosclerotic calcification of the thoracic aorta. No aneurysmal dilatation. No pulmonary artery embolus identified. Mediastinum/Nodes: There is soft tissue fullness of the hilum and mediastinum. Prevascular space lymph nodes measure 14 mm short axis. The esophagus is grossly unremarkable. Heterogeneously enlarged thyroid  gland in keeping with multinodular goiter. This has been evaluated on previous imaging. (ref: J Am Coll Radiol. 2015 Feb;12(2): 143-50).No mediastinal fluid collection. Right-sided Port-A-Cath with tip in the right atrium. Lungs/Pleura: Small bilateral pleural effusions. There is diffuse interstitial and interlobular septal prominence consistent with edema. Scattered pulmonary nodularity  may represent edema or superimposed pneumonia. There is a 1 cm nodule in the right lower lobe. There is no pneumothorax. The central airways are patent. Upper Abdomen: Pneumobilia. Musculoskeletal: Osteopenia with degenerative changes spine. No acute osseous pathology. Review of the MIP images confirms the above findings. IMPRESSION: 1. No CT evidence of pulmonary artery embolus. 2. Cardiomegaly with findings of CHF and small bilateral pleural effusions. 3. Scattered pulmonary nodularity may represent edema or superimposed pneumonia. 4. A 1 cm nodule in the right lower lobe. 5.  Aortic Atherosclerosis  (ICD10-I70.0). Electronically Signed   By: Vanetta Chou M.D.   On: 05/27/2024 20:33   DG Chest 2 View Result Date: 05/27/2024 CLINICAL DATA:  Shortness of breath EXAM: CHEST - 2 VIEW COMPARISON:  02/15/2024 FINDINGS: Right Port-A-Cath remains in place, unchanged. Heart and mediastinal contours are within normal limits. Aortic atherosclerosis. Prior aortic valve repair. Bilateral pulmonary infiltrates noted, right greater than left. No effusions. No acute bony abnormality. IMPRESSION: Bilateral pulmonary infiltrates, right greater than left. This could reflect edema or infection. Electronically Signed   By: Franky Crease M.D.   On: 05/27/2024 19:15        Scheduled Meds:  amLODipine   5 mg Oral Daily   furosemide   40 mg Intravenous Q12H   insulin  aspart  0-9 Units Subcutaneous TID WC   lipase/protease/amylase  36,000 Units Oral TID AC   metoprolol  tartrate  25 mg Oral BID   pantoprazole   40 mg Oral Daily   rivaroxaban   20 mg Oral Daily   Continuous Infusions:   LOS: 0 days   Time spent:  Janet JAYSON Mont[place], DO [facility]  If 7PM-7AM, please contact night-coverage [email or web address]  05/28/2024, 7:15 AM

## 2024-05-29 ENCOUNTER — Telehealth: Payer: Self-pay | Admitting: Cardiology

## 2024-05-29 DIAGNOSIS — I5033 Acute on chronic diastolic (congestive) heart failure: Secondary | ICD-10-CM | POA: Diagnosis not present

## 2024-05-29 DIAGNOSIS — Z79899 Other long term (current) drug therapy: Secondary | ICD-10-CM

## 2024-05-29 LAB — BASIC METABOLIC PANEL WITH GFR
Anion gap: 9 (ref 5–15)
BUN: 10 mg/dL (ref 8–23)
CO2: 31 mmol/L (ref 22–32)
Calcium: 9.6 mg/dL (ref 8.9–10.3)
Chloride: 95 mmol/L — ABNORMAL LOW (ref 98–111)
Creatinine, Ser: 0.75 mg/dL (ref 0.44–1.00)
GFR, Estimated: >60 mL/min (ref >60)
Glucose, Bld: 125 mg/dL — ABNORMAL HIGH (ref 70–99)
Potassium: 3.3 mmol/L — ABNORMAL LOW (ref 3.5–5.1)
Sodium: 135 mmol/L (ref 135–145)

## 2024-05-29 LAB — GLUCOSE, CAPILLARY
Glucose-Capillary: 132 mg/dL — ABNORMAL HIGH (ref 70–99)
Glucose-Capillary: 122 mg/dL — ABNORMAL HIGH (ref 70–99)

## 2024-05-29 LAB — CBC
HCT: 31.4 % — ABNORMAL LOW (ref 36.0–46.0)
Hemoglobin: 9.8 g/dL — ABNORMAL LOW (ref 12.0–15.0)
MCH: 22.7 pg — ABNORMAL LOW (ref 26.0–34.0)
MCHC: 31.2 g/dL (ref 30.0–36.0)
MCV: 72.9 fL — ABNORMAL LOW (ref 80.0–100.0)
Platelets: 335 K/uL (ref 150–400)
RBC: 4.31 MIL/uL (ref 3.87–5.11)
RDW: 18.4 % — ABNORMAL HIGH (ref 11.5–15.5)
WBC: 8.1 K/uL (ref 4.0–10.5)
nRBC: 0 % (ref 0.0–0.2)

## 2024-05-29 MED ORDER — AMIODARONE HCL 400 MG PO TABS: ORAL_TABLET | ORAL | 0 refills | Status: AC

## 2024-05-29 MED ORDER — FUROSEMIDE 20 MG PO TABS: 40.0000 mg | ORAL_TABLET | Freq: Every day | ORAL | 0 refills | Status: DC

## 2024-05-29 MED ORDER — HEPARIN SOD (PORK) LOCK FLUSH 100 UNIT/ML IV SOLN
500.0000 [IU] | Freq: Once | INTRAVENOUS | Status: AC
  Administered 2024-05-29: 500 [IU] via INTRAVENOUS

## 2024-05-29 MED ORDER — AMIODARONE HCL 200 MG PO TABS
400.0000 mg | ORAL_TABLET | Freq: Two times a day (BID) | ORAL | Status: DC
  Administered 2024-05-29: 400 mg via ORAL
  Filled 2024-05-29: qty 2

## 2024-05-29 NOTE — Telephone Encounter (Signed)
 Once this patient is discharged.  Can you please be sure that they get a BMP 1 week after.  Thank you

## 2024-05-29 NOTE — Discharge Summary (Addendum)
 Physician Discharge Summary  Janet Williamson FMW:[id number] DOB: [birth year] DOA: 05/27/2024  PCP: Leonel Cole, MD  Admit date: 05/27/2024 Discharge date: 05/29/2024  Admitted From: Home Disposition: Home  Recommendations for Outpatient Follow-up:  Follow up with PCP in 1-2 weeks Recommend renal panel within 1 week to follow potassium and kidney function Follow-up with cardiology as scheduled  Home Health: None Equipment/Devices: None  Discharge Condition: Stable CODE STATUS: Full Diet recommendation: Low-salt low-fat low-carb diet  Brief/Interim Summary: Janet Williamson is a 83 y.o. female with history of metastatic pancreatic cancer, chronic HFpEF, status post TAVR, atrial fibrillation, history of CVA, diabetes mellitus type 2 presents to the ER with complaints of shortness of breath, orthopnea, with palpitations but no chest pain. Found to be in Afib with RVR at intake - cardiology consulted, hospitalist called for admission.  Patient admitted with A-fib RVR, symptomatic which improved quite quickly on amiodarone  drip, cardiology consulted recommending transition to p.o. amiodarone  and to continue home metoprolol .  Patient ambulating with no further episodes or issues, otherwise stable and agreeable for discharge home.  Follow-up with PCP and cardiology as scheduled previously.  Discharge Diagnoses:  Principal Problem:   Acute on chronic heart failure with preserved ejection fraction (HFpEF) (HCC) Active Problems:   S/P TAVR (transcatheter aortic valve replacement)   Malignant neoplasm of head of pancreas (HCC)   DMII (diabetes mellitus, type 2) (HCC)   Anemia   History of stroke   Severe aortic stenosis   Acute CHF (congestive heart failure) (HCC)   Atrial fibrillation with RVR (HCC)    Acute on chronic HFpEF exacerbation History of TAVR  Cardiology consulted - appreciate insight and recommenations Continue diuresis - follow I/O and UOP Prior TAVR noted (2019) EF  65-70% 03/2024 - no indication to repeat echo at this time  A-fib with RVR  Provoke in the setting of HF exacerbation Currently in sinus rhythm, continue amiodarone , metoprolol  Continue Xarelto  TSH wnl   Hypertension improving Continue metoprolol   Amlodipine  discontinued given prior hypotension/well-controlled blood pressure currently   Diabetes mellitus type 2 uncontrolled with hyperglycemia At baseline on metformin .  Last hemoglobin A1c was 7.1   History of pancreatic cancer  Being followed by oncologist supportive care at this time.  Continue Creon .   Hypomagnesemia - Repleted Metastatic prostate cancer -continue outpatient follow-up, no current indication for further imaging or testing.  Limits patient's surgical options given above TAVR with severe aortic stenosis   Iron deficiency anemia, likely chronic  -Confirmed with iron panel, recommend dietary changes with appropriate supplementation at discharge   Discharge Instructions  Discharge Instructions     (HEART FAILURE PATIENTS) Call MD:  Anytime you have any of the following symptoms: 1) 3 pound weight gain in 24 hours or 5 pounds in 1 week 2) shortness of breath, with or without a dry hacking cough 3) swelling in the hands, feet or stomach 4) if you have to sleep on extra pillows at night in order to breathe.   Complete by: As directed    Call MD for:  difficulty breathing, headache or visual disturbances   Complete by: As directed    Call MD for:  extreme fatigue   Complete by: As directed    Call MD for:  hives   Complete by: As directed    Call MD for:  persistant dizziness or light-headedness   Complete by: As directed    Call MD for:  persistant nausea and vomiting   Complete by: As directed  Call MD for:  severe uncontrolled pain   Complete by: As directed    Call MD for:  temperature >100.4   Complete by: As directed    Diet - low sodium heart healthy   Complete by: As directed    Discharge instructions    Complete by: As directed    Repeat labs within 1 week of discharge to follow potassium and renal function   Increase activity slowly   Complete by: As directed       Allergies as of 05/29/2024       Reactions   Ace Inhibitors Swelling, Other (See Comments)   Angioedema   Keflex [cephalexin] Swelling, Other (See Comments)   Lips became swollen   Rifadin [rifampin] Swelling, Other (See Comments)   Lips became swollen        Medication List     STOP taking these medications    amLODipine  5 MG tablet Commonly known as: NORVASC        TAKE these medications    acetaminophen  500 MG tablet Commonly known as: TYLENOL  Take 1,000 mg by mouth every 8 (eight) hours as needed for mild pain (pain score 1-3) (for headaches).   amiodarone  400 MG tablet Commonly known as: PACERONE  Take 1 tablet (400 mg total) by mouth 2 (two) times daily for 7 days, THEN 0.5 tablets (200 mg total) 2 (two) times daily. Start taking on: May 29, 2024   baclofen  10 MG tablet Commonly known as: LIORESAL  Take 1 tablet (10 mg total) by mouth 3 (three) times daily. What changed:  when to take this reasons to take this   Creon  36000-114000 units Cpep capsule Generic drug: lipase/protease/amylase TAKE TWO (2) CAPSULES BY MOUTH THREE TIMES A DAY WITH MEALS TAKE 1 CAPSULE BY MOUTH AS NEEDED WITH SNACK. MAX 10/DAY *NEW PRESCRIPTION REQUEST*   cyanocobalamin  500 MCG tablet Commonly known as: VITAMIN B12 Take 1,000 mcg by mouth in the morning. Take two tablets by mouth every morning.   furosemide  20 MG tablet Commonly known as: LASIX  Take 2 tablets (40 mg total) by mouth daily. Can take an additional tablet as needed for weight gain What changed: how much to take   lidocaine -prilocaine  cream Commonly known as: EMLA  APPLY A QUARTER SIZE AMOUNT OF CREAM TO PORT - A- CATH 45 MINUTES TO 1 HOUR PRIOR TO PORT-A-CATH BEING ACCESS. *NEW PRESCRIPTION REQUEST*   loperamide  2 MG capsule Commonly known as:  IMODIUM  Take 1 capsule (2 mg total) by mouth 4 (four) times daily as needed for diarrhea or loose stools.   metFORMIN  750 MG 24 hr tablet Commonly known as: GLUCOPHAGE -XR TAKE 2 TABLETS ONE TIME DAILY WITH BREAKFAST   metoprolol  tartrate 25 MG tablet Commonly known as: LOPRESSOR  Take 1 tablet (25 mg total) by mouth 2 (two) times daily.   pantoprazole  20 MG tablet Commonly known as: PROTONIX  TAKE 1 TABLET BY MOUTH DAILY *NEW PRESCRIPTION REQUEST*   rivaroxaban  20 MG Tabs tablet Commonly known as: XARELTO  Take 1 tablet (20 mg total) by mouth daily with supper. What changed: when to take this   SYSTANE OP Place 1 drop into both eyes 2 (two) times daily.   traMADol  50 MG tablet Commonly known as: ULTRAM  Take 0.5-1 tablets (25-50 mg total) by mouth every 6 (six) hours as needed.   Vitamin D3 125 MCG (5000 UT) Caps Take 5,000 Units by mouth daily.        Allergies  Allergen Reactions   Ace Inhibitors Swelling and Other (See Comments)  Angioedema    Keflex [Cephalexin] Swelling and Other (See Comments)    Lips became swollen   Rifadin [Rifampin] Swelling and Other (See Comments)    Lips became swollen    Consultations: Cardiology  Procedures/Studies: CT Angio Chest PE W and/or Wo Contrast Result Date: 05/27/2024 CLINICAL DATA:  Concern for pulmonary embolism.  Pancreatic cancer. EXAM: CT ANGIOGRAPHY CHEST WITH CONTRAST TECHNIQUE: Multidetector CT imaging of the chest was performed using the standard protocol during bolus administration of intravenous contrast. Multiplanar CT image reconstructions and MIPs were obtained to evaluate the vascular anatomy. RADIATION DOSE REDUCTION: This exam was performed according to the departmental dose-optimization program which includes automated exposure control, adjustment of the mA and/or kV according to patient size and/or use of iterative reconstruction technique. CONTRAST:  75mL OMNIPAQUE  IOHEXOL  350 MG/ML SOLN COMPARISON:  Chest  radiograph dated 05/27/2024. FINDINGS: Cardiovascular: There is mild cardiomegaly. No pericardial effusion. There is calcification of the mitral annulus. Aortic valve repair. There is mild atherosclerotic calcification of the thoracic aorta. No aneurysmal dilatation. No pulmonary artery embolus identified. Mediastinum/Nodes: There is soft tissue fullness of the hilum and mediastinum. Prevascular space lymph nodes measure 14 mm short axis. The esophagus is grossly unremarkable. Heterogeneously enlarged thyroid  gland in keeping with multinodular goiter. This has been evaluated on previous imaging. (ref: J Am Coll Radiol. 2015 Feb;12(2): 143-50).No mediastinal fluid collection. Right-sided Port-A-Cath with tip in the right atrium. Lungs/Pleura: Small bilateral pleural effusions. There is diffuse interstitial and interlobular septal prominence consistent with edema. Scattered pulmonary nodularity may represent edema or superimposed pneumonia. There is a 1 cm nodule in the right lower lobe. There is no pneumothorax. The central airways are patent. Upper Abdomen: Pneumobilia. Musculoskeletal: Osteopenia with degenerative changes spine. No acute osseous pathology. Review of the MIP images confirms the above findings. IMPRESSION: 1. No CT evidence of pulmonary artery embolus. 2. Cardiomegaly with findings of CHF and small bilateral pleural effusions. 3. Scattered pulmonary nodularity may represent edema or superimposed pneumonia. 4. A 1 cm nodule in the right lower lobe. 5.  Aortic Atherosclerosis (ICD10-I70.0). Electronically Signed   By: Vanetta Chou M.D.   On: 05/27/2024 20:33   DG Chest 2 View Result Date: 05/27/2024 CLINICAL DATA:  Shortness of breath EXAM: CHEST - 2 VIEW COMPARISON:  02/15/2024 FINDINGS: Right Port-A-Cath remains in place, unchanged. Heart and mediastinal contours are within normal limits. Aortic atherosclerosis. Prior aortic valve repair. Bilateral pulmonary infiltrates noted, right greater  than left. No effusions. No acute bony abnormality. IMPRESSION: Bilateral pulmonary infiltrates, right greater than left. This could reflect edema or infection. Electronically Signed   By: Franky Crease M.D.   On: 05/27/2024 19:15     Subjective: No acute issues or events overnight   Discharge Exam: Vitals:   05/29/24 0700 05/29/24 1126  BP: (!) 130/57 (!) 144/94  Pulse: 65 66  Resp: 17 18  Temp: 97.8 F (36.6 C) (!) 97.3 F (36.3 C)  SpO2: 98% 98%   Vitals:   05/29/24 0555 05/29/24 0600 05/29/24 0700 05/29/24 1126  BP:  (!) 154/57 (!) 130/57 (!) 144/94  Pulse: 73 66 65 66  Resp:   17 18  Temp:   97.8 F (36.6 C) (!) 97.3 F (36.3 C)  TempSrc:   Oral Oral  SpO2: 96% 97% 98% 98%  Weight: 61.7 kg     Height:        General: Pt is alert, awake, not in acute distress Cardiovascular: RRR, S1/S2 +, no rubs, no gallops Respiratory:  CTA bilaterally, no wheezing, no rhonchi Abdominal: Soft, NT, ND, bowel sounds + Extremities: no edema, no cyanosis    The results of significant diagnostics from this hospitalization (including imaging, microbiology, ancillary and laboratory) are listed below for reference.     Microbiology: No results found for this or any previous visit (from the past 240 hours).   Labs: BNP (last 3 results) Recent Labs    02/12/24 2144 05/27/24 1821  BNP 815.7* 2,160.4*   Basic Metabolic Panel: Recent Labs  Lab 05/27/24 1821 05/28/24 0450 05/29/24 0435  NA 135 137 135  K 3.6 3.3* 3.3*  CL 99 96* 95*  CO2 26 30 31   GLUCOSE 123* 115* 125*  BUN 8 7* 10  CREATININE 0.54 0.56 0.75  CALCIUM  9.6 9.3 9.6  MG  --  1.5*  --    Liver Function Tests: Recent Labs  Lab 05/28/24 0450  AST 19  ALT 16  ALKPHOS 66  BILITOT 0.7  PROT 6.9  ALBUMIN 2.8*   No results for input(s): LIPASE, AMYLASE in the last 168 hours. No results for input(s): AMMONIA in the last 168 hours. CBC: Recent Labs  Lab 05/27/24 1821 05/28/24 0450 05/29/24 0435   WBC 9.7 9.6 8.1  HGB 10.0* 9.7* 9.8*  HCT 33.1* 31.0* 31.4*  MCV 75.6* 73.6* 72.9*  PLT 343 326 335   Cardiac Enzymes: No results for input(s): CKTOTAL, CKMB, CKMBINDEX, TROPONINI in the last 168 hours. BNP: Invalid input(s): POCBNP CBG: Recent Labs  Lab 05/28/24 1122 05/28/24 1604 05/28/24 2102 05/29/24 0616 05/29/24 1125  GLUCAP 166* 142* 127* 132* 122*   D-Dimer No results for input(s): DDIMER in the last 72 hours. Hgb A1c No results for input(s): HGBA1C in the last 72 hours. Lipid Profile No results for input(s): CHOL, HDL, LDLCALC, TRIG, CHOLHDL, LDLDIRECT in the last 72 hours. Thyroid  function studies Recent Labs    05/28/24 0450  TSH 1.176   Anemia work up Recent Labs    05/28/24 1417  FERRITIN 59  TIBC 321  IRON 16*   Urinalysis    Component Value Date/Time   COLORURINE YELLOW 10/30/2018 0936   APPEARANCEUR Sl Cloudy (A) 10/30/2018 0936   LABSPEC 1.020 10/30/2018 0936   PHURINE 5.5 10/30/2018 0936   GLUCOSEU NEGATIVE 10/30/2018 0936   HGBUR NEGATIVE 10/30/2018 0936   BILIRUBINUR NEGATIVE 10/30/2018 0936   KETONESUR NEGATIVE 10/30/2018 0936   PROTEINUR NEGATIVE 09/30/2018 0013   UROBILINOGEN 0.2 10/30/2018 0936   NITRITE NEGATIVE 10/30/2018 0936   LEUKOCYTESUR NEGATIVE 10/30/2018 0936   Sepsis Labs Recent Labs  Lab 05/27/24 1821 05/28/24 0450 05/29/24 0435  WBC 9.7 9.6 8.1   Microbiology No results found for this or any previous visit (from the past 240 hours).   Time coordinating discharge: Over 30 minutes  SIGNED:   Elsie JAYSON [place], DO [facility] 05/29/2024, 3:06 PM Pager   If 7PM-7AM, please contact night-coverage [email or web address]

## 2024-05-29 NOTE — Progress Notes (Signed)
 Pt taken to exit via wheelchair. Pt got into a private nehicle with her family and left.

## 2024-05-29 NOTE — Progress Notes (Signed)
 Rounding Note   Patient Name: Janet Williamson Date of Encounter: 05/29/2024  [facility] Cardiologist: Oneil Parchment, MD   Subjective  Pt denies CP or dyspnea  Scheduled Meds:  Chlorhexidine  Gluconate Cloth  6 each Topical Daily   insulin  aspart  0-9 Units Subcutaneous TID WC   lipase/protease/amylase  36,000 Units Oral TID AC   metoprolol  tartrate  25 mg Oral BID   pantoprazole   40 mg Oral Daily   polyethylene glycol  17 g Oral BID   rivaroxaban   20 mg Oral Daily   sodium chloride  flush  10-40 mL Intracatheter Q12H   Continuous Infusions:  amiodarone  30 mg/hr (05/29/24 0451)   PRN Meds: acetaminophen , metoprolol  tartrate, simethicone , sodium chloride  flush, traMADol    Vital Signs  Vitals:   05/29/24 0500 05/29/24 0555 05/29/24 0600 05/29/24 0700  BP: (!) 155/63  (!) 154/57 (!) 130/57  Pulse: 72 73 66 65  Resp:    17  Temp:    97.8 F (36.6 C)  TempSrc:    Oral  SpO2: 97% 96% 97% 98%  Weight:  61.7 kg    Height:        Intake/Output Summary (Last 24 hours) at 05/29/2024 0859 Last data filed at 05/29/2024 0700 Gross per 24 hour  Intake 450 ml  Output 1800 ml  Net -1350 ml      05/29/2024    5:55 AM 05/27/2024    6:06 PM 04/28/2024    9:27 AM  Last 3 Weights  Weight (lbs) 136 lb 0.4 oz 141 lb 15.6 oz 141 lb 14.4 oz  Weight (kg) 61.7 kg 64.4 kg 64.365 kg      Telemetry Sinus rhythm - Personally Reviewed   Physical Exam  GEN: NAD Neck: Supple, no TM Cardiac: RRR, 2/6 systolic murmur left sternal border, no rub Respiratory: CTA GI: Soft, NT/ND MS: trace edema Neuro:  Grossly intact Psych: Normal affect   Labs High Sensitivity Troponin:   Recent Labs  Lab 05/27/24 1821 05/27/24 2113  TROPONINIHS 10 9     Chemistry Recent Labs  Lab 05/27/24 1821 05/28/24 0450 05/29/24 0435  NA 135 137 135  K 3.6 3.3* 3.3*  CL 99 96* 95*  CO2 26 30 31   GLUCOSE 123* 115* 125*  BUN 8 7* 10  CREATININE 0.54 0.56 0.75  CALCIUM  9.6 9.3 9.6  MG   --  1.5*  --   PROT  --  6.9  --   ALBUMIN  --  2.8*  --   AST  --  19  --   ALT  --  16  --   ALKPHOS  --  66  --   BILITOT  --  0.7  --   GFRNONAA >60 >60 >60  ANIONGAP 10 11 9      Hematology Recent Labs  Lab 05/27/24 1821 05/28/24 0450 05/29/24 0435  WBC 9.7 9.6 8.1  RBC 4.38 4.21 4.31  HGB 10.0* 9.7* 9.8*  HCT 33.1* 31.0* 31.4*  MCV 75.6* 73.6* 72.9*  MCH 22.8* 23.0* 22.7*  MCHC 30.2 31.3 31.2  RDW 18.7* 18.6* 18.4*  PLT 343 326 335   Thyroid   Recent Labs  Lab 05/28/24 0450  TSH 1.176    BNP Recent Labs  Lab 05/27/24 1821  BNP 2,160.4*      Radiology  CT Angio Chest PE W and/or Wo Contrast Result Date: 05/27/2024 CLINICAL DATA:  Concern for pulmonary embolism.  Pancreatic cancer. EXAM: CT ANGIOGRAPHY CHEST WITH CONTRAST TECHNIQUE: Multidetector  CT imaging of the chest was performed using the standard protocol during bolus administration of intravenous contrast. Multiplanar CT image reconstructions and MIPs were obtained to evaluate the vascular anatomy. RADIATION DOSE REDUCTION: This exam was performed according to the departmental dose-optimization program which includes automated exposure control, adjustment of the mA and/or kV according to patient size and/or use of iterative reconstruction technique. CONTRAST:  75mL OMNIPAQUE  IOHEXOL  350 MG/ML SOLN COMPARISON:  Chest radiograph dated 05/27/2024. FINDINGS: Cardiovascular: There is mild cardiomegaly. No pericardial effusion. There is calcification of the mitral annulus. Aortic valve repair. There is mild atherosclerotic calcification of the thoracic aorta. No aneurysmal dilatation. No pulmonary artery embolus identified. Mediastinum/Nodes: There is soft tissue fullness of the hilum and mediastinum. Prevascular space lymph nodes measure 14 mm short axis. The esophagus is grossly unremarkable. Heterogeneously enlarged thyroid  gland in keeping with multinodular goiter. This has been evaluated on previous imaging. (ref: J  Am Coll Radiol. 2015 Feb;12(2): 143-50).No mediastinal fluid collection. Right-sided Port-A-Cath with tip in the right atrium. Lungs/Pleura: Small bilateral pleural effusions. There is diffuse interstitial and interlobular septal prominence consistent with edema. Scattered pulmonary nodularity may represent edema or superimposed pneumonia. There is a 1 cm nodule in the right lower lobe. There is no pneumothorax. The central airways are patent. Upper Abdomen: Pneumobilia. Musculoskeletal: Osteopenia with degenerative changes spine. No acute osseous pathology. Review of the MIP images confirms the above findings. IMPRESSION: 1. No CT evidence of pulmonary artery embolus. 2. Cardiomegaly with findings of CHF and small bilateral pleural effusions. 3. Scattered pulmonary nodularity may represent edema or superimposed pneumonia. 4. A 1 cm nodule in the right lower lobe. 5.  Aortic Atherosclerosis (ICD10-I70.0). Electronically Signed   By: Vanetta Chou M.D.   On: 05/27/2024 20:33   DG Chest 2 View Result Date: 05/27/2024 CLINICAL DATA:  Shortness of breath EXAM: CHEST - 2 VIEW COMPARISON:  02/15/2024 FINDINGS: Right Port-A-Cath remains in place, unchanged. Heart and mediastinal contours are within normal limits. Aortic atherosclerosis. Prior aortic valve repair. Bilateral pulmonary infiltrates noted, right greater than left. No effusions. No acute bony abnormality. IMPRESSION: Bilateral pulmonary infiltrates, right greater than left. This could reflect edema or infection. Electronically Signed   By: Franky Crease M.D.   On: 05/27/2024 19:15    Patient Profile   83 y.o. female with past medical history of metastatic pancreatic cancer, paroxysmal atrial fibrillation, HFpEF, status post TAVR in July 2019, recent presumed endocarditis treated medically, diabetes mellitus, hypertension, prior CVA being evaluated for PAF and acute on chronic HFpEF.  Most recent echocardiogram June 2025 showed normal LV function,  moderate left ventricular hypertrophy, grade 1 diastolic dysfunction, severe left atrial enlargement, moderate right atrial enlargement, moderate mitral regurgitation, moderate tricuspid regurgitation, status post TAVR with mild aortic insufficiency and severe aortic stenosis (mean gradient 57 mmHg).  CTA this admission shows no pulmonary embolus but there is note of CHF.  Assessment & Plan   1 paroxysmal atrial fibrillation-patient has converted back to sinus rhythm.  Will change IV amiodarone  to 400 mg by mouth twice daily for 1 week then 200 mg daily thereafter.  Continue metoprolol  and Xarelto .    2 acute on chronic HFpEF-symptomatically improving.  Will change Lasix  to 40 mg by mouth daily at discharge.  Follow renal function.  3 status post TAVR-most recent echocardiogram showed normal LV function but severe aortic stenosis with mean gradient 57 mmHg and mild aortic insufficiency.  She is not a candidate for further valve intervention given metastatic pancreatic cancer.  4 metastatic pancreatic cancer-per oncology.  5 hypokalemia-supplement.  Given metastatic pancreatic cancer, severe prosthetic valve aortic stenosis (not a candidate for intervention), CHF and age her prognosis is extremely poor.  Would discuss further hospice care.  Will leave to primary service.  Patient can be discharged from a cardiac standpoint.  Plan as outlined above.  Will check potassium and renal function 1 week following discharge.  We will arrange follow-up with APP 4 weeks after discharge.  For questions or updates, please contact [facility] Please consult [email or web address] for contact info under     Signed, Redell Shallow, MD  05/29/2024, 8:59 AM

## 2024-05-29 NOTE — Progress Notes (Addendum)
 Patient expressed verbal understanding of discharge plan of care.  Family at bedside.   Upper right port site with bandaid CDI.  Tele removed.  Per Dr. Lue, pt to d/c with K+ 3.3.  Patient discharging now by Artist RN.

## 2024-05-29 NOTE — Plan of Care (Signed)
  Problem: Nutritional: Goal: Maintenance of adequate nutrition will improve Outcome: Progressing   Problem: Clinical Measurements: Goal: Diagnostic test results will improve Outcome: Progressing Goal: Respiratory complications will improve Outcome: Progressing Goal: Cardiovascular complication will be avoided Outcome: Progressing   Problem: Clinical Measurements: Goal: Respiratory complications will improve Outcome: Progressing   Problem: Clinical Measurements: Goal: Cardiovascular complication will be avoided Outcome: Progressing   Problem: Coping: Goal: Level of anxiety will decrease Outcome: Progressing   Problem: Elimination: Goal: Will not experience complications related to urinary retention Outcome: Progressing

## 2024-05-31 DIAGNOSIS — E1165 Type 2 diabetes mellitus with hyperglycemia: Secondary | ICD-10-CM | POA: Diagnosis not present

## 2024-05-31 DIAGNOSIS — E1169 Type 2 diabetes mellitus with other specified complication: Secondary | ICD-10-CM | POA: Diagnosis not present

## 2024-05-31 DIAGNOSIS — I10 Essential (primary) hypertension: Secondary | ICD-10-CM | POA: Diagnosis not present

## 2024-05-31 DIAGNOSIS — I350 Nonrheumatic aortic (valve) stenosis: Secondary | ICD-10-CM | POA: Diagnosis not present

## 2024-06-03 ENCOUNTER — Encounter: Payer: Self-pay | Admitting: Hematology

## 2024-06-07 DIAGNOSIS — I350 Nonrheumatic aortic (valve) stenosis: Secondary | ICD-10-CM | POA: Diagnosis not present

## 2024-06-07 DIAGNOSIS — E1169 Type 2 diabetes mellitus with other specified complication: Secondary | ICD-10-CM | POA: Diagnosis not present

## 2024-06-07 DIAGNOSIS — I10 Essential (primary) hypertension: Secondary | ICD-10-CM | POA: Diagnosis not present

## 2024-06-07 DIAGNOSIS — E1165 Type 2 diabetes mellitus with hyperglycemia: Secondary | ICD-10-CM | POA: Diagnosis not present

## 2024-06-07 DIAGNOSIS — I480 Paroxysmal atrial fibrillation: Secondary | ICD-10-CM | POA: Diagnosis not present

## 2024-06-07 DIAGNOSIS — M1990 Unspecified osteoarthritis, unspecified site: Secondary | ICD-10-CM | POA: Diagnosis not present

## 2024-06-08 NOTE — Assessment & Plan Note (Signed)
 T3N0 by EUS, questionable peritoneal carcinomatosis -She had an episode of abdominal pain, nausea, and dizziness at church and came to ED 08/18/2023 -CT AP done 10/08/2023 showing a stable solid right lower lobe pulmonary nodule from 2019 and an ill-defined hypodense lesion in the pancreatic head measuring 4.4 x 3.9 x 3.3 cm obstructing the pancreatic and CBD appearing to encase and occlude the SMV and portal confluence with soft tissue stranding extending along the mesenteric root with nodular foci peritoneal soft tissue nodularity anterior to the mass; overall concerning for primary pancreatic malignancy with peritoneal carcinomatosis.    -EUS 10/16/23 by Dr. Burnette showing a T3 N0 pancreatic head mass invading the SMA and ERCP with stenting by Dr. Rosalie, cytology confirming adenocarcinoma.  -Due to her age, the vascular invasion/abutment of the SMA, and possible peritoneal involvement, she is likely not a surgical candidate but Dr. Aron has reviewed her case and agreed to see the pt for surgical discussion. She was seen on 11/26/2023 and felt to be a poor candidate for surgery  -PET scan showed no definite evidence of metastatic disease.  The questionable peritoneal involvement on CT is felt to be local extension -plan to start chemo gemcitabine  alone, and may add Abraxane  from second cycle if tolerates well. She started chemo on 11/13/2023, she did not tolerate gem/Abraxane , and chemo changed back to gemcitabine  alone  -restaging CT 4/23 showed sable pancreatic mass but a new 1.1cm liver lesion which is suspicious for new met, which also appears to be suspicious on liver MRI in early May -Pt was hospitalized for acute endocarditis on 02/12/2024 and she will continue antibiotics for 6 weeks -chemo held and she switched to supportive care alone

## 2024-06-09 ENCOUNTER — Inpatient Hospital Stay: Attending: Nurse Practitioner

## 2024-06-09 ENCOUNTER — Encounter (HOSPITAL_BASED_OUTPATIENT_CLINIC_OR_DEPARTMENT_OTHER): Payer: Self-pay

## 2024-06-09 ENCOUNTER — Inpatient Hospital Stay (HOSPITAL_BASED_OUTPATIENT_CLINIC_OR_DEPARTMENT_OTHER): Admitting: Hematology

## 2024-06-09 VITALS — BP 138/56 | HR 80 | Temp 97.3°F | Resp 16 | Ht 63.0 in | Wt 141.2 lb

## 2024-06-09 DIAGNOSIS — C250 Malignant neoplasm of head of pancreas: Secondary | ICD-10-CM | POA: Diagnosis not present

## 2024-06-09 DIAGNOSIS — Z7901 Long term (current) use of anticoagulants: Secondary | ICD-10-CM | POA: Diagnosis not present

## 2024-06-09 DIAGNOSIS — E039 Hypothyroidism, unspecified: Secondary | ICD-10-CM | POA: Insufficient documentation

## 2024-06-09 DIAGNOSIS — Z79899 Other long term (current) drug therapy: Secondary | ICD-10-CM | POA: Diagnosis not present

## 2024-06-09 DIAGNOSIS — I10 Essential (primary) hypertension: Secondary | ICD-10-CM | POA: Diagnosis not present

## 2024-06-09 DIAGNOSIS — E119 Type 2 diabetes mellitus without complications: Secondary | ICD-10-CM | POA: Diagnosis not present

## 2024-06-09 DIAGNOSIS — Z7984 Long term (current) use of oral hypoglycemic drugs: Secondary | ICD-10-CM | POA: Insufficient documentation

## 2024-06-09 DIAGNOSIS — I480 Paroxysmal atrial fibrillation: Secondary | ICD-10-CM | POA: Diagnosis not present

## 2024-06-09 LAB — CMP (CANCER CENTER ONLY)
ALT: 8 U/L (ref 0–44)
AST: 13 U/L — ABNORMAL LOW (ref 15–41)
Albumin: 3.4 g/dL — ABNORMAL LOW (ref 3.5–5.0)
Alkaline Phosphatase: 61 U/L (ref 38–126)
Anion gap: 6 (ref 5–15)
BUN: 17 mg/dL (ref 8–23)
CO2: 33 mmol/L — ABNORMAL HIGH (ref 22–32)
Calcium: 9.5 mg/dL (ref 8.9–10.3)
Chloride: 99 mmol/L (ref 98–111)
Creatinine: 0.84 mg/dL (ref 0.44–1.00)
GFR, Estimated: >60 mL/min (ref >60)
Glucose, Bld: 222 mg/dL — ABNORMAL HIGH (ref 70–99)
Potassium: 3.6 mmol/L (ref 3.5–5.1)
Sodium: 138 mmol/L (ref 135–145)
Total Bilirubin: 0.3 mg/dL (ref 0.0–1.2)
Total Protein: 7.1 g/dL (ref 6.5–8.1)

## 2024-06-09 LAB — CBC WITH DIFFERENTIAL (CANCER CENTER ONLY)
Abs Immature Granulocytes: 0.01 K/uL (ref 0.00–0.07)
Basophils Absolute: 0.1 K/uL (ref 0.0–0.1)
Basophils Relative: 1 %
Eosinophils Absolute: 0.1 K/uL (ref 0.0–0.5)
Eosinophils Relative: 2 %
HCT: 31.1 % — ABNORMAL LOW (ref 36.0–46.0)
Hemoglobin: 9.4 g/dL — ABNORMAL LOW (ref 12.0–15.0)
Immature Granulocytes: 0 %
Lymphocytes Relative: 39 %
Lymphs Abs: 2.5 K/uL (ref 0.7–4.0)
MCH: 22.1 pg — ABNORMAL LOW (ref 26.0–34.0)
MCHC: 30.2 g/dL (ref 30.0–36.0)
MCV: 73 fL — ABNORMAL LOW (ref 80.0–100.0)
Monocytes Absolute: 0.4 K/uL (ref 0.1–1.0)
Monocytes Relative: 6 %
Neutro Abs: 3.5 K/uL (ref 1.7–7.7)
Neutrophils Relative %: 52 %
Platelet Count: 343 K/uL (ref 150–400)
RBC: 4.26 MIL/uL (ref 3.87–5.11)
RDW: 18.9 % — ABNORMAL HIGH (ref 11.5–15.5)
WBC Count: 6.5 K/uL (ref 4.0–10.5)
nRBC: 0 % (ref 0.0–0.2)

## 2024-06-09 NOTE — Progress Notes (Signed)
 [facility]   Telephone:(336) [phone] Fax:(336) [phone]   Clinic Follow up Note   Patient Care Team: Leonel Cole, MD as PCP - General (Family Medicine) Jeffrie Oneil BROCKS, MD as PCP - Cardiology (Cardiology) Lanny Callander, MD as Consulting Physician (Hematology) Burnette Fallow, MD as Consulting Physician (Gastroenterology) Rosalie Kitchens, MD as Consulting Physician (Gastroenterology) Pickenpack-Cousar, Fannie SAILOR, NP as Nurse Practitioner [facility])  Date of Service:  06/09/2024  CHIEF COMPLAINT: f/u of pancreatic cancer  CURRENT THERAPY:  Supportive care alone  Oncology History   Malignant neoplasm of head of pancreas (HCC) T3N0 by EUS, questionable peritoneal carcinomatosis -She had an episode of abdominal pain, nausea, and dizziness at church and came to ED 08/18/2023 -CT AP done 10/08/2023 showing a stable solid right lower lobe pulmonary nodule from 2019 and an ill-defined hypodense lesion in the pancreatic head measuring 4.4 x 3.9 x 3.3 cm obstructing the pancreatic and CBD appearing to encase and occlude the SMV and portal confluence with soft tissue stranding extending along the mesenteric root with nodular foci peritoneal soft tissue nodularity anterior to the mass; overall concerning for primary pancreatic malignancy with peritoneal carcinomatosis.    -EUS 10/16/23 by Dr. Burnette showing a T3 N0 pancreatic head mass invading the SMA and ERCP with stenting by Dr. Rosalie, cytology confirming adenocarcinoma.  -Due to her age, the vascular invasion/abutment of the SMA, and possible peritoneal involvement, she is likely not a surgical candidate but Dr. Aron has reviewed her case and agreed to see the pt for surgical discussion. She was seen on 11/26/2023 and felt to be a poor candidate for surgery  -PET scan showed no definite evidence of metastatic disease.  The questionable peritoneal involvement on CT is felt to be local extension -plan to start chemo  gemcitabine  alone, and may add Abraxane  from second cycle if tolerates well. She started chemo on 11/13/2023, she did not tolerate gem/Abraxane , and chemo changed back to gemcitabine  alone  -restaging CT 4/23 showed sable pancreatic mass but a new 1.1cm liver lesion which is suspicious for new met, which also appears to be suspicious on liver MRI in early May -Pt was hospitalized for acute endocarditis on 02/12/2024 and she will continue antibiotics for 6 weeks -chemo held and she switched to supportive care alone   Assessment & Plan Pancreatic cancer Pancreatic cancer is not curable and is expected to progress. Recent CT scan showed lung nodules, but no plan to repeat CT as she is not on treatment. The cancer will likely lead to more symptoms such as decreased appetite, weight loss, and pain. She prefers clinic visits over home care services. - Document DNR status in the chart - Monitor for symptoms and provide supportive care as needed - Refer to palliative care nurse practitioner if symptoms worsen  Atrial fibrillation Atrial fibrillation is managed with amiodarone . Recently hospitalized for a cardiac issue but currently improved. - Continue amiodarone  - Advise caution to avoid falls and cuts due to anticoagulation therapy  Decreased appetite Reports decreased appetite, consuming about two Ensure drinks per day. Weight is stable compared to six weeks ago. Emphasized the importance of high protein, high calorie intake to maintain weight and energy. - Increase intake of Ensure or similar nutritional supplements to 4-5 bottles per day if not eating much food - Take Creon  with meals and Ensure  Hypertension Blood pressure is normal today, but she reports elevated readings at home. On Lasix , which can cause dehydration, especially in cancer patients. -  Monitor blood pressure at home - Hold Lasix  if blood pressure is low or if symptoms of dehydration occur (dizziness, dry mouth, reduced  urination)  Type 2 diabetes mellitus Type 2 diabetes is managed with metformin . - Continue metformin    Plan - Chart reviewed, including her recent hospital admission, and her medication list. - Lab results are pending today - I completed advanced care planning today, patient agrees with DNR, I documented. - She will monitor her blood pressure, to see if she needs to hold her Lasix  if blood pressure low - I encouraged her to increase nutritional supplement, especially Ensure - Lab and follow-up in 8 weeks, or sooner if needed. She can also f/u with [facility] for symptom management. - She declined hospice.  SUMMARY OF ONCOLOGIC HISTORY: Oncology History  Malignant neoplasm of head of pancreas (HCC)  10/16/2023 Cancer Staging   Staging form: Exocrine Pancreas, AJCC 8th Edition - Clinical stage from 10/16/2023: Stage IIA (cT3, cN0, cM0) - Signed by Lanny Callander, MD on 11/20/2023 Total positive nodes: 0   10/24/2023 Initial Diagnosis   Malignant neoplasm of head of pancreas (HCC)   11/13/2023 -  Chemotherapy   Patient is on Treatment Plan : PANCREATIC Abraxane  D1,8,15 + Gemcitabine  D1,8,15 q28d     12/04/2023 Genetic Testing   Negative genetic testing on the CancerNext+RNA panel.  BRCA1  p.D695N (c.2083G>A)  VUS identified.  The report date is 12/03/2023.  The [facility] CancerNext+RNAinsight Panel includes sequencing, rearrangement analysis, and RNA analysis for the following 39 genes: APC, ATM, BAP1, BARD1, BMPR1A, BRCA1, BRCA2, BRIP1, CDH1, CDKN2A, CHEK2, FH, FLCN, MET, MLH1, MSH2, MSH6, MUTYH, NF1, NTHL1, PALB2, PMS2, PTEN, RAD51C, RAD51D, SMAD4, STK11, TP53, TSC1, TSC2, and VHL (sequencing and deletion/duplication); AXIN2, HOXB13, MBD4, MSH3, POLD1 and POLE (sequencing only); EPCAM and GREM1 (deletion/duplication only).       Discussed the use of AI scribe software for clinical note transcription with the patient, who gave verbal consent to proceed.  History of Present Illness Janet Williamson is  an 83 year old female with pancreatic cancer who presents for follow-up. She is accompanied by her granddaughter.  She experiences a significant decrease in appetite, consuming only a couple of spoons of food before losing interest, and supplements her diet with two bottles of Ensure daily. She takes Creon  with meals, including Ensure, to aid digestion. She is on Protonix , metformin , and B12 supplements. She has a history of a port infection related to chemotherapy, which she is no longer receiving, but she still has a port. She has a DNR in place and prefers to come to the clinic for follow-ups rather than having home care services.     All other systems were reviewed with the patient and are negative.  MEDICAL HISTORY:  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 Not seen on most recent TEE 2019.   Cardiomyopathy in other disease    Cataracts, bilateral    Diabetes mellitus    Type 2   Generalized osteoarthritis    GERD (gastroesophageal reflux disease)    HLD (hyperlipidemia)    Hypertension    Hypothyroidism    LBBB (left bundle branch block)    PAF (paroxysmal atrial fibrillation) (HCC)    a. s/p DCCV 06/2017, on Xarelto    Phlebitis    S/P TAVR (transcatheter aortic valve replacement) 04/15/2018   Edwards Sapien 3 THV (size  23 mm, model # 9600TFX, serial # [id number]) via the TF approach   Severe aortic stenosis    a. s/p TAVR 04/2018.   Stroke [facility]) 2008    SURGICAL HISTORY: Past Surgical History:  Procedure Laterality Date   ABDOMINAL HYSTERECTOMY     BILIARY BRUSHING  10/16/2023   Procedure: BILIARY BRUSHING;  Surgeon: Burnette Fallow, MD;  Location: [facility];  Service: Gastroenterology;;   BILIARY STENT PLACEMENT N/A 10/16/2023   Procedure: BILIARY STENT PLACEMENT;  Surgeon: Burnette Fallow, MD;  Location: [facility];  Service: Gastroenterology;  Laterality: N/A;    BIOPSY  05/19/2019   Procedure: BIOPSY;  Surgeon: Donnald Charleston, MD;  Location: [facility] END[facility]COPY;  Service: Endoscopy;;   BREAST BIOPSY Left    years ago at a doctor's office   COLONOSCOPY     COLONOSCOPY WITH PROPOFOL  N/A 10/02/2018   Procedure: COLONOSCOPY WITH PROPOFOL ;  Surgeon: Burnette Fallow, MD;  Location: [facility] ENDOSCOPY;  Service: Endoscopy;  Laterality: N/A;   ENTEROSCOPY N/A 05/19/2019   Procedure: ENTEROSCOPY;  Surgeon: Donnald Charleston, MD;  [facility];  Service: Endoscopy;  Laterality: N/A;   ERCP N/A 10/16/2023   Procedure: ENDOSCOPIC RETROGRADE CHOLANGIOPANCREATOGRAPHY (ERCP);  Surgeon: Burnette Fallow, MD;  Location: [facility];  Service: Gastroenterology;  Laterality: N/A;   ESOPHAGOGASTRODUODENOSCOPY (EGD) WITH PROPOFOL  N/A 10/02/2018   Procedure: ESOPHAGOGASTRODUODENOSCOPY (EGD) WITH PROPOFOL ;  Surgeon: Burnette Fallow, MD;  Location: [facility] ENDOSCOPY;  Service: Endoscopy;  Laterality: N/A;   ESOPHAGOGASTRODUODENOSCOPY (EGD) WITH PROPOFOL  N/A 04/23/2019   Procedure: ESOPHAGOGASTRODUODENOSCOPY (EGD) WITH PROPOFOL ;  Surgeon: Lennard Lesta FALCON, MD;  Location: [facility] ENDOSCOPY;  Service: Endoscopy;  Laterality: N/A;   ESOPHAGOGASTRODUODENOSCOPY (EGD) WITH PROPOFOL  N/A 10/16/2023   Procedure: ESOPHAGOGASTRODUODENOSCOPY (EGD) WITH PROPOFOL ;  Surgeon: Burnette Fallow, MD;  Location: [facility];  Service: Gastroenterology;  Laterality: N/A;   EUS N/A 10/16/2023   Procedure: FULL UPPER ENDOSCOPIC ULTRASOUND (EUS) RADIAL;  Surgeon: Burnette Fallow, MD;  Location: [facility];  Service: Gastroenterology;  Laterality: N/A;   EYE SURGERY Bilateral    cataract removal   FINE NEEDLE ASPIRATION N/A 10/16/2023   Procedure: FINE NEEDLE ASPIRATION (FNA) LINEAR;  Surgeon: Burnette Fallow, MD;  Location: [facility];  Service: Gastroenterology;  Laterality: N/A;   GIVENS CAPSULE STUDY N/A 10/02/2018   Procedure: GIVENS CAPSULE STUDY;  Surgeon: Burnette Fallow, MD;  Location: [facility] ENDOSCOPY;  Service:  Endoscopy;  Laterality: N/A;   IR IMAGING GUIDED PORT INSERTION  01/10/2024   RIGHT/LEFT HEART CATH AND CORONARY ANGIOGRAPHY N/A 03/06/2018   Procedure: RIGHT/LEFT HEART CATH AND CORONARY ANGIOGRAPHY;  Surgeon: Claudene Victory ORN, MD;  Location: [facility];  Service: Cardiovascular;  Laterality: N/A;   SMALL BOWEL ENTEROSCOPY  05/19/2019   SPHINCTEROTOMY  10/16/2023   Procedure: SPHINCTEROTOMY;  Surgeon: Burnette Fallow, MD;  Location: [facility];  Service: Gastroenterology;;   TEE WITHOUT CARDIOVERSION N/A 04/01/2018   Procedure: TRANSESOPHAGEAL ECHOCARDIOGRAM (TEE);  Surgeon: Jeffrie Oneil BROCKS, MD;  Location: [facility] ENDOSCOPY;  Service: Cardiovascular;  Laterality: N/A;   TEE WITHOUT CARDIOVERSION N/A 04/15/2018   Procedure: TRANSESOPHAGEAL ECHOCARDIOGRAM (TEE);  Surgeon: Wonda Sharper, MD;  Location: [facility] OR;  Service: Open Heart Surgery;  Laterality: N/A;   TOTAL KNEE ARTHROPLASTY Right 04/14/2013   Dr Addie   TOTAL KNEE ARTHROPLASTY Right 04/14/2013   Procedure: TOTAL KNEE ARTHROPLASTY;  Surgeon: Cordella Glendia Addie, MD;  Location: [facility] OR;  Service: Orthopedics;  Laterality: Right;   TRANSCATHETER AORTIC VALVE REPLACEMENT, TRANSFEMORAL  04/15/2018   TRANSCATHETER AORTIC VALVE REPLACEMENT, TRANSFEMORAL Bilateral 04/15/2018  Procedure: TRANSCATHETER AORTIC VALVE REPLACEMENT, TRANSFEMORAL;  Surgeon: Wonda Sharper, MD;  Location[facility];  Service: Open Heart Surgery;  Laterality: Bilateral;    I have reviewed the social history and family history with the patient and they are unchanged from previous note.  ALLERGIES:  is allergic to ace inhibitors, keflex [cephalexin], and rifadin [rifampin].  MEDICATIONS:  Current Outpatient Medications  Medication Sig Dispense Refill   acetaminophen  (TYLENOL ) 500 MG tablet Take 1,000 mg by mouth every 8 (eight) hours as needed for mild pain (pain score 1-3) (for headaches).     amiodarone  (PACERONE ) 400 MG tablet Take 1 tablet (400 mg total) by mouth 2 (two) times  daily for 7 days, THEN 0.5 tablets (200 mg total) 2 (two) times daily. 44 tablet 0   baclofen  (LIORESAL ) 10 MG tablet Take 1 tablet (10 mg total) by mouth 3 (three) times daily. (Patient taking differently: Take 10 mg by mouth as needed for muscle spasms.) 30 each 0   Cholecalciferol  (VITAMIN D3) 5000 units CAPS Take 5,000 Units by mouth daily.     CREON  36000-114000 units CPEP capsule TAKE TWO (2) CAPSULES BY MOUTH THREE TIMES A DAY WITH MEALS TAKE 1 CAPSULE BY MOUTH AS NEEDED WITH SNACK. MAX 10/DAY *NEW PRESCRIPTION REQUEST* 900 capsule 11   cyanocobalamin  (VITAMIN B12) 500 MCG tablet Take 1,000 mcg by mouth in the morning. Take two tablets by mouth every morning.     furosemide  (LASIX ) 20 MG tablet Take 2 tablets (40 mg total) by mouth daily. Can take an additional tablet as needed for weight gain 60 tablet 0   lidocaine -prilocaine  (EMLA ) cream APPLY A QUARTER SIZE AMOUNT OF CREAM TO PORT - A- CATH 45 MINUTES TO 1 HOUR PRIOR TO PORT-A-CATH BEING ACCESS. *NEW PRESCRIPTION REQUEST* 90 g 11   loperamide  (IMODIUM ) 2 MG capsule Take 1 capsule (2 mg total) by mouth 4 (four) times daily as needed for diarrhea or loose stools. 12 capsule 0   metFORMIN  (GLUCOPHAGE -XR) 750 MG 24 hr tablet TAKE 2 TABLETS ONE TIME DAILY WITH BREAKFAST 180 tablet 2   metoprolol  tartrate (LOPRESSOR ) 25 MG tablet Take 1 tablet (25 mg total) by mouth 2 (two) times daily. 60 tablet 3   pantoprazole  (PROTONIX ) 20 MG tablet TAKE 1 TABLET BY MOUTH DAILY *NEW PRESCRIPTION REQUEST* 90 tablet 11   Polyethyl Glycol-Propyl Glycol (SYSTANE OP) Place 1 drop into both eyes 2 (two) times daily.      rivaroxaban  (XARELTO ) 20 MG TABS tablet Take 1 tablet (20 mg total) by mouth daily with supper. (Patient taking differently: Take 20 mg by mouth daily with breakfast.) 30 tablet 0   traMADol  (ULTRAM ) 50 MG tablet Take 0.5-1 tablets (25-50 mg total) by mouth every 6 (six) hours as needed. 30 tablet 0   No current facility-administered medications  for this visit.   Facility-Administered Medications Ordered in Other Visits  Medication Dose Route Frequency Provider Last Rate Last Admin   0.9 %  sodium chloride  infusion   Intravenous Continuous Lanny Callander, MD   Stopped at 12/11/23 1305   0.9 %  sodium chloride  infusion   Intravenous Continuous Lanny Callander, MD   Stopped at 12/11/23 1304    PHYSICAL EXAMINATION: ECOG PERFORMANCE STATUS: 2 - Symptomatic, <50% confined to bed  Vitals:   06/09/24 0931  BP: (!) 138/56  Pulse: 80  Resp: 16  Temp: (!) 97.3 F (36.3 C)  SpO2: 96%   Wt Readings from Last 3 Encounters:  06/09/24 141 lb 3.2 oz (  64 kg)  05/29/24 136 lb 0.4 oz (61.7 kg)  04/28/24 141 lb 14.4 oz (64.4 kg)     GENERAL:alert, no distress and comfortable SKIN: skin color, texture, turgor are normal, no rashes or significant lesions EYES: normal, Conjunctiva are pink and non-injected, sclera clear Musculoskeletal:no cyanosis of digits and no clubbing  NEURO: alert & oriented x 3 with fluent speech, no focal motor/sensory deficits  Physical Exam   LABORATORY DATA:  I have reviewed the data as listed    Latest Ref Rng & Units 05/29/2024    4:35 AM 05/28/2024    4:50 AM 05/27/2024    6:21 PM  CBC  WBC 4.0 - 10.5 K/uL 8.1  9.6  9.7   Hemoglobin 12.0 - 15.0 g/dL 9.8  9.7  89.9   Hematocrit 36.0 - 46.0 % 31.4  31.0  33.1   Platelets 150 - 400 K/uL 335  326  343         Latest Ref Rng & Units 05/29/2024    4:35 AM 05/28/2024    4:50 AM 05/27/2024    6:21 PM  CMP  Glucose 70 - 99 mg/dL 874  884  876   BUN 8 - 23 mg/dL 10  7  8    Creatinine 0.44 - 1.00 mg/dL 9.24  9.43  9.45   Sodium 135 - 145 mmol/L 135  137  135   Potassium 3.5 - 5.1 mmol/L 3.3  3.3  3.6   Chloride 98 - 111 mmol/L 95  96  99   CO2 22 - 32 mmol/L 31  30  26    Calcium  8.9 - 10.3 mg/dL 9.6  9.3  9.6   Total Protein 6.5 - 8.1 g/dL  6.9    Total Bilirubin 0.0 - 1.2 mg/dL  0.7    Alkaline Phos 38 - 126 U/L  66    AST 15 - 41 U/L  19    ALT 0 - 44 U/L   16        RADIOGRAPHIC STUDIES: I have personally reviewed the radiological images as listed and agreed with the findings in the report. No results found.    Orders Placed This Encounter  Procedures   Do not attempt resuscitation (DNR)    If patient has no pulse and is not breathing:   Do Not Attempt Resuscitation    If patient has a pulse and/or is breathing: Medical Treatment Goals:   LIMITED ADDITIONAL INTERVENTIONS: Use medication/IV fluids and cardiac monitoring as indicated; Do not use intubation or mechanical ventilation (DNI), also provide comfort medications.  Transfer to Progressive/Stepdown as indicated, avoid Intensive Care.    Consent::   Discussion documented in EHR or advanced directives reviewed   All questions were answered. The patient knows to call the clinic with any problems, questions or concerns. No barriers to learning was detected. The total time spent in the appointment was 40 minutes, including review of chart and various tests results, discussions about plan of care and coordination of care plan     Onita Mattock, MD 06/09/2024

## 2024-06-10 LAB — CANCER ANTIGEN 19-9: CA 19-9: <2 U/mL (ref 0–35)

## 2024-06-10 NOTE — Progress Notes (Signed)
 " Cardiology Office Note    Patient Name: Janet Williamson Date of Encounter: 06/10/2024  Primary Care Provider:  Leonel Cole, MD Primary Cardiologist:  Oneil Parchment, MD Primary Electrophysiologist: None   Past Medical History    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 Not seen on most recent TEE 2019.   Cardiomyopathy in other disease    Cataracts, bilateral    Diabetes mellitus    Type 2   Generalized osteoarthritis    GERD (gastroesophageal reflux disease)    HLD (hyperlipidemia)    Hypertension    Hypothyroidism    LBBB (left bundle branch block)    PAF (paroxysmal atrial fibrillation) (HCC)    a. s/p DCCV 06/2017, on Xarelto    Phlebitis    S/P TAVR (transcatheter aortic valve replacement) 04/15/2018   Edwards Sapien 3 THV (size 23 mm, model # 9600TFX, serial # [id number]) via the TF approach   Severe aortic stenosis    a. s/p TAVR 04/2018.   Stroke [facility]) 2008    History of Present Illness  Janet Williamson is a 83 y.o. female with a PMH of embolic CVA 2008, nonrheumatic AS s/p TAVR 2019, mitral valve mass, PAF (on Xarelto ), LBBB, HTN, HLD, hypothyroidism, GERD, DM type II, pancreatic CA on chemotherapy who presents today for hospital follow-up.  Janet Williamson was last seen on 03/06/2024 for hospital follow-up of presumed endocarditis.  During her visit and her fluid level was stable with no edema or dyspnea.  She was also found to have stable blood pressures and was continued on current medications.. She was deemed not a candidate for any intervention and was treated with 6 weeks of antibiotics. She was deemed not a candidate for intervention given her pancreatitic cancer. She saw Oncology during an outpatient appointment on 04/28/24, and chemotherapy was discontinued at that time with a shift to focus on supportive care.   She was admitted on 05/27/2024 for decompensated CHF  and AF with RVR.  She reports that during episodes of AF she has extra fluid and diaphoresis with chest pain.  CTA was completed showing no evidence of embolism and small bilateral pleural effusions.  She self converted to sinus rhythm in the ED and felt improvement.  She received IV Lasix  x 1 with good urinary response.  She was placed on amiodarone  drip and Xarelto .  She was transition to p.o. amiodarone  40 mg twice daily x 1 week and then 200 mg daily and also Lasix  changed to 40 mg daily.  Janet Williamson presents today with her daughter for posthospital follow-up. She was recently hospitalized due to an exacerbation of heart failure and rapid atrial fibrillation. During her hospital stay, she spontaneously converted to sinus rhythm after treatment with Lasix  and initiation of amiodarone . She is currently taking amiodarone  200 mg twice daily, reduced from 400 mg, and feels better with this regimen. She is also on Lasix  20 mg twice daily and reports good urinary output. Her weight has remained stable, and she is not currently on any potassium supplements. Recent labs showed a creatinine level of 3.6. No chest pain, tightness, or changes in breathing since her hospital discharge. Her blood pressure at home has been running slightly high, around 150/60, compared to the 130s she experienced prior to hospitalization. She is currently taking metoprolol  twice daily. Patient denies chest pain, palpitations, dyspnea, PND, orthopnea,  nausea, vomiting, dizziness, syncope, edema, weight gain, or early satiety.  Discussed the use of AI scribe software for clinical note transcription with the patient, who gave verbal consent to proceed.  History of Present Illness    Review of Systems  Please see the history of present illness.    All other systems reviewed and are otherwise negative except as noted above.  Physical Exam    Wt Readings from Last 3 Encounters:  06/09/24 141 lb 3.2 oz (64 kg)  05/29/24 136 lb 0.4  oz (61.7 kg)  04/28/24 141 lb 14.4 oz (64.4 kg)   CD:Uyzmz were no vitals filed for this visit.,There is no height or weight on file to calculate BMI. GEN: Well nourished, well developed in no acute distress Neck: No JVD; No carotid bruits Pulmonary: Clear to auscultation without rales, diminished in the bases Cardiovascular: Normal rate. Regular rhythm. Normal S1. Normal S2.   Murmurs: 4/6 systolic murmur ABDOMEN: Soft, non-tender, non-distended EXTREMITIES:  No edema; No deformity   EKG/LABS/ Recent Cardiac Studies   ECG personally reviewed by me today -sinus rhythm with rate of 56 bpm and LBBB with corrected QT of 456 no acute changes.  Risk Assessment/Calculations:    CHA2DS2-VASc Score = 9   This indicates a 12.2% annual risk of stroke. The patient's score is based upon: CHF History: 1 HTN History: 1 Diabetes History: 1 Stroke History: 2 Vascular Disease History: 1 Age Score: 2 Gender Score: 1         Lab Results  Component Value Date   WBC 6.5 06/09/2024   HGB 9.4 (L) 06/09/2024   HCT 31.1 (L) 06/09/2024   MCV 73.0 (L) 06/09/2024   PLT 343 06/09/2024   Lab Results  Component Value Date   CREATININE 0.84 06/09/2024   BUN 17 06/09/2024   NA 138 06/09/2024   K 3.6 06/09/2024   CL 99 06/09/2024   CO2 33 (H) 06/09/2024   Lab Results  Component Value Date   CHOL 137 01/07/2024   HDL 42 (L) 01/07/2024   LDLCALC 78 01/07/2024   TRIG 91 01/07/2024   CHOLHDL 3.3 01/07/2024    Lab Results  Component Value Date   HGBA1C 7.1 (A) 01/13/2024   Assessment & Plan   Assessment & Plan Chronic diastolic heart failure with paroxysmal atrial fibrillation and nonrheumatic aortic valve insufficiency Experienced heart failure exacerbation with rapid AFib, converted to sinus rhythm with Lasix  and amiodarone . Currently stable on amiodarone , creatinine stable, potassium improved. Heart rate controlled, stable weight, good urinary output. - Continue amiodarone  200 mg twice  daily. - Adjust Lasix  to 20 mg once daily with an additional 20 mg as needed based on weight gain, dyspnea, and edema. - Encourage dietary intake of potassium-rich foods such as bananas, oranges, and apricots. - Monitor potassium levels during the next lab check in October. - Continue Xarelto  as prescribed.  Hypertension Blood pressure elevated at 152/54, higher than usual range. On metoprolol  twice daily. Lasix  adjustment may aid in management. - Monitor blood pressure at home and log readings. - If blood pressure remains in the 150s, contact the office for potential medication adjustments. - Provide a blood pressure log for home monitoring.  1.HFpEF: -Heart failure exacerbation secondary to AF with RVR and treated with Lasix  with conversion to sinus rhythm and improved CHF status - Today patient is euvolemic on exam with no complaints of shortness of breath - Adjust Lasix  to 20 mg once daily with an additional 20 mg as needed  based on weight gain, dyspnea, and edema. - Encourage dietary intake of potassium-rich foods such as bananas, oranges, and apricots. - Monitor potassium levels during the next lab check in October. -Low sodium diet, fluid restriction <2L, and daily weights encouraged. Educated to contact our office for weight gain of 2 lbs overnight or 5 lbs in one week.   2.Paroxysmal AF: -Recent AF with RVR and initiated on amiodarone  200 mg daily - Patient is rate controlled at 56 bpm - Patient reports no bleeding on Xarelto  -Continue amiodarone  200 mg twice daily - Continue Xarelto  20 mg daily and metoprolol  25 mg twice daily  3.Nonrheumatic AVR: -s/p TAVR 2019 with severe aortic stenosis and mean gradient of 57 mmHg not a candidate for invasive procedures due to metastatic cancer -Vegetation on aortic valve noted. Cardiologist opted for monitoring due to lack of symptoms. Potential for progression to stenosis if symptoms develop. - Continue current management without  intervention unless symptoms develop.  4. Malignant neoplasm of the pancreas: - Chemotherapy was discontinued recently and currently followed by palliative care  5. History of CVA: -s/p embolic CVA in 2008 due to mitral valve mass of 1.3 cm  - Continue good BP control and Xarelto  20 mg daily  6.  Primary hypertension: -Blood pressure elevated at 152/54, higher than usual range. On metoprolol  twice daily. Lasix  adjustment may aid in management. - Monitor blood pressure at home and log readings. - If blood pressure remains in the 150s, contact the office for potential medication adjustments. - Provide a blood pressure log for home monitoring.  Disposition: Follow-up with Oneil Parchment, MD or APP in 6 months   Signed, Wyn Raddle, Jackee Shove, NP 06/10/2024, 12:26 PM [facility] "

## 2024-06-11 ENCOUNTER — Ambulatory Visit: Attending: Nurse Practitioner | Admitting: Nurse Practitioner

## 2024-06-11 ENCOUNTER — Encounter: Payer: Self-pay | Admitting: Nurse Practitioner

## 2024-06-11 VITALS — BP 154/60 | HR 56 | Ht 63.0 in | Wt 142.0 lb

## 2024-06-11 DIAGNOSIS — I351 Nonrheumatic aortic (valve) insufficiency: Secondary | ICD-10-CM

## 2024-06-11 DIAGNOSIS — I4891 Unspecified atrial fibrillation: Secondary | ICD-10-CM | POA: Diagnosis not present

## 2024-06-11 DIAGNOSIS — I5032 Chronic diastolic (congestive) heart failure: Secondary | ICD-10-CM | POA: Diagnosis not present

## 2024-06-11 DIAGNOSIS — E538 Deficiency of other specified B group vitamins: Secondary | ICD-10-CM | POA: Diagnosis not present

## 2024-06-11 DIAGNOSIS — C250 Malignant neoplasm of head of pancreas: Secondary | ICD-10-CM | POA: Diagnosis not present

## 2024-06-11 DIAGNOSIS — I639 Cerebral infarction, unspecified: Secondary | ICD-10-CM

## 2024-06-11 DIAGNOSIS — C259 Malignant neoplasm of pancreas, unspecified: Secondary | ICD-10-CM | POA: Diagnosis not present

## 2024-06-11 DIAGNOSIS — I480 Paroxysmal atrial fibrillation: Secondary | ICD-10-CM

## 2024-06-11 DIAGNOSIS — G629 Polyneuropathy, unspecified: Secondary | ICD-10-CM | POA: Diagnosis not present

## 2024-06-11 DIAGNOSIS — I509 Heart failure, unspecified: Secondary | ICD-10-CM | POA: Diagnosis not present

## 2024-06-11 MED ORDER — FUROSEMIDE 20 MG PO TABS: 20.0000 mg | ORAL_TABLET | Freq: Every day | ORAL | 1 refills | Status: DC

## 2024-06-11 NOTE — Patient Instructions (Addendum)
 Medication Instructions:  CHANGE Lasix  to 20 mg take 1 tablet once a day and can take an additional tablet as needed for weight gain of 2 lbs in a day or 5 lbs in a week  *If you need a refill on your cardiac medications before your next appointment, please call your pharmacy*  Lab Work: None ordered If you have labs (blood work) drawn today and your tests are completely normal, you will receive your results only by: MyChart Message (if you have MyChart) OR A paper copy in the mail If you have any lab test that is abnormal or we need to change your treatment, we will call you to review the results.  Testing/Procedures: None ordered  Follow-Up: At [facility], you and your health needs are our priority.  As part of our continuing mission to provide you with exceptional heart care, our providers are all part of one team.  This team includes your primary Cardiologist (physician) and Advanced Practice Providers or APPs (Physician Assistants and Nurse Practitioners) who all work together to provide you with the care you need, when you need it.  Your next appointment:   6 month(s)  Provider:   Oneil Parchment, MD    We recommend signing up for the patient portal called MyChart.  Sign up information is provided on this After Visit Summary.  MyChart is used to connect with patients for Virtual Visits (Telemedicine).  Patients are able to view lab/test results, encounter notes, upcoming appointments, etc.  Non-urgent messages can be sent to your provider as well.   To learn more about what you can do with MyChart, go to [email or web address].   Other Instructions PLEASE CONTINUE TO CHECK YOUR BLOOD PRESSURE DAILY AND IF YOUR TOP NUMBER IS CONSISTENTLY OVER [phone] PLEASE CONTACT THE OFFICE

## 2024-06-15 ENCOUNTER — Encounter: Payer: Self-pay | Admitting: Hematology

## 2024-06-15 ENCOUNTER — Ambulatory Visit (INDEPENDENT_AMBULATORY_CARE_PROVIDER_SITE_OTHER): Admitting: Podiatry

## 2024-06-15 VITALS — Ht 63.0 in | Wt 142.0 lb

## 2024-06-15 DIAGNOSIS — M79675 Pain in left toe(s): Secondary | ICD-10-CM

## 2024-06-15 DIAGNOSIS — B351 Tinea unguium: Secondary | ICD-10-CM | POA: Diagnosis not present

## 2024-06-15 DIAGNOSIS — M79674 Pain in right toe(s): Secondary | ICD-10-CM | POA: Diagnosis not present

## 2024-06-15 NOTE — Progress Notes (Signed)
   Chief Complaint  Patient presents with   Diabetes    Rm 7 Patient is here for diabetic foot care and nail trimming. Patient is concerned with the excessive thickening of the left hallux.    SUBJECTIVE Patient presents to office today complaining of elongated, thickened nails that cause pain while ambulating in shoes.  Patient is unable to trim their own nails. Patient is here for further evaluation and treatment.  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 Not seen on most recent TEE 2019.   Cardiomyopathy in other disease    Cataracts, bilateral    Diabetes mellitus    Type 2   Generalized osteoarthritis    GERD (gastroesophageal reflux disease)    HLD (hyperlipidemia)    Hypertension    Hypothyroidism    LBBB (left bundle branch block)    PAF (paroxysmal atrial fibrillation) (HCC)    a. s/p DCCV 06/2017, on Xarelto    Phlebitis    S/P TAVR (transcatheter aortic valve replacement) 04/15/2018   Edwards Sapien 3 THV (size 23 mm, model # 9600TFX, serial # [id number]) via the TF approach   Severe aortic stenosis    a. s/p TAVR 04/2018.   Stroke [facility]) 2008    Allergies  Allergen Reactions   Ace Inhibitors Swelling and Other (See Comments)    Angioedema    Keflex [Cephalexin] Swelling and Other (See Comments)    Lips became swollen   Rifadin [Rifampin] Swelling and Other (See Comments)    Lips became swollen     OBJECTIVE General Patient is awake, alert, and oriented x 3 and in no acute distress. Derm Skin is dry and supple bilateral. Negative open lesions or macerations. Remaining integument unremarkable. Nails are tender, long, thickened and dystrophic with subungual debris, consistent with onychomycosis, 1-5 bilateral. No signs of infection noted. Vasc  DP and PT pedal pulses palpable bilaterally. Temperature gradient within normal limits.  Neuro Epicritic and protective  threshold sensation grossly intact bilaterally.  Musculoskeletal Exam No symptomatic pedal deformities noted bilateral. Muscular strength within normal limits.  ASSESSMENT 1.  Pain due to onychomycosis of toenails both 2.  Encounter for diabetic foot exam PLAN OF CARE 1. Patient evaluated today.  2. Instructed to maintain good pedal hygiene and foot care.  3. Mechanical debridement of nails 1-5 bilaterally performed using a nail nipper. Filed with dremel without incident.  4.  Comprehensive diabetic foot exam performed today  5.  Return to clinic in 3 mos. routine footcare   Thresa EMERSON Sar, DPM [facility]  Dr. Thresa EMERSON Sar, DPM    [address] [place], [place] [address]                Office [phone]  Fax [phone]

## 2024-06-18 DIAGNOSIS — H5203 Hypermetropia, bilateral: Secondary | ICD-10-CM | POA: Diagnosis not present

## 2024-06-18 DIAGNOSIS — H40033 Anatomical narrow angle, bilateral: Secondary | ICD-10-CM | POA: Diagnosis not present

## 2024-06-26 ENCOUNTER — Encounter: Payer: Self-pay | Admitting: Endocrinology

## 2024-06-26 ENCOUNTER — Ambulatory Visit: Payer: Self-pay | Admitting: Endocrinology

## 2024-06-26 ENCOUNTER — Ambulatory Visit: Admitting: Endocrinology

## 2024-06-26 ENCOUNTER — Encounter: Payer: Self-pay | Admitting: Hematology

## 2024-06-26 VITALS — BP 158/62 | HR 68 | Resp 20 | Ht 63.0 in | Wt 142.6 lb

## 2024-06-26 DIAGNOSIS — E042 Nontoxic multinodular goiter: Secondary | ICD-10-CM | POA: Diagnosis not present

## 2024-06-26 DIAGNOSIS — E1169 Type 2 diabetes mellitus with other specified complication: Secondary | ICD-10-CM

## 2024-06-26 LAB — POCT GLYCOSYLATED HEMOGLOBIN (HGB A1C): Hemoglobin A1C: 6.6 % — AB (ref 4.0–5.6)

## 2024-06-26 NOTE — Progress Notes (Signed)
 Outpatient Endocrinology Note Janet Jazsmine Macari, MD  06/26/24  Patient's Name: Janet Williamson    DOB: July 16, 1941    MRN: [id number]                                                    REASON OF VISIT: Follow up for type 2 diabetes mellitus  PCP: Leonel Cole, MD  HISTORY OF PRESENT ILLNESS:   Janet Williamson is a 83 y.o. old female with past medical history listed below, is here for follow up for type 2 diabetes mellitus / multinodular goiter.   Pertinent Diabetes History: Patient was diagnosed with type 2 diabetes mellitus in 2013.  She has relatively well-controlled type 2 diabetes mellitus controlled on metformin  extended release.  # Patient has manage 10 neoplasm of head of pancreas /pancreatic cancer, deemed not curable, following with oncology.  Chronic Diabetes Complications : Retinopathy: no. Last ophthalmology exam was done annually reportedly. Nephropathy: no, She had swelling of the lips with ramipril  and has not been prescribed an ARB /ACE drug  Peripheral neuropathy: no Coronary artery disease: no Stroke: stroke  Relevant comorbidities and cardiovascular risk factors: Obesity: no Body mass index is 25.26 kg/m.  Hypertension: yes Hyperlipidemia. Yes, on a statin.  Current / Home Diabetic regimen includes: Metformin  extended release 750 mg 2 tablets daily.  Prior diabetic medications:  Glycemic data:   Forget to bring glucometer in the clinic today.  She reports blood sugar yesterday morning was 127.  Hypoglycemia: Patient has no hypoglycemic episodes. Patient has hypoglycemia awareness.  Factors modifying glucose control: 1.  Diabetic diet assessment: 3 meals a day.  Sometimes ice cream.  2.  Staying active or exercising: Not able to exercise due to musculoskeletal pain.  3.  Medication compliance: compliant all of the time.  # Multinodular goiter longstanding.  Last ultrasound was in August 2013, showed largest nodule to be 4.1 cm in the isthmus.  She has  occasional neck discomfort and feeling of choking but no difficulty swallowing and has remained stable for several years.  # Hypercalcemia : She has occasional high serum calcium  however has remained upper normal recently.  She does take hydrochlorothiazide  for hypertension.  PTH was low normal previously. Upper normal calcium  levels without hyperparathyroidism, stable and may be related to hydrochlorothiazide .  Interval history No glucose data to review.  August lately she remains on she has noticed blood sugar is slightly creeping up up to 160 range.  Yesterday morning was 127.  Hemoglobin A1c today 6.6%.  She has anemia.  She has pancreatic cancer deemed noncurable, following with oncology.  She has no GI issues related to metformin .  Diabetes regimen as reviewed above.  She has no other complaints today.  REVIEW OF SYSTEMS As per history of present illness.   PAST MEDICAL HISTORY: 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 Not seen on most recent TEE 2019.   Cardiomyopathy in other disease    Cataracts, bilateral    Diabetes mellitus    Type 2   Generalized osteoarthritis    GERD (gastroesophageal reflux disease)    HLD (hyperlipidemia)    Hypertension    Hypothyroidism    LBBB (left bundle branch block)  PAF (paroxysmal atrial fibrillation) (HCC)    a. s/p DCCV 06/2017, on Xarelto    Phlebitis    S/P TAVR (transcatheter aortic valve replacement) 04/15/2018   Edwards Sapien 3 THV (size 23 mm, model # 9600TFX, serial # [id number]) via the TF approach   Severe aortic stenosis    a. s/p TAVR 04/2018.   Stroke [facility]) 2008    PAST SURGICAL HISTORY: Past Surgical History:  Procedure Laterality Date   ABDOMINAL HYSTERECTOMY     BILIARY BRUSHING  10/16/2023   Procedure: BILIARY BRUSHING;  Surgeon: Burnette Fallow, MD;  Location: [facility];  Service: Gastroenterology;;   BILIARY  STENT PLACEMENT N/A 10/16/2023   Procedure: BILIARY STENT PLACEMENT;  Surgeon: Burnette Fallow, MD;  Location: [facility];  Service: Gastroenterology;  Laterality: N/A;   BIOPSY  05/19/2019   Procedure: BIOPSY;  Surgeon: Donnald Charleston, MD;  Location: [facility] ENDOSCOPY;  Service: Endoscopy;;   BREAST BIOPSY Left    years ago at a doctor's office   COLONOSCOPY     COLONOSCOPY WITH PROPOFOL  N/A 10/02/2018   Procedure: COLONOSCOPY WITH PROPOFOL ;  Surgeon: Burnette Fallow, MD;  Location: [facility] ENDOSCOPY;  Service: Endoscopy;  Laterality: N/A;   ENTEROSCOPY N/A 05/19/2019   Procedure: ENTEROSCOPY;  Surgeon: Donnald Charleston, MD;  Location: [facility] ENDOSCOPY;  Service: Endoscopy;  Laterality: N/A;   ERCP N/A 10/16/2023   Procedure: ENDOSCOPIC RETROGRADE CHOLANGIOPANCREATOGRAPHY (ERCP);  Surgeon: Burnette Fallow, MD;  Location: [facility];  Service: Gastroenterology;  Laterality: N/A;   ESOPHAGOGASTRODUODENOSCOPY (EGD) WITH PROPOFOL  N/A 10/02/2018   Procedure: ESOPHAGOGASTRODUODENOSCOPY (EGD) WITH PROPOFOL ;  Surgeon: Burnette Fallow, MD;  Location: [facility] ENDOSCOPY;  Service: Endoscopy;  Laterality: N/A;   ESOPHAGOGASTRODUODENOSCOPY (EGD) WITH PROPOFOL  N/A 04/23/2019   Procedure: ESOPHAGOGASTRODUODENOSCOPY (EGD) WITH PROPOFOL ;  Surgeon: Lennard Lesta FALCON, MD;  Location: [facility] ENDOSCOPY;  Service: Endoscopy;  Laterality: N/A;   ESOPHAGOGASTRODUODENOSCOPY (EGD) WITH PROPOFOL  N/A 10/16/2023   Procedure: ESOPHAGOGASTRODUODENOSCOPY (EGD) WITH PROPOFOL ;  Surgeon: Burnette Fallow, MD;  Location: [facility];  Service: Gastroenterology;  Laterality: N/A;   EUS N/A 10/16/2023   Procedure: FULL UPPER ENDOSCOPIC ULTRASOUND (EUS) RADIAL;  Surgeon: Burnette Fallow, MD;  Location: [facility];  Service: Gastroenterology;  Laterality: N/A;   EYE SURGERY Bilateral    cataract removal   FINE NEEDLE ASPIRATION N/A 10/16/2023   Procedure: FINE NEEDLE ASPIRATION (FNA) LINEAR;  Surgeon: Burnette Fallow, MD;  Location: [facility];  Service:  Gastroenterology;  Laterality: N/A;   GIVENS CAPSULE STUDY N/A 10/02/2018   Procedure: GIVENS CAPSULE STUDY;  Surgeon: Burnette Fallow, MD;  Location: [facility] ENDOSCOPY;  Service: Endoscopy;  Laterality: N/A;   IR IMAGING GUIDED PORT INSERTION  01/10/2024   RIGHT/LEFT HEART CATH AND CORONARY ANGIOGRAPHY N/A 03/06/2018   Procedure: RIGHT/LEFT HEART CATH AND CORONARY ANGIOGRAPHY;  Surgeon: Claudene Victory ORN, MD;  Location: [facility] INVASIVE CV LAB;  Service: Cardiovascular;  Laterality: N/A;   SMALL BOWEL ENTEROSCOPY  05/19/2019   SPHINCTEROTOMY  10/16/2023   Procedure: SPHINCTEROTOMY;  Surgeon: Burnette Fallow, MD;  Location: [facility];  Service: Gastroenterology;;   TEE WITHOUT CARDIOVERSION N/A 04/01/2018   Procedure: TRANSESOPHAGEAL ECHOCARDIOGRAM (TEE);  Surgeon: Jeffrie Oneil BROCKS, MD;  Location: [facility] ENDOSCOPY;  Service: Cardiovascular;  Laterality: N/A;   TEE WITHOUT CARDIOVERSION N/A 04/15/2018   Procedure: TRANSESOPHAGEAL ECHOCARDIOGRAM (TEE);  Surgeon: Wonda Sharper, MD;  Location: [facility] OR;  Service: Open Heart Surgery;  Laterality: N/A;   TOTAL KNEE ARTHROPLASTY Right 04/14/2013   Dr Addie   TOTAL KNEE ARTHROPLASTY Right 04/14/2013   Procedure: TOTAL KNEE ARTHROPLASTY;  Surgeon: Cordella Glendia Hutchinson, MD;  Location: [facility] OR;  Service: Orthopedics;  Laterality: Right;   TRANSCATHETER AORTIC VALVE REPLACEMENT, TRANSFEMORAL  04/15/2018   TRANSCATHETER AORTIC VALVE REPLACEMENT, TRANSFEMORAL Bilateral 04/15/2018   Procedure: TRANSCATHETER AORTIC VALVE REPLACEMENT, TRANSFEMORAL;  Surgeon: Wonda Sharper, MD;  Location: [facility] OR;  Service: Open Heart Surgery;  Laterality: Bilateral;    ALLERGIES: Allergies  Allergen Reactions   Ace Inhibitors Swelling and Other (See Comments)    Angioedema    Keflex [Cephalexin] Swelling and Other (See Comments)    Lips became swollen   Rifadin [Rifampin] Swelling and Other (See Comments)    Lips became swollen    FAMILY HISTORY:  Family History  Problem Relation Age of Onset    Stroke Mother    Hypertension Mother    Hypertension Father    Heart attack Neg Hx     SOCIAL HISTORY: Social History   Socioeconomic History   Marital status: Widowed    Spouse name: Not on file   Number of children: Not on file   Years of education: Not on file   Highest education level: Not on file  Occupational History   Not on file  Tobacco Use   Smoking status: Never   Smokeless tobacco: Never  Vaping Use   Vaping status: Never Used  Substance and Sexual Activity   Alcohol use: No   Drug use: No   Sexual activity: Not Currently  Other Topics Concern   Not on file  Social History Narrative   Not on file   Social Drivers of Health   Financial Resource Strain: Not on file  Food Insecurity: No Food Insecurity (05/28/2024)   Hunger Vital Sign    Worried About Running Out of Food in the Last Year: Never true    Ran Out of Food in the Last Year: Never true  Transportation Needs: No Transportation Needs (05/28/2024)   PRAPARE - Administrator, Civil Service (Medical): No    Lack of Transportation (Non-Medical): No  Physical Activity: Not on file  Stress: Not on file  Social Connections: Unknown (05/28/2024)   Social Connection and Isolation Panel    Frequency of Communication with Friends and Family: More than three times a week    Frequency of Social Gatherings with Friends and Family: Three times a week    Attends Religious Services: 1 to 4 times per year    Active Member of Clubs or Organizations: Yes    Attends Banker Meetings: 1 to 4 times per year    Marital Status: Not on file    MEDICATIONS:  Current Outpatient Medications  Medication Sig Dispense Refill   acetaminophen  (TYLENOL ) 500 MG tablet Take 1,000 mg by mouth every 8 (eight) hours as needed for mild pain (pain score 1-3) (for headaches).     amiodarone  (PACERONE ) 400 MG tablet Take 1 tablet (400 mg total) by mouth 2 (two) times daily for 7 days, THEN 0.5 tablets (200 mg  total) 2 (two) times daily. 44 tablet 0   baclofen  (LIORESAL ) 10 MG tablet Take 1 tablet (10 mg total) by mouth 3 (three) times daily. 30 each 0   Cholecalciferol  (VITAMIN D3) 5000 units CAPS Take 5,000 Units by mouth daily.     CREON  36000-114000 units CPEP capsule TAKE TWO (2) CAPSULES BY MOUTH THREE TIMES A DAY WITH MEALS TAKE 1 CAPSULE BY MOUTH AS NEEDED WITH SNACK. MAX 10/DAY *NEW PRESCRIPTION REQUEST* 900 capsule 11   cyanocobalamin  (VITAMIN B12)  500 MCG tablet Take 1,000 mcg by mouth in the morning. Take two tablets by mouth every morning.     furosemide  (LASIX ) 20 MG tablet Take 1 tablet (20 mg total) by mouth daily. Can take an additional tablet as needed for weight gain 45 tablet 1   lidocaine -prilocaine  (EMLA ) cream APPLY A QUARTER SIZE AMOUNT OF CREAM TO PORT - A- CATH 45 MINUTES TO 1 HOUR PRIOR TO PORT-A-CATH BEING ACCESS. *NEW PRESCRIPTION REQUEST* 90 g 11   loperamide  (IMODIUM ) 2 MG capsule Take 1 capsule (2 mg total) by mouth 4 (four) times daily as needed for diarrhea or loose stools. 12 capsule 0   metFORMIN  (GLUCOPHAGE -XR) 750 MG 24 hr tablet TAKE 2 TABLETS ONE TIME DAILY WITH BREAKFAST 180 tablet 2   metoprolol  tartrate (LOPRESSOR ) 25 MG tablet Take 1 tablet (25 mg total) by mouth 2 (two) times daily. 60 tablet 3   pantoprazole  (PROTONIX ) 20 MG tablet TAKE 1 TABLET BY MOUTH DAILY *NEW PRESCRIPTION REQUEST* 90 tablet 11   Polyethyl Glycol-Propyl Glycol (SYSTANE OP) Place 1 drop into both eyes 2 (two) times daily.      rivaroxaban  (XARELTO ) 20 MG TABS tablet Take 1 tablet (20 mg total) by mouth daily with supper. 30 tablet 0   traMADol  (ULTRAM ) 50 MG tablet Take 0.5-1 tablets (25-50 mg total) by mouth every 6 (six) hours as needed. 30 tablet 0   No current facility-administered medications for this visit.   Facility-Administered Medications Ordered in Other Visits  Medication Dose Route Frequency Provider Last Rate Last Admin   0.9 %  sodium chloride  infusion   Intravenous  Continuous Janet Callander, MD   Stopped at 12/11/23 1305   0.9 %  sodium chloride  infusion   Intravenous Continuous Janet Callander, MD   Stopped at 12/11/23 1304    PHYSICAL EXAM: Vitals:   06/26/24 0928 06/26/24 0929  BP: (!) 162/60 (!) 158/62  Pulse: 68   Resp: 20   SpO2: 98%   Weight: 142 lb 9.6 oz (64.7 kg)   Height: 5' 3 (1.6 m)       Body mass index is 25.26 kg/m.  Wt Readings from Last 3 Encounters:  06/26/24 142 lb 9.6 oz (64.7 kg)  06/15/24 142 lb (64.4 kg)  06/11/24 142 lb (64.4 kg)    General: Well developed, well nourished female in no apparent distress.  HEENT: AT/, no external lesions.  Eyes: Conjunctiva clear and no icterus. Neck: Neck supple.  Thyromegaly with isthmus nodule about 3 cm, mobile soft, nontender present. Lungs: Respirations not labored Neurologic: Alert, oriented, normal speech Extremities / Skin: Dry.  Psychiatric: Does not appear depressed or anxious  Diabetic Foot Exam - Simple   No data filed    LABS Reviewed Lab Results  Component Value Date   HGBA1C 6.6 (A) 06/26/2024   HGBA1C 7.1 (A) 01/13/2024   HGBA1C 7.6 (A) 07/15/2023   Lab Results  Component Value Date   FRUCTOSAMINE 250 12/03/2019   FRUCTOSAMINE 247 08/03/2019   Lab Results  Component Value Date   CHOL 137 01/07/2024   HDL 42 (L) 01/07/2024   LDLCALC 78 01/07/2024   TRIG 91 01/07/2024   CHOLHDL 3.3 01/07/2024   Lab Results  Component Value Date   MICRALBCREAT 5 01/07/2024   Lab Results  Component Value Date   CREATININE 0.84 06/09/2024   Lab Results  Component Value Date   GFR 70.90 01/15/2023    ASSESSMENT / PLAN  1. Type 2 diabetes mellitus with other  specified complication, without long-term current use of insulin  (HCC)   2. Multinodular goiter    Diabetes Mellitus type 2, complicated by stroke. - Diabetic status / severity: Fair control.  Lab Results  Component Value Date   HGBA1C 6.6 (A) 06/26/2024    - Hemoglobin A1c goal : <7.5%  She has  pancreatic cancer, deemed not curable.  Noted no plan for pancreatic surgery.  She has reasonable control of type 2 diabetes mellitus.  Hemoglobin A1c is likely falsely low due to anemia.  She can have permissive hyperglycemia.  Will stay on the current regimen.  Goal blood sugar should be 100-200 range.  Patient is asked to call our clinic if there is high blood sugar.  - Medications: No change.  I) continue metformin  extended release 750 mg 2 tablets daily.  - Home glucose testing: At least in the morning fasting, and occasionally at bedtime.  After bring glucometer in the follow-up visit.  - Discussed/ Gave Hypoglycemia treatment plan.  # Consult : not required at this time.   # Annual urine for microalbuminuria/ creatinine ratio, no microalbuminuria currently. Last  Lab Results  Component Value Date   MICRALBCREAT 5 01/07/2024    # Foot check nightly / neuropathy.  # Annual dilated diabetic eye exams.   - Diet: Make healthy diabetic food choices - Life style / activity / exercise: Discussed.  2. Blood pressure  -  BP Readings from Last 1 Encounters:  06/26/24 (!) 158/62    - Control is not in target. Asked to follow-up with primary care provider for blood pressure management.  Asked to monitor blood pressure if possible at home. - No change in current plans.   3. Lipid status / Hyperlipidemia - Last  Lab Results  Component Value Date   LDLCALC 78 01/07/2024   - Continue simvastatin  20 mg daily.  # Multinodular goiter : Longstanding.  Last ultrasound in 2013 with largest nodule measuring 4.1 cm in the isthmus. -Will continue to monitor clinically. -Annual thyroid  lab.  She is euthyroid and not on thyroid  medication.  She had normal thyroid  function test with TSH of 1.176 in August 2025.   Diagnoses and all orders for this visit:  Type 2 diabetes mellitus with other specified complication, without long-term current use of insulin  (HCC) -     POCT glycosylated  hemoglobin (Hb A1C)  Multinodular goiter   DISPOSITION Follow up in clinic in 4 months suggested.   All questions answered and patient verbalized understanding of the plan.  Janet Shaguana Love, MD [facility] Endocrinology [facility] [address] [place], [address] [place], [place] [address] Phone # [phone]  At least part of this note was generated using voice recognition software. Inadvertent word errors may have occurred, which were not recognized during the proofreading process.

## 2024-06-30 DIAGNOSIS — E1165 Type 2 diabetes mellitus with hyperglycemia: Secondary | ICD-10-CM | POA: Diagnosis not present

## 2024-06-30 DIAGNOSIS — E1169 Type 2 diabetes mellitus with other specified complication: Secondary | ICD-10-CM | POA: Diagnosis not present

## 2024-06-30 DIAGNOSIS — I350 Nonrheumatic aortic (valve) stenosis: Secondary | ICD-10-CM | POA: Diagnosis not present

## 2024-06-30 DIAGNOSIS — I10 Essential (primary) hypertension: Secondary | ICD-10-CM | POA: Diagnosis not present

## 2024-07-07 DIAGNOSIS — I10 Essential (primary) hypertension: Secondary | ICD-10-CM | POA: Diagnosis not present

## 2024-07-07 DIAGNOSIS — M1990 Unspecified osteoarthritis, unspecified site: Secondary | ICD-10-CM | POA: Diagnosis not present

## 2024-07-07 DIAGNOSIS — E1169 Type 2 diabetes mellitus with other specified complication: Secondary | ICD-10-CM | POA: Diagnosis not present

## 2024-07-07 DIAGNOSIS — I350 Nonrheumatic aortic (valve) stenosis: Secondary | ICD-10-CM | POA: Diagnosis not present

## 2024-07-07 DIAGNOSIS — E1165 Type 2 diabetes mellitus with hyperglycemia: Secondary | ICD-10-CM | POA: Diagnosis not present

## 2024-07-07 DIAGNOSIS — I480 Paroxysmal atrial fibrillation: Secondary | ICD-10-CM | POA: Diagnosis not present

## 2024-07-28 ENCOUNTER — Encounter: Payer: Self-pay | Admitting: Nurse Practitioner

## 2024-07-28 ENCOUNTER — Inpatient Hospital Stay: Attending: Nurse Practitioner | Admitting: Nurse Practitioner

## 2024-07-28 DIAGNOSIS — C250 Malignant neoplasm of head of pancreas: Secondary | ICD-10-CM | POA: Insufficient documentation

## 2024-07-28 DIAGNOSIS — R630 Anorexia: Secondary | ICD-10-CM

## 2024-07-28 DIAGNOSIS — M545 Low back pain, unspecified: Secondary | ICD-10-CM | POA: Insufficient documentation

## 2024-07-28 DIAGNOSIS — R530 Neoplastic (malignant) related fatigue: Secondary | ICD-10-CM | POA: Diagnosis not present

## 2024-07-28 DIAGNOSIS — G893 Neoplasm related pain (acute) (chronic): Secondary | ICD-10-CM | POA: Insufficient documentation

## 2024-07-28 DIAGNOSIS — Z515 Encounter for palliative care: Secondary | ICD-10-CM | POA: Diagnosis not present

## 2024-07-28 DIAGNOSIS — Z66 Do not resuscitate: Secondary | ICD-10-CM | POA: Insufficient documentation

## 2024-07-28 MED ORDER — TRAMADOL HCL 50 MG PO TABS: 25.0000 mg | ORAL_TABLET | Freq: Four times a day (QID) | ORAL | 0 refills | Status: DC | PRN

## 2024-07-28 NOTE — Progress Notes (Signed)
 Palliative Medicine [facility]  [phone] [phone]   Name: Janet Williamson Date: 07/28/2024 MRN: [id number]  DOB: 09-03-1941  Patient Care Team: Leonel Cole, MD as PCP - General (Family Medicine) Jeffrie Oneil BROCKS, MD as PCP - Cardiology (Cardiology) Lanny Callander, MD as Consulting Physician (Hematology) Burnette Fallow, MD as Consulting Physician (Gastroenterology) Rosalie Kitchens, MD as Consulting Physician (Gastroenterology) Pickenpack-Cousar, Fannie SAILOR, NP as Nurse Practitioner [facility] and Palliative Medicine)   I connected with Janet Williamson on 07/28/24 at  3:20 PM EDT by telephone and verified that I am speaking with the correct person using two identifiers.   I discussed the limitations, risks, security and privacy concerns of performing an evaluation and management service by telemedicine and the availability of in-person appointments. I also discussed with the patient that there may be a patient responsible charge related to this service. The patient expressed understanding and agreed to proceed.   Other persons participating in the visit and their role in the encounter: N/A   Patient's location: Home   Provider's location: [facility]   INTERVAL HISTORY: Meriam Chojnowski Neth is a 83 y.o. female with  oncologic medical history including pancreatic cancer (08/2023) not a further candidate for further treatment due to age and recent hospitalization for endorcarditis (02/2024).  Palliative is seeing patient for symptom management and goals of care.   SOCIAL HISTORY:     reports that she has never smoked. She has never used smokeless tobacco. She reports that she does not drink alcohol and does not use drugs.  ADVANCE DIRECTIVES:  None on file  CODE STATUS: DNR  PAST MEDICAL HISTORY: 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 Not seen on  most recent TEE 2019.   Cardiomyopathy in other disease    Cataracts, bilateral    Diabetes mellitus    Type 2   Generalized osteoarthritis    GERD (gastroesophageal reflux disease)    HLD (hyperlipidemia)    Hypertension    Hypothyroidism    LBBB (left bundle branch block)    PAF (paroxysmal atrial fibrillation) (HCC)    a. s/p DCCV 06/2017, on Xarelto    Phlebitis    S/P TAVR (transcatheter aortic valve replacement) 04/15/2018   Edwards Sapien 3 THV (size 23 mm, model # 9600TFX, serial # [id number]) via the TF approach   Severe aortic stenosis    a. s/p TAVR 04/2018.   Stroke [facility]) 2008    ALLERGIES:  is allergic to ace inhibitors, keflex [cephalexin], and rifadin [rifampin].  MEDICATIONS:  Current Outpatient Medications  Medication Sig Dispense Refill   acetaminophen  (TYLENOL ) 500 MG tablet Take 1,000 mg by mouth every 8 (eight) hours as needed for mild pain (pain score 1-3) (for headaches).     amiodarone  (PACERONE ) 400 MG tablet Take 1 tablet (400 mg total) by mouth 2 (two) times daily for 7 days, THEN 0.5 tablets (200 mg total) 2 (two) times daily. 44 tablet 0   baclofen  (LIORESAL ) 10 MG tablet Take 1 tablet (10 mg total) by mouth 3 (three) times daily. 30 each 0   Cholecalciferol  (VITAMIN D3) 5000 units CAPS Take 5,000 Units by mouth daily.     CREON  36000-114000 units CPEP capsule TAKE TWO (2) CAPSULES BY MOUTH THREE TIMES A DAY WITH MEALS TAKE 1 CAPSULE BY MOUTH AS NEEDED WITH SNACK. MAX 10/DAY *NEW  PRESCRIPTION REQUEST* 900 capsule 11   cyanocobalamin  (VITAMIN B12) 500 MCG tablet Take 1,000 mcg by mouth in the morning. Take two tablets by mouth every morning.     furosemide  (LASIX ) 20 MG tablet Take 1 tablet (20 mg total) by mouth daily. Can take an additional tablet as needed for weight gain 45 tablet 1   lidocaine -prilocaine  (EMLA ) cream APPLY A QUARTER SIZE AMOUNT OF CREAM TO PORT - A- CATH 45 MINUTES TO 1 HOUR PRIOR TO PORT-A-CATH BEING ACCESS. *NEW PRESCRIPTION REQUEST* 90 g  11   loperamide  (IMODIUM ) 2 MG capsule Take 1 capsule (2 mg total) by mouth 4 (four) times daily as needed for diarrhea or loose stools. 12 capsule 0   metFORMIN  (GLUCOPHAGE -XR) 750 MG 24 hr tablet TAKE 2 TABLETS ONE TIME DAILY WITH BREAKFAST 180 tablet 2   metoprolol  tartrate (LOPRESSOR ) 25 MG tablet Take 1 tablet (25 mg total) by mouth 2 (two) times daily. 60 tablet 3   pantoprazole  (PROTONIX ) 20 MG tablet TAKE 1 TABLET BY MOUTH DAILY *NEW PRESCRIPTION REQUEST* 90 tablet 11   Polyethyl Glycol-Propyl Glycol (SYSTANE OP) Place 1 drop into both eyes 2 (two) times daily.      rivaroxaban  (XARELTO ) 20 MG TABS tablet Take 1 tablet (20 mg total) by mouth daily with supper. 30 tablet 0   traMADol  (ULTRAM ) 50 MG tablet Take 0.5-1 tablets (25-50 mg total) by mouth every 6 (six) hours as needed. 30 tablet 0   No current facility-administered medications for this visit.   Facility-Administered Medications Ordered in Other Visits  Medication Dose Route Frequency Provider Last Rate Last Admin   0.9 %  sodium chloride  infusion   Intravenous Continuous Lanny Callander, MD   Stopped at 12/11/23 1305   0.9 %  sodium chloride  infusion   Intravenous Continuous Lanny Callander, MD   Stopped at 12/11/23 1304    VITAL SIGNS: There were no vitals taken for this visit. There were no vitals filed for this visit.   Estimated body mass index is 25.26 kg/m as calculated from the following:   Height as of 06/26/24: 5' 3 (1.6 m).   Weight as of 06/26/24: 142 lb 9.6 oz (64.7 kg).   PERFORMANCE STATUS (ECOG) : 1 - Symptomatic but completely ambulatory  IMPRESSION: Discussed the use of AI scribe software for clinical note transcription with the patient, who gave verbal consent to proceed.  History of Present Illness Janet Williamson is an 83 year old female with pancreatic cancer who I connected with by phone for follow-up.  No acute distress noted. Patient is ambulatory with a rollator.  States she is doing well  overall.  Reports her appetite has improved since last visit. Some days are better than others. Denies uncontrolled constipation, diarrhea, nausea, or vomiting. Sleeping well at night.   She experiences occasional pain, managed with Tylenol , taking two extra strength tablets as needed. Feels as though her back pain has worsened, specifically after wearing her bra for long periods of time. We discussed use of Tramadol  as needed for uncontrolled pain. She verbalized understanding confirming she does not have any tramadol  at home currently. Will send prescription to specified pharmacy.   We will continue to closely monitor and support.   Goals of care 03/11/2024:We discussed her current illness and what it means in the larger context of her on-going co-morbidities. Natural disease trajectory and expectations were discussed. Ms. Merk and her granddaughter are realistic in their understanding of her terminal cancer with no further treatment  options.    She is clear in expressed wishes to continue to treat the treatable and aggressively manage her symptoms at this time. I empathetically approached discussions regarding healthcare limitations.    Education provided on the differences, goals and philosophy, and what care would look like with palliative versus hospice. Ms. Babe is clear that she is not interested in pursuing hospice care at this time. She shares she almost did not want to come to her appointment today with myself out of fear she was being enrolled in hospice. I had open discussion with patient sharing at a point hospice would be appropriate for her care to allow her to spend what time she has left comfortable amongst her family. She verbalized understanding and appreciation.    Patient is a DNR/DNI as confirmed. We will continue ongoing goals of care discussions as needed.   I discussed the importance of continued conversation with family and their medical providers regarding overall  plan of care and treatment options, ensuring decisions are within the context of the patients values and GOCs. Assessment & Plan Cancer related pain and lower back and leg Pain is well-controlled with current regimen of Tylenol  and occasional tramadol  for moderate to severe pain. - Continue Tylenol  as needed. - Use tramadol  for moderate to severe pain as needed.  Goals of Care Patient and family expressed clear understanding of terminal pancreatic cancer. They are currently not interested in enrolling in hospice.  -Goals clear to continue treating the treatable allowing patient an opportunity to continue thriving while aggressively managing symptoms.  -DNR/DNI -Direct discussion regarding need for hospice in the future as health further declines.  -We will continue goals of care discussions offering support to patient and family as they navigate complex decisions.   Follow-Up Follow-up appointment scheduled with Doctor Lanny at the cancer center on October 28th. - I will see patient in clinic 3-4 weeks. Sooner if needed.   Patient expressed understanding and was in agreement with this plan. She also understands that She can call the clinic at any time with any questions, concerns, or complaints.   Any controlled substances utilized were prescribed in the context of palliative care. PDMP has been reviewed.   I provided 25 minutes of non face-to-face telephone visit time during this encounter, and > 50% was spent counseling as documented under my assessment & plan. Visit consisted of counseling and education dealing with the complex and emotionally intense issues of symptom management and palliative care in the setting of serious and potentially life-threatening illness.  Levon Borer, AGPCNP-BC  Palliative Medicine Team/ Cancer Center

## 2024-07-30 DIAGNOSIS — E1165 Type 2 diabetes mellitus with hyperglycemia: Secondary | ICD-10-CM | POA: Diagnosis not present

## 2024-07-30 DIAGNOSIS — I10 Essential (primary) hypertension: Secondary | ICD-10-CM | POA: Diagnosis not present

## 2024-07-30 DIAGNOSIS — I350 Nonrheumatic aortic (valve) stenosis: Secondary | ICD-10-CM | POA: Diagnosis not present

## 2024-07-30 DIAGNOSIS — E1169 Type 2 diabetes mellitus with other specified complication: Secondary | ICD-10-CM | POA: Diagnosis not present

## 2024-08-04 ENCOUNTER — Inpatient Hospital Stay

## 2024-08-04 ENCOUNTER — Encounter

## 2024-08-04 ENCOUNTER — Inpatient Hospital Stay: Admitting: Hematology

## 2024-08-04 DIAGNOSIS — M545 Low back pain, unspecified: Secondary | ICD-10-CM | POA: Diagnosis not present

## 2024-08-04 DIAGNOSIS — C250 Malignant neoplasm of head of pancreas: Secondary | ICD-10-CM

## 2024-08-04 DIAGNOSIS — G893 Neoplasm related pain (acute) (chronic): Secondary | ICD-10-CM | POA: Diagnosis not present

## 2024-08-04 DIAGNOSIS — Z66 Do not resuscitate: Secondary | ICD-10-CM | POA: Diagnosis not present

## 2024-08-04 NOTE — Assessment & Plan Note (Signed)
 T3N0 by EUS, questionable peritoneal carcinomatosis -She had an episode of abdominal pain, nausea, and dizziness at church and came to ED 08/18/2023 -CT AP done 10/08/2023 showing a stable solid right lower lobe pulmonary nodule from 2019 and an ill-defined hypodense lesion in the pancreatic head measuring 4.4 x 3.9 x 3.3 cm obstructing the pancreatic and CBD appearing to encase and occlude the SMV and portal confluence with soft tissue stranding extending along the mesenteric root with nodular foci peritoneal soft tissue nodularity anterior to the mass; overall concerning for primary pancreatic malignancy with peritoneal carcinomatosis.    -EUS 10/16/23 by Dr. Burnette showing a T3 N0 pancreatic head mass invading the SMA and ERCP with stenting by Dr. Rosalie, cytology confirming adenocarcinoma.  -Due to her age, the vascular invasion/abutment of the SMA, and possible peritoneal involvement, she is likely not a surgical candidate but Dr. Aron has reviewed her case and agreed to see the pt for surgical discussion. She was seen on 11/26/2023 and felt to be a poor candidate for surgery  -PET scan showed no definite evidence of metastatic disease.  The questionable peritoneal involvement on CT is felt to be local extension -plan to start chemo gemcitabine  alone, and may add Abraxane  from second cycle if tolerates well. She started chemo on 11/13/2023, she did not tolerate gem/Abraxane , and chemo changed back to gemcitabine  alone  -restaging CT 4/23 showed sable pancreatic mass but a new 1.1cm liver lesion which is suspicious for new met, which also appears to be suspicious on liver MRI in early May -Pt was hospitalized for acute endocarditis on 02/12/2024 and she received antibiotics for 6 weeks -chemo held and she switched to supportive care alone

## 2024-08-04 NOTE — Progress Notes (Signed)
 [facility]   [phone] Fax:(336) [phone]   Clinic Follow up Note   Patient Care Team: Leonel Cole, MD as PCP - General (Family Medicine) Jeffrie Oneil BROCKS, MD as PCP - Cardiology (Cardiology) Lanny Callander, MD as Consulting Physician (Hematology) Burnette Fallow, MD as Consulting Physician (Gastroenterology) Rosalie Kitchens, MD as Consulting Physician (Gastroenterology) Pickenpack-Cousar, Fannie SAILOR, NP as Nurse Practitioner [facility] and Palliative Medicine) 08/04/2024  I connected with Janet Williamson on 08/04/24 at  9:40 AM EDT by telephone and verified that I am speaking with the correct person using two identifiers.   I discussed the limitations, risks, security and privacy concerns of performing an evaluation and management service by telephone and the availability of in person appointments. I also discussed with the patient that there may be a patient responsible charge related to this service. The patient expressed understanding and agreed to proceed.   Patient's location:  Home  Provider's location:  Office    CHIEF COMPLAINT: Follow-up for pancreatic cancer   CURRENT THERAPY: Supportive care  Oncology history Malignant neoplasm of head of pancreas (HCC) T3N0 by EUS, questionable peritoneal carcinomatosis -She had an episode of abdominal pain, nausea, and dizziness at church and came to ED 08/18/2023 -CT AP done 10/08/2023 showing a stable solid right lower lobe pulmonary nodule from 2019 and an ill-defined hypodense lesion in the pancreatic head measuring 4.4 x 3.9 x 3.3 cm obstructing the pancreatic and CBD appearing to encase and occlude the SMV and portal confluence with soft tissue stranding extending along the mesenteric root with nodular foci peritoneal soft tissue nodularity anterior to the mass; overall concerning for primary pancreatic malignancy with peritoneal carcinomatosis.    -EUS 10/16/23 by Dr. Burnette showing a T3 N0 pancreatic head mass  invading the SMA and ERCP with stenting by Dr. Rosalie, cytology confirming adenocarcinoma.  -Due to her age, the vascular invasion/abutment of the SMA, and possible peritoneal involvement, she is likely not a surgical candidate but Dr. Aron has reviewed her case and agreed to see the pt for surgical discussion. She was seen on 11/26/2023 and felt to be a poor candidate for surgery  -PET scan showed no definite evidence of metastatic disease.  The questionable peritoneal involvement on CT is felt to be local extension -plan to start chemo gemcitabine  alone, and may add Abraxane  from second cycle if tolerates well. She started chemo on 11/13/2023, she did not tolerate gem/Abraxane , and chemo changed back to gemcitabine  alone  -restaging CT 4/23 showed sable pancreatic mass but a new 1.1cm liver lesion which is suspicious for new met, which also appears to be suspicious on liver MRI in early May -Pt was hospitalized for acute endocarditis on 02/12/2024 and she received antibiotics for 6 weeks -chemo held and she switched to supportive care alone    Assessment and Plan Assessment & Plan Pancreatic cancer, head of pancreas Pancreatic cancer located at the head of the pancreas. No new symptoms or significant pain reported. Currently managing well with symptom control. No imaging planned unless symptoms change. - Coordinate care with primary provider [facility] for symptom management - Remain available for consultation if significant pain or new symptoms develop - Consider imaging if significant pain or new symptoms arise  Port management Implanted venous access device requires regular maintenance to ensure functionality and prevent complications. - Schedule port flush and lab work during next [facility] appointment [facility] - She is clinically doing well, will continue supportive care.  She will follow-up  with palliative care clinic NP [facility], and I will see her as needed.     SUMMARY OF ONCOLOGIC  HISTORY: Oncology History  Malignant neoplasm of head of pancreas (HCC)  10/16/2023 Cancer Staging   Staging form: Exocrine Pancreas, AJCC 8th Edition - Clinical stage from 10/16/2023: Stage IIA (cT3, cN0, cM0) - Signed by Lanny Callander, MD on 11/20/2023 Total positive nodes: 0   10/24/2023 Initial Diagnosis   Malignant neoplasm of head of pancreas (HCC)   11/13/2023 -  Chemotherapy   Patient is on Treatment Plan : PANCREATIC Abraxane  D1,8,15 + Gemcitabine  D1,8,15 q28d     12/04/2023 Genetic Testing   Negative genetic testing on the CancerNext+RNA panel.  BRCA1  p.D695N (c.2083G>A)  VUS identified.  The report date is 12/03/2023.  The [employer] CancerNext+RNAinsight Panel includes sequencing, rearrangement analysis, and RNA analysis for the following 39 genes: APC, ATM, BAP1, BARD1, BMPR1A, BRCA1, BRCA2, BRIP1, CDH1, CDKN2A, CHEK2, FH, FLCN, MET, MLH1, MSH2, MSH6, MUTYH, NF1, NTHL1, PALB2, PMS2, PTEN, RAD51C, RAD51D, SMAD4, STK11, TP53, TSC1, TSC2, and VHL (sequencing and deletion/duplication); AXIN2, HOXB13, MBD4, MSH3, POLD1 and POLE (sequencing only); EPCAM and GREM1 (deletion/duplication only).      Discussed the use of AI scribe software for clinical note transcription with the patient, who gave verbal consent to proceed.  History of Present Illness Janet Williamson is an 83 year old female with pancreatic cancer who presents for follow-up. She is accompanied by her grandson.  She has not experienced any new symptoms since her last visit in early September. She denies current pain or stomach discomfort. Her port was last flushed during her visit in September.     REVIEW OF SYSTEMS:   Constitutional: Denies fevers, chills or abnormal weight loss Eyes: Denies blurriness of vision Ears, nose, mouth, throat, and face: Denies mucositis or sore throat Respiratory: Denies cough, dyspnea or wheezes Cardiovascular: Denies palpitation, chest discomfort or lower extremity swelling Gastrointestinal:   Denies nausea, heartburn or change in bowel habits Skin: Denies abnormal skin rashes Lymphatics: Denies new lymphadenopathy or easy bruising Neurological:Denies numbness, tingling or new weaknesses Behavioral/Psych: Mood is stable, no new changes  All other systems were reviewed with the patient and are negative.  MEDICAL HISTORY:  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 Not seen on most recent TEE 2019.   Cardiomyopathy in other disease    Cataracts, bilateral    Diabetes mellitus    Type 2   Generalized osteoarthritis    GERD (gastroesophageal reflux disease)    HLD (hyperlipidemia)    Hypertension    Hypothyroidism    LBBB (left bundle branch block)    PAF (paroxysmal atrial fibrillation) (HCC)    a. s/p DCCV 06/2017, on Xarelto    Phlebitis    S/P TAVR (transcatheter aortic valve replacement) 04/15/2018   Edwards Sapien 3 THV (size 23 mm, model # 9600TFX, serial # [id number]) via the TF approach   Severe aortic stenosis    a. s/p TAVR 04/2018.   Stroke [facility]) 2008    SURGICAL HISTORY: Past Surgical History:  Procedure Laterality Date   ABDOMINAL HYSTERECTOMY     BILIARY BRUSHING  10/16/2023   Procedure: BILIARY BRUSHING;  Surgeon: Burnette Fallow, MD;  Location: [facility];  Service: Gastroenterology;;   BILIARY STENT PLACEMENT N/A 10/16/2023   Procedure: BILIARY STENT PLACEMENT;  Surgeon: Burnette Fallow, MD;  Location: [facility];  Service: Gastroenterology;  Laterality: N/A;   BIOPSY  05/19/2019   Procedure: BIOPSY;  Surgeon: Donnald Charleston, MD;  Location: [facility] ENDOSCOPY;  Service: Endoscopy;;   BREAST BIOPSY Left    years ago at a doctor's office   COLONOSCOPY     COLONOSCOPY WITH PROPOFOL  N/A 10/02/2018   Procedure: COLONOSCOPY WITH PROPOFOL ;  Surgeon: Burnette Fallow, MD;  Location: [facility] ENDOSCOPY;  Service: Endoscopy;  Laterality: N/A;   ENTEROSCOPY N/A 05/19/2019    Procedure: ENTEROSCOPY;  Surgeon: Donnald Charleston, MD;  Location: [facility] ENDOSCOPY;  Service: Endoscopy;  Laterality: N/A;   ERCP N/A 10/16/2023   Procedure: ENDOSCOPIC RETROGRADE CHOLANGIOPANCREATOGRAPHY (ERCP);  Surgeon: Burnette Fallow, MD;  Location: [facility];  Service: Gastroenterology;  Laterality: N/A;   ESOPHAGOGASTRODUODENOSCOPY (EGD) WITH PROPOFOL  N/A 10/02/2018   Procedure: ESOPHAGOGASTRODUODENOSCOPY (EGD) WITH PROPOFOL ;  Surgeon: Burnette Fallow, MD;  Location: [facility] ENDOSCOPY;  Service: Endoscopy;  Laterality: N/A;   ESOPHAGOGASTRODUODENOSCOPY (EGD) WITH PROPOFOL  N/A 04/23/2019   Procedure: ESOPHAGOGASTRODUODENOSCOPY (EGD) WITH PROPOFOL ;  Surgeon: Lennard Lesta FALCON, MD;  Location: [facility] ENDOSCOPY;  Service: Endoscopy;  Laterality: N/A;   ESOPHAGOGASTRODUODENOSCOPY (EGD) WITH PROPOFOL  N/A 10/16/2023   Procedure: ESOPHAGOGASTRODUODENOSCOPY (EGD) WITH PROPOFOL ;  Surgeon: Burnette Fallow, MD;  Location: [facility] ENDOSCOPY;  Service: Gastroenterology;  Laterality: N/A;   EUS N/A 10/16/2023   Procedure: FULL UPPER ENDOSCOPIC ULTRASOUND (EUS) RADIAL;  Surgeon: Burnette Fallow, MD;  Location: [facility] ENDOSCOPY;  Service: Gastroenterology;  Laterality: N/A;   EYE SURGERY Bilateral    cataract removal   FINE NEEDLE ASPIRATION N/A 10/16/2023   Procedure: FINE NEEDLE ASPIRATION (FNA) LINEAR;  Surgeon: Burnette Fallow, MD;  Location: [facility] ENDOSCOPY;  Service: Gastroenterology;  Laterality: N/A;   GIVENS CAPSULE STUDY N/A 10/02/2018   Procedure: GIVENS CAPSULE STUDY;  Surgeon: Burnette Fallow, MD;  Location: [facility] ENDOSCOPY;  Service: Endoscopy;  Laterality: N/A;   IR IMAGING GUIDED PORT INSERTION  01/10/2024   RIGHT/LEFT HEART CATH AND CORONARY ANGIOGRAPHY N/A 03/06/2018   Procedure: RIGHT/LEFT HEART CATH AND CORONARY ANGIOGRAPHY;  Surgeon: Claudene Victory ORN, MD;  Location: [facility] INVASIVE CV LAB;  Service: Cardiovascular;  Laterality: N/A;   SMALL BOWEL ENTEROSCOPY  05/19/2019   SPHINCTEROTOMY  10/16/2023   Procedure: SPHINCTEROTOMY;   Surgeon: Burnette Fallow, MD;  Location: [facility] ENDOSCOPY;  Service: Gastroenterology;;   TEE WITHOUT CARDIOVERSION N/A 04/01/2018   Procedure: TRANSESOPHAGEAL ECHOCARDIOGRAM (TEE);  Surgeon: Jeffrie Oneil BROCKS, MD;  Location: [facility] ENDOSCOPY;  Service: Cardiovascular;  Laterality: N/A;   TEE WITHOUT CARDIOVERSION N/A 04/15/2018   Procedure: TRANSESOPHAGEAL ECHOCARDIOGRAM (TEE);  Surgeon: Wonda Sharper, MD;  Location: [facility] OR;  Service: Open Heart Surgery;  Laterality: N/A;   TOTAL KNEE ARTHROPLASTY Right 04/14/2013   Dr Addie   TOTAL KNEE ARTHROPLASTY Right 04/14/2013   Procedure: TOTAL KNEE ARTHROPLASTY;  Surgeon: Cordella Glendia Addie, MD;  Location: [facility] OR;  Service: Orthopedics;  Laterality: Right;   TRANSCATHETER AORTIC VALVE REPLACEMENT, TRANSFEMORAL  04/15/2018   TRANSCATHETER AORTIC VALVE REPLACEMENT, TRANSFEMORAL Bilateral 04/15/2018   Procedure: TRANSCATHETER AORTIC VALVE REPLACEMENT, TRANSFEMORAL;  Surgeon: Wonda Sharper, MD;  Location: [facility] OR;  Service: Open Heart Surgery;  Laterality: Bilateral;    I have reviewed the social history and family history with the patient and they are unchanged from previous note.  ALLERGIES:  is allergic to ace inhibitors, keflex [cephalexin], and rifadin [rifampin].  MEDICATIONS:  Current Outpatient Medications  Medication Sig Dispense Refill   acetaminophen  (TYLENOL ) 500 MG tablet Take 1,000 mg by mouth every 8 (eight) hours as needed for mild pain (pain  score 1-3) (for headaches).     amiodarone  (PACERONE ) 400 MG tablet Take 1 tablet (400 mg total) by mouth 2 (two) times daily for 7 days, THEN 0.5 tablets (200 mg total) 2 (two) times daily. 44 tablet 0   baclofen  (LIORESAL ) 10 MG tablet Take 1 tablet (10 mg total) by mouth 3 (three) times daily. 30 each 0   Cholecalciferol  (VITAMIN D3) 5000 units CAPS Take 5,000 Units by mouth daily.     CREON  36000-114000 units CPEP capsule TAKE TWO (2) CAPSULES BY MOUTH THREE TIMES A DAY WITH MEALS TAKE 1 CAPSULE BY MOUTH AS  NEEDED WITH SNACK. MAX 10/DAY *NEW PRESCRIPTION REQUEST* 900 capsule 11   cyanocobalamin  (VITAMIN B12) 500 MCG tablet Take 1,000 mcg by mouth in the morning. Take two tablets by mouth every morning.     furosemide  (LASIX ) 20 MG tablet Take 1 tablet (20 mg total) by mouth daily. Can take an additional tablet as needed for weight gain 45 tablet 1   lidocaine -prilocaine  (EMLA ) cream APPLY A QUARTER SIZE AMOUNT OF CREAM TO PORT - A- CATH 45 MINUTES TO 1 HOUR PRIOR TO PORT-A-CATH BEING ACCESS. *NEW PRESCRIPTION REQUEST* 90 g 11   loperamide  (IMODIUM ) 2 MG capsule Take 1 capsule (2 mg total) by mouth 4 (four) times daily as needed for diarrhea or loose stools. 12 capsule 0   metFORMIN  (GLUCOPHAGE -XR) 750 MG 24 hr tablet TAKE 2 TABLETS ONE TIME DAILY WITH BREAKFAST 180 tablet 2   metoprolol  tartrate (LOPRESSOR ) 25 MG tablet Take 1 tablet (25 mg total) by mouth 2 (two) times daily. 60 tablet 3   pantoprazole  (PROTONIX ) 20 MG tablet TAKE 1 TABLET BY MOUTH DAILY *NEW PRESCRIPTION REQUEST* 90 tablet 11   Polyethyl Glycol-Propyl Glycol (SYSTANE OP) Place 1 drop into both eyes 2 (two) times daily.      rivaroxaban  (XARELTO ) 20 MG TABS tablet Take 1 tablet (20 mg total) by mouth daily with supper. 30 tablet 0   traMADol  (ULTRAM ) 50 MG tablet Take 0.5-1 tablets (25-50 mg total) by mouth every 6 (six) hours as needed. 30 tablet 0   No current facility-administered medications for this visit.   Facility-Administered Medications Ordered in Other Visits  Medication Dose Route Frequency Provider Last Rate Last Admin   0.9 %  sodium chloride  infusion   Intravenous Continuous Lanny Callander, MD   Stopped at 12/11/23 1305   0.9 %  sodium chloride  infusion   Intravenous Continuous Lanny Callander, MD   Stopped at 12/11/23 1304    PHYSICAL EXAMINATION: Not performed   LABORATORY DATA:  I have reviewed the data as listed    Latest Ref Rng & Units 06/09/2024    9:11 AM 05/29/2024    4:35 AM 05/28/2024    4:50 AM  CBC  WBC 4.0  - 10.5 K/uL 6.5  8.1  9.6   Hemoglobin 12.0 - 15.0 g/dL 9.4  9.8  9.7   Hematocrit 36.0 - 46.0 % 31.1  31.4  31.0   Platelets 150 - 400 K/uL 343  335  326         Latest Ref Rng & Units 06/09/2024    9:11 AM 05/29/2024    4:35 AM 05/28/2024    4:50 AM  CMP  Glucose 70 - 99 mg/dL 777  874  884   BUN 8 - 23 mg/dL 17  10  7    Creatinine 0.44 - 1.00 mg/dL 9.15  9.24  9.43   Sodium 135 - 145 mmol/L 138  135  137   Potassium 3.5 - 5.1 mmol/L 3.6  3.3  3.3   Chloride 98 - 111 mmol/L 99  95  96   CO2 22 - 32 mmol/L 33  31  30   Calcium  8.9 - 10.3 mg/dL 9.5  9.6  9.3   Total Protein 6.5 - 8.1 g/dL 7.1   6.9   Total Bilirubin 0.0 - 1.2 mg/dL 0.3   0.7   Alkaline Phos 38 - 126 U/L 61   66   AST 15 - 41 U/L 13   19   ALT 0 - 44 U/L 8   16       RADIOGRAPHIC STUDIES: I have personally reviewed the radiological images as listed and agreed with the findings in the report. No results found.     I discussed the assessment and treatment plan with the patient. The patient was provided an opportunity to ask questions and all were answered. The patient agreed with the plan and demonstrated an understanding of the instructions.   The patient was advised to call back or seek an in-person evaluation if the symptoms worsen or if the condition fails to improve as anticipated.  I provided 15 minutes of non face-to-face telephone visit time during this encounter, including review of chart and various tests results, discussions about plan of care and coordination of care plan.    Onita Mattock, MD 08/04/24

## 2024-08-07 DIAGNOSIS — I480 Paroxysmal atrial fibrillation: Secondary | ICD-10-CM | POA: Diagnosis not present

## 2024-08-07 DIAGNOSIS — I350 Nonrheumatic aortic (valve) stenosis: Secondary | ICD-10-CM | POA: Diagnosis not present

## 2024-08-07 DIAGNOSIS — M1990 Unspecified osteoarthritis, unspecified site: Secondary | ICD-10-CM | POA: Diagnosis not present

## 2024-08-07 DIAGNOSIS — I10 Essential (primary) hypertension: Secondary | ICD-10-CM | POA: Diagnosis not present

## 2024-08-07 DIAGNOSIS — E1169 Type 2 diabetes mellitus with other specified complication: Secondary | ICD-10-CM | POA: Diagnosis not present

## 2024-08-07 DIAGNOSIS — E1165 Type 2 diabetes mellitus with hyperglycemia: Secondary | ICD-10-CM | POA: Diagnosis not present

## 2024-08-11 ENCOUNTER — Telehealth: Payer: Self-pay | Admitting: Nurse Practitioner

## 2024-08-11 NOTE — Telephone Encounter (Signed)
 I spoke with patient regarding scheduled appointment for lab and palliative care follow up on 09/01/2024. Patient aware of date/time.

## 2024-08-29 ENCOUNTER — Encounter (HOSPITAL_COMMUNITY): Payer: Self-pay

## 2024-08-29 ENCOUNTER — Other Ambulatory Visit: Payer: Self-pay

## 2024-08-29 ENCOUNTER — Emergency Department (HOSPITAL_COMMUNITY)
Admission: EM | Admit: 2024-08-29 | Discharge: 2024-08-29 | Disposition: A | Discharge status: Home / Self Care | Attending: Emergency Medicine | Admitting: Emergency Medicine

## 2024-08-29 ENCOUNTER — Emergency Department (HOSPITAL_COMMUNITY)

## 2024-08-29 DIAGNOSIS — Z7984 Long term (current) use of oral hypoglycemic drugs: Secondary | ICD-10-CM | POA: Diagnosis not present

## 2024-08-29 DIAGNOSIS — R011 Cardiac murmur, unspecified: Secondary | ICD-10-CM | POA: Insufficient documentation

## 2024-08-29 DIAGNOSIS — Z79899 Other long term (current) drug therapy: Secondary | ICD-10-CM | POA: Diagnosis not present

## 2024-08-29 DIAGNOSIS — D509 Iron deficiency anemia, unspecified: Secondary | ICD-10-CM | POA: Diagnosis not present

## 2024-08-29 DIAGNOSIS — I480 Paroxysmal atrial fibrillation: Secondary | ICD-10-CM | POA: Diagnosis not present

## 2024-08-29 DIAGNOSIS — E119 Type 2 diabetes mellitus without complications: Secondary | ICD-10-CM | POA: Insufficient documentation

## 2024-08-29 DIAGNOSIS — R0602 Shortness of breath: Secondary | ICD-10-CM | POA: Insufficient documentation

## 2024-08-29 DIAGNOSIS — I509 Heart failure, unspecified: Secondary | ICD-10-CM | POA: Diagnosis not present

## 2024-08-29 DIAGNOSIS — Z8507 Personal history of malignant neoplasm of pancreas: Secondary | ICD-10-CM | POA: Diagnosis not present

## 2024-08-29 DIAGNOSIS — Z7901 Long term (current) use of anticoagulants: Secondary | ICD-10-CM | POA: Insufficient documentation

## 2024-08-29 DIAGNOSIS — I110 Hypertensive heart disease with heart failure: Secondary | ICD-10-CM | POA: Insufficient documentation

## 2024-08-29 LAB — CBC
HCT: 31.4 % — ABNORMAL LOW (ref 36.0–46.0)
Hemoglobin: 9.5 g/dL — ABNORMAL LOW (ref 12.0–15.0)
MCH: 22.6 pg — ABNORMAL LOW (ref 26.0–34.0)
MCHC: 30.3 g/dL (ref 30.0–36.0)
MCV: 74.8 fL — ABNORMAL LOW (ref 80.0–100.0)
Platelets: 263 K/uL (ref 150–400)
RBC: 4.2 MIL/uL (ref 3.87–5.11)
RDW: 18.6 % — ABNORMAL HIGH (ref 11.5–15.5)
WBC: 6.3 K/uL (ref 4.0–10.5)
nRBC: 0 % (ref 0.0–0.2)

## 2024-08-29 LAB — BASIC METABOLIC PANEL WITH GFR
Anion gap: 12 (ref 5–15)
BUN: 14 mg/dL (ref 8–23)
CO2: 28 mmol/L (ref 22–32)
Calcium: 9.5 mg/dL (ref 8.9–10.3)
Chloride: 98 mmol/L (ref 98–111)
Creatinine, Ser: 1.08 mg/dL — ABNORMAL HIGH (ref 0.44–1.00)
GFR, Estimated: 51 mL/min — ABNORMAL LOW (ref >60)
Glucose, Bld: 155 mg/dL — ABNORMAL HIGH (ref 70–99)
Potassium: 3.5 mmol/L (ref 3.5–5.1)
Sodium: 138 mmol/L (ref 135–145)

## 2024-08-29 LAB — BRAIN NATRIURETIC PEPTIDE: B Natriuretic Peptide: 936.6 pg/mL — ABNORMAL HIGH (ref 0.0–100.0)

## 2024-08-29 NOTE — Discharge Instructions (Addendum)
 It was a pleasure taking care of you today.  Based on your history, physical exam, labs, as well as imaging I feel you are safe for discharge.  All studies look improved since your previous admission.  Please remain compliant with your outpatient medications including your Lasix .  Please make your cardiologist aware of your visit today and all findings.  If you experience any of the following symptoms including but not limited to fever, chills, worsening shortness of breath, chest pain, lower extremity swelling, unexpected weight gain, unexpected weakness, falls, or other concerning symptom please return to the emergency department or seek further medical care.  Please try to follow-up with cardiology within the next 1 to 2 weeks regarding your visit today. Please also make your primary care aware of your visit today.  If symptoms worsen recommend follow-up within 48 hours.

## 2024-08-29 NOTE — ED Triage Notes (Signed)
 Pt c.o Sob since last night, worse when she lays down. Denies chest pain, leg swelling, cough or congestion. Resp e.u

## 2024-08-29 NOTE — ED Notes (Signed)
 Patient ambulated 118ft with shuffled gait. Oxygen saturated during ambulation remained 96% and above. Denies any increased SOB.

## 2024-08-29 NOTE — ED Provider Notes (Signed)
 [place] EMERGENCY DEPARTMENT AT [facility] Provider Note   CSN: [id number] Arrival date & time: 08/29/24  9057     Patient presents with: Shortness of Breath   Janet Williamson is a 83 y.o. female who presents to the emergency department with acute complaint of shortness of breath.  Patient has a extensive past medical history including heart failure as well as pancreatic cancer.  Patient reports that last night when laying flat trying to go to sleep she began to feel slightly short of breath and had to take deep breaths.  Patient currently denies shortness of breath at rest.  Denies fever, chills, cough, or congestion.  Denies known sick contacts.  Denies feet swelling.  Patient states that she has been compliant with her medication and weighs herself daily.  Patient does not appreciate significant weight gain and states that she gains weight and then loses it.  Past medical history significant for hypertension, anemia, paroxysmal atrial fibrillation on Xarelto , status post TAVR, type 2 diabetes, stenosis of prosthetic aortic valve, acute congestive heart failure, etc. Denies chest pain.     Shortness of Breath      Prior to Admission medications   Medication Sig Start Date End Date Taking? Authorizing Provider  acetaminophen  (TYLENOL ) 500 MG tablet Take 1,000 mg by mouth every 8 (eight) hours as needed for mild pain (pain score 1-3) (for headaches).    [provider]  amiodarone  (PACERONE ) 400 MG tablet Take 1 tablet (400 mg total) by mouth 2 (two) times daily for 7 days, THEN 0.5 tablets (200 mg total) 2 (two) times daily. 05/29/24 07/05/24  Lue Elsie BROCKS, MD  baclofen  (LIORESAL ) 10 MG tablet Take 1 tablet (10 mg total) by mouth 3 (three) times daily. 03/11/24   Pickenpack-Cousar, Fannie SAILOR, NP  Cholecalciferol  (VITAMIN D3) 5000 units CAPS Take 5,000 Units by mouth daily.    [provider]  CREON  36000-114000 units CPEP capsule TAKE TWO (2)  CAPSULES BY MOUTH THREE TIMES A DAY WITH MEALS TAKE 1 CAPSULE BY MOUTH AS NEEDED WITH SNACK. MAX 10/DAY *NEW PRESCRIPTION REQUEST* 02/28/24   Hanford Powell BRAVO, NP  cyanocobalamin  (VITAMIN B12) 500 MCG tablet Take 1,000 mcg by mouth in the morning. Take two tablets by mouth every morning.    [provider]  furosemide  (LASIX ) 20 MG tablet Take 1 tablet (20 mg total) by mouth daily. Can take an additional tablet as needed for weight gain 06/11/24   Wyn Jackee VEAR Mickey., NP  lidocaine -prilocaine  (EMLA ) cream APPLY A QUARTER SIZE AMOUNT OF CREAM TO PORT - A- CATH 45 MINUTES TO 1 HOUR PRIOR TO PORT-A-CATH BEING ACCESS. *NEW PRESCRIPTION REQUEST* 02/28/24   Hanford Powell BRAVO, NP  loperamide  (IMODIUM ) 2 MG capsule Take 1 capsule (2 mg total) by mouth 4 (four) times daily as needed for diarrhea or loose stools. 04/05/24   Charlyn Sora, MD  metFORMIN  (GLUCOPHAGE -XR) 750 MG 24 hr tablet TAKE 2 TABLETS ONE TIME DAILY WITH BREAKFAST 11/27/23   Thapa, Sudan, MD  metoprolol  tartrate (LOPRESSOR ) 25 MG tablet Take 1 tablet (25 mg total) by mouth 2 (two) times daily. 02/18/24   Patsy Lenis, MD  pantoprazole  (PROTONIX ) 20 MG tablet TAKE 1 TABLET BY MOUTH DAILY *NEW PRESCRIPTION REQUEST* 02/28/24   Boscia, Heather E, NP  Polyethyl Glycol-Propyl Glycol (SYSTANE OP) Place 1 drop into both eyes 2 (two) times daily.     [provider]  rivaroxaban  (XARELTO ) 20 MG TABS tablet Take 1 tablet (20 mg  total) by mouth daily with supper. 02/09/24   Patt Alm Macho, MD  traMADol  (ULTRAM ) 50 MG tablet Take 0.5-1 tablets (25-50 mg total) by mouth every 6 (six) hours as needed. 07/28/24   Pickenpack-Cousar, Athena N, NP    Allergies: Ace inhibitors, Keflex [cephalexin], and Rifadin [rifampin]    Review of Systems  Respiratory:  Positive for shortness of breath.     Updated Vital Signs BP (!) 150/67 (BP Location: Left Arm)   Pulse 64   Temp 98.1 F (36.7 C)   Resp 16   Ht 5' 3 (1.6 m)   Wt 63.5 kg   SpO2  100%   BMI 24.80 kg/m   Physical Exam Vitals and nursing note reviewed.  Constitutional:      General: She is awake. She is not in acute distress.    Appearance: Normal appearance. She is well-developed. She is not ill-appearing, toxic-appearing or diaphoretic.  HENT:     Head: Normocephalic and atraumatic.  Neck:     Vascular: No JVD.  Cardiovascular:     Rate and Rhythm: Normal rate and regular rhythm.     Heart sounds: Murmur heard.  Pulmonary:     Effort: Pulmonary effort is normal. No tachypnea or respiratory distress.     Breath sounds: No stridor. No decreased breath sounds, wheezing, rhonchi or rales.     Comments: Patient talking in full sentences on room air Musculoskeletal:     Right lower leg: No edema.     Left lower leg: No edema.  Skin:    General: Skin is warm.     Capillary Refill: Capillary refill takes less than 2 seconds.  Neurological:     General: No focal deficit present.     Mental Status: She is alert and oriented to person, place, and time.  Psychiatric:        Mood and Affect: Mood normal.        Behavior: Behavior normal. Behavior is cooperative.     (all labs ordered are listed, but only abnormal results are displayed) Labs Reviewed  BASIC METABOLIC PANEL WITH GFR - Abnormal; Notable for the following components:      Result Value   Glucose, Bld 155 (*)    Creatinine, Ser 1.08 (*)    GFR, Estimated 51 (*)    All other components within normal limits  CBC - Abnormal; Notable for the following components:   Hemoglobin 9.5 (*)    HCT 31.4 (*)    MCV 74.8 (*)    MCH 22.6 (*)    RDW 18.6 (*)    All other components within normal limits  BRAIN NATRIURETIC PEPTIDE - Abnormal; Notable for the following components:   B Natriuretic Peptide 936.6 (*)    All other components within normal limits    EKG: EKG Interpretation Date/Time:  Saturday August 29 2024 09:50:22 EST Ventricular Rate:  72 PR Interval:  184 QRS Duration:  156 QT  Interval:  454 QTC Calculation: 497 R Axis:   -14  Text Interpretation: Normal sinus rhythm Left bundle branch block Abnormal ECG When compared with ECG of 11-Jun-2024 08:38, PREVIOUS ECG IS PRESENT Confirmed by Laurice Coy [phone]) on 08/29/2024 11:59:16 AM  Radiology: [facility] Chest 2 View Result Date: 08/29/2024 CLINICAL DATA:  SOB EXAM: CHEST - 2 VIEW COMPARISON:  May 27, 2024 FINDINGS: The cardiomediastinal silhouette is unchanged and enlarged in contour.Status post TAVR. Atherosclerotic calcifications of the aorta. RIGHT chest port with tip terminating over the inferior SVC.  Trace pleural effusions. No pneumothorax. Overall improvement in diffuse interstitial prominence compared to prior. There is mild persistent diffuse interstitial prominence noted. Biliary stent. Multilevel degenerative changes of the thoracic spine. IMPRESSION: Overall improvement in pulmonary edema since August 2025. There is persistent mild pulmonary edema. Electronically Signed   By: Corean Salter M.D.   On: 08/29/2024 10:48     Procedures   Medications Ordered in the ED - No data to display                                  Medical Decision Making Amount and/or Complexity of Data Reviewed Labs: ordered. Radiology: ordered.   Patient presents to the ED for concern of shortness of breath, this involves an extensive number of treatment options, and is a complaint that carries with it a high risk of complications and morbidity.  The differential diagnosis includes heart failure exacerbation, ACS, pneumothorax, pleural effusions due to malignancy, viral syndrome, pneumonia, etc.   Co morbidities that complicate the patient evaluation  hypertension, anemia, paroxysmal atrial fibrillation on Xarelto , status post TAVR, type 2 diabetes, stenosis of prosthetic aortic valve, acute congestive heart failure   Additional history obtained:  Additional history obtained from chart including previous lab values  were patient was admitted for new onset A-fib RVR as well as acute heart failure exacerbation, labs today look improved, also reviewed most recent cardiology note from approximately 2 months ago   Lab Tests:  I Ordered, and personally interpreted labs.  The pertinent results include: CBC reassuring, microcytic anemia with a hemoglobin of 9.5 which seems about baseline according to previous labs, creatinine 1.08 with an estimated GFR of 51, BNP 936   Imaging Studies ordered:  I ordered imaging studies including chest x-ray I independently visualized and interpreted imaging which showed improvement in pulmonary edema since August 2025, mild persistent pulmonary edema I agree with the radiologist interpretation   Medicines ordered and prescription drug management:  I have reviewed the patients home medicines and have made adjustments as needed   Test Considered:  CT PE study: Deferred at this time as patient is not tachycardic, patient states she has been compliant with her outpatient Xarelto  therapy so low clinical suspicion for PE, patient also states that shortness of breath is exertional and worse when laying flat   Critical Interventions:  None   Problem List / ED Course:  83 year old female, vital signs stable, presents emergency department with chief complaint of shortness of breath worse with exertion as well as laying flat, history of heart failure states that she has been compliant with her Lasix  therapy also on Xarelto  for paroxysmal atrial fibrillation which she has been compliant with On physical exam no JVD appreciated, no wheezing, rhonchi, Rales, no respiratory distress, no edema bilaterally of lower extremities, patient denies shortness of breath when laying in bed General lab work obtained, lab work reassuring compared to previous visit where patient was ultimately admitted for heart failure exacerbation, chest x-ray also shows improvement in pulmonary edema Per  nursing staff patient ambulated approximately 150 feet with shuffling gait maintaining oxygen saturation above 96% at all times Reassessed patient and patient continues to deny shortness of breath Clinically at this time I see no indication for admission to the hospital as this does not appear to be a decompensated acute heart failure exacerbation, labs and imaging are reassuring compared to previous and actually improved, patient denies acute weight gain  and currently denies shortness of breath at rest Instructed patient to remain compliant with her outpatient medications and continue to weigh herself, instructed her to follow-up with primary care provider as well as cardiology and make them aware of visit and all findings today Suspect that shortness of breath last night may have been due to mild persistent pulmonary edema however no indication for admission as this appears to be improving with outpatient therapy, no sign of acute infectious cause   Reevaluation:  After the interventions noted above, I reevaluated the patient and found that they have :improved   Social Determinants of Health:  None   Dispostion:  After consideration of the diagnostic results and the patients response to treatment, I feel that the patent would benefit from discharge and outpatient therapies described, follow-up with cardiology.     Final diagnoses:  Shortness of breath    ED Discharge Orders     None          Janetta Terrall JULIANNA DEVONNA 08/29/24 1549    Laurice Maude BROCKS, MD 08/30/24 [phone]

## 2024-09-01 ENCOUNTER — Inpatient Hospital Stay: Admitting: Nurse Practitioner

## 2024-09-01 ENCOUNTER — Inpatient Hospital Stay

## 2024-09-01 ENCOUNTER — Encounter: Payer: Self-pay | Admitting: Nurse Practitioner

## 2024-09-01 ENCOUNTER — Inpatient Hospital Stay: Attending: Nurse Practitioner

## 2024-09-01 VITALS — BP 152/49 | HR 67 | Temp 98.0°F | Resp 18 | Wt 145.1 lb

## 2024-09-01 DIAGNOSIS — E785 Hyperlipidemia, unspecified: Secondary | ICD-10-CM | POA: Insufficient documentation

## 2024-09-01 DIAGNOSIS — I10 Essential (primary) hypertension: Secondary | ICD-10-CM | POA: Insufficient documentation

## 2024-09-01 DIAGNOSIS — Z7984 Long term (current) use of oral hypoglycemic drugs: Secondary | ICD-10-CM | POA: Diagnosis not present

## 2024-09-01 DIAGNOSIS — Z515 Encounter for palliative care: Secondary | ICD-10-CM

## 2024-09-01 DIAGNOSIS — Z79899 Other long term (current) drug therapy: Secondary | ICD-10-CM | POA: Diagnosis not present

## 2024-09-01 DIAGNOSIS — Z7901 Long term (current) use of anticoagulants: Secondary | ICD-10-CM | POA: Diagnosis not present

## 2024-09-01 DIAGNOSIS — I480 Paroxysmal atrial fibrillation: Secondary | ICD-10-CM | POA: Diagnosis not present

## 2024-09-01 DIAGNOSIS — R530 Neoplastic (malignant) related fatigue: Secondary | ICD-10-CM

## 2024-09-01 DIAGNOSIS — E119 Type 2 diabetes mellitus without complications: Secondary | ICD-10-CM | POA: Diagnosis not present

## 2024-09-01 DIAGNOSIS — E039 Hypothyroidism, unspecified: Secondary | ICD-10-CM | POA: Insufficient documentation

## 2024-09-01 DIAGNOSIS — C250 Malignant neoplasm of head of pancreas: Secondary | ICD-10-CM

## 2024-09-01 DIAGNOSIS — G893 Neoplasm related pain (acute) (chronic): Secondary | ICD-10-CM | POA: Insufficient documentation

## 2024-09-01 LAB — CBC WITH DIFFERENTIAL (CANCER CENTER ONLY)
Abs Immature Granulocytes: 0.02 K/uL (ref 0.00–0.07)
Basophils Absolute: 0.1 K/uL (ref 0.0–0.1)
Basophils Relative: 1 %
Eosinophils Absolute: 0.2 K/uL (ref 0.0–0.5)
Eosinophils Relative: 3 %
HCT: 29.4 % — ABNORMAL LOW (ref 36.0–46.0)
Hemoglobin: 9 g/dL — ABNORMAL LOW (ref 12.0–15.0)
Immature Granulocytes: 0 %
Lymphocytes Relative: 28 %
Lymphs Abs: 1.7 K/uL (ref 0.7–4.0)
MCH: 22.5 pg — ABNORMAL LOW (ref 26.0–34.0)
MCHC: 30.6 g/dL (ref 30.0–36.0)
MCV: 73.5 fL — ABNORMAL LOW (ref 80.0–100.0)
Monocytes Absolute: 0.4 K/uL (ref 0.1–1.0)
Monocytes Relative: 7 %
Neutro Abs: 3.9 K/uL (ref 1.7–7.7)
Neutrophils Relative %: 61 %
Platelet Count: 280 K/uL (ref 150–400)
RBC: 4 MIL/uL (ref 3.87–5.11)
RDW: 18.3 % — ABNORMAL HIGH (ref 11.5–15.5)
WBC Count: 6.3 K/uL (ref 4.0–10.5)
nRBC: 0 % (ref 0.0–0.2)

## 2024-09-01 LAB — CMP (CANCER CENTER ONLY)
ALT: 8 U/L (ref 0–44)
AST: 16 U/L (ref 15–41)
Albumin: 3.8 g/dL (ref 3.5–5.0)
Alkaline Phosphatase: 68 U/L (ref 38–126)
Anion gap: 8 (ref 5–15)
BUN: 13 mg/dL (ref 8–23)
CO2: 30 mmol/L (ref 22–32)
Calcium: 9.8 mg/dL (ref 8.9–10.3)
Chloride: 99 mmol/L (ref 98–111)
Creatinine: 0.65 mg/dL (ref 0.44–1.00)
GFR, Estimated: >60 mL/min (ref >60)
Glucose, Bld: 154 mg/dL — ABNORMAL HIGH (ref 70–99)
Potassium: 3.8 mmol/L (ref 3.5–5.1)
Sodium: 136 mmol/L (ref 135–145)
Total Bilirubin: 0.3 mg/dL (ref 0.0–1.2)
Total Protein: 7.4 g/dL (ref 6.5–8.1)

## 2024-09-01 NOTE — Progress Notes (Signed)
 Palliative Medicine [facility]  [phone] [phone]   Name: Janet Williamson Date: 09/01/2024 MRN: [id number]  DOB: 08/16/1941  Patient Care Team: Leonel Cole, MD as PCP - General (Family Medicine) Jeffrie Oneil BROCKS, MD as PCP - Cardiology (Cardiology) Lanny Callander, MD as Consulting Physician (Hematology) Burnette Fallow, MD as Consulting Physician (Gastroenterology) Rosalie Kitchens, MD as Consulting Physician (Gastroenterology) Pickenpack-Cousar, Fannie SAILOR, NP as Nurse Practitioner (Hospice and Palliative Medicine)   INTERVAL HISTORY: Janet Williamson is a 83 y.o. female with  oncologic medical history including pancreatic cancer (08/2023) not a further candidate for further treatment due to age and recent hospitalization for endorcarditis (02/2024).  Palliative is seeing patient for symptom management and goals of care.   SOCIAL HISTORY:     reports that she has never smoked. She has never used smokeless tobacco. She reports that she does not drink alcohol and does not use drugs.  ADVANCE DIRECTIVES:  None on file  CODE STATUS: DNR  PAST MEDICAL HISTORY: 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 Not seen on most recent TEE 2019.   Cardiomyopathy in other disease    Cataracts, bilateral    Diabetes mellitus    Type 2   Generalized osteoarthritis    GERD (gastroesophageal reflux disease)    HLD (hyperlipidemia)    Hypertension    Hypothyroidism    LBBB (left bundle branch block)    PAF (paroxysmal atrial fibrillation) (HCC)    a. s/p DCCV 06/2017, on Xarelto    Phlebitis    S/P TAVR (transcatheter aortic valve replacement) 04/15/2018   Edwards Sapien 3 THV (size 23 mm, model # 9600TFX, serial # [id number]) via the TF approach   Severe aortic stenosis    a. s/p TAVR 04/2018.   Stroke [facility]) 2008    ALLERGIES:  is allergic to ace  inhibitors, keflex [cephalexin], and rifadin [rifampin].  MEDICATIONS:  Current Outpatient Medications  Medication Sig Dispense Refill   acetaminophen  (TYLENOL ) 500 MG tablet Take 1,000 mg by mouth every 8 (eight) hours as needed for mild pain (pain score 1-3) (for headaches).     amiodarone  (PACERONE ) 400 MG tablet Take 1 tablet (400 mg total) by mouth 2 (two) times daily for 7 days, THEN 0.5 tablets (200 mg total) 2 (two) times daily. 44 tablet 0   baclofen  (LIORESAL ) 10 MG tablet Take 1 tablet (10 mg total) by mouth 3 (three) times daily. 30 each 0   Cholecalciferol  (VITAMIN D3) 5000 units CAPS Take 5,000 Units by mouth daily.     CREON  36000-114000 units CPEP capsule TAKE TWO (2) CAPSULES BY MOUTH THREE TIMES A DAY WITH MEALS TAKE 1 CAPSULE BY MOUTH AS NEEDED WITH SNACK. MAX 10/DAY *NEW PRESCRIPTION REQUEST* 900 capsule 11   cyanocobalamin  (VITAMIN B12) 500 MCG tablet Take 1,000 mcg by mouth in the morning. Take two tablets by mouth every morning.     furosemide  (LASIX ) 20 MG tablet Take 1 tablet (20 mg total) by mouth daily. Can take an additional tablet as needed for weight gain 45 tablet 1   lidocaine -prilocaine  (EMLA ) cream APPLY A QUARTER SIZE AMOUNT OF CREAM TO PORT - A- CATH 45 MINUTES TO 1 HOUR PRIOR TO PORT-A-CATH BEING ACCESS. *NEW PRESCRIPTION REQUEST* 90 g 11   loperamide  (IMODIUM ) 2 MG capsule Take 1 capsule (2 mg total) by mouth  4 (four) times daily as needed for diarrhea or loose stools. 12 capsule 0   metFORMIN  (GLUCOPHAGE -XR) 750 MG 24 hr tablet TAKE 2 TABLETS ONE TIME DAILY WITH BREAKFAST 180 tablet 2   metoprolol  tartrate (LOPRESSOR ) 25 MG tablet Take 1 tablet (25 mg total) by mouth 2 (two) times daily. 60 tablet 3   pantoprazole  (PROTONIX ) 20 MG tablet TAKE 1 TABLET BY MOUTH DAILY *NEW PRESCRIPTION REQUEST* 90 tablet 11   Polyethyl Glycol-Propyl Glycol (SYSTANE OP) Place 1 drop into both eyes 2 (two) times daily.      rivaroxaban  (XARELTO ) 20 MG TABS tablet Take 1 tablet (20  mg total) by mouth daily with supper. 30 tablet 0   traMADol  (ULTRAM ) 50 MG tablet Take 0.5-1 tablets (25-50 mg total) by mouth every 6 (six) hours as needed. 30 tablet 0   No current facility-administered medications for this visit.   Facility-Administered Medications Ordered in Other Visits  Medication Dose Route Frequency Provider Last Rate Last Admin   0.9 %  sodium chloride  infusion   Intravenous Continuous Lanny Callander, MD   Stopped at 12/11/23 1305   0.9 %  sodium chloride  infusion   Intravenous Continuous Lanny Callander, MD   Stopped at 12/11/23 1304    VITAL SIGNS: BP (!) 152/49 (BP Location: Left Arm, Patient Position: Sitting, Cuff Size: Normal)   Pulse 67   Temp 98 F (36.7 C) (Temporal)   Resp 18   Wt 145 lb 1.6 oz (65.8 kg)   SpO2 98%   BMI 25.70 kg/m  Filed Weights   09/01/24 0943  Weight: 145 lb 1.6 oz (65.8 kg)     Estimated body mass index is 25.7 kg/m as calculated from the following:   Height as of 08/29/24: 5' 3 (1.6 m).   Weight as of this encounter: 145 lb 1.6 oz (65.8 kg).   PERFORMANCE STATUS (ECOG) : 1 - Symptomatic but completely ambulatory  IMPRESSION: Discussed the use of AI scribe software for clinical note transcription with the patient, who gave verbal consent to proceed.  History of Present Illness Janet Williamson is an 82 year old female with pancreatic cancer who presented to the clinic for follow-up.  No acute distress noted. Patient is ambulatory with a cane.  States she is doing well overall. No family present.   She experienced shortness of breath on November 22nd, prompting an emergency room visit. Since then, her symptoms have improved significantly, and she reports no current shortness of breath.  She occasionally experiences mild gastrointestinal discomfort after eating certain foods, which she manages with medication for upset stomach as needed. This effectively alleviates the pain. She does not use regular medication.  Her sleep  quality is good, and she feels her appetite is adequate. She notes an increase in energy levels when active outside, attributing this to being in the sunlight. She uses a cane for mobility when going out. Her current weight is up to 145lbs from 140lbs.   No current pain is reported, and she confirms good sleep and appetite, with an increase in energy when active outside.  We will continue to closely monitor and support.   Goals of care 03/11/2024:We discussed her current illness and what it means in the larger context of her on-going co-morbidities. Natural disease trajectory and expectations were discussed. Ms. Millspaugh and her granddaughter are realistic in their understanding of her terminal cancer with no further treatment options.    She is clear in expressed wishes to continue to treat  the treatable and aggressively manage her symptoms at this time. I empathetically approached discussions regarding healthcare limitations.    Education provided on the differences, goals and philosophy, and what care would look like with palliative versus hospice. Ms. Frayne is clear that she is not interested in pursuing hospice care at this time. She shares she almost did not want to come to her appointment today with myself out of fear she was being enrolled in hospice. I had open discussion with patient sharing at a point hospice would be appropriate for her care to allow her to spend what time she has left comfortable amongst her family. She verbalized understanding and appreciation.    Patient is a DNR/DNI as confirmed. We will continue ongoing goals of care discussions as needed.   I discussed the importance of continued conversation with family and their medical providers regarding overall plan of care and treatment options, ensuring decisions are within the context of the patients values and GOCs. Assessment & Plan Cancer related pain and lower back and leg Pain is well-controlled with current regimen  of Tylenol  and occasional tramadol  for moderate to severe pain. - Continue Tylenol  as needed. - Use tramadol  for moderate to severe pain as needed.  Goals of Care Patient and family expressed clear understanding of terminal pancreatic cancer. They are currently not interested in enrolling in hospice.  -Goals clear to continue treating the treatable allowing patient an opportunity to continue thriving while aggressively managing symptoms.  -DNR/DNI -Direct discussion regarding need for hospice in the future as health further declines.  -We will continue goals of care discussions offering support to patient and family as they navigate complex decisions.   Vascular access device maintenance (port flush) Requires regular port flushes to maintain vascular access device functionality. - Scheduled port flush in six weeks.  History of shortness of breath, resolved Shortness of breath resolved since emergency room visit on November 22nd. No current symptoms reported.  Follow-Up - I will see patient in clinic 4-6 weeks. Sooner if needed.    Patient expressed understanding and was in agreement with this plan. She also understands that She can call the clinic at any time with any questions, concerns, or complaints.   Any controlled substances utilized were prescribed in the context of palliative care. PDMP has been reviewed.   Visit consisted of counseling and education dealing with the complex and emotionally intense issues of symptom management and palliative care in the setting of serious and potentially life-threatening illness.  Levon Borer, AGPCNP-BC  Palliative Medicine Team/[facility]

## 2024-09-02 LAB — CANCER ANTIGEN 19-9: CA 19-9: <2 U/mL (ref 0–35)

## 2024-09-08 ENCOUNTER — Telehealth: Payer: Self-pay

## 2024-09-08 ENCOUNTER — Other Ambulatory Visit: Payer: Self-pay

## 2024-09-08 DIAGNOSIS — G893 Neoplasm related pain (acute) (chronic): Secondary | ICD-10-CM

## 2024-09-08 DIAGNOSIS — Z515 Encounter for palliative care: Secondary | ICD-10-CM

## 2024-09-08 DIAGNOSIS — C250 Malignant neoplasm of head of pancreas: Secondary | ICD-10-CM

## 2024-09-08 MED ORDER — TRAMADOL HCL 50 MG PO TABS: 25.0000 mg | ORAL_TABLET | Freq: Four times a day (QID) | ORAL | 0 refills | Status: AC | PRN

## 2024-09-08 NOTE — Telephone Encounter (Signed)
 Received notification that pt needed refill of tramadol  d/t abdominal pain. RN called pt, pt reports 5/10 pain since yesterday with no relief from tylenol . No nausea/vomiting/indigestion. Normal BMs with last BM yesterday and per pt it was her normal. Pt is also passing gas as normal. Pt reports pain is dull and achy and constant, pt reports that in the past she had used her tramadol  and it helped. Refill for tramadol  routed ot [place], NP. Pt verbalized understanding to call with any other questions or concerns.

## 2024-09-08 NOTE — Telephone Encounter (Signed)
 Pt's daughter called stating that the pt is having abdominal pain and doesn't have anything for pain.  Pt daughter stated the pain started about 2 days ago.  Asked pt's daughter if the pt was taking the Tramadol  that was prescribed to her by Palliative Care.  Pt's daughter was not sure if the pt was taking the Tramadol  but stated she would give the pt a call to see what the pt is taking for the pain.  Stated this nurse will make Dr Lanny and Palliative Care aware of the pt's daughter's call.

## 2024-09-14 ENCOUNTER — Encounter: Payer: Self-pay | Admitting: Podiatry

## 2024-09-14 ENCOUNTER — Ambulatory Visit (INDEPENDENT_AMBULATORY_CARE_PROVIDER_SITE_OTHER): Admitting: Podiatry

## 2024-09-14 VITALS — Ht 63.0 in | Wt 145.1 lb

## 2024-09-14 DIAGNOSIS — M79675 Pain in left toe(s): Secondary | ICD-10-CM | POA: Diagnosis not present

## 2024-09-14 DIAGNOSIS — M79674 Pain in right toe(s): Secondary | ICD-10-CM | POA: Diagnosis not present

## 2024-09-14 DIAGNOSIS — B351 Tinea unguium: Secondary | ICD-10-CM

## 2024-09-14 NOTE — Progress Notes (Signed)
   Chief Complaint  Patient presents with   Nail Problem    Pt is here for [facility].    SUBJECTIVE Patient presents to office today complaining of elongated, thickened nails that cause pain while ambulating in shoes.  Patient is unable to trim their own nails. Patient is here for further evaluation and treatment.  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 Not seen on most recent TEE 2019.   Cardiomyopathy in other disease    Cataracts, bilateral    Diabetes mellitus    Type 2   Generalized osteoarthritis    GERD (gastroesophageal reflux disease)    HLD (hyperlipidemia)    Hypertension    Hypothyroidism    LBBB (left bundle branch block)    PAF (paroxysmal atrial fibrillation) (HCC)    a. s/p DCCV 06/2017, on Xarelto    Phlebitis    S/P TAVR (transcatheter aortic valve replacement) 04/15/2018   Edwards Sapien 3 THV (size 23 mm, model # 9600TFX, serial # [id number]) via the TF approach   Severe aortic stenosis    a. s/p TAVR 04/2018.   Stroke [facility]) 2008    Allergies  Allergen Reactions   Ace Inhibitors Swelling and Other (See Comments)    Angioedema    Keflex [Cephalexin] Swelling and Other (See Comments)    Lips became swollen   Rifadin [Rifampin] Swelling and Other (See Comments)    Lips became swollen     OBJECTIVE General Patient is awake, alert, and oriented x 3 and in no acute distress. Derm Skin is dry and supple bilateral. Negative open lesions or macerations. Remaining integument unremarkable. Nails are tender, long, thickened and dystrophic with subungual debris, consistent with onychomycosis, 1-5 bilateral. No signs of infection noted. Vasc  DP and PT pedal pulses palpable bilaterally. Temperature gradient within normal limits.  Neuro light touch and protective threshold sensation grossly intact bilaterally.  Musculoskeletal Exam No symptomatic pedal deformities  noted bilateral. Muscular strength within normal limits.  ASSESSMENT 1.  Pain due to onychomycosis of toenails both  PLAN OF CARE 1. Patient evaluated today.  2. Instructed to maintain good pedal hygiene and foot care.  3. Mechanical debridement of nails 1-5 bilaterally performed using a nail nipper. Filed with dremel without incident.  4.  Comprehensive diabetic foot exam performed today  5.  Return to clinic in 3 mos. routine footcare   Thresa EMERSON Sar, DPM [facility]  Dr. Thresa EMERSON Sar, DPM    [address] [place], [place] [address]                Office [phone]  Fax [phone]

## 2024-09-18 ENCOUNTER — Other Ambulatory Visit: Payer: Self-pay | Admitting: Nurse Practitioner

## 2024-09-22 ENCOUNTER — Encounter: Payer: Self-pay | Admitting: Hematology

## 2024-09-22 ENCOUNTER — Emergency Department (HOSPITAL_COMMUNITY)
Admission: EM | Admit: 2024-09-22 | Discharge: 2024-10-08 | Disposition: E | Source: Home / Self Care | Attending: Emergency Medicine | Admitting: Emergency Medicine

## 2024-09-22 ENCOUNTER — Emergency Department (HOSPITAL_COMMUNITY)

## 2024-09-22 DIAGNOSIS — E872 Acidosis, unspecified: Secondary | ICD-10-CM

## 2024-09-22 DIAGNOSIS — R092 Respiratory arrest: Secondary | ICD-10-CM

## 2024-09-22 DIAGNOSIS — I6782 Cerebral ischemia: Secondary | ICD-10-CM | POA: Insufficient documentation

## 2024-09-22 DIAGNOSIS — I469 Cardiac arrest, cause unspecified: Secondary | ICD-10-CM

## 2024-09-22 DIAGNOSIS — J9601 Acute respiratory failure with hypoxia: Secondary | ICD-10-CM

## 2024-09-22 DIAGNOSIS — J189 Pneumonia, unspecified organism: Secondary | ICD-10-CM | POA: Diagnosis not present

## 2024-09-22 DIAGNOSIS — Z66 Do not resuscitate: Secondary | ICD-10-CM | POA: Diagnosis not present

## 2024-09-22 DIAGNOSIS — J9602 Acute respiratory failure with hypercapnia: Secondary | ICD-10-CM

## 2024-09-22 DIAGNOSIS — I5032 Chronic diastolic (congestive) heart failure: Secondary | ICD-10-CM | POA: Diagnosis not present

## 2024-09-22 DIAGNOSIS — G253 Myoclonus: Secondary | ICD-10-CM | POA: Diagnosis not present

## 2024-09-22 LAB — CBC WITH DIFFERENTIAL/PLATELET
Abs Immature Granulocytes: 0.1 K/uL — ABNORMAL HIGH (ref 0.00–0.07)
Basophils Absolute: 0.2 K/uL — ABNORMAL HIGH (ref 0.0–0.1)
Basophils Relative: 2 %
Eosinophils Absolute: 0.5 K/uL (ref 0.0–0.5)
Eosinophils Relative: 4 %
HCT: 32.8 % — ABNORMAL LOW (ref 36.0–46.0)
Hemoglobin: 9.2 g/dL — ABNORMAL LOW (ref 12.0–15.0)
Lymphocytes Relative: 53 %
Lymphs Abs: 6.3 K/uL — ABNORMAL HIGH (ref 0.7–4.0)
MCH: 22.4 pg — ABNORMAL LOW (ref 26.0–34.0)
MCHC: 28 g/dL — ABNORMAL LOW (ref 30.0–36.0)
MCV: 80 fL (ref 80.0–100.0)
Monocytes Absolute: 0.7 K/uL (ref 0.1–1.0)
Monocytes Relative: 6 %
Myelocytes: 1 %
Neutro Abs: 4 K/uL (ref 1.7–7.7)
Neutrophils Relative %: 34 %
Platelets: 277 K/uL (ref 150–400)
RBC: 4.1 MIL/uL (ref 3.87–5.11)
RDW: 17.7 % — ABNORMAL HIGH (ref 11.5–15.5)
Smear Review: NORMAL
WBC: 11.9 K/uL — ABNORMAL HIGH (ref 4.0–10.5)
nRBC: 0.9 % — ABNORMAL HIGH (ref 0.0–0.2)

## 2024-09-22 LAB — I-STAT CHEM 8, ED
BUN: 13 mg/dL (ref 8–23)
Calcium, Ion: 1.07 mmol/L — ABNORMAL LOW (ref 1.15–1.40)
Chloride: 101 mmol/L (ref 98–111)
Creatinine, Ser: 1 mg/dL (ref 0.44–1.00)
Glucose, Bld: 217 mg/dL — ABNORMAL HIGH (ref 70–99)
HCT: 29 % — ABNORMAL LOW (ref 36.0–46.0)
Hemoglobin: 9.9 g/dL — ABNORMAL LOW (ref 12.0–15.0)
Potassium: 4.5 mmol/L (ref 3.5–5.1)
Sodium: 138 mmol/L (ref 135–145)
TCO2: 21 mmol/L — ABNORMAL LOW (ref 22–32)

## 2024-09-22 LAB — I-STAT ARTERIAL BLOOD GAS, ED
Acid-base deficit: 13 mmol/L — ABNORMAL HIGH (ref 0.0–2.0)
Bicarbonate: 16.1 mmol/L — ABNORMAL LOW (ref 20.0–28.0)
Calcium, Ion: 1.15 mmol/L (ref 1.15–1.40)
HCT: 27 % — ABNORMAL LOW (ref 36.0–46.0)
Hemoglobin: 9.2 g/dL — ABNORMAL LOW (ref 12.0–15.0)
O2 Saturation: 100 %
Patient temperature: 36.5
Potassium: 3.7 mmol/L (ref 3.5–5.1)
Sodium: 137 mmol/L (ref 135–145)
TCO2: 18 mmol/L — ABNORMAL LOW (ref 22–32)
pCO2 arterial: 51 mmHg — ABNORMAL HIGH (ref 32–48)
pH, Arterial: 7.105 — CL (ref 7.35–7.45)
pO2, Arterial: 429 mmHg — ABNORMAL HIGH (ref 83–108)

## 2024-09-22 LAB — I-STAT VENOUS BLOOD GAS, ED
Acid-base deficit: 7 mmol/L — ABNORMAL HIGH (ref 0.0–2.0)
Bicarbonate: 21.5 mmol/L (ref 20.0–28.0)
Calcium, Ion: 1.06 mmol/L — ABNORMAL LOW (ref 1.15–1.40)
HCT: 29 % — ABNORMAL LOW (ref 36.0–46.0)
Hemoglobin: 9.9 g/dL — ABNORMAL LOW (ref 12.0–15.0)
O2 Saturation: 57 %
Potassium: 4.5 mmol/L (ref 3.5–5.1)
Sodium: 138 mmol/L (ref 135–145)
TCO2: 23 mmol/L (ref 22–32)
pCO2, Ven: 55.5 mmHg (ref 44–60)
pH, Ven: 7.196 — CL (ref 7.25–7.43)
pO2, Ven: 37 mmHg (ref 32–45)

## 2024-09-22 LAB — COMPREHENSIVE METABOLIC PANEL WITH GFR
ALT: 44 U/L (ref 0–44)
AST: 70 U/L — ABNORMAL HIGH (ref 15–41)
Albumin: 2.5 g/dL — ABNORMAL LOW (ref 3.5–5.0)
Alkaline Phosphatase: 70 U/L (ref 38–126)
Anion gap: 16 — ABNORMAL HIGH (ref 5–15)
BUN: 13 mg/dL (ref 8–23)
CO2: 23 mmol/L (ref 22–32)
Calcium: 8.5 mg/dL — ABNORMAL LOW (ref 8.9–10.3)
Chloride: 98 mmol/L (ref 98–111)
Creatinine, Ser: 0.99 mg/dL (ref 0.44–1.00)
GFR, Estimated: 57 mL/min — ABNORMAL LOW (ref >60)
Glucose, Bld: 257 mg/dL — ABNORMAL HIGH (ref 70–99)
Potassium: 4.6 mmol/L (ref 3.5–5.1)
Sodium: 137 mmol/L (ref 135–145)
Total Bilirubin: 0.5 mg/dL (ref 0.0–1.2)
Total Protein: 6.1 g/dL — ABNORMAL LOW (ref 6.5–8.1)

## 2024-09-22 LAB — PROTIME-INR
INR: 3.7 — ABNORMAL HIGH (ref 0.8–1.2)
Prothrombin Time: 38.2 s — ABNORMAL HIGH (ref 11.4–15.2)

## 2024-09-22 LAB — CBG MONITORING, ED: Glucose-Capillary: 146 mg/dL — ABNORMAL HIGH (ref 70–99)

## 2024-09-22 LAB — I-STAT CG4 LACTIC ACID, ED: Lactic Acid, Venous: 14.8 mmol/L (ref 0.5–1.9)

## 2024-09-22 LAB — TROPONIN I (HIGH SENSITIVITY): Troponin I (High Sensitivity): 161 ng/L (ref <18)

## 2024-09-22 MED ORDER — POLYVINYL ALCOHOL 1.4 % OP SOLN: 1.0000 [drp] | Freq: Four times a day (QID) | OPHTHALMIC | Status: DC | PRN

## 2024-09-22 MED ORDER — MORPHINE 100MG IN NS 100ML (1MG/ML) PREMIX INFUSION
0.0000 mg/h | INTRAVENOUS | Status: DC
  Administered 2024-09-22: 07:00:00 2 mg/h via INTRAVENOUS
  Filled 2024-09-22 (×2): qty 100

## 2024-09-22 MED ORDER — LEVOFLOXACIN IN D5W 750 MG/150ML IV SOLN
750.0000 mg | Freq: Once | INTRAVENOUS | Status: DC
  Filled 2024-09-22: qty 150

## 2024-09-22 MED ORDER — LEVETIRACETAM (KEPPRA) 500 MG/5 ML ADULT IV PUSH
3600.0000 mg | Freq: Once | INTRAVENOUS | Status: AC
  Administered 2024-09-22: 02:00:00 3500 mg via INTRAVENOUS
  Filled 2024-09-22: qty 35

## 2024-09-22 MED ORDER — EPINEPHRINE 1 MG/10ML IV SOSY
PREFILLED_SYRINGE | INTRAVENOUS | Status: AC | PRN
  Administered 2024-09-22 (×3): 1 mg via INTRAVENOUS
  Administered 2024-09-22: 01:00:00 .5 mg via INTRAVENOUS
  Administered 2024-09-22: 01:00:00 1 mg via INTRAVENOUS

## 2024-09-22 MED ORDER — SODIUM BICARBONATE 8.4 % IV SOLN
INTRAVENOUS | Status: AC | PRN
  Administered 2024-09-22: 01:00:00 50 meq via INTRAVENOUS

## 2024-09-22 MED ORDER — LORAZEPAM 2 MG/ML IJ SOLN: 2.0000 mg | INTRAMUSCULAR | Status: DC | PRN

## 2024-09-22 MED ORDER — GLYCOPYRROLATE 0.2 MG/ML IJ SOLN: 0.2000 mg | INTRAMUSCULAR | Status: DC | PRN

## 2024-09-22 MED ORDER — SODIUM CHLORIDE 0.9 % IV BOLUS
1000.0000 mL | Freq: Once | INTRAVENOUS | Status: AC
  Administered 2024-09-22: 02:00:00 1000 mL via INTRAVENOUS

## 2024-09-22 MED ORDER — VANCOMYCIN HCL IN DEXTROSE 1-5 GM/200ML-% IV SOLN
1000.0000 mg | Freq: Once | INTRAVENOUS | Status: DC
  Administered 2024-09-22: 02:00:00 1000 mg via INTRAVENOUS
  Filled 2024-09-22: qty 200

## 2024-09-22 MED ORDER — GLYCOPYRROLATE 1 MG PO TABS: 1.0000 mg | ORAL_TABLET | ORAL | Status: DC | PRN

## 2024-09-22 MED ORDER — ACETAMINOPHEN 325 MG PO TABS: 650.0000 mg | ORAL_TABLET | Freq: Four times a day (QID) | ORAL | Status: DC | PRN

## 2024-09-22 MED ORDER — SODIUM CHLORIDE 0.9 % IV BOLUS: 1000.0000 mL | Freq: Once | INTRAVENOUS | Status: DC

## 2024-09-22 MED ORDER — ACETAMINOPHEN 650 MG RE SUPP: 650.0000 mg | Freq: Four times a day (QID) | RECTAL | Status: DC | PRN

## 2024-09-22 MED ORDER — LORAZEPAM 2 MG/ML IJ SOLN
4.0000 mg | Freq: Once | INTRAMUSCULAR | Status: AC
  Administered 2024-09-22: 02:00:00 4 mg via INTRAVENOUS
  Filled 2024-09-22: qty 2

## 2024-09-22 MED ORDER — EPINEPHRINE HCL 5 MG/250ML IV SOLN IN NS
0.5000 ug/min | INTRAVENOUS | Status: DC
  Administered 2024-09-22: 01:00:00 10 ug/min via INTRAVENOUS

## 2024-09-22 MED ORDER — MORPHINE BOLUS VIA INFUSION
5.0000 mg | INTRAVENOUS | Status: DC | PRN
  Administered 2024-09-22: 07:00:00 5 mg via INTRAVENOUS

## 2024-09-22 MED ORDER — SODIUM CHLORIDE 0.9 % IV SOLN: INTRAVENOUS | Status: DC

## 2024-09-28 ENCOUNTER — Other Ambulatory Visit: Payer: Self-pay | Admitting: Hematology

## 2024-10-08 NOTE — ED Provider Notes (Signed)
 Care assumed from Dr. Griselda.  At time of transfer of care, patient has been extubated for comfort measures about 20 minutes ago.  Plan of care is to reevaluate patient to see if she will need admission to hospital service for end-of-life care or if she may end up expiring in the emergency department.  7:27 AM It has now been about 45 minutes since her intubation tube was pulled and she is still resting without distress.  Her heart rate is about 115 and her blood pressure is also over 110 systolic.  It does not look like she is going to expire quickly at this moment thus we will call for medical admission for end-of-life care.  Will call for unassigned admission.  I was informed by nursing that the patient's heart rate has slowed down precipitously and now appears to be in the 10-20 range and she is now apneic.  Will reassess for possible expiration  8:00 AM I went to reassess the patient and family at the bedside.  Patient did not have any spontaneous breathing and did not have a pulse.  She was not responding to any external stimuli and appears to be in PEA arrest.  With family present, time of death was declared at 7:59 AM.  Death certificate will be completed by Dr. Rolan Sharps with critical care who's team was involved in the decision to go with comfort measures as opposed to aggressive resuscitative measures at admission.   Clinical Impression: 1. Respiratory arrest (HCC)     Disposition: Expired  This note was prepared with assistance of Dragon voice recognition software. Occasional wrong-word or sound-a-like substitutions may have occurred due to the inherent limitations of voice recognition software.       Kaprice Kage, Lonni PARAS, MD 09/08/2024 [phone]

## 2024-10-08 NOTE — ED Triage Notes (Signed)
 Pt came in POV, pt was found to be pulseless when removed from private vehicle, compressions started and pt brought back to [facility].

## 2024-10-08 NOTE — ED Notes (Signed)
Pt cleaned up after bowel movement

## 2024-10-08 NOTE — Progress Notes (Signed)
 Pt transported on ventilator to CT and back w/o complication.

## 2024-10-08 NOTE — Consult Note (Signed)
 NAME:  Janet Williamson, MRN:  [id number], DOB:  December 25, 1940, LOS: 0 ADMISSION DATE:  09/27/2024, CONSULTATION DATE:  10/07/2024 REFERRING MD:  Griselda CHIEF COMPLAINT:  Cardiac Arrest   History of Present Illness:  Pt is encephelopathic; therefore, this HPI is obtained from chart review. Janet Williamson Haslem is a 84 y.o. female who has a PMH as below including but not limited to pancreatic CA T3N0 with questionable peritoneal carcinomatosis. Pancreatic tumor first noticed on imaging December 2024 and ultimately diagnosed in January 2025. She is no longer a chemo candidate due to age and a recent admission for endocarditis in May 2025. She is followed by palliative care but had not yet been ready for hospice.  She presented to [facility] ED 12/16 with cardiac arrest. She had knocked on her grandsons door stating that she couldn't breathe and that they couldn't wait for EMS or she wouldn't make it. Grandson drove her to ED and she became unresponsive en route. Upon arrival to ED, she was removed from the vehicle by staff and was noted to be pulseless. She had roughly 12 - 15 minutes of ACLS prior to ROSC. She was placed on an Epinephrine  infusion at 10 for BP and HR support. She was intubated for airway protection. She had a few seconds of facial twitching prior to CT head for which she received Ativan  and Keppra .  After arrival, her family including multiple children arrived at the bedside. I had a long discussion with them regarding her prior history, cancer treatments, current condition, and prognosis moving forward. They were in agreement that pt had already had a DNR outside of the hospital and that given the current circumstances and 12 - 15 minute arrest, she would not want to continue with heroic measures knowing that her prognosis for meaningful recovery is grim. They were in agreement to transition to full comfort measures in the ED without hospital admission.  Pertinent  Medical History:  does not have a  problem list on file.  Significant Hospital Events: Including procedures, antibiotic start and stop dates in addition to other pertinent events   12/16 presented to ED with cardiac arrest, transitioned to comfort   Interim History / Subjective:  Not responsive, on no sedation. Does breathe over vent. On 10 epi.  Objective:  Height 5' 3 (1.6 m), SpO2 100%.    Vent Mode: PRVC FiO2 (%):  [80 %-100 %] 80 % Set Rate:  [18 bmp-24 bmp] 24 bmp Vt Set:  [420 mL] 420 mL PEEP:  [5 cmH20-8 cmH20] 5 cmH20 Plateau Pressure:  [25 cmH20] 25 cmH20  No intake or output data in the 24 hours ending 09/17/2024 0324 There were no vitals filed for this visit.   Physical Exam: General: Adult female, critically ill. Neuro: Non responsive. HEENT: [place]/AT. Sclerae anicteric. ETT in place. Cardiovascular: RRR, no M/R/G.  Lungs: Respirations even and unlabored.  Coarse bilaterally. Abdomen: BS x 4, soft, NT/ND.  Musculoskeletal: No gross deformities, no edema.   Labs/imaging personally reviewed:  CT head 12/16 >  CXR 12/16 > multifocal PNA.  Assessment & Plan:   Cardiac Arrest - unclear etiology, suspect respiratory driven given multifocal PNA on CXR and hx that pt was having dyspnea prior to ED presentation. Acute hypoxic respiratory failure - 2/2 above, s/Williamson intubation. Lactic Acidosis - 2/2 above. Concern for seizures/myoclonus - 2/2 above. Coagulopathy. Hx pancreatic CA T3N0 with questionable peritoneal carcinomatosis - not a candidate for ongoing treatment given age and recent hospitalization for endocarditis May  2025. DNR/DNI.  Discussion: I had a long discussion with the family at the bedside regarding her prior history, cancer treatments, current condition, and prognosis moving forward. They were in agreement that pt had already had a DNR outside of the hospital and that given the current circumstances and 12 - 15 minute arrest, she would not want to continue with heroic measures knowing that  her prognosis for meaningful recovery is grim. They were in agreement to transition to full comfort measures in the ED without hospital admission. Comfort orders placed. EDP and RN updated. Emotional support and condolences offered.   Labs   CBC: Recent Labs  Lab 09/14/2024 0124 10/06/2024 0144  WBC 11.9*  --   NEUTROABS 4.0  --   HGB 9.2*  9.9*  9.9* 9.2*  HCT 32.8*  29.0*  29.0* 27.0*  MCV 80.0  --   PLT 277  --     Basic Metabolic Panel: Recent Labs  Lab 10/02/2024 0124 09/09/2024 0144  NA 137  138  138 137  K 4.6  4.5  4.5 3.7  CL 98  101  --   CO2 23  --   GLUCOSE 257*  217*  --   BUN 13  13  --   CREATININE 0.99  1.00  --   CALCIUM  8.5*  --    GFR: CrCl cannot be calculated (Unknown ideal weight.). Recent Labs  Lab 09/27/2024 0124 09/23/2024 0146  WBC 11.9*  --   LATICACIDVEN  --  14.8*    Liver Function Tests: Recent Labs  Lab 09/07/2024 0124  AST 70*  ALT 44  ALKPHOS 70  BILITOT 0.5  PROT 6.1*  ALBUMIN 2.5*   No results for input(s): LIPASE, AMYLASE in the last 168 hours. No results for input(s): AMMONIA in the last 168 hours.  ABG    Component Value Date/Time   PHART 7.105 (LL) 09/28/2024 0144   PCO2ART 51.0 (H) 09/07/2024 0144   PO2ART 429 (H) 09/30/2024 0144   HCO3 16.1 (L) 09/20/2024 0144   TCO2 18 (L) 09/30/2024 0144   ACIDBASEDEF 13.0 (H) 09/10/2024 0144   O2SAT 100 09/09/2024 0144     Coagulation Profile: Recent Labs  Lab 09/24/2024 0124  INR 3.7*    Cardiac Enzymes: No results for input(s): CKTOTAL, CKMB, CKMBINDEX, TROPONINI in the last 168 hours.  HbA1C: No results found for: HGBA1C  CBG: No results for input(s): GLUCAP in the last 168 hours.  Review of Systems:   Unable to obtain as pt is encephalopathic.  Past Medical History:  She,  has no past medical history on file.   Surgical History:  Not available.  Social History:      Family History:  Her family history is not on file.    Allergies Allergies[1]   Home Medications  Prior to Admission medications  Not on File     Critical care time: 30 min.   Sammi Gore, PA - [facility] For pager details, please see AMION or use Epic chat  After 1900, please call [facility] for cross coverage needs 09/09/2024, 3:24 AM       [1] Not on File

## 2024-10-08 NOTE — ED Notes (Signed)
 PT ceased respirating, no pulse noted from nurse Dr Ginger notified, Chaplain paged by secretary 5 min prior.

## 2024-10-08 NOTE — ED Notes (Signed)
 Morphine  wasted in bin with Sidra Masters RN r/t PT expiring and medication being Dc'd.

## 2024-10-08 NOTE — Progress Notes (Signed)
°   10/07/2024 0817  Spiritual Encounters  Type of Visit Follow up  Care provided to: Pt and family  Conversation partners present during encounter Nurse  Reason for visit Patient death  OnCall Visit Yes   Responded to call from nurse that patient was actively transitioning and family present.  Reported to trauma A and provided prayer and spiritual care to approximately 64 family members.  Patient transitioned as family prayed. Remained with family and offered support until family was ready to disburse. Daughter will call patient placement once it is determined which funeral home will service the family. They will meet at family home to discuss and make arrangements.

## 2024-10-08 NOTE — Procedures (Signed)
 Extubation Procedure Note  Patient Details:   Name: [name] DOB: 1941/08/24 MRN: [id number]   Airway Documentation:    Vent end date: 09/23/2024 Vent end time: 0640   Evaluation  O2 sats: transiently fell during during procedure Complications: No apparent complications Patient did tolerate procedure well. Bilateral Breath Sounds: Clear   No  Pt extubated to comfort care per MD order. RN and family at bedside throughout procedure.   Damien FORBES Rummer 09/15/2024, 6:42 AM

## 2024-10-08 NOTE — Consult Note (Signed)
 NEUROLOGY CONSULT NOTE   Date of service: 10/07/2024 Patient Name: Janet Williamson MRN:  [id number] DOB:  November 30, 1940 Chief Complaint: myoclonic jerks Requesting Provider: Griselda Norris, MD  History of Present Illness  Janet Williamson is a 84 y.o. female with hx of metastatic pancreatic cancer, chronic HFpEF, status post TAVR, atrial fibrillation, history of CVA, diabetes mellitus type 2 who is brought in to the ED by her family and pulseless when removed from her vehicle.  Per grandson, patient came to his door gasping for air and unable to breath. He drove her to the ED. She was in and out of consciousness and just as he turned to the ED, she was unresponsive. Staff pulled her from the car, started CPR. She was intubated in the ED on arrival.  She was noted to have facial and full body twitching concerning for myoclonic seizures. These were more prominent with stimulation.  Neurology consulted for further evaluation.   ROS  Unable to ascertain due to unresponsive.  Past History  No past medical history on file.    Family History: No family history on file.  Social History  has no history on file for tobacco use, alcohol  use, and drug use.  Allergies[1]  Medications  Current Medications[2]  Vitals   Vitals:   2024-10-07 0117 October 07, 2024 0158 2024-10-07 0252  SpO2: 90% 100% 100%  Height: 5' 3 (1.6 m)      There is no height or weight on file to calculate BMI.   Physical Exam   General: Laying comfortably in bed; intubated. HENT: Normal oropharynx and mucosa. Normal external appearance of ears and nose.  Neck: Supple, no pain or tenderness  CV: No JVD. No peripheral edema.  Pulmonary: Symmetric Chest rise. Not breathing over vent. Abdomen: Soft to touch, non-tender.  Ext: No cyanosis, edema, or deformity  Skin: No rash. Normal palpation of skin.   Musculoskeletal: Normal digits and nails by inspection. No clubbing.   Neurologic Examination  Mental  status/Cognition: obtunded, no response to voice or loud clap. Speech/language: mute, no speech, does not follow commands. Cranial nerves:   CN II Pupils are 3mm BL, equal and round but unreactive to light   CN III,IV,VI Dolls eyes reflex is absent.   CN V Corneals are absent bilaterally   CN VII Facial diplegia   CN VIII Does not make eye contact to speech.   CN IX & X No cough, no gag   CN XI Head is midline   CN XII Does not protrude tongue on command   Motor:  Muscle bulk: Poor, tone flaccid in all extremities with intermittent myoclonic jerks.  Noted to have intermittent myoclonic jerks involving face, torso arms and legs. No purposeful movement noted.  No response to proximal pinch in any of the extremities.  Coordination/Complex Motor:  Unable to assess.  Labs/Imaging/Neurodiagnostic studies   CBC:  Recent Labs  Lab 07-Oct-2024 0124 10/07/2024 0144  WBC 11.9*  --   NEUTROABS 4.0  --   HGB 9.2*  9.9*  9.9* 9.2*  HCT 32.8*  29.0*  29.0* 27.0*  MCV 80.0  --   PLT 277  --    Basic Metabolic Panel:  Lab Results  Component Value Date   NA 137 10/07/2024   K 3.7 10/07/2024   CO2 23 Oct 07, 2024   GLUCOSE 257 (H) 10/07/24   GLUCOSE 217 (H) 2024/10/07   BUN 13 Oct 07, 2024   BUN 13 Oct 07, 2024   CREATININE 0.99 Oct 07, 2024  CREATININE 1.00 09/20/2024   CALCIUM  8.5 (L) 09/26/2024   GFRNONAA 57 (L) 09/17/2024   Lipid Panel: No results found for: LDLCALC HgbA1c: No results found for: HGBA1C Urine Drug Screen: No results found for: LABOPIA, COCAINSCRNUR, LABBENZ, AMPHETMU, THCU, LABBARB  Alcohol  Level No results found for: [facility] INR  Lab Results  Component Value Date   INR 3.7 (H) 09/20/2024   APTT No results found for: APTT AED levels: No results found for: PHENYTOIN, ZONISAMIDE, LAMOTRIGINE, LEVETIRACETA  CT Head without contrast(Personally reviewed): CTH was negative for a large hypodensity concerning for a large territory infarct or  hyperdensity concerning for an ICH  MRI Brain(Personally reviewed): Pending  Neurodiagnostics cEEG:  Pending  ASSESSMENT   Janet Williamson is a 84 y.o. female with hx of metastatic pancreatic cancer, chronic HFpEF, status post TAVR, atrial fibrillation, history of CVA, diabetes mellitus type 2 who was pulled from her car at the ED front of a and was pulseless.  12 to 15 minutes of CPR before ROSC.  Patient was noted to have myoclonic jerks that are intermittent and more prominent with stimulation and concerning for myoclonic seizures.  She was given Ativan  4 mg and loaded with Keppra .  Neurology was consulted further vascular workup.  RECOMMENDATIONS  -Patient transition to comfort care.  Neurology will sign off.  Please feel free to contact us  with any questions or concerns. ______________________________________________________________________  Plan was discussed with Dr. Griselda with the ED team.  Signed, Rockford Leinen, MD [facility]     [1] Not on File [2]  Current Facility-Administered Medications:    0.9 %  sodium chloride  infusion, , Intravenous, Continuous, Desai, Rahul P, PA-C   acetaminophen  (TYLENOL ) tablet 650 mg, 650 mg, Oral, Q6H PRN **OR** acetaminophen  (TYLENOL ) suppository 650 mg, 650 mg, Rectal, Q6H PRN, Desai, Rahul P, PA-C   artificial tears ophthalmic solution 1 drop, 1 drop, Both Eyes, QID PRN, Desai, Rahul P, PA-C   glycopyrrolate  (ROBINUL ) tablet 1 mg, 1 mg, Oral, Q4H PRN **OR** glycopyrrolate  (ROBINUL ) injection 0.2 mg, 0.2 mg, Subcutaneous, Q4H PRN **OR** glycopyrrolate  (ROBINUL ) injection 0.2 mg, 0.2 mg, Intravenous, Q4H PRN, Desai, Rahul P, PA-C   LORazepam  (ATIVAN ) injection 2-4 mg, 2-4 mg, Intravenous, Q4H PRN, Desai, Rahul P, PA-C   morphine  100mg  in NS 100mL (1mg /mL) infusion - premix, 0-20 mg/hr, Intravenous, Continuous, Desai, Rahul P, PA-C   morphine  bolus via infusion 5 mg, 5 mg, Intravenous, Q5 min PRN, Desai, Rahul P, PA-C No  current outpatient medications on file.

## 2024-10-08 NOTE — Progress Notes (Addendum)
°   09/15/2024 0124  Spiritual Encounters  Type of Visit Initial  Care provided to: [facility] partners present during encounter Physician;Nurse  Reason for visit Trauma  OnCall Visit Yes   Patient lives with grandson. Per grandson patient banged on his bedroom door and asked to be taken to the hospital. Grandson notice difficulty in breathing. He asked patient ''Do you want me to call the ambulance?  He says she replied I won't make it if we wait for them. Grandson got patient in the car and drove her to the hospital. He says that by the time they arrived she wasn't breathing so he ran into ED and got help.   Provided support to grandson and daughter while interdisciplinary team provided care to patient.  Additional family on route. Need to determine what patient would want. Second daughter who aides in medical decisions is on her way.

## 2024-10-08 NOTE — ED Provider Notes (Signed)
°  INTUBATION Performed by: Olam CHRISTELLA [place]  Required items: required blood products, implants, devices, and special equipment available Patient identity confirmed: provided demographic data and hospital-assigned identification number Time out: Immediately prior to procedure a time out was called to verify the correct patient, procedure, equipment, support staff and site/side marked as required.  Indications: cardiac arrest  Intubation method: Glidescope Laryngoscopy   Preoxygenation: BVM  Sedatives: none Paralytic: none  Tube Size: 7.5 cuffed  Post-procedure assessment: chest rise and ETCO2 monitor Breath sounds: equal and absent over the epigastrium Tube secured with: ETT holder Chest x-ray interpreted by radiologist and me.  Chest x-ray findings: endotracheal tube in appropriate position  Patient tolerated the procedure well with no immediate complications.     [place] Olam CHRISTELLA, PA-C 09/23/2024 0226    Griselda Norris, MD 09/14/2024 [phone]

## 2024-10-08 NOTE — ED Provider Notes (Signed)
 Janet Williamson Provider Note   CSN: [id number] Arrival date & time: 09/12/2024  9887     Patient presents with: Unresponsive   Janet Williamson is a 84 y.o. female.   The history is provided by a relative and medical records.  Janet Williamson is a 84 y.o. female who presents to the Emergency Department complaining of unresponsive.  History is provided by the patient's grandson.  He brought her into the emergency department after she came to his door stating that she could not breathe and was gasping for air.  She lost consciousness when he arrived to the emergency department.  Staff pulled her out of the vehicle and she was pulseless and apneic and CPR was initiated.  She was placed on a stretcher and brought back to the resuscitation room.  She has a history of advanced pancreatic cancer.     Prior to Admission medications  Not on File    Allergies: Patient has no known allergies.    Review of Systems  Unable to perform ROS: Patient unresponsive    Updated Vital Signs BP (!) 116/51   Pulse 88   Resp (!) 24   Ht 5' 3 (1.6 m)   SpO2 100%   Physical Exam Vitals and nursing note reviewed.  Constitutional:      Appearance: She is ill-appearing.  HENT:     Head: Normocephalic and atraumatic.  Cardiovascular:     Comments: No spontaneous cardiac activity Pulmonary:     Comments: No spontaneous respiratory effort.  Good air movement bilaterally with bag-valve-mask ventilation Abdominal:     Comments: Distended abdomen  Musculoskeletal:        General: No swelling.  Skin:    General: Skin is warm and dry.     Capillary Refill: Capillary refill takes less than 2 seconds.  Neurological:     Comments: GCS 1-1-1.  Pupils fixed and dilated.      (all labs ordered are listed, but only abnormal results are displayed) Labs Reviewed  CBC WITH DIFFERENTIAL/PLATELET - Abnormal; Notable for the following components:      Result  Value   WBC 11.9 (*)    Hemoglobin 9.2 (*)    HCT 32.8 (*)    MCH 22.4 (*)    MCHC 28.0 (*)    RDW 17.7 (*)    nRBC 0.9 (*)    Lymphs Abs 6.3 (*)    Basophils Absolute 0.2 (*)    Abs Immature Granulocytes 0.10 (*)    All other components within normal limits  COMPREHENSIVE METABOLIC PANEL WITH GFR - Abnormal; Notable for the following components:   Glucose, Bld 257 (*)    Calcium  8.5 (*)    Total Protein 6.1 (*)    Albumin 2.5 (*)    AST 70 (*)    GFR, Estimated 57 (*)    Anion gap 16 (*)    All other components within normal limits  PROTIME-INR - Abnormal; Notable for the following components:   Prothrombin Time 38.2 (*)    INR 3.7 (*)    All other components within normal limits  I-STAT ARTERIAL BLOOD GAS, ED - Abnormal; Notable for the following components:   pH, Arterial 7.105 (*)    pCO2 arterial 51.0 (*)    pO2, Arterial 429 (*)    Bicarbonate 16.1 (*)    TCO2 18 (*)    Acid-base deficit 13.0 (*)    HCT 27.0 (*)  Hemoglobin 9.2 (*)    All other components within normal limits  I-STAT CHEM 8, ED - Abnormal; Notable for the following components:   Glucose, Bld 217 (*)    Calcium , Ion 1.07 (*)    TCO2 21 (*)    Hemoglobin 9.9 (*)    HCT 29.0 (*)    All other components within normal limits  I-STAT VENOUS BLOOD GAS, ED - Abnormal; Notable for the following components:   pH, Ven 7.196 (*)    Acid-base deficit 7.0 (*)    Calcium , Ion 1.06 (*)    HCT 29.0 (*)    Hemoglobin 9.9 (*)    All other components within normal limits  I-STAT CG4 LACTIC ACID, ED - Abnormal; Notable for the following components:   Lactic Acid, Venous 14.8 (*)    All other components within normal limits  TROPONIN I (HIGH SENSITIVITY) - Abnormal; Notable for the following components:   Troponin I (High Sensitivity) 161 (*)    All other components within normal limits  CBG MONITORING, ED    EKG: None  Radiology: CT Head Wo Contrast Result Date: 10/05/2024 EXAM: CT HEAD WITHOUT  10/03/2024 02:37:29 AM TECHNIQUE: CT of the head was performed without the administration of intravenous contrast. Automated exposure control, iterative reconstruction, and/or weight based adjustment of the mA/kV was utilized to reduce the radiation dose to as low as reasonably achievable. COMPARISON: None available. CLINICAL HISTORY: Mental status change, unknown cause; cardiac arrest, focal left facial twitching FINDINGS: BRAIN AND VENTRICLES: No acute intracranial hemorrhage. No mass effect or midline shift. No extra-axial fluid collection. No evidence of acute infarct. No hydrocephalus. Mild chronic microvascular ischemic disease with a few small remote lacunar infarcts about the right basal ganglia and left thalamus. Atherosclerotic calcifications of carotid siphons. ORBITS: No acute abnormality. Bilateral lens replacement. SINUSES AND MASTOIDS: Mucosal thickening in right > left sphenoid sinuses. SOFT TISSUES AND SKULL: No acute skull fracture. No acute soft tissue abnormality. IMPRESSION: 1. No acute intracranial abnormality. 2. Mild chronic microvascular ischemic disease with a few small remote lacunar infarcts about the right basal ganglia and left thalamus. Electronically signed by: Morene Hoard MD 09/10/2024 02:49 AM EST RP Workstation: [id number]   DG Chest Port 1 View Result Date: 09/14/2024 EXAM: 1 VIEW(S) XRAY OF THE CHEST 09/21/2024 01:37:00 AM COMPARISON: 08/29/2024 CLINICAL HISTORY: cardiac arrest FINDINGS: LINES, TUBES AND DEVICES: Endotracheal tube in place with tip 4 cm above carina. Enteric tube in place with tip coursing into the stomach. Right chest wall Port-A-Cath in place with tip at cavoatrial junction. LUNGS AND PLEURA: Hazy airspace opacities most prominent in bilateral upper lung zones, suspicious for pneumonia, possibly on the basis of aspiration. No pleural effusion. No pneumothorax. HEART AND MEDIASTINUM: Unchanged mild cardiomegaly. Aortic valve replacement noted.  Atherosclerotic plaque noted. BONES AND SOFT TISSUES: No acute osseous abnormality. IMPRESSION: 1. Multifocal pneumonia, possibly on the basis of aspiration. 2. Endotracheal tube terminates 4 cm above the carina. Additional support apparatus as above. Electronically signed by: Pinkie Pebbles MD 09/13/2024 01:43 AM EST RP Workstation: [id number]     IO LINE INSERTION  Date/Time: 09/23/2024 8:35 AM  Performed by: Griselda Norris, MD Authorized by: Griselda Norris, MD   Consent:    Consent obtained:  Emergent situation Pre-procedure details:    Site preparation:  Chlorhexidine  Anesthesia:    Anesthesia method:  None Procedure details:    Insertion site:  L proximal tibia   Insertion device:  Drill device   Insertion: Needle was inserted  through the bony cortex     Number of attempts:  1   Insertion confirmation:  Aspiration of blood/marrow, easy infusion of fluids and stability of the needle Post-procedure details:    Secured with:  Transparent dressing   Procedure completion:  Tolerated well, no immediate complications    Medications Ordered in the ED  0.9 %  sodium chloride  infusion (0 mLs Intravenous Stopped 10/02/2024 0803)  acetaminophen  (TYLENOL ) tablet 650 mg (has no administration in time range)    Or  acetaminophen  (TYLENOL ) suppository 650 mg (has no administration in time range)  glycopyrrolate  (ROBINUL ) tablet 1 mg (has no administration in time range)    Or  glycopyrrolate  (ROBINUL ) injection 0.2 mg (has no administration in time range)    Or  glycopyrrolate  (ROBINUL ) injection 0.2 mg (has no administration in time range)  artificial tears ophthalmic solution 1 drop (has no administration in time range)  morphine  bolus via infusion 5 mg (5 mg Intravenous Bolus from Bag 09/07/2024 0633)  morphine  100mg  in NS (1mg /mL) infusion - premix (0 mg/hr Intravenous Stopped 10/02/2024 0803)  LORazepam  (ATIVAN ) injection 2-4 mg (has no administration in time range)  sodium  chloride 0.9 % bolus 1,000 mL (0 mLs Intravenous Stopped 09/15/2024 0802)  EPINEPHrine  (ADRENALIN ) 1 MG/10ML injection (0.5 mg Intravenous Given 10/04/2024 0127)  sodium bicarbonate  injection (50 mEq Intravenous Given 09/25/2024 0111)  levETIRAcetam  (KEPPRA ) undiluted injection 3,500 mg (3,500 mg Intravenous Given 09/14/2024 0219)  LORazepam  (ATIVAN ) injection 4 mg (4 mg Intravenous Given 09/16/2024 0219)   CRITICAL CARE Performed by: Almarie Burner   Total critical care time: 100 minutes  Critical care time was exclusive of separately billable procedures and treating other patients.  Critical care was necessary to treat or prevent imminent or life-threatening deterioration.  Critical care was time spent personally by me on the following activities: development of treatment plan with patient and/or surrogate as well as nursing, discussions with consultants, evaluation of patient's response to treatment, examination of patient, obtaining history from patient or surrogate, ordering and performing treatments and interventions, ordering and review of laboratory studies, ordering and review of radiographic studies, pulse oximetry and re-evaluation of patient's condition.                                  Medical Decision Making Amount and/or Complexity of Data Reviewed Labs: ordered. Radiology: ordered.  Risk Prescription drug management. Decision regarding hospitalization.   Pt with hx/o pancreatic cancer here with sob, had cardiac arrest on arrival to ED and CPR was initiated at time of ED arrival.  Please see nursing documentation regarding code.  Pt intubated by PA, supervised by myself. Pt had PEA arrest with ROSC and was started on epi drip for BP support.  CXR with multifocal pna and she was started on empiric abx.  Initially post arrest pt with GCS of 3, later developed facial twitching and neurology consulted - started on keppra .  Initial conversation with daughter, grandson was that pt would  want full treatment and care at this time.  On record review pt had been followed by palliative care with DNR/DNI desire (forms not available at this time).  POA not available for discussion at initial time of patient's ED arrival.  Critical care consulted for ongoing care.   At time of critical care arrival additional family and POA available with plan to transition to comfort care when family arrives.  Pt care transferred  pending ongoing care.       Final diagnoses:  Respiratory arrest [facility])    ED Discharge Orders     None          Griselda Norris, MD 10/06/2024 [phone]

## 2024-10-08 DEATH — deceased

## 2024-10-15 ENCOUNTER — Inpatient Hospital Stay

## 2024-10-28 ENCOUNTER — Ambulatory Visit: Admitting: Endocrinology

## 2024-12-14 ENCOUNTER — Ambulatory Visit: Admitting: Podiatry
